# Patient Record
Sex: Female | Born: 1935 | Race: White | Hispanic: No | Smoking: Never smoker
Health system: Southern US, Community
[De-identification: ages and names within clinical notes are randomized; demographics above are authoritative.]

## PROBLEM LIST (undated history)

## (undated) DIAGNOSIS — D649 Anemia, unspecified: Secondary | ICD-10-CM

## (undated) DIAGNOSIS — I429 Cardiomyopathy, unspecified: Secondary | ICD-10-CM

## (undated) DIAGNOSIS — I10 Essential (primary) hypertension: Secondary | ICD-10-CM

## (undated) DIAGNOSIS — A0839 Other viral enteritis: Secondary | ICD-10-CM

## (undated) DIAGNOSIS — F419 Anxiety disorder, unspecified: Secondary | ICD-10-CM

## (undated) DIAGNOSIS — C801 Malignant (primary) neoplasm, unspecified: Secondary | ICD-10-CM

## (undated) DIAGNOSIS — E785 Hyperlipidemia, unspecified: Secondary | ICD-10-CM

## (undated) DIAGNOSIS — K8020 Calculus of gallbladder without cholecystitis without obstruction: Secondary | ICD-10-CM

## (undated) DIAGNOSIS — E079 Disorder of thyroid, unspecified: Secondary | ICD-10-CM

## (undated) DIAGNOSIS — I82409 Acute embolism and thrombosis of unspecified deep veins of unspecified lower extremity: Secondary | ICD-10-CM

## (undated) DIAGNOSIS — I214 Non-ST elevation (NSTEMI) myocardial infarction: Secondary | ICD-10-CM

## (undated) DIAGNOSIS — I5022 Chronic systolic (congestive) heart failure: Secondary | ICD-10-CM

## (undated) DIAGNOSIS — E538 Deficiency of other specified B group vitamins: Secondary | ICD-10-CM

## (undated) DIAGNOSIS — K5190 Ulcerative colitis, unspecified, without complications: Secondary | ICD-10-CM

## (undated) DIAGNOSIS — I480 Paroxysmal atrial fibrillation: Secondary | ICD-10-CM

## (undated) DIAGNOSIS — B259 Cytomegaloviral disease, unspecified: Secondary | ICD-10-CM

## (undated) DIAGNOSIS — R3129 Other microscopic hematuria: Secondary | ICD-10-CM

## (undated) DIAGNOSIS — G459 Transient cerebral ischemic attack, unspecified: Secondary | ICD-10-CM

## (undated) HISTORY — PX: CARDIAC CATHETERIZATION: SHX172

## (undated) HISTORY — DX: Anxiety disorder, unspecified: F41.9

## (undated) HISTORY — DX: Chronic systolic (congestive) heart failure: I50.22

## (undated) HISTORY — DX: Transient cerebral ischemic attack, unspecified: G45.9

## (undated) HISTORY — DX: Disorder of thyroid, unspecified: E07.9

## (undated) HISTORY — DX: Anemia, unspecified: D64.9

## (undated) HISTORY — DX: Non-ST elevation (NSTEMI) myocardial infarction: I21.4

## (undated) HISTORY — DX: Cardiomyopathy, unspecified: I42.9

## (undated) HISTORY — DX: Malignant (primary) neoplasm, unspecified: C80.1

## (undated) HISTORY — PX: ABDOMINAL HYSTERECTOMY: SHX81

## (undated) HISTORY — DX: Calculus of gallbladder without cholecystitis without obstruction: K80.20

## (undated) HISTORY — DX: Hyperlipidemia, unspecified: E78.5

## (undated) HISTORY — DX: Other microscopic hematuria: R31.29

## (undated) HISTORY — DX: Acute embolism and thrombosis of unspecified deep veins of unspecified lower extremity: I82.409

## (undated) HISTORY — DX: Paroxysmal atrial fibrillation: I48.0

## (undated) HISTORY — DX: Ulcerative colitis, unspecified, without complications: K51.90

## (undated) HISTORY — DX: Essential (primary) hypertension: I10

## (undated) HISTORY — DX: Deficiency of other specified B group vitamins: E53.8

---

## 1997-05-23 HISTORY — PX: COLONOSCOPY: SHX174

## 1999-04-18 ENCOUNTER — Encounter: Payer: Self-pay | Admitting: Emergency Medicine

## 1999-04-18 ENCOUNTER — Encounter: Payer: Self-pay | Admitting: Internal Medicine

## 1999-04-18 ENCOUNTER — Inpatient Hospital Stay (HOSPITAL_COMMUNITY): Admission: EM | Admit: 1999-04-18 | Discharge: 1999-04-25 | Payer: Self-pay | Admitting: Emergency Medicine

## 1999-04-19 ENCOUNTER — Encounter: Payer: Self-pay | Admitting: Internal Medicine

## 1999-04-22 ENCOUNTER — Encounter: Payer: Self-pay | Admitting: Internal Medicine

## 1999-07-06 ENCOUNTER — Ambulatory Visit (HOSPITAL_COMMUNITY): Admission: RE | Admit: 1999-07-06 | Discharge: 1999-07-06 | Payer: Self-pay | Admitting: Psychiatry

## 2000-07-18 ENCOUNTER — Other Ambulatory Visit: Admission: RE | Admit: 2000-07-18 | Discharge: 2000-07-18 | Payer: Self-pay | Admitting: Obstetrics and Gynecology

## 2000-07-25 ENCOUNTER — Encounter: Payer: Self-pay | Admitting: Obstetrics and Gynecology

## 2000-07-25 ENCOUNTER — Ambulatory Visit (HOSPITAL_COMMUNITY): Admission: RE | Admit: 2000-07-25 | Discharge: 2000-07-25 | Payer: Self-pay | Admitting: Obstetrics and Gynecology

## 2002-09-11 ENCOUNTER — Encounter: Payer: Self-pay | Admitting: Family Medicine

## 2002-09-11 ENCOUNTER — Ambulatory Visit (HOSPITAL_COMMUNITY): Admission: RE | Admit: 2002-09-11 | Discharge: 2002-09-11 | Payer: Self-pay | Admitting: Family Medicine

## 2004-06-14 ENCOUNTER — Ambulatory Visit: Payer: Self-pay | Admitting: Family Medicine

## 2004-09-29 ENCOUNTER — Ambulatory Visit: Payer: Self-pay | Admitting: Family Medicine

## 2004-12-29 ENCOUNTER — Ambulatory Visit: Payer: Self-pay | Admitting: Family Medicine

## 2005-04-13 ENCOUNTER — Ambulatory Visit: Payer: Self-pay | Admitting: Family Medicine

## 2005-08-25 ENCOUNTER — Ambulatory Visit: Payer: Self-pay | Admitting: Family Medicine

## 2005-10-06 ENCOUNTER — Ambulatory Visit: Payer: Self-pay | Admitting: Family Medicine

## 2006-02-07 ENCOUNTER — Ambulatory Visit: Payer: Self-pay | Admitting: Family Medicine

## 2006-07-31 ENCOUNTER — Emergency Department (HOSPITAL_COMMUNITY): Admission: EM | Admit: 2006-07-31 | Discharge: 2006-07-31 | Payer: Self-pay | Admitting: Emergency Medicine

## 2006-11-08 ENCOUNTER — Ambulatory Visit: Payer: Self-pay | Admitting: Family Medicine

## 2006-11-08 LAB — CONVERTED CEMR LAB
AST: 26 units/L (ref 0–37)
Bilirubin, Direct: 0.2 mg/dL (ref 0.0–0.3)
Eosinophils Absolute: 0.2 10*3/uL (ref 0.0–0.6)
Eosinophils Relative: 3 % (ref 0.0–5.0)
GFR calc Af Amer: 77 mL/min
GFR calc non Af Amer: 64 mL/min
Glucose, Bld: 126 mg/dL — ABNORMAL HIGH (ref 70–99)
HCT: 36.9 % (ref 36.0–46.0)
Hgb A1c MFr Bld: 6.5 % — ABNORMAL HIGH (ref 4.6–6.0)
Lymphocytes Relative: 19.8 % (ref 12.0–46.0)
MCV: 86.6 fL (ref 78.0–100.0)
Neutro Abs: 5.6 10*3/uL (ref 1.4–7.7)
Neutrophils Relative %: 70.4 % (ref 43.0–77.0)
Sodium: 143 meq/L (ref 135–145)
TSH: 0.68 microintl units/mL (ref 0.35–5.50)
WBC: 7.8 10*3/uL (ref 4.5–10.5)

## 2006-11-10 DIAGNOSIS — I10 Essential (primary) hypertension: Secondary | ICD-10-CM | POA: Insufficient documentation

## 2006-11-10 DIAGNOSIS — D649 Anemia, unspecified: Secondary | ICD-10-CM | POA: Insufficient documentation

## 2006-11-10 DIAGNOSIS — E785 Hyperlipidemia, unspecified: Secondary | ICD-10-CM | POA: Insufficient documentation

## 2006-11-10 DIAGNOSIS — E039 Hypothyroidism, unspecified: Secondary | ICD-10-CM | POA: Insufficient documentation

## 2007-04-02 ENCOUNTER — Ambulatory Visit: Payer: Self-pay | Admitting: Family Medicine

## 2007-04-25 ENCOUNTER — Telehealth: Payer: Self-pay | Admitting: Family Medicine

## 2007-06-19 ENCOUNTER — Ambulatory Visit: Payer: Self-pay | Admitting: Family Medicine

## 2007-08-13 ENCOUNTER — Ambulatory Visit: Payer: Self-pay | Admitting: Family Medicine

## 2007-09-10 ENCOUNTER — Emergency Department (HOSPITAL_COMMUNITY): Admission: EM | Admit: 2007-09-10 | Discharge: 2007-09-10 | Payer: Self-pay | Admitting: Emergency Medicine

## 2007-09-28 ENCOUNTER — Telehealth: Payer: Self-pay | Admitting: Family Medicine

## 2007-10-08 ENCOUNTER — Ambulatory Visit: Payer: Self-pay

## 2007-10-08 ENCOUNTER — Encounter (INDEPENDENT_AMBULATORY_CARE_PROVIDER_SITE_OTHER): Payer: Self-pay | Admitting: Neurology

## 2007-10-25 ENCOUNTER — Encounter: Admission: RE | Admit: 2007-10-25 | Discharge: 2007-10-25 | Payer: Self-pay | Admitting: Neurology

## 2007-12-04 ENCOUNTER — Telehealth: Payer: Self-pay | Admitting: Family Medicine

## 2007-12-26 ENCOUNTER — Ambulatory Visit: Payer: Self-pay | Admitting: Family Medicine

## 2007-12-26 DIAGNOSIS — I4891 Unspecified atrial fibrillation: Secondary | ICD-10-CM

## 2007-12-26 DIAGNOSIS — G459 Transient cerebral ischemic attack, unspecified: Secondary | ICD-10-CM | POA: Insufficient documentation

## 2007-12-26 DIAGNOSIS — I509 Heart failure, unspecified: Secondary | ICD-10-CM | POA: Insufficient documentation

## 2007-12-26 DIAGNOSIS — G7000 Myasthenia gravis without (acute) exacerbation: Secondary | ICD-10-CM | POA: Insufficient documentation

## 2007-12-26 DIAGNOSIS — F411 Generalized anxiety disorder: Secondary | ICD-10-CM | POA: Insufficient documentation

## 2008-01-23 ENCOUNTER — Telehealth: Payer: Self-pay | Admitting: Family Medicine

## 2008-02-08 ENCOUNTER — Ambulatory Visit: Payer: Self-pay | Admitting: Family Medicine

## 2008-02-26 ENCOUNTER — Telehealth: Payer: Self-pay | Admitting: Family Medicine

## 2008-04-09 ENCOUNTER — Encounter: Payer: Self-pay | Admitting: Family Medicine

## 2008-06-25 ENCOUNTER — Ambulatory Visit: Payer: Self-pay | Admitting: Family Medicine

## 2008-09-05 ENCOUNTER — Telehealth: Payer: Self-pay | Admitting: Family Medicine

## 2008-10-10 ENCOUNTER — Ambulatory Visit: Payer: Self-pay | Admitting: Family Medicine

## 2008-10-14 ENCOUNTER — Encounter: Payer: Self-pay | Admitting: Family Medicine

## 2008-12-18 ENCOUNTER — Encounter: Admission: RE | Admit: 2008-12-18 | Discharge: 2008-12-18 | Payer: Self-pay | Admitting: Family Medicine

## 2009-02-04 ENCOUNTER — Telehealth: Payer: Self-pay | Admitting: Family Medicine

## 2009-02-17 ENCOUNTER — Ambulatory Visit: Payer: Self-pay | Admitting: Family Medicine

## 2009-03-11 ENCOUNTER — Encounter: Payer: Self-pay | Admitting: Family Medicine

## 2009-04-07 ENCOUNTER — Encounter (INDEPENDENT_AMBULATORY_CARE_PROVIDER_SITE_OTHER): Payer: Self-pay | Admitting: *Deleted

## 2009-04-10 ENCOUNTER — Encounter (INDEPENDENT_AMBULATORY_CARE_PROVIDER_SITE_OTHER): Payer: Self-pay | Admitting: *Deleted

## 2009-04-15 ENCOUNTER — Encounter (INDEPENDENT_AMBULATORY_CARE_PROVIDER_SITE_OTHER): Payer: Self-pay | Admitting: *Deleted

## 2009-04-29 ENCOUNTER — Ambulatory Visit: Payer: Self-pay | Admitting: Family Medicine

## 2009-05-05 LAB — CONVERTED CEMR LAB: TSH: 4.28 microintl units/mL (ref 0.35–5.50)

## 2009-06-05 ENCOUNTER — Telehealth: Payer: Self-pay | Admitting: Family Medicine

## 2009-07-27 ENCOUNTER — Telehealth: Payer: Self-pay | Admitting: Family Medicine

## 2009-08-04 ENCOUNTER — Ambulatory Visit: Payer: Self-pay | Admitting: Family Medicine

## 2009-09-22 ENCOUNTER — Ambulatory Visit: Payer: Self-pay | Admitting: Family Medicine

## 2009-12-02 ENCOUNTER — Ambulatory Visit: Payer: Self-pay | Admitting: Family Medicine

## 2010-01-27 ENCOUNTER — Telehealth: Payer: Self-pay | Admitting: Family Medicine

## 2010-02-22 ENCOUNTER — Ambulatory Visit: Payer: Self-pay | Admitting: Family Medicine

## 2010-02-22 ENCOUNTER — Encounter: Payer: Self-pay | Admitting: Family Medicine

## 2010-02-22 DIAGNOSIS — E538 Deficiency of other specified B group vitamins: Secondary | ICD-10-CM

## 2010-02-25 LAB — CONVERTED CEMR LAB
ALT: 23 units/L (ref 0–35)
AST: 23 units/L (ref 0–37)
BUN: 20 mg/dL (ref 6–23)
Basophils Relative: 0.1 % (ref 0.0–3.0)
Chloride: 104 meq/L (ref 96–112)
Eosinophils Relative: 2.4 % (ref 0.0–5.0)
GFR calc non Af Amer: 58.89 mL/min (ref >60)
HCT: 34 % — ABNORMAL LOW (ref 36.0–46.0)
HDL: 41 mg/dL (ref >39.00)
Hemoglobin: 11.7 g/dL — ABNORMAL LOW (ref 12.0–15.0)
Lymphs Abs: 1.2 10*3/uL (ref 0.7–4.0)
MCV: 88 fL (ref 78.0–100.0)
Monocytes Absolute: 0.5 10*3/uL (ref 0.1–1.0)
Monocytes Relative: 8 % (ref 3.0–12.0)
Neutro Abs: 4.2 10*3/uL (ref 1.4–7.7)
Potassium: 4.3 meq/L (ref 3.5–5.1)
RBC: 3.86 M/uL — ABNORMAL LOW (ref 3.87–5.11)
Sodium: 141 meq/L (ref 135–145)
TSH: 3.15 microintl units/mL (ref 0.35–5.50)
Total Bilirubin: 1.1 mg/dL (ref 0.3–1.2)
Total Protein: 6.7 g/dL (ref 6.0–8.3)
VLDL: 58.4 mg/dL — ABNORMAL HIGH (ref 0.0–40.0)
Vitamin B-12: >1500 pg/mL — ABNORMAL HIGH (ref 211–911)
WBC: 6 10*3/uL (ref 4.5–10.5)

## 2010-03-31 ENCOUNTER — Ambulatory Visit: Payer: Self-pay | Admitting: Family Medicine

## 2010-04-20 ENCOUNTER — Encounter: Payer: Self-pay | Admitting: Family Medicine

## 2010-04-30 ENCOUNTER — Encounter
Admission: RE | Admit: 2010-04-30 | Discharge: 2010-04-30 | Payer: Self-pay | Source: Home / Self Care | Attending: Family Medicine | Admitting: Family Medicine

## 2010-04-30 ENCOUNTER — Encounter: Payer: Self-pay | Admitting: Family Medicine

## 2010-05-05 ENCOUNTER — Telehealth: Payer: Self-pay | Admitting: Family Medicine

## 2010-06-19 ENCOUNTER — Encounter: Payer: Self-pay | Admitting: Family Medicine

## 2010-06-20 LAB — CONVERTED CEMR LAB
ALT: 22 units/L (ref 0–35)
ALT: 26 units/L (ref 0–35)
AST: 24 units/L (ref 0–37)
AST: 27 units/L (ref 0–37)
Albumin: 3.7 g/dL (ref 3.5–5.2)
BUN: 10 mg/dL (ref 6–23)
Basophils Absolute: 0 10*3/uL (ref 0.0–0.1)
Basophils Relative: 0.2 % (ref 0.0–3.0)
Basophils Relative: 0.4 % (ref 0.0–3.0)
Bilirubin, Direct: 0.1 mg/dL (ref 0.0–0.3)
CO2: 25 meq/L (ref 19–32)
CO2: 30 meq/L (ref 19–32)
Calcium: 9.4 mg/dL (ref 8.4–10.5)
Calcium: 9.5 mg/dL (ref 8.4–10.5)
Cholesterol: 126 mg/dL (ref 0–200)
Creatinine, Ser: 0.9 mg/dL (ref 0.4–1.2)
Creatinine, Ser: 0.9 mg/dL (ref 0.4–1.2)
Eosinophils Relative: 2.9 % (ref 0.0–5.0)
Eosinophils Relative: 3 % (ref 0.0–5.0)
GFR calc non Af Amer: 63.6 mL/min (ref >60)
Glucose, Bld: 106 mg/dL — ABNORMAL HIGH (ref 70–99)
Glucose, Bld: 120 mg/dL — ABNORMAL HIGH (ref 70–99)
HCT: 33 % — ABNORMAL LOW (ref 36.0–46.0)
Hemoglobin: 11.8 g/dL — ABNORMAL LOW (ref 12.0–15.0)
LDL Cholesterol: 63 mg/dL (ref 0–99)
Lymphocytes Relative: 18.6 % (ref 12.0–46.0)
Lymphs Abs: 1.1 10*3/uL (ref 0.7–4.0)
MCHC: 34.2 g/dL (ref 30.0–36.0)
MCV: 90 fL (ref 78.0–100.0)
Monocytes Absolute: 0.4 10*3/uL (ref 0.1–1.0)
Monocytes Relative: 7.7 % (ref 3.0–12.0)
Neutro Abs: 4.9 10*3/uL (ref 1.4–7.7)
Pro B Natriuretic peptide (BNP): 120 pg/mL — ABNORMAL HIGH (ref 0.0–100.0)
RBC: 3.67 M/uL — ABNORMAL LOW (ref 3.87–5.11)
RBC: 4.01 M/uL (ref 3.87–5.11)
Sodium: 145 meq/L (ref 135–145)
TSH: 11.32 microintl units/mL — ABNORMAL HIGH (ref 0.35–5.50)
Total CHOL/HDL Ratio: 4
Total Protein: 6.4 g/dL (ref 6.0–8.3)
Total Protein: 6.6 g/dL (ref 6.0–8.3)
Vitamin B-12: 169 pg/mL — ABNORMAL LOW (ref 211–911)
WBC: 5.3 10*3/uL (ref 4.5–10.5)
WBC: 7 10*3/uL (ref 4.5–10.5)

## 2010-06-22 NOTE — Progress Notes (Signed)
Summary: [facility]   Imported By: Sherian Rein 10/31/2009 08:52:35  _____________________________________________________________________  External Attachment:    Type:   Image     Comment:   External Document

## 2010-06-22 NOTE — Progress Notes (Signed)
Summary: refill alprazolam  Phone Note From Pharmacy   Caller: [facility] [address] [address]* Summary of Call: refill alprazolam 1mg   Initial call taken by: Pura Spice, RN,  January 27, 2010 12:51 PM  Follow-up for Phone Call        call in #90 with 5 rf Follow-up by: Nelwyn Salisbury MD,  January 27, 2010 2:38 PM    Prescriptions: Prudy Feeler 0.5 MG TABS (ALPRAZOLAM) Take 1 tablet by mouth three times a day  #90 x 5   Entered by:   Lucious Groves CMA   Authorized by:   Nelwyn Salisbury MD   Signed by:   Lucious Groves CMA on 01/27/2010   Method used:   Telephoned to ...       [facility] [address] (retail)       [address] [place], [place]  [address]       Ph: [phone]       Fax: [phone]   RxID:   [id number]   Appended Document: refill alprazolam  1 mg  ok  [facility] and [facility] to [facility] and alprazolam 1mg  #90 with 5 refills called also informed her to disregard and not fill the 0.5mg  alopazolam . ..Marland Kitchengh rn.......Marland Kitchen

## 2010-06-22 NOTE — Assessment & Plan Note (Signed)
Summary: B-12 INJ/CJR   Nurse Visit   Allergies: 1)  Lasix (Furosemide) 2)  Sulfamethoxazole (Sulfamethoxazole)  Medication Administration  Injection # 1:    Medication: Vit B12 1000 mcg    Diagnosis: ANEMIA-NOS (ICD-285.9)    Route: IM    Site: L deltoid    Exp Date: 09/12    Lot #: [id number]    Mfr: [employer]    Patient tolerated injection without complications    Given by: Raechel Ache, RN (Sep 22, 2009 1:49 PM)  Orders Added: 1)  Vit B12 1000 mcg [J3420] 2)  Admin of Therapeutic Inj  intramuscular or subcutaneous [96045]

## 2010-06-22 NOTE — Assessment & Plan Note (Signed)
Summary: B12 INJ//CCM   Nurse Visit   Allergies: 1)  Lasix (Furosemide) 2)  Sulfamethoxazole (Sulfamethoxazole)  Medication Administration  Injection # 1:    Medication: Vit B12 1000 mcg    Diagnosis: ANEMIA-NOS (ICD-285.9)    Route: IM    Site: L deltoid    Exp Date: 09/12    Lot #: [id number]    Mfr: [employer]    Patient tolerated injection without complications    Given by: Raechel Ache, RN (August 04, 2009 1:52 PM)  Orders Added: 1)  Vit B12 1000 mcg [J3420] 2)  Admin of Therapeutic Inj  intramuscular or subcutaneous [96045]

## 2010-06-22 NOTE — Progress Notes (Signed)
Summary: Alprazolam  Phone Note From Pharmacy   Summary of Call: Pharmacy called stating that patient has been on 1mg  of Alprazolam. We called in 0.5mg  b/c that is what was in the chart. Should the patient receive 0.5mg  or 1mg . Please advise. Initial call taken by: Lucious Groves CMA,  January 27, 2010 3:22 PM  Follow-up for Phone Call        call in a full 1 mg as in my note please Follow-up by: Nelwyn Salisbury MD,  January 27, 2010 4:56 PM  Additional Follow-up for Phone Call Additional follow up Details #1::        done yest pt aware Additional Follow-up by: Pura Spice, RN,  January 28, 2010 8:47 AM

## 2010-06-22 NOTE — Assessment & Plan Note (Signed)
Summary: b12 inj/njr   Nurse Visit   Allergies: 1)  Lasix (Furosemide) 2)  Sulfamethoxazole (Sulfamethoxazole)  Medication Administration  Injection # 1:    Medication: Vit B12 1000 mcg    Diagnosis: VITAMIN B12 DEFICIENCY (ICD-266.2)    Route: IM    Site: L deltoid    Exp Date: 11/2011    Lot #: [id number]    Mfr: [employer]    Patient tolerated injection without complications    Given by: Pura Spice, RN (March 31, 2010 3:03 PM)  Orders Added: 1)  Vit B12 1000 mcg [J3420] 2)  Admin of Therapeutic Inj  intramuscular or subcutaneous [56213]

## 2010-06-22 NOTE — Assessment & Plan Note (Signed)
Summary: b 12 inj/njr   Nurse Visit   Allergies: 1)  Lasix (Furosemide) 2)  Sulfamethoxazole (Sulfamethoxazole)  Medication Administration  Injection # 1:    Medication: Vit B12 1000 mcg    Diagnosis: ANEMIA-NOS (ICD-285.9)    Route: IM    Site: L deltoid    Exp Date: 02/13    Lot #: [id number]    Mfr: [employer]    Patient tolerated injection without complications    Given by: Raechel Ache, RN (December 02, 2009 1:53 PM)  Orders Added: 1)  Vit B12 1000 mcg [J3420] 2)  Admin of Therapeutic Inj  intramuscular or subcutaneous [[address]]

## 2010-06-22 NOTE — Progress Notes (Signed)
Summary: REFILL  Phone Note Refill Request Message from:  Fax from Pharmacy  Refills Requested: Medication #1:  LIPITOR 20 MG TABS Take 1 tablet by mouth once a day [facility] Fransisca Kaufmann [id number]     [phone]  Initial call taken by: Warnell Forester,  July 27, 2009 9:14 AM    Prescriptions: LIPITOR 20 MG TABS (ATORVASTATIN CALCIUM) Take 1 tablet by mouth once a day  #30 Tablet x 10   Entered by:   Raechel Ache, RN   Authorized by:   Nelwyn Salisbury MD   Signed by:   Raechel Ache, RN on 07/27/2009   Method used:   Electronically to        [facility] [address] (retail)       [address] [place], [place]  [address]       Ph: [phone]       Fax: [phone]   RxID:   [id number]

## 2010-06-22 NOTE — Assessment & Plan Note (Signed)
Summary: CPX (PT WILL COME IN FASTING) // RS   Vital Signs:  Patient profile:   77 year old female Height:      65 inches Weight:      231 pounds BMI:     38.58 O2 Sat:      96 % Temp:     98.2 degrees F Pulse rate:   82 / minute BP sitting:   140 / 82  (left arm) Cuff size:   large  Vitals Entered By: Pura Spice, RN (February 22, 2010 1:56 PM)  History of Present Illness: 77 yr old female for a cpx. She feels fine in general with no complaints.   Allergies: 1)  Lasix (Furosemide) 2)  Sulfamethoxazole (Sulfamethoxazole)  Past History:  Past Medical History: Reviewed history from 04/29/2009 and no changes required. Anemia-NOS Hyperlipidemia Hypertension Hypothyroidism Myasthenia gravis, sees Dr. Thad Ranger Anxiety paroxysmal Atrial fibrillation B12 deficiency Congestive heart failure TIA in 4-09 benign microscopic hematuria, worked up by Dr. 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 Birdie Sons  Past Surgical History: Reviewed history from 12/26/2007 and no changes required. cardiac cath  12/00 Hysterectomy Oophorectomy colonoscopy 1999, normal  Family History: Reviewed history from 12/26/2007 and no changes required. unremarkable, her family lives in [place]  Social History: Reviewed history from 12/26/2007 and no changes required. Widow/Widower Never Smoked Alcohol use-yes Drug use-no  Review of Systems  The patient denies anorexia, fever, weight loss, weight gain, vision loss, decreased hearing, hoarseness, chest pain, syncope, dyspnea on exertion, peripheral edema, prolonged cough, headaches, hemoptysis, abdominal pain, melena, hematochezia, severe indigestion/heartburn, hematuria, incontinence, genital sores, muscle weakness, suspicious skin lesions, transient blindness, difficulty walking, depression, unusual weight change, abnormal bleeding, enlarged lymph nodes, angioedema, breast masses, and testicular  masses.         Flu Vaccine Consent Questions     Do you have a history of severe allergic reactions to this vaccine? no    Any prior history of allergic reactions to egg and/or gelatin? no    Do you have a sensitivity to the preservative Thimersol? no    Do you have a past history of Guillan-Barre Syndrome? no    Do you currently have an acute febrile illness? no    Have you ever had a severe reaction to latex? no    Vaccine information given and explained to patient? yes    Are you currently pregnant? no    Lot Number:[id number]   Exp Date:11/20/2010   Site Given  Left Deltoid IM Pura Spice, RN  February 22, 2010 1:57 PM   Physical Exam  General:  overweight-appearing.  uses a rolling walker Head:  Normocephalic and atraumatic without obvious abnormalities. No apparent alopecia or balding. Eyes:  No corneal or conjunctival inflammation noted. EOMI. Perrla. Funduscopic exam benign, without hemorrhages, exudates or papilledema. Vision grossly normal. Ears:  External ear exam shows no significant lesions or deformities.  Otoscopic examination reveals clear canals, tympanic membranes are intact bilaterally without bulging, retraction, inflammation or discharge. Hearing is grossly normal bilaterally. Nose:  External nasal examination shows no deformity or inflammation. Nasal mucosa are pink and moist without lesions or exudates. Mouth:  Oral mucosa and oropharynx without lesions or exudates.  Teeth in good repair. Neck:  No deformities, masses, or tenderness noted. Chest Wall:  No deformities, masses, or tenderness noted. Breasts:  No mass, nodules, thickening, tenderness, bulging, retraction, inflamation, nipple discharge or skin changes noted.  Lungs:  Normal respiratory effort, chest expands symmetrically. Lungs are clear to auscultation, no crackles or wheezes. Heart:  Normal rate and regular rhythm. S1 and S2 normal without gallop, murmur, click, rub or other extra sounds. EKG  normal Abdomen:  Bowel sounds positive,abdomen soft and non-tender without masses, organomegaly or hernias noted. Msk:  No deformity or scoliosis noted of thoracic or lumbar spine.   Pulses:  R and L carotid,radial,femoral,dorsalis pedis and posterior tibial pulses are full and equal bilaterally Extremities:  No clubbing, cyanosis, edema, or deformity noted with normal full range of motion of all joints.   Neurologic:  No cranial nerve deficits noted. Station and gait are normal. Plantar reflexes are down-going bilaterally. DTRs are symmetrical throughout. Sensory, motor and coordinative functions appear intact. Skin:  Intact without suspicious lesions or rashes Cervical Nodes:  No lymphadenopathy noted Axillary Nodes:  No palpable lymphadenopathy Inguinal Nodes:  No significant adenopathy Psych:  Cognition and judgment appear intact. Alert and cooperative with normal attention span and concentration. No apparent delusions, illusions, hallucinations   Impression & Recommendations:  Problem # 1:  MYASTHENIA GRAVIS WITHOUT EXACERBATION (ICD-358.00)  Problem # 2:  CONGESTIVE HEART FAILURE (ICD-428.0)  Her updated medication list for this problem includes:    Plavix 75 Mg Tabs (Clopidogrel bisulfate) .Marland Kitchen... Take 1 tab by mouth daily    Avalide 300-12.5 Mg Tabs (Irbesartan-hydrochlorothiazide) ..... Once daily  Problem # 3:  ATRIAL FIBRILLATION (ICD-427.31)  Her updated medication list for this problem includes:    Plavix 75 Mg Tabs (Clopidogrel bisulfate) .Marland Kitchen... Take 1 tab by mouth daily  Problem # 4:  HYPOTHYROIDISM (ICD-244.9)  Her updated medication list for this problem includes:    Levoxyl 125 Mcg Tabs (Levothyroxine sodium) .Marland Kitchen... Take 1 tablet by mouth once a day  Problem # 5:  HYPERTENSION (ICD-401.9)  Her updated medication list for this problem includes:    Avalide 300-12.5 Mg Tabs (Irbesartan-hydrochlorothiazide) ..... Once daily  Orders: EKG w/ Interpretation (93000) UA  Dipstick w/Micro (automated) (81001) Venipuncture (56213) TLB-Lipid Panel (80061-LIPID) TLB-BMP (Basic Metabolic Panel-BMET) (80048-METABOL) TLB-CBC Platelet - w/Differential (85025-CBCD) TLB-Hepatic/Liver Function Pnl (80076-HEPATIC) TLB-TSH (Thyroid Stimulating Hormone) (84443-TSH)  Problem # 6:  HYPERLIPIDEMIA (ICD-272.4)  Her updated medication list for this problem includes:    Lipitor 20 Mg Tabs (Atorvastatin calcium) .Marland Kitchen... Take 1 tablet by mouth once a day  Problem # 7:  VITAMIN B12 DEFICIENCY (ICD-266.2)  Orders: TLB-B12, Serum-Total ONLY (08657-Q46)  Complete Medication List: 1)  Cellcept 500 Mg Tabs (Mycophenolate mofetil) .... Take 2 twice a day 2)  Cyanocobalamin 1000 Mcg/ml Soln (Cyanocobalamin) .... Give once a month 3)  Levoxyl 125 Mcg Tabs (Levothyroxine sodium) .... Take 1 tablet by mouth once a day 4)  Lipitor 20 Mg Tabs (Atorvastatin calcium) .... Take 1 tablet by mouth once a day 5)  Plavix 75 Mg Tabs (Clopidogrel bisulfate) .... Take 1 tab by mouth daily 6)  Avalide 300-12.5 Mg Tabs (Irbesartan-hydrochlorothiazide) .... Once daily 7)  Alprazolam 1 Mg Tabs (Alprazolam) .Marland Kitchen.. 1 by mouth three times a day  Other Orders: Flu Vaccine 27yrs + MEDICARE PATIENTS (N6295) Administration Flu vaccine - MCR (G0008) Vit B12 1000 mcg (J3420) Admin of Therapeutic Inj  intramuscular or subcutaneous (28413)  Patient Instructions: 1)  It is important that you exercise reguarly at least 20 minutes 5 times a week. If you develop chest pain, have severe difficulty breathing, or feel very tired, stop exercising immediately and seek medical attention.  2)  You need to lose weight.  Consider a lower calorie diet and regular exercise.  3)  Schedule your mammogram.  4)  get labs today  5)  get a DEXA Prescriptions: AVALIDE 300-12.5 MG TABS (IRBESARTAN-HYDROCHLOROTHIAZIDE) once daily  #30 x 11   Entered and Authorized by:   Nelwyn Salisbury MD   Signed by:   Nelwyn Salisbury MD on  02/22/2010   Method used:   Electronically to        [facility] [address] (retail)       [address] [place], [place]  [address]       Ph: [phone]       Fax: [phone]   RxID:   [id number] LEVOXYL 125 MCG  TABS (LEVOTHYROXINE SODIUM) Take 1 tablet by mouth once a day  #30 x 11   Entered and Authorized by:   Nelwyn Salisbury MD   Signed by:   Nelwyn Salisbury MD on 02/22/2010   Method used:   Electronically to        [facility] [address] (retail)       [address] [place], [place]  [address]       Ph: [phone]       Fax: [phone]   RxID:   [id number]    Medication Administration  Injection # 1:    Medication: Vit B12 1000 mcg    Diagnosis: ANEMIA-NOS (ICD-285.9)    Route: IM    Site: R deltoid    Exp Date: 08/2011    Lot #: [id number]    Mfr: [employer]    Patient tolerated injection without complications    Given by: Pura Spice, RN (February 22, 2010 1:59 PM)  Orders Added: 1)  Flu Vaccine 27yrs + MEDICARE PATIENTS [Q2039] 2)  Administration Flu vaccine - MCR [G0008] 3)  Vit B12 1000 mcg [J3420] 4)  Admin of Therapeutic Inj  intramuscular or subcutaneous [96372] 5)  Est. Patient Level IV [63016] 6)  EKG w/ Interpretation [93000] 7)  UA Dipstick w/Micro (automated) [81001] 8)  Venipuncture [36415] 9)  TLB-Lipid Panel [80061-LIPID] 10)  TLB-BMP (Basic Metabolic Panel-BMET) [80048-METABOL] 11)  TLB-CBC Platelet - w/Differential [85025-CBCD] 12)  TLB-Hepatic/Liver Function Pnl [80076-HEPATIC] 13)  TLB-TSH (Thyroid Stimulating Hormone) [84443-TSH] 14)  TLB-B12, Serum-Total ONLY [82607-B12]    Appended Document: Orders Update     Clinical Lists Changes  Orders: Added new Service order of Specimen Handling (01093) - Signed      Appended Document: CPX (PT WILL COME IN FASTING) // RS  Laboratory Results   Urine Tests    Routine Urinalysis    Color: yellow Appearance: Clear Glucose: negative   (Normal Range: Negative) Bilirubin: negative   (Normal Range: Negative) Ketone: negative   (Normal Range: Negative) Spec. Gravity: <1.005   (Normal Range: 1.003-1.035) Blood: 2+   (Normal Range: Negative) pH: 5.0   (Normal Range: 5.0-8.0) Protein: negative   (Normal Range: Negative) Urobilinogen: 0.2   (Normal Range: 0-1) Nitrite: negative   (Normal Range: Negative) Leukocyte Esterace: trace   (Normal Range: Negative)    Comments: Rita Ohara  February 22, 2010 3:05 PM

## 2010-06-22 NOTE — Progress Notes (Signed)
Summary: refill xanax  Phone Note From Pharmacy   Caller: [facility] [address] [address]* Call For: Dorse Locy  Summary of Call: refill xanax 1mg  1 by mouth three times a day Initial call taken by: Alfred Levins, CMA,  June 05, 2009 2:36 PM  Follow-up for Phone Call        she should have refills available from 01-2009 Follow-up by: Nelwyn Salisbury MD,  June 05, 2009 3:56 PM  Additional Follow-up for Phone Call Additional follow up Details #1::        last refill was on 05/01/09 Additional Follow-up by: Alfred Levins, CMA,  June 05, 2009 4:02 PM    Additional Follow-up for Phone Call Additional follow up Details #2::    okay. Call in #90 with 5 rf Follow-up by: Nelwyn Salisbury MD,  June 05, 2009 4:10 PM  Additional Follow-up for Phone Call Additional follow up Details #3:: Details for Additional Follow-up Action Taken: rx called in Additional Follow-up by: Alfred Levins, CMA,  June 05, 2009 4:15 PM

## 2010-06-24 NOTE — Progress Notes (Signed)
Summary: returning a call  Phone Note Call from Patient Call back at Home Phone [phone]   Caller: Patient---triage vm Summary of Call: returning a call. Initial call taken by: Warnell Forester,  May 05, 2010 1:03 PM    New/Updated Medications: ACTONEL 150 MG TABS (RISEDRONATE SODIUM) one pill monthly Prescriptions: ACTONEL 150 MG TABS (RISEDRONATE SODIUM) one pill monthly  #4 x 3   Entered by:   Lynann Beaver CMA AAMA   Authorized by:   Nelwyn Salisbury MD   Signed by:   Lynann Beaver CMA AAMA on 05/05/2010   Method used:   Electronically to        [facility] [address] (retail)       [address] [place], [place]  [address]       Ph: [phone]       Fax: [phone]   RxID:   [id number]  Pt notified.

## 2010-06-29 ENCOUNTER — Ambulatory Visit (INDEPENDENT_AMBULATORY_CARE_PROVIDER_SITE_OTHER): Payer: Medicare Other | Admitting: Family Medicine

## 2010-06-29 DIAGNOSIS — D519 Vitamin B12 deficiency anemia, unspecified: Secondary | ICD-10-CM

## 2010-06-29 DIAGNOSIS — D518 Other vitamin B12 deficiency anemias: Secondary | ICD-10-CM

## 2010-06-29 MED ORDER — CYANOCOBALAMIN 1000 MCG/ML IJ SOLN
1000.0000 ug | INTRAMUSCULAR | Status: DC
  Administered 2010-06-29 – 2011-08-01 (×2): 1000 ug via INTRAMUSCULAR

## 2010-08-02 ENCOUNTER — Ambulatory Visit (INDEPENDENT_AMBULATORY_CARE_PROVIDER_SITE_OTHER): Payer: Medicare Other | Admitting: Family Medicine

## 2010-08-02 DIAGNOSIS — D519 Vitamin B12 deficiency anemia, unspecified: Secondary | ICD-10-CM

## 2010-08-02 DIAGNOSIS — D518 Other vitamin B12 deficiency anemias: Secondary | ICD-10-CM

## 2010-08-02 MED ORDER — CYANOCOBALAMIN 1000 MCG/ML IJ SOLN
1000.0000 ug | Freq: Once | INTRAMUSCULAR | Status: AC
  Administered 2010-08-02: 1000 ug via INTRAMUSCULAR

## 2010-09-06 ENCOUNTER — Ambulatory Visit (INDEPENDENT_AMBULATORY_CARE_PROVIDER_SITE_OTHER): Payer: Medicare Other | Admitting: Family Medicine

## 2010-09-06 DIAGNOSIS — D518 Other vitamin B12 deficiency anemias: Secondary | ICD-10-CM

## 2010-09-06 DIAGNOSIS — D519 Vitamin B12 deficiency anemia, unspecified: Secondary | ICD-10-CM

## 2010-09-06 MED ORDER — CYANOCOBALAMIN 1000 MCG/ML IJ SOLN
1000.0000 ug | Freq: Once | INTRAMUSCULAR | Status: AC
  Administered 2010-09-06: 1000 ug via INTRAMUSCULAR

## 2010-09-24 ENCOUNTER — Other Ambulatory Visit: Payer: Self-pay

## 2010-09-24 MED ORDER — ATORVASTATIN CALCIUM 20 MG PO TABS: 20.0000 mg | ORAL_TABLET | Freq: Every day | ORAL | Status: DC

## 2010-09-24 NOTE — Telephone Encounter (Signed)
rx sent to [facility] [place] for atorvastatin 20 mg

## 2010-10-05 NOTE — Assessment & Plan Note (Signed)
[facility] OFFICE NOTE   [name], [name]                    MRN:          [id number]  DATE:11/08/2006                            DOB:          September 14, 1924    This is an 77 year old woman here for a complete physical examination.  In general, she is doing fairly well, but I think medications may need  to be adjusted.  Over the past 6 months, her blood pressure has been  running a little higher than usual, although she has felt reasonably  well.  She also has not had a B12 shot in the past 3 months and realizes  she needs to get back on a regular regimen.  Also, she has been on Xanax  for years for anxiety.  Over the last year, it seems not to work as well  as it used to.  She has had some increased episodes of anxiety lately,  although she has been sleeping fairly well.  As noted in her chart, she  has a history of paroxysmal atrial fibrillation for many years.  She is  never symptomatic with it, however.  She specifically denies shortness  of breath, chest pain, or palpitations.  For other details of her past  medical history, family history, social history, habits, [name], I  refer you to our last physical note dated Sep 29, 2004.   ALLERGIES:  LASIX and SULFA.   CURRENT MEDICATIONS:  1. B12 shots every 1 to 2 months.  2. Levoxyl 175 mcg per day.  3. Cellcept 1000 mg b.i.d.  4. Lipitor 20 mg per day.  5. Avapro 150 mg per day.  6. Xanax 0.5 mg t.i.d.  7. Aspirin 81 mg per day.   She continues to follow up with [facility] with [facility] for gynecology exams, and with Dr. Thad Ranger for care of her  myasthenia gravis.   OBJECTIVE:  Weight 237 pounds, BP 128/98.  Pulse 72 and irregularly  irregular.  Respirations 12 and comfortable.  GENERAL:  She is obese, as usual.  SKIN:  Clear.  EYES:  Clear.  She wears glasses.  EARS:  Clear.  OROPHARYNX:  Clear.  NECK:  Supple without  lymphadenopathy or masses.  LUNGS:  Clear.  CARDIAC:  Rate is regular.  Rhythm is irregularly irregular without  gallops, murmurs, or rubs.  Distal pulses are full.  EKG shows atrial  fibrillation.  ABDOMEN:  Soft, normal bowel sounds.  Nontender.  No masses with the  exception of a small, reducible nontender umbilical hernia.  EXTREMITIES:  No cyanosis, clubbing, or edema.  NEUROLOGIC:  Grossly intact.   ASSESSMENT AND PLAN:  Problem #1:  Complete physical exam.  I encouraged  her to get her regular mammogram and to try to get as much exercise as  she could to keep her weight down.  She is fasting today, so we will  send her for the usual laboratories.  Problem #2:  Hypertension.  We will switch to Avalide 150/12.5 to take  once daily.  Problem #3:  Hyperlipidemia.  We will check a  fasting lipid panel today.  Problem #4:  Anxiety.  We will increase Xanax to 1 mg t.i.d.  I wrote  for #90 with 11 refills.  Problem #5:  Chronic atrial fibrillation.  She will continue with her  aspirin therapy since she has been stable for a number of years.  Problem #6:  Anemia.  She was given a B12 shot today, but prior to that,  we did check a serum B12 level.  Problem #7:  Myasthenia gravis.  She will follow up with Dr. Thad Ranger.  Problem #8:  Hypothyroidism.  We will check a TSH.     Tera Mater. Clent Ridges, MD  Electronically Signed    SAF/MedQ  DD: 11/08/2006  DT: 11/08/2006  Job #: [id number]

## 2010-10-08 NOTE — Discharge Summary (Signed)
[name]. [facility]  Patient:    [name]                     MRN: [id number] Adm. Date:  [id number] Disc. Date: [id number] Attending:  Farley Marks Dictator:   Tawni Millers, M.D. CC:         [name], M.D.             [name], M.D.             [name], M.D.                           Discharge Summary  DATE OF BIRTH:  May 24, 1934.  DISCHARGE MEDICATIONS: 1. Zocor 40 mg one p.o. q.d. 2. Atarax 25 mg p.o. q.6h. p.r.n. itch. 3. Cardizem 120 mg one p.o. q.d. 4. Mestinon 60 mg one p.o. t.i.d. 5. Synthroid 0.175 mg one p.o. q.d. 6. Hydrocortisone cream to rash p.r.n.  DISCHARGE DIAGNOSES: 1. Paroxysmal atrial fibrillation, spontaneously converted after Cardizem. 2. Myasthenia gravis. 3. Skin rash - drug reaction. 4. Hypothyroidism. 5. Hyperlipidemia.  CONSULTANTS:  Dr. Andee Lineman of cardiology who performed the cardiac catheterization, Dr. Tomasa Rand of neurology.  PROCEDURES:  The patient had a cardiac catheterization with Dr. Andee Lineman.  This showed left main was okay, the LAD was 20% proximal obstruction, circumflex showed no obstruction, and RCA had 10% obstruction.  She had a left ventricular ejection fraction of 55%.  This is an essentially normal cardiac catheterization.  ADMISSION HISTORY:  This is a 77 year old white female originally from [place] ho has a history of myasthenia gravis diagnosed approximately six or seven years ago, along with hypertension, hypothyroidism, and dyslipidemia, who came to the ED complaining of increasing shortness of breath episodes over the past couple of weeks.  Upon admission she was found to be in atrial fibrillation with rapid ventricular response.  PHYSICAL EXAMINATION:  VITAL SIGNS:  Temperature 97.8, blood pressure 161/78, pulse 140 to 160, respirations 22, saturations are at 98% on room air.  GENERAL APPEARANCE:  She is alert and oriented in no acute  distress.  HEENT:  Poor dentition.  CHEST:  Clear to auscultation.  CARDIOVASCULAR:  Irregularly irregular rhythm with no JVD.  She had good pulses  bilaterally.  ABDOMEN:  Benign.  NEUROLOGIC:  Nonfocal.  SKIN:  No rashes or lesions.  LABORATORY AND X-RAY DATA:  She had a potassium of 3.0, sodium 138, chloride 104, CO2 of 23, BUN 13, creatinine 0.8, blood sugar 265.  White blood cell count of 14.7, hemoglobin 12.9, platelets 250 with a neutrophil count of 11.5, lymphocytes 2.5.  The rest of her liver function tests were within normal limits.  She had n ABG which showed a pH of 7.39 with a CO2 of 44 and an 02 of 90.  This was on 2.5 L per minute by nasal cannula.  PT/INR was 14.2 and 1.2.  Her cardiac enzymes showed a CK of 480 with an MB of 17.2; however, her troponin I was less than 0.03 which is negative.  Chest x-ray showed possible cardiomegaly, but otherwise unremarkable.  Her EKG showed atrial fibrillation with a rapid ventricular rate of 146 with some ST segment depression in V3 through V6.  HOSPITAL COURSE: #1 - ATRIAL FIBRILLATION WITH RAPID VENTRICULAR RESPONSE:  The patient was started on a Cardizem drip which controlled her rate and eventually she spontaneously cardioverted to a normal sinus  rhythm.  The etiology of her atrial fibrillation was unknown; therefore, cardiology was consulted.  Dr. Andee Lineman performed a cardiac catheterization which showed no CAD.  In addition, the patients TSH was within normal limits this admission.  She had had some possible CHF on admission which  resolved.  #2 - MYASTHENIA GRAVIS:  The patient was continued on her Mestinon.  However, she began to complain of trouble swallowing, and the dose was increased to 60 mg p.o. t.i.d.  Appreciate Dr. Tomasa Rand and associates consult.  #3 - SKIN RASH:  The patient developed a skin rash on November 31.  This appeared to be a drug rash, and therefore many of her unnecessary  drugs were discontinued. I suspect the culprit here was likely the Darvocet or Lasix.  The patient got relief with Atarax 25 mg p.o. q.6h. and was sent out on this.  By the time of discharge the rash has subsided greatly.  #4 - HYPERLIPIDEMIA:  This is not an active issue this admission.  #5 - HYPOTHYROIDISM:  Again, not an active issue this admission.  TSH was normal.  #6 - CONGESTIVE HEART FAILURE:  The patient was in mild CHF upon presentation presumably from the atrial fibrillation with rapid ventricular response.  This resolved after spontaneous cardioversion, and the patient was no longer short of breath upon discharge.  The patient did receive a few doses of Lasix to help her diurese.  DISCHARGE LABORATORY AND X-RAY DATA:  The patient had a chest CT which showed no pulmonary embolism.  She also had several chest x-rays which indicated mild CHF  upon admission which had resolved upon discharge.  In addition, the patient had  several chest x-rays which indicated that her mild pulmonary edema had resolved by the time of discharge.  She also had an echocardiogram which showed preserved LV function with a small posterior pericardial effusion.  Ejection fraction was normal, and there was no  valvular regurgitation.  Venous Dopplers were done which showed no evidence of DVT, SVT, or Bakers cyst.  EKG on November 26 at approximately 1600 indicated the patients rhythm had converted into normal sinus rhythm.  Otherwise, the patients hemoglobin and white blood cell count remained relatively stable.  Her urinalysis was essentially negative.  Her urine drug screen was negative.  A hemoglobin A1C was 5.3.  DISPOSITION:  When the patients rash had resolved, her shortness of breath had resolved, and her Mestinon was increased, she was sent home on the medications listed previously.  FOLLOWUP:  She was to call Dr. Andee Lineman and arrange for follow-up visit as well as  Dr.  Tomasa Rand of [facility] to arrange follow-up.  CONDITION ON DISCHARGE:  The patient understood the discharge instructions and eft the floor in stable condition. DD:  06/17/99 TD:  06/19/99 Job: [id number]

## 2010-10-08 NOTE — Consult Note (Signed)
[place]  Patient:    [name]                     MRN: [id number] Proc. Date: 04/19/99 Adm. Date:  [id number] Attending:  Farley Ly CC:         Farley Ly, M.D.f                          Consultation Report  DATE OF BIRTH:  May 24, 1934  CHIEF COMPLAINT:  Shortness of breath and history of myasthenia gravis.  HISTORY OF PRESENT ILLNESS:  The patient is a 77 year old female who states she was diagnosed with myasthenia gravis six years ago in [place] after she developed double vision and ptosis.  She states that she took the Tensilon Test with significant improvement in her ptosis and diagnosis was made by that means. She was started on Mestinon at that point and has taken 40 mg once daily and generally has had good control of her diplopia and ptosis with that medication.  She states in the last week or so, she has developed shortness of breath and felt that was  related to her myasthenia gravis.  She began increasing her Mestinon dose up to as much as 80 mg twice daily, but despite this had ongoing shortness of breath without relief.  She denies ever having any prior history of generalized myasthenia. She never had any shortness of breath, never any dysphagia, never any weakness of her extremities.  On initial evaluation, the patient was noted to be in atrial fibrillation and is now being worked up for that and for probable congestive heart failure.  REVIEW OF SYSTEMS:  The patient complains of shortness of breath, has noted increased ptosis since Mestinon has been discontinued in the hospital.  She denies any other weakness of her upper extremities, denies any difficulty with speech r swallowing.  She denies any chest pain, denies an problem with bowel or bladder  function.  The remainder of the Review of Systems is negative.  PAST MEDICAL HISTORY:   Significant for myasthenia gravis, history  of hypertension, history of hypothyroidism, history of uterine cancer.  CURRENT MEDICATIONS:  Include Levoxyl, Vasotec, Zocor, verapamil, niacin, Mestinon, though Mestinon currently is on hold.  SOCIAL HISTORY:  Negative for tobacco or alcohol use.  She recently moved to [place] from [place].  FAMILY HISTORY: Positive for heart disease and cancer.  PHYSICAL EXAMINATION:  VITAL SIGNS:  Temperature 97, blood pressure 150/80, pulse 80, respirations 20.  GENERAL:  A 77 year old female in no apparent distress.  HEENT:  Pupils reactive.  Extraocular movements appear intact.  Oropharynx is benign.  NECK:  Supple with no bruits.  HEART:  Regular rate and rhythm.  EXTREMITIES:  No clubbing, cyanosis, or edema.  NEUROLOGIC:  Mental Status: She is alert and oriented.  Speech is clear. Cranial nerve testing reveals ptosis, more obvious in the left eye than the right. Slight diplopia on left lateral gaze, otherwise intact extraocular movements.  Facial strength and sensation appear intact.  Tongue protrudes in the midline.  Jaw strength is intact.  Motor testing shows 5/5 strength in all four extremities without drift, also 5/5 strength with neck flexion and extension.  On sensory exam, she reports intact sensation to touch and pinprick in all four extremities.  Reflexes 2+.  Toes are downgoing.  Coordination intact to finger-to-nose testing.  I did not evaluate gait.  IMPRESSION:  A 77 year old female with a history of ocular myasthenia with no prior history of systemic weakness.  I think it is unlikely that her current shortness of breath is secondary to myasthenia gravis without other symptoms of generalized weakness.  RECOMMENDATIONS:  I would resume her Mestinon when felt it is comfortable to do so from a cardiac standpoint.  If the patient develops diplopia in the meantime, she can use an eye patch for comfort.  I doubt the calcium channel blockers would make a  major affect on her myasthenia.  Propranolol and antiarrhythmics such as quinidine are more problematic in general.  I would also avoid high doses of magnesium. DD:  04/19/99 TD:  04/19/99 Job: [id number]

## 2010-11-22 ENCOUNTER — Ambulatory Visit (INDEPENDENT_AMBULATORY_CARE_PROVIDER_SITE_OTHER): Payer: Medicare Other | Admitting: Family Medicine

## 2010-11-22 DIAGNOSIS — E538 Deficiency of other specified B group vitamins: Secondary | ICD-10-CM

## 2010-11-22 MED ORDER — CYANOCOBALAMIN 1000 MCG/ML IJ SOLN
1000.0000 ug | Freq: Once | INTRAMUSCULAR | Status: AC
  Administered 2010-11-22: 1000 ug via INTRAMUSCULAR

## 2011-01-31 ENCOUNTER — Ambulatory Visit (INDEPENDENT_AMBULATORY_CARE_PROVIDER_SITE_OTHER): Payer: Medicare Other | Admitting: Family Medicine

## 2011-01-31 DIAGNOSIS — E538 Deficiency of other specified B group vitamins: Secondary | ICD-10-CM

## 2011-01-31 MED ORDER — CYANOCOBALAMIN 1000 MCG/ML IJ SOLN
1000.0000 ug | Freq: Once | INTRAMUSCULAR | Status: AC
Start: 2011-01-31 — End: 2011-01-31
  Administered 2011-01-31: 1000 ug via INTRAMUSCULAR

## 2011-02-02 ENCOUNTER — Telehealth: Payer: Self-pay | Admitting: Family Medicine

## 2011-02-02 MED ORDER — LEVOTHYROXINE SODIUM 125 MCG PO TABS: 125.0000 ug | ORAL_TABLET | Freq: Every day | ORAL | Status: DC

## 2011-02-02 MED ORDER — ATORVASTATIN CALCIUM 20 MG PO TABS: 20.0000 mg | ORAL_TABLET | Freq: Every day | ORAL | Status: DC

## 2011-02-02 NOTE — Telephone Encounter (Signed)
Script sent e-scribe 

## 2011-02-15 LAB — CBC
HCT: 34.7 — ABNORMAL LOW
MCHC: 34.7
MCV: 85.3
Platelets: 205
WBC: 6.5

## 2011-02-15 LAB — COMPREHENSIVE METABOLIC PANEL
ALT: 24
AST: 25
Albumin: 3.6
Alkaline Phosphatase: 78
BUN: 13
CO2: 26
Calcium: 9.4
Chloride: 105
Creatinine, Ser: 0.87

## 2011-02-15 LAB — DIFFERENTIAL
Basophils Relative: 0
Eosinophils Absolute: 0.1
Eosinophils Relative: 2
Monocytes Absolute: 0.4
Monocytes Relative: 6
Neutrophils Relative %: 74

## 2011-02-15 LAB — PROTIME-INR: INR: 1

## 2011-03-02 ENCOUNTER — Ambulatory Visit (INDEPENDENT_AMBULATORY_CARE_PROVIDER_SITE_OTHER): Payer: Medicare Other | Admitting: Family Medicine

## 2011-03-02 ENCOUNTER — Encounter: Payer: Self-pay | Admitting: Family Medicine

## 2011-03-02 VITALS — BP 128/70 | HR 71 | Temp 97.8°F | Ht 65.0 in | Wt 230.0 lb

## 2011-03-02 DIAGNOSIS — E538 Deficiency of other specified B group vitamins: Secondary | ICD-10-CM

## 2011-03-02 DIAGNOSIS — I509 Heart failure, unspecified: Secondary | ICD-10-CM

## 2011-03-02 DIAGNOSIS — Z23 Encounter for immunization: Secondary | ICD-10-CM

## 2011-03-02 DIAGNOSIS — F329 Major depressive disorder, single episode, unspecified: Secondary | ICD-10-CM

## 2011-03-02 DIAGNOSIS — Z136 Encounter for screening for cardiovascular disorders: Secondary | ICD-10-CM

## 2011-03-02 DIAGNOSIS — G7000 Myasthenia gravis without (acute) exacerbation: Secondary | ICD-10-CM

## 2011-03-02 LAB — HEPATIC FUNCTION PANEL
ALT: 24 U/L (ref 0–35)
Total Protein: 7.2 g/dL (ref 6.0–8.3)

## 2011-03-02 LAB — BASIC METABOLIC PANEL
BUN: 19 mg/dL (ref 6–23)
CO2: 26 mEq/L (ref 19–32)
Chloride: 107 mEq/L (ref 96–112)
Creatinine, Ser: 1.1 mg/dL (ref 0.4–1.2)

## 2011-03-02 LAB — CBC WITH DIFFERENTIAL/PLATELET
Basophils Absolute: 0 10*3/uL (ref 0.0–0.1)
HCT: 33.6 % — ABNORMAL LOW (ref 36.0–46.0)
Lymphocytes Relative: 20.2 % (ref 12.0–46.0)
Lymphs Abs: 1.2 10*3/uL (ref 0.7–4.0)
Monocytes Relative: 9.2 % (ref 3.0–12.0)
Platelets: 206 10*3/uL (ref 150.0–400.0)
RDW: 14.9 % — ABNORMAL HIGH (ref 11.5–14.6)

## 2011-03-02 LAB — POCT URINALYSIS DIPSTICK
Protein, UA: NEGATIVE
Urobilinogen, UA: 0.2

## 2011-03-02 LAB — LIPID PANEL
HDL: 42.5 mg/dL (ref >39.00)
Total CHOL/HDL Ratio: 4
VLDL: 67.4 mg/dL — ABNORMAL HIGH (ref 0.0–40.0)

## 2011-03-02 LAB — TSH: TSH: 3.08 u[IU]/mL (ref 0.35–5.50)

## 2011-03-02 LAB — VITAMIN B12

## 2011-03-02 MED ORDER — IRBESARTAN-HYDROCHLOROTHIAZIDE 300-12.5 MG PO TABS: 1.0000 | ORAL_TABLET | Freq: Every day | ORAL | Status: DC

## 2011-03-02 MED ORDER — BUPROPION HCL ER (XL) 150 MG PO TB24: 150.0000 mg | ORAL_TABLET | ORAL | Status: DC

## 2011-03-02 MED ORDER — LEVOTHYROXINE SODIUM 125 MCG PO TABS: 125.0000 ug | ORAL_TABLET | Freq: Every day | ORAL | Status: DC

## 2011-03-02 MED ORDER — ALPRAZOLAM 1 MG PO TABS: 1.0000 mg | ORAL_TABLET | Freq: Three times a day (TID) | ORAL | Status: DC | PRN

## 2011-03-02 MED ORDER — ATORVASTATIN CALCIUM 20 MG PO TABS: 20.0000 mg | ORAL_TABLET | Freq: Every day | ORAL | Status: DC

## 2011-03-02 MED ORDER — CYANOCOBALAMIN 1000 MCG/ML IJ SOLN
1000.0000 ug | Freq: Once | INTRAMUSCULAR | Status: AC
  Administered 2011-03-02: 1000 ug via INTRAMUSCULAR

## 2011-03-02 NOTE — Progress Notes (Signed)
  Subjective:    Patient ID: [name], female    DOB: [birth year], 77 y.o.   MRN: [id number]  HPI 77 yr old female for a cpx. She feels well physically, but she asks for help with her depression. She has had this to a mild degree for years, but she has never wanted to treat it until now. She uses Xanax for anxiety, and this helps. However her feelings of sadness are worse, she gets tearful, and she worries about her family. She tells me about her son who lives in [place]. He has been looking for work for 2 years, and now his wife has breast cancer. This weighs heavily on her mind. Her BP is stable. Her myasthenia is stable. She will be seeing Dr. Terrace Arabia since Dr. Thad Ranger moved away.    Review of Systems  Constitutional: Negative.   HENT: Negative.   Eyes: Negative.   Respiratory: Negative.   Cardiovascular: Negative.   Gastrointestinal: Negative.   Genitourinary: Negative for dysuria, urgency, frequency, hematuria, flank pain, decreased urine volume, enuresis, difficulty urinating, pelvic pain and dyspareunia.  Musculoskeletal: Negative.   Skin: Negative.   Neurological: Negative.   Hematological: Negative.   Psychiatric/Behavioral: Negative.        Objective:   Physical Exam  Constitutional: She is oriented to person, place, and time. She appears well-developed and well-nourished. No distress.  HENT:  Head: Normocephalic and atraumatic.  Right Ear: External ear normal.  Left Ear: External ear normal.  Nose: Nose normal.  Mouth/Throat: Oropharynx is clear and moist. No oropharyngeal exudate.  Eyes: Conjunctivae and EOM are normal. Pupils are equal, round, and reactive to light. No scleral icterus.  Neck: Normal range of motion. Neck supple. No JVD present. No thyromegaly present.  Cardiovascular: Normal rate, regular rhythm, normal heart sounds and intact distal pulses.  Exam reveals no gallop and no friction rub.   No murmur heard.      EKG unremarkable   Pulmonary/Chest:  Effort normal and breath sounds normal. No respiratory distress. She has no wheezes. She has no rales. She exhibits no tenderness.  Abdominal: Soft. Bowel sounds are normal. She exhibits no distension and no mass. There is no tenderness. There is no rebound and no guarding.  Musculoskeletal: Normal range of motion. She exhibits no edema and no tenderness.  Lymphadenopathy:    She has no cervical adenopathy.  Neurological: She is alert and oriented to person, place, and time. She has normal reflexes. No cranial nerve deficit. She exhibits normal muscle tone. Coordination normal.  Skin: Skin is warm and dry. No rash noted. No erythema.  Psychiatric: She has a normal mood and affect. Her behavior is normal. Judgment and thought content normal.          Assessment & Plan:  She seems to be doing well in general, but we will add Wellbutrin for the depression. Recheck in one month.

## 2011-03-04 ENCOUNTER — Telehealth: Payer: Self-pay | Admitting: Family Medicine

## 2011-03-04 NOTE — Telephone Encounter (Signed)
Spoke with pt and gave results. 

## 2011-03-04 NOTE — Telephone Encounter (Signed)
Message copied by Baldemar Friday on Fri Mar 04, 2011  5:04 PM ------      Message from: Gershon Crane A      Created: Fri Mar 04, 2011  9:23 AM       Normal except high TG. Watch the diet

## 2011-03-10 ENCOUNTER — Telehealth: Payer: Self-pay | Admitting: Family Medicine

## 2011-03-10 NOTE — Telephone Encounter (Signed)
Refill request for Avalide 300-12.5 mg take 1 po qd. Pt stated that she lost the RX.

## 2011-03-11 MED ORDER — IRBESARTAN-HYDROCHLOROTHIAZIDE 300-12.5 MG PO TABS: 1.0000 | ORAL_TABLET | Freq: Every day | ORAL | Status: DC

## 2011-03-11 NOTE — Telephone Encounter (Signed)
Script sent e-scribe 

## 2011-03-11 NOTE — Telephone Encounter (Signed)
Call in one year worth

## 2011-05-06 ENCOUNTER — Ambulatory Visit (INDEPENDENT_AMBULATORY_CARE_PROVIDER_SITE_OTHER): Payer: Medicare Other | Admitting: Family Medicine

## 2011-05-06 DIAGNOSIS — E539 Vitamin B deficiency, unspecified: Secondary | ICD-10-CM

## 2011-05-06 MED ORDER — CYANOCOBALAMIN 1000 MCG/ML IJ SOLN
1000.0000 ug | Freq: Once | INTRAMUSCULAR | Status: AC
  Administered 2011-05-06: 1000 ug via INTRAMUSCULAR

## 2011-05-31 ENCOUNTER — Other Ambulatory Visit: Payer: Self-pay | Admitting: Family Medicine

## 2011-08-01 ENCOUNTER — Ambulatory Visit (INDEPENDENT_AMBULATORY_CARE_PROVIDER_SITE_OTHER): Payer: Medicare Other | Admitting: Family Medicine

## 2011-08-01 DIAGNOSIS — D649 Anemia, unspecified: Secondary | ICD-10-CM

## 2011-08-01 DIAGNOSIS — D518 Other vitamin B12 deficiency anemias: Secondary | ICD-10-CM

## 2011-08-19 ENCOUNTER — Other Ambulatory Visit: Payer: Self-pay | Admitting: Family Medicine

## 2011-08-19 DIAGNOSIS — Z1231 Encounter for screening mammogram for malignant neoplasm of breast: Secondary | ICD-10-CM

## 2011-08-23 ENCOUNTER — Other Ambulatory Visit: Payer: Self-pay | Admitting: Family Medicine

## 2011-08-29 ENCOUNTER — Ambulatory Visit
Admission: RE | Admit: 2011-08-29 | Discharge: 2011-08-29 | Disposition: A | Discharge status: Home / Self Care | Payer: Medicare Other | Source: Ambulatory Visit | Attending: Family Medicine | Admitting: Family Medicine

## 2011-08-29 DIAGNOSIS — Z1231 Encounter for screening mammogram for malignant neoplasm of breast: Secondary | ICD-10-CM

## 2011-09-27 ENCOUNTER — Telehealth: Payer: Self-pay | Admitting: Family Medicine

## 2011-09-27 NOTE — Telephone Encounter (Signed)
Call in #[phone] with 5 rf 

## 2011-09-27 NOTE — Telephone Encounter (Signed)
Refill request for Alprazolam 1 mg take 1 po tid and pt last here on 03/02/11.

## 2011-09-28 MED ORDER — ALPRAZOLAM 1 MG PO TABS: 1.0000 mg | ORAL_TABLET | Freq: Three times a day (TID) | ORAL | Status: DC | PRN

## 2011-09-28 NOTE — Telephone Encounter (Signed)
Script called in

## 2011-11-22 ENCOUNTER — Other Ambulatory Visit: Payer: Self-pay | Admitting: Family Medicine

## 2011-11-25 ENCOUNTER — Telehealth: Payer: Self-pay | Admitting: Family Medicine

## 2011-11-25 MED ORDER — IRBESARTAN-HYDROCHLOROTHIAZIDE 300-12.5 MG PO TABS: 1.0000 | ORAL_TABLET | Freq: Every day | ORAL | Status: DC

## 2011-11-25 NOTE — Telephone Encounter (Signed)
Patient's daughter called stating that she need a refill of her avalide and states upon calling the pharmacy she was told she need an appt. Patient is scheduled for her annual in October. Please advise. Karin Golden in [place]. Please inform patient's daughter of advise.

## 2011-11-25 NOTE — Telephone Encounter (Signed)
Rx sent 

## 2011-11-29 ENCOUNTER — Ambulatory Visit (INDEPENDENT_AMBULATORY_CARE_PROVIDER_SITE_OTHER): Payer: Medicare Other | Admitting: Family Medicine

## 2011-11-29 DIAGNOSIS — E539 Vitamin B deficiency, unspecified: Secondary | ICD-10-CM

## 2011-11-29 MED ORDER — CYANOCOBALAMIN 1000 MCG/ML IJ SOLN
1000.0000 ug | Freq: Once | INTRAMUSCULAR | Status: AC
  Administered 2011-11-29: 1000 ug via INTRAMUSCULAR

## 2012-01-24 ENCOUNTER — Other Ambulatory Visit: Payer: Self-pay | Admitting: Family Medicine

## 2012-02-21 ENCOUNTER — Other Ambulatory Visit: Payer: Self-pay | Admitting: Family Medicine

## 2012-03-07 ENCOUNTER — Ambulatory Visit (INDEPENDENT_AMBULATORY_CARE_PROVIDER_SITE_OTHER): Payer: Medicare Other | Admitting: Family Medicine

## 2012-03-07 ENCOUNTER — Encounter: Payer: Self-pay | Admitting: Family Medicine

## 2012-03-07 VITALS — BP 140/80 | HR 69 | Temp 98.3°F | Ht 65.75 in | Wt 218.0 lb

## 2012-03-07 DIAGNOSIS — I10 Essential (primary) hypertension: Secondary | ICD-10-CM

## 2012-03-07 DIAGNOSIS — E785 Hyperlipidemia, unspecified: Secondary | ICD-10-CM

## 2012-03-07 DIAGNOSIS — G7000 Myasthenia gravis without (acute) exacerbation: Secondary | ICD-10-CM

## 2012-03-07 DIAGNOSIS — I4891 Unspecified atrial fibrillation: Secondary | ICD-10-CM

## 2012-03-07 DIAGNOSIS — Z23 Encounter for immunization: Secondary | ICD-10-CM

## 2012-03-07 DIAGNOSIS — E538 Deficiency of other specified B group vitamins: Secondary | ICD-10-CM

## 2012-03-07 DIAGNOSIS — N289 Disorder of kidney and ureter, unspecified: Secondary | ICD-10-CM

## 2012-03-07 LAB — CBC WITH DIFFERENTIAL/PLATELET
Basophils Absolute: 0 10*3/uL (ref 0.0–0.1)
Eosinophils Absolute: 0.1 10*3/uL (ref 0.0–0.7)
Hemoglobin: 10.1 g/dL — ABNORMAL LOW (ref 12.0–15.0)
Lymphocytes Relative: 19.5 % (ref 12.0–46.0)
MCHC: 32.8 g/dL (ref 30.0–36.0)
Monocytes Relative: 8.6 % (ref 3.0–12.0)
Neutro Abs: 4.2 10*3/uL (ref 1.4–7.7)
Neutrophils Relative %: 69.9 % (ref 43.0–77.0)
Platelets: 204 10*3/uL (ref 150.0–400.0)
RDW: 14.7 % — ABNORMAL HIGH (ref 11.5–14.6)

## 2012-03-07 LAB — LDL CHOLESTEROL, DIRECT: Direct LDL: 59 mg/dL

## 2012-03-07 LAB — POCT URINALYSIS DIPSTICK
Ketones, UA: NEGATIVE
Nitrite, UA: NEGATIVE
Protein, UA: NEGATIVE

## 2012-03-07 LAB — BASIC METABOLIC PANEL
BUN: 22 mg/dL (ref 6–23)
Chloride: 107 mEq/L (ref 96–112)
Creatinine, Ser: 1.2 mg/dL (ref 0.4–1.2)
GFR: 44.44 mL/min — ABNORMAL LOW (ref >60.00)
Glucose, Bld: 99 mg/dL (ref 70–99)

## 2012-03-07 LAB — HEPATIC FUNCTION PANEL
ALT: 18 U/L (ref 0–35)
Albumin: 3.7 g/dL (ref 3.5–5.2)
Total Bilirubin: 0.6 mg/dL (ref 0.3–1.2)

## 2012-03-07 LAB — LIPID PANEL: Triglycerides: 275 mg/dL — ABNORMAL HIGH (ref 0.0–149.0)

## 2012-03-07 LAB — TSH: TSH: 3.24 u[IU]/mL (ref 0.35–5.50)

## 2012-03-07 LAB — VITAMIN B12: Vitamin B-12: 282 pg/mL (ref 211–911)

## 2012-03-07 MED ORDER — LEVOTHYROXINE SODIUM 125 MCG PO TABS: 125.0000 ug | ORAL_TABLET | Freq: Every day | ORAL | Status: DC

## 2012-03-07 MED ORDER — ATORVASTATIN CALCIUM 20 MG PO TABS: 20.0000 mg | ORAL_TABLET | Freq: Every day | ORAL | Status: DC

## 2012-03-07 MED ORDER — ALPRAZOLAM 1 MG PO TABS: 1.0000 mg | ORAL_TABLET | Freq: Three times a day (TID) | ORAL | Status: DC | PRN

## 2012-03-07 MED ORDER — IRBESARTAN-HYDROCHLOROTHIAZIDE 300-12.5 MG PO TABS: 1.0000 | ORAL_TABLET | Freq: Every day | ORAL | Status: DC

## 2012-03-07 MED ORDER — BUPROPION HCL ER (XL) 150 MG PO TB24: 150.0000 mg | ORAL_TABLET | ORAL | Status: DC

## 2012-03-07 NOTE — Progress Notes (Signed)
  Subjective:    Patient ID: [name], female    DOB: [birth year], 78 y.o.   MRN: [id number]  HPI 78 yr old female for a cpx. She feels well in general except for chronic fatigue.    Review of Systems  Constitutional: Negative.   HENT: Negative.   Eyes: Negative.   Respiratory: Negative.   Cardiovascular: Negative.   Gastrointestinal: Negative.   Genitourinary: Negative for dysuria, urgency, frequency, hematuria, flank pain, decreased urine volume, enuresis, difficulty urinating, pelvic pain and dyspareunia.  Musculoskeletal: Negative.   Skin: Negative.   Neurological: Negative.   Hematological: Negative.   Psychiatric/Behavioral: Negative.        Objective:   Physical Exam  Constitutional: She is oriented to person, place, and time. She appears well-developed and well-nourished. No distress.  HENT:  Head: Normocephalic and atraumatic.  Right Ear: External ear normal.  Left Ear: External ear normal.  Nose: Nose normal.  Mouth/Throat: Oropharynx is clear and moist. No oropharyngeal exudate.  Eyes: Conjunctivae normal and EOM are normal. Pupils are equal, round, and reactive to light. No scleral icterus.  Neck: Normal range of motion. Neck supple. No JVD present. No thyromegaly present.  Cardiovascular: Normal rate, regular rhythm, normal heart sounds and intact distal pulses.  Exam reveals no gallop and no friction rub.   No murmur heard.      EKG normal with first degree AVB  Pulmonary/Chest: Effort normal and breath sounds normal. No respiratory distress. She has no wheezes. She has no rales. She exhibits no tenderness.  Abdominal: Soft. Bowel sounds are normal. She exhibits no distension and no mass. There is no tenderness. There is no rebound and no guarding.  Musculoskeletal: Normal range of motion. She exhibits no edema and no tenderness.  Lymphadenopathy:    She has no cervical adenopathy.  Neurological: She is alert and oriented to person, place, and time. She  has normal reflexes. No cranial nerve deficit. She exhibits normal muscle tone. Coordination normal.  Skin: Skin is warm and dry. No rash noted. No erythema.  Psychiatric: She has a normal mood and affect. Her behavior is normal. Judgment and thought content normal.          Assessment & Plan:  Well exam. Get labs today

## 2012-03-08 NOTE — Addendum Note (Signed)
Addended by: Gershon Crane A on: 03/08/2012 09:38 AM   Modules accepted: Orders

## 2012-03-12 NOTE — Progress Notes (Signed)
Quick Note:  I spoke with pt. Per Dr. Clent Ridges, pt can stop the B12 injections for now and then recheck this level in about 6 months. ______

## 2012-03-25 ENCOUNTER — Other Ambulatory Visit: Payer: Self-pay | Admitting: Family Medicine

## 2012-06-05 ENCOUNTER — Encounter: Payer: Self-pay | Admitting: Family Medicine

## 2012-06-11 ENCOUNTER — Telehealth: Payer: Self-pay | Admitting: Family Medicine

## 2012-06-11 NOTE — Telephone Encounter (Signed)
Pt's daughter called about ultra sound of kidney and if mom really needs this done. Per Dr. Hyman Hopes, she does not need test and Dr. Clent Ridges did agree. I did give this message to daughter.

## 2012-09-19 ENCOUNTER — Telehealth: Payer: Self-pay

## 2012-09-19 NOTE — Telephone Encounter (Signed)
Daughter called wanting to let us know [name] would be faxing [place] the Cellcept PAP form to complete and return.  As well wanted to be sure we knew mom has an upcoming appt scheduled.  I have received form, completed it and faxed it back.

## 2012-09-23 ENCOUNTER — Other Ambulatory Visit: Payer: Self-pay | Admitting: Neurology

## 2012-09-23 ENCOUNTER — Other Ambulatory Visit: Payer: Self-pay | Admitting: Family Medicine

## 2012-09-25 ENCOUNTER — Telehealth: Payer: Self-pay | Admitting: Family Medicine

## 2012-09-25 NOTE — Telephone Encounter (Signed)
Refill request for Alprazolam 1 mg take 1 po tid prn

## 2012-09-25 NOTE — Telephone Encounter (Signed)
Call in #[phone] with 5 rf 

## 2012-09-26 MED ORDER — ALPRAZOLAM 1 MG PO TABS: 1.0000 mg | ORAL_TABLET | Freq: Three times a day (TID) | ORAL | Status: DC | PRN

## 2012-09-26 NOTE — Telephone Encounter (Signed)
I called in script 

## 2012-10-10 ENCOUNTER — Ambulatory Visit (INDEPENDENT_AMBULATORY_CARE_PROVIDER_SITE_OTHER): Payer: Medicare Other | Admitting: Nurse Practitioner

## 2012-10-10 ENCOUNTER — Encounter: Payer: Self-pay | Admitting: Nurse Practitioner

## 2012-10-10 VITALS — BP 134/62 | HR 74 | Ht 65.5 in | Wt 204.0 lb

## 2012-10-10 DIAGNOSIS — G459 Transient cerebral ischemic attack, unspecified: Secondary | ICD-10-CM

## 2012-10-10 DIAGNOSIS — G7000 Myasthenia gravis without (acute) exacerbation: Secondary | ICD-10-CM

## 2012-10-10 DIAGNOSIS — R269 Unspecified abnormalities of gait and mobility: Secondary | ICD-10-CM

## 2012-10-10 MED ORDER — CLOPIDOGREL BISULFATE 75 MG PO TABS: 75.0000 mg | ORAL_TABLET | Freq: Every day | ORAL | Status: DC

## 2012-10-10 NOTE — Patient Instructions (Addendum)
Continue Cellcept. Letter of medical necessity stance April 30 Patient gets medication  assistance Continue Plavix for secondary stroke prevention will reorder medication Followup yearly and when necessary

## 2012-10-10 NOTE — Progress Notes (Signed)
HPI: Patient returns for followup after her last visit 06/22/2011. She has a history of myasthenia gravis since 2000 and has been in pharmacologic remission for the past few years on CellCept. She has stable diplopia and wears prism glasses. In April 2009 she developed acute weakness and sensory changes in her left leg with difficulty walking. MRI done a couple of weeks later was negative but her gait disturbance has persistent and it is felt that she had an acute stroke at that time. She was switched from aspirin to Plavix and had stroke workup which was otherwise unremarkable. She denies interval problems with her myasthenia symptoms or other focal neurologic symptoms she is using a rolling walker she has not fallen. She currently lives with her daughter  ROS:   dizziness, depression, anxiety, decreased energy, change in appetite  Physical Exam General: well developed, well nourished, seated, in no evident distress Head: head normocephalic and atraumatic. Oropharynx benign Neck: supple with no carotid  bruits Cardiovascular: regular rate and rhythm, no murmurs  Neurologic Exam Mental Status: Awake and fully alert. Oriented to place and time. Follows all commands. Speech and language are normal  Cranial Nerves: Pupils equal, briskly reactive to light. Extraocular movements full without nystagmus. Visual fields full to confrontation. Mild weakness of eye closure Hearing intact and symmetric to finger snap. Facial sensation intact. Face, tongue, palate move normally and symmetrically. Neck flexion and extension normal.  Motor: Normal bulk and tone. Normal strength in all tested extremity muscles. No focal weakness Sensory.: intact to touch and pinprick and vibratory.  Coordination: Rapid alternating movements normal in all extremities. Finger-to-nose performed accurately bilaterally. Gait and Station: Gait is stable with rolling walker. No difficulty with turns      ASSESSMENT: Myasthenia gravis  21 years ago in pharmacologic remission with CellCept. She is stable from a neurologic standpoint Stroke event in 2009, currently on Plavix, or refill Depression and anxiety currently on Wellbutrin and Xanax     PLAN: Continue Cellcept. Letter of medical necessity sent September 19, 2012 Patient gets medication  Assistance for this Continue Plavix for secondary stroke prevention will reorder medication Bereavement  Counseling would be recommended along with medications for depression and anxiety Followup yearly and when necessary   Nilda Riggs, GNP-BC APRN

## 2012-12-03 ENCOUNTER — Telehealth: Payer: Self-pay | Admitting: Family Medicine

## 2012-12-03 NOTE — Telephone Encounter (Signed)
Yes, okay to schedule.

## 2012-12-03 NOTE — Telephone Encounter (Signed)
appt made/kh 

## 2012-12-03 NOTE — Telephone Encounter (Signed)
Pt has been having intermittent diarrhea and has lost several pounds. Daughter is concerned  And would like to see Dr Clent Ridges as soon as possible. Refused another provider. Is it ok to schedule Friday appt.?

## 2012-12-07 ENCOUNTER — Encounter: Payer: Self-pay | Admitting: Family Medicine

## 2012-12-07 ENCOUNTER — Inpatient Hospital Stay (HOSPITAL_COMMUNITY)
Admission: EM | Admit: 2012-12-07 | Discharge: 2012-12-12 | DRG: 641 | Disposition: A | Discharge status: Home / Self Care | Payer: Medicare Other | Attending: Internal Medicine | Admitting: Internal Medicine

## 2012-12-07 ENCOUNTER — Telehealth: Payer: Self-pay | Admitting: Family Medicine

## 2012-12-07 ENCOUNTER — Ambulatory Visit (INDEPENDENT_AMBULATORY_CARE_PROVIDER_SITE_OTHER): Payer: Medicare Other | Admitting: Family Medicine

## 2012-12-07 ENCOUNTER — Encounter (HOSPITAL_COMMUNITY): Payer: Self-pay | Admitting: *Deleted

## 2012-12-07 VITALS — BP 120/60 | HR 85 | Temp 98.0°F | Wt 195.0 lb

## 2012-12-07 DIAGNOSIS — I4891 Unspecified atrial fibrillation: Secondary | ICD-10-CM | POA: Diagnosis present

## 2012-12-07 DIAGNOSIS — K5289 Other specified noninfective gastroenteritis and colitis: Secondary | ICD-10-CM | POA: Diagnosis present

## 2012-12-07 DIAGNOSIS — E876 Hypokalemia: Principal | ICD-10-CM

## 2012-12-07 DIAGNOSIS — D518 Other vitamin B12 deficiency anemias: Secondary | ICD-10-CM

## 2012-12-07 DIAGNOSIS — Z8673 Personal history of transient ischemic attack (TIA), and cerebral infarction without residual deficits: Secondary | ICD-10-CM

## 2012-12-07 DIAGNOSIS — I10 Essential (primary) hypertension: Secondary | ICD-10-CM

## 2012-12-07 DIAGNOSIS — G7000 Myasthenia gravis without (acute) exacerbation: Secondary | ICD-10-CM

## 2012-12-07 DIAGNOSIS — R197 Diarrhea, unspecified: Secondary | ICD-10-CM

## 2012-12-07 DIAGNOSIS — F411 Generalized anxiety disorder: Secondary | ICD-10-CM

## 2012-12-07 DIAGNOSIS — A0839 Other viral enteritis: Secondary | ICD-10-CM

## 2012-12-07 DIAGNOSIS — R5383 Other fatigue: Secondary | ICD-10-CM | POA: Diagnosis present

## 2012-12-07 DIAGNOSIS — M810 Age-related osteoporosis without current pathological fracture: Secondary | ICD-10-CM | POA: Diagnosis present

## 2012-12-07 DIAGNOSIS — E039 Hypothyroidism, unspecified: Secondary | ICD-10-CM

## 2012-12-07 DIAGNOSIS — R634 Abnormal weight loss: Secondary | ICD-10-CM

## 2012-12-07 DIAGNOSIS — B259 Cytomegaloviral disease, unspecified: Secondary | ICD-10-CM

## 2012-12-07 DIAGNOSIS — G459 Transient cerebral ischemic attack, unspecified: Secondary | ICD-10-CM

## 2012-12-07 DIAGNOSIS — D519 Vitamin B12 deficiency anemia, unspecified: Secondary | ICD-10-CM

## 2012-12-07 DIAGNOSIS — R5381 Other malaise: Secondary | ICD-10-CM | POA: Diagnosis present

## 2012-12-07 DIAGNOSIS — D649 Anemia, unspecified: Secondary | ICD-10-CM

## 2012-12-07 DIAGNOSIS — K633 Ulcer of intestine: Secondary | ICD-10-CM

## 2012-12-07 DIAGNOSIS — E440 Moderate protein-calorie malnutrition: Secondary | ICD-10-CM

## 2012-12-07 DIAGNOSIS — E785 Hyperlipidemia, unspecified: Secondary | ICD-10-CM

## 2012-12-07 DIAGNOSIS — E538 Deficiency of other specified B group vitamins: Secondary | ICD-10-CM

## 2012-12-07 DIAGNOSIS — N179 Acute kidney failure, unspecified: Secondary | ICD-10-CM | POA: Diagnosis present

## 2012-12-07 DIAGNOSIS — Z800 Family history of malignant neoplasm of digestive organs: Secondary | ICD-10-CM

## 2012-12-07 LAB — CBC WITH DIFFERENTIAL/PLATELET
Basophils Absolute: 0 10*3/uL (ref 0.0–0.1)
Eosinophils Absolute: 0 10*3/uL (ref 0.0–0.7)
Hemoglobin: 9.2 g/dL — ABNORMAL LOW (ref 12.0–15.0)
Lymphocytes Relative: 8.9 % — ABNORMAL LOW (ref 12.0–46.0)
MCHC: 32.9 g/dL (ref 30.0–36.0)
Monocytes Relative: 6.9 % (ref 3.0–12.0)
Neutro Abs: 7.7 10*3/uL (ref 1.4–7.7)
Platelets: 292 10*3/uL (ref 150.0–400.0)
RDW: 16.4 % — ABNORMAL HIGH (ref 11.5–14.6)

## 2012-12-07 LAB — CBC
Hemoglobin: 8.3 g/dL — ABNORMAL LOW (ref 12.0–15.0)
Platelets: 269 10*3/uL (ref 150–400)
RBC: 3.11 MIL/uL — ABNORMAL LOW (ref 3.87–5.11)
WBC: 7.2 10*3/uL (ref 4.0–10.5)

## 2012-12-07 LAB — LIPASE, BLOOD: Lipase: 9 U/L — ABNORMAL LOW (ref 11–59)

## 2012-12-07 LAB — GLUCOSE, CAPILLARY: Glucose-Capillary: 131 mg/dL — ABNORMAL HIGH (ref 70–99)

## 2012-12-07 LAB — HEPATIC FUNCTION PANEL
ALT: 16 U/L (ref 0–35)
AST: 17 U/L (ref 0–37)
Alkaline Phosphatase: 78 U/L (ref 39–117)
Bilirubin, Direct: 0.2 mg/dL (ref 0.0–0.3)
Total Bilirubin: 0.9 mg/dL (ref 0.3–1.2)

## 2012-12-07 LAB — BASIC METABOLIC PANEL
Chloride: 98 mEq/L (ref 96–112)
Potassium: 2.1 mEq/L — CL (ref 3.5–5.1)

## 2012-12-07 MED ORDER — SODIUM CHLORIDE 0.9 % IJ SOLN
3.0000 mL | Freq: Two times a day (BID) | INTRAMUSCULAR | Status: DC
  Administered 2012-12-08 – 2012-12-12 (×8): 3 mL via INTRAVENOUS

## 2012-12-07 MED ORDER — ALPRAZOLAM 1 MG PO TABS
1.0000 mg | ORAL_TABLET | Freq: Three times a day (TID) | ORAL | Status: DC | PRN
  Administered 2012-12-08 – 2012-12-11 (×4): 1 mg via ORAL
  Filled 2012-12-07 (×4): qty 1

## 2012-12-07 MED ORDER — BUPROPION HCL ER (XL) 150 MG PO TB24
150.0000 mg | ORAL_TABLET | Freq: Every day | ORAL | Status: DC
  Administered 2012-12-08 – 2012-12-12 (×5): 150 mg via ORAL
  Filled 2012-12-07 (×5): qty 1

## 2012-12-07 MED ORDER — MORPHINE SULFATE 2 MG/ML IJ SOLN: 1.0000 mg | INTRAMUSCULAR | Status: DC | PRN

## 2012-12-07 MED ORDER — HYDROCODONE-ACETAMINOPHEN 5-325 MG PO TABS
1.0000 | ORAL_TABLET | ORAL | Status: DC | PRN
  Administered 2012-12-09 (×2): 2 via ORAL
  Administered 2012-12-11 – 2012-12-12 (×2): 1 via ORAL
  Filled 2012-12-07: qty 2
  Filled 2012-12-07: qty 1
  Filled 2012-12-07: qty 2
  Filled 2012-12-07: qty 1

## 2012-12-07 MED ORDER — MYCOPHENOLATE MOFETIL 250 MG PO CAPS
1000.0000 mg | ORAL_CAPSULE | Freq: Two times a day (BID) | ORAL | Status: DC
  Administered 2012-12-07 – 2012-12-12 (×10): 1000 mg via ORAL
  Filled 2012-12-07 (×11): qty 4

## 2012-12-07 MED ORDER — LEVOTHYROXINE SODIUM 125 MCG PO TABS
125.0000 ug | ORAL_TABLET | Freq: Every day | ORAL | Status: DC
  Administered 2012-12-08 – 2012-12-12 (×5): 125 ug via ORAL
  Filled 2012-12-07 (×7): qty 1

## 2012-12-07 MED ORDER — ACETAMINOPHEN 325 MG PO TABS: 650.0000 mg | ORAL_TABLET | Freq: Four times a day (QID) | ORAL | Status: DC | PRN

## 2012-12-07 MED ORDER — ONDANSETRON HCL 4 MG PO TABS: 4.0000 mg | ORAL_TABLET | Freq: Four times a day (QID) | ORAL | Status: DC | PRN

## 2012-12-07 MED ORDER — ATORVASTATIN CALCIUM 20 MG PO TABS
20.0000 mg | ORAL_TABLET | Freq: Every day | ORAL | Status: DC
  Administered 2012-12-08 – 2012-12-11 (×3): 20 mg via ORAL
  Filled 2012-12-07 (×5): qty 1

## 2012-12-07 MED ORDER — VITAMIN B-12 1000 MCG PO TABS
1000.0000 ug | ORAL_TABLET | Freq: Every day | ORAL | Status: DC
  Administered 2012-12-08 – 2012-12-12 (×4): 1000 ug via ORAL
  Filled 2012-12-07 (×6): qty 1

## 2012-12-07 MED ORDER — OMEGA-3 FATTY ACIDS 1000 MG PO CAPS: 2.0000 g | ORAL_CAPSULE | Freq: Every day | ORAL | Status: DC

## 2012-12-07 MED ORDER — ALPRAZOLAM 0.25 MG PO TABS: 0.2500 mg | ORAL_TABLET | Freq: Two times a day (BID) | ORAL | Status: DC | PRN

## 2012-12-07 MED ORDER — CLOPIDOGREL BISULFATE 75 MG PO TABS
75.0000 mg | ORAL_TABLET | Freq: Every day | ORAL | Status: DC
  Administered 2012-12-08 – 2012-12-12 (×5): 75 mg via ORAL
  Filled 2012-12-07 (×7): qty 1

## 2012-12-07 MED ORDER — ACETAMINOPHEN 650 MG RE SUPP: 650.0000 mg | Freq: Four times a day (QID) | RECTAL | Status: DC | PRN

## 2012-12-07 MED ORDER — CO-ENZYME Q-10 50 MG PO CAPS: 50.0000 mg | ORAL_CAPSULE | Freq: Every day | ORAL | Status: DC

## 2012-12-07 MED ORDER — SODIUM CHLORIDE 0.9 % IV SOLN
INTRAVENOUS | Status: AC
  Administered 2012-12-07: 75 mL/h via INTRAVENOUS

## 2012-12-07 MED ORDER — OMEGA-3-ACID ETHYL ESTERS 1 G PO CAPS
1.0000 g | ORAL_CAPSULE | Freq: Every day | ORAL | Status: DC
  Administered 2012-12-08 – 2012-12-12 (×4): 1 g via ORAL
  Filled 2012-12-07 (×5): qty 1

## 2012-12-07 MED ORDER — GLUCOSAMINE-CHONDROITIN 500-400 MG PO TABS: 1.0000 | ORAL_TABLET | Freq: Three times a day (TID) | ORAL | Status: DC

## 2012-12-07 MED ORDER — POTASSIUM CHLORIDE CRYS ER 20 MEQ PO TBCR
60.0000 meq | EXTENDED_RELEASE_TABLET | Freq: Once | ORAL | Status: AC
  Administered 2012-12-07: 60 meq via ORAL
  Filled 2012-12-07: qty 3

## 2012-12-07 MED ORDER — ONDANSETRON HCL 4 MG/2ML IJ SOLN: 4.0000 mg | Freq: Four times a day (QID) | INTRAMUSCULAR | Status: DC | PRN

## 2012-12-07 MED ORDER — FAMOTIDINE 20 MG PO TABS
20.0000 mg | ORAL_TABLET | Freq: Every day | ORAL | Status: DC
  Administered 2012-12-08 – 2012-12-12 (×5): 20 mg via ORAL
  Filled 2012-12-07 (×5): qty 1

## 2012-12-07 MED ORDER — POTASSIUM CHLORIDE 10 MEQ/100ML IV SOLN
10.0000 meq | INTRAVENOUS | Status: AC
  Administered 2012-12-07 – 2012-12-08 (×4): 10 meq via INTRAVENOUS
  Filled 2012-12-07 (×3): qty 100

## 2012-12-07 MED ORDER — MYCOPHENOLATE MOFETIL 500 MG PO TABS: 1000.0000 mg | ORAL_TABLET | Freq: Two times a day (BID) | ORAL | Status: DC

## 2012-12-07 MED ORDER — VITAMIN C 250 MG PO TABS
250.0000 mg | ORAL_TABLET | Freq: Every day | ORAL | Status: DC
  Administered 2012-12-08 – 2012-12-12 (×4): 250 mg via ORAL
  Filled 2012-12-07 (×5): qty 1

## 2012-12-07 NOTE — Telephone Encounter (Signed)
[facility] called and potassium is 2.1

## 2012-12-07 NOTE — Progress Notes (Addendum)
PHARMACIST - PHYSICIAN ORDER COMMUNICATION  TOPIC: P&T Medication Policy on Herbal Medications  This patient's orders for Co-enzyme Q-10 and Glucosamine-Chondroitin have been noted.  These products are classified as "herbal" or natural products.  The Pharmacy and Therapeutics Committee does not permit the use of "herbal" or natural products of this type within [facility]. . This policy was adopted due to a lack of definitive safety studies or FDA approval, nonstandard manufacturing practices, and the potential risk of unknown drug-drug interactions with inpatient medications.     ACTION TAKEN: The Pharmacy Department is unable to verify these orders at this time.  Please re-evaluate the need to continue these medications at the time of discharge.  Polo Riley R.Ph. 12/07/2012 11:04 PM

## 2012-12-07 NOTE — ED Provider Notes (Signed)
History    CSN: [id number] Arrival date & time 12/07/12  1802  First MD Initiated Contact with Patient 12/07/12 1850     Chief Complaint  Patient presents with  . low potassium 2.1   . Weakness   (Consider location/radiation/quality/duration/timing/severity/associated sxs/prior Treatment) HPI Comments: 79 yo female sent from PCP secondary to hypokalemia (2.1).    Patient is a 79 y.o. female presenting with general illness.  Illness Quality:  Fatigue, malaise Severity:  Severe Onset quality:  Gradual Timing:  Constant Progression:  Worsening Chronicity:  New Context:  Chronic diarrhea Associated symptoms: no abdominal pain, no chest pain, no congestion, no cough, no diarrhea, no fever, no nausea, no shortness of breath and no vomiting    Past Medical History  Diagnosis Date  . Anemia   . Hyperlipemia   . Hypertension   . Thyroid disease     hypothyroidism  . Anxiety   . Paroxysmal atrial fibrillation   . Vitamin B 12 deficiency   . CHF (congestive heart failure)   . TIA (transient ischemic attack)     4-09  . Microscopic hematuria     benign microscopic hematuria, worked up b Dr Alexis Frock  . Gall stone     porcelain gall bladder with single stone  per CT 02-2009 Dr Birdie Sons  . Osteoporosis     DEXA on 04-30-10  . Myasthenia gravis     sees Dr. Terrace Arabia    Past Surgical History  Procedure Laterality Date  . Cardiac catheterization      04/1999  . Abdominal hysterectomy      with oophorectomy  . Colonoscopy  1999    normal    History reviewed. No pertinent family history. History  Substance Use Topics  . Smoking status: Never Smoker   . Smokeless tobacco: Never Used  . Alcohol Use: No   OB History   Grav Para Term Preterm Abortions TAB SAB Ect Mult Living                 Review of Systems  Constitutional: Negative for fever.  HENT: Negative for congestion.   Respiratory: Negative for cough and shortness of breath.   Cardiovascular: Negative  for chest pain.  Gastrointestinal: Negative for nausea, vomiting, abdominal pain and diarrhea.  All other systems reviewed and are negative.    Allergies  Furosemide and Sulfamethoxazole  Home Medications   Current Outpatient Rx  Name  Route  Sig  Dispense  Refill  . ALPRAZolam (XANAX) 0.25 MG tablet   Oral   Take 1 tablet (0.25 mg total) by mouth 2 (two) times daily as needed for anxiety.   60 tablet   5   . ALPRAZolam (XANAX) 1 MG tablet   Oral   Take 1 tablet (1 mg total) by mouth 3 (three) times daily as needed for anxiety.   90 tablet   5   . atorvastatin (LIPITOR) 20 MG tablet   Oral   Take 20 mg by mouth daily.          Marland Kitchen buPROPion (WELLBUTRIN XL) 150 MG 24 hr tablet   Oral   Take 1 tablet (150 mg total) by mouth every morning.   30 tablet   11   . clopidogrel (PLAVIX) 75 MG tablet   Oral   Take 1 tablet (75 mg total) by mouth daily.   90 tablet   3   . co-enzyme Q-10 50 MG capsule   Oral   Take  50 mg by mouth daily.         . fish oil-omega-3 fatty acids 1000 MG capsule   Oral   Take 2 g by mouth daily.         Marland Kitchen glucosamine-chondroitin 500-400 MG tablet   Oral   Take 1 tablet by mouth 3 (three) times daily.         . irbesartan-hydrochlorothiazide (AVALIDE) 300-12.5 MG per tablet   Oral   Take 1 tablet by mouth daily.   30 tablet   11   . levothyroxine (SYNTHROID, LEVOTHROID) 125 MCG tablet   Oral   Take 1 tablet (125 mcg total) by mouth daily.   30 tablet   11   . Misc Natural Products (FLEX-A-MIN JOINT FLEX PO)   Oral   Take by mouth.         . mycophenolate (CELLCEPT) 500 MG tablet   Oral   Take 1,000 mg by mouth 2 (two) times daily.          . Probiotic Product (PROBIOTIC DAILY PO)   Oral   Take by mouth.         . ranitidine (ZANTAC) 150 MG tablet   Oral   Take 150 mg by mouth daily.         . vitamin B-12 (CYANOCOBALAMIN) 1000 MCG tablet   Oral   Take 1,000 mcg by mouth daily.         . vitamin C  (ASCORBIC ACID) 250 MG tablet   Oral   Take 250 mg by mouth daily.          BP 106/50  Pulse 74  Temp(Src) 98.9 F (37.2 C) (Oral)  Resp 16  SpO2 94% Physical Exam  Nursing note and vitals reviewed. Constitutional: She is oriented to person, place, and time. She appears well-developed and well-nourished. No distress.  HENT:  Head: Normocephalic and atraumatic.  Mouth/Throat: Oropharynx is clear and moist.  Eyes: Conjunctivae are normal. Pupils are equal, round, and reactive to light. No scleral icterus.  Neck: Neck supple.  Cardiovascular: Normal rate, regular rhythm, normal heart sounds and intact distal pulses.   No murmur heard. Pulmonary/Chest: Effort normal and breath sounds normal. No stridor. No respiratory distress. She has no rales.  Abdominal: Soft. Bowel sounds are normal. She exhibits no distension. There is no tenderness.  Musculoskeletal: Normal range of motion.  Neurological: She is alert and oriented to person, place, and time.  Skin: Skin is warm and dry. No rash noted.  Psychiatric: She has a normal mood and affect. Her behavior is normal.    ED Course  Procedures (including critical care time) Labs Reviewed  CBC - Abnormal; Notable for the following:    RBC 3.11 (*)    Hemoglobin 8.3 (*)    HCT 25.4 (*)    RDW 15.9 (*)    All other components within normal limits  GLUCOSE, CAPILLARY - Abnormal; Notable for the following:    Glucose-Capillary 131 (*)    All other components within normal limits  BASIC METABOLIC PANEL  LIPASE, BLOOD   No results found.  EKG - indeterminate rhythm, rate 71, left axis, wide QRS (146), prolonged QTc (504), no prominent U waves, compared to prior, QRS and QTc now prolonged.    1. Hypokalemia     MDM  79 yo female with chronic diarrhea and no found to be hypokalemic.  EKG changes present.  Will give oral and IV K.  Place on cardiac  monitoring.  Admit to hospitalist.  Candyce Churn, MD 12/08/12 [phone]

## 2012-12-07 NOTE — H&P (Addendum)
[facility] History and Physical  [name] JWJ:[id number] DOB: 30-Nov-1924 DOA: 12/07/2012  Referring physician: ER physician PCP: Nelwyn Salisbury, MD   Chief Complaint: fatigue  HPI:  79 year old female with past medical history of hypertension, dyslipidemia, hypothyroidism, Myasthenia Gravis, CHF (last 2 D ECHO on file in 2009 with EF 55 - 60%), no previous BNP available who presented to [facility] ED after being sent by PCP for low potassium. For some time, patient has had progressive weakness/ fatigue and poor oral intake which has promoted the visit to PCP. She had her blood work done there which was significant for potassium of 2.1. Patient has no other complaints. No chest pain, no palpitations, no shortness of breath. No abdominal pain, no nausea or vomiting. She does endorse chronic diarrhea. No lightheadedness or loss of consciousness. In ED, BP is 106/50, HR 74, Y 98 F and O2 saturation 94% on room air. CBC revealed hemoglobin of 9.2 and BMP revealed potassium of 2.1 and creatinine of 1.4 (previous values were within normal limits).   Assessment and Plan:  Principal Problem:   Hypokalemia - likely related to chronic diarrhea - repleted in ED - follow up admission potassium - pt has QTc prolongation; spoke with cardio oncall [facility]), recommended repletion of electrolytes and repeating EKG then. - check troponin level - check TSH Active Problems:   HYPOTHYROIDISM - continue synthroid - check TSH   HYPERLIPIDEMIA - continue fish oil and atorvastatin   ANEMIA-NOS - hemoglobin is 8.3 - check FOBT - continue to monitor CBC   ANXIETY - continue xanax   Myasthenia gravis without exacerbation - continue cellcept   HYPERTENSION - hold irbesartan-hctz due to acute renal failure - will hold of on antihypertensives due to soft BP   Acute renal failure - perhaps due to dehydration, prerenal etiology - continue IV fluids for next 12 hours - follow up BMP in am - creatinine  already trending down  Manson Passey [facility] [phone]  Review of Systems:  Constitutional: Negative for fever, chills and positive for malaise/fatigue. Negative for diaphoresis.  HENT: Negative for hearing loss, ear pain, nosebleeds, congestion, sore throat, neck pain, tinnitus and ear discharge.   Eyes: Negative for blurred vision, double vision, photophobia, pain, discharge and redness.  Respiratory: Negative for cough, hemoptysis, sputum production, shortness of breath, wheezing and stridor.   Cardiovascular: Negative for chest pain, palpitations, orthopnea, claudication and leg swelling.  Gastrointestinal: Negative for nausea, vomiting and abdominal pain. Negative for heartburn, constipation, blood in stool and melena.  Has chronic diarrhea. Genitourinary: Negative for dysuria, urgency, frequency, hematuria and flank pain.  Musculoskeletal: Negative for myalgias, back pain, joint pain and falls.  Skin: Negative for itching and rash.  Neurological: Negative for dizziness and weakness. Negative for tingling, tremors, sensory change, speech change, focal weakness, loss of consciousness and headaches.  Endo/Heme/Allergies: Negative for environmental allergies and polydipsia. Does not bruise/bleed easily.  Psychiatric/Behavioral: Negative for suicidal ideas. The patient is not nervous/anxious.      Past Medical History  Diagnosis Date  . Anemia   . Hyperlipemia   . Hypertension   . Thyroid disease     hypothyroidism  . Anxiety   . Paroxysmal atrial fibrillation   . Vitamin B 12 deficiency   . CHF (congestive heart failure)   . TIA (transient ischemic attack)     4-09  . Microscopic hematuria     benign microscopic hematuria, worked up b Dr Alexis Frock  . Gall stone  porcelain gall bladder with single stone  per CT 02-2009 Dr Birdie Sons  . Osteoporosis     DEXA on 04-30-10  . Myasthenia gravis     sees Dr. Terrace Arabia    Past Surgical History  Procedure Laterality Date  . Cardiac  catheterization      04/1999  . Abdominal hysterectomy      with oophorectomy  . Colonoscopy  1999    normal    Social History:  reports that she has never smoked. She has never used smokeless tobacco. She reports that she does not drink alcohol or use illicit drugs.  Allergies  Allergen Reactions  . Furosemide     REACTION: unspecified  . Sulfamethoxazole     REACTION: unspecified    Family History: .Family medical history significant for HTN, HLD   Prior to Admission medications   Medication Sig Start Date End Date Taking? Authorizing Provider  ALPRAZolam (XANAX) 0.25 MG tablet Take 1 tablet (0.25 mg total) by mouth 2 (two) times daily as needed for anxiety. 12/07/12  Yes Nelwyn Salisbury, MD  ALPRAZolam Prudy Feeler) 1 MG tablet Take 1 tablet (1 mg total) by mouth 3 (three) times daily as needed for anxiety. 09/26/12  Yes Nelwyn Salisbury, MD  atorvastatin (LIPITOR) 20 MG tablet Take 20 mg by mouth daily.  03/25/12  Yes Nelwyn Salisbury, MD  buPROPion (WELLBUTRIN XL) 150 MG 24 hr tablet Take 1 tablet (150 mg total) by mouth every morning. 03/07/12 03/07/13 Yes Nelwyn Salisbury, MD  clopidogrel (PLAVIX) 75 MG tablet Take 1 tablet (75 mg total) by mouth daily. 10/10/12  Yes Nilda Riggs, NP  co-enzyme Q-10 50 MG capsule Take 50 mg by mouth daily.   Yes Historical Provider, MD  fish oil-omega-3 fatty acids 1000 MG capsule Take 2 g by mouth daily.   Yes Historical Provider, MD  glucosamine-chondroitin 500-400 MG tablet Take 1 tablet by mouth 3 (three) times daily.   Yes Historical Provider, MD  irbesartan-hydrochlorothiazide (AVALIDE) 300-12.5 MG per tablet Take 1 tablet by mouth daily. 03/07/12  Yes Nelwyn Salisbury, MD  levothyroxine (SYNTHROID, LEVOTHROID) 125 MCG tablet Take 1 tablet (125 mcg total) by mouth daily. 03/07/12  Yes Nelwyn Salisbury, MD  Misc Natural Products (FLEX-A-MIN JOINT FLEX PO) Take by mouth.   Yes Historical Provider, MD  mycophenolate (CELLCEPT) 500 MG tablet Take 1,000 mg by  mouth 2 (two) times daily.    Yes Historical Provider, MD  Probiotic Product (PROBIOTIC DAILY PO) Take by mouth.   Yes Historical Provider, MD  ranitidine (ZANTAC) 150 MG tablet Take 150 mg by mouth daily.   Yes Historical Provider, MD  vitamin B-12 (CYANOCOBALAMIN) 1000 MCG tablet Take 1,000 mcg by mouth daily.   Yes Historical Provider, MD  vitamin C (ASCORBIC ACID) 250 MG tablet Take 250 mg by mouth daily.   Yes Historical Provider, MD   Physical Exam: Filed Vitals:   12/07/12 1824  BP: 106/50  Pulse: 74  Temp: 98.9 F (37.2 C)  TempSrc: Oral  Resp: 16  SpO2: 94%    Physical Exam  Constitutional: Appears well-developed and well-nourished. No distress.  HENT: Normocephalic. No tonsillar erythema or exudates Eyes: Conjunctivae and EOM are normal. PERRLA, no scleral icterus.  Neck: Normal ROM. Neck supple. No JVD. No tracheal deviation. No thyromegaly.  CVS: irregular rhythm, rate controlled, S1/S2 appreciated Pulmonary: Effort and breath sounds normal, no stridor, rhonchi, wheezes, rales.  Abdominal: Soft. BS +,  no distension, tenderness, rebound  or guarding.  Musculoskeletal: Normal range of motion. No edema and no tenderness.  Lymphadenopathy: No lymphadenopathy noted, cervical, inguinal. Neuro: Alert. Normal reflexes, muscle tone coordination. No cranial nerve deficit. Skin: Skin is warm and dry. No rash noted. Not diaphoretic. No erythema. No pallor.    Labs on Admission:  Basic Metabolic Panel:  Recent Labs Lab 12/07/12 1435 12/07/12 1850  NA 137 136  K 2.1* PENDING  CL 98 95*  CO2 28 29  GLUCOSE 106* 131*  BUN 23 24*  CREATININE 1.4* 1.30*  CALCIUM 8.6 8.7   Liver Function Tests:  Recent Labs Lab 12/07/12 1435  AST 17  ALT 16  ALKPHOS 78  BILITOT 0.9  PROT 5.6*  ALBUMIN 2.8*    Recent Labs Lab 12/07/12 1850  LIPASE 9*   No results found for this basename: AMMONIA,  in the last 168 hours CBC:  Recent Labs Lab 12/07/12 1435 12/07/12 1850   WBC 9.2 7.2  NEUTROABS 7.7  --   HGB 9.2* 8.3*  HCT 27.9* 25.4*  MCV 82.6 81.7  PLT 292.0 269   Cardiac Enzymes: No results found for this basename: CKTOTAL, CKMB, CKMBINDEX, TROPONINI,  in the last 168 hours BNP: No components found with this basename: POCBNP,  CBG:  Recent Labs Lab 12/07/12 1904  GLUCAP 131*    Radiological Exams on Admission: No results found.  EKG: QTc prolongation; old LBBB  Code Status: Full Family Communication: Pt at bedside Disposition Plan: Admit for further evaluation  Manson Passey, MD  [facility] Pager [phone]  If 7PM-7AM, please contact night-coverage [email or web address] Password [facility] 12/07/2012, 8:14 PM

## 2012-12-07 NOTE — Telephone Encounter (Signed)
I spoke with pt's daughter [name] and advised to take pt to ER either [facility] or [facility], per Dr. Fabian Sharp advice. Daughter did agree to take pt to ER.

## 2012-12-07 NOTE — ED Notes (Signed)
Pt and pt daughter report. Pt has had intermittent diarrhea, decreased appetite, and lethargy/weakness x2 months, denies pain. Pt went to pcp today, had bloodwork drawn. Potassium 2.1. pcp sent pt to ED

## 2012-12-07 NOTE — Progress Notes (Signed)
  Subjective:    Patient ID: [name], female    DOB: Aug 03, 1924, 79 y.o.   MRN: [id number]  HPI Here with her daughter for decreased appetite, weight loss, diarrhea, and dark stools for 2 months. Not much abdominal pain or nausea. No fever. She takes Zantac once daily.    Review of Systems  Constitutional: Positive for appetite change, fatigue and unexpected weight change. Negative for fever.  Respiratory: Negative.   Cardiovascular: Negative.   Gastrointestinal: Positive for diarrhea and blood in stool. Negative for nausea, vomiting, abdominal pain, constipation, abdominal distention and rectal pain.  Genitourinary: Negative.   Neurological: Negative.        Objective:   Physical Exam  Constitutional: She is oriented to person, place, and time. She appears well-developed and well-nourished. No distress.  Cardiovascular: Normal rate, regular rhythm, normal heart sounds and intact distal pulses.   Pulmonary/Chest: Effort normal and breath sounds normal.  Abdominal: Soft. Bowel sounds are normal. She exhibits no distension and no mass. There is no tenderness. There is no rebound and no guarding.  Neurological: She is alert and oriented to person, place, and time.          Assessment & Plan:  This could result from several etiologies including ulcer disease or inflammatory bowel disease. She will stay on Zantac but increase this to bid. She can use Imodium prn. Get labs today and set up an abdominal and pelvic CT soon.

## 2012-12-08 DIAGNOSIS — G7000 Myasthenia gravis without (acute) exacerbation: Secondary | ICD-10-CM

## 2012-12-08 LAB — POTASSIUM
Potassium: 2.5 mEq/L — CL (ref 3.5–5.1)
Potassium: 2.9 mEq/L — ABNORMAL LOW (ref 3.5–5.1)

## 2012-12-08 LAB — BASIC METABOLIC PANEL
CO2: 29 mEq/L (ref 19–32)
Calcium: 8.7 mg/dL (ref 8.4–10.5)
Chloride: 95 mEq/L — ABNORMAL LOW (ref 96–112)
Glucose, Bld: 131 mg/dL — ABNORMAL HIGH (ref 70–99)
Potassium: <2 mEq/L — CL (ref 3.5–5.1)
Sodium: 136 mEq/L (ref 135–145)

## 2012-12-08 LAB — COMPREHENSIVE METABOLIC PANEL
ALT: 11 U/L (ref 0–35)
AST: 15 U/L (ref 0–37)
AST: 14 U/L (ref 0–37)
Albumin: 2.2 g/dL — ABNORMAL LOW (ref 3.5–5.2)
Alkaline Phosphatase: 71 U/L (ref 39–117)
BUN: 20 mg/dL (ref 6–23)
CO2: 28 mEq/L (ref 19–32)
Calcium: 8 mg/dL — ABNORMAL LOW (ref 8.4–10.5)
Chloride: 103 mEq/L (ref 96–112)
Creatinine, Ser: 1.2 mg/dL — ABNORMAL HIGH (ref 0.50–1.10)
GFR calc Af Amer: 46 mL/min — ABNORMAL LOW (ref >90)
GFR calc non Af Amer: 40 mL/min — ABNORMAL LOW (ref >90)
Potassium: 2.2 mEq/L — CL (ref 3.5–5.1)
Total Bilirubin: 0.5 mg/dL (ref 0.3–1.2)

## 2012-12-08 LAB — CBC WITH DIFFERENTIAL/PLATELET
Basophils Relative: 0 % (ref 0–1)
Eosinophils Absolute: 0 10*3/uL (ref 0.0–0.7)
Eosinophils Relative: 1 % (ref 0–5)
HCT: 22.3 % — ABNORMAL LOW (ref 36.0–46.0)
Lymphs Abs: 0.5 10*3/uL — ABNORMAL LOW (ref 0.7–4.0)
Monocytes Absolute: 0.4 10*3/uL (ref 0.1–1.0)
Monocytes Relative: 8 % (ref 3–12)
RDW: 15.7 % — ABNORMAL HIGH (ref 11.5–15.5)
WBC: 4.7 10*3/uL (ref 4.0–10.5)

## 2012-12-08 LAB — TROPONIN I
Troponin I: <0.3 ng/mL (ref <0.30)
Troponin I: <0.3 ng/mL (ref <0.30)

## 2012-12-08 LAB — FERRITIN: Ferritin: 323 ng/mL — ABNORMAL HIGH (ref 10–291)

## 2012-12-08 LAB — CBC
MCH: 26.6 pg (ref 26.0–34.0)
MCHC: 32.3 g/dL (ref 30.0–36.0)
MCV: 82.5 fL (ref 78.0–100.0)
Platelets: 222 10*3/uL (ref 150–400)
RDW: 16.1 % — ABNORMAL HIGH (ref 11.5–15.5)

## 2012-12-08 LAB — PHOSPHORUS: Phosphorus: 2.4 mg/dL (ref 2.3–4.6)

## 2012-12-08 LAB — RETICULOCYTES
Retic Count, Absolute: 65.5 10*3/uL (ref 19.0–186.0)
Retic Ct Pct: 2.4 % (ref 0.4–3.1)

## 2012-12-08 LAB — IRON AND TIBC
Iron: 26 ug/dL — ABNORMAL LOW (ref 42–135)
UIBC: 126 ug/dL (ref 125–400)

## 2012-12-08 LAB — ABO/RH: ABO/RH(D): B POS

## 2012-12-08 LAB — MAGNESIUM: Magnesium: 1.8 mg/dL (ref 1.5–2.5)

## 2012-12-08 MED ORDER — POTASSIUM CHLORIDE CRYS ER 20 MEQ PO TBCR
40.0000 meq | EXTENDED_RELEASE_TABLET | ORAL | Status: AC
  Administered 2012-12-08: 40 meq via ORAL
  Filled 2012-12-08: qty 2

## 2012-12-08 MED ORDER — POTASSIUM CHLORIDE 10 MEQ/100ML IV SOLN
10.0000 meq | INTRAVENOUS | Status: AC
  Administered 2012-12-08 (×4): 10 meq via INTRAVENOUS
  Filled 2012-12-08 (×4): qty 100

## 2012-12-08 MED ORDER — POTASSIUM CHLORIDE CRYS ER 20 MEQ PO TBCR
40.0000 meq | EXTENDED_RELEASE_TABLET | Freq: Once | ORAL | Status: AC
  Administered 2012-12-08: 40 meq via ORAL
  Filled 2012-12-08: qty 2

## 2012-12-08 MED ORDER — POTASSIUM CHLORIDE CRYS ER 20 MEQ PO TBCR
60.0000 meq | EXTENDED_RELEASE_TABLET | Freq: Once | ORAL | Status: AC
  Administered 2012-12-08: 60 meq via ORAL
  Filled 2012-12-08: qty 3

## 2012-12-08 NOTE — Progress Notes (Signed)
Pt daughter states that last time she has a CT scan with contrast she began to itch all over. Pt's daughter was uncertain if that may be a test she has done while at the hospital.

## 2012-12-08 NOTE — Progress Notes (Signed)
[facility] PROGRESS NOTE  [name] WUJ:[id number] DOB: [birth year] DOA: 12/07/2012 PCP: Nelwyn Salisbury, MD  Assessment/Plan: Principal Problem:   Hypokalemia Active Problems:   HYPOTHYROIDISM   HYPERLIPIDEMIA   ANEMIA-NOS   ANXIETY   Myasthenia gravis without exacerbation   HYPERTENSION   Acute renal failure    1. Hypokalemia: Patient presented with progressive fatigue/weakness, and was found to have a profound hypokalemia, with potassium of 2.1, in the setting of chronic diarrhea, dehydration and continued HCTZ therapy. Fortunately, magnesium is normal. Repleting as indicated, and monitoring telemetrically. QTc prolongation noted. EKG will be rechecked, aftyer resolution of hypokalemia. TSH is normal at 1.41.  2. Diarrhea: Per patient, she has had frequent loose stools for about 2.5 months, symptomatically addressed with Lomotil. Still an ongoing problem. Tghis is contributory to her electrolyte abnormality. Will check stool pathogens, including C. Difficile PCR, and request GI consultation.  3. Acute renal failure: Creatinine at presentation, was 1.4, against a known baseline creatinine of 1.2 on 03/07/2012. This is likely AKI, secondary to dehydration. Managing with iv fluids, following renal indices.  4. HTN: BP appears reasonable at this time. Irbesartan-Hctz due to AKI. Observing for now.  5. Anemia: Patient has a normocytic anemia, and known Vitamin B12 deficiency.  Baseline HB was 10.1 on 03/07/2012. At presentation, HB was 9.2, but following hydration, has dropped to 7.2-7.3 this AM. Have sent off anemia panel, and will administer a blood transfusion. FOBT is pending. Patient states that her last colonoscopy was in 1999. Repeat colonoscopy appears indicated.  6. Hypothyroidism: On thyroxine replacement therapy. TSH is normal at 1.41.  7. Hyperlipidemia: On Fish oil and Statin. 8. Anxiety: Stable on Xanax. 9. Myasthenia gravis: No clinical exacerbation. Continued on  Cellcept   Code Status: Full Code.  Family Communication:  Disposition Plan: To be determined.    Brief narrative: 79 year old female with history of chronic anemia, Vitamin B12 deficiency, hypertension, dyslipidemia, osteoporosis, hypothyroidism, Myasthenia Gravis, CHF (last 2 D ECHO on file in 2009 with EF 55 - 60%), PAF, TIA, cholelithiasis/Porclain gall bladder, presenting to [facility] ED after being sent by PCP for low potassium. For some time, patient has had progressive weakness/fatigue and poor oral intake which necessitated the visit to PCP. She had her blood work done there which was significant for potassium of 2.1. Patient has no other complaints. No chest pain, palpitations, shortness of breath, abdominal pain, nausea or vomiting. She does endorse chronic diarrhea. In ED, BP was 106/50, HR 74, Temp 98 F and O2 saturation 94% on room air. CBC revealed hemoglobin of 9.2 and BMP revealed potassium of 2.1 and creatinine of 1.4 (previous values were within normal limits). Admitted for further management.    Consultants:  N/A.   Procedures:  N/A.   Antibiotics:  N/A.   HPI/Subjective: No new issues.   Objective: Vital signs in last 24 hours: Temp:  [98 F (36.7 C)-99.4 F (37.4 C)] 99.4 F (37.4 C) (07/19 0436) Pulse Rate:  [74-85] 76 (07/19 0436) Resp:  [16-18] 18 (07/19 0436) BP: (106-139)/(50-63) 120/56 mmHg (07/19 0436) SpO2:  [94 %-100 %] 98 % (07/19 0436) Weight:  [88.451 kg (195 lb)-89.9 kg (198 lb 3.1 oz)] 89.9 kg (198 lb 3.1 oz) (07/19 0436) Weight change:  Last BM Date: 12/07/12  Intake/Output from previous day: 07/18 0701 - 07/19 0700 In: -  Out: 3 [Urine:2; Stool:1]     Physical Exam: General: Comfortable, alert, communicative, fully oriented, not short of breath at rest.  HEENT:  Moderate clinical pallor, no jaundice, no conjunctival injection or discharge. Hydration is fair.  NECK:  Supple, JVP not seen, no carotid bruits, no palpable lymphadenopathy,  no palpable goiter. CHEST:  Clinically clear to auscultation, no wheezes, no crackles. HEART:  Sounds 1 and 2 heard, normal, regular, no murmurs. ABDOMEN:  Obese, has umbilical hernia and old laparotomy scar, soft, non-tender, no palpable organomegaly, no palpable masses, normal bowel sounds. GENITALIA:  Not examined. LOWER EXTREMITIES:  Mild pitting edema, palpable peripheral pulses. MUSCULOSKELETAL SYSTEM:  Generalized osteoarthritic changes, otherwise, normal. CENTRAL NERVOUS SYSTEM:  No focal neurologic deficit on gross examination.  Lab Results:  Recent Labs  12/08/12 0026 12/08/12 0417  WBC 4.7 4.5  HGB 7.2* 7.3*  HCT 22.3* 22.6*  PLT 234 222    Recent Labs  12/07/12 1850 12/08/12 0026  NA 136 139  K PENDING 2.2*  CL 95* 100  CO2 29 28  GLUCOSE 131* 98  BUN 24* 20  CREATININE 1.30* 1.20*  CALCIUM 8.7 8.0*   No results found for this or any previous visit (from the past 240 hour(s)).   Studies/Results: No results found.  Medications: Scheduled Meds: . atorvastatin  20 mg Oral q1800  . buPROPion  150 mg Oral Daily  . clopidogrel  75 mg Oral Q breakfast  . famotidine  20 mg Oral Daily  . levothyroxine  125 mcg Oral Q breakfast  . mycophenolate  1,000 mg Oral BID  . omega-3 acid ethyl esters  1 g Oral Daily  . potassium chloride  10 mEq Intravenous Q1 Hr x 4  . sodium chloride  3 mL Intravenous Q12H  . vitamin B-12  1,000 mcg Oral Daily  . vitamin C  250 mg Oral Daily   Continuous Infusions: . sodium chloride 75 mL/hr (12/07/12 2207)   PRN Meds:.acetaminophen, acetaminophen, ALPRAZolam, HYDROcodone-acetaminophen, morphine injection, ondansetron (ZOFRAN) IV, ondansetron    LOS: 1 day   Grissel Marks,Lauren  [facility] Pager [phone]. If 8PM-8AM, please contact night-coverage at [email or web address], password [facility] 12/08/2012, 7:50 AM  LOS: 1 day

## 2012-12-08 NOTE — Progress Notes (Signed)
CRITICAL VALUE ALERT  Critical value received:  K 2.2  Date of notification:  12/08/2012  Time of notification:  0652  Critical value read back:yes  Nurse who received alert:  Mignon Pine, RN  MD notified (1st page):  NP notified- Lynch    Time of first page:  0654  MD notified (2nd page):  Time of second page:  Responding MD:  NP-Lynch   Time MD responded:  [phone]

## 2012-12-08 NOTE — Progress Notes (Signed)
PT Cancellation Note  Patient Details Name: [name] MRN: [id number] DOB: May 21, 1925   Cancelled Treatment:     PT deferred this date.  Pt with K+ @ 2.2 and Hgb @ 7.3 with transfusion in progress.  Will follow in am.   Anthony Tamburo 12/08/2012, 4:18 PM

## 2012-12-08 NOTE — Progress Notes (Signed)
CRITICAL VALUE ALERT  Critical value received:  K 2.5  Date of notification:  12/08/12  Time of notification:  1319  Critical value read back:yes  Nurse who received alert:  Joaquin Music, RN  MD notified (1st page):  Dr. Brien Few  Time of first page:  1320    MD notified (2nd page):  Time of second page:  Responding MD:  Dr. Brien Few  Time MD responded:  [phone]

## 2012-12-09 ENCOUNTER — Encounter: Payer: Self-pay | Admitting: Family Medicine

## 2012-12-09 DIAGNOSIS — E039 Hypothyroidism, unspecified: Secondary | ICD-10-CM

## 2012-12-09 DIAGNOSIS — R197 Diarrhea, unspecified: Secondary | ICD-10-CM

## 2012-12-09 DIAGNOSIS — A0839 Other viral enteritis: Secondary | ICD-10-CM

## 2012-12-09 LAB — COMPREHENSIVE METABOLIC PANEL
ALT: 10 U/L (ref 0–35)
AST: 15 U/L (ref 0–37)
Alkaline Phosphatase: 66 U/L (ref 39–117)
Calcium: 7.7 mg/dL — ABNORMAL LOW (ref 8.4–10.5)
Glucose, Bld: 110 mg/dL — ABNORMAL HIGH (ref 70–99)
Potassium: 2.8 mEq/L — ABNORMAL LOW (ref 3.5–5.1)
Sodium: 140 mEq/L (ref 135–145)
Total Protein: 4.3 g/dL — ABNORMAL LOW (ref 6.0–8.3)

## 2012-12-09 LAB — CBC
Hemoglobin: 7.9 g/dL — ABNORMAL LOW (ref 12.0–15.0)
MCH: 27.2 pg (ref 26.0–34.0)
MCHC: 32.2 g/dL (ref 30.0–36.0)
Platelets: 203 10*3/uL (ref 150–400)
RBC: 2.9 MIL/uL — ABNORMAL LOW (ref 3.87–5.11)

## 2012-12-09 MED ORDER — FUROSEMIDE 10 MG/ML IJ SOLN
20.0000 mg | Freq: Once | INTRAMUSCULAR | Status: AC
  Administered 2012-12-09: 20 mg via INTRAVENOUS
  Filled 2012-12-09: qty 2

## 2012-12-09 MED ORDER — BOOST / RESOURCE BREEZE PO LIQD
1.0000 | Freq: Two times a day (BID) | ORAL | Status: DC
  Administered 2012-12-09 – 2012-12-11 (×2): 1 via ORAL

## 2012-12-09 MED ORDER — PEG-KCL-NACL-NASULF-NA ASC-C 100 G PO SOLR: 1.0000 | Freq: Once | ORAL | Status: DC

## 2012-12-09 MED ORDER — PEG-KCL-NACL-NASULF-NA ASC-C 100 G PO SOLR
0.5000 | Freq: Once | ORAL | Status: AC
  Administered 2012-12-10: 50 g via ORAL
  Filled 2012-12-09: qty 1

## 2012-12-09 MED ORDER — PEG-KCL-NACL-NASULF-NA ASC-C 100 G PO SOLR
0.5000 | Freq: Once | ORAL | Status: AC
  Administered 2012-12-09: 50 g via ORAL
  Filled 2012-12-09: qty 1

## 2012-12-09 MED ORDER — POTASSIUM CHLORIDE CRYS ER 20 MEQ PO TBCR
40.0000 meq | EXTENDED_RELEASE_TABLET | Freq: Once | ORAL | Status: AC
  Administered 2012-12-09: 40 meq via ORAL
  Filled 2012-12-09: qty 2

## 2012-12-09 MED ORDER — POTASSIUM CHLORIDE CRYS ER 20 MEQ PO TBCR
40.0000 meq | EXTENDED_RELEASE_TABLET | ORAL | Status: AC
  Administered 2012-12-09: 40 meq via ORAL
  Filled 2012-12-09: qty 2

## 2012-12-09 MED ORDER — ENSURE COMPLETE PO LIQD: 237.0000 mL | Freq: Every day | ORAL | Status: DC

## 2012-12-09 NOTE — Progress Notes (Signed)
INITIAL NUTRITION ASSESSMENT  DOCUMENTATION CODES Per approved criteria  -moderate malnutrition in the context of chronic illness  Pt meets criteria for moderate MALNUTRITION in the context of chronic illness as evidenced by reported intake <75% of estimated needs for >1 month and weight loss of 10% in 6 months.  INTERVENTION: 1. Discussed with pt foods that are less likely to cause diarrhea.  2. Resource Breeze po BID, each supplement provides 250 kcal and 9 grams of protein. 3. Ensure Complete po once daily, each supplement provides 350 kcal and 13 grams of protein.  NUTRITION DIAGNOSIS: Inadequate oral intake related to diarrhea as evidenced by weight loss.   Goal: Pt to meet >/= 90% of their estimated nutrition needs   Monitor:  Weight, po intake, labs; K  Reason for Assessment: Consult  79 y.o. female  Admitting Dx: Hypokalemia  ASSESSMENT: Pt admitted with past medical history of hypertension, dyslipidemia, hypothyroidism, Myasthenia Gravis, CHF after being sent to [facility] by PCP with low potassium. Pt has had progressive weakness/fatigue and poor po intake which promoted visit to PCP.  Pt reports that she has had poor po intake due to all food causing her to have diarrhea. She reports weighing 221 lbs in March which reflects a 10% weight loss in about 4 months. She has been experiencing changes in taste and says that foods she usually likes now taste unpleasant. Per RN, pt is still receiving work-up for root cause of diarrhea. Her potassium is slowly improving from 2.2 on admission and is currently 2.8.   Height: Ht Readings from Last 1 Encounters:  12/07/12 5\' 6"  (1.676 m)    Weight: Wt Readings from Last 1 Encounters:  12/09/12 198 lb 6.6 oz (90 kg)    Ideal Body Weight: 63.8 kg  % Ideal Body Weight: 141%  Wt Readings from Last 10 Encounters:  12/09/12 198 lb 6.6 oz (90 kg)  12/07/12 195 lb (88.451 kg)  10/10/12 204 lb (92.534 kg)  03/07/12 218 lb (98.884 kg)   03/02/11 230 lb (104.327 kg)  02/22/10 231 lb (104.781 kg)  04/29/09 232 lb (105.235 kg)  02/17/09 237 lb (107.502 kg)  12/26/07 236 lb (107.049 kg)    Usual Body Weight: 220 lbs  % Usual Body Weight: 90%  BMI:  Body mass index is 32.04 kg/(m^2).  Estimated Nutritional Needs: Kcal: 2250-2450 Protein: 110-120 g Fluid: 2.3-2.5 L  Skin: healed wounds on buttocks  Diet Order: General  EDUCATION NEEDS: -Education needs addressed   Intake/Output Summary (Last 24 hours) at 12/09/12 1111 Last data filed at 12/09/12 0600  Gross per 24 hour  Intake 1342.5 ml  Output    700 ml  Net  642.5 ml    Last BM: 7/20   Labs:   Recent Labs Lab 12/07/12 1850 12/08/12 0026 12/08/12 0417 12/08/12 1235 12/08/12 1910 12/09/12 0435  NA 136 139 141  --   --  140  K <2.0* 2.2* 2.2* 2.5* 2.9* 2.8*  CL 95* 100 103  --   --  107  CO2 29 28 29   --   --  27  BUN 24* 20 18  --   --  10  CREATININE 1.30* 1.20* 1.13*  --   --  0.87  CALCIUM 8.7 8.0* 7.8*  --   --  7.7*  MG  --  1.8  --   --   --   --   PHOS  --  2.4  --   --   --   --  GLUCOSE 131* 98 107*  --   --  110*    CBG (last 3)   Recent Labs  12/07/12 1904 12/08/12 0740 12/09/12 0735  GLUCAP 131* 106* 101*    Scheduled Meds: . atorvastatin  20 mg Oral q1800  . buPROPion  150 mg Oral Daily  . clopidogrel  75 mg Oral Q breakfast  . famotidine  20 mg Oral Daily  . furosemide  20 mg Intravenous Once  . levothyroxine  125 mcg Oral Q breakfast  . mycophenolate  1,000 mg Oral BID  . omega-3 acid ethyl esters  1 g Oral Daily  . potassium chloride  40 mEq Oral Once  . sodium chloride  3 mL Intravenous Q12H  . vitamin B-12  1,000 mcg Oral Daily  . vitamin C  250 mg Oral Daily    Continuous Infusions:   Past Medical History  Diagnosis Date  . Anemia   . Hyperlipemia   . Hypertension   . Thyroid disease     hypothyroidism  . Anxiety   . Paroxysmal atrial fibrillation   . Vitamin B 12 deficiency   . CHF  (congestive heart failure)   . TIA (transient ischemic attack)     4-09  . Microscopic hematuria     benign microscopic hematuria, worked up b Dr Alexis Frock  . Gall stone     porcelain gall bladder with single stone  per CT 02-2009 Dr Birdie Sons  . Osteoporosis     DEXA on 04-30-10  . Myasthenia gravis     sees Dr. Terrace Arabia     Past Surgical History  Procedure Laterality Date  . Cardiac catheterization      04/1999  . Abdominal hysterectomy      with oophorectomy  . Colonoscopy  1999    normal     Ebbie Latus RD, LDN

## 2012-12-09 NOTE — Progress Notes (Signed)
Pt has had 2 stools in past 11 hours.  Moderate in size, liquidy light brown c food particles.

## 2012-12-09 NOTE — Consult Note (Signed)
[facility] Consultation  Referring Provider:   [facility] Primary Care Physician:  Nelwyn Salisbury, MD Primary Gastroenterologist:   none      Reason for Consultation: diarrhea             HPI:   [name]  female originally from [place] admitted through ED with weakness and hypokalemia.. She has had loose stools since April.  Loose stools initialy started with a day or so of diarrhea here and there. Episodes became more frequent, she has lost about 20 pounds. Stools non-bloody and usually occur 4-5 hours after eating. She cannot correlate diarrhea and weight loss with any medication changes. No nocturnal diarrhea.  No significant abdominal pain.Her last colonoscopy was in 1999 in [place], it was "fine"   Patient has also been found to be anemic. Her hgb in Oct 2013 hgb ranged between 10 and 12. In ED two days ago it was 9.2,  now down to 7.3. Iron studies don't suggest iron deficiency. No NSAID use. Patient was anemic as a child.   Patient describes significant weight loss since April (20pounds since PCP visit October 2013. She has a poor appetite. No nausea  Past Medical History  Diagnosis Date  . Anemia   . Hyperlipemia   . Hypertension   . Thyroid disease     hypothyroidism  . Anxiety   . Paroxysmal atrial fibrillation   . Vitamin B 12 deficiency   . CHF (congestive heart failure)   . TIA (transient ischemic attack)     4-09  . Microscopic hematuria     benign microscopic hematuria, worked up b Dr Alexis Frock  . Gall stone     porcelain gall bladder with single stone  per CT 02-2009 Dr Birdie Sons  . Osteoporosis     DEXA on 04-30-10  . Myasthenia gravis     sees Dr. Terrace Arabia     Past Surgical History  Procedure Laterality Date  . Cardiac catheterization      04/1999  . Abdominal hysterectomy      with oophorectomy  . Colonoscopy  1999    normal    [facility]:  Daughter died for colon cancer. No known IBD in family   History  Substance Use  Topics  . Smoking status: Never Smoker   . Smokeless tobacco: Never Used  . Alcohol Use: No    Prior to Admission medications   Medication Sig Start Date End Date Taking? Authorizing Provider  ALPRAZolam (XANAX) 0.25 MG tablet Take 1 tablet (0.25 mg total) by mouth 2 (two) times daily as needed for anxiety. 12/07/12  Yes Nelwyn Salisbury, MD  ALPRAZolam Prudy Feeler) 1 MG tablet Take 1 tablet (1 mg total) by mouth 3 (three) times daily as needed for anxiety. 09/26/12  Yes Nelwyn Salisbury, MD  atorvastatin (LIPITOR) 20 MG tablet Take 20 mg by mouth daily.  03/25/12  Yes Nelwyn Salisbury, MD  buPROPion (WELLBUTRIN XL) 150 MG 24 hr tablet Take 1 tablet (150 mg total) by mouth every morning. 03/07/12 03/07/13 Yes Nelwyn Salisbury, MD  clopidogrel (PLAVIX) 75 MG tablet Take 1 tablet (75 mg total) by mouth daily. 10/10/12  Yes Nilda Riggs, NP  co-enzyme Q-10 50 MG capsule Take 50 mg by mouth daily.   Yes Historical Provider, MD  fish oil-omega-3 fatty acids 1000 MG capsule Take 2 g by mouth daily.   Yes Historical Provider, MD  glucosamine-chondroitin 500-400 MG tablet Take 1 tablet by mouth 3 (three)  times daily.   Yes Historical Provider, MD  irbesartan-hydrochlorothiazide (AVALIDE) 300-12.5 MG per tablet Take 1 tablet by mouth daily. 03/07/12  Yes Nelwyn Salisbury, MD  levothyroxine (SYNTHROID, LEVOTHROID) 125 MCG tablet Take 1 tablet (125 mcg total) by mouth daily. 03/07/12  Yes Nelwyn Salisbury, MD  Misc Natural Products (FLEX-A-MIN JOINT FLEX PO) Take by mouth.   Yes Historical Provider, MD  mycophenolate (CELLCEPT) 500 MG tablet Take 1,000 mg by mouth 2 (two) times daily.    Yes Historical Provider, MD  Probiotic Product (PROBIOTIC DAILY PO) Take by mouth.   Yes Historical Provider, MD  ranitidine (ZANTAC) 150 MG tablet Take 150 mg by mouth daily.   Yes Historical Provider, MD  vitamin B-12 (CYANOCOBALAMIN) 1000 MCG tablet Take 1,000 mcg by mouth daily.   Yes Historical Provider, MD  vitamin C (ASCORBIC ACID)  250 MG tablet Take 250 mg by mouth daily.   Yes Historical Provider, MD    Current Facility-Administered Medications  Medication Dose Route Frequency Provider Last Rate Last Dose  . acetaminophen (TYLENOL) tablet 650 mg  650 mg Oral Q6H PRN Lauren Murray, MD       Or  . acetaminophen (TYLENOL) suppository 650 mg  650 mg Rectal Q6H PRN Lauren Murray, MD      . ALPRAZolam Prudy Feeler) tablet 1 mg  1 mg Oral TID PRN Lauren Murray, MD   1 mg at 12/08/12 2220  . atorvastatin (LIPITOR) tablet 20 mg  20 mg Oral q1800 Lauren Murray, MD   20 mg at 12/08/12 1704  . buPROPion (WELLBUTRIN XL) 24 hr tablet 150 mg  150 mg Oral Daily Lauren Murray, MD   150 mg at 12/09/12 1057  . clopidogrel (PLAVIX) tablet 75 mg  75 mg Oral Q breakfast Lauren Murray, MD   75 mg at 12/09/12 0981  . famotidine (PEPCID) tablet 20 mg  20 mg Oral Daily Lauren Murray, MD   20 mg at 12/09/12 1057  . furosemide (LASIX) injection 20 mg  20 mg Intravenous Once Laveda Norman, MD      . HYDROcodone-acetaminophen (NORCO/VICODIN) 5-325 MG per tablet 1-2 tablet  1-2 tablet Oral Q4H PRN Lauren Murray, MD   2 tablet at 12/09/12 0014  . levothyroxine (SYNTHROID, LEVOTHROID) tablet 125 mcg  125 mcg Oral Q breakfast Lauren Murray, MD   125 mcg at 12/09/12 [phone]  . morphine 2 MG/ML injection 1 mg  1 mg Intravenous Q4H PRN Lauren Murray, MD      . mycophenolate (CELLCEPT) capsule 1,000 mg  1,000 mg Oral BID Lauren Murray, MD   1,000 mg at 12/09/12 1056  . omega-3 acid ethyl esters (LOVAZA) capsule 1 g  1 g Oral Daily Lauren Murray, MD   1 g at 12/09/12 1057  . ondansetron (ZOFRAN) tablet 4 mg  4 mg Oral Q6H PRN Lauren Murray, MD       Or  . ondansetron [facility]) injection 4 mg  4 mg Intravenous Q6H PRN Lauren Murray, MD      . potassium chloride SA (K-DUR,KLOR-CON) CR tablet 40 mEq  40 mEq Oral Once Laveda Norman, MD      . sodium chloride 0.9 % injection 3 mL  3 mL Intravenous Q12H Lauren Murray, MD   3 mL at 12/09/12 1057  . vitamin B-12  (CYANOCOBALAMIN) tablet 1,000 mcg  1,000 mcg Oral Daily Lauren Murray, MD  1,000 mcg at 12/09/12 1057  . vitamin C (ASCORBIC ACID) tablet 250 mg  250 mg Oral Daily Lauren Murray, MD   250 mg at 12/09/12 1057    Allergies as of 12/07/2012 - Review Complete 12/07/2012  Allergen Reaction Noted  . Furosemide  11/10/2006  . Sulfamethoxazole  11/10/2006    Review of Systems:    All systems reviewed and negative except where noted in HPI.   Physical Exam:  Vital signs in last 24 hours: Temp:  [98.2 F (36.8 C)-98.9 F (37.2 C)] 98.2 F (36.8 C) (07/20 1132) Pulse Rate:  [61-72] 61 (07/20 1132) Resp:  [14-20] 18 (07/20 1132) BP: (118-153)/(50-63) 153/63 mmHg (07/20 1132) SpO2:  [97 %-99 %] 97 % (07/20 0511) Weight:  [198 lb 6.6 oz (90 kg)] 198 lb 6.6 oz (90 kg) (07/20 0511) Last BM Date: 12/09/12 General:   Pleasant white female in NAD Head:  Normocephalic and atraumatic. Eyes:   No icterus.   Conjunctiva pale. Ears:  Normal auditory acuity. Neck:  Supple; no masses felt Lungs:  Respirations even and unlabored. Lungs clear to auscultation bilaterally.   No wheezes, crackles, or rhonchi.  Heart:  Regular rate and rhythm;  murmur heard. Abdomen:  Soft, nondistended, nontender. Old lower surgical scar. Normal bowel sounds. Mid lower abdomen firm but ? Just scar tissue.  Rectal:  Light yellow heme positive stool   Msk:  Symmetrical without gross deformities.  Extremities:  Without edema. Neurologic:  Alert and  oriented x4;  grossly normal neurologically. Skin:  Intact without significant lesions or rashes. Cervical Nodes:  No significant cervical adenopathy. Psych:  Alert and cooperative. Normal affect.  LAB RESULTS:  Recent Labs  12/08/12 0026 12/08/12 0417 12/09/12 0435  WBC 4.7 4.5 4.3  HGB 7.2* 7.3* 7.9*  HCT 22.3* 22.6* 24.5*  PLT 234 222 203   BMET  Recent Labs  12/08/12 0026 12/08/12 0417 12/08/12 1235 12/08/12 1910 12/09/12 0435  NA 139 141  --   --  140   K 2.2* 2.2* 2.5* 2.9* 2.8*  CL 100 103  --   --  107  CO2 28 29  --   --  27  GLUCOSE 98 107*  --   --  110*  BUN 20 18  --   --  10  CREATININE 1.20* 1.13*  --   --  0.87  CALCIUM 8.0* 7.8*  --   --  7.7*   LFT  Recent Labs  12/07/12 1435  12/09/12 0435  PROT 5.6*  < > 4.3*  ALBUMIN 2.8*  < > 2.0*  AST 17  < > 15  ALT 16  < > 10  ALKPHOS 78  < > 66  BILITOT 0.9  < > 0.5  BILIDIR 0.2  --   --   < > = values in this interval not displayed. PT/INR  Recent Labs  12/08/12 0026  LABPROT 15.0  INR 1.21    PREVIOUS ENDOSCOPIES:            Normal colonoscopy in [place] in 1999 per patient   Impression / Plan:   33. 79 year old female with bowel changes (loose stools) and documented 20 pound weight loss since Oct 2013. On cellcept which can cause diarrhea but she has been on cellcept for years so doubt cause. On Ibesartan at home. A similar drug (Olmesartan ) can cause sprue like enteropathy but not sure Ibesartan can.  C-diff is negative.Pathogen panel pending. Doubt infectious. Patient  needs colonoscopy for bowel change, weight loss, anemia. The risks, benefits, and alternatives to colonoscopy with possible biopsy and possible polypectomy were discussed with the patient and she consents to proceed. She may need EGD as well (for weight loss) if colonoscopy negative.    2. Anemia. Iron studies don't suggest iron deficiency. No overt GI blood loss but heme +. Patient is on plavix.   3 .Chronic anti-platlelet therapy for what sounds like a TIA.  4. Longstanding myasthenia gravis   Thanks   LOS: 2 days   Willette Cluster  12/09/2012, 11:42 AM  Chart was reviewed and patient was examined. X-rays and lab were reviewed.  Patient has a several month history of diarrhea and weight loss. She presents with a profound anemia and Hemoccult-positive stools. Family history is notable for daughter with colon cancer. Abnormality the colon should be ruled out. Malabsorption causing diarrhea from  into these including sprue is less likely but should be considered. Other considerations include idiopathic colitis and microscopic colitis.  Plan to proceed with colonoscopy. Note patient is on Plavix. If negative will proceed with upper endoscopy and small bowel biopsies. The patient will be followed by Dr. Leone Payor.  Barbette Hair. Arlyce Dice, M.D., [facility] Cell [phone]

## 2012-12-09 NOTE — Progress Notes (Signed)
Please note guiac of stool from this evening has returned +.   Pt has only had that one stool the first 6 hours of my shift.  Stool was light brown, liquid c noted food particles and moderate in size.

## 2012-12-09 NOTE — Progress Notes (Signed)
[facility] PROGRESS NOTE  [name]:[id number] DOB: 18-Aug-1924 DOA: 12/07/2012 PCP: Nelwyn Salisbury, MD  Assessment/Plan: Principal Problem:   Hypokalemia Active Problems:   HYPOTHYROIDISM   HYPERLIPIDEMIA   ANEMIA-NOS   ANXIETY   Myasthenia gravis without exacerbation   HYPERTENSION   Acute renal failure    1. Hypokalemia: Patient presented with progressive fatigue/weakness, and was found to have a profound hypokalemia, with potassium of 2.1, in the setting of chronic diarrhea, dehydration and continued HCTZ therapy. Fortunately, magnesium is normal. Repleting as indicated, and monitoring telemetrically. QTc prolongation noted. EKG will be rechecked, after resolution of hypokalemia. TSH is normal at 1.41.  2. Diarrhea: Per patient, she has had frequent loose stools for about 2.5 months, symptomatically addressed with Lomotil. Still an ongoing problem. This is contributory to her electrolyte abnormality. C. Difficile PCR is negative, PCR for other stool pathogens in progress. Have consulted Dr Arlyce Dice, for GI consultation. Await recommendations.  3. Acute renal failure: Creatinine at presentation, was 1.4, against a known baseline creatinine of 1.2 on 03/07/2012. This is likely AKI, secondary to dehydration. Managing with iv fluids, and creatinine is 0.87 today. Resolved.  4. HTN: BP appears reasonable at this time. Holding Irbesartan-Hctz due to AKI. Observing for now.  5. Anemia: Patient has a normocytic anemia, and known Vitamin B12 deficiency.  Baseline HB was 10.1 on 03/07/2012. At presentation, HB was 9.2, but following hydration, has dropped to 7.2-7.3 in AM of 12/08/12. Anemia panel revealed iron deficiency and FOBT is positive. Patient states that her last colonoscopy was in 1999. Repeat colonoscopy appears indicated. Transfused 1 unit PRBC on 12/08/12, with bump in HB to 7.9. Will transfuse another unit today.  6. Hypothyroidism: On thyroxine replacement therapy. TSH is  normal at 1.41.  7. Hyperlipidemia: On Fish oil and Statin. 8. Anxiety: Stable on Xanax. 9. Myasthenia gravis: No clinical exacerbation. Continued on Cellcept   Code Status: Full Code.  Family Communication:  Disposition Plan: To be determined.    Brief narrative: 79 year old female with history of chronic anemia, Vitamin B12 deficiency, hypertension, dyslipidemia, osteoporosis, hypothyroidism, Myasthenia Gravis, CHF (last 2 D ECHO on file in 2009 with EF 55 - 60%), PAF, TIA, cholelithiasis/Porclain gall bladder, presenting to [facility] ED after being sent by PCP for low potassium. For some time, patient has had progressive weakness/fatigue and poor oral intake which necessitated the visit to PCP. She had her blood work done there which was significant for potassium of 2.1. Patient has no other complaints. No chest pain, palpitations, shortness of breath, abdominal pain, nausea or vomiting. She does endorse chronic diarrhea. In ED, BP was 106/50, HR 74, Temp 98 F and O2 saturation 94% on room air. CBC revealed hemoglobin of 9.2 and BMP revealed potassium of 2.1 and creatinine of 1.4 (previous values were within normal limits). Admitted for further management.    Consultants:  N/A.   Procedures:  N/A.   Antibiotics:  N/A.   HPI/Subjective: Feels much better. Had 2 liquid stools overnight.   Objective: Vital signs in last 24 hours: Temp:  [98.3 F (36.8 C)-98.9 F (37.2 C)] 98.8 F (37.1 C) (07/20 0511) Pulse Rate:  [62-72] 62 (07/20 0511) Resp:  [14-20] 18 (07/20 0511) BP: (118-139)/(50-55) 119/50 mmHg (07/20 0511) SpO2:  [97 %-99 %] 97 % (07/20 0511) Weight:  [90 kg (198 lb 6.6 oz)] 90 kg (198 lb 6.6 oz) (07/20 0511) Weight change: 0.1 kg (3.5 oz) Last BM Date: 12/09/12  Intake/Output from previous day: 07/19  0701 - 07/20 0700 In: 1342.5 [P.O.:1080; I.V.:250; Blood:12.5] Out: 900 [Urine:900]     Physical Exam: General: Comfortable, alert, communicative, fully oriented, not  short of breath at rest.  HEENT:  Mild clinical pallor, no jaundice, no conjunctival injection or discharge. Hydration is fair.  NECK:  Supple, JVP not seen, no carotid bruits, no palpable lymphadenopathy, no palpable goiter. CHEST:  Clinically clear to auscultation, no wheezes, no crackles. HEART:  Sounds 1 and 2 heard, normal, regular, no murmurs. ABDOMEN:  Obese, has umbilical hernia and old laparotomy scar, soft, non-tender, no palpable organomegaly, no palpable masses, normal bowel sounds. GENITALIA:  Not examined. LOWER EXTREMITIES:  Mild pitting edema, palpable peripheral pulses. MUSCULOSKELETAL SYSTEM:  Generalized osteoarthritic changes, otherwise, normal. CENTRAL NERVOUS SYSTEM:  No focal neurologic deficit on gross examination.  Lab Results:  Recent Labs  12/08/12 0417 12/09/12 0435  WBC 4.5 4.3  HGB 7.3* 7.9*  HCT 22.6* 24.5*  PLT 222 203    Recent Labs  12/08/12 0417  12/08/12 1910 12/09/12 0435  NA 141  --   --  140  K 2.2*  < > 2.9* 2.8*  CL 103  --   --  107  CO2 29  --   --  27  GLUCOSE 107*  --   --  110*  BUN 18  --   --  10  CREATININE 1.13*  --   --  0.87  CALCIUM 7.8*  --   --  7.7*  < > = values in this interval not displayed. Recent Results (from the past 240 hour(s))  CLOSTRIDIUM DIFFICILE BY PCR     Status: None   Collection Time    12/08/12  4:09 PM      Result Value Range Status   C difficile by pcr NEGATIVE  NEGATIVE Final     Studies/Results: No results found.  Medications: Scheduled Meds: . atorvastatin  20 mg Oral q1800  . buPROPion  150 mg Oral Daily  . clopidogrel  75 mg Oral Q breakfast  . famotidine  20 mg Oral Daily  . furosemide  20 mg Intravenous Once  . levothyroxine  125 mcg Oral Q breakfast  . mycophenolate  1,000 mg Oral BID  . omega-3 acid ethyl esters  1 g Oral Daily  . potassium chloride  40 mEq Oral Once  . potassium chloride  40 mEq Oral STAT  . sodium chloride  3 mL Intravenous Q12H  . vitamin B-12  1,000  mcg Oral Daily  . vitamin C  250 mg Oral Daily   Continuous Infusions:   PRN Meds:.acetaminophen, acetaminophen, ALPRAZolam, HYDROcodone-acetaminophen, morphine injection, ondansetron (ZOFRAN) IV, ondansetron    LOS: 2 days   Krishana Lutze,CHRISTOPHER  [facility] Pager [phone]. If 8PM-8AM, please contact night-coverage at [email or web address], password [email or web address] 12/09/2012, 9:52 AM  LOS: 2 days

## 2012-12-09 NOTE — Evaluation (Signed)
Physical Therapy Evaluation Patient Details Name: [name] MRN: [id number] DOB: 12/26/1924 Today's Date: 12/09/2012 Time: 4098-1191 PT Time Calculation (min): 19 min  PT Assessment / Plan / Recommendation History of Present Illness  Pt presents with hypokalemia and diarrhea with history of Myasthenia Gravis.  Overall min assist to min/guard on day of eval with use of RW.  Uses rollator at home (may need to assess safety with use of rollator).  At home with son and daughter in law.  Son is there 24/7.    Clinical Impression  Pt with low K+ and HGB (overall malnurished) on day of eval, however was able to ambulate in hallway without c/o light headedness or dizziness.  Pt states she uses rollator at home, therefore will need to assess safety with device before D/C.  Pt will benefit from skilled PT in acute venue to address deficits.  PT recommends HHPT for follow up at D/C to maximize pts safety and function.     PT Assessment  Patient needs continued PT services    Follow Up Recommendations  Home health PT    Does the patient have the potential to tolerate intense rehabilitation      Barriers to Discharge        Equipment Recommendations   (TBD, assess safety with rollator)    Recommendations for Other Services     Frequency Min 3X/week    Precautions / Restrictions Precautions Precautions: Fall Precaution Comments: low K and HGB Restrictions Weight Bearing Restrictions: No   Pertinent Vitals/Pain No pain      Mobility  Bed Mobility Bed Mobility: Supine to Sit Supine to Sit: 6: Modified independent (Device/Increase time) Details for Bed Mobility Assistance: increased time and use of handrail.  Transfers Transfers: Sit to Stand;Stand to Sit Sit to Stand: 4: Min assist;With upper extremity assist;From bed;From chair/3-in-1 Stand to Sit: 4: Min guard;With upper extremity assist;With armrests;To chair/3-in-1 Details for Transfer Assistance: Performed x 2 in order to  use 3in1 in restroom with cues for hand placement and safety.   Ambulation/Gait Ambulation/Gait Assistance: 4: Min assist Ambulation Distance (Feet): 100 Feet Assistive device: Rolling walker Ambulation/Gait Assistance Details: Cues for maintaining position inside RW and for upright posture.   Gait Pattern: Step-through pattern;Decreased stride length Gait velocity: decreased Stairs: No Wheelchair Mobility Wheelchair Mobility: No    Exercises     PT Diagnosis: Difficulty walking;Generalized weakness  PT Problem List: Decreased strength;Decreased activity tolerance;Decreased balance;Decreased mobility;Decreased knowledge of use of DME PT Treatment Interventions: DME instruction;Gait training;Functional mobility training;Therapeutic activities;Therapeutic exercise;Balance training;Patient/family education     PT Goals(Current goals can be found in the care plan section) Acute Rehab PT Goals Patient Stated Goal: to get stronger PT Goal Formulation: With patient Time For Goal Achievement: 12/16/12 Potential to Achieve Goals: Good  Visit Information  Last PT Received On: 12/09/12 Assistance Needed: +1 History of Present Illness: Pt presents with hypokalemia and diarrhea with history of Myasthenia Gravis.  Overall min assist to min/guard on day of eval with use of RW.  Uses rollator at home (may need to assess safety with use of rollator).  At home with son and daughter in law.  Son is there 24/7.         Prior Functioning  Home Living Family/patient expects to be discharged to:: Private residence Living Arrangements: Children Available Help at Discharge: Family;Available 24 hours/day Type of Home:  (townhome) Home Access: Level entry Home Layout: Two level;Able to live on main level with bedroom/bathroom Home Equipment: Dan Humphreys -  4 wheels Prior Function Level of Independence: Independent with assistive device(s) Communication Communication: No difficulties    Cognition   Cognition Arousal/Alertness: Awake/alert Behavior During Therapy: WFL for tasks assessed/performed Overall Cognitive Status: Within Functional Limits for tasks assessed    Extremity/Trunk Assessment Lower Extremity Assessment Lower Extremity Assessment: Generalized weakness Cervical / Trunk Assessment Cervical / Trunk Assessment: Kyphotic   Balance    End of Session PT - End of Session Equipment Utilized During Treatment: Gait belt Activity Tolerance: Patient limited by fatigue Patient left: in chair;with call bell/phone within reach;with chair alarm set Nurse Communication: Mobility status  GP     Vista Deck 12/09/2012, 10:32 AM

## 2012-12-10 ENCOUNTER — Encounter (HOSPITAL_COMMUNITY): Payer: Self-pay

## 2012-12-10 ENCOUNTER — Encounter (HOSPITAL_COMMUNITY)
Admission: EM | Disposition: A | Discharge status: Home / Self Care | Payer: Self-pay | Source: Home / Self Care | Attending: Internal Medicine

## 2012-12-10 DIAGNOSIS — E440 Moderate protein-calorie malnutrition: Secondary | ICD-10-CM | POA: Insufficient documentation

## 2012-12-10 DIAGNOSIS — K633 Ulcer of intestine: Secondary | ICD-10-CM | POA: Diagnosis present

## 2012-12-10 HISTORY — PX: COLONOSCOPY: SHX5424

## 2012-12-10 LAB — GI PATHOGEN PANEL BY PCR, STOOL
C difficile toxin A/B: NEGATIVE
Cryptosporidium by PCR: NEGATIVE
E coli 0157 by PCR: NEGATIVE
G lamblia by PCR: NEGATIVE
Salmonella by PCR: NEGATIVE

## 2012-12-10 LAB — TYPE AND SCREEN: Unit division: 0

## 2012-12-10 LAB — CBC
HCT: 27.8 % — ABNORMAL LOW (ref 36.0–46.0)
Hemoglobin: 9.1 g/dL — ABNORMAL LOW (ref 12.0–15.0)
MCH: 27.7 pg (ref 26.0–34.0)
MCV: 84.5 fL (ref 78.0–100.0)
RBC: 3.29 MIL/uL — ABNORMAL LOW (ref 3.87–5.11)
WBC: 3.9 10*3/uL — ABNORMAL LOW (ref 4.0–10.5)

## 2012-12-10 LAB — BASIC METABOLIC PANEL
BUN: 8 mg/dL (ref 6–23)
CO2: 27 mEq/L (ref 19–32)
Calcium: 8.1 mg/dL — ABNORMAL LOW (ref 8.4–10.5)
Chloride: 108 mEq/L (ref 96–112)
Creatinine, Ser: 0.81 mg/dL (ref 0.50–1.10)
Glucose, Bld: 113 mg/dL — ABNORMAL HIGH (ref 70–99)

## 2012-12-10 LAB — POTASSIUM: Potassium: 2.8 mEq/L — ABNORMAL LOW (ref 3.5–5.1)

## 2012-12-10 LAB — GLUCOSE, CAPILLARY: Glucose-Capillary: 113 mg/dL — ABNORMAL HIGH (ref 70–99)

## 2012-12-10 SURGERY — COLONOSCOPY
Anesthesia: Moderate Sedation

## 2012-12-10 MED ORDER — MIDAZOLAM HCL 10 MG/2ML IJ SOLN
INTRAMUSCULAR | Status: AC
  Filled 2012-12-10: qty 2

## 2012-12-10 MED ORDER — DIPHENHYDRAMINE HCL 50 MG/ML IJ SOLN
INTRAMUSCULAR | Status: AC
  Filled 2012-12-10: qty 1

## 2012-12-10 MED ORDER — FENTANYL CITRATE 0.05 MG/ML IJ SOLN
INTRAMUSCULAR | Status: DC | PRN
  Administered 2012-12-10 (×2): 25 ug via INTRAVENOUS

## 2012-12-10 MED ORDER — POTASSIUM CHLORIDE CRYS ER 20 MEQ PO TBCR
20.0000 meq | EXTENDED_RELEASE_TABLET | Freq: Once | ORAL | Status: AC
  Administered 2012-12-10: 20 meq via ORAL
  Filled 2012-12-10: qty 1

## 2012-12-10 MED ORDER — POTASSIUM CHLORIDE CRYS ER 20 MEQ PO TBCR
40.0000 meq | EXTENDED_RELEASE_TABLET | Freq: Three times a day (TID) | ORAL | Status: DC
  Administered 2012-12-11 – 2012-12-12 (×4): 40 meq via ORAL
  Filled 2012-12-10 (×7): qty 2

## 2012-12-10 MED ORDER — MIDAZOLAM HCL 5 MG/5ML IJ SOLN
INTRAMUSCULAR | Status: DC | PRN
  Administered 2012-12-10: 2 mg via INTRAVENOUS
  Administered 2012-12-10: 1 mg via INTRAVENOUS
  Administered 2012-12-10: 2 mg via INTRAVENOUS

## 2012-12-10 MED ORDER — POTASSIUM CHLORIDE CRYS ER 20 MEQ PO TBCR
40.0000 meq | EXTENDED_RELEASE_TABLET | Freq: Once | ORAL | Status: AC
  Administered 2012-12-10: 40 meq via ORAL
  Filled 2012-12-10: qty 2

## 2012-12-10 MED ORDER — SODIUM CHLORIDE 0.9 % IV SOLN
INTRAVENOUS | Status: DC
  Administered 2012-12-10: 500 mL via INTRAVENOUS

## 2012-12-10 MED ORDER — FENTANYL CITRATE 0.05 MG/ML IJ SOLN
INTRAMUSCULAR | Status: AC
  Filled 2012-12-10: qty 2

## 2012-12-10 NOTE — Clinical Documentation Improvement (Signed)
THIS DOCUMENT IS NOT A PERMANENT PART OF THE MEDICAL RECORD  Please update your documentation with the medical record to reflect your response to this query. If you need help knowing how to do this please call [phone].  12/10/12   Dear Dr. [facility],  In a better effort to capture your patient's severity of illness, reflect appropriate length of stay and utilization of resources, a review of the patient medical record has revealed the following indicators.    Based on your clinical judgment, please clarify and document in a progress note and/or discharge summary the clinical condition associated with the following supporting information:  In responding to this query please exercise your independent judgment.  The fact that a query is asked, does not imply that any particular answer is desired or expected.  12/09/12 Nutr note.Marland KitchenMarland Kitchen"Pt meets criteria for moderate MALNUTRITION in the context of chronic illness as evidenced by reported intake <75% of estimated needs for >1 month and weight loss of 10% in 6 months." After study and for accurate Dx specificity & severity please help validate nutrition assessment for cond being eval'd, mon'd & tx'd. Thank you  Possible Clinical Conditions? Mild Malnutrition  Moderate Malnutrition Severe Malnutrition   Protein Calorie Malnutrition Severe Protein Calorie Malnutrition Emaciation  Cachexia   Other Condition Cannot clinically determine   Supporting Information: Risk Factors: See Nutrition Assessment 12/09/12  Signs & Symptoms: See Nutrition Assessment 12/09/12  Diagnostics: See Nutrition Assessment 12/09/12  Treatment  See Nutrition Assessment 12/09/12  Nutrition Consult See Nutrition Assessment 12/09/12  You may use possible, probable, or suspect with inpatient documentation. possible, probable, suspected diagnoses MUST be documented at the time of discharge  Reviewed: additional documentation in the medical record; 12/11/12  per progr note" Malnutrition of moderate degree".orm   Thank You,  Toribio Harbour, RN, BSN, CCDS Certified Clinical Documentation Specialist Pager: [phone] Health Information Management [place]

## 2012-12-10 NOTE — Progress Notes (Signed)
PT Cancellation Note  Patient Details Name: [name] MRN: [id number] DOB: 19-Jul-1924   Cancelled Treatment:    Reason Eval/Treat Not Completed: pt awaiting colonoscopy-prefers to defer tx session today.Will check back another day.Thanks.  Rebeca Alert, MPT Pager: [phone]

## 2012-12-10 NOTE — Progress Notes (Signed)
Quick Note:  Pt went to the ER on 12/07/12 and this was addressed at that time. ______

## 2012-12-10 NOTE — Progress Notes (Signed)
Pt has just finished her bowel prep.  Stool is liquid golden brown with fine sediment.  Have notified on call and am awaiting response.

## 2012-12-10 NOTE — Progress Notes (Signed)
[facility] Progress Note  Subjective:  Feels ok.  No specific complaints.  BM's yellow with small amount of sediment.  Objective:  Vital signs in last 24 hours: Temp:  [97.8 F (36.6 C)-99.4 F (37.4 C)] 98.2 F (36.8 C) (07/21 0636) Pulse Rate:  [61-82] 80 (07/21 0636) Resp:  [16-18] 18 (07/21 0636) BP: (111-153)/(58-83) 111/72 mmHg (07/21 0636) SpO2:  [100 %] 100 % (07/21 0636) Weight:  [197 lb 15.6 oz (89.8 kg)] 197 lb 15.6 oz (89.8 kg) (07/21 0700) Last BM Date: 12/09/12 General:   Alert, Well-developed, in NAD Heart:  Regular rate and rhythm; no murmurs Pulm:  CTAB.  No W/R/R. Abdomen:  Soft, nontender and nondistended. Normal bowel sounds.  Old lower abdominal scar noted.  Mid lower abdomen firm but ? just scar tissue.  Extremities:  Without edema. Neurologic:  Alert and  oriented x4;  grossly normal neurologically. Psych:  Alert and cooperative. Normal mood and affect.  Intake/Output from previous day: 07/20 0701 - 07/21 0700 In: 1880 [P.O.:1480; I.V.:250; Blood:150] Out: 600 [Urine:600]  Lab Results:  Recent Labs  12/08/12 0417 12/09/12 0435 12/10/12 0445  WBC 4.5 4.3 3.9*  HGB 7.3* 7.9* 9.1*  HCT 22.6* 24.5* 27.8*  PLT 222 203 175   BMET  Recent Labs  12/08/12 0417  12/09/12 0435 12/09/12 1745 12/10/12 0445  NA 141  --  140  --  141  K 2.2*  < > 2.8* 3.4* 2.9*  CL 103  --  107  --  108  CO2 29  --  27  --  27  GLUCOSE 107*  --  110*  --  113*  BUN 18  --  10  --  8  CREATININE 1.13*  --  0.87  --  0.81  CALCIUM 7.8*  --  7.7*  --  8.1*  < > = values in this interval not displayed. LFT  Recent Labs  12/07/12 1435  12/09/12 0435  PROT 5.6*  < > 4.3*  ALBUMIN 2.8*  < > 2.0*  AST 17  < > 15  ALT 16  < > 10  ALKPHOS 78  < > 66  BILITOT 0.9  < > 0.5  BILIDIR 0.2  --   --   < > = values in this interval not displayed. PT/INR  Recent Labs  12/08/12 0026  LABPROT 15.0  INR 1.21   Assessment / Plan: 41. 79 year old female  with bowel changes (loose stools) and documented 20 pound weight loss since Oct 2013. On cellcept which can cause diarrhea but she has been on cellcept for years so doubt cause. On Irbesartan at home. A similar drug (Olmesartan ) can cause sprue like enteropathy, but this has not been documented with Irbesartan. C-diff is negative.  Pathogen panel negative as well.  Patient for colonoscopy today. 2. Anemia. Iron studies don't suggest iron deficiency. No overt GI blood loss but heme +.  Patient is on plavix.  S/p 2 units of PRBC's since admission. 3 .Chronic anti-platlelet therapy for what sounds like a TIA.  4. Longstanding myasthenia gravis  5.  Hypokalemia:  Likely secondary to GI losses.  Will give PO dose of potassium today.    LOS: 3 days   ZEHR, JESSICA D.  12/10/2012, 8:08 AM  Pager number [phone]   [facility] Attending  I have also seen and assessed the patient and agree with the above note.

## 2012-12-10 NOTE — Progress Notes (Signed)
This last stool was almost clear, yellow very little sediment

## 2012-12-10 NOTE — Op Note (Signed)
[facility] [address] [place], [address]   COLONOSCOPY PROCEDURE REPORT  PATIENT: Joany, Khatib  MR#: [id number] BIRTHDATE: 1925/03/15 , 88  yrs. old GENDER: Female ENDOSCOPIST: Iva Boop, MD, [facility] PROCEDURE DATE:  12/10/2012 PROCEDURE:   Colonoscopy with biopsy ASA CLASS:   Class III INDICATIONS:chronic diarrhea and unexplained diarrhea.   she is immunosuppressed on therapy for Myasthenia gravis MEDICATIONS: Fentanyl 50 mcg IV and Versed 5 mg IV  DESCRIPTION OF PROCEDURE:   After the risks benefits and alternatives of the procedure were thoroughly explained, informed consent was obtained.  A digital rectal exam revealed no abnormalities of the rectum.   The     endoscope was introduced through the anus and advanced to the cecum, which was identified by both the appendix and ileocecal valve. No adverse events experienced.   The quality of the prep was Moviprep fair  The instrument was then slowly withdrawn as the colon was fully examined.      COLON FINDINGS: Abnormal mucosa was found throughout the entire examined colon.  The mucosa was erythematous, friable and had loss of vascularity and superficial ulcers scattered throughout. Multiple biopsies were performed using cold forceps.   The colon mucosa was otherwise normal.  Unable to enter terminal ileum. Retroflexed views revealed no abnormalities.   .      .  The scope was withdrawn and the procedure completed. COMPLICATIONS: There were no complications.  ENDOSCOPIC IMPRESSION: 1.   Abnormal mucosa - colitis with superficial ulcers was found throughout the entire examined colon; multiple biopsies were performed using cold forceps 2.   The colon mucosa was otherwise normal, unable to neter ileum  RECOMMENDATIONS: Await biopsy results   eSigned:  Iva Boop, MD, [facility] 12/10/2012 6:31 PM

## 2012-12-10 NOTE — Progress Notes (Signed)
[facility] PROGRESS NOTE  [name] WUJ:[id number] DOB: Oct 27, 1924 DOA: 12/07/2012 PCP: Nelwyn Salisbury, MD  Assessment/Plan: Principal Problem:   Hypokalemia Active Problems:   HYPOTHYROIDISM   HYPERLIPIDEMIA   ANEMIA-NOS   ANXIETY   Myasthenia gravis without exacerbation   HYPERTENSION   Acute renal failure   Diarrhea    1. Hypokalemia: Patient presented with progressive fatigue/weakness, and was found to have a profound hypokalemia, with potassium of 2.1, in the setting of chronic diarrhea, dehydration and continued HCTZ therapy. Fortunately, magnesium is normal. Repleting as indicated, and monitoring telemetrically. QTc prolongation noted. EKG will be rechecked today. TSH is normal at 1.41.  2. Diarrhea: Per patient, she has had frequent loose stools for about 2.5 months, symptomatically addressed with Lomotil. Still an ongoing problem. This is contributory to her electrolyte abnormality. PCR C. Difficile and other stool pathogens are negative. Dr Melvia Heaps provided GI consultation, and patient is scheduled for endoscopic evaluation today.  3. Acute renal failure: Creatinine at presentation, was 1.4, against a known baseline creatinine of 1.2 on 03/07/2012. This is likely AKI, secondary to dehydration. Managing with iv fluids, and as of 12/09/12, creatinine was 0.87. Resolved.  4. HTN: BP appears reasonable at this time. Holding Irbesartan-Hctz due to AKI. Observing for now.  5. Anemia: Patient has a normocytic anemia, and known Vitamin B12 deficiency.  Baseline HB was 10.1 on 03/07/2012. At presentation, HB was 9.2, but following hydration, dropped to 7.2-7.3 in AM of 12/08/12. Anemia panel revealed iron deficiency and FOBT was positive. Patient states that her last colonoscopy was in 1999. Transfused a total of 2 units PRBC with satisfactory bump in HB to 9.1. As described above, endoscopic evaluation is scheduled for today. .  6. Hypothyroidism: On thyroxine replacement  therapy. TSH is normal at 1.41.  7. Hyperlipidemia: On Fish oil and Statin. 8. Anxiety: Stable on Xanax. 9. Myasthenia gravis: No clinical exacerbation. Continued on Cellcept   Code Status: Full Code.  Family Communication:  Disposition Plan: To be determined.    Brief narrative: 79 year old female with history of chronic anemia, Vitamin B12 deficiency, hypertension, dyslipidemia, osteoporosis, hypothyroidism, Myasthenia Gravis, CHF (last 2 D ECHO on file in 2009 with EF 55 - 60%), PAF, TIA, cholelithiasis/Porclain gall bladder, presenting to [facility] ED after being sent by PCP for low potassium. For some time, patient has had progressive weakness/fatigue and poor oral intake which necessitated the visit to PCP. She had her blood work done there which was significant for potassium of 2.1. Patient has no other complaints. No chest pain, palpitations, shortness of breath, abdominal pain, nausea or vomiting. She does endorse chronic diarrhea. In ED, BP was 106/50, HR 74, Temp 98 F and O2 saturation 94% on room air. CBC revealed hemoglobin of 9.2 and BMP revealed potassium of 2.1 and creatinine of 1.4 (previous values were within normal limits). Admitted for further management.    Consultants:  N/A.   Procedures:  N/A.   Antibiotics:  N/A.   HPI/Subjective: No new issues.   Objective: Vital signs in last 24 hours: Temp:  [97.8 F (36.6 C)-99.4 F (37.4 C)] 98.2 F (36.8 C) (07/21 0636) Pulse Rate:  [61-82] 80 (07/21 0636) Resp:  [16-18] 18 (07/21 0636) BP: (111-153)/(58-83) 111/72 mmHg (07/21 0636) SpO2:  [100 %] 100 % (07/21 0636) Weight:  [89.8 kg (197 lb 15.6 oz)] 89.8 kg (197 lb 15.6 oz) (07/21 0700) Weight change: -0.2 kg (-7.1 oz) Last BM Date: 12/10/12  Intake/Output from previous day: 07/20  0701 - 07/21 0700 In: 1880 [P.O.:1480; I.V.:250; Blood:150] Out: 600 [Urine:600]     Physical Exam: General: Comfortable, alert, communicative, fully oriented, not short of breath  at rest.  HEENT:  Mild clinical pallor, no jaundice, no conjunctival injection or discharge. Hydration is fair.  NECK:  Supple, JVP not seen, no carotid bruits, no palpable lymphadenopathy, no palpable goiter. CHEST:  Clinically clear to auscultation, no wheezes, no crackles. HEART:  Sounds 1 and 2 heard, normal, regular, no murmurs. ABDOMEN:  Obese, has umbilical hernia and old laparotomy scar, soft, non-tender, no palpable organomegaly, no palpable masses, normal bowel sounds. GENITALIA:  Not examined. LOWER EXTREMITIES:  Mild pitting edema, palpable peripheral pulses. MUSCULOSKELETAL SYSTEM:  Generalized osteoarthritic changes, otherwise, normal. CENTRAL NERVOUS SYSTEM:  No focal neurologic deficit on gross examination.  Lab Results:  Recent Labs  12/09/12 0435 12/10/12 0445  WBC 4.3 3.9*  HGB 7.9* 9.1*  HCT 24.5* 27.8*  PLT 203 175    Recent Labs  12/09/12 0435 12/09/12 1745 12/10/12 0445  NA 140  --  141  K 2.8* 3.4* 2.9*  CL 107  --  108  CO2 27  --  27  GLUCOSE 110*  --  113*  BUN 10  --  8  CREATININE 0.87  --  0.81  CALCIUM 7.7*  --  8.1*   Recent Results (from the past 240 hour(s))  CLOSTRIDIUM DIFFICILE BY PCR     Status: None   Collection Time    12/08/12  4:09 PM      Result Value Range Status   C difficile by pcr NEGATIVE  NEGATIVE Final     Studies/Results: No results found.  Medications: Scheduled Meds: . atorvastatin  20 mg Oral q1800  . buPROPion  150 mg Oral Daily  . clopidogrel  75 mg Oral Q breakfast  . famotidine  20 mg Oral Daily  . feeding supplement  237 mL Oral Daily  . feeding supplement  1 Container Oral BID BM  . levothyroxine  125 mcg Oral Q breakfast  . mycophenolate  1,000 mg Oral BID  . omega-3 acid ethyl esters  1 g Oral Daily  . potassium chloride  40 mEq Oral Once  . sodium chloride  3 mL Intravenous Q12H  . vitamin B-12  1,000 mcg Oral Daily  . vitamin C  250 mg Oral Daily   Continuous Infusions:   PRN  Meds:.acetaminophen, acetaminophen, ALPRAZolam, HYDROcodone-acetaminophen, morphine injection, ondansetron (ZOFRAN) IV, ondansetron    LOS: 3 days   Cayne Yom,CHRISTOPHER  [facility] Pager [phone]. If 8PM-8AM, please contact night-coverage at [email or web address], password [facility] 12/10/2012, 10:45 AM  LOS: 3 days

## 2012-12-11 ENCOUNTER — Encounter (HOSPITAL_COMMUNITY): Payer: Self-pay | Admitting: Internal Medicine

## 2012-12-11 LAB — BASIC METABOLIC PANEL
CO2: 25 mEq/L (ref 19–32)
Glucose, Bld: 95 mg/dL (ref 70–99)
Potassium: 3.3 mEq/L — ABNORMAL LOW (ref 3.5–5.1)
Sodium: 141 mEq/L (ref 135–145)

## 2012-12-11 LAB — GLUCOSE, CAPILLARY

## 2012-12-11 LAB — CBC
Hemoglobin: 9.3 g/dL — ABNORMAL LOW (ref 12.0–15.0)
MCH: 27.6 pg (ref 26.0–34.0)
RBC: 3.37 MIL/uL — ABNORMAL LOW (ref 3.87–5.11)

## 2012-12-11 MED ORDER — LOPERAMIDE HCL 2 MG PO CAPS
2.0000 mg | ORAL_CAPSULE | Freq: Three times a day (TID) | ORAL | Status: DC
  Administered 2012-12-11 – 2012-12-12 (×2): 2 mg via ORAL
  Filled 2012-12-11 (×5): qty 1

## 2012-12-11 MED ORDER — LOPERAMIDE HCL 2 MG PO CAPS
2.0000 mg | ORAL_CAPSULE | ORAL | Status: DC | PRN
  Administered 2012-12-11: 2 mg via ORAL
  Filled 2012-12-11: qty 1

## 2012-12-11 MED ORDER — IRBESARTAN 300 MG PO TABS
300.0000 mg | ORAL_TABLET | Freq: Every day | ORAL | Status: DC
  Administered 2012-12-11 – 2012-12-12 (×2): 300 mg via ORAL
  Filled 2012-12-11 (×2): qty 1

## 2012-12-11 MED FILL — Diphenhydramine HCl Inj 50 MG/ML: INTRAMUSCULAR | Qty: 1 | Status: AC

## 2012-12-11 NOTE — Progress Notes (Signed)
Performed yesterday 

## 2012-12-11 NOTE — Progress Notes (Signed)
[facility] Progress Note  Subjective:  Feels ok.  Eating breakfast, but says that she knows she will have diarrhea.  Was taking Imodium at home, which helped.  Objective:  Vital signs in last 24 hours: Temp:  [97 F (36.1 C)-98.9 F (37.2 C)] 98.5 F (36.9 C) (07/22 0618) Pulse Rate:  [60-84] 70 (07/22 0618) Resp:  [10-52] 16 (07/22 0618) BP: (115-177)/(56-86) 151/56 mmHg (07/22 0618) SpO2:  [96 %-100 %] 99 % (07/22 0618) Last BM Date: 12/11/12 General:   Alert, Well-developed, in NAD Heart:  Regular rate and rhythm; no murmurs Pulm:  CTAB.  No W/R/R. Abdomen:  Soft, nontender and nondistended. Normal bowel sounds, without guarding, and without rebound.   Extremities:  Without edema. Neurologic:  Alert and  oriented x4;  grossly normal neurologically. Psych:  Alert and cooperative. Normal mood and affect.  Lab Results:  Recent Labs  12/09/12 0435 12/10/12 0445 12/11/12 0420  WBC 4.3 3.9* 5.3  HGB 7.9* 9.1* 9.3*  HCT 24.5* 27.8* 29.0*  PLT 203 175 199   BMET  Recent Labs  12/09/12 0435  12/10/12 0445 12/10/12 1234 12/11/12 0420  NA 140  --  141  --  141  K 2.8*  < > 2.9* 2.8* 3.3*  CL 107  --  108  --  109  CO2 27  --  27  --  25  GLUCOSE 110*  --  113*  --  95  BUN 10  --  8  --  7  CREATININE 0.87  --  0.81  --  0.76  CALCIUM 7.7*  --  8.1*  --  8.2*  < > = values in this interval not displayed. LFT  Recent Labs  12/09/12 0435  PROT 4.3*  ALBUMIN 2.0*  AST 15  ALT 10  ALKPHOS 66  BILITOT 0.5   Assessment / Plan: 46. 79 year old female with bowel changes (loose stools) and documented 20 pound weight loss since Oct 2013. On cellcept which can cause diarrhea but she has been on cellcept for years.  C-diff is negative. Pathogen panel negative as well. Colonoscopy revealed colitis with superficial ulcers throughout the colon and biopsies were obtained.  Await pathology results.  In the meantime she would like some Imodium.  2. Anemia. Iron  studies don't suggest iron deficiency. No overt GI blood loss but heme +. Patient is on plavix. S/p 2 units of PRBC's since admission.  3 .Chronic anti-platlelet therapy for what sounds like a TIA.  4. Longstanding myasthenia gravis  5. Hypokalemia: Likely secondary to GI losses.    LOS: 4 days   ZEHR, JESSICA D.  12/11/2012, 8:54 AM  Pager number [phone]   [facility] Attending  I have also seen and assessed the patient and agree with the above note. Loperamide is helping some. Hope to have pathology results tomorrow - need to call if not out - and will then have more specific treatment ideas.  Iva Boop, MD, [facility] [phone] (pager) 12/11/2012 7:35 PM

## 2012-12-11 NOTE — Progress Notes (Signed)
Physical Therapy Treatment Patient Details Name: [name] MRN: [id number] DOB: May 02, 1925 Today's Date: 12/11/2012 Time: 8295-6213 PT Time Calculation (min): 25 min  PT Assessment / Plan / Recommendation  History of Present Illness Pt presents with hypokalemia and diarrhea with history of Myasthenia Gravis.  Overall min assist to min/guard on day of eval with use of RW.  Uses rollator at home (may need to assess safety with use of rollator).  At home with son and daughter in law.  Son is there 24/7.     Clinical Impression    PT Comments   Pt c/o max fatigue/weakness with days of diarrhea.  Assisted OOB to amb in hallway then assisted to BR before assisting back to bed.   Follow Up Recommendations        Does the patient have the potential to tolerate intense rehabilitation     Barriers to Discharge        Equipment Recommendations       Recommendations for Other Services    Frequency     Progress towards PT Goals Progress towards PT goals: Progressing toward goals  Plan      Precautions / Restrictions Precautions Precautions: Fall    Pertinent Vitals/Pain No c/o pain    Mobility  Bed Mobility Bed Mobility: Supine to Sit;Sit to Supine Supine to Sit: 6: Modified independent (Device/Increase time) Sit to Supine: 6: Modified independent (Device/Increase time) Details for Bed Mobility Assistance: increased time and use of handrail.  Transfers Transfers: Sit to Stand;Stand to Sit Sit to Stand: 4: Min guard;5: Supervision;From toilet;From bed Stand to Sit: 5: Supervision;4: Min guard;To toilet;To bed Details for Transfer Assistance: increased time and one VC for safety with turns Ambulation/Gait Ambulation/Gait Assistance: 5: Supervision;4: Min guard Ambulation Distance (Feet): 12 Feet Assistive device: Rolling walker Ambulation/Gait Assistance Details: <25% VC's on proper walker to self distance and increased time due to c/o fatigue/weakness Gait Pattern:  Step-through pattern;Decreased stride length Gait velocity: decreased     PT Goals (current goals can now be found in the care plan section)    Visit Information  Last PT Received On: 12/11/12 Assistance Needed: +1 History of Present Illness: Pt presents with hypokalemia and diarrhea with history of Myasthenia Gravis.  Overall min assist to min/guard on day of eval with use of RW.  Uses rollator at home (may need to assess safety with use of rollator).  At home with son and daughter in law.  Son is there 24/7.      Subjective Data      Cognition       Balance     End of Session     Felecia Shelling  PTA [facility]  Acute  Rehab Pager      [phone]

## 2012-12-11 NOTE — Progress Notes (Signed)
[facility] PROGRESS NOTE  Abagayle Klutts ZOX:[id number] DOB: 1925/01/04 DOA: 12/07/2012 PCP: Nelwyn Salisbury, MD  Assessment/Plan: Principal Problem:   Hypokalemia Active Problems:   HYPOTHYROIDISM   HYPERLIPIDEMIA   ANEMIA-NOS   ANXIETY   Myasthenia gravis without exacerbation   HYPERTENSION   Acute renal failure   Diarrhea   Malnutrition of moderate degree   Ulceration, colon    1. Hypokalemia: Patient presented with progressive fatigue/weakness, and was found to have a profound hypokalemia, with potassium of 2.1, in the setting of chronic diarrhea, dehydration and continued HCTZ therapy. Fortunately, magnesium is normal. Repleting as indicated, and monitoring telemetrically. QTc prolongation noted. TSH is normal at 1.41. Repeat EKG is pending.  2. Diarrhea: Per patient, she has had frequent loose stools for about 2.5 months, symptomatically addressed with Lomotil. Still an ongoing problem. This is contributory to her electrolyte abnormality. PCR C. Difficile and other stool pathogens are negative. Dr Melvia Heaps provided GI consultation, and Dr Stan Head performed colonoscopy on 12/10/12, which showed colitis with superficial ulcers was found  throughout the entire examined colon; multiple biopsies were obtained. Pathology is pending. Managing per GI.  3. Acute renal failure: Creatinine at presentation, was 1.4, against a known baseline creatinine of 1.2 on 03/07/2012. This is likely AKI, secondary to dehydration. Managing with iv fluids, and as of 12/09/12, creatinine was 0.87. Resolved.  4. HTN: BP appeared reasonable initially, and Irbesartan-Hctz were held, due to AKI. BP has now crept up, and as AKI has resolved, we have restarted Irbesartan today.  5. Anemia: Patient has a normocytic anemia, and known Vitamin B12 deficiency.  Baseline HB was 10.1 on 03/07/2012. At presentation, HB was 9.2, but following hydration, dropped to 7.2-7.3 in AM of 12/08/12. Anemia panel revealed  iron deficiency and FOBT was positive. Patient states that her last colonoscopy was in 1999. Transfused a total of 2 units PRBC with satisfactory bump in HB to 9.1. Fortunately, no sinister lesions were found on colonoscopy. Reassured accordingly.  6. Hypothyroidism: On thyroxine replacement therapy. TSH is normal at 1.41.  7. Hyperlipidemia: On Fish oil and Statin. 8. Anxiety: Stable on Xanax. 9. Myasthenia gravis: No clinical exacerbation. Continued on Cellcept   Code Status: Full Code.  Family Communication:  Disposition Plan: To be determined.    Brief narrative: 79 year old female with history of chronic anemia, Vitamin B12 deficiency, hypertension, dyslipidemia, osteoporosis, hypothyroidism, Myasthenia Gravis, CHF (last 2 D ECHO on file in 2009 with EF 55 - 60%), PAF, TIA, cholelithiasis/Porclain gall bladder, presenting to [facility] ED after being sent by PCP for low potassium. For some time, patient has had progressive weakness/fatigue and poor oral intake which necessitated the visit to PCP. She had her blood work done there which was significant for potassium of 2.1. Patient has no other complaints. No chest pain, palpitations, shortness of breath, abdominal pain, nausea or vomiting. She does endorse chronic diarrhea. In ED, BP was 106/50, HR 74, Temp 98 F and O2 saturation 94% on room air. CBC revealed hemoglobin of 9.2 and BMP revealed potassium of 2.1 and creatinine of 1.4 (previous values were within normal limits). Admitted for further management.    Consultants:  N/A.   Procedures:  N/A.   Antibiotics:  N/A.   HPI/Subjective: Still has diarrhea, albeit, less. .   Objective: Vital signs in last 24 hours: Temp:  [97 F (36.1 C)-98.9 F (37.2 C)] 98.5 F (36.9 C) (07/22 0618) Pulse Rate:  [60-84] 70 (07/22 0618) Resp:  [10-52] 16 (  07/22 0618) BP: (115-177)/(56-86) 151/56 mmHg (07/22 0618) SpO2:  [96 %-100 %] 99 % (07/22 0618) Weight change:  Last BM Date:  12/11/12  Intake/Output from previous day:       Physical Exam: General: Comfortable, alert, communicative, fully oriented, not short of breath at rest.  HEENT:  Mild clinical pallor, no jaundice, no conjunctival injection or discharge. Hydration is satisfactory.  NECK:  Supple, JVP not seen, no carotid bruits, no palpable lymphadenopathy, no palpable goiter. CHEST:  Clinically clear to auscultation, no wheezes, no crackles. HEART:  Sounds 1 and 2 heard, normal, regular, no murmurs. ABDOMEN:  Obese, has umbilical hernia and old laparotomy scar, soft, non-tender, no palpable organomegaly, no palpable masses, normal bowel sounds. GENITALIA:  Not examined. LOWER EXTREMITIES:  Mild pitting edema, palpable peripheral pulses. MUSCULOSKELETAL SYSTEM:  Generalized osteoarthritic changes, otherwise, normal. CENTRAL NERVOUS SYSTEM:  No focal neurologic deficit on gross examination.  Lab Results:  Recent Labs  12/10/12 0445 12/11/12 0420  WBC 3.9* 5.3  HGB 9.1* 9.3*  HCT 27.8* 29.0*  PLT 175 199    Recent Labs  12/10/12 0445 12/10/12 1234 12/11/12 0420  NA 141  --  141  K 2.9* 2.8* 3.3*  CL 108  --  109  CO2 27  --  25  GLUCOSE 113*  --  95  BUN 8  --  7  CREATININE 0.81  --  0.76  CALCIUM 8.1*  --  8.2*   Recent Results (from the past 240 hour(s))  CLOSTRIDIUM DIFFICILE BY PCR     Status: None   Collection Time    12/08/12  4:09 PM      Result Value Range Status   C difficile by pcr NEGATIVE  NEGATIVE Final     Studies/Results: No results found.  Medications: Scheduled Meds: . atorvastatin  20 mg Oral q1800  . buPROPion  150 mg Oral Daily  . clopidogrel  75 mg Oral Q breakfast  . famotidine  20 mg Oral Daily  . feeding supplement  237 mL Oral Daily  . feeding supplement  1 Container Oral BID BM  . irbesartan  300 mg Oral Daily  . levothyroxine  125 mcg Oral Q breakfast  . mycophenolate  1,000 mg Oral BID  . omega-3 acid ethyl esters  1 g Oral Daily  .  potassium chloride  40 mEq Oral TID  . sodium chloride  3 mL Intravenous Q12H  . vitamin B-12  1,000 mcg Oral Daily  . vitamin C  250 mg Oral Daily   Continuous Infusions:   PRN Meds:.acetaminophen, acetaminophen, ALPRAZolam, HYDROcodone-acetaminophen, loperamide, morphine injection, ondansetron (ZOFRAN) IV, ondansetron    LOS: 4 days   Shevonne Wolf,CHRISTOPHER  [facility] Pager [phone]. If 8PM-8AM, please contact night-coverage at [email or web address], password [email or web address] 12/11/2012, 11:13 AM  LOS: 4 days

## 2012-12-12 LAB — BASIC METABOLIC PANEL
CO2: 25 mEq/L (ref 19–32)
Glucose, Bld: 102 mg/dL — ABNORMAL HIGH (ref 70–99)
Potassium: 3.8 mEq/L (ref 3.5–5.1)
Sodium: 136 mEq/L (ref 135–145)

## 2012-12-12 LAB — CBC
Hemoglobin: 8.9 g/dL — ABNORMAL LOW (ref 12.0–15.0)
RBC: 3.21 MIL/uL — ABNORMAL LOW (ref 3.87–5.11)
WBC: 4.6 10*3/uL (ref 4.0–10.5)

## 2012-12-12 MED ORDER — IRBESARTAN 300 MG PO TABS: 300.0000 mg | ORAL_TABLET | Freq: Every day | ORAL | Status: DC

## 2012-12-12 MED ORDER — ENSURE COMPLETE PO LIQD: 237.0000 mL | Freq: Every day | ORAL | Status: AC

## 2012-12-12 MED ORDER — BOOST / RESOURCE BREEZE PO LIQD: 1.0000 | Freq: Two times a day (BID) | ORAL | Status: DC

## 2012-12-12 MED ORDER — PREDNISONE 10 MG PO TABS: ORAL_TABLET | ORAL | Status: DC

## 2012-12-12 MED ORDER — LOPERAMIDE HCL 2 MG PO CAPS: 2.0000 mg | ORAL_CAPSULE | Freq: Three times a day (TID) | ORAL | Status: DC

## 2012-12-12 NOTE — Progress Notes (Signed)
Nutrition Education Note  RD consulted for nutrition education regarding a Low Fiber Diet  RD provided "Fiber-Restricted Nutrition Therapy" handout from the Academy of [employer]. Reviewed patient's dietary recall. Provided list of recommended foods to avoid. Discussed the different food groups and recommended low fiber choices for each food group. Teach back method used.  Expect good compliance.  Current diet order is Low Fiber, patient is consuming approximately 50-100% of meals at this time. Pt reports that diarrhea is better and her appetite has improved. Pt ate 100% of breakfast today. Pt has no further questions or concerns at this time. RD contact information provided  Ian Malkin RD, LDN Inpatient Clinical Dietitian Pager: [phone] After Hours Pager: [phone]

## 2012-12-12 NOTE — Discharge Summary (Signed)
Physician Discharge Summary  [name] YQM:[id number] DOB: 06-13-1924 DOA: 12/07/2012  PCP: Nelwyn Salisbury, MD  Admit date: 12/07/2012 Discharge date: 12/12/2012  Time spent: 40 minutes  Recommendations for Outpatient Follow-up:  1. Follow up with primary MD.  2. Follow up with Dr Melvia Heaps, Gastroenterologist.   Discharge Diagnoses:  Principal Problem:   Hypokalemia Active Problems:   HYPOTHYROIDISM   HYPERLIPIDEMIA   ANEMIA-NOS   ANXIETY   Myasthenia gravis without exacerbation   HYPERTENSION   Acute renal failure   Diarrhea   Malnutrition of moderate degree   Ulceration, colon   Discharge Condition: Satisfactory.  Diet recommendation: Heart-Healthy/Low Fiber Diet. Ceasar Mons Weights   12/09/12 0511 12/10/12 0700 12/12/12 0500  Weight: 90 kg (198 lb 6.6 oz) 89.8 kg (197 lb 15.6 oz) 89.2 kg (196 lb 10.4 oz)    History of present illness:  79 year old female with history of chronic anemia, Vitamin B12 deficiency, hypertension, dyslipidemia, osteoporosis, hypothyroidism, Myasthenia Gravis, CHF (last 2 D ECHO on file in 2009 with EF 55 - 60%), PAF, TIA, cholelithiasis/Porclain gall bladder, presenting to [facility] ED after being sent by PCP for low potassium. For some time, patient has had progressive weakness/fatigue and poor oral intake which necessitated the visit to PCP. She had her blood work done there which was significant for potassium of 2.1. Patient has no other complaints. No chest pain, palpitations, shortness of breath, abdominal pain, nausea or vomiting. She does endorse chronic diarrhea. In ED, BP was 106/50, HR 74, Temp 98 F and O2 saturation 94% on room air. CBC revealed hemoglobin of 9.2 and BMP revealed potassium of 2.1 and creatinine of 1.4 (previous values were within normal limits). Admitted for further management.    Hospital Course:  1. Hypokalemia: Patient presented with progressive fatigue/weakness, and was found to have a profound hypokalemia, with  potassium of 2.1, in the setting of chronic diarrhea, dehydration and continued HCTZ therapy. Fortunately, magnesium was normal. QTc prolongation noted. TSH is normal at 1.41. Repleted as indicated, with resolution. Fortunately, no arrhythmias were documented on telemetric monitoring, during this hospitalization.  2. Diarrhea: Per patient, she has had frequent loose stools for about 2.5 months, symptomatically addressed with Lomotil and remained persistent. This is contributory to her electrolyte abnormality. PCR C. Difficile and stool pathogens PCR panel were negative. Dr Melvia Heaps provided GI consultation, and Dr Stan Head performed colonoscopy on 12/10/12, which showed colitis with superficial ulcers was found  throughout the entire examined colon; multiple biopsies were obtained. Clinically, diarrhea has improved symptomatically. Per Dr Leone Payor, preliminary pathology report shows an acute colitis without chronic changes, so this does not sound like IBD. Etiology is uncertain, but recommended a tapering course of Prednisone. Patient will follow up with gastroenterologist after discharge.  3. Acute renal failure: Creatinine at presentation, was 1.4, against a known baseline creatinine of 1.2 on 03/07/2012. This is likely AKI, secondary to dehydration. Managed with iv fluids, and as of 12/09/12, creatinine was 0.87. Resolved.  4. HTN: BP appeared reasonable initially, and pre-admission Irbesartan-HCTZ was held, due to AKI. BP started creeping up as of 12/10/12, and as AKI had resolved, Irbesartan was reinstated on 12/11/12, without deleterious effect, and with satisfactory BP control.  5. Anemia: Patient has a normocytic anemia, and known Vitamin B12 deficiency. Baseline HB was 10.1 on 03/07/2012. At presentation, HB was 9.2, but following hydration, dropped to 7.2-7.3 in AM of 12/08/12. Anemia panel revealed iron deficiency and FOBT was positive. Patient states that  her last colonoscopy was in 1999.  Transfused a total of 2 units PRBC with satisfactory bump in HB to 9.1. HB has since remained stable/satisfactory. Fortunately, no sinister lesions were found on colonoscopy. Reassured accordingly.  6. Hypothyroidism: On thyroxine replacement therapy. TSH is normal at 1.41.  7. Hyperlipidemia: On Fish oil and Statin.  8. Anxiety: Stable on Xanax.  9. Myasthenia gravis: No clinical exacerbation. Continued on Cellcept    Procedures:  See Below.  Colonoscopy on 12/10/12.   Consultations:  Dr Melvia Heaps, GI.  Dr Stan Head, GI.   Discharge Exam: Filed Vitals:   12/11/12 2143 12/12/12 0500 12/12/12 0540 12/12/12 1300  BP: 132/61  111/47 121/55  Pulse: 66  70 65  Temp: 98.5 F (36.9 C)  98.8 F (37.1 C) 98.5 F (36.9 C)  TempSrc: Oral  Oral Oral  Resp: 18  16 15   Height:      Weight:  89.2 kg (196 lb 10.4 oz)    SpO2: 97%  96% 97%    General: Comfortable, alert, communicative, fully oriented, not short of breath at rest.  HEENT: Mild clinical pallor, no jaundice, no conjunctival injection or discharge. Hydration is satisfactory.  NECK: Supple, JVP not seen, no carotid bruits, no palpable lymphadenopathy, no palpable goiter.  CHEST: Clinically clear to auscultation, no wheezes, no crackles.  HEART: Sounds 1 and 2 heard, normal, regular, no murmurs.  ABDOMEN: Obese, has umbilical hernia and old laparotomy scar, soft, non-tender, no palpable organomegaly, no palpable masses, normal bowel sounds.  GENITALIA: Not examined.  LOWER EXTREMITIES: Mild pitting edema, palpable peripheral pulses.  MUSCULOSKELETAL SYSTEM: Generalized osteoarthritic changes, otherwise, normal.  CENTRAL NERVOUS SYSTEM: No focal neurologic deficit on gross examination.  Discharge Instructions      Discharge Orders   Future Appointments Provider Department Dept Phone   10/16/2013 1:30 PM Nilda Riggs, NP [facility] [phone]   Future Orders Complete By Expires     Diet  - low sodium heart healthy  As directed     Increase activity slowly  As directed         Medication List    STOP taking these medications       irbesartan-hydrochlorothiazide 300-12.5 MG per tablet  Commonly known as:  AVALIDE      TAKE these medications       ALPRAZolam 1 MG tablet  Commonly known as:  XANAX  Take 1 tablet (1 mg total) by mouth 3 (three) times daily as needed for anxiety.     ALPRAZolam 0.25 MG tablet  Commonly known as:  XANAX  Take 1 tablet (0.25 mg total) by mouth 2 (two) times daily as needed for anxiety.     atorvastatin 20 MG tablet  Commonly known as:  LIPITOR  Take 20 mg by mouth daily.     buPROPion 150 MG 24 hr tablet  Commonly known as:  WELLBUTRIN XL  Take 1 tablet (150 mg total) by mouth every morning.     clopidogrel 75 MG tablet  Commonly known as:  PLAVIX  Take 1 tablet (75 mg total) by mouth daily.     co-enzyme Q-10 50 MG capsule  Take 50 mg by mouth daily.     feeding supplement Liqd  Take 237 mLs by mouth daily.     feeding supplement Liqd  Take 1 Container by mouth 2 (two) times daily between meals.     fish oil-omega-3 fatty acids 1000 MG capsule  Take 2 g by mouth  daily.     FLEX-A-MIN JOINT FLEX PO  Take by mouth.     glucosamine-chondroitin 500-400 MG tablet  Take 1 tablet by mouth 3 (three) times daily.     irbesartan 300 MG tablet  Commonly known as:  AVAPRO  Take 1 tablet (300 mg total) by mouth daily.     levothyroxine 125 MCG tablet  Commonly known as:  SYNTHROID, LEVOTHROID  Take 1 tablet (125 mcg total) by mouth daily.     loperamide 2 MG capsule  Commonly known as:  IMODIUM  Take 1 capsule (2 mg total) by mouth 3 (three) times daily.     mycophenolate 500 MG tablet  Commonly known as:  CELLCEPT  Take 1,000 mg by mouth 2 (two) times daily.     predniSONE 10 MG tablet  Commonly known as:  DELTASONE  40 mg daily x 3 days, then 30 mg daily x 3 days, then 20 mg daily x 6 days, then 10 mg daily x 10  days, then 5 mg daily x 10 days and then stop.     PROBIOTIC DAILY PO  Take by mouth.     ranitidine 150 MG tablet  Commonly known as:  ZANTAC  Take 150 mg by mouth daily.     vitamin B-12 1000 MCG tablet  Commonly known as:  CYANOCOBALAMIN  Take 1,000 mcg by mouth daily.     vitamin C 250 MG tablet  Commonly known as:  ASCORBIC ACID  Take 250 mg by mouth daily.       Allergies  Allergen Reactions  . Furosemide     REACTION: unspecified  . Sulfamethoxazole     REACTION: unspecified   Follow-up Information   Follow up with Melvia Heaps, MD. (Office will call and arrange appointment for early August (appointment will be with Ms. Wilmon Pali NP-C or Ms. Zehr PA-C)    Contact information:   [address] [place] [address] [phone]       Follow up with Nelwyn Salisbury, MD.   Contact information:   [address] [place] [address] [phone]        The results of significant diagnostics from this hospitalization (including imaging, microbiology, ancillary and laboratory) are listed below for reference.    Significant Diagnostic Studies: No results found.  Microbiology: Recent Results (from the past 240 hour(s))  CLOSTRIDIUM DIFFICILE BY PCR     Status: None   Collection Time    12/08/12  4:09 PM      Result Value Range Status   C difficile by pcr NEGATIVE  NEGATIVE Final     Labs: Basic Metabolic Panel:  Recent Labs Lab 12/07/12 1850 12/08/12 0026 12/08/12 0417  12/09/12 0435 12/09/12 1745 12/10/12 0445 12/10/12 1234 12/11/12 0420 12/12/12 0430  NA 136 139 141  --  140  --  141  --  141 136  K <2.0* 2.2* 2.2*  < > 2.8* 3.4* 2.9* 2.8* 3.3* 3.8  CL 95* 100 103  --  107  --  108  --  109 106  CO2 29 28 29   --  27  --  27  --  25 25  GLUCOSE 131* 98 107*  --  110*  --  113*  --  95 102*  BUN 24* 20 18  --  10  --  8  --  7 9  CREATININE 1.30* 1.20* 1.13*  --  0.87  --  0.81  --  0.76 0.81  CALCIUM 8.7 8.0* 7.8*  --  7.7*  --   8.1*  --  8.2* 8.3*  MG  --  1.8  --   --   --   --   --   --   --   --   PHOS  --  2.4  --   --   --   --   --   --   --   --   < > = values in this interval not displayed. Liver Function Tests:  Recent Labs Lab 12/07/12 1435 12/08/12 0026 12/08/12 0417 12/09/12 0435  AST 17 15 14 15   ALT 16 11 11 10   ALKPHOS 78 76 71 66  BILITOT 0.9 0.5 0.5 0.5  PROT 5.6* 4.9* 4.5* 4.3*  ALBUMIN 2.8* 2.2* 2.2* 2.0*    Recent Labs Lab 12/07/12 1850  LIPASE 9*   No results found for this basename: AMMONIA,  in the last 168 hours CBC:  Recent Labs Lab 12/07/12 1435  12/08/12 0026 12/08/12 0417 12/09/12 0435 12/10/12 0445 12/11/12 0420 12/12/12 0430  WBC 9.2  < > 4.7 4.5 4.3 3.9* 5.3 4.6  NEUTROABS 7.7  --  3.8  --   --   --   --   --   HGB 9.2*  < > 7.2* 7.3* 7.9* 9.1* 9.3* 8.9*  HCT 27.9*  < > 22.3* 22.6* 24.5* 27.8* 29.0* 27.6*  MCV 82.6  < > 82.0 82.5 84.5 84.5 86.1 86.0  PLT 292.0  < > 234 222 203 175 199 174  < > = values in this interval not displayed. Cardiac Enzymes:  Recent Labs Lab 12/08/12 0026 12/08/12 0417 12/08/12 0954  TROPONINI <0.30 <0.30 <0.30   BNP: BNP (last 3 results)  Recent Labs  12/08/12 0026  PROBNP 465.5*   CBG:  Recent Labs Lab 12/08/12 0740 12/09/12 0735 12/10/12 0733 12/11/12 0829 12/12/12 0737  GLUCAP 106* 101* 113* 93 93       Signed:  Twinkle Sockwell,CHRISTOPHER  [facility] 12/12/2012, 2:23 PM

## 2012-12-12 NOTE — Progress Notes (Addendum)
[facility] Progress Note  Subjective:  Feels good.  Imodium is helping diarrhea.    Objective:  Vital signs in last 24 hours: Temp:  [98.4 F (36.9 C)-98.8 F (37.1 C)] 98.8 F (37.1 C) (07/23 0540) Pulse Rate:  [66-78] 70 (07/23 0540) Resp:  [15-18] 16 (07/23 0540) BP: (111-132)/(47-62) 111/47 mmHg (07/23 0540) SpO2:  [96 %-97 %] 96 % (07/23 0540) Weight:  [196 lb 10.4 oz (89.2 kg)] 196 lb 10.4 oz (89.2 kg) (07/23 0500) Last BM Date: 12/11/12 General:   Alert, Well-developed, in NAD Heart:  Regular rate and rhythm; no murmurs Pulm:  CTAB.  No W/R/R. Abdomen:  Soft, nontender and nondistended. Normal bowel sounds, without guarding, and without rebound.   Extremities:  Without edema. Neurologic:  Alert and  oriented x4;  grossly normal neurologically. Psych:  Alert and cooperative. Normal mood and affect.  Intake/Output from previous day: 07/22 0701 - 07/23 0700 In: 2080 [P.O.:2080] Out: 1300 [Urine:1300]  Lab Results:  Recent Labs  12/10/12 0445 12/11/12 0420 12/12/12 0430  WBC 3.9* 5.3 4.6  HGB 9.1* 9.3* 8.9*  HCT 27.8* 29.0* 27.6*  PLT 175 199 174   BMET  Recent Labs  12/10/12 0445 12/10/12 1234 12/11/12 0420 12/12/12 0430  NA 141  --  141 136  K 2.9* 2.8* 3.3* 3.8  CL 108  --  109 106  CO2 27  --  25 25  GLUCOSE 113*  --  95 102*  BUN 8  --  7 9  CREATININE 0.81  --  0.76 0.81  CALCIUM 8.1*  --  8.2* 8.3*   Assessment / Plan: 22. 80 year old female with bowel changes (loose stools) and documented 20 pound weight loss since Oct 2013. On cellcept, which can cause diarrhea but she has been on cellcept for years. C-diff is negative. Pathogen panel negative as well. Colonoscopy revealed colitis with superficial ulcers throughout the colon and biopsies were obtained. Await pathology results; I called pathology this AM, and they said that results should be out later this afternoon or tomorrow AM.  In the meantime, continue Imodium.  2. Anemia. Iron  studies don't suggest iron deficiency. No overt GI blood loss but heme +. Patient is on plavix. S/p 2 units of PRBC's since admission.  Hgb fairly stable.  3 .Chronic anti-platlelet therapy for what sounds like a TIA.  4. Longstanding myasthenia gravis  5. Hypokalemia: Likely secondary to GI losses.  Now improved.    LOS: 5 days   ZEHR, JESSICA D.  12/12/2012, 9:02 AM  Pager number [phone]   [facility] Attending  I have also seen and agree with the above note. Waiting on pathology - we called hopefully out this PM.  Iva Boop, MD, [facility] [phone] (pager) 12/12/2012 12:31 PM  I have gotten an oral report on pathology that shows an acute colitis without chronic changes - so this does not sound like IBD. I think it is reasonable to treat with a short course of steroids to see if that helps however - as we are not clear on the cause.  So - would dc on prednisone 10 mg tablets and have her take:  1) 4 daily x 3 days 2) 3 daily x 3 days 3) 2 daily x 6 days 4) 1 daily x 10 days 5) 1/2 tab daily x 10 days and then stop  We will arrange follow-up in the office as well. We will calll (see follow-up in dc section)  Iva Boop, MD, [facility]: [facility] [phone] (pager) 12/12/2012 1:48 PM

## 2012-12-13 NOTE — Progress Notes (Signed)
Quick Note:  Reviewed at hospital Will try prednisone for this non-specific colitis and she will be seeing NP/PA in follow-up in early August No letter or recall ______

## 2012-12-14 ENCOUNTER — Telehealth: Payer: Self-pay | Admitting: *Deleted

## 2012-12-14 NOTE — Telephone Encounter (Signed)
transitional care:  Admit date:12/07/2012 discharge date:12/12/2012  Discharge diagnoses: Principal problem:  hypokalemia Active problem:  hypothyroidism  hyperlipidemia  anemia-nos  anxiety Myasthenia gravis without exacerbation  hypertension  acute renal failure  malnutrition of moderate degree Ulceration, colon  Discharge condition: satisfactory recommendation for outpatient follow-up: 1. Follow up with primary MD. 2.follow up dr Melvia Heaps,  Talked with patient and she c/o weakness but strength is slowly returning.  When admitted she has had frequent diarrhea and had colonoscopy while in hospital and was diagnosed  With acute colitis.  Was discharged with prednisone dose pack for this.  Patient states she is compliant with her medication since discharge.  Still has diarrhea,but not as frequent.  She was encouraged to drink plenty of liquid  to stay hydrated.  patient has appointment with dr fry on 8/1/12014.

## 2012-12-21 ENCOUNTER — Ambulatory Visit (INDEPENDENT_AMBULATORY_CARE_PROVIDER_SITE_OTHER): Payer: Medicare Other | Admitting: Family Medicine

## 2012-12-21 ENCOUNTER — Encounter: Payer: Self-pay | Admitting: Family Medicine

## 2012-12-21 VITALS — BP 140/78 | HR 71 | Temp 98.0°F | Wt 188.0 lb

## 2012-12-21 DIAGNOSIS — K5289 Other specified noninfective gastroenteritis and colitis: Secondary | ICD-10-CM

## 2012-12-21 DIAGNOSIS — I509 Heart failure, unspecified: Secondary | ICD-10-CM

## 2012-12-21 DIAGNOSIS — R197 Diarrhea, unspecified: Secondary | ICD-10-CM

## 2012-12-21 DIAGNOSIS — E876 Hypokalemia: Secondary | ICD-10-CM

## 2012-12-21 DIAGNOSIS — I10 Essential (primary) hypertension: Secondary | ICD-10-CM

## 2012-12-21 DIAGNOSIS — I82409 Acute embolism and thrombosis of unspecified deep veins of unspecified lower extremity: Secondary | ICD-10-CM

## 2012-12-21 DIAGNOSIS — K529 Noninfective gastroenteritis and colitis, unspecified: Secondary | ICD-10-CM

## 2012-12-21 DIAGNOSIS — I4891 Unspecified atrial fibrillation: Secondary | ICD-10-CM

## 2012-12-21 HISTORY — DX: Acute embolism and thrombosis of unspecified deep veins of unspecified lower extremity: I82.409

## 2012-12-21 LAB — CBC WITH DIFFERENTIAL/PLATELET
Basophils Relative: 0 % (ref 0.0–3.0)
Eosinophils Relative: 0.4 % (ref 0.0–5.0)
HCT: 33.9 % — ABNORMAL LOW (ref 36.0–46.0)
MCV: 85.3 fl (ref 78.0–100.0)
Monocytes Absolute: 0.4 10*3/uL (ref 0.1–1.0)
Monocytes Relative: 5 % (ref 3.0–12.0)
Neutrophils Relative %: 90.1 % — ABNORMAL HIGH (ref 43.0–77.0)
RBC: 3.98 Mil/uL (ref 3.87–5.11)
WBC: 7.2 10*3/uL (ref 4.5–10.5)

## 2012-12-21 LAB — BASIC METABOLIC PANEL
Chloride: 99 mEq/L (ref 96–112)
Creatinine, Ser: 1 mg/dL (ref 0.4–1.2)
Potassium: 3.4 mEq/L — ABNORMAL LOW (ref 3.5–5.1)

## 2012-12-21 MED ORDER — IRBESARTAN-HYDROCHLOROTHIAZIDE 300-12.5 MG PO TABS: 1.0000 | ORAL_TABLET | Freq: Every day | ORAL | Status: DC

## 2012-12-21 MED ORDER — POTASSIUM CHLORIDE ER 10 MEQ PO TBCR: 10.0000 meq | EXTENDED_RELEASE_TABLET | Freq: Two times a day (BID) | ORAL | Status: DC

## 2012-12-21 MED ORDER — GLUCOSAMINE-CHONDROITIN 500-400 MG PO TABS: 1.0000 | ORAL_TABLET | Freq: Two times a day (BID) | ORAL | Status: DC

## 2012-12-21 NOTE — Progress Notes (Signed)
  Subjective:    Patient ID: [name], female    DOB: 1924/07/05, 79 y.o.   MRN: [id number]  HPI Here to follow up after a hospital stay fromm 12-07-12 to 12-12-12 for diarrhea, weakness, and hypokalemia. Her admission labs showed a potassium of 2.1, a creatinine of 1.4, and a Hgb of 9.2. She was rehydrated with IV fluids and electrolytes, and her potassium normalized and her renal function improved. Her Hgb dropped to 7.1 however so she received 2 units of PRBC. This rose to 9.1 on DC. GI evaluated her diarrhea and she had a colonoscopy per Dr. Leone Payor which showed a non-specific colitis. All tests for GI infections including C diff were negative. She was started on a prednisone taper, and she felt much better. Since going home her appetite has improved, and her bowel patterns are closer to normal. Her stools are solid or mushy, not watery like before. She no longer has urgent BMs after eating like before. No fever. Her diueretic was removed, and 2 days after she got back home her ankels started to swell. She then stopped the Avapro she was given and went back to the Avalide she had been taking before. No SOB.    Review of Systems  Constitutional: Negative.   Respiratory: Negative.   Cardiovascular: Positive for leg swelling. Negative for chest pain and palpitations.  Gastrointestinal: Negative.        Objective:   Physical Exam  Constitutional: She appears well-developed and well-nourished.  She looks much better with better color. Walks with her walker   Cardiovascular: Normal rate, regular rhythm, normal heart sounds and intact distal pulses.   Pulmonary/Chest: Effort normal and breath sounds normal.  Abdominal: Soft. Bowel sounds are normal. She exhibits no distension and no mass. There is no tenderness. There is no rebound and no guarding.  Musculoskeletal:  2+ edema to the ankles and feet           Assessment & Plan:  Her colitis seems to be responding to the prednisone.  She is to see Dr. Arlyce Dice on 12-31-12. We will check a CBC and a BMET today. I agree with going back on Avalide but we will add Klor-con 10 mEq daily.

## 2012-12-25 MED ORDER — POTASSIUM CHLORIDE ER 10 MEQ PO TBCR: 20.0000 meq | EXTENDED_RELEASE_TABLET | Freq: Two times a day (BID) | ORAL | Status: DC

## 2012-12-25 NOTE — Progress Notes (Signed)
Quick Note:  I spoke with pt and sent script e-scribe. ______ 

## 2012-12-25 NOTE — Addendum Note (Signed)
Addended by: Aniceto Boss A on: 12/25/2012 03:11 PM   Modules accepted: Orders

## 2012-12-31 ENCOUNTER — Encounter: Payer: Self-pay | Admitting: Gastroenterology

## 2012-12-31 ENCOUNTER — Other Ambulatory Visit (INDEPENDENT_AMBULATORY_CARE_PROVIDER_SITE_OTHER): Payer: Medicare Other

## 2012-12-31 ENCOUNTER — Ambulatory Visit (INDEPENDENT_AMBULATORY_CARE_PROVIDER_SITE_OTHER): Payer: Medicare Other | Admitting: Gastroenterology

## 2012-12-31 VITALS — BP 104/60 | HR 100 | Ht 65.5 in | Wt 184.6 lb

## 2012-12-31 DIAGNOSIS — K5289 Other specified noninfective gastroenteritis and colitis: Secondary | ICD-10-CM

## 2012-12-31 DIAGNOSIS — K529 Noninfective gastroenteritis and colitis, unspecified: Secondary | ICD-10-CM

## 2012-12-31 DIAGNOSIS — K633 Ulcer of intestine: Secondary | ICD-10-CM

## 2012-12-31 MED ORDER — MESALAMINE 1.2 G PO TBEC: 4.8000 g | DELAYED_RELEASE_TABLET | Freq: Every day | ORAL | Status: DC

## 2012-12-31 NOTE — Patient Instructions (Addendum)
Go to the basement for labs today  Follow up in 3 weeks

## 2012-12-31 NOTE — Progress Notes (Signed)
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  Multple colonic ulcers were seen.  She was discharged on a tapering course of prednisone.  With prednisone urgency has decreased.  The diarrhea remains.  She has 3-4 very loose stools daily.  She denies rectal bleeding or hematochezia.  She is without abdominal pain.    Past Medical History  Diagnosis Date  . Anemia   . Hyperlipemia   . Hypertension   . Thyroid disease     hypothyroidism  . Anxiety   . Paroxysmal atrial fibrillation   . Vitamin B 12 deficiency   . CHF (congestive heart failure)   . TIA (transient ischemic attack)     4-09  . Microscopic hematuria     benign microscopic hematuria, worked up b Dr Alexis Frock  . Gall stone     porcelain gall bladder with single stone  per CT 02-2009 Dr Birdie Sons  . Osteoporosis     DEXA on 04-30-10  . Myasthenia gravis     sees Dr. Terrace Arabia   . Ulcerative colitis    Past Surgical History  Procedure Laterality Date  . Cardiac catheterization      04/1999  . Abdominal hysterectomy      with oophorectomy  . Colonoscopy  1999    normal   . Colonoscopy N/A 12/10/2012    Procedure: COLONOSCOPY;  Surgeon: Iva Boop, MD;  Location: [facility];  Service: Endoscopy;  Laterality: N/A;   family history includes Heart disease in her father. Current Outpatient Prescriptions  Medication Sig Dispense Refill  . ALPRAZolam (XANAX) 0.25 MG tablet Take 1 tablet (0.25 mg total) by mouth 2 (two) times daily as needed for anxiety.  60 tablet  5  . ALPRAZolam (XANAX) 1 MG tablet Take 1 tablet (1 mg total) by mouth 3 (three) times daily as needed for anxiety.  90 tablet  5  . atorvastatin (LIPITOR) 20 MG tablet Take 20 mg by mouth daily.       Marland Kitchen buPROPion (WELLBUTRIN XL) 150 MG 24 hr tablet Take 1 tablet (150 mg total) by mouth every morning.  30 tablet  11  . clopidogrel  (PLAVIX) 75 MG tablet Take 1 tablet (75 mg total) by mouth daily.  90 tablet  3  . co-enzyme Q-10 50 MG capsule Take 50 mg by mouth daily.      . feeding supplement (ENSURE COMPLETE) LIQD Take 237 mLs by mouth daily.  30 Bottle  0  . feeding supplement (RESOURCE BREEZE) LIQD Take 1 Container by mouth 2 (two) times daily between meals.  60 Container  0  . fish oil-omega-3 fatty acids 1000 MG capsule Take 2 g by mouth daily.      Marland Kitchen glucosamine-chondroitin 500-400 MG tablet Take 1 tablet by mouth 2 (two) times daily.  60 tablet  0  . irbesartan-hydrochlorothiazide (AVALIDE) 300-12.5 MG per tablet Take 1 tablet by mouth daily.  30 tablet  0  . levothyroxine (SYNTHROID, LEVOTHROID) 125 MCG tablet Take 1 tablet (125 mcg total) by mouth daily.  30 tablet  11  . loperamide (IMODIUM) 2 MG capsule Take 1 capsule (2 mg total) by mouth 3 (three) times daily.  90 capsule  0  . mycophenolate (CELLCEPT) 500 MG tablet Take 1,000 mg by mouth 2 (two) times daily.       . potassium chloride (K-DUR) 10 MEQ tablet Take 20 mEq  by mouth 4 (four) times daily.      . predniSONE (DELTASONE) 10 MG tablet 40 mg daily x 3 days, then 30 mg daily x 3 days, then 20 mg daily x 6 days, then 10 mg daily x 10 days, then 5 mg daily x 10 days and then stop.  48 tablet  0  . Probiotic Product (PROBIOTIC DAILY PO) Take by mouth.      . ranitidine (ZANTAC) 150 MG tablet Take 150 mg by mouth daily.      . vitamin B-12 (CYANOCOBALAMIN) 1000 MCG tablet Take 1,000 mcg by mouth daily.      . vitamin C (ASCORBIC ACID) 250 MG tablet Take 250 mg by mouth daily.       Current Facility-Administered Medications  Medication Dose Route Frequency Provider Last Rate Last Dose  . cyanocobalamin injection 1,000 mcg  1,000 mcg Intramuscular Q30 days Nelwyn Salisbury, MD   1,000 mcg at 08/01/11 1210   Allergies as of 12/31/2012 - Review Complete 12/31/2012  Allergen Reaction Noted  . Furosemide  11/10/2006  . Sulfamethoxazole  11/10/2006  . Ivp dye  (iodinated diagnostic agents) Rash 12/31/2012    reports that she has never smoked. She has never used smokeless tobacco. She reports that she does not drink alcohol or use illicit drugs.     Review of Systems: Pertinent positive and negative review of systems were noted in the above HPI section. All other review of systems were otherwise negative.  Vital signs were reviewed in today's medical record Physical Exam: General: Well developed , well nourished, no acute distress

## 2012-12-31 NOTE — Assessment & Plan Note (Addendum)
Patient has a nonspecific colitis by pathology.  She's had a less than robust response to prednisone.  Infectious etiology is unlikely in view of the length of her illness and negative stool pathogen panel.  She could have small bowel disease, but this has not been specifically pursued at this point.  Recommendations. #1 begin lialda 4.8 g daily. #2 check CRP, IBD serologies

## 2013-01-01 LAB — IBD EXPANDED PANEL
ACCA: 6 units (ref 0–90)
AMCA: 20 units (ref 0–100)
Atypical pANCA: NEGATIVE

## 2013-01-08 ENCOUNTER — Telehealth: Payer: Self-pay | Admitting: *Deleted

## 2013-01-08 DIAGNOSIS — K633 Ulcer of intestine: Secondary | ICD-10-CM

## 2013-01-08 MED ORDER — MESALAMINE 1.2 G PO TBEC: 4.8000 g | DELAYED_RELEASE_TABLET | Freq: Every day | ORAL | Status: DC

## 2013-01-08 NOTE — Telephone Encounter (Signed)
Resent in medication for correct quanitiy

## 2013-01-09 ENCOUNTER — Telehealth: Payer: Self-pay | Admitting: Gastroenterology

## 2013-01-09 NOTE — Telephone Encounter (Signed)
Pts daughter states the pt has been having nausea, no energy ever since she started taking the Lialda. Pt is also tapering off of prednisone. Discussed with daughter that sometimes coming off of the prednisone and make you feel tired and have no energy. Pt scheduled to see Doug Sou PA 01/11/13@ 2:30pm. Daughter aware of appt.

## 2013-01-11 ENCOUNTER — Other Ambulatory Visit (INDEPENDENT_AMBULATORY_CARE_PROVIDER_SITE_OTHER): Payer: Medicare Other

## 2013-01-11 ENCOUNTER — Ambulatory Visit (INDEPENDENT_AMBULATORY_CARE_PROVIDER_SITE_OTHER): Payer: Medicare Other | Admitting: Gastroenterology

## 2013-01-11 ENCOUNTER — Encounter: Payer: Self-pay | Admitting: Gastroenterology

## 2013-01-11 VITALS — BP 100/50 | HR 105 | Ht 65.0 in | Wt 175.5 lb

## 2013-01-11 DIAGNOSIS — Z8719 Personal history of other diseases of the digestive system: Secondary | ICD-10-CM

## 2013-01-11 DIAGNOSIS — R630 Anorexia: Secondary | ICD-10-CM

## 2013-01-11 DIAGNOSIS — R197 Diarrhea, unspecified: Secondary | ICD-10-CM

## 2013-01-11 DIAGNOSIS — R110 Nausea: Secondary | ICD-10-CM

## 2013-01-11 LAB — BASIC METABOLIC PANEL
BUN: 30 mg/dL — ABNORMAL HIGH (ref 6–23)
Calcium: 9.3 mg/dL (ref 8.4–10.5)
Creatinine, Ser: 1.6 mg/dL — ABNORMAL HIGH (ref 0.4–1.2)
GFR: 33.4 mL/min — ABNORMAL LOW (ref >60.00)
Potassium: 4.6 mEq/L (ref 3.5–5.1)

## 2013-01-11 MED ORDER — PREDNISONE 20 MG PO TABS: ORAL_TABLET | ORAL | Status: DC

## 2013-01-11 MED ORDER — VANCOMYCIN HCL 125 MG PO CAPS: ORAL_CAPSULE | ORAL | Status: DC

## 2013-01-11 NOTE — Patient Instructions (Addendum)
Please go to the basement level to have your labs drawn.  We scheduled the CT scan for Tues 01-15-2013. You will have to take Prednisone 50 mg 13 hours ahead of the scheduled time. (11 am). Then 7 hours ahead of time, then 1 hour ahead of time. Also take 1 tab, of benedryl 50 mg an hour ahead of scheduled time. (11 am ).    You have been scheduled for a CT scan of the abdomen and pelvis at [facility] ([address]---this is in the same building as [facility]).   You are scheduled on Tues 01-15-2013 at 11 am. You should arrive at 10:45 am prior to your appointment time for registration. Please follow the written instructions below on the day of your exam:  WARNING: IF YOU ARE ALLERGIC TO IODINE/X-RAY DYE, PLEASE NOTIFY RADIOLOGY IMMEDIATELY AT [phone]! YOU WILL BE GIVEN A 13 HOUR PREMEDICATION PREP.  1) Do not eat or drink anything after 7:00 am  (4 hours prior to your test) 2) You have been given 2 bottles of oral contrast to drink. The solution may taste  better if refrigerated, but do NOT add ice or any other liquid to this solution. Shake well before drinking.    Drink 1 bottle of contrast @ 9:00 am  (2 hours prior to your exam)  Drink 1 bottle of contrast @ 10:00 am  (1 hour prior to your exam)  You may take any medications as prescribed with a small amount of water except for the following: Metformin, Glucophage, Glucovance, Avandamet, Riomet, Fortamet, Actoplus Met, Janumet, Glumetza or Metaglip. The above medications must be held the day of the exam AND 48 hours after the exam.  The purpose of you drinking the oral contrast is to aid in the visualization of your intestinal tract. The contrast solution may cause some diarrhea. Before your exam is started, you will be given a small amount of fluid to drink. Depending on your individual set of symptoms, you may also receive an intravenous injection of x-ray contrast/dye. Plan on being at [facility] for 30  minutes or long, depending on the type of exam you are having performed.  If you have any questions regarding your exam or if you need to reschedule, you may call the CT department at [phone] between the hours of 8:00 am and 5:00 pm, Monday-Friday.  ________________________________________________________________________

## 2013-01-14 ENCOUNTER — Encounter (HOSPITAL_COMMUNITY): Payer: Self-pay | Admitting: *Deleted

## 2013-01-14 ENCOUNTER — Encounter (HOSPITAL_COMMUNITY): Payer: Self-pay | Admitting: Emergency Medicine

## 2013-01-14 ENCOUNTER — Emergency Department (HOSPITAL_COMMUNITY)
Admission: EM | Admit: 2013-01-14 | Discharge: 2013-01-14 | Disposition: A | Discharge status: Home / Self Care | Payer: Medicare Other | Attending: Emergency Medicine | Admitting: Emergency Medicine

## 2013-01-14 ENCOUNTER — Encounter: Payer: Self-pay | Admitting: Gastroenterology

## 2013-01-14 ENCOUNTER — Telehealth: Payer: Self-pay | Admitting: Family Medicine

## 2013-01-14 ENCOUNTER — Telehealth: Payer: Self-pay | Admitting: Gastroenterology

## 2013-01-14 DIAGNOSIS — R5381 Other malaise: Secondary | ICD-10-CM | POA: Insufficient documentation

## 2013-01-14 DIAGNOSIS — Z9861 Coronary angioplasty status: Secondary | ICD-10-CM | POA: Insufficient documentation

## 2013-01-14 DIAGNOSIS — R110 Nausea: Secondary | ICD-10-CM | POA: Insufficient documentation

## 2013-01-14 DIAGNOSIS — Z8673 Personal history of transient ischemic attack (TIA), and cerebral infarction without residual deficits: Secondary | ICD-10-CM | POA: Insufficient documentation

## 2013-01-14 DIAGNOSIS — R42 Dizziness and giddiness: Secondary | ICD-10-CM | POA: Insufficient documentation

## 2013-01-14 DIAGNOSIS — I509 Heart failure, unspecified: Secondary | ICD-10-CM | POA: Insufficient documentation

## 2013-01-14 DIAGNOSIS — Z7902 Long term (current) use of antithrombotics/antiplatelets: Secondary | ICD-10-CM | POA: Insufficient documentation

## 2013-01-14 DIAGNOSIS — R197 Diarrhea, unspecified: Secondary | ICD-10-CM | POA: Insufficient documentation

## 2013-01-14 DIAGNOSIS — Z8719 Personal history of other diseases of the digestive system: Secondary | ICD-10-CM | POA: Insufficient documentation

## 2013-01-14 DIAGNOSIS — Z87448 Personal history of other diseases of urinary system: Secondary | ICD-10-CM | POA: Insufficient documentation

## 2013-01-14 DIAGNOSIS — E785 Hyperlipidemia, unspecified: Secondary | ICD-10-CM | POA: Insufficient documentation

## 2013-01-14 DIAGNOSIS — IMO0002 Reserved for concepts with insufficient information to code with codable children: Secondary | ICD-10-CM | POA: Insufficient documentation

## 2013-01-14 DIAGNOSIS — R112 Nausea with vomiting, unspecified: Secondary | ICD-10-CM

## 2013-01-14 DIAGNOSIS — Z8679 Personal history of other diseases of the circulatory system: Secondary | ICD-10-CM | POA: Insufficient documentation

## 2013-01-14 DIAGNOSIS — F411 Generalized anxiety disorder: Secondary | ICD-10-CM | POA: Insufficient documentation

## 2013-01-14 DIAGNOSIS — Z8739 Personal history of other diseases of the musculoskeletal system and connective tissue: Secondary | ICD-10-CM | POA: Insufficient documentation

## 2013-01-14 DIAGNOSIS — E079 Disorder of thyroid, unspecified: Secondary | ICD-10-CM | POA: Insufficient documentation

## 2013-01-14 DIAGNOSIS — Z8669 Personal history of other diseases of the nervous system and sense organs: Secondary | ICD-10-CM | POA: Insufficient documentation

## 2013-01-14 DIAGNOSIS — D649 Anemia, unspecified: Secondary | ICD-10-CM | POA: Insufficient documentation

## 2013-01-14 DIAGNOSIS — I10 Essential (primary) hypertension: Secondary | ICD-10-CM | POA: Insufficient documentation

## 2013-01-14 DIAGNOSIS — R630 Anorexia: Secondary | ICD-10-CM | POA: Insufficient documentation

## 2013-01-14 DIAGNOSIS — E860 Dehydration: Secondary | ICD-10-CM

## 2013-01-14 DIAGNOSIS — E538 Deficiency of other specified B group vitamins: Secondary | ICD-10-CM | POA: Insufficient documentation

## 2013-01-14 DIAGNOSIS — I82409 Acute embolism and thrombosis of unspecified deep veins of unspecified lower extremity: Secondary | ICD-10-CM | POA: Insufficient documentation

## 2013-01-14 DIAGNOSIS — I82402 Acute embolism and thrombosis of unspecified deep veins of left lower extremity: Secondary | ICD-10-CM

## 2013-01-14 DIAGNOSIS — M7989 Other specified soft tissue disorders: Secondary | ICD-10-CM

## 2013-01-14 DIAGNOSIS — N390 Urinary tract infection, site not specified: Secondary | ICD-10-CM

## 2013-01-14 DIAGNOSIS — Z79899 Other long term (current) drug therapy: Secondary | ICD-10-CM | POA: Insufficient documentation

## 2013-01-14 LAB — URINE MICROSCOPIC-ADD ON

## 2013-01-14 LAB — CBC WITH DIFFERENTIAL/PLATELET
Basophils Absolute: 0 10*3/uL (ref 0.0–0.1)
Basophils Relative: 0 % (ref 0–1)
HCT: 29 % — ABNORMAL LOW (ref 36.0–46.0)
Lymphocytes Relative: 7 % — ABNORMAL LOW (ref 12–46)
Neutro Abs: 6.5 10*3/uL (ref 1.7–7.7)
Neutrophils Relative %: 85 % — ABNORMAL HIGH (ref 43–77)
Platelets: 182 10*3/uL (ref 150–400)
RDW: 16 % — ABNORMAL HIGH (ref 11.5–15.5)
WBC: 7.7 10*3/uL (ref 4.0–10.5)

## 2013-01-14 LAB — BASIC METABOLIC PANEL
CO2: 18 mEq/L — ABNORMAL LOW (ref 19–32)
Chloride: 106 mEq/L (ref 96–112)
GFR calc Af Amer: 46 mL/min — ABNORMAL LOW (ref >90)
Potassium: 4.4 mEq/L (ref 3.5–5.1)
Sodium: 132 mEq/L — ABNORMAL LOW (ref 135–145)

## 2013-01-14 LAB — URINALYSIS, ROUTINE W REFLEX MICROSCOPIC
Glucose, UA: NEGATIVE mg/dL
Nitrite: NEGATIVE
pH: 5 (ref 5.0–8.0)

## 2013-01-14 MED ORDER — ENOXAPARIN SODIUM 80 MG/0.8ML ~~LOC~~ SOLN: 80.0000 mg | Freq: Two times a day (BID) | SUBCUTANEOUS | Status: DC

## 2013-01-14 MED ORDER — HYDROCHLOROTHIAZIDE 12.5 MG PO TABS: 6.2500 mg | ORAL_TABLET | Freq: Every day | ORAL | Status: DC

## 2013-01-14 MED ORDER — CEFPODOXIME PROXETIL 100 MG PO TABS: 100.0000 mg | ORAL_TABLET | Freq: Two times a day (BID) | ORAL | Status: DC

## 2013-01-14 MED ORDER — SODIUM CHLORIDE 0.9 % IV BOLUS (SEPSIS)
1000.0000 mL | Freq: Once | INTRAVENOUS | Status: AC
  Administered 2013-01-14: 1000 mL via INTRAVENOUS

## 2013-01-14 MED ORDER — ENOXAPARIN SODIUM 80 MG/0.8ML ~~LOC~~ SOLN
1.0000 mg/kg | Freq: Once | SUBCUTANEOUS | Status: AC
  Administered 2013-01-14: 80 mg via SUBCUTANEOUS
  Filled 2013-01-14: qty 0.8

## 2013-01-14 MED ORDER — CEFPODOXIME PROXETIL 200 MG PO TABS
200.0000 mg | ORAL_TABLET | Freq: Once | ORAL | Status: AC
  Administered 2013-01-14: 200 mg via ORAL
  Filled 2013-01-14: qty 1

## 2013-01-14 MED ORDER — SODIUM CHLORIDE 0.9 % IV SOLN
INTRAVENOUS | Status: DC
  Administered 2013-01-14 (×2): via INTRAVENOUS

## 2013-01-14 NOTE — ED Notes (Signed)
Had patient attempt to urinate for [place], patient was unable to urinate at this time. Patient does have call bell at bedside.

## 2013-01-14 NOTE — ED Notes (Signed)
Bed: WA15 Expected date:  Expected time:  Means of arrival:  Comments: diarrhea 

## 2013-01-14 NOTE — ED Notes (Signed)
Per EMS: pt was called by PCP for abnormal labs today and was told to come to ER. Denies pain and any other problems.

## 2013-01-14 NOTE — Progress Notes (Signed)
01/14/2013 [name] [id number] 1924-11-01   History of Present Illness:  This is a pleasant 79 year old [place] lady who is known to Dr. Arlyce Dice.  She has had a somewhat confusing picture of medical illness over the past several weeks.  She was admitted to [facility] from 12/07/12-12/12/12 for diarrhea, hypokalemia, and dehydration.  GI was consulted for diarrhea and she underwent colonoscopy.  Colonoscopy revealed colitis, but biopsy results were non-specific; biopsies showed acute colitis without chronic changes, but it was decided to treat with her a short course of steroids to see if it helped.  She followed up with Dr. Arlyce Dice on 8/11 at which time she had said that the prednisone did not really help the diarrhea, but decrease urgency and made her feel much better with better appetite and more energy overall.  It was again presumed to be IBD because the course was thought to be too long for infectious etiology.  She was started on Lialda 4.8 grams daily.  CRP and IBD panel were checked and were normal/negative for IBD.  She comes in today, 11 days later, with her daughter stating that she has not felt well since starting the Lialda and is wondering if she is having side effects or intolerance of the medication.  She says that she has been nauseated every day since she has been taking it and has not seemed to help so far either.  Still with diarrhea.  Appetite is poor again and has no energy.  She is asking if we can put her back on a low dose of prednisone since she felt so much better on that medication.    Her daughter expresses strong concern that shortly prior to her mother developing this diarrhea that they received a recall on some fruit due to Listeria contamination, but the patient is unsure if she ate any of that fruit.  Just of note, patient did not receive any antibiotics while she was in the hospital.     Current Medications, Allergies, Past Medical History, Past Surgical History, Family  History and Social History were reviewed in [employer] record.   Physical Exam: BP 100/50  Pulse 105  Ht 5\' 5"  (1.651 m)  Wt 175 lb 8 oz (79.606 kg)  BMI 29.2 kg/m2  SpO2 98% General:  Elderly white female in no acute distress Head: Normocephalic and atraumatic Eyes:  Sclerae anicteric, conjunctiva pink  Ears: Normal auditory acuity. Lungs: Clear throughout to auscultation Heart: Slightly tachy but with regular rhythm. Abdomen: Soft, non-distended.  BS present.  Non-tender. Musculoskeletal: Symmetrical with no gross deformities  Extremities:  Left ankle swelling.  No erythema, warmth, or tenderness anywhere in her ankle or leg. Neurological: Alert oriented x 4, grossly nonfocal Psychological:  Alert and cooperative. Normal mood and affect  Assessment and Recommendations: -Diarrhea with non-specific colitis seen on colonoscopy with biopsies.  She did not have a great response to prednisone in regards to the diarrhea but feels better on it overall.  IBD panel and CRP were normal/negative, arguing against IBD.  This seems to long to be infectious.  Will check CT scan of the abdomen and pelvis with contrast if renal function allows.  She has possible contrast allergy so will need pre-medication prior to the CT scan.  She will discontinue the Lialda, and I will place her back on prednisone 20 mg daily for now.  I am also going to empirically treat her with a course of Vancomycin 250 mg four times a day  for ten days for the daughters concern about Listeria, however, I believe that with her advanced age and immunosuppressed status that she would have become much more ill by now if this diarrhea was secondary to Listeria infection.  Will check BMP to follow potassium levels and renal function.

## 2013-01-14 NOTE — Telephone Encounter (Signed)
She seems to be very dehydrated again so she should go to the ER. She at least needs IV fluids if not another admission. The CT scan cannot be done until her renal function is better

## 2013-01-14 NOTE — Telephone Encounter (Signed)
I called Carley Hammed, the patient's daughter.  [name]'s husband answered and said the ambulance had to pick her up there today. She has been admitted to our hospital.  I called Rose at [facility] CT and cancelled the CT scan we ordered to be done tomorrow 01-15-2013.  I told [name]'s husband to please advise we did call her back later this afternoon today and I would be thinking about her.

## 2013-01-14 NOTE — ED Provider Notes (Signed)
CSN: [id number]     Arrival date & time 01/14/13  1314 History     First MD Initiated Contact with Patient 01/14/13 1332     Chief Complaint  Patient presents with  . Abnormal Lab   (Consider location/radiation/quality/duration/timing/severity/associated sxs/prior Treatment) HPI  Patient was admitted on July 18 when she had a low potassium down to 2.1 and weakness. She states she was having diarrhea. She had a follow up with Dr. Arlyce Dice and she was taking her mesalamine 4 tablets in the morning and he advised her to switch it to twice a day. She states her diarrhea is starting to taper off and get more normal. However she also reports she has lots of nausea and she's unable to eat because the smell of food or seeing food makes her very nauseated. She denies any abdominal pain. She states she feels weak and she was dizzy on standing however now she feels dizzy all the time and all she wants to do is lay down. She denies having any falling. She denies headache, blurred or double vision. She states her mouth feels dry and she's had decreased urinary output. She states she was on Avalide for her blood pressure but while she was in the hospital she was switched to Avapro. Her feet started swelling and Dr. Clent Ridges put her back on her Avalide and potassium pills on August 5. She had blood work done a couple days ago and was called and told that she was dehydrated and her kidney function was worse. She was told to come to the ED.  PCP Dr Clent Ridges GI Dr Arlyce Dice  Past Medical History  Diagnosis Date  . Anemia   . Hyperlipemia   . Hypertension   . Thyroid disease     hypothyroidism  . Anxiety   . Paroxysmal atrial fibrillation   . Vitamin B 12 deficiency   . CHF (congestive heart failure)   . TIA (transient ischemic attack)     4-09  . Microscopic hematuria     benign microscopic hematuria, worked up b Dr Alexis Frock  . Gall stone     porcelain gall bladder with single stone  per CT 02-2009 Dr Birdie Sons  . Osteoporosis     DEXA on 04-30-10  . Myasthenia gravis     sees Dr. Terrace Arabia   . Ulcerative colitis    Past Surgical History  Procedure Laterality Date  . Cardiac catheterization      04/1999  . Abdominal hysterectomy      with oophorectomy  . Colonoscopy  1999    normal   . Colonoscopy N/A 12/10/2012    Procedure: COLONOSCOPY;  Surgeon:  Boop, MD;  Location: [facility];  Service: Endoscopy;  Laterality: N/A;   Family History  Problem Relation Age of Onset  . Heart disease Father    History  Substance Use Topics  . Smoking status: Never Smoker   . Smokeless tobacco: Never Used  . Alcohol Use: No  lives at home Lives with daughter  OB History   Grav Para Term Preterm Abortions TAB SAB Ect Mult Living                 Review of Systems  All other systems reviewed and are negative.    Allergies  Furosemide; Sulfamethoxazole; and Ivp dye  Home Medications   Current Outpatient Rx  Name  Route  Sig  Dispense  Refill  . ALPRAZolam (XANAX) 0.25 MG tablet   Oral  Take 0.25 mg by mouth 2 (two) times daily as needed for anxiety.         . ALPRAZolam (XANAX) 1 MG tablet   Oral   Take 1 mg by mouth at bedtime as needed for sleep.         Marland Kitchen atorvastatin (LIPITOR) 20 MG tablet   Oral   Take 20 mg by mouth daily.          Marland Kitchen buPROPion (WELLBUTRIN XL) 150 MG 24 hr tablet   Oral   Take 1 tablet (150 mg total) by mouth every morning.   30 tablet   11   . clopidogrel (PLAVIX) 75 MG tablet   Oral   Take 1 tablet (75 mg total) by mouth daily.   90 tablet   3   . co-enzyme Q-10 50 MG capsule   Oral   Take 50 mg by mouth daily.         . feeding supplement (ENSURE COMPLETE) LIQD   Oral   Take 237 mLs by mouth daily.   30 Bottle   0   . feeding supplement (RESOURCE BREEZE) LIQD   Oral   Take 1 Container by mouth 2 (two) times daily between meals.   60 Container   0   . fish oil-omega-3 fatty acids 1000 MG capsule   Oral   Take 2 g by  mouth daily.         Marland Kitchen glucosamine-chondroitin 500-400 MG tablet   Oral   Take 1 tablet by mouth 2 (two) times daily.   60 tablet   0   . levothyroxine (SYNTHROID, LEVOTHROID) 125 MCG tablet   Oral   Take 1 tablet (125 mcg total) by mouth daily.   30 tablet   11   . loperamide (IMODIUM) 2 MG capsule   Oral   Take 1 capsule (2 mg total) by mouth 3 (three) times daily.   90 capsule   0   . mesalamine (LIALDA) 1.2 G EC tablet   Oral   Take 4 tablets (4.8 g total) by mouth daily with breakfast.   120 tablet   6   . mycophenolate (CELLCEPT) 500 MG tablet   Oral   Take 1,000 mg by mouth 2 (two) times daily.          . potassium chloride (K-DUR) 10 MEQ tablet   Oral   Take 20 mEq by mouth 4 (four) times daily.         . predniSONE (DELTASONE) 20 MG tablet      Take 1 tab daily.   30 tablet   0   . Probiotic Product (PROBIOTIC DAILY PO)   Oral   Take by mouth.         . ranitidine (ZANTAC) 150 MG tablet   Oral   Take 150 mg by mouth daily.         . vitamin B-12 (CYANOCOBALAMIN) 1000 MCG tablet   Oral   Take 1,000 mcg by mouth daily.         . vitamin C (ASCORBIC ACID) 250 MG tablet   Oral   Take 250 mg by mouth daily.          BP 141/72  Pulse 81  Temp(Src) 97.6 F (36.4 C) (Oral)  Resp 18  SpO2 97%    Vital signs normal   Physical Exam  Nursing note and vitals reviewed. Constitutional: She is oriented to person, place, and time. She appears well-developed  and well-nourished.  Non-toxic appearance. She does not appear ill. No distress.  HENT:  Head: Normocephalic and atraumatic.  Right Ear: External ear normal.  Left Ear: External ear normal.  Nose: Nose normal. No mucosal edema or rhinorrhea.  Mouth/Throat: Mucous membranes are normal. No dental abscesses or edematous.  Tongue slightly dry   Eyes: Conjunctivae and EOM are normal. Pupils are equal, round, and reactive to light.  Slight droop left upper eyelid  Neck: Normal range  of motion and full passive range of motion without pain. Neck supple.  Cardiovascular: Normal rate, regular rhythm and normal heart sounds.  Exam reveals no gallop and no friction rub.   No murmur heard. Pulmonary/Chest: Effort normal and breath sounds normal. No respiratory distress. She has no wheezes. She has no rhonchi. She has no rales. She exhibits no tenderness and no crepitus.  Abdominal: Soft. Normal appearance and bowel sounds are normal. She exhibits no distension. There is no tenderness. There is no rebound and no guarding.  Musculoskeletal: Normal range of motion. She exhibits no edema and no tenderness.  Moves all extremities well. Has mild swelling of her left foot and distal lower leg  Neurological: She is alert and oriented to person, place, and time. She has normal strength. No cranial nerve deficit.  Skin: Skin is warm, dry and intact. No rash noted. No erythema. No pallor.  Psychiatric: She has a normal mood and affect. Her speech is normal and behavior is normal. Her mood appears not anxious.    ED Course   Medications  sodium chloride 0.9 % bolus 1,000 mL (0 mLs Intravenous Stopped 01/14/13 1858)  enoxaparin (LOVENOX) injection 80 mg (80 mg Subcutaneous Given 01/14/13 1858)  cefpodoxime (VANTIN) tablet 200 mg (200 mg Oral Given 01/14/13 1854)     Procedures (including critical care time)  Patient's daughter in the room. We have discussed that she had a transfusion last month but she does not feel like she needs one today. She is agreeable to staying on the Avapro and getting the hydrochlorothiazide as a separate prescription but at a lower dose. She has been vacillating between getting dehydrated and having excess fluid with swelling in her legs. She also points out now she's had swelling of her left lower leg for the past week. Daughter is concerned she may have a clot. Doppler ultrasound ordered.  Daughter in her room states she has given herself lovenox and her husband  after his two knee replacements and is questioning about she giving her mother her monthly Vit B12 injections, advised to discuss with Dr Clent Ridges.   Pt turned over to Dr Gwendolyn Grant at about 5 pm to get her doppler US results and her UA results.   Results for orders placed during the hospital encounter of 01/14/13  CBC WITH DIFFERENTIAL      Result Value Range   WBC 7.7  4.0 - 10.5 K/uL   RBC 3.46 (*) 3.87 - 5.11 MIL/uL   Hemoglobin 9.4 (*) 12.0 - 15.0 g/dL   HCT 16.1 (*) 09.6 - 04.5 %   MCV 83.8  78.0 - 100.0 fL   MCH 27.2  26.0 - 34.0 pg   MCHC 32.4  30.0 - 36.0 g/dL   RDW 40.9 (*) 81.1 - 91.4 %   Platelets 182  150 - 400 K/uL   Neutrophils Relative % 85 (*) 43 - 77 %   Neutro Abs 6.5  1.7 - 7.7 K/uL   Lymphocytes Relative 7 (*) 12 - 46 %  Lymphs Abs 0.5 (*) 0.7 - 4.0 K/uL   Monocytes Relative 8  3 - 12 %   Monocytes Absolute 0.6  0.1 - 1.0 K/uL   Eosinophils Relative 0  0 - 5 %   Eosinophils Absolute 0.0  0.0 - 0.7 K/uL   Basophils Relative 0  0 - 1 %   Basophils Absolute 0.0  0.0 - 0.1 K/uL  BASIC METABOLIC PANEL      Result Value Range   Sodium 132 (*) 135 - 145 mEq/L   Potassium 4.4  3.5 - 5.1 mEq/L   Chloride 106  96 - 112 mEq/L   CO2 18 (*) 19 - 32 mEq/L   Glucose, Bld 93  70 - 99 mg/dL   BUN 26 (*) 6 - 23 mg/dL   Creatinine, Ser 0.27 (*) 0.50 - 1.10 mg/dL   Calcium 9.5  8.4 - 25.3 mg/dL   GFR calc non Af Amer 40 (*) >90 mL/min   GFR calc Af Amer 46 (*) >90 mL/min   Laboratory interpretation all normal except her creatinine is elevated at 1.2 however on the 22nd it was 1.6 from a baseline of 1.0. BUN is down from high of 30, bicarbonate has improved from 16-18.     1. DVT (deep venous thrombosis), left   2. Nausea & vomiting   3. Dehydration   4. UTI (urinary tract infection)     Disposition pending but expected discharge  Devoria Albe, MD, FACEP    MDM    Ward Givens, MD 01/15/13 [phone]

## 2013-01-14 NOTE — Telephone Encounter (Signed)
Patient Information:  Caller Name: Carley Hammed  Phone: [phone]  Patient: [name]  Gender: Female  DOB: 1924-11-02  Age: 79 Years  PCP: Gershon Crane [facility])  Office Follow Up:  Does the office need to follow up with this patient?: Yes  Instructions For The Office: Please call Carley Hammed back within 1 hour (by 12:13)  RN Note:  Urgent message sent to office staff for call back within 1 hour.  Symptoms  Reason For Call & Symptoms: GI office reported lab report shows "kidney function out of range" and was instructed to call Dr. Clent Ridges to determine if needs admission or to be seen in office.  Seeing GI for chronic diarrhea.. Significantly  Fatigued; refusing to eat or drink.  Scheduled for Abdominal CT 01/15/13.  Currently on Prednisone. Tylah not present for triage; requesting MD recommendation for follow up.  Reviewed Health History In EMR: N/A  Reviewed Medications In EMR: N/A  Reviewed Allergies In EMR: N/A  Reviewed Surgeries / Procedures: N/A  Date of Onset of Symptoms: 01/14/2013  Guideline(s) Used:  No Protocol Available - Sick Adult  Disposition Per Guideline:   Callback by PCP or Subspecialist within 1 Hour  Reason For Disposition Reached:   Nursing judgment  Advice Given:  N/A  Patient Will Follow Care Advice:  YES

## 2013-01-14 NOTE — ED Provider Notes (Signed)
Care from Dr. Lynelle Doctor. 79 year old female with some recent vomiting and diarrhea presents with some lab abnormalities. Labs drawn last Friday shows no acute renal failure. Labs today show renal color is improving. She also has UTI on her urinalysis. Some left leg swelling resulted in a DVT. DVT results are not back prior Dr. Lynelle Doctor bleeding. I talked with the hospitalist, Dr. Darnelle Catalan, who stated despite having a Foley DVT she would only start Lovenox. Patient has had members who can administer Lovenox. Patient wants to go and is comfortable going home. We'll give her Zenaida Niece for UTI. Stable for discharge.  1. DVT (deep venous thrombosis), left   2. Nausea & vomiting   3. Dehydration   4. UTI (urinary tract infection)      Dagmar Hait, MD 01/14/13 [phone]

## 2013-01-14 NOTE — Progress Notes (Signed)
VASCULAR LAB PRELIMINARY  PRELIMINARY  PRELIMINARY  PRELIMINARY  Left lower extremity venous duplex completed.    Preliminary report:  Left: DVT noted in the CFV, FV, popliteal v, gastrocnemius veins, PTV and peronealv. Superficial thrombosis noted in the GSV.  Right:  No evidence of DVT in the CFV, SFJ, prox FV and prox PFV.    Gardiner Espana, RVT 01/14/2013, 5:49 PM

## 2013-01-14 NOTE — ED Notes (Signed)
Patient attempted to urinate again. Patient was unable to at this time. Patient's family at bedside.Call bell within reach.

## 2013-01-14 NOTE — Telephone Encounter (Signed)
I spoke with [name] and she is going to take pt to the ER.

## 2013-01-15 ENCOUNTER — Telehealth: Payer: Self-pay | Admitting: *Deleted

## 2013-01-15 ENCOUNTER — Other Ambulatory Visit: Payer: Medicare Other

## 2013-01-15 NOTE — Telephone Encounter (Signed)
We did send a prescription to [facility] [address], [place], [place].  Vancomycin HCl 125 mg. I received a fax for a prior authorization request on 01-14-2013.  The patient was taken to the hospital by ambulance on the same day 01-14-2013.  Will speak to Doug Sou PA-C regarding this prescription.  Not sure if we need this at this time.

## 2013-01-16 ENCOUNTER — Telehealth: Payer: Self-pay | Admitting: Gastroenterology

## 2013-01-16 ENCOUNTER — Encounter: Payer: Self-pay | Admitting: Gastroenterology

## 2013-01-16 NOTE — Telephone Encounter (Signed)
I spoke with patient's daughter, Carley Hammed, at length.  The CT scan was cancelled by our office with the understanding that the patient was in the ED and going to be admitted to the hospital, however, patient refused to be admitted.  She was having a lot of anxiety over the CT scan as well.  Also, vancomycin has not been started because of price and requiring prior authorization; this was not completed once again due to the thought that patient was being admitted to the hospital.  She is now on an antibiotic for UTI.  Has DVT's in RLE.  The daughter says that they have an appointment with Dr. Clent Ridges on Tuesday and are going to discuss all of these issues.  She is going to call our office back after the Tuesday appointment and update [place] on the plan.

## 2013-01-17 NOTE — Progress Notes (Signed)
Reviewed and agree with management. Robert D. Kaplan, M.D., FACG  

## 2013-01-17 NOTE — Telephone Encounter (Signed)
Doug Sou PA-C called me to first advise we should reschedule the CT Scan of Abd & Pelvis and go ahead with the Prior authorization for the Vancomycin. Shanda Bumps called me a short while later and told me not to reschedule the CT scan , and to not do the Prior Authorization for the medication.  The daughter spoke to Doug Sou and they are hesitant to do the CT scan at this time and didn't want the medication.  The daughter spoke to Shanda Bumps about her concerns with her mother.

## 2013-01-22 ENCOUNTER — Ambulatory Visit (INDEPENDENT_AMBULATORY_CARE_PROVIDER_SITE_OTHER): Payer: Medicare Other | Admitting: Family Medicine

## 2013-01-22 ENCOUNTER — Encounter: Payer: Self-pay | Admitting: Family Medicine

## 2013-01-22 VITALS — BP 138/62 | HR 103 | Temp 98.0°F | Wt 174.0 lb

## 2013-01-22 DIAGNOSIS — E538 Deficiency of other specified B group vitamins: Secondary | ICD-10-CM

## 2013-01-22 DIAGNOSIS — R197 Diarrhea, unspecified: Secondary | ICD-10-CM

## 2013-01-22 DIAGNOSIS — E876 Hypokalemia: Secondary | ICD-10-CM

## 2013-01-22 DIAGNOSIS — I10 Essential (primary) hypertension: Secondary | ICD-10-CM

## 2013-01-22 DIAGNOSIS — I509 Heart failure, unspecified: Secondary | ICD-10-CM

## 2013-01-22 DIAGNOSIS — I82409 Acute embolism and thrombosis of unspecified deep veins of unspecified lower extremity: Secondary | ICD-10-CM

## 2013-01-22 DIAGNOSIS — I82402 Acute embolism and thrombosis of unspecified deep veins of left lower extremity: Secondary | ICD-10-CM

## 2013-01-22 DIAGNOSIS — N179 Acute kidney failure, unspecified: Secondary | ICD-10-CM

## 2013-01-22 MED ORDER — WARFARIN SODIUM 5 MG PO TABS: 5.0000 mg | ORAL_TABLET | Freq: Every day | ORAL | Status: DC

## 2013-01-22 MED ORDER — CYANOCOBALAMIN 1000 MCG/ML IJ SOLN
1000.0000 ug | Freq: Once | INTRAMUSCULAR | Status: AC
  Administered 2013-01-22: 1000 ug via INTRAMUSCULAR

## 2013-01-22 MED ORDER — IRBESARTAN-HYDROCHLOROTHIAZIDE 300-12.5 MG PO TABS: 1.0000 | ORAL_TABLET | Freq: Every day | ORAL | Status: DC

## 2013-01-22 NOTE — Progress Notes (Signed)
  Subjective:    Patient ID: [name], female    DOB: 06-09-1924, 79 y.o.   MRN: [id number]  HPI Here to follow up an ER visit on 01-14-13 for weakness and for swelling in the left leg and left foot. Dopplers revealed a large DVT in the left leg. She was started on Lovenox and her daughter has been giving this bid. There is no pain in the leg. She denies any chest pain or SOB. Her BP has been stable. The ER had switched her from Avalide to Avapro to giver her less diuretic, but this has resulted in her retaining some fluid in the legs. Her electrolytes including potassium were in the normal range, now that she is taking potassium pills daily. Her diarrhea has resolved and she is down to one BM a day. She is still on Plavix. She does not take aspirin.    Review of Systems  Constitutional: Negative.   Respiratory: Negative.   Cardiovascular: Positive for leg swelling. Negative for chest pain and palpitations.  Gastrointestinal: Negative.        Objective:   Physical Exam  Constitutional: She is oriented to person, place, and time. She appears well-developed and well-nourished. No distress.  Neck: No thyromegaly present.  Cardiovascular: Normal rate, regular rhythm, normal heart sounds and intact distal pulses.  Exam reveals no gallop and no friction rub.   No murmur heard. Pulmonary/Chest: Effort normal and breath sounds normal. No respiratory distress. She has no wheezes. She has no rales.  Musculoskeletal:  1+ edema in the right lower leg. 2+ edema in the left lower leg. No tenderness or cords felt   Lymphadenopathy:    She has no cervical adenopathy.  Neurological: She is alert and oriented to person, place, and time.          Assessment & Plan:  Her diarrhea is stable. HTN is stable. We will switch her back from Avapro to Avalide. Start on Coumadin 5 mg daily in addition to the Lovenox bid. She will see the [facility]in Clinic in 2 days. I anticipate her staying on  anticoagulation for at least 6 months. We will repeat a venous doppler of the leg in 6 months.

## 2013-01-23 DIAGNOSIS — I82409 Acute embolism and thrombosis of unspecified deep veins of unspecified lower extremity: Secondary | ICD-10-CM | POA: Insufficient documentation

## 2013-01-24 ENCOUNTER — Inpatient Hospital Stay (HOSPITAL_COMMUNITY)
Admission: EM | Admit: 2013-01-24 | Discharge: 2013-02-04 | DRG: 854 | Disposition: A | Discharge status: Rehabilitation Facility | Payer: Medicare Other | Attending: Internal Medicine | Admitting: Internal Medicine

## 2013-01-24 ENCOUNTER — Other Ambulatory Visit: Payer: Self-pay | Admitting: General Practice

## 2013-01-24 ENCOUNTER — Encounter (HOSPITAL_COMMUNITY): Payer: Self-pay | Admitting: Emergency Medicine

## 2013-01-24 ENCOUNTER — Encounter (INDEPENDENT_AMBULATORY_CARE_PROVIDER_SITE_OTHER): Payer: Medicare Other | Admitting: Family Medicine

## 2013-01-24 ENCOUNTER — Ambulatory Visit (INDEPENDENT_AMBULATORY_CARE_PROVIDER_SITE_OTHER): Payer: Medicare Other | Admitting: General Practice

## 2013-01-24 DIAGNOSIS — E861 Hypovolemia: Secondary | ICD-10-CM | POA: Diagnosis present

## 2013-01-24 DIAGNOSIS — Z7901 Long term (current) use of anticoagulants: Secondary | ICD-10-CM

## 2013-01-24 DIAGNOSIS — E538 Deficiency of other specified B group vitamins: Secondary | ICD-10-CM | POA: Diagnosis present

## 2013-01-24 DIAGNOSIS — I4891 Unspecified atrial fibrillation: Secondary | ICD-10-CM | POA: Diagnosis present

## 2013-01-24 DIAGNOSIS — L8992 Pressure ulcer of unspecified site, stage 2: Secondary | ICD-10-CM | POA: Diagnosis present

## 2013-01-24 DIAGNOSIS — T458X1A Poisoning by other primarily systemic and hematological agents, accidental (unintentional), initial encounter: Secondary | ICD-10-CM

## 2013-01-24 DIAGNOSIS — K922 Gastrointestinal hemorrhage, unspecified: Secondary | ICD-10-CM | POA: Diagnosis present

## 2013-01-24 DIAGNOSIS — B259 Cytomegaloviral disease, unspecified: Principal | ICD-10-CM | POA: Diagnosis present

## 2013-01-24 DIAGNOSIS — R197 Diarrhea, unspecified: Secondary | ICD-10-CM

## 2013-01-24 DIAGNOSIS — E872 Acidosis, unspecified: Secondary | ICD-10-CM | POA: Diagnosis present

## 2013-01-24 DIAGNOSIS — R5381 Other malaise: Secondary | ICD-10-CM | POA: Diagnosis present

## 2013-01-24 DIAGNOSIS — T45511A Poisoning by anticoagulants, accidental (unintentional), initial encounter: Secondary | ICD-10-CM

## 2013-01-24 DIAGNOSIS — E039 Hypothyroidism, unspecified: Secondary | ICD-10-CM | POA: Diagnosis present

## 2013-01-24 DIAGNOSIS — R634 Abnormal weight loss: Secondary | ICD-10-CM | POA: Diagnosis present

## 2013-01-24 DIAGNOSIS — R110 Nausea: Secondary | ICD-10-CM | POA: Diagnosis present

## 2013-01-24 DIAGNOSIS — M810 Age-related osteoporosis without current pathological fracture: Secondary | ICD-10-CM | POA: Diagnosis present

## 2013-01-24 DIAGNOSIS — L89109 Pressure ulcer of unspecified part of back, unspecified stage: Secondary | ICD-10-CM | POA: Diagnosis present

## 2013-01-24 DIAGNOSIS — A0839 Other viral enteritis: Secondary | ICD-10-CM

## 2013-01-24 DIAGNOSIS — E785 Hyperlipidemia, unspecified: Secondary | ICD-10-CM | POA: Diagnosis present

## 2013-01-24 DIAGNOSIS — I82409 Acute embolism and thrombosis of unspecified deep veins of unspecified lower extremity: Secondary | ICD-10-CM

## 2013-01-24 DIAGNOSIS — D649 Anemia, unspecified: Secondary | ICD-10-CM

## 2013-01-24 DIAGNOSIS — E876 Hypokalemia: Secondary | ICD-10-CM | POA: Diagnosis present

## 2013-01-24 DIAGNOSIS — R630 Anorexia: Secondary | ICD-10-CM | POA: Diagnosis present

## 2013-01-24 DIAGNOSIS — N390 Urinary tract infection, site not specified: Secondary | ICD-10-CM | POA: Diagnosis present

## 2013-01-24 DIAGNOSIS — I10 Essential (primary) hypertension: Secondary | ICD-10-CM | POA: Diagnosis present

## 2013-01-24 DIAGNOSIS — I509 Heart failure, unspecified: Secondary | ICD-10-CM | POA: Diagnosis present

## 2013-01-24 DIAGNOSIS — I129 Hypertensive chronic kidney disease with stage 1 through stage 4 chronic kidney disease, or unspecified chronic kidney disease: Secondary | ICD-10-CM | POA: Diagnosis present

## 2013-01-24 DIAGNOSIS — G7000 Myasthenia gravis without (acute) exacerbation: Secondary | ICD-10-CM | POA: Diagnosis present

## 2013-01-24 DIAGNOSIS — K529 Noninfective gastroenteritis and colitis, unspecified: Secondary | ICD-10-CM

## 2013-01-24 DIAGNOSIS — I5030 Unspecified diastolic (congestive) heart failure: Secondary | ICD-10-CM | POA: Diagnosis present

## 2013-01-24 DIAGNOSIS — Z79899 Other long term (current) drug therapy: Secondary | ICD-10-CM

## 2013-01-24 DIAGNOSIS — D62 Acute posthemorrhagic anemia: Secondary | ICD-10-CM | POA: Diagnosis not present

## 2013-01-24 DIAGNOSIS — E871 Hypo-osmolality and hyponatremia: Secondary | ICD-10-CM | POA: Diagnosis present

## 2013-01-24 DIAGNOSIS — K633 Ulcer of intestine: Secondary | ICD-10-CM | POA: Diagnosis present

## 2013-01-24 DIAGNOSIS — Z87440 Personal history of urinary (tract) infections: Secondary | ICD-10-CM

## 2013-01-24 DIAGNOSIS — I959 Hypotension, unspecified: Secondary | ICD-10-CM | POA: Diagnosis present

## 2013-01-24 DIAGNOSIS — T45515A Adverse effect of anticoagulants, initial encounter: Secondary | ICD-10-CM | POA: Diagnosis present

## 2013-01-24 DIAGNOSIS — N183 Chronic kidney disease, stage 3 unspecified: Secondary | ICD-10-CM | POA: Diagnosis present

## 2013-01-24 DIAGNOSIS — N179 Acute kidney failure, unspecified: Secondary | ICD-10-CM

## 2013-01-24 DIAGNOSIS — R791 Abnormal coagulation profile: Secondary | ICD-10-CM | POA: Diagnosis present

## 2013-01-24 DIAGNOSIS — F411 Generalized anxiety disorder: Secondary | ICD-10-CM | POA: Diagnosis present

## 2013-01-24 DIAGNOSIS — Z8673 Personal history of transient ischemic attack (TIA), and cerebral infarction without residual deficits: Secondary | ICD-10-CM

## 2013-01-24 LAB — POCT INR: INR: 2.3

## 2013-01-24 LAB — CBC WITH DIFFERENTIAL/PLATELET
Basophils Absolute: 0 10*3/uL (ref 0.0–0.1)
Basophils Relative: 0 % (ref 0–1)
Eosinophils Absolute: 0 K/uL (ref 0.0–0.7)
Eosinophils Relative: 0 % (ref 0–5)
HCT: 21.6 % — ABNORMAL LOW (ref 36.0–46.0)
Hemoglobin: 7 g/dL — ABNORMAL LOW (ref 12.0–15.0)
Lymphocytes Relative: 10 % — ABNORMAL LOW (ref 12–46)
Lymphs Abs: 0.6 K/uL — ABNORMAL LOW (ref 0.7–4.0)
MCH: 27.6 pg (ref 26.0–34.0)
MCHC: 32.4 g/dL (ref 30.0–36.0)
MCV: 85 fL (ref 78.0–100.0)
Monocytes Absolute: 0.4 10*3/uL (ref 0.1–1.0)
Monocytes Relative: 8 % (ref 3–12)
Neutro Abs: 4.6 K/uL (ref 1.7–7.7)
Neutrophils Relative %: 82 % — ABNORMAL HIGH (ref 43–77)
Platelets: 240 K/uL (ref 150–400)
RBC: 2.54 MIL/uL — ABNORMAL LOW (ref 3.87–5.11)
RDW: 16.7 % — ABNORMAL HIGH (ref 11.5–15.5)
WBC: 5.6 K/uL (ref 4.0–10.5)

## 2013-01-24 LAB — BASIC METABOLIC PANEL WITH GFR
Chloride: 106 meq/L (ref 96–112)
GFR calc non Af Amer: 35 mL/min — ABNORMAL LOW (ref >90)
Glucose, Bld: 104 mg/dL — ABNORMAL HIGH (ref 70–99)
Potassium: 4.4 meq/L (ref 3.5–5.1)
Sodium: 131 meq/L — ABNORMAL LOW (ref 135–145)

## 2013-01-24 LAB — APTT: aPTT: 113 s — ABNORMAL HIGH (ref 24–37)

## 2013-01-24 LAB — OCCULT BLOOD, POC DEVICE: Fecal Occult Bld: POSITIVE — AB

## 2013-01-24 LAB — BASIC METABOLIC PANEL
BUN: 25 mg/dL — ABNORMAL HIGH (ref 6–23)
CO2: 16 mEq/L — ABNORMAL LOW (ref 19–32)
Calcium: 8.3 mg/dL — ABNORMAL LOW (ref 8.4–10.5)
Creatinine, Ser: 1.32 mg/dL — ABNORMAL HIGH (ref 0.50–1.10)
GFR calc Af Amer: 41 mL/min — ABNORMAL LOW (ref >90)

## 2013-01-24 LAB — PREPARE RBC (CROSSMATCH)

## 2013-01-24 LAB — PROTIME-INR
INR: 2.69 — ABNORMAL HIGH (ref 0.00–1.49)
Prothrombin Time: 27.7 s — ABNORMAL HIGH (ref 11.6–15.2)

## 2013-01-24 MED ORDER — ONDANSETRON HCL 4 MG/2ML IJ SOLN: 4.0000 mg | Freq: Four times a day (QID) | INTRAMUSCULAR | Status: DC | PRN

## 2013-01-24 MED ORDER — CEFPODOXIME PROXETIL 200 MG PO TABS: 100.0000 mg | ORAL_TABLET | Freq: Two times a day (BID) | ORAL | Status: DC

## 2013-01-24 MED ORDER — ONDANSETRON HCL 4 MG/2ML IJ SOLN: 4.0000 mg | Freq: Three times a day (TID) | INTRAMUSCULAR | Status: DC | PRN

## 2013-01-24 MED ORDER — ALPRAZOLAM 0.25 MG PO TABS: 0.2500 mg | ORAL_TABLET | Freq: Two times a day (BID) | ORAL | Status: DC | PRN

## 2013-01-24 MED ORDER — PREDNISONE 10 MG PO TABS
10.0000 mg | ORAL_TABLET | Freq: Every day | ORAL | Status: DC
  Administered 2013-01-25 – 2013-01-31 (×7): 10 mg via ORAL
  Filled 2013-01-24 (×10): qty 1

## 2013-01-24 MED ORDER — SODIUM CHLORIDE 0.9 % IV SOLN: INTRAVENOUS | Status: AC

## 2013-01-24 MED ORDER — SODIUM CHLORIDE 0.9 % IV BOLUS (SEPSIS)
500.0000 mL | Freq: Once | INTRAVENOUS | Status: AC
  Administered 2013-01-24: 500 mL via INTRAVENOUS

## 2013-01-24 MED ORDER — SODIUM CHLORIDE 0.9 % IV SOLN
Freq: Once | INTRAVENOUS | Status: AC
  Administered 2013-01-24: 17:00:00 via INTRAVENOUS

## 2013-01-24 MED ORDER — ONDANSETRON HCL 4 MG PO TABS: 4.0000 mg | ORAL_TABLET | Freq: Four times a day (QID) | ORAL | Status: DC | PRN

## 2013-01-24 MED ORDER — SODIUM CHLORIDE 0.9 % IV SOLN
INTRAVENOUS | Status: DC
  Administered 2013-01-26 – 2013-02-04 (×15): via INTRAVENOUS

## 2013-01-24 MED ORDER — LEVOTHYROXINE SODIUM 125 MCG PO TABS
125.0000 ug | ORAL_TABLET | Freq: Every day | ORAL | Status: DC
  Administered 2013-01-25 – 2013-02-04 (×11): 125 ug via ORAL
  Filled 2013-01-24 (×12): qty 1

## 2013-01-24 MED ORDER — PREDNISONE 10 MG PO TABS
10.0000 mg | ORAL_TABLET | Freq: Once | ORAL | Status: AC
  Administered 2013-01-24: 10 mg via ORAL
  Filled 2013-01-24: qty 1

## 2013-01-24 MED ORDER — WARFARIN SODIUM 1 MG PO TABS
ORAL_TABLET | ORAL | Status: DC
Start: 2013-01-24 — End: 2013-01-24

## 2013-01-24 MED ORDER — PREDNISONE 10 MG PO TABS
10.0000 mg | ORAL_TABLET | Freq: Every day | ORAL | Status: DC
  Filled 2013-01-24: qty 1

## 2013-01-24 MED ORDER — PANTOPRAZOLE SODIUM 40 MG IV SOLR
40.0000 mg | INTRAVENOUS | Status: DC
  Administered 2013-01-25 – 2013-02-04 (×10): 40 mg via INTRAVENOUS
  Filled 2013-01-24 (×12): qty 40

## 2013-01-24 MED ORDER — ALPRAZOLAM 1 MG PO TABS
1.0000 mg | ORAL_TABLET | Freq: Every evening | ORAL | Status: DC | PRN
  Administered 2013-01-26 – 2013-02-03 (×8): 1 mg via ORAL
  Filled 2013-01-24 (×9): qty 1

## 2013-01-24 MED ORDER — MYCOPHENOLATE MOFETIL 250 MG PO CAPS
1000.0000 mg | ORAL_CAPSULE | Freq: Two times a day (BID) | ORAL | Status: DC
  Administered 2013-01-24 – 2013-02-04 (×22): 1000 mg via ORAL
  Filled 2013-01-24 (×24): qty 4

## 2013-01-24 MED ORDER — MYCOPHENOLATE MOFETIL 500 MG PO TABS: 1000.0000 mg | ORAL_TABLET | Freq: Two times a day (BID) | ORAL | Status: DC

## 2013-01-24 MED ORDER — BUPROPION HCL ER (XL) 150 MG PO TB24
150.0000 mg | ORAL_TABLET | Freq: Every day | ORAL | Status: DC
  Administered 2013-01-25 – 2013-02-04 (×11): 150 mg via ORAL
  Filled 2013-01-24 (×12): qty 1

## 2013-01-24 MED ORDER — SODIUM CHLORIDE 0.9 % IJ SOLN
3.0000 mL | Freq: Two times a day (BID) | INTRAMUSCULAR | Status: DC
  Administered 2013-01-24 – 2013-02-03 (×6): 3 mL via INTRAVENOUS

## 2013-01-24 MED ORDER — PANTOPRAZOLE SODIUM 40 MG IV SOLR
40.0000 mg | Freq: Once | INTRAVENOUS | Status: AC
Start: 2013-01-24 — End: 2013-01-24
  Administered 2013-01-24: 40 mg via INTRAVENOUS
  Filled 2013-01-24: qty 40

## 2013-01-24 NOTE — ED Provider Notes (Signed)
CSN: [id number]     Arrival date & time 01/24/13  1545 History   First MD Initiated Contact with Patient 01/24/13 1600     Chief Complaint  Patient presents with  . Abnormal Lab   (Consider location/radiation/quality/duration/timing/severity/associated sxs/prior Treatment) HPI Comments: Pt reports chronic diarrhea that began about 2 months ago, did improve for a while, began again today, had normal output last night, today had more loose stool and reportedly was bloody.  Pt did not see it.  She is living with daughter who did.  Pt has been intermittently anemic by EMS report, was seen at her physician's office and found to have a Hgb of 7.3, lower than prior values and also heme positive stool in the office.  Pt has felt weaker, felt faint today.  No abd pain, no N/V.  She is on coumadin for a DVT in left leg found last month.  She has a h/o myasthenia gravis, is on cellcept.  NO recent abx use, no foreign travel.  She had a colonscopy about 1 month ago, Dr. Arlyce Dice is her usual GI and told she had ulcers and colitis.    Patient is a 79 y.o. female presenting with weakness. The history is provided by medical records, the patient and the EMS personnel.  Weakness This is a chronic problem. Pertinent negatives include no chest pain, no abdominal pain and no shortness of breath.    Past Medical History  Diagnosis Date  . Anemia   . Hyperlipemia   . Hypertension   . Thyroid disease     hypothyroidism  . Anxiety   . Paroxysmal atrial fibrillation   . Vitamin B 12 deficiency   . CHF (congestive heart failure)   . TIA (transient ischemic attack)     4-09  . Microscopic hematuria     benign microscopic hematuria, worked up b Dr Alexis Frock  . Gall stone     porcelain gall bladder with single stone  per CT 02-2009 Dr Birdie Sons  . Osteoporosis     DEXA on 04-30-10  . Myasthenia gravis     sees Dr. Terrace Arabia   . Ulcerative colitis   . DVT of leg (deep venous thrombosis)     left leg    Past  Surgical History  Procedure Laterality Date  . Cardiac catheterization      04/1999  . Abdominal hysterectomy      with oophorectomy  . Colonoscopy  1999    normal   . Colonoscopy N/A 12/10/2012    Procedure: COLONOSCOPY;  Surgeon: Iva Boop, MD;  Location: [facility];  Service: Endoscopy;  Laterality: N/A;   Family History  Problem Relation Age of Onset  . Heart disease Father    History  Substance Use Topics  . Smoking status: Never Smoker   . Smokeless tobacco: Never Used  . Alcohol Use: No   OB History   Grav Para Term Preterm Abortions TAB SAB Ect Mult Living                 Review of Systems  Constitutional: Positive for fatigue. Negative for fever, chills and appetite change.  Respiratory: Negative for shortness of breath.   Cardiovascular: Negative for chest pain.  Gastrointestinal: Positive for diarrhea and blood in stool. Negative for nausea, vomiting and abdominal pain.  Musculoskeletal: Negative for myalgias and back pain.  Neurological: Positive for weakness and light-headedness.  All other systems reviewed and are negative.    Allergies  Furosemide;  Ivp dye; and Sulfamethoxazole  Home Medications   No current outpatient prescriptions on file. BP 111/47  Pulse 68  Temp(Src) 98.2 F (36.8 C) (Oral)  Resp 15  Ht 5\' 5"  (1.651 m)  Wt 173 lb 11.6 oz (78.8 kg)  BMI 28.91 kg/m2  SpO2 99% Physical Exam  Nursing note and vitals reviewed. Constitutional: She appears well-developed and well-nourished. No distress.  HENT:  Head: Normocephalic and atraumatic.  Eyes: Conjunctivae and EOM are normal. No scleral icterus.  Neck: Normal range of motion. Neck supple.  Cardiovascular: Normal rate, regular rhythm and intact distal pulses.   Pulmonary/Chest: Effort normal.  Abdominal: Soft. She exhibits no distension. There is no tenderness. There is no rebound and no guarding.  Genitourinary: Rectal exam shows no external hemorrhoid, no tenderness and anal  tone normal. Guaiac positive stool.  Chaperone present  Neurological: She is alert.  Skin: Skin is warm. No rash noted. She is not diaphoretic. There is pallor.    ED Course  Procedures (including critical care time)  CRITICAL CARE Performed by: Lear Ng. Total critical care time: 30 min Critical care time was exclusive of separately billable procedures and treating other patients. Critical care was necessary to treat or prevent imminent or life-threatening deterioration. Critical care was time spent personally by me on the following activities: development of treatment plan with patient and/or surrogate as well as nursing, discussions with consultants, evaluation of patient's response to treatment, examination of patient, obtaining history from patient or surrogate, ordering and performing treatments and interventions, ordering and review of laboratory studies, ordering and review of radiographic studies, pulse oximetry and re-evaluation of patient's condition.   Labs Review Labs Reviewed  CBC WITH DIFFERENTIAL - Abnormal; Notable for the following:    RBC 2.54 (*)    Hemoglobin 7.0 (*)    HCT 21.6 (*)    RDW 16.7 (*)    Neutrophils Relative % 82 (*)    Lymphocytes Relative 10 (*)    Lymphs Abs 0.6 (*)    All other components within normal limits  BASIC METABOLIC PANEL - Abnormal; Notable for the following:    Sodium 131 (*)    CO2 16 (*)    Glucose, Bld 104 (*)    BUN 25 (*)    Creatinine, Ser 1.32 (*)    Calcium 8.3 (*)    GFR calc non Af Amer 35 (*)    GFR calc Af Amer 41 (*)    All other components within normal limits  APTT - Abnormal; Notable for the following:    aPTT 113 (*)    All other components within normal limits  PROTIME-INR - Abnormal; Notable for the following:    Prothrombin Time 27.7 (*)    INR 2.69 (*)    All other components within normal limits  OCCULT BLOOD, POC DEVICE - Abnormal; Notable for the following:    Fecal Occult Bld POSITIVE (*)     All other components within normal limits  MRSA PCR SCREENING  CLOSTRIDIUM DIFFICILE BY PCR  BASIC METABOLIC PANEL  HEMOGLOBIN  HEMOGLOBIN  HEMOGLOBIN  TYPE AND SCREEN  PREPARE RBC (CROSSMATCH)   Imaging Review No results found.  Pt's BP improved after initial IVF bolus.  Mentation remained alert, oriented even at initial hypotension of 80/40.  Pt with heme positive stool, coagulapathy due to warfarin use due to DVT history.  No abd pain.  BUN is only 25, suggesting volume depletion, unlikely to be upper GI however.  I [facility]  to [facility] . IV protonix as well as 2 units of blood ordered for transfusion.  Will need close monitoring, admission to step down, likely consultation by GI tomorrow.  MDM   1. Anemia   2. GI bleed   3. Warfarin-induced coagulopathy, initial encounter   4. Unspecified hypothyroidism      Pt with low BP, normal mentation, appears pale, history and current hemoccult shows + blood, pt with risks, being on coumadin, has had colitis in the past. Pt will need 2 IV's, admission.  Type and screen ordered, CBC, electrolytes.        Gavin Pound. Eliya Geiman, MD 01/25/13 1610

## 2013-01-24 NOTE — ED Notes (Signed)
IV team attempted second IV site without success.

## 2013-01-24 NOTE — Progress Notes (Signed)
Utilization Review completed.  Kristinia Leavy RN CM  

## 2013-01-24 NOTE — ED Notes (Signed)
Bed: WA21 Expected date:  Expected time:  Means of arrival:  Comments: ems 

## 2013-01-24 NOTE — ED Notes (Signed)
Per EMS patient with Hx of blood clots and who takes Coumadin was sent from her PCP for a Hgb level of 7.3, patient's hemoglobin has been running low over the past month between 7.9 and 10.9. Per EMS patient is steadily leaking red diarrhea which tested positive on a hemoccult test.

## 2013-01-24 NOTE — ED Notes (Signed)
MD at bedside. 

## 2013-01-24 NOTE — H&P (Addendum)
[facility] History and Physical  [name] ZOX:[id number] DOB: 1924/12/20 DOA: 01/24/2013  Referring physician: Dr Oletta Lamas PCP: Nelwyn Salisbury, MD  Specialists: Dr Arlyce Dice  Chief Complaint: diarrhea and bloody bowel m ovement.   HPI: [name] is a 79 y.o. female with recent hospitalization for colitis and a colonoscopy showing  Colon ulcers, recently diagnosed with DVT on coumadin, Myasthenia gravis on cellcept, came in for recurrence of diarrhea and a bloody bowel movement today. She was found to be heme occult positive and her hemoglobin 7. She also complained of generalized weakness and almost fainted today. She denies abd pain or nausea or vomiting or hematemesis. She is on vantin for recently diag UTI and has completed 8 days of 10 day course. She is referred to medical service for admission and GI consult from [place] requested.   Review of Systems: The patient denies anorexia, fever, weight loss,, vision loss, decreased hearing, hoarseness, chest pain, syncope, dyspnea on exertion, peripheral edema, balance deficits, hemoptysis, abdominal pain, severe indigestion/heartburn, hematuria, incontinence, genital sores,suspicious skin lesions, transient blindness, difficulty walking, depression, unusual weight change, abnormal bleeding, enlarged lymph nodes, angioedema, and breast masses.    Past Medical History  Diagnosis Date  . Anemia   . Hyperlipemia   . Hypertension   . Thyroid disease     hypothyroidism  . Anxiety   . Paroxysmal atrial fibrillation   . Vitamin B 12 deficiency   . CHF (congestive heart failure)   . TIA (transient ischemic attack)     4-09  . Microscopic hematuria     benign microscopic hematuria, worked up b Dr Alexis Frock  . Gall stone     porcelain gall bladder with single stone  per CT 02-2009 Dr Birdie Sons  . Osteoporosis     DEXA on 04-30-10  . Myasthenia gravis     sees Dr. Terrace Arabia   . Ulcerative colitis   . DVT of leg (deep venous  thrombosis)     left leg    Past Surgical History  Procedure Laterality Date  . Cardiac catheterization      04/1999  . Abdominal hysterectomy      with oophorectomy  . Colonoscopy  1999    normal   . Colonoscopy N/A 12/10/2012    Procedure: COLONOSCOPY;  Surgeon: Iva Boop, MD;  Location: [facility];  Service: Endoscopy;  Laterality: N/A;   Social History:  reports that she has never smoked. She has never used smokeless tobacco. She reports that she does not drink alcohol or use illicit drugs. where does patient live--home,   Allergies  Allergen Reactions  . Furosemide Rash    REACTION: unspecified  . Ivp Dye [Iodinated Diagnostic Agents] Rash  . Sulfamethoxazole Rash    REACTION: unspecified    Family History  Problem Relation Age of Onset  . Heart disease Father     Prior to Admission medications   Medication Sig Start Date End Date Taking? Authorizing Provider  ALPRAZolam (XANAX) 0.25 MG tablet Take 0.25 mg by mouth 2 (two) times daily as needed for anxiety. 12/07/12  Yes Nelwyn Salisbury, MD  ALPRAZolam Prudy Feeler) 1 MG tablet Take 1 mg by mouth at bedtime as needed for sleep.   Yes Historical Provider, MD  atorvastatin (LIPITOR) 20 MG tablet Take 20 mg by mouth daily with supper.  03/25/12  Yes Nelwyn Salisbury, MD  buPROPion (WELLBUTRIN XL) 150 MG 24 hr tablet Take 1 tablet (150 mg total) by mouth every morning. 03/07/12  03/07/13 Yes Nelwyn Salisbury, MD  cefpodoxime (VANTIN) 100 MG tablet Take 100 mg by mouth 2 (two) times daily. A 10 day course. 01/16/13 01/25/13 Yes Historical Provider, MD  co-enzyme Q-10 50 MG capsule Take 50 mg by mouth at bedtime.    Yes Historical Provider, MD  cyanocobalamin (,VITAMIN B-12,) 1000 MCG/ML injection Inject 1,000 mcg into the muscle every 30 (thirty) days.   Yes Historical Provider, MD  feeding supplement (ENSURE COMPLETE) LIQD Take 237 mLs by mouth daily. 12/12/12  Yes Laveda Norman, MD  feeding supplement (RESOURCE BREEZE) LIQD Take 1 Container  by mouth 2 (two) times daily between meals. 12/12/12  Yes Laveda Norman, MD  fish oil-omega-3 fatty acids 1000 MG capsule Take 1 g by mouth daily.    Yes Historical Provider, MD  glucosamine-chondroitin 500-400 MG tablet Take 1 tablet by mouth 2 (two) times daily. 12/21/12  Yes Nelwyn Salisbury, MD  irbesartan-hydrochlorothiazide (AVALIDE) 300-12.5 MG per tablet Take 1 tablet by mouth daily. 01/22/13  Yes Nelwyn Salisbury, MD  levothyroxine (SYNTHROID, LEVOTHROID) 125 MCG tablet Take 1 tablet (125 mcg total) by mouth daily. 03/07/12  Yes Nelwyn Salisbury, MD  loperamide (IMODIUM) 2 MG capsule Take 1 capsule (2 mg total) by mouth 3 (three) times daily. 12/12/12  Yes Laveda Norman, MD  mycophenolate (CELLCEPT) 500 MG tablet Take 1,000 mg by mouth 2 (two) times daily.    Yes Historical Provider, MD  potassium chloride (K-DUR) 10 MEQ tablet Take 20 mEq by mouth 2 (two) times daily with a meal.  12/25/12  Yes Nelwyn Salisbury, MD  predniSONE (DELTASONE) 20 MG tablet Take 10 mg by mouth daily. End of a taper. 01/23/13 01/30/13 Yes Historical Provider, MD  Probiotic Product (PROBIOTIC DAILY PO) Take by mouth.   Yes Historical Provider, MD  ranitidine (ZANTAC) 150 MG tablet Take 150 mg by mouth daily.   Yes Historical Provider, MD  vitamin C (ASCORBIC ACID) 250 MG tablet Take 250 mg by mouth daily.   Yes Historical Provider, MD  warfarin (COUMADIN) 1 MG tablet Take 1-2 mg by mouth as directed. Dose was changed today from 5mg  daily to 1mg  alternating with 2mg . Starting with 1mg  dose on 9/5. 01/25/13  Yes Historical Provider, MD   Physical Exam: Filed Vitals:   01/24/13 1638  BP: 107/47  Pulse: 76  Temp:   Resp: 18    Constitutional: Vital signs reviewed.  Patient is a well-developed and well-nourished  in no acute distress and cooperative with exam. Alert and oriented x3.  Head: Normocephalic and atraumatic Mouth: no erythema or exudates, MMM Eyes: PERRL, EOMI, conjunctivae normal, No scleral icterus.  Neck: Supple, Trachea  midline normal ROM, No JVD, mass, thyromegaly, or carotid bruit present.  Cardiovascular: RRR, S1 normal, S2 normal, no MRG, pulses symmetric and intact bilaterally Pulmonary/Chest: normal respiratory effort, CTAB, no wheezes, rales, or rhonchi Abdominal: Soft. Non-tender, non-distended, bowel sounds are normal, no masses, organomegaly, or guarding present.  Musculoskeletal: No joint deformities, erythema, or stiffness, ROM full and no nontender Neurological: A&O x3, Strength is normal and symmetric bilaterally,  no focal motor deficit, Skin: Warm, dry and intact. No rash, cyanosis, or clubbing.  Psychiatric: Normal mood and affect. speech and behavior is normal. J  Labs on Admission:  Basic Metabolic Panel:  Recent Labs Lab 01/24/13 1622  NA 131*  K 4.4  CL 106  CO2 16*  GLUCOSE 104*  BUN 25*  CREATININE 1.32*  CALCIUM 8.3*  Liver Function Tests: No results found for this basename: AST, ALT, ALKPHOS, BILITOT, PROT, ALBUMIN,  in the last 168 hours No results found for this basename: LIPASE, AMYLASE,  in the last 168 hours No results found for this basename: AMMONIA,  in the last 168 hours CBC:  Recent Labs Lab 01/24/13 1440 01/24/13 1622  WBC  --  5.6  NEUTROABS  --  4.6  HGB 7.3* 7.0*  HCT  --  21.6*  MCV  --  85.0  PLT  --  240   Cardiac Enzymes: No results found for this basename: CKTOTAL, CKMB, CKMBINDEX, TROPONINI,  in the last 168 hours  BNP (last 3 results)  Recent Labs  12/08/12 0026  PROBNP 465.5*   CBG: No results found for this basename: GLUCAP,  in the last 168 hours  Radiological Exams on Admission: No results found.  EKG: not done.   Assessment/Plan Active Problems: 1. Diarrhea and Lower Gi Bleed:  - admit to step down for active bleeding.  - transfuse 2 units of prbc, and ED trying to get second peripheral line in.  - Q8hr H&H - NPO, IV fluids. IV protonix. - GI consulted from , spoke to Dr Marina Goodell.  - with recent use of  antibiotics, willrule out c diff.  - recent colonoscopy done by dr Arlyce Dice.    2. Hypotension; resolved with FLUIDS.   3. DVT : on coumadin at home, will hold it for active bleed. Will get IR consult for IVC placement in am.   4. Stage 3 CKD: hydration and repeat renal parameters in am.   5. Hyponatremia: probably from dehydration: IV fluids. And repeat ina m.   6. Myasthenia gravis: resume cell cept and prednisone.   7. Hypothyroidism: resume synthroid.   8. Recent diagnosis of UTI; completed 8 days of vantin. We do not have urine cultures and she is currently asymptomatic and in view of her diarrhea we will discontinue it.   9. DVT prophylaxis: no anticoagulation in view of the active bleed and no SCD'S for new DVT.        Code Status: full code Family Communication:none at bedside. Will try to speak with daughter in am.  Disposition Plan: admit to step down  Time spent: 75 min  Tameeka Luo [facility] Pager [phone]  If 7PM-7AM, please contact night-coverage [email or web address] Password [facility] 01/24/2013, 6:11 PM

## 2013-01-25 ENCOUNTER — Encounter (HOSPITAL_COMMUNITY): Payer: Self-pay

## 2013-01-25 DIAGNOSIS — K922 Gastrointestinal hemorrhage, unspecified: Secondary | ICD-10-CM

## 2013-01-25 DIAGNOSIS — R197 Diarrhea, unspecified: Secondary | ICD-10-CM

## 2013-01-25 DIAGNOSIS — K5289 Other specified noninfective gastroenteritis and colitis: Secondary | ICD-10-CM

## 2013-01-25 DIAGNOSIS — K633 Ulcer of intestine: Secondary | ICD-10-CM

## 2013-01-25 LAB — BASIC METABOLIC PANEL
CO2: 17 mEq/L — ABNORMAL LOW (ref 19–32)
Calcium: 7.9 mg/dL — ABNORMAL LOW (ref 8.4–10.5)
Chloride: 110 mEq/L (ref 96–112)
Creatinine, Ser: 1 mg/dL (ref 0.50–1.10)
Glucose, Bld: 92 mg/dL (ref 70–99)

## 2013-01-25 LAB — HEMOGLOBIN: Hemoglobin: 9.5 g/dL — ABNORMAL LOW (ref 12.0–15.0)

## 2013-01-25 LAB — PROTIME-INR: INR: 3.17 — ABNORMAL HIGH (ref 0.00–1.49)

## 2013-01-25 MED ORDER — DIPHENHYDRAMINE HCL 50 MG PO CAPS
50.0000 mg | ORAL_CAPSULE | Freq: Once | ORAL | Status: DC
  Filled 2013-01-25: qty 1

## 2013-01-25 MED ORDER — PREDNISONE 50 MG PO TABS
50.0000 mg | ORAL_TABLET | Freq: Four times a day (QID) | ORAL | Status: DC
  Administered 2013-01-25 – 2013-01-26 (×2): 50 mg via ORAL
  Filled 2013-01-25 (×3): qty 1

## 2013-01-25 MED ORDER — LOPERAMIDE HCL 2 MG PO CAPS
2.0000 mg | ORAL_CAPSULE | Freq: Three times a day (TID) | ORAL | Status: DC | PRN
  Administered 2013-01-26 – 2013-01-27 (×3): 2 mg via ORAL
  Filled 2013-01-25 (×3): qty 1

## 2013-01-25 MED ORDER — LOPERAMIDE HCL 2 MG PO CAPS
2.0000 mg | ORAL_CAPSULE | Freq: Once | ORAL | Status: AC
  Administered 2013-01-25: 2 mg via ORAL
  Filled 2013-01-25: qty 1

## 2013-01-25 NOTE — Consult Note (Signed)
Referring Provider: No ref. provider found Primary Care Physician:  Nelwyn Salisbury, MD Primary Gastroenterologist:  Dr. Arlyce Dice   Reason for Consultation:  Diarrhea and rectal bleeding  HPI: [name] is a 79 y.o. female with recent hospitalization for diarrhea with colonoscopy showing colitis with ulcers throughout the colon.  Biopsies showed nonspecific acute colitis.  Since that time her GI course with the diarrhea has been a somewhat confusing picture.  Work up not definite for IBD, but course is too long to be infectious.    Also, she was recently diagnosed with DVT and placed on coumadin a couple of days ago.  She has myasthenia gravis on cellcept for several years.  Apparently she was at the coumadin clinic yesterday and had an incontinent BM that was bloody so she was sent to the ED.  Hgb was came in for recurrence of diarrhea and a bloody bowel movement today. She was found to be heme occult positive and her hemoglobin 7.3 so she was given a unit of blood and admitted to SD unit.  Says that the bleeding just began yesterday.  She still denies abdominal pain.  Right now she is having to move her bowels every 10 minutes according to nursing staff this AM.  Cdiff is pending.  Hgb is 8.7 grams after transfusion.  INR was 2.69 yesterday and the coumadin is now on hold.   Past Medical History  Diagnosis Date  . Anemia   . Hyperlipemia   . Hypertension   . Thyroid disease     hypothyroidism  . Anxiety   . Paroxysmal atrial fibrillation   . Vitamin B 12 deficiency   . CHF (congestive heart failure)   . TIA (transient ischemic attack)     4-09  . Microscopic hematuria     benign microscopic hematuria, worked up b Dr Alexis Frock  . Gall stone     porcelain gall bladder with single stone  per CT 02-2009 Dr Birdie Sons  . Osteoporosis     DEXA on 04-30-10  . Myasthenia gravis     sees Dr. Terrace Arabia   . Ulcerative colitis   . DVT of leg (deep venous thrombosis)     left leg      Past Surgical History  Procedure Laterality Date  . Cardiac catheterization      04/1999  . Abdominal hysterectomy      with oophorectomy  . Colonoscopy  1999    normal   . Colonoscopy N/A 12/10/2012    Procedure: COLONOSCOPY;  Surgeon: Iva Boop, MD;  Location: [facility];  Service: Endoscopy;  Laterality: N/A;    Prior to Admission medications   Medication Sig Start Date End Date Taking? Authorizing Provider  ALPRAZolam (XANAX) 0.25 MG tablet Take 0.25 mg by mouth 2 (two) times daily as needed for anxiety. 12/07/12  Yes Nelwyn Salisbury, MD  ALPRAZolam Prudy Feeler) 1 MG tablet Take 1 mg by mouth at bedtime as needed for sleep.   Yes Historical Provider, MD  atorvastatin (LIPITOR) 20 MG tablet Take 20 mg by mouth daily with supper.  03/25/12  Yes Nelwyn Salisbury, MD  buPROPion (WELLBUTRIN XL) 150 MG 24 hr tablet Take 1 tablet (150 mg total) by mouth every morning. 03/07/12 03/07/13 Yes Nelwyn Salisbury, MD  cefpodoxime (VANTIN) 100 MG tablet Take 100 mg by mouth 2 (two) times daily. A 10 day course. 01/16/13 01/25/13 Yes Historical Provider, MD  co-enzyme Q-10 50 MG capsule Take 50 mg by mouth  at bedtime.    Yes Historical Provider, MD  cyanocobalamin (,VITAMIN B-12,) 1000 MCG/ML injection Inject 1,000 mcg into the muscle every 30 (thirty) days.   Yes Historical Provider, MD  feeding supplement (ENSURE COMPLETE) LIQD Take 237 mLs by mouth daily. 12/12/12  Yes Laveda Norman, MD  feeding supplement (RESOURCE BREEZE) LIQD Take 1 Container by mouth 2 (two) times daily between meals. 12/12/12  Yes Laveda Norman, MD  fish oil-omega-3 fatty acids 1000 MG capsule Take 1 g by mouth daily.    Yes Historical Provider, MD  glucosamine-chondroitin 500-400 MG tablet Take 1 tablet by mouth 2 (two) times daily. 12/21/12  Yes Nelwyn Salisbury, MD  irbesartan-hydrochlorothiazide (AVALIDE) 300-12.5 MG per tablet Take 1 tablet by mouth daily. 01/22/13  Yes Nelwyn Salisbury, MD  levothyroxine (SYNTHROID, LEVOTHROID) 125 MCG tablet  Take 1 tablet (125 mcg total) by mouth daily. 03/07/12  Yes Nelwyn Salisbury, MD  loperamide (IMODIUM) 2 MG capsule Take 1 capsule (2 mg total) by mouth 3 (three) times daily. 12/12/12  Yes Laveda Norman, MD  mycophenolate (CELLCEPT) 500 MG tablet Take 1,000 mg by mouth 2 (two) times daily.    Yes Historical Provider, MD  potassium chloride (K-DUR) 10 MEQ tablet Take 20 mEq by mouth 2 (two) times daily with a meal.  12/25/12  Yes Nelwyn Salisbury, MD  predniSONE (DELTASONE) 20 MG tablet Take 10 mg by mouth daily. End of a taper. 01/23/13 01/30/13 Yes Historical Provider, MD  Probiotic Product (PROBIOTIC DAILY PO) Take by mouth.   Yes Historical Provider, MD  ranitidine (ZANTAC) 150 MG tablet Take 150 mg by mouth daily.   Yes Historical Provider, MD  vitamin C (ASCORBIC ACID) 250 MG tablet Take 250 mg by mouth daily.   Yes Historical Provider, MD  warfarin (COUMADIN) 1 MG tablet Take 1-2 mg by mouth as directed. Dose was changed today from 5mg  daily to 1mg  alternating with 2mg . Starting with 1mg  dose on 9/5. 01/25/13  Yes Historical Provider, MD    Current Facility-Administered Medications  Medication Dose Route Frequency Provider Last Rate Last Dose  . 0.9 %  sodium chloride infusion   Intravenous STAT Gavin Pound. Ghim, MD      . 0.9 %  sodium chloride infusion   Intravenous Continuous Kathlen Mody, MD 100 mL/hr at 01/25/13 0800    . ALPRAZolam (XANAX) tablet 0.25 mg  0.25 mg Oral BID PRN Kathlen Mody, MD      . ALPRAZolam Prudy Feeler) tablet 1 mg  1 mg Oral QHS PRN Kathlen Mody, MD      . buPROPion (WELLBUTRIN XL) 24 hr tablet 150 mg  150 mg Oral Daily Kathlen Mody, MD      . levothyroxine (SYNTHROID, LEVOTHROID) tablet 125 mcg  125 mcg Oral QAC breakfast Kathlen Mody, MD   125 mcg at 01/25/13 0756  . mycophenolate (CELLCEPT) capsule 1,000 mg  1,000 mg Oral BID Kathlen Mody, MD   1,000 mg at 01/24/13 2046  . ondansetron (ZOFRAN) tablet 4 mg  4 mg Oral Q6H PRN Kathlen Mody, MD       Or  . ondansetron (ZOFRAN)  injection 4 mg  4 mg Intravenous Q6H PRN Kathlen Mody, MD      . pantoprazole (PROTONIX) injection 40 mg  40 mg Intravenous Q24H Kathlen Mody, MD   40 mg at 01/25/13 0520  . predniSONE (DELTASONE) tablet 10 mg  10 mg Oral Q breakfast Kathlen Mody, MD      .  sodium chloride 0.9 % injection 3 mL  3 mL Intravenous Q12H Kathlen Mody, MD   3 mL at 01/24/13 2047    Allergies as of 01/24/2013 - Review Complete 01/24/2013  Allergen Reaction Noted  . Furosemide Rash 11/10/2006  . Ivp dye [iodinated diagnostic agents] Rash 12/31/2012  . Sulfamethoxazole Rash 11/10/2006    Family History  Problem Relation Age of Onset  . Heart disease Father     History   Social History  . Marital Status: Widowed    Spouse Name: N/A    Number of Children: N/A  . Years of Education: N/A   Occupational History  . Not on file.   Social History Main Topics  . Smoking status: Never Smoker   . Smokeless tobacco: Never Used  . Alcohol Use: No  . Drug Use: No  . Sexual Activity: Not on file   Other Topics Concern  . Not on file   Social History Narrative  . No narrative on file    Review of Systems: Ten point ROS is O/W negative except as mentioned in HPI.  Physical Exam: Vital signs in last 24 hours: Temp:  [97.2 F (36.2 C)-98.7 F (37.1 C)] 97.2 F (36.2 C) (09/05 0752) Pulse Rate:  [62-76] 67 (09/05 0700) Resp:  [14-18] 15 (09/05 0700) BP: (81-146)/(44-68) 125/53 mmHg (09/05 0700) SpO2:  [97 %-100 %] 99 % (09/05 0700) Weight:  [173 lb 11.6 oz (78.8 kg)] 173 lb 11.6 oz (78.8 kg) (09/04 1700) Last BM Date: 01/25/13 General:   Alert, Well-developed, well-nourished, pleasant and cooperative in NAD, but appears tired. Head:  Normocephalic and atraumatic. Eyes:  Sclera clear, no icterus.  Conjunctiva pink. Ears:  Normal auditory acuity. Mouth:  No deformity or lesions.   Lungs:  Clear throughout to auscultation.  No wheezes, crackles, or rhonchi.  Heart:  Regular rate and rhythm; no  murmurs, clicks, rubs, or gallops. Abdomen:  Soft, non-distended.  BS normal.  Non-tender.  Rectal:  Deferred  Msk:  Symmetrical without gross deformities. Pulses:  Normal pulses noted. Extremities:  Without clubbing or edema. Neurologic:  Alert and  oriented x4;  grossly normal neurologically. Skin:  Intact without significant lesions or rashes.  Slightly pale. Psych:  Alert and cooperative. Normal mood and affect.  Intake/Output from previous day: 09/04 0701 - 09/05 0700 In: 1760 [I.V.:1060; Blood:700] Out: 1851 [Urine:1850; Stool:1] Intake/Output this shift: Total I/O In: 200 [I.V.:200] Out: -   Lab Results:  Recent Labs  01/24/13 1440 01/24/13 1622 01/25/13 0650  WBC  --  5.6  --   HGB 7.3* 7.0* 8.7*  HCT  --  21.6*  --   PLT  --  240  --    BMET  Recent Labs  01/24/13 1622 01/25/13 0650  NA 131* 135  K 4.4 4.4  CL 106 110  CO2 16* 17*  GLUCOSE 104* 92  BUN 25* 18  CREATININE 1.32* 1.00  CALCIUM 8.3* 7.9*   PT/INR  Recent Labs  01/24/13 1321 01/24/13 1622  LABPROT  --  27.7*  INR 2.3 2.69*   IMPRESSION:  -History of non-specific colitis/colon ulcers with symptoms of diarrhea and now with new onset of rectal bleeding in the setting of anti-coagulation. -ABLA:  S/p one unit of PRBC's. -DVT's with recent initiation of coumadin  PLAN: -Will plan for flex sig tomorrow at 8:30 AM, 01/26/13. -Follow up results of pending Cdiff. -Will give a dose of Imodium now and if Cdiff is negative then ok to schedule  Imodium to be given regularly.   ZEHR, JESSICA D.  01/25/2013, 9:16 AM  Pager number [phone]   [facility] Attending  I have also seen and assessed the patient and agree with the above note. This lady has a persistent diarrhea - prior colonoscopy in July showed ulcers throughout colon w/ non-specific bx results, did NOT have chronic colitis like IBD. Prednisone did seem to help but she never resolved, she got worse with Lialda and now has diarrhea  and bleeding on warfarin for recent DVT. She is immunosuppressed for myasthenia gravis. I am going to perform sigmoidoscopy tomorrow and will see if we can learn anything more from that. The risks and benefits as well as alternatives of endoscopic procedure(s) have been discussed and reviewed. All questions answered. The patient agrees to proceed.   Iva Boop, MD, [facility] [phone] (pager) 01/25/2013 3:21 PM

## 2013-01-25 NOTE — Progress Notes (Signed)
01/25/2013 CARE MANAGEMENT NOTE  Patient:  [name], [name]   Account Number:  [id number]  Date Initiated:  01/25/2013  Documentation initiated by:  Levii Hairfield  Subjective/Objective Assessment:   pt with lower gi bld and recent hx of colonic ulcers.     Action/Plan:   lives at home   Anticipated DC Date:  01/28/2013   Anticipated DC Plan:  HOME/SELF CARE  In-house referral  NA      DC Planning Services  CM consult      PAC Choice  NA   Choice offered to / List presented to:  NA   DME arranged  NA      DME agency  NA     HH arranged  NA      HH agency  NA   Status of service:  In process, will continue to follow Medicare Important Message given?  NA - LOS <3 / Initial given by admissions (If response is "NO", the following Medicare IM given date fields will be blank) Date Medicare IM given:   Date Additional Medicare IM given:    Discharge Disposition:    Per UR Regulation:  Reviewed for med. necessity/level of care/duration of stay  If discussed at Long Length of Stay Meetings, dates discussed:    Comments:  [id number]/Swetha Rayle Stark Jock, BSN, [place] [phone] Chart Reviewed for discharge and hospital needs.Spoke with patient concerning cost of her meds.  States that she has gotten patient assistance for the cellcept and is now able to afford er meds. Discharge needs at time of review:  None Review of patient progress due on [id number].

## 2013-01-25 NOTE — Progress Notes (Signed)
Patient ID: [name], female   DOB: 1925/01/15, 79 y.o.   MRN: [id number] Request received for placement of an IVC filter in pt with hx of GI/rectal bleeding, recent LLE DVT on coumadin. PMH also sig for CKD, myasthenia gravis, colitis with ulcers. Exam: pt awake/alert; chest- CTA bilat, heart- RRR; abd- soft,+BS,NT; ext- no pretibial edema.  Filed Vitals:   01/25/13 1100 01/25/13 1200 01/25/13 1300 01/25/13 1600  BP: 118/47  140/58 138/58  Pulse: 74  73 74  Temp:  98.1 F (36.7 C)    TempSrc:  Oral    Resp: 21  17 16   Height:      Weight:      SpO2: 100%  100% 100%   Past Medical History  Diagnosis Date  . Anemia   . Hyperlipemia   . Hypertension   . Thyroid disease     hypothyroidism  . Anxiety   . Paroxysmal atrial fibrillation   . Vitamin B 12 deficiency   . CHF (congestive heart failure)   . TIA (transient ischemic attack)     4-09  . Microscopic hematuria     benign microscopic hematuria, worked up b Dr Alexis Frock  . Gall stone     porcelain gall bladder with single stone  per CT 02-2009 Dr Birdie Sons  . Osteoporosis     DEXA on 04-30-10  . Myasthenia gravis     sees Dr. Terrace Arabia   . Ulcerative colitis   . DVT of leg (deep venous thrombosis)     left leg    Past Surgical History  Procedure Laterality Date  . Cardiac catheterization      04/1999  . Abdominal hysterectomy      with oophorectomy  . Colonoscopy  1999    normal   . Colonoscopy N/A 12/10/2012    Procedure: COLONOSCOPY;  Surgeon: Iva Boop, MD;  Location: [facility];  Service: Endoscopy;  Laterality: N/A;   Results for orders placed during the hospital encounter of 01/24/13  CLOSTRIDIUM DIFFICILE BY PCR      Result Value Range   C difficile by pcr NEGATIVE  NEGATIVE  MRSA PCR SCREENING      Result Value Range   MRSA by PCR NEGATIVE  NEGATIVE  CBC WITH DIFFERENTIAL      Result Value Range   WBC 5.6  4.0 - 10.5 K/uL   RBC 2.54 (*) 3.87 - 5.11 MIL/uL   Hemoglobin 7.0 (*) 12.0 -  15.0 g/dL   HCT 78.2 (*) 95.6 - 21.3 %   MCV 85.0  78.0 - 100.0 fL   MCH 27.6  26.0 - 34.0 pg   MCHC 32.4  30.0 - 36.0 g/dL   RDW 08.6 (*) 57.8 - 46.9 %   Platelets 240  150 - 400 K/uL   Neutrophils Relative % 82 (*) 43 - 77 %   Neutro Abs 4.6  1.7 - 7.7 K/uL   Lymphocytes Relative 10 (*) 12 - 46 %   Lymphs Abs 0.6 (*) 0.7 - 4.0 K/uL   Monocytes Relative 8  3 - 12 %   Monocytes Absolute 0.4  0.1 - 1.0 K/uL   Eosinophils Relative 0  0 - 5 %   Eosinophils Absolute 0.0  0.0 - 0.7 K/uL   Basophils Relative 0  0 - 1 %   Basophils Absolute 0.0  0.0 - 0.1 K/uL  BASIC METABOLIC PANEL      Result Value Range   Sodium 131 (*) 135 -  145 mEq/L   Potassium 4.4  3.5 - 5.1 mEq/L   Chloride 106  96 - 112 mEq/L   CO2 16 (*) 19 - 32 mEq/L   Glucose, Bld 104 (*) 70 - 99 mg/dL   BUN 25 (*) 6 - 23 mg/dL   Creatinine, Ser 1.61 (*) 0.50 - 1.10 mg/dL   Calcium 8.3 (*) 8.4 - 10.5 mg/dL   GFR calc non Af Amer 35 (*) >90 mL/min   GFR calc Af Amer 41 (*) >90 mL/min  APTT      Result Value Range   aPTT 113 (*) 24 - 37 seconds  PROTIME-INR      Result Value Range   Prothrombin Time 27.7 (*) 11.6 - 15.2 seconds   INR 2.69 (*) 0.00 - 1.49  BASIC METABOLIC PANEL      Result Value Range   Sodium 135  135 - 145 mEq/L   Potassium 4.4  3.5 - 5.1 mEq/L   Chloride 110  96 - 112 mEq/L   CO2 17 (*) 19 - 32 mEq/L   Glucose, Bld 92  70 - 99 mg/dL   BUN 18  6 - 23 mg/dL   Creatinine, Ser 0.96  0.50 - 1.10 mg/dL   Calcium 7.9 (*) 8.4 - 10.5 mg/dL   GFR calc non Af Amer 50 (*) >90 mL/min   GFR calc Af Amer 57 (*) >90 mL/min  HEMOGLOBIN      Result Value Range   Hemoglobin 8.7 (*) 12.0 - 15.0 g/dL  HEMOGLOBIN      Result Value Range   Hemoglobin 9.1 (*) 12.0 - 15.0 g/dL  PROTIME-INR      Result Value Range   Prothrombin Time 31.4 (*) 11.6 - 15.2 seconds   INR 3.17 (*) 0.00 - 1.49  OCCULT BLOOD, POC DEVICE      Result Value Range   Fecal Occult Bld POSITIVE (*) NEGATIVE  TYPE AND SCREEN      Result  Value Range   ABO/RH(D) B POS     Antibody Screen NEG     Sample Expiration 01/27/2013     Unit Number [id number]     Blood Component Type RED CELLS,LR     Unit division 00     Status of Unit ISSUED,FINAL     Transfusion Status OK TO TRANSFUSE     Crossmatch Result Compatible     Unit Number [id number]     Blood Component Type RED CELLS,LR     Unit division 00     Status of Unit ISSUED,FINAL     Transfusion Status OK TO TRANSFUSE     Crossmatch Result Compatible    PREPARE RBC (CROSSMATCH)      Result Value Range   Order Confirmation ORDER PROCESSED BY BLOOD BANK     A/P: Pt with hx of GI/rectal bleeding, recent LLE DVT on coumadin,CKD .Tent plan is for placement of an IVC filter on 9/6. Will premedicate pt with po prednisone/benadryl due to contrast allergy. Pt also scheduled for flex sig at 0830   9/6. Dr. Blake Divine aware of plans. C diff neg.

## 2013-01-25 NOTE — Progress Notes (Signed)
CARE MANAGEMENT NOTE 01/25/2013  Patient:  LEMON, WHITACRE   Account Number:  [id number]  Date Initiated:  01/25/2013  Documentation initiated by:  DAVIS,RHONDA  Subjective/Objective Assessment:   pt with lower gi bld and recent hx of colonic ulcers.     Action/Plan:   lives at home   Anticipated DC Date:  01/28/2013   Anticipated DC Plan:  HOME/SELF CARE  In-house referral  NA      DC Planning Services  CM consult      PAC Choice  NA   Choice offered to / List presented to:  NA   DME arranged  NA      DME agency  NA     HH arranged  NA      HH agency  NA   Status of service:  In process, will continue to follow Medicare Important Message given?  NA - LOS <3 / Initial given by admissions (If response is "NO", the following Medicare IM given date fields will be blank) Date Medicare IM given:   Date Additional Medicare IM given:    Discharge Disposition:    Per UR Regulation:  Reviewed for med. necessity/level of care/duration of stay  If discussed at Long Length of Stay Meetings, dates discussed:    Comments:  [id number]/Rhonda Stark Jock, BSN, [place] [phone] Chart Reviewed for discharge and hospital needs.  Patient has one medication that cost 1000.00 will investigate for assistance if available. Discharge needs at time of review:  None Review of patient progress due on [id number].

## 2013-01-25 NOTE — Progress Notes (Signed)
INITIAL NUTRITION ASSESSMENT  DOCUMENTATION CODES Per approved criteria  -Not Applicable   INTERVENTION: Provide Resource Breeze and Ensure Complete once daily when diet advanced Provide Pro-stat once daily Provide Multivitamin with minerals daily  NUTRITION DIAGNOSIS: Unintentional wt loss related to diarrhea/decreased PO intake as evidenced by 12% wt loss in less than 2 months.   Goal: Pt to meet >/= 90% of their estimated nutrition needs   Monitor:  PO intake Weight Bowel Function Labs  Reason for Assessment: Malnutrition Screening Tool, score of 3  79 y.o. female  Admitting Dx: Lower GI Bleed  ASSESSMENT: 79 y.o. female with recent hospitalization for colitis and a colonoscopy showing Colon ulcers, recently diagnosed with DVT on coumadin, Myasthenia gravis on cellcept, came in for recurrence of diarrhea and a bloody bowel movement today. She was found to be heme occult positive and her hemoglobin 7. She also complained of generalized weakness and almost fainted today. She denies abd pain or nausea or vomiting or hematemesis. She is on vantin for recently diag UTI and has completed 8 days of 10 day course. She is referred to medical service for admission and GI consult from [place] requested.   Pt states that her appetite has been good and she was eatin 3 meals daily PTA; she states that she has been following the low fiber diet since her hospitalization in July. She reports that she still has diarrhea but, it has improved. Per weight history pt has lost 23 lbs (12% wt loss) in less than 2 months. She is unsure why she has lost weight except for eating a little less than she used to and constant diarrhea.  Suspect some form of malnutrition related to rapid weight loss.  Height: Ht Readings from Last 1 Encounters:  01/24/13 5\' 5"  (1.651 m)    Weight: Wt Readings from Last 1 Encounters:  01/24/13 173 lb 11.6 oz (78.8 kg)    Ideal Body Weight: 125 lbs  % Ideal Body  Weight: 138%  Wt Readings from Last 10 Encounters:  01/24/13 173 lb 11.6 oz (78.8 kg)  01/22/13 174 lb (78.926 kg)  01/11/13 175 lb 8 oz (79.606 kg)  12/31/12 184 lb 9.6 oz (83.734 kg)  12/21/12 188 lb (85.276 kg)  12/12/12 196 lb 10.4 oz (89.2 kg)  12/12/12 196 lb 10.4 oz (89.2 kg)  12/07/12 195 lb (88.451 kg)  10/10/12 204 lb (92.534 kg)  03/07/12 218 lb (98.884 kg)    Usual Body Weight: 220 lbs  % Usual Body Weight: 79%  BMI:  Body mass index is 28.91 kg/(m^2).  Estimated Nutritional Needs: Kcal: 1600-1800 Protein: 90-100 grams Fluid: 2.3 L/day  Skin: +1 RLE edema, +2 LLE edema; excoriated buttocks  Diet Order: NPO  EDUCATION NEEDS: -No education needs identified at this time   Intake/Output Summary (Last 24 hours) at 01/25/13 1249 Last data filed at 01/25/13 0800  Gross per 24 hour  Intake   1960 ml  Output   1851 ml  Net    109 ml    Last BM: 9/5; loose, bloody  Labs:   Recent Labs Lab 01/24/13 1622 01/25/13 0650  NA 131* 135  K 4.4 4.4  CL 106 110  CO2 16* 17*  BUN 25* 18  CREATININE 1.32* 1.00  CALCIUM 8.3* 7.9*  GLUCOSE 104* 92    CBG (last 3)  No results found for this basename: GLUCAP,  in the last 72 hours  Scheduled Meds: . sodium chloride   Intravenous STAT  .  buPROPion  150 mg Oral Daily  . levothyroxine  125 mcg Oral QAC breakfast  . mycophenolate  1,000 mg Oral BID  . pantoprazole (PROTONIX) IV  40 mg Intravenous Q24H  . predniSONE  10 mg Oral Q breakfast  . sodium chloride  3 mL Intravenous Q12H    Continuous Infusions: . sodium chloride 100 mL/hr at 01/25/13 0800    Past Medical History  Diagnosis Date  . Anemia   . Hyperlipemia   . Hypertension   . Thyroid disease     hypothyroidism  . Anxiety   . Paroxysmal atrial fibrillation   . Vitamin B 12 deficiency   . CHF (congestive heart failure)   . TIA (transient ischemic attack)     4-09  . Microscopic hematuria     benign microscopic hematuria, worked up b Dr  Alexis Frock  . Gall stone     porcelain gall bladder with single stone  per CT 02-2009 Dr Birdie Sons  . Osteoporosis     DEXA on 04-30-10  . Myasthenia gravis     sees Dr. Terrace Arabia   . Ulcerative colitis   . DVT of leg (deep venous thrombosis)     left leg     Past Surgical History  Procedure Laterality Date  . Cardiac catheterization      04/1999  . Abdominal hysterectomy      with oophorectomy  . Colonoscopy  1999    normal   . Colonoscopy N/A 12/10/2012    Procedure: COLONOSCOPY;  Surgeon: Iva Boop, MD;  Location: [facility];  Service: Endoscopy;  Laterality: N/A;    Ian Malkin RD, LDN Inpatient Clinical Dietitian Pager: [phone] After Hours Pager: [phone]

## 2013-01-25 NOTE — Progress Notes (Signed)
[facility] PROGRESS NOTE  [name] ZOX:[id number] DOB: Jul 23, 1924 DOA: 01/24/2013 PCP: Nelwyn Salisbury, MD  Assessment/Plan:  1. Diarrhea and Lower Gi Bleed:  - admitted to step down for active bleeding.  - transfused 2 units of prbc,  - Q8hr H&H  - NPO, IV fluids. IV protonix.  - GI consulted from [place], spoke to Dr Marina Goodell.  - with recent use of antibiotics, willrule out c diff.  - recent colonoscopy done by dr Arlyce Dice.  2. Hypotension; resolved with FLUIDS.  3. DVT : on coumadin at home, will hold it for active bleed. Will get IR consult for IVC placement. 4. Stage 3 CKD: hydration and repeat renal parameters in am.  5. Hyponatremia: probably from dehydration: IV fluids. And repeat ina m.  6. Myasthenia gravis: resume cell cept and prednisone.  7. Hypothyroidism: resume synthroid.  8. Recent diagnosis of UTI; completed 8 days of vantin. We do not have urine cultures and she is currently asymptomatic and in view of her diarrhea we will discontinue it.  9. DVT prophylaxis: no anticoagulation in view of the active bleed and no SCD'S for new DVT.  Code Status: full code  Family Communication:none at bedside. Will try to speak with daughter in am.  Disposition Plan: continue with step down.     Consultants:  GI  Radiology  Antibiotics:  none  HPI/Subjective: Multiple diarrhea.  Objective: Filed Vitals:   01/25/13 0752  BP:   Pulse:   Temp: 97.2 F (36.2 C)  Resp:     Intake/Output Summary (Last 24 hours) at 01/25/13 0823 Last data filed at 01/25/13 0600  Gross per 24 hour  Intake   1760 ml  Output   1851 ml  Net    -91 ml   Filed Weights   01/24/13 1700  Weight: 78.8 kg (173 lb 11.6 oz)    Exam:   General: alert afebrile   Cardiovascular: s1s2  Respiratory: CTAB  Abdomen: soft NT nd bs+  Musculoskeletal: no pedal edema.   Data Reviewed: Basic Metabolic Panel:  Recent Labs Lab 01/24/13 1622 01/25/13 0650  NA 131* 135  K 4.4  4.4  CL 106 110  CO2 16* 17*  GLUCOSE 104* 92  BUN 25* 18  CREATININE 1.32* 1.00  CALCIUM 8.3* 7.9*   Liver Function Tests: No results found for this basename: AST, ALT, ALKPHOS, BILITOT, PROT, ALBUMIN,  in the last 168 hours No results found for this basename: LIPASE, AMYLASE,  in the last 168 hours No results found for this basename: AMMONIA,  in the last 168 hours CBC:  Recent Labs Lab 01/24/13 1440 01/24/13 1622 01/25/13 0650  WBC  --  5.6  --   NEUTROABS  --  4.6  --   HGB 7.3* 7.0* 8.7*  HCT  --  21.6*  --   MCV  --  85.0  --   PLT  --  240  --    Cardiac Enzymes: No results found for this basename: CKTOTAL, CKMB, CKMBINDEX, TROPONINI,  in the last 168 hours BNP (last 3 results)  Recent Labs  12/08/12 0026  PROBNP 465.5*   CBG: No results found for this basename: GLUCAP,  in the last 168 hours  Recent Results (from the past 240 hour(s))  MRSA PCR SCREENING     Status: None   Collection Time    01/24/13  6:48 PM      Result Value Range Status   MRSA by PCR NEGATIVE  NEGATIVE Final  Comment:            The GeneXpert MRSA Assay (FDA     approved for NASAL specimens     only), is one component of a     comprehensive MRSA colonization     surveillance program. It is not     intended to diagnose MRSA     infection nor to guide or     monitor treatment for     MRSA infections.     Studies: No results found.  Scheduled Meds: . sodium chloride   Intravenous STAT  . buPROPion  150 mg Oral Daily  . levothyroxine  125 mcg Oral QAC breakfast  . mycophenolate  1,000 mg Oral BID  . pantoprazole (PROTONIX) IV  40 mg Intravenous Q24H  . predniSONE  10 mg Oral Q breakfast  . sodium chloride  3 mL Intravenous Q12H   Continuous Infusions: . sodium chloride      Active Problems:   HYPOTHYROIDISM   Myasthenia gravis without exacerbation   HYPERTENSION   Diarrhea   Ulceration, colon   DVT of leg (deep venous thrombosis)   Lower GI bleed    Time  spent: 30 miin    [name]  [facility] Pager [phone]. If 7PM-7AM, please contact night-coverage at [email or web address], password [facility] 01/25/2013, 8:23 AM  LOS: 1 day

## 2013-01-26 ENCOUNTER — Inpatient Hospital Stay (HOSPITAL_COMMUNITY): Payer: Medicare Other

## 2013-01-26 ENCOUNTER — Encounter (HOSPITAL_COMMUNITY)
Admission: EM | Disposition: A | Discharge status: Rehabilitation Facility | Payer: Self-pay | Source: Home / Self Care | Attending: Internal Medicine

## 2013-01-26 DIAGNOSIS — I82409 Acute embolism and thrombosis of unspecified deep veins of unspecified lower extremity: Secondary | ICD-10-CM

## 2013-01-26 HISTORY — PX: FLEXIBLE SIGMOIDOSCOPY: SHX5431

## 2013-01-26 LAB — BASIC METABOLIC PANEL
BUN: 13 mg/dL (ref 6–23)
CO2: 15 mEq/L — ABNORMAL LOW (ref 19–32)
Chloride: 108 mEq/L (ref 96–112)
Creatinine, Ser: 0.82 mg/dL (ref 0.50–1.10)
GFR calc Af Amer: 73 mL/min — ABNORMAL LOW (ref >90)
Potassium: 4 mEq/L (ref 3.5–5.1)

## 2013-01-26 LAB — CBC
Hemoglobin: 9.2 g/dL — ABNORMAL LOW (ref 12.0–15.0)
MCH: 27.6 pg (ref 26.0–34.0)
MCV: 85.3 fL (ref 78.0–100.0)
Platelets: 218 10*3/uL (ref 150–400)
RBC: 3.33 MIL/uL — ABNORMAL LOW (ref 3.87–5.11)
WBC: 5.6 10*3/uL (ref 4.0–10.5)

## 2013-01-26 LAB — HEMOGLOBIN: Hemoglobin: 8.9 g/dL — ABNORMAL LOW (ref 12.0–15.0)

## 2013-01-26 LAB — APTT: aPTT: 63 seconds — ABNORMAL HIGH (ref 24–37)

## 2013-01-26 SURGERY — SIGMOIDOSCOPY, FLEXIBLE
Anesthesia: Moderate Sedation

## 2013-01-26 MED ORDER — IOHEXOL 300 MG/ML  SOLN
50.0000 mL | Freq: Once | INTRAMUSCULAR | Status: AC | PRN
  Administered 2013-01-26: 50 mL

## 2013-01-26 MED ORDER — FENTANYL CITRATE 0.05 MG/ML IJ SOLN
INTRAMUSCULAR | Status: AC
  Filled 2013-01-26: qty 2

## 2013-01-26 MED ORDER — MIDAZOLAM HCL 2 MG/2ML IJ SOLN
INTRAMUSCULAR | Status: AC
  Filled 2013-01-26: qty 2

## 2013-01-26 MED ORDER — FENTANYL CITRATE 0.05 MG/ML IJ SOLN
INTRAMUSCULAR | Status: AC | PRN
  Administered 2013-01-26: 50 ug via INTRAVENOUS

## 2013-01-26 MED ORDER — DIPHENHYDRAMINE HCL 50 MG/ML IJ SOLN
INTRAMUSCULAR | Status: AC
  Filled 2013-01-26: qty 1

## 2013-01-26 MED ORDER — MIDAZOLAM HCL 10 MG/2ML IJ SOLN
INTRAMUSCULAR | Status: DC | PRN
  Administered 2013-01-26: 1 mg via INTRAVENOUS
  Administered 2013-01-26 (×2): 2 mg via INTRAVENOUS

## 2013-01-26 MED ORDER — FENTANYL CITRATE 0.05 MG/ML IJ SOLN
INTRAMUSCULAR | Status: DC | PRN
  Administered 2013-01-26: 15 ug via INTRAVENOUS
  Administered 2013-01-26: 10 ug via INTRAVENOUS
  Administered 2013-01-26: 25 ug via INTRAVENOUS

## 2013-01-26 MED ORDER — DIPHENHYDRAMINE HCL 50 MG/ML IJ SOLN
INTRAMUSCULAR | Status: DC | PRN
  Administered 2013-01-26: 12.5 mg via INTRAVENOUS

## 2013-01-26 MED ORDER — MIDAZOLAM HCL 10 MG/2ML IJ SOLN
INTRAMUSCULAR | Status: AC
  Filled 2013-01-26: qty 2

## 2013-01-26 MED ORDER — MIDAZOLAM HCL 2 MG/2ML IJ SOLN
INTRAMUSCULAR | Status: AC | PRN
  Administered 2013-01-26: 1 mg via INTRAVENOUS

## 2013-01-26 MED ORDER — SODIUM CHLORIDE 0.9 % IV SOLN
INTRAVENOUS | Status: DC
  Administered 2013-01-26: 500 mL via INTRAVENOUS

## 2013-01-26 NOTE — Op Note (Signed)
[facility] [address] [place], [address]   FLEXIBLE SIGMOIDOSCOPY PROCEDURE REPORT  PATIENT: Lauren, Marks  MR#: [id number] BIRTHDATE: 10-21-1924 , 88  yrs. old GENDER: Female ENDOSCOPIST: Iva Boop, MD, [facility] PROCEDURE DATE:  01/26/2013 PROCEDURE:   Sigmoidoscopy with biopsy ASA CLASS:   Class III INDICATIONS:hematochezia. hx colitis 11/2012, immunosupressed (myasthenia medication) MEDICATIONS: Fentanyl 50 mcg IV, Versed 5 mg IV, and Benadryl 12.5 mg IV  DESCRIPTION OF PROCEDURE:   After the risks benefits and alternatives of the procedure were thoroughly explained, informed consent was obtained.  revealed no abnormalities of the rectum. The endoscope was introduced through the anus  and advanced to the descending colon , limited by No adverse events experienced.   The quality of the prep was fair .  The instrument was then slowly withdrawn as the mucosa was fully examined.       1.COLON FINDINGS: Medium sized ulcers were found in the sigmoid colon and descending colon.  Multiple biopsies were performed using cold forceps.  Ulcers were rounded, superficial and scattered between normal tissue. 2.  The colon mucosa was otherwise normal. Retroflexed views revealed no abnormalities.    The scope was then withdrawn from the patient and the procedure terminated.  COMPLICATIONS: There were no complications.  ENDOSCOPIC IMPRESSION: 1.   Ulcers/colitis in the sigmoid colon and descending colon; multiple biopsies were performed using cold forceps Similar to 11/2012 colonoscopy. Not sure what is causing this. 2.   The colon mucosa was otherwise normal  RECOMMENDATIONS: await biopsy results she may need vit K for INR?      eSigned:  Iva Boop, MD, [facility] 01/26/2013 8:55 AM

## 2013-01-26 NOTE — Progress Notes (Signed)
[facility] PROGRESS NOTE  [name] ZOX:[id number] DOB: [birth year] DOA: 01/24/2013 PCP: Nelwyn Salisbury, MD  Assessment/Plan:  1. Diarrhea and Lower Gi Bleed:  - admitted to step down for active bleeding.  - transfused 2 units of prbc,  - Q8hr H&H  -  IV fluids. IV protonix. And will start clear liquid after the IVC filter.  - GI consulted from [place], she underwent sigmoidoscopy and was found to have multiple ulcers and colitis in the sigmoid colon.  - with recent use of antibiotics, c diff ruled out  - awaiting biopsy results.  2. Hypotension; resolved with FLUIDS.  3. DVT : on coumadin at home, will hold it for active bleed. Will get IR consult for IVC placement. 4. Stage 3 CKD: hydration and repeat renal parameters in am.  5. Hyponatremia: probably from dehydration: IV fluids. And repeat ina m.  6. Myasthenia gravis: resume cell cept and prednisone.  7. Hypothyroidism: resume synthroid.  8. Recent diagnosis of UTI; completed 8 days of vantin. We do not have urine cultures and she is currently asymptomatic and in view of her diarrhea we will discontinue it.  9. DVT prophylaxis: no anticoagulation in view of the active bleed and no SCD'S for new DVT.  Code Status: full code  Family Communication:none at bedside. Will try to speak with daughter Disposition Plan: continue with step down.     Consultants:  GI  Radiology  Antibiotics:  none  HPI/Subjective: Diarrhea improved   Objective: Filed Vitals:   01/26/13 0907  BP: 137/66  Pulse: 60  Temp:   Resp: 12    Intake/Output Summary (Last 24 hours) at 01/26/13 1232 Last data filed at 01/26/13 0800  Gross per 24 hour  Intake   2000 ml  Output   1900 ml  Net    100 ml   Filed Weights   01/24/13 1700 01/26/13 0000  Weight: 78.8 kg (173 lb 11.6 oz) 76.5 kg (168 lb 10.4 oz)    Exam:   General: alert afebrile   Cardiovascular: s1s2  Respiratory: CTAB  Abdomen: soft NT nd  bs+  Musculoskeletal: no pedal edema.   Data Reviewed: Basic Metabolic Panel:  Recent Labs Lab 01/24/13 1622 01/25/13 0650 01/26/13 0232  NA 131* 135 134*  K 4.4 4.4 4.0  CL 106 110 108  CO2 16* 17* 15*  GLUCOSE 104* 92 108*  BUN 25* 18 13  CREATININE 1.32* 1.00 0.82  CALCIUM 8.3* 7.9* 8.0*   Liver Function Tests: No results found for this basename: AST, ALT, ALKPHOS, BILITOT, PROT, ALBUMIN,  in the last 168 hours No results found for this basename: LIPASE, AMYLASE,  in the last 168 hours No results found for this basename: AMMONIA,  in the last 168 hours CBC:  Recent Labs Lab 01/24/13 1622 01/25/13 0650 01/25/13 1511 01/25/13 2002 01/26/13 0232 01/26/13 1047  WBC 5.6  --   --   --  5.6  --   NEUTROABS 4.6  --   --   --   --   --   HGB 7.0* 8.7* 9.1* 9.5* 9.2* 8.9*  HCT 21.6*  --   --   --  28.4*  --   MCV 85.0  --   --   --  85.3  --   PLT 240  --   --   --  218  --    Cardiac Enzymes: No results found for this basename: CKTOTAL, CKMB, CKMBINDEX, TROPONINI,  in the last 168 hours  BNP (last 3 results)  Recent Labs  12/08/12 0026  PROBNP 465.5*   CBG: No results found for this basename: GLUCAP,  in the last 168 hours  Recent Results (from the past 240 hour(s))  MRSA PCR SCREENING     Status: None   Collection Time    01/24/13  6:48 PM      Result Value Range Status   MRSA by PCR NEGATIVE  NEGATIVE Final   Comment:            The GeneXpert MRSA Assay (FDA     approved for NASAL specimens     only), is one component of a     comprehensive MRSA colonization     surveillance program. It is not     intended to diagnose MRSA     infection nor to guide or     monitor treatment for     MRSA infections.  CLOSTRIDIUM DIFFICILE BY PCR     Status: None   Collection Time    01/25/13  8:00 AM      Result Value Range Status   C difficile by pcr NEGATIVE  NEGATIVE Final   Comment: Performed at [facility]     Studies: No results  found.  Scheduled Meds: . buPROPion  150 mg Oral Daily  . levothyroxine  125 mcg Oral QAC breakfast  . mycophenolate  1,000 mg Oral BID  . pantoprazole (PROTONIX) IV  40 mg Intravenous Q24H  . predniSONE  10 mg Oral Q breakfast  . sodium chloride  3 mL Intravenous Q12H   Continuous Infusions: . sodium chloride 100 mL/hr at 01/25/13 0800    Active Problems:   HYPOTHYROIDISM   Myasthenia gravis without exacerbation   HYPERTENSION   Diarrhea   Ulceration, colon   DVT of leg (deep venous thrombosis)   Lower GI bleed    Time spent: 30 miin    Song Garris  [facility] Pager [phone]. If 7PM-7AM, please contact night-coverage at [email or web address], password [email or web address] 01/26/2013, 12:32 PM  LOS: 2 days

## 2013-01-26 NOTE — Progress Notes (Addendum)
50 mcg of fentanyl given and 5 mg Versed given.  Unable to waste in [place].  Witnessed waste by Dorisann Frames technician 50 mcg of Fentanyl wasted and 5 mg of Versed waster

## 2013-01-26 NOTE — Procedures (Signed)
Successful placement of an infrarenal IVC filter via the right IJ. No immediate complications.

## 2013-01-27 LAB — BASIC METABOLIC PANEL
GFR calc Af Amer: 65 mL/min — ABNORMAL LOW (ref >90)
GFR calc non Af Amer: 56 mL/min — ABNORMAL LOW (ref >90)
Potassium: 3.3 mEq/L — ABNORMAL LOW (ref 3.5–5.1)
Sodium: 135 mEq/L (ref 135–145)

## 2013-01-27 LAB — HEMOGLOBIN: Hemoglobin: 8.9 g/dL — ABNORMAL LOW (ref 12.0–15.0)

## 2013-01-27 LAB — PROTIME-INR: Prothrombin Time: 32.6 seconds — ABNORMAL HIGH (ref 11.6–15.2)

## 2013-01-27 MED ORDER — BOOST / RESOURCE BREEZE PO LIQD
1.0000 | Freq: Every day | ORAL | Status: DC
  Administered 2013-01-27 – 2013-01-28 (×2): 1 via ORAL

## 2013-01-27 MED ORDER — LOPERAMIDE HCL 2 MG PO CAPS
2.0000 mg | ORAL_CAPSULE | Freq: Four times a day (QID) | ORAL | Status: DC | PRN
  Administered 2013-01-27 – 2013-01-28 (×2): 2 mg via ORAL
  Filled 2013-01-27 (×2): qty 1

## 2013-01-27 MED ORDER — POTASSIUM CHLORIDE CRYS ER 20 MEQ PO TBCR
40.0000 meq | EXTENDED_RELEASE_TABLET | Freq: Two times a day (BID) | ORAL | Status: AC
  Administered 2013-01-27 (×2): 40 meq via ORAL
  Filled 2013-01-27 (×2): qty 2

## 2013-01-27 MED ORDER — PRO-STAT SUGAR FREE PO LIQD
30.0000 mL | Freq: Every day | ORAL | Status: DC
  Administered 2013-01-27 – 2013-01-31 (×5): 30 mL via ORAL
  Filled 2013-01-27 (×5): qty 30

## 2013-01-27 MED ORDER — ADULT MULTIVITAMIN LIQUID CH
5.0000 mL | Freq: Every day | ORAL | Status: DC
  Administered 2013-01-27 – 2013-02-04 (×9): 5 mL via ORAL
  Filled 2013-01-27 (×10): qty 5

## 2013-01-27 MED ORDER — FLUCONAZOLE 200 MG PO TABS
200.0000 mg | ORAL_TABLET | Freq: Once | ORAL | Status: AC
  Administered 2013-01-27: 12:00:00 200 mg via ORAL
  Filled 2013-01-27: qty 1

## 2013-01-27 MED ORDER — ENSURE COMPLETE PO LIQD
237.0000 mL | Freq: Every day | ORAL | Status: DC
  Administered 2013-01-27 – 2013-01-28 (×2): 237 mL via ORAL

## 2013-01-27 MED ORDER — BOOST / RESOURCE BREEZE PO LIQD: 1.0000 | ORAL | Status: DC

## 2013-01-27 MED ORDER — ENSURE COMPLETE PO LIQD: 237.0000 mL | ORAL | Status: DC

## 2013-01-27 NOTE — Progress Notes (Signed)
[facility] Progress Note  Subjective:  Still with diarrhea but taking Imodium, which helps some but diarrhea worse after she eats.  Complaining of ongoing dizziness.  Objective:  Vital signs in last 24 hours: Temp:  [97.5 F (36.4 C)-98.7 F (37.1 C)] 97.6 F (36.4 C) (09/07 0800) Pulse Rate:  [51-84] 55 (09/07 0600) Resp:  [12-20] 14 (09/07 0600) BP: (92-153)/(31-77) 110/49 mmHg (09/07 0600) SpO2:  [97 %-100 %] 98 % (09/07 0600) Last BM Date: 01/26/13 General:   Alert, Well-developed, in NAD Heart:  Slightly bradycardic but with regular rhythm. Pulm:  CTAB.  No W/R/R. Abdomen:  Soft, nontender and nondistended. Normal bowel sounds.   Extremities:  Without edema. Neurologic:  Alert and  oriented x4;  grossly normal neurologically. Psych:  Alert and cooperative. Normal mood and affect.  Intake/Output from previous day: 09/06 0701 - 09/07 0700 In: 2920 [P.O.:720; I.V.:2200] Out: 650 [Urine:450; Stool:200]  Lab Results:  Recent Labs  01/24/13 1622  01/26/13 0232 01/26/13 1047 01/26/13 1840 01/27/13 0250  WBC 5.6  --  5.6  --   --   --   HGB 7.0*  < > 9.2* 8.9* 9.5* 8.9*  HCT 21.6*  --  28.4*  --   --   --   PLT 240  --  218  --   --   --   < > = values in this interval not displayed. BMET  Recent Labs  01/24/13 1622 01/25/13 0650 01/26/13 0232  NA 131* 135 134*  K 4.4 4.4 4.0  CL 106 110 108  CO2 16* 17* 15*  GLUCOSE 104* 92 108*  BUN 25* 18 13  CREATININE 1.32* 1.00 0.82  CALCIUM 8.3* 7.9* 8.0*   PT/INR  Recent Labs  01/25/13 1554 01/26/13 0232  LABPROT 31.4* 30.3*  INR 3.17* 3.03*    Ir Ivc Filter Plmt / S&i /img Guid/mod Sed  01/26/2013   *RADIOLOGY REPORT*  Indication:  History of lower extremity DVT, now with GI bleed while on anticoagulation  ULTRASOUND GUIDANCE FOR VASCULAR ACCESS IVC CATHETERIZATION AND VENOGRAM IVC FILTER INSERTION  Comparison: CT abdomen pelvis - 04/07/2009  Medications: Fentanyl 100 mcg IV; Versed 1 mg IV   Contrast: 45 mL Omnipaque-300  Sedation time: 15 minutes  Fluoroscopy time: 1 minute, 54 seconds of  Complications: None immediate  PROCEDURE:  Informed consent was obtained from the patient following explanation of the procedure, risks, benefits and alternatives. The patient understands, agrees and consents for the procedure. All questions were addressed.  A time out was performed prior to the initiation of the procedure.  Maximal barrier sterile technique utilized including caps, mask, sterile gowns, sterile gloves, large sterile drape, hand hygiene, and Betadine prep.  Under sterile condition and local anesthesia, right internal jugular venous access was performed with ultrasound.  An ultrasound image was saved and sent to PACS.  Over a guide wire, the IVC filter delivery sheath and inner dilator were advanced into the IVC just above the IVC bifurcation.  Contrast injection was performed for an IVC venogram.  Through the delivery sheath, a retrievable Denali IVC filter was deployed below the level of the renal veins and above the IVC bifurcation.  Limited post deployment venacavagram was performed.  The delivery sheath was removed and hemostasis was obtained with manual compression.  A dressing was placed.  The patient tolerated the procedure well without immediate post procedural complication.  Findings:  The IVC is patent without evidence of thrombus, stenosis, or occlusion.  No  variant venous anatomy.   Successful placement of the IVC filter below the level of the renal veins.  IMPRESSION:  Successful ultrasound and fluoroscopic guided placement of an infrarenal retrievable IVC filter via right jugular approach.   Original Report Authenticated By: Tacey Ruiz, MD    Assessment / Plan: -Diarrhea and rectal bleeding with complicated/confusing picture and course.  Repeat flex sig yesterday showed ulcers and colitis in left colon.  Biopsies taken and are pending; will follow-up on those results. -ABLA: S/p  two units of PRBC's.  Hgb fairly stable, but with ongoing dizziness (? if she would benefit for one more unit of blood).  -DVT's with recent initiation of coumadin.  Coumadin now held because of bleeding.  IVC filter placed yesterday afternoon.    LOS: 3 days   ZEHR, JESSICA D.  01/27/2013, 10:22 AM  Pager number [phone]   [facility] Attending  I have also seen and assessed the patient and agree with the above note. Await pathology results to see if we learn anything new re: cause of ulcers and diarrhea.  Iva Boop, MD, [facility] [phone] (pager) 01/27/2013 2:04 PM

## 2013-01-27 NOTE — Progress Notes (Signed)
Pt IV leaking, pt states that she is a very hard stick. IV team came to assess and was unable to get IV access with doppler. MD paged. Dr. Blake Divine does not want to place PICC line at this time, MD requested that second IV team member look for IV access. If unable to gain IV access tonight MD stated that she will review morning labs and reassess. Pt and family member informed of plan and are in agreement.  Pt and family question if port- a- cath would be appropriate and stated they will ask MD tomorrow. Shawnmichael Parenteau A

## 2013-01-27 NOTE — Progress Notes (Signed)
[facility] PROGRESS NOTE  [name] ZOX:[id number] DOB: 08-Jan-1925 DOA: 01/24/2013 PCP: Nelwyn Salisbury, MD  Assessment/Plan:  1. Diarrhea and Lower Gi Bleed:  - admitted to step down for active bleeding.  - transfused 2 units of prbc,  - Q8hr H&H shows stable H&H -  IV fluids. Started her on clear liquid diet and advanced as tolerated.  - GI consulted from [place], she underwent sigmoidoscopy and was found to have multiple ulcers and colitis in the sigmoid colon.  - with recent use of antibiotics, c diff ruled out  - awaiting biopsy results.  2. Hypotension; resolved with FLUIDS.  3. DVT : on coumadin at home, will hold it for active bleed. IR consulted and IVC filter placed on 9/6. 4. Stage 3 CKD: hydration and repeat renal parameters  Show an improvement.  5. Hyponatremia: probably from dehydration: IV fluids. And repeat in am .  6. Myasthenia gravis: resume cell cept and prednisone.  7. Hypothyroidism: resume synthroid.  8. Recent diagnosis of UTI; completed 8 days of vantin. We do not have urine cultures and she is currently asymptomatic and in view of her diarrhea we will discontinue it.  9. DVT prophylaxis: no anticoagulation in view of the active bleed and no SCD'S for new DVT.  10. Sacral ulcer: as per the patient, started 3 weeks ago, no tenderness. Wound care will be consulted.  11. Mild metabolic acidosis: her AG is 11. Bicarb is 15.  Code Status: full code  Family Communication:none at bedside.  Disposition Plan: transfer to telemetry     Consultants:  GI  Radiology  Antibiotics:  none  HPI/Subjective: Diarrhea improved, reported she felt light headed today sitting in the chair. We will get orthostatics.   Objective: Filed Vitals:   01/27/13 0800  BP:   Pulse:   Temp: 97.6 F (36.4 C)  Resp:     Intake/Output Summary (Last 24 hours) at 01/27/13 0834 Last data filed at 01/27/13 0700  Gross per 24 hour  Intake   2820 ml  Output    650  ml  Net   2170 ml   Filed Weights   01/24/13 1700 01/26/13 0000  Weight: 78.8 kg (173 lb 11.6 oz) 76.5 kg (168 lb 10.4 oz)    Exam:   General: alert afebrile   Cardiovascular: s1s2  Respiratory: CTAB  Abdomen: soft NT nd bs+  Musculoskeletal: no pedal edema.   Data Reviewed: Basic Metabolic Panel:  Recent Labs Lab 01/24/13 1622 01/25/13 0650 01/26/13 0232  NA 131* 135 134*  K 4.4 4.4 4.0  CL 106 110 108  CO2 16* 17* 15*  GLUCOSE 104* 92 108*  BUN 25* 18 13  CREATININE 1.32* 1.00 0.82  CALCIUM 8.3* 7.9* 8.0*   Liver Function Tests: No results found for this basename: AST, ALT, ALKPHOS, BILITOT, PROT, ALBUMIN,  in the last 168 hours No results found for this basename: LIPASE, AMYLASE,  in the last 168 hours No results found for this basename: AMMONIA,  in the last 168 hours CBC:  Recent Labs Lab 01/24/13 1622  01/25/13 2002 01/26/13 0232 01/26/13 1047 01/26/13 1840 01/27/13 0250  WBC 5.6  --   --  5.6  --   --   --   NEUTROABS 4.6  --   --   --   --   --   --   HGB 7.0*  < > 9.5* 9.2* 8.9* 9.5* 8.9*  HCT 21.6*  --   --  28.4*  --   --   --  MCV 85.0  --   --  85.3  --   --   --   PLT 240  --   --  218  --   --   --   < > = values in this interval not displayed. Cardiac Enzymes: No results found for this basename: CKTOTAL, CKMB, CKMBINDEX, TROPONINI,  in the last 168 hours BNP (last 3 results)  Recent Labs  12/08/12 0026  PROBNP 465.5*   CBG: No results found for this basename: GLUCAP,  in the last 168 hours  Recent Results (from the past 240 hour(s))  MRSA PCR SCREENING     Status: None   Collection Time    01/24/13  6:48 PM      Result Value Range Status   MRSA by PCR NEGATIVE  NEGATIVE Final   Comment:            The GeneXpert MRSA Assay (FDA     approved for NASAL specimens     only), is one component of a     comprehensive MRSA colonization     surveillance program. It is not     intended to diagnose MRSA     infection nor to  guide or     monitor treatment for     MRSA infections.  CLOSTRIDIUM DIFFICILE BY PCR     Status: None   Collection Time    01/25/13  8:00 AM      Result Value Range Status   C difficile by pcr NEGATIVE  NEGATIVE Final   Comment: Performed at [facility]     Studies: Ir Ivc Filter Plmt / S&i /img Guid/mod Sed  01/26/2013   *RADIOLOGY REPORT*  Indication:  History of lower extremity DVT, now with GI bleed while on anticoagulation  ULTRASOUND GUIDANCE FOR VASCULAR ACCESS IVC CATHETERIZATION AND VENOGRAM IVC FILTER INSERTION  Comparison: CT abdomen pelvis - 04/07/2009  Medications: Fentanyl 100 mcg IV; Versed 1 mg IV  Contrast: 45 mL Omnipaque-300  Sedation time: 15 minutes  Fluoroscopy time: 1 minute, 54 seconds of  Complications: None immediate  PROCEDURE:  Informed consent was obtained from the patient following explanation of the procedure, risks, benefits and alternatives. The patient understands, agrees and consents for the procedure. All questions were addressed.  A time out was performed prior to the initiation of the procedure.  Maximal barrier sterile technique utilized including caps, mask, sterile gowns, sterile gloves, large sterile drape, hand hygiene, and Betadine prep.  Under sterile condition and local anesthesia, right internal jugular venous access was performed with ultrasound.  An ultrasound image was saved and sent to PACS.  Over a guide wire, the IVC filter delivery sheath and inner dilator were advanced into the IVC just above the IVC bifurcation.  Contrast injection was performed for an IVC venogram.  Through the delivery sheath, a retrievable Denali IVC filter was deployed below the level of the renal veins and above the IVC bifurcation.  Limited post deployment venacavagram was performed.  The delivery sheath was removed and hemostasis was obtained with manual compression.  A dressing was placed.  The patient tolerated the procedure well without immediate post procedural  complication.  Findings:  The IVC is patent without evidence of thrombus, stenosis, or occlusion.  No variant venous anatomy.   Successful placement of the IVC filter below the level of the renal veins.  IMPRESSION:  Successful ultrasound and fluoroscopic guided placement of an infrarenal retrievable IVC filter via right jugular approach.  Original Report Authenticated By: Tacey Ruiz, MD    Scheduled Meds: . buPROPion  150 mg Oral Daily  . levothyroxine  125 mcg Oral QAC breakfast  . mycophenolate  1,000 mg Oral BID  . pantoprazole (PROTONIX) IV  40 mg Intravenous Q24H  . predniSONE  10 mg Oral Q breakfast  . sodium chloride  3 mL Intravenous Q12H   Continuous Infusions: . sodium chloride 100 mL/hr at 01/27/13 0300    Active Problems:   HYPOTHYROIDISM   Myasthenia gravis without exacerbation   HYPERTENSION   Diarrhea   Ulceration, colon   DVT of leg (deep venous thrombosis)   Lower GI bleed    Time spent: 30 miin    Leighann Amadon  [facility] Pager [phone]. If 7PM-7AM, please contact night-coverage at [email or web address], password [facility] 01/27/2013, 8:34 AM  LOS: 3 days

## 2013-01-28 ENCOUNTER — Encounter (HOSPITAL_COMMUNITY): Payer: Self-pay | Admitting: Internal Medicine

## 2013-01-28 LAB — TYPE AND SCREEN
ABO/RH(D): B POS
Antibody Screen: NEGATIVE
Unit division: 0
Unit division: 0
Unit division: 0

## 2013-01-28 LAB — MAGNESIUM: Magnesium: 1.7 mg/dL (ref 1.5–2.5)

## 2013-01-28 LAB — CBC
HCT: 26.9 % — ABNORMAL LOW (ref 36.0–46.0)
Hemoglobin: 8.9 g/dL — ABNORMAL LOW (ref 12.0–15.0)
MCH: 27.9 pg (ref 26.0–34.0)
MCHC: 33.1 g/dL (ref 30.0–36.0)
MCV: 84.3 fL (ref 78.0–100.0)
Platelets: 200 K/uL (ref 150–400)
RBC: 3.19 MIL/uL — ABNORMAL LOW (ref 3.87–5.11)
RDW: 16.5 % — ABNORMAL HIGH (ref 11.5–15.5)
WBC: 5.2 K/uL (ref 4.0–10.5)

## 2013-01-28 LAB — PROTIME-INR
INR: 2.33 — ABNORMAL HIGH (ref 0.00–1.49)
Prothrombin Time: 24.8 seconds — ABNORMAL HIGH (ref 11.6–15.2)

## 2013-01-28 LAB — BASIC METABOLIC PANEL
CO2: 18 mEq/L — ABNORMAL LOW (ref 19–32)
Calcium: 7.9 mg/dL — ABNORMAL LOW (ref 8.4–10.5)
GFR calc Af Amer: 72 mL/min — ABNORMAL LOW (ref >90)
Sodium: 135 mEq/L (ref 135–145)

## 2013-01-28 MED ORDER — MESALAMINE 1.2 G PO TBEC
4.8000 g | DELAYED_RELEASE_TABLET | Freq: Every day | ORAL | Status: DC
  Administered 2013-01-28 – 2013-01-29 (×2): 4.8 g via ORAL
  Filled 2013-01-28 (×4): qty 4

## 2013-01-28 MED ORDER — DIGOXIN 250 MCG PO TABS
0.2500 mg | ORAL_TABLET | Freq: Once | ORAL | Status: DC
  Filled 2013-01-28: qty 1

## 2013-01-28 MED ORDER — METOPROLOL TARTRATE 25 MG PO TABS
25.0000 mg | ORAL_TABLET | Freq: Once | ORAL | Status: AC
  Administered 2013-01-28: 25 mg via ORAL
  Filled 2013-01-28: qty 1

## 2013-01-28 MED ORDER — BOOST / RESOURCE BREEZE PO LIQD
1.0000 | Freq: Two times a day (BID) | ORAL | Status: DC
  Administered 2013-01-29 – 2013-02-04 (×13): 1 via ORAL

## 2013-01-28 MED ORDER — PHYTONADIONE 5 MG PO TABS
5.0000 mg | ORAL_TABLET | Freq: Once | ORAL | Status: AC
  Administered 2013-01-28: 5 mg via ORAL
  Filled 2013-01-28: qty 1

## 2013-01-28 MED ORDER — LOPERAMIDE HCL 2 MG PO CAPS
2.0000 mg | ORAL_CAPSULE | Freq: Two times a day (BID) | ORAL | Status: DC
  Administered 2013-01-28 – 2013-01-31 (×6): 2 mg via ORAL
  Filled 2013-01-28 (×8): qty 1

## 2013-01-28 MED ORDER — DIGOXIN 250 MCG PO TABS
0.2500 mg | ORAL_TABLET | Freq: Once | ORAL | Status: AC
  Administered 2013-01-28: 0.25 mg via ORAL
  Filled 2013-01-28: qty 1

## 2013-01-28 MED ORDER — DIGOXIN 125 MCG PO TABS
0.1250 mg | ORAL_TABLET | Freq: Every day | ORAL | Status: DC
  Administered 2013-01-29 – 2013-02-04 (×7): 0.125 mg via ORAL
  Filled 2013-01-28 (×7): qty 1

## 2013-01-28 NOTE — Progress Notes (Signed)
Patient ID: [name], female   DOB: 01/13/1925, 79 y.o.   MRN: [id number] [facility] Progress Note  Subjective: Dizzy, lightheaded past several days-feels better lying down- no HA. No abdominal  pain- Diarrhea has slowed- one BM during night and one early am- unaware  Of bleeding   Objective:  Vital signs in last 24 hours: Temp:  [97.8 F (36.6 C)-98.5 F (36.9 C)] 98.5 F (36.9 C) (09/08 0544) Pulse Rate:  [63-74] 66 (09/08 0544) Resp:  [16-18] 16 (09/08 0544) BP: (95-158)/(62-77) 95/62 mmHg (09/08 0544) SpO2:  [96 %-99 %] 98 % (09/08 0544) Weight:  [177 lb 4 oz (80.4 kg)] 177 lb 4 oz (80.4 kg) (09/07 1208) Last BM Date: 01/28/13 General:   Alert,  Well-developed,elderly WF    in NAD Heart:  Regular rate and rhythm; no murmurs Pulm;clear Abdomen:  Soft, nontender and nondistended. Normal bowel sounds, without guarding, and without rebound.   Extremities:  Without edema. Neurologic:  Alert and  oriented x4;  Psych:  Alert and cooperative. Normal mood and affect.  Intake/Output from previous day: 09/07 0701 - 09/08 0700 In: 1160 [P.O.:360; I.V.:800] Out: 200 [Stool:200] Intake/Output this shift: Total I/O In: -  Out: 600 [Urine:600]  Lab Results:  Recent Labs  01/26/13 0232  01/26/13 1840 01/27/13 0250 01/28/13 0400  WBC 5.6  --   --   --  5.2  HGB 9.2*  < > 9.5* 8.9* 8.9*  HCT 28.4*  --   --   --  26.9*  PLT 218  --   --   --  200  < > = values in this interval not displayed. BMET  Recent Labs  01/26/13 0232 01/27/13 1114 01/28/13 0400  NA 134* 135 135  K 4.0 3.3* 3.8  CL 108 107 110  CO2 15* 17* 18*  GLUCOSE 108* 116* 127*  BUN 13 11 12   CREATININE 0.82 0.90 0.83  CALCIUM 8.0* 8.4 7.9*   LFT No results found for this basename: PROT, ALBUMIN, AST, ALT, ALKPHOS, BILITOT, BILIDIR, IBILI,  in the last 72 hours PT/INR  Recent Labs  01/26/13 0232 01/27/13 1114  LABPROT 30.3* 32.6*  INR 3.03* 3.33*      Assessment /  Plan: # 1 79 yo female with diarrhea and rectal bleeding- had an acute colitis on BX 7 /2014- unresponsive to steroids empirically Significant colitis on flex yesterday - bx pending Will change Imodium to scheduled doses- 2 BID Await BX  Active Problems:   HYPOTHYROIDISM   Myasthenia gravis without exacerbation   HYPERTENSION   Diarrhea   Ulceration, colon   DVT of leg (deep venous thrombosis)   Lower GI bleed     LOS: 4 days   Amy Esterwood  01/28/2013, 9:31 AM   ________________________________________________________________________  [facility] MD note:  I personally examined the patient, reviewed the data and agree with the assessment and plan described above.  Awaiting biopsy results. Cell Cept is described to have "colitis" as potential AR.  Will changed to scheduled imodium dosing as above. Will also try mesalamine orally.   Rob Bunting, MD [facility] Pager [phone]

## 2013-01-28 NOTE — Progress Notes (Signed)
*  PRELIMINARY RESULTS* Echocardiogram 2D Echocardiogram has been performed.  Jeryl Columbia 01/28/2013, 2:27 PM

## 2013-01-28 NOTE — Progress Notes (Signed)
Pt converted back into NSR with HR 80-90's. MD informed. Will continue to monitor. Sami Roes A

## 2013-01-28 NOTE — Progress Notes (Addendum)
[facility] PROGRESS NOTE  [name] RUE:[id number] DOB: 1925/05/23 DOA: 01/24/2013 PCP: Nelwyn Salisbury, MD  Assessment/Plan:  1. Diarrhea and Lower Gi Bleed:  - admitted to step down for active bleeding.  - transfused 2 units of prbc,  - Q8hr H&H shows stable H&H -  IV fluids. Started her on clear liquid diet and advanced as tolerated.  - GI consulted from [place], she underwent sigmoidoscopy and was found to have multiple ulcers and colitis in the sigmoid colon.  - with recent use of antibiotics, c diff ruled out  - awaiting biopsy results.  2. Hypotension; resolved with FLUIDS.  3. DVT : on coumadin at home,  held it for active bleeding. IR consulted and IVC filter placed on 9/6. 4. Stage 3 CKD: hydration and repeat renal parameters  Showed an improvement.  5. Hyponatremia: probably from dehydration: IV fluids. And repeat in am .  6. Myasthenia gravis: resume cell cept and prednisone.  7. Hypothyroidism: resume synthroid. TSH on 7/19 is 0.640 8. Recent diagnosis of UTI; completed 8 days of vantin. We do not have urine cultures and she is currently asymptomatic and in view of her diarrhea we will discontinue it.  9. DVT prophylaxis: no anticoagulation in view of the active bleed and no SCD'S for new DVT.  10. Sacral ulcer: as per the patient, started 3 weeks ago, no tenderness. Wound care will be consulted.  11. Mild metabolic acidosis: her AG is 11. Bicarb is 15. 12. Chronic Atrial fibrillation: patient has a h/o chronic atrial fibrillation from notes dating 2008 by Dr Clent Ridges. Last night she went into atrial fib, probably secondary to hypovolemia. She was given a dose of dig. Echocardiogram ordered and metoprolol given. She is asymptomatic.   Code Status: full code  Family Communication:none at bedside.  Disposition Plan: possible d/c in am.      Consultants:  GI  Radiology  Antibiotics:  none  HPI/Subjective: Diarrhea improved, last night she went into atrial  fibrillation. Rate is better controlled. She is asymptomatic. She is requesting port a cath placement in stead of PICC LINE.  Objective: Filed Vitals:   01/28/13 0544  BP: 95/62  Pulse: 66  Temp: 98.5 F (36.9 C)  Resp: 16    Intake/Output Summary (Last 24 hours) at 01/28/13 0936 Last data filed at 01/28/13 0730  Gross per 24 hour  Intake    800 ml  Output    800 ml  Net      0 ml   Filed Weights   01/24/13 1700 01/26/13 0000 01/27/13 1208  Weight: 78.8 kg (173 lb 11.6 oz) 76.5 kg (168 lb 10.4 oz) 80.4 kg (177 lb 4 oz)    Exam:   General: alert afebrile   Cardiovascular: s1s2  Respiratory: CTAB  Abdomen: soft NT nd bs+  Musculoskeletal: no pedal edema.   Data Reviewed: Basic Metabolic Panel:  Recent Labs Lab 01/24/13 1622 01/25/13 0650 01/26/13 0232 01/27/13 1114 01/28/13 0400  NA 131* 135 134* 135 135  K 4.4 4.4 4.0 3.3* 3.8  CL 106 110 108 107 110  CO2 16* 17* 15* 17* 18*  GLUCOSE 104* 92 108* 116* 127*  BUN 25* 18 13 11 12   CREATININE 1.32* 1.00 0.82 0.90 0.83  CALCIUM 8.3* 7.9* 8.0* 8.4 7.9*  MG  --   --   --   --  1.7   Liver Function Tests: No results found for this basename: AST, ALT, ALKPHOS, BILITOT, PROT, ALBUMIN,  in the last  168 hours No results found for this basename: LIPASE, AMYLASE,  in the last 168 hours No results found for this basename: AMMONIA,  in the last 168 hours CBC:  Recent Labs Lab 01/24/13 1622  01/26/13 0232 01/26/13 1047 01/26/13 1840 01/27/13 0250 01/28/13 0400  WBC 5.6  --  5.6  --   --   --  5.2  NEUTROABS 4.6  --   --   --   --   --   --   HGB 7.0*  < > 9.2* 8.9* 9.5* 8.9* 8.9*  HCT 21.6*  --  28.4*  --   --   --  26.9*  MCV 85.0  --  85.3  --   --   --  84.3  PLT 240  --  218  --   --   --  200  < > = values in this interval not displayed. Cardiac Enzymes: No results found for this basename: CKTOTAL, CKMB, CKMBINDEX, TROPONINI,  in the last 168 hours BNP (last 3 results)  Recent Labs  12/08/12 0026   PROBNP 465.5*   CBG: No results found for this basename: GLUCAP,  in the last 168 hours  Recent Results (from the past 240 hour(s))  MRSA PCR SCREENING     Status: None   Collection Time    01/24/13  6:48 PM      Result Value Range Status   MRSA by PCR NEGATIVE  NEGATIVE Final   Comment:            The GeneXpert MRSA Assay (FDA     approved for NASAL specimens     only), is one component of a     comprehensive MRSA colonization     surveillance program. It is not     intended to diagnose MRSA     infection nor to guide or     monitor treatment for     MRSA infections.  CLOSTRIDIUM DIFFICILE BY PCR     Status: None   Collection Time    01/25/13  8:00 AM      Result Value Range Status   C difficile by pcr NEGATIVE  NEGATIVE Final   Comment: Performed at [facility]     Studies: Ir Ivc Filter Plmt / S&i /img Guid/mod Sed  01/26/2013   *RADIOLOGY REPORT*  Indication:  History of lower extremity DVT, now with GI bleed while on anticoagulation  ULTRASOUND GUIDANCE FOR VASCULAR ACCESS IVC CATHETERIZATION AND VENOGRAM IVC FILTER INSERTION  Comparison: CT abdomen pelvis - 04/07/2009  Medications: Fentanyl 100 mcg IV; Versed 1 mg IV  Contrast: 45 mL Omnipaque-300  Sedation time: 15 minutes  Fluoroscopy time: 1 minute, 54 seconds of  Complications: None immediate  PROCEDURE:  Informed consent was obtained from the patient following explanation of the procedure, risks, benefits and alternatives. The patient understands, agrees and consents for the procedure. All questions were addressed.  A time out was performed prior to the initiation of the procedure.  Maximal barrier sterile technique utilized including caps, mask, sterile gowns, sterile gloves, large sterile drape, hand hygiene, and Betadine prep.  Under sterile condition and local anesthesia, right internal jugular venous access was performed with ultrasound.  An ultrasound image was saved and sent to PACS.  Over a guide wire, the  IVC filter delivery sheath and inner dilator were advanced into the IVC just above the IVC bifurcation.  Contrast injection was performed for an IVC venogram.  Through the delivery  sheath, a retrievable Denali IVC filter was deployed below the level of the renal veins and above the IVC bifurcation.  Limited post deployment venacavagram was performed.  The delivery sheath was removed and hemostasis was obtained with manual compression.  A dressing was placed.  The patient tolerated the procedure well without immediate post procedural complication.  Findings:  The IVC is patent without evidence of thrombus, stenosis, or occlusion.  No variant venous anatomy.   Successful placement of the IVC filter below the level of the renal veins.  IMPRESSION:  Successful ultrasound and fluoroscopic guided placement of an infrarenal retrievable IVC filter via right jugular approach.   Original Report Authenticated By: Tacey Ruiz, MD    Scheduled Meds: . buPROPion  150 mg Oral Daily  . [START ON 01/29/2013] digoxin  0.125 mg Oral Daily  . digoxin  0.25 mg Oral Once  . feeding supplement  237 mL Oral Daily  . feeding supplement  30 mL Oral Daily  . feeding supplement  1 Container Oral Q1500  . levothyroxine  125 mcg Oral QAC breakfast  . multivitamin  5 mL Oral Daily  . mycophenolate  1,000 mg Oral BID  . pantoprazole (PROTONIX) IV  40 mg Intravenous Q24H  . predniSONE  10 mg Oral Q breakfast  . sodium chloride  3 mL Intravenous Q12H   Continuous Infusions: . sodium chloride 100 mL/hr at 01/27/13 1225    Active Problems:   HYPOTHYROIDISM   Myasthenia gravis without exacerbation   HYPERTENSION   Diarrhea   Ulceration, colon   DVT of leg (deep venous thrombosis)   Lower GI bleed    Time spent: 30 miin    Kashauna Celmer  [facility] Pager [phone]. If 7PM-7AM, please contact night-coverage at [email or web address], password [facility] 01/28/2013, 9:36 AM  LOS: 4 days

## 2013-01-28 NOTE — Progress Notes (Signed)
PT Cancellation Note  Patient Details Name: [name] MRN: [id number] DOB: 10-20-1924   Cancelled Treatment:    Reason Eval/Treat Not Completed: Medical issues which prohibited therapy. Pt refused PT x 2 this morning stating she felt dizzy. She had been up in chair earlier this morning. Will follow.    Ralene Bathe Kistler 01/28/2013, 11:52 AM [phone]

## 2013-01-28 NOTE — Progress Notes (Signed)
OT Cancellation Note  Patient Details Name: [name] MRN: [id number] DOB: [birth year]   Cancelled Treatment:    Reason Eval/Treat Not Completed: Medical issues which prohibited therapy. Pt dizzy this am and attempted X3. Pt eating on second attempt. Still dizzy on 3rd attempt.   Lennox Laity [phone] 01/28/2013, 12:30 PM

## 2013-01-28 NOTE — Progress Notes (Signed)
This encounter was created in error - please disregard.

## 2013-01-28 NOTE — Progress Notes (Signed)
Patient has gone into an atrial fib this am with heart rate in the 110's to 130's. BP 95/62. Patient states she feels fine and well rested. Has a history of afib but this RN does not see any meds on her PTA list that relate to this. Will notify [facility] night floor coverage. Continue to monitor. [name]

## 2013-01-28 NOTE — Progress Notes (Signed)
Patient ID: [name], female   DOB: [birth year], 79 y.o.   MRN: [id number] Request received for placement of a port in cath in pt . Pt familiar to our service from recent IVC filter placement 9/6. She currently has no peripheral IV access and family prefers port over PICC placement due to poor venous access and frequent need for IVF/medications/blood/labs.  PT/INR today are 24.8/2.3. Would like INR of 1.6 or less to proceed with port placement. Exam: pt awake/alert; chest- CTA bilat; heart- RRR; abd- soft,+BS,NT; ext- no edema.   Filed Vitals:   01/27/13 1208 01/27/13 2145 01/28/13 0544 01/28/13 1020  BP:  151/77 95/62 104/62  Pulse:  74 66 72  Temp:  98.3 F (36.8 C) 98.5 F (36.9 C) 98.5 F (36.9 C)  TempSrc:  Oral Oral Oral  Resp:  16 16 16   Height: 5\' 5"  (1.651 m)     Weight: 177 lb 4 oz (80.4 kg)     SpO2:  99% 98% 100%   Past Medical History  Diagnosis Date  . Anemia   . Hyperlipemia   . Hypertension   . Thyroid disease     hypothyroidism  . Anxiety   . Paroxysmal atrial fibrillation   . Vitamin B 12 deficiency   . CHF (congestive heart failure)   . TIA (transient ischemic attack)     4-09  . Microscopic hematuria     benign microscopic hematuria, worked up b Dr Alexis Frock  . Gall stone     porcelain gall bladder with single stone  per CT 02-2009 Dr Birdie Sons  . Osteoporosis     DEXA on 04-30-10  . Myasthenia gravis     sees Dr. Terrace Arabia   . Ulcerative colitis   . DVT of leg (deep venous thrombosis)     left leg    Past Surgical History  Procedure Laterality Date  . Cardiac catheterization      04/1999  . Abdominal hysterectomy      with oophorectomy  . Colonoscopy  1999    normal   . Colonoscopy N/A 12/10/2012    Procedure: COLONOSCOPY;  Surgeon: Iva Boop, MD;  Location: [facility];  Service: Endoscopy;  Laterality: N/A;  . Flexible sigmoidoscopy N/A 01/26/2013    Procedure: FLEXIBLE SIGMOIDOSCOPY;  Surgeon: Iva Boop, MD;  Location: [facility]  ENDOSCOPY;  Service: Endoscopy;  Laterality: N/A;   Ir Ivc Filter Plmt / S&i /img Guid/mod Sed  01/26/2013   *RADIOLOGY REPORT*  Indication:  History of lower extremity DVT, now with GI bleed while on anticoagulation  ULTRASOUND GUIDANCE FOR VASCULAR ACCESS IVC CATHETERIZATION AND VENOGRAM IVC FILTER INSERTION  Comparison: CT abdomen pelvis - 04/07/2009  Medications: Fentanyl 100 mcg IV; Versed 1 mg IV  Contrast: 45 mL Omnipaque-300  Sedation time: 15 minutes  Fluoroscopy time: 1 minute, 54 seconds of  Complications: None immediate  PROCEDURE:  Informed consent was obtained from the patient following explanation of the procedure, risks, benefits and alternatives. The patient understands, agrees and consents for the procedure. All questions were addressed.  A time out was performed prior to the initiation of the procedure.  Maximal barrier sterile technique utilized including caps, mask, sterile gowns, sterile gloves, large sterile drape, hand hygiene, and Betadine prep.  Under sterile condition and local anesthesia, right internal jugular venous access was performed with ultrasound.  An ultrasound image was saved and sent to PACS.  Over a guide wire, the IVC filter delivery sheath and inner dilator were  advanced into the IVC just above the IVC bifurcation.  Contrast injection was performed for an IVC venogram.  Through the delivery sheath, a retrievable Denali IVC filter was deployed below the level of the renal veins and above the IVC bifurcation.  Limited post deployment venacavagram was performed.  The delivery sheath was removed and hemostasis was obtained with manual compression.  A dressing was placed.  The patient tolerated the procedure well without immediate post procedural complication.  Findings:  The IVC is patent without evidence of thrombus, stenosis, or occlusion.  No variant venous anatomy.   Successful placement of the IVC filter below the level of the renal veins.  IMPRESSION:  Successful  ultrasound and fluoroscopic guided placement of an infrarenal retrievable IVC filter via right jugular approach.   Original Report Authenticated By: Tacey Ruiz, MD  Results for orders placed during the hospital encounter of 01/24/13  CLOSTRIDIUM DIFFICILE BY PCR      Result Value Range   C difficile by pcr NEGATIVE  NEGATIVE  MRSA PCR SCREENING      Result Value Range   MRSA by PCR NEGATIVE  NEGATIVE  CBC WITH DIFFERENTIAL      Result Value Range   WBC 5.6  4.0 - 10.5 K/uL   RBC 2.54 (*) 3.87 - 5.11 MIL/uL   Hemoglobin 7.0 (*) 12.0 - 15.0 g/dL   HCT 29.5 (*) 62.1 - 30.8 %   MCV 85.0  78.0 - 100.0 fL   MCH 27.6  26.0 - 34.0 pg   MCHC 32.4  30.0 - 36.0 g/dL   RDW 65.7 (*) 84.6 - 96.2 %   Platelets 240  150 - 400 K/uL   Neutrophils Relative % 82 (*) 43 - 77 %   Neutro Abs 4.6  1.7 - 7.7 K/uL   Lymphocytes Relative 10 (*) 12 - 46 %   Lymphs Abs 0.6 (*) 0.7 - 4.0 K/uL   Monocytes Relative 8  3 - 12 %   Monocytes Absolute 0.4  0.1 - 1.0 K/uL   Eosinophils Relative 0  0 - 5 %   Eosinophils Absolute 0.0  0.0 - 0.7 K/uL   Basophils Relative 0  0 - 1 %   Basophils Absolute 0.0  0.0 - 0.1 K/uL  BASIC METABOLIC PANEL      Result Value Range   Sodium 131 (*) 135 - 145 mEq/L   Potassium 4.4  3.5 - 5.1 mEq/L   Chloride 106  96 - 112 mEq/L   CO2 16 (*) 19 - 32 mEq/L   Glucose, Bld 104 (*) 70 - 99 mg/dL   BUN 25 (*) 6 - 23 mg/dL   Creatinine, Ser 9.52 (*) 0.50 - 1.10 mg/dL   Calcium 8.3 (*) 8.4 - 10.5 mg/dL   GFR calc non Af Amer 35 (*) >90 mL/min   GFR calc Af Amer 41 (*) >90 mL/min  APTT      Result Value Range   aPTT 113 (*) 24 - 37 seconds  PROTIME-INR      Result Value Range   Prothrombin Time 27.7 (*) 11.6 - 15.2 seconds   INR 2.69 (*) 0.00 - 1.49  BASIC METABOLIC PANEL      Result Value Range   Sodium 135  135 - 145 mEq/L   Potassium 4.4  3.5 - 5.1 mEq/L   Chloride 110  96 - 112 mEq/L   CO2 17 (*) 19 - 32 mEq/L   Glucose, Bld 92  70 -  99 mg/dL   BUN 18  6 - 23 mg/dL    Creatinine, Ser 1.61  0.50 - 1.10 mg/dL   Calcium 7.9 (*) 8.4 - 10.5 mg/dL   GFR calc non Af Amer 50 (*) >90 mL/min   GFR calc Af Amer 57 (*) >90 mL/min  HEMOGLOBIN      Result Value Range   Hemoglobin 8.7 (*) 12.0 - 15.0 g/dL  HEMOGLOBIN      Result Value Range   Hemoglobin 9.1 (*) 12.0 - 15.0 g/dL  HEMOGLOBIN      Result Value Range   Hemoglobin 9.5 (*) 12.0 - 15.0 g/dL  PROTIME-INR      Result Value Range   Prothrombin Time 31.4 (*) 11.6 - 15.2 seconds   INR 3.17 (*) 0.00 - 1.49  PROTIME-INR      Result Value Range   Prothrombin Time 30.3 (*) 11.6 - 15.2 seconds   INR 3.03 (*) 0.00 - 1.49  CBC      Result Value Range   WBC 5.6  4.0 - 10.5 K/uL   RBC 3.33 (*) 3.87 - 5.11 MIL/uL   Hemoglobin 9.2 (*) 12.0 - 15.0 g/dL   HCT 09.6 (*) 04.5 - 40.9 %   MCV 85.3  78.0 - 100.0 fL   MCH 27.6  26.0 - 34.0 pg   MCHC 32.4  30.0 - 36.0 g/dL   RDW 81.1 (*) 91.4 - 78.2 %   Platelets 218  150 - 400 K/uL  APTT      Result Value Range   aPTT 63 (*) 24 - 37 seconds  BASIC METABOLIC PANEL      Result Value Range   Sodium 134 (*) 135 - 145 mEq/L   Potassium 4.0  3.5 - 5.1 mEq/L   Chloride 108  96 - 112 mEq/L   CO2 15 (*) 19 - 32 mEq/L   Glucose, Bld 108 (*) 70 - 99 mg/dL   BUN 13  6 - 23 mg/dL   Creatinine, Ser 9.56  0.50 - 1.10 mg/dL   Calcium 8.0 (*) 8.4 - 10.5 mg/dL   GFR calc non Af Amer 63 (*) >90 mL/min   GFR calc Af Amer 73 (*) >90 mL/min  HEMOGLOBIN      Result Value Range   Hemoglobin 8.9 (*) 12.0 - 15.0 g/dL  HEMOGLOBIN      Result Value Range   Hemoglobin 9.5 (*) 12.0 - 15.0 g/dL  HEMOGLOBIN      Result Value Range   Hemoglobin 8.9 (*) 12.0 - 15.0 g/dL  PROTIME-INR      Result Value Range   Prothrombin Time 32.6 (*) 11.6 - 15.2 seconds   INR 3.33 (*) 0.00 - 1.49  BASIC METABOLIC PANEL      Result Value Range   Sodium 135  135 - 145 mEq/L   Potassium 3.3 (*) 3.5 - 5.1 mEq/L   Chloride 107  96 - 112 mEq/L   CO2 17 (*) 19 - 32 mEq/L   Glucose, Bld 116 (*) 70 - 99  mg/dL   BUN 11  6 - 23 mg/dL   Creatinine, Ser 2.13  0.50 - 1.10 mg/dL   Calcium 8.4  8.4 - 08.6 mg/dL   GFR calc non Af Amer 56 (*) >90 mL/min   GFR calc Af Amer 65 (*) >90 mL/min  CBC      Result Value Range   WBC 5.2  4.0 - 10.5 K/uL   RBC 3.19 (*) 3.87 -  5.11 MIL/uL   Hemoglobin 8.9 (*) 12.0 - 15.0 g/dL   HCT 40.9 (*) 81.1 - 91.4 %   MCV 84.3  78.0 - 100.0 fL   MCH 27.9  26.0 - 34.0 pg   MCHC 33.1  30.0 - 36.0 g/dL   RDW 78.2 (*) 95.6 - 21.3 %   Platelets 200  150 - 400 K/uL  BASIC METABOLIC PANEL      Result Value Range   Sodium 135  135 - 145 mEq/L   Potassium 3.8  3.5 - 5.1 mEq/L   Chloride 110  96 - 112 mEq/L   CO2 18 (*) 19 - 32 mEq/L   Glucose, Bld 127 (*) 70 - 99 mg/dL   BUN 12  6 - 23 mg/dL   Creatinine, Ser 0.86  0.50 - 1.10 mg/dL   Calcium 7.9 (*) 8.4 - 10.5 mg/dL   GFR calc non Af Amer 62 (*) >90 mL/min   GFR calc Af Amer 72 (*) >90 mL/min  MAGNESIUM      Result Value Range   Magnesium 1.7  1.5 - 2.5 mg/dL  PROTIME-INR      Result Value Range   Prothrombin Time 24.8 (*) 11.6 - 15.2 seconds   INR 2.33 (*) 0.00 - 1.49  OCCULT BLOOD, POC DEVICE      Result Value Range   Fecal Occult Bld POSITIVE (*) NEGATIVE  TYPE AND SCREEN      Result Value Range   ABO/RH(D) B POS     Antibody Screen NEG     Sample Expiration 01/27/2013     Unit Number [id number]     Blood Component Type RED CELLS,LR     Unit division 00     Status of Unit ISSUED,FINAL     Transfusion Status OK TO TRANSFUSE     Crossmatch Result Compatible     Unit Number [id number]     Blood Component Type RED CELLS,LR     Unit division 00     Status of Unit ISSUED,FINAL     Transfusion Status OK TO TRANSFUSE     Crossmatch Result Compatible     Unit Number [id number]     Blood Component Type RED CELLS,LR     Unit division 00     Status of Unit REL FROM [facility]     Transfusion Status OK TO TRANSFUSE     Crossmatch Result Compatible    PREPARE RBC (CROSSMATCH)      Result Value Range    Order Confirmation ORDER PROCESSED BY BLOOD BANK    PREPARE RBC (CROSSMATCH)      Result Value Range   Order Confirmation ORDER PROCESSED BY BLOOD BANK    A/P: Pt with hx recent GI bleed/colitis, LLE DVT on coumadin as OP, CKD, myasthenia gravis, poor venous access with frequent need for IVF, blood draws/meds/transfusions. Tent plan is for port a cath placement once PT/INR within nl limits, preferably INR of 1.6 or less. Details/risks of procedure d/w pt with her understanding and consent. Will recheck labs in am.

## 2013-01-28 NOTE — Care Management Note (Addendum)
    Page 1 of 2   01/31/2013     12:52:36 PM   CARE MANAGEMENT NOTE 01/31/2013  Patient:  [name], [name]   Account Number:  [id number]  Date Initiated:  01/25/2013  Documentation initiated by:  DAVIS,RHONDA  Subjective/Objective Assessment:   pt with lower gi bld and recent hx of colonic ulcers.     Action/Plan:   lives at home   Anticipated DC Date:  02/01/2013   Anticipated DC Plan:  HOME/SELF CARE  In-house referral  NA      DC Planning Services  CM consult      PAC Choice  NA   Choice offered to / List presented to:  C-1 Patient   DME arranged  NA      DME agency  NA     HH arranged  NA      HH agency  NA   Status of service:  In process, will continue to follow Medicare Important Message given?  NA - LOS <3 / Initial given by admissions (If response is "NO", the following Medicare IM given date fields will be blank) Date Medicare IM given:   Date Additional Medicare IM given:    Discharge Disposition:    Per UR Regulation:  Reviewed for med. necessity/level of care/duration of stay  If discussed at Long Length of Stay Meetings, dates discussed:   01/31/2013    Comments:  01/31/13 Marsela Kuan RN,BSN NCM [id number] ID-CMV COLITIS-GANCICLOVIR.GI-SIGNED OFF.DIARRHEA-IMPROVING-IMMODIUM.[facility] CHOSEN FOR HH KRISTEN AWARE & FOLLOWING.HHPT/OT,RW,3N1,AWAITING FINAL HH/DME ORDERS.  01/28/13 Anyah Swallow MAHABIRRN,BSN NCM [phone] TRANSFER FROM SDU.FAMILY HAS 24HR SUPPORT,HAS RW.NO ANTICIPATED D/C NEEDS.  [id number] Earlene Plater, RN, BSN, [place] [phone] Chart Reviewed for discharge and hospital needs.Spoke with patient concerning cost of her meds.  States that she has gotten patient assistance for the cellcept and is now able to afford her meds. Discharge needs at time of review:  None Review of patient progress due on [id number].

## 2013-01-28 NOTE — Consult Note (Signed)
WOC consult Note Reason for Consult: Stage II pressure ulcer to sacrum.  Present on admission. Patient states it has been red off and on for a week.  Wound type: Pressure ulcer Pressure Ulcer POA: Yes Measurement: 4 cm x 4 cm x 0.2 cm  Wound bed: clean and pink, barrier cream to sacrum Drainage (amount, consistency, odor) minimal, serous Periwound: Intact Dressing procedure/placement/frequency:  Allevyn foam, silicone bordered foam in place.  Turn and reposition every 2 hours to offload pressure.  WOC Nursing team will not follow at this time.  Please re-consult if needed.  Maple Hudson RN BSN [facility] Pager [phone]

## 2013-01-29 ENCOUNTER — Inpatient Hospital Stay (HOSPITAL_COMMUNITY): Payer: Medicare Other

## 2013-01-29 DIAGNOSIS — A0839 Other viral enteritis: Secondary | ICD-10-CM

## 2013-01-29 DIAGNOSIS — B259 Cytomegaloviral disease, unspecified: Principal | ICD-10-CM

## 2013-01-29 DIAGNOSIS — R634 Abnormal weight loss: Secondary | ICD-10-CM | POA: Diagnosis present

## 2013-01-29 LAB — BASIC METABOLIC PANEL
BUN: 11 mg/dL (ref 6–23)
Chloride: 110 mEq/L (ref 96–112)
Creatinine, Ser: 0.84 mg/dL (ref 0.50–1.10)
GFR calc Af Amer: 71 mL/min — ABNORMAL LOW (ref >90)
GFR calc non Af Amer: 61 mL/min — ABNORMAL LOW (ref >90)
Glucose, Bld: 109 mg/dL — ABNORMAL HIGH (ref 70–99)
Potassium: 3.2 mEq/L — ABNORMAL LOW (ref 3.5–5.1)

## 2013-01-29 LAB — PROTIME-INR
INR: 1.52 — ABNORMAL HIGH (ref 0.00–1.49)
Prothrombin Time: 17.9 seconds — ABNORMAL HIGH (ref 11.6–15.2)

## 2013-01-29 LAB — CBC
Hemoglobin: 9.1 g/dL — ABNORMAL LOW (ref 12.0–15.0)
MCH: 28.2 pg (ref 26.0–34.0)
MCHC: 32.6 g/dL (ref 30.0–36.0)
RDW: 16.5 % — ABNORMAL HIGH (ref 11.5–15.5)

## 2013-01-29 MED ORDER — FENTANYL CITRATE 0.05 MG/ML IJ SOLN
INTRAMUSCULAR | Status: AC
  Filled 2013-01-29: qty 4

## 2013-01-29 MED ORDER — MIDAZOLAM HCL 2 MG/2ML IJ SOLN
INTRAMUSCULAR | Status: AC | PRN
  Administered 2013-01-29 (×2): 1 mg via INTRAVENOUS

## 2013-01-29 MED ORDER — FENTANYL CITRATE 0.05 MG/ML IJ SOLN
INTRAMUSCULAR | Status: AC | PRN
  Administered 2013-01-29 (×2): 50 ug via INTRAVENOUS

## 2013-01-29 MED ORDER — INFLUENZA VAC SPLIT QUAD 0.5 ML IM SUSP
0.5000 mL | INTRAMUSCULAR | Status: AC
  Filled 2013-01-29 (×2): qty 0.5

## 2013-01-29 MED ORDER — CEFAZOLIN SODIUM-DEXTROSE 2-3 GM-% IV SOLR
2.0000 g | INTRAVENOUS | Status: AC
  Administered 2013-01-29: 2 g via INTRAVENOUS
  Filled 2013-01-29: qty 50

## 2013-01-29 MED ORDER — MIDAZOLAM HCL 2 MG/2ML IJ SOLN
INTRAMUSCULAR | Status: AC
  Filled 2013-01-29: qty 4

## 2013-01-29 MED ORDER — POTASSIUM CHLORIDE CRYS ER 20 MEQ PO TBCR
40.0000 meq | EXTENDED_RELEASE_TABLET | Freq: Two times a day (BID) | ORAL | Status: AC
  Administered 2013-01-29 (×2): 40 meq via ORAL
  Filled 2013-01-29 (×2): qty 2

## 2013-01-29 NOTE — Progress Notes (Signed)
Patient ID: [name], female   DOB: 08/23/1924, 79 y.o.   MRN: [id number] [facility] Progress Note  Subjective: Pt in xray for porta cath Colon bx -pending  Objective:  Vital signs in last 24 hours: Temp:  [98.2 F (36.8 C)-98.9 F (37.2 C)] 98.2 F (36.8 C) (09/09 4098) Pulse Rate:  [63-74] 63 (09/09 0637) Resp:  [16-20] 16 (09/09 1191) BP: (104-120)/(58-65) 120/59 mmHg (09/09 0637) SpO2:  [100 %] 100 % (09/09 0637) Last BM Date: 01/28/13  Lab Results:  Recent Labs  01/27/13 0250 01/28/13 0400 01/29/13 0425  WBC  --  5.2 6.0  HGB 8.9* 8.9* 9.1*  HCT  --  26.9* 27.9*  PLT  --  200 183   BMET  Recent Labs  01/27/13 1114 01/28/13 0400 01/29/13 0425  NA 135 135 135  K 3.3* 3.8 3.2*  CL 107 110 110  CO2 17* 18* 18*  GLUCOSE 116* 127* 109*  BUN 11 12 11   CREATININE 0.90 0.83 0.84  CALCIUM 8.4 7.9* 7.8*     PT/INR  Recent Labs  01/28/13 1150 01/29/13 0425  LABPROT 24.8* 17.9*  INR 2.33* 1.52*    Assessment / Plan: #1  79 yo female with acute colitis- suspect this may be related to Cellcept - awaiting bx , but no evidence of infectious etiology or IBD by previous BX Started Lialda yesterday  On scheduled Imodium If bx are nonspecific will need to address Cellcept  necessity with her neurologist and whether another med could be substituted  Active Problems:   HYPOTHYROIDISM   Myasthenia gravis without exacerbation   HYPERTENSION   Diarrhea   Ulceration, colon   DVT of leg (deep venous thrombosis)   Lower GI bleed     LOS: 5 days   Amy Esterwood  01/29/2013, 9:10 AM  ________________________________________________________________________  [facility] MD note:  I personally examined the patient, reviewed the data and agree with the assessment and plan described above.  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  Pathologically it resembles IBD (crohn's colitis) or GVHD.  Often, patients  require cessation of cell cept to improve.  She has been on cell cept for a long time, would need input from her neurologist if considering stopping it.   Rob Bunting, MD [facility] Pager [phone]

## 2013-01-29 NOTE — Progress Notes (Signed)
[facility]  Called by Dr. Dierdre Searles of pathology - she thinks colon ulcer biopsies are from CMV since there are inclusion bodies present. Special stains to clarify underway.  Lauren Boop, MD, [facility] [phone] (pager) 01/29/2013 10:00 AM

## 2013-01-29 NOTE — Progress Notes (Signed)
Patient's hemoglobin 9.1. Per MD's orders placed 9/7, patient is to receive 1 unit PRBC. Patient having PAC placed today. Asked MD if patient should receive unit of PRBC after PAC placement and she said to hold off for now because the goal is to keep her hemoglobin greater than 9.  Will pass on to next RN and plan to reassess in the morning.

## 2013-01-29 NOTE — Progress Notes (Addendum)
[facility] PROGRESS NOTE  [name] WUJ:[id number] DOB: June 15, 1924 DOA: 01/24/2013 PCP: Nelwyn Salisbury, MD Brief HPI: [name] is a 79 y.o. female with recent hospitalization for colitis and a colonoscopy showing Colon ulcers, recently diagnosed with DVT on coumadin, Myasthenia gravis on cellcept, came in for recurrence of diarrhea and a bloody bowel movement today. She was found to be heme occult positive and her hemoglobin 7. She also complained of generalized weakness and almost fainted today. She denies abd pain or nausea or vomiting or hematemesis. She is on vantin for recently diag UTI and has completed 8 days of 10 day course. She is referred to medical service for admission and GI consult from [place] requested. During her hospitalization, she received 2 units of prbc transfusion and underwent flexible sigmoidoscopy shows multiple colon ulcers with colitis. Multiple biopsies were taken and sent for pathology. Meanwhile, she went int o atrial fibrillation, probably from hypovolemia. Staff could not get IV line in, we have ordere PICC LINE, but the daughter requested for port a cath placement, in view of her recent multiple admissions for fluids and blood transfusions.   Assessment/Plan:  1. Diarrhea and Lower Gi Bleed:  - initially admitted to step down for active bleeding.  - transfused 2 units of prbc,  -  stable H&H -  IV fluids. Started her on clear liquid diet and advanced as tolerated.  - GI consulted from [place], she underwent sigmoidoscopy and was found to have multiple ulcers and colitis in the sigmoid colon.  - with recent use of antibiotics, c diff ruled out .  Preliminary report of biopsies could be CMV, as inclusion bodies are present. Further stains pending.   2. Hypotension; resolved with FLUIDS.  3. DVT : on coumadin at home,  held it for active bleeding. IR consulted and IVC filter placed on 9/6. 4. Stage 3 CKD: hydration and repeat renal parameters   Showed an improvement.  5. Hyponatremia: probably from dehydration:resolved with fluids.  6. Myasthenia gravis: resume cell cept and prednisone.  7. Hypothyroidism: resume synthroid. TSH on 7/19 is 0.640 8. Recent diagnosis of UTI; completed 8 days of vantin. We do not have urine cultures and she is currently asymptomatic and in view of her diarrhea we will discontinue it.  9. DVT prophylaxis: no anticoagulation in view of the active bleed and no SCD'S for new DVT.  10. Sacral ulcer: as per the patient, started 3 weeks ago, no tenderness. Wound care consulted and recommended allevyn foam, silicone foam, and turn and reposition every 2 hours.  11. Mild metabolic acidosis:improved. Bicarb is 18. 12. Chronic Atrial fibrillation: patient has a h/o chronic atrial fibrillation from notes dating 2008 by Dr Clent Ridges. Last night she went into atrial fib, probably secondary to hypovolemia. She was given a dose of dig. Echocardiogram ordered and metoprolol given. She is asymptomatic. Echo showed good systolic LV EF, grade 2 diastolic dysfunction.   13. Hypokalemia; replete as needed.   Code Status: full code  Family Communication:none at bedside.  Disposition Plan: possible d/c in am.      Consultants:  GI  Radiology  Antibiotics:  none  HPI/Subjective: Diarrhea improved.  Objective: Filed Vitals:   01/29/13 1235  BP: 138/60  Pulse: 78  Temp: 98.3 F (36.8 C)  Resp: 14    Intake/Output Summary (Last 24 hours) at 01/29/13 1320 Last data filed at 01/29/13 1315  Gross per 24 hour  Intake   1860 ml  Output    926 ml  Net    934 ml   Filed Weights   01/24/13 1700 01/26/13 0000 01/27/13 1208  Weight: 78.8 kg (173 lb 11.6 oz) 76.5 kg (168 lb 10.4 oz) 80.4 kg (177 lb 4 oz)    Exam:   General: alert afebrile comfortable.   Cardiovascular: s1s2  Respiratory: CTAB  Abdomen: soft NT nd bs+    Data Reviewed: Basic Metabolic Panel:  Recent Labs Lab 01/25/13 0650 01/26/13 0232  01/27/13 1114 01/28/13 0400 01/29/13 0425  NA 135 134* 135 135 135  K 4.4 4.0 3.3* 3.8 3.2*  CL 110 108 107 110 110  CO2 17* 15* 17* 18* 18*  GLUCOSE 92 108* 116* 127* 109*  BUN 18 13 11 12 11   CREATININE 1.00 0.82 0.90 0.83 0.84  CALCIUM 7.9* 8.0* 8.4 7.9* 7.8*  MG  --   --   --  1.7  --    Liver Function Tests: No results found for this basename: AST, ALT, ALKPHOS, BILITOT, PROT, ALBUMIN,  in the last 168 hours No results found for this basename: LIPASE, AMYLASE,  in the last 168 hours No results found for this basename: AMMONIA,  in the last 168 hours CBC:  Recent Labs Lab 01/24/13 1622  01/26/13 0232 01/26/13 1047 01/26/13 1840 01/27/13 0250 01/28/13 0400 01/29/13 0425  WBC 5.6  --  5.6  --   --   --  5.2 6.0  NEUTROABS 4.6  --   --   --   --   --   --   --   HGB 7.0*  < > 9.2* 8.9* 9.5* 8.9* 8.9* 9.1*  HCT 21.6*  --  28.4*  --   --   --  26.9* 27.9*  MCV 85.0  --  85.3  --   --   --  84.3 86.4  PLT 240  --  218  --   --   --  200 183  < > = values in this interval not displayed. Cardiac Enzymes: No results found for this basename: CKTOTAL, CKMB, CKMBINDEX, TROPONINI,  in the last 168 hours BNP (last 3 results)  Recent Labs  12/08/12 0026  PROBNP 465.5*   CBG: No results found for this basename: GLUCAP,  in the last 168 hours  Recent Results (from the past 240 hour(s))  MRSA PCR SCREENING     Status: None   Collection Time    01/24/13  6:48 PM      Result Value Range Status   MRSA by PCR NEGATIVE  NEGATIVE Final   Comment:            The GeneXpert MRSA Assay (FDA     approved for NASAL specimens     only), is one component of a     comprehensive MRSA colonization     surveillance program. It is not     intended to diagnose MRSA     infection nor to guide or     monitor treatment for     MRSA infections.  CLOSTRIDIUM DIFFICILE BY PCR     Status: None   Collection Time    01/25/13  8:00 AM      Result Value Range Status   C difficile by pcr  NEGATIVE  NEGATIVE Final   Comment: Performed at [facility]     Studies: Ir Fluoro Guide Cv Line Right  01/29/2013   *RADIOLOGY REPORT*  TUNNELED CENTRAL VENOUS CATHETER WITH SUBCUTANEOUS RESERVOIR (PORTACATH) PLACEMENT WITH ULTRASOUND AND  FLUOROSCOPIC  GUIDANCE  Date: 01/29/2013  Clinical History: 79 year old female with a poor peripheral IV access and frequent need for intravenous medications and peripheral blood draws.  Benefits of PICC versus port placement were discussed with the patient and family.  Portacatheter is preferred secondary to perceives long-term need.  Sedation: Moderate (conscious) sedation was administered during this procedure.  A total of twomg Versed and 100mg  Fentanyl were administered intravenously.  The patient's vital signs were monitored continuously by radiology nursing throughout the course of the procedure.  Total sedation time: 25 minutes  Fluoroscopy Time: 18 seconds minutes.  Procedure:  The right neck and chest was prepped with chlorhexidine, and draped in the usual sterile fashion using maximum barrier technique (cap and mask, sterile gown, sterile gloves, large sterile sheet, hand hygiene and cutaneous antiseptic).  Antibiotic prophylaxis was provided with 2g Ancef administered IV one hour prior to skin incision.  Local anesthesia was attained by infiltration with 1% lidocaine with epinephrine.  Ultrasound demonstrated patency of the right internal jugular vein, and this was documented with an image.  Under real-time ultrasound guidance, this vein was accessed with a 21 gauge micropuncture needle and image documentation was performed.  A small dermatotomy was made at the access site with an 11 scalpel.  A 0.018" wire was advanced into the SVC and the access needle exchanged for a 11F micropuncture vascular sheath.  The 0.018" wire was then removed and a 0.035" wire advanced into the IVC.  An appropriate location for the subcutaneous reservoir was selected below the  clavicle and an incision was made through the skin and underlying soft tissues.  The subcutaneous tissues were then dissected using a combination of blunt and sharp surgical technique and a pocket was formed.  A single lumen power injectable portacatheter was then tunneled through the subcutaneous tissues from the pocket to the dermatotomy and the port reservoir placed within the subcutaneous pocket.  The venous access site was then serially dilated and a peel away vascular sheath placed over the wire.  The wire was removed and the port catheter advanced into position under fluoroscopic guidance. The catheter tip is positioned in the superior cavoatrial junction. This was documented with a spot image. The portacatheter was then tested and found to flush and aspirate well.  The port was flushed with saline followed by 100 units/mL heparinized saline.  The pocket was then closed in two layers using first subdermal inverted interrupted absorbable sutures followed by a running subcuticular suture.  The epidermis was then sealed with Dermabond. The dermatotomy at the venous access site was also closed with a single inverted subdermal suture and the epidermis sealed with Dermabond.  Complications:  None.  The patient tolerated the procedure well.  IMPRESSION:  Successful placement of a right IJapproach PowerPort with ultrasound and fluoroscopic guidance.  The catheter is ready for use.  Signed,  Sterling Big, MD Vascular & Interventional Radiologist [facility] Radiology   Original Report Authenticated By: Malachy Moan, M.D.   Ir US Guide Vasc Access Right  01/29/2013   *RADIOLOGY REPORT*  TUNNELED CENTRAL VENOUS CATHETER WITH SUBCUTANEOUS RESERVOIR (PORTACATH) PLACEMENT WITH ULTRASOUND AND FLUOROSCOPIC  GUIDANCE  Date: 01/29/2013  Clinical History: 79 year old female with a poor peripheral IV access and frequent need for intravenous medications and peripheral blood draws.  Benefits of PICC versus port  placement were discussed with the patient and family.  Portacatheter is preferred secondary to perceives long-term need.  Sedation: Moderate (conscious) sedation was administered during this procedure.  A total of twomg Versed and 100mg  Fentanyl were administered intravenously.  The patient's vital signs were monitored continuously by radiology nursing throughout the course of the procedure.  Total sedation time: 25 minutes  Fluoroscopy Time: 18 seconds minutes.  Procedure:  The right neck and chest was prepped with chlorhexidine, and draped in the usual sterile fashion using maximum barrier technique (cap and mask, sterile gown, sterile gloves, large sterile sheet, hand hygiene and cutaneous antiseptic).  Antibiotic prophylaxis was provided with 2g Ancef administered IV one hour prior to skin incision.  Local anesthesia was attained by infiltration with 1% lidocaine with epinephrine.  Ultrasound demonstrated patency of the right internal jugular vein, and this was documented with an image.  Under real-time ultrasound guidance, this vein was accessed with a 21 gauge micropuncture needle and image documentation was performed.  A small dermatotomy was made at the access site with an 11 scalpel.  A 0.018" wire was advanced into the SVC and the access needle exchanged for a 30F micropuncture vascular sheath.  The 0.018" wire was then removed and a 0.035" wire advanced into the IVC.  An appropriate location for the subcutaneous reservoir was selected below the clavicle and an incision was made through the skin and underlying soft tissues.  The subcutaneous tissues were then dissected using a combination of blunt and sharp surgical technique and a pocket was formed.  A single lumen power injectable portacatheter was then tunneled through the subcutaneous tissues from the pocket to the dermatotomy and the port reservoir placed within the subcutaneous pocket.  The venous access site was then serially dilated and a peel away  vascular sheath placed over the wire.  The wire was removed and the port catheter advanced into position under fluoroscopic guidance. The catheter tip is positioned in the superior cavoatrial junction. This was documented with a spot image. The portacatheter was then tested and found to flush and aspirate well.  The port was flushed with saline followed by 100 units/mL heparinized saline.  The pocket was then closed in two layers using first subdermal inverted interrupted absorbable sutures followed by a running subcuticular suture.  The epidermis was then sealed with Dermabond. The dermatotomy at the venous access site was also closed with a single inverted subdermal suture and the epidermis sealed with Dermabond.  Complications:  None.  The patient tolerated the procedure well.  IMPRESSION:  Successful placement of a right IJapproach PowerPort with ultrasound and fluoroscopic guidance.  The catheter is ready for use.  Signed,  Sterling Big, MD Vascular & Interventional Radiologist [facility] Radiology   Original Report Authenticated By: Malachy Moan, M.D.    Scheduled Meds: . buPROPion  150 mg Oral Daily  . digoxin  0.125 mg Oral Daily  . feeding supplement  30 mL Oral Daily  . feeding supplement  1 Container Oral BID BM  . fentaNYL      . levothyroxine  125 mcg Oral QAC breakfast  . loperamide  2 mg Oral BID  . mesalamine  4.8 g Oral Q breakfast  . midazolam      . multivitamin  5 mL Oral Daily  . mycophenolate  1,000 mg Oral BID  . pantoprazole (PROTONIX) IV  40 mg Intravenous Q24H  . potassium chloride  40 mEq Oral BID  . predniSONE  10 mg Oral Q breakfast  . sodium chloride  3 mL Intravenous Q12H   Continuous Infusions: . sodium chloride 100 mL/hr at 01/28/13 1807    Active Problems:  HYPOTHYROIDISM   Myasthenia gravis without exacerbation   HYPERTENSION   Diarrhea   Ulceration, colon   DVT of leg (deep venous thrombosis)   Lower GI bleed    Time spent: 30  miin    [name]  [facility] Pager [phone]. If 7PM-7AM, please contact night-coverage at [email or web address], password [facility] 01/29/2013, 1:20 PM  LOS: 5 days         Addendum; spoke to the patient and the daughter [name], reported pt used to see Dr Thad Ranger at [facility], but the Physician has relocated and they did not see another physician yet. As per the daughter she does not do well, without the cell cept and did not want [place] to stop the cell cept at this time.

## 2013-01-29 NOTE — Progress Notes (Signed)
Patient ID: [name], female   DOB: [birth year], 79 y.o.   MRN: [id number]         [facility] for Infectious Disease    Date of Admission:  01/24/2013          Reason for Consult: Chronic diarrhea and colonic ulcers    Referring Physician: Dr. Stan Head  Principal Problem:   Ulceration, colon Active Problems:   Diarrhea   Nausea alone   Loss of appetite   Unintentional weight loss   Myasthenia gravis without exacerbation   HYPOTHYROIDISM   HYPERTENSION   DVT of leg (deep venous thrombosis)   Lower GI bleed   . buPROPion  150 mg Oral Daily  . digoxin  0.125 mg Oral Daily  . feeding supplement  30 mL Oral Daily  . feeding supplement  1 Container Oral BID BM  . fentaNYL      . levothyroxine  125 mcg Oral QAC breakfast  . loperamide  2 mg Oral BID  . mesalamine  4.8 g Oral Q breakfast  . midazolam      . multivitamin  5 mL Oral Daily  . mycophenolate  1,000 mg Oral BID  . pantoprazole (PROTONIX) IV  40 mg Intravenous Q24H  . potassium chloride  40 mEq Oral BID  . predniSONE  10 mg Oral Q breakfast  . sodium chloride  3 mL Intravenous Q12H    Recommendations: 1. Review special stains and colonic biopsy 2. Obtain serum for CMV PCR 3. Consider induction therapy with ganciclovir 4. 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 I will await review of special stains in order serum for CMV PCR to see if she has viremia. I will consider induction therapy with oral valganciclovir. His also worth considering holding mycophenolate temporarily if further studies seemed to confirm active CMV colitis. I will followup tomorrow.    HPI: [name] is a 79 y.o. female who has been on chronic mycophenolate for myasthenia gravis. Several months ago she began to have intermittent watery  diarrhea, anorexia and unintentional weight loss. She was hospitalized in July and underwent a colonoscopy which showed colonic ulcers. Biopsies revealed nonspecific acute colitis. C. difficile PCR was negative. She was started on prednisone with some partial improvement. When seen back in the office she was still symptomatic so Lialda was started. She developed some nausea and felt worse so the prednisone was restarted on August 22. Shortly thereafter she developed a left leg DVT and was started on Coumadin. She had an episode of bloody diarrhea and was readmitted on September 4. On September 6 she underwent flexible sigmoidoscopy which, again, showed mucosal ulcerations. I have a verbal report of inclusions being seen on the biopsy is suggestive of cytomegalovirus infection.   Review of Systems: Constitutional: positive for anorexia, fatigue and weight loss, negative for chills, fevers and sweats Eyes: negative Ears, nose, mouth, throat, and face: negative Respiratory: negative Cardiovascular: negative Gastrointestinal: positive for diarrhea and nausea, negative for abdominal pain, constipation, dysphagia, odynophagia and vomiting Genitourinary:negative  Past Medical History  Diagnosis Date  . Anemia   . Hyperlipemia   . Hypertension   . Thyroid disease     hypothyroidism  . Anxiety   . Paroxysmal atrial fibrillation   . Vitamin B 12 deficiency   . CHF (congestive heart failure)   . TIA (transient ischemic  attack)     4-09  . Microscopic hematuria     benign microscopic hematuria, worked up b Dr Alexis Frock  . Gall stone     porcelain gall bladder with single stone  per CT 02-2009 Dr Birdie Sons  . Osteoporosis     DEXA on 04-30-10  . Myasthenia gravis     sees Dr. Terrace Arabia   . Ulcerative colitis   . DVT of leg (deep venous thrombosis)     left leg     History  Substance Use Topics  . Smoking status: Never Smoker   . Smokeless tobacco: Never Used  . Alcohol Use: No     Family History  Problem Relation Age of Onset  . Heart disease Father    Allergies  Allergen Reactions  . Furosemide Rash    REACTION: unspecified  . Ivp Dye [Iodinated Diagnostic Agents] Rash  . Sulfamethoxazole Rash    REACTION: unspecified    OBJECTIVE: Blood pressure 138/60, pulse 78, temperature 98.3 F (36.8 C), temperature source Oral, resp. rate 14, height 5\' 5"  (1.651 m), weight 80.4 kg (177 lb 4 oz), SpO2 98.00%. General: She is in good spirits and in no distress Skin: No rash Oral: No oropharyngeal lesions Lymph nodes: No palpable adenopathy Lungs: Clear  Cor: Regular S1 and S2 no murmurs Abdomen: Soft and nontender with no palpable masses Mood and affect: Normal  Microbiology: Recent Results (from the past 240 hour(s))  MRSA PCR SCREENING     Status: None   Collection Time    01/24/13  6:48 PM      Result Value Range Status   MRSA by PCR NEGATIVE  NEGATIVE Final   Comment:            The GeneXpert MRSA Assay (FDA     approved for NASAL specimens     only), is one component of a     comprehensive MRSA colonization     surveillance program. It is not     intended to diagnose MRSA     infection nor to guide or     monitor treatment for     MRSA infections.  CLOSTRIDIUM DIFFICILE BY PCR     Status: None   Collection Time    01/25/13  8:00 AM      Result Value Range Status   C difficile by pcr NEGATIVE  NEGATIVE Final   Comment: Performed at [facility]    Cliffton Asters, MD [facility] for Infectious Disease [facility] [phone] pager   [phone] cell 01/29/2013, 5:30 PM

## 2013-01-29 NOTE — Evaluation (Signed)
Physical Therapy Evaluation Patient Details Name: [name] MRN: [id number] DOB: February 23, 1925 Today's Date: 01/29/2013 Time: 7829-5621 PT Time Calculation (min): 17 min  PT Assessment / Plan / Recommendation History of Present Illness  [name] is a 79 y.o. female with recent hospitalization for colitis and a colonoscopy showing  Colon ulcers, recently diagnosed with DVT on coumadin, Myasthenia gravis on cellcept, came in for recurrence of diarrhea and a bloody bowel movement today. She was found to be heme occult positive and her hemoglobin 7. She also complained of generalized weakness and almost fainted on day of admission. She denies abd pain or nausea or vomiting or hematemesis. She is on vantin for recently diag UTI and has completed 8 days of 10 day course.Pt with porta cath placement on 01/29/13   Clinical Impression  Pt presents with generalized deconditioning a decreased functional mobility.  She instructed in doing frequent short bouts of activity at home with gradual progression guided by HHPT   PT Assessment  Patient needs continued PT services    Follow Up Recommendations  Home health PT    Does the patient have the potential to tolerate intense rehabilitation      Barriers to Discharge        Equipment Recommendations  None recommended by PT    Recommendations for Other Services     Frequency Min 3X/week    Precautions / Restrictions Precautions Precautions: Fall Precaution Comments: generalized weakness   Pertinent Vitals/Pain No c/o pain; pt careful of portacath site     Mobility  Bed Mobility Bed Mobility: Supine to Sit;Sit to Supine Supine to Sit: 4: Min assist Sit to Supine: 4: Min assist Details for Bed Mobility Assistance: needs assis to lift upper body into sitting and to bring legs back up onto bed when lying back down Transfers Transfers: Sit to Stand;Stand to Sit Sit to Stand: 4: Min assist;From bed;With upper extremity assist Stand to  Sit: 4: Min assist;With upper extremity assist Details for Transfer Assistance: needs cues to use arms to push up Ambulation/Gait Ambulation/Gait Assistance: Not tested (comment) Ambulation/Gait Assistance Details: pt feeling weak after port cath procedure today, so ambulation deferred    Exercises General Exercises - Lower Extremity Ankle Circles/Pumps: AROM;Both;10 reps;Supine Quad Sets: AROM;Both;5 reps;Supine Gluteal Sets: AROM;Both;5 reps;Supine Hip ABduction/ADduction: AAROM;Both;5 reps;Supine Hip Flexion/Marching: AROM;Both;5 reps;Supine Other Exercises Other Exercises: bilateral hip to hip in supine to activate core muscles   PT Diagnosis: Generalized weakness;Difficulty walking  PT Problem List: Decreased strength;Decreased activity tolerance;Decreased mobility PT Treatment Interventions: Gait training;Functional mobility training;Therapeutic activities;Therapeutic exercise;Balance training     PT Goals(Current goals can be found in the care plan section) Acute Rehab PT Goals Patient Stated Goal: to return home and walk with a walker PT Goal Formulation: With patient Time For Goal Achievement: 02/12/13 Potential to Achieve Goals: Good  Visit Information  Last PT Received On: 01/29/13 History of Present Illness: [name] is a 78 y.o. female with recent hospitalization for colitis and a colonoscopy showing  Colon ulcers, recently diagnosed with DVT on coumadin, Myasthenia gravis on cellcept, came in for recurrence of diarrhea and a bloody bowel movement today. She was found to be heme occult positive and her hemoglobin 7. She also complained of generalized weakness and almost fainted on day of admission. She denies abd pain or nausea or vomiting or hematemesis. She is on vantin for recently diag UTI and has completed 8 days of 10 day course.Pt with porta cath placement on 01/29/13  Prior Functioning  Home Living Family/patient expects to be discharged to::  Private residence Living Arrangements: Children Available Help at Discharge: Family;Available 24 hours/day Home Access: Level entry Home Layout: Two level;Able to live on main level with bedroom/bathroom Home Equipment: Dan Humphreys - 4 wheels Additional Comments: lives with daughter and SIL, SIL is retired and available to assist as needed Prior Function Level of Independence: Independent with assistive device(s) Communication Communication: No difficulties    Cognition       Extremity/Trunk Assessment Lower Extremity Assessment Lower Extremity Assessment: Generalized weakness Cervical / Trunk Assessment Cervical / Trunk Assessment: Other exceptions Cervical / Trunk Exceptions: generalized weakness and muscle atrophy   Balance Balance Balance Assessed: Yes Static Sitting Balance Static Sitting - Balance Support: No upper extremity supported;Feet supported Static Sitting - Level of Assistance: 7: Independent Static Standing Balance Static Standing - Balance Support: Bilateral upper extremity supported Static Standing - Level of Assistance: 5: Stand by assistance Static Standing - Comment/# of Minutes: pt able to stand for 15 seconds, then fatigued  End of Session PT - End of Session Activity Tolerance: Patient limited by fatigue Patient left: in bed  GP    Rosey Bath K. [place], [place] [phone] 01/29/2013, 2:31 PM

## 2013-01-29 NOTE — Progress Notes (Signed)
OT Cancellation Note  Patient Details Name: Laketa Sandoz MRN: [id number] DOB: 1925-05-10   Cancelled Treatment:    Reason Eval/Treat Not Completed: Other (comment)  Pt fatiqued.  Worked with PT earlier and had portacath placed today. Will check back tomorrow.  Ingvald Theisen 01/29/2013, 3:10 PM Marica Otter, OTR/L [phone] 01/29/2013

## 2013-01-29 NOTE — Procedures (Signed)
Interventional Radiology Procedure Note  Procedure: Placement of a right IJ approach single lumen PowerPort.  Tip is positioned at the superior cavoatrial junction and catheter is ready for immediate use.  Complications: No immediate Recommendations:  - Ok to shower tomorrow - Do not submerge for 7 days - Routine line care   Signed,  Hatsuko Bizzarro K. Ottie Neglia, MD Vascular & Interventional Radiologist [facility]  

## 2013-01-30 DIAGNOSIS — G7000 Myasthenia gravis without (acute) exacerbation: Secondary | ICD-10-CM

## 2013-01-30 DIAGNOSIS — I4891 Unspecified atrial fibrillation: Secondary | ICD-10-CM | POA: Diagnosis present

## 2013-01-30 MED ORDER — SODIUM CHLORIDE 0.9 % IJ SOLN
10.0000 mL | INTRAMUSCULAR | Status: DC | PRN
  Administered 2013-01-30 – 2013-02-04 (×4): 10 mL

## 2013-01-30 MED ORDER — VALGANCICLOVIR HCL 450 MG PO TABS
900.0000 mg | ORAL_TABLET | Freq: Two times a day (BID) | ORAL | Status: DC
  Administered 2013-01-30 – 2013-02-04 (×10): 900 mg via ORAL
  Filled 2013-01-30 (×12): qty 2

## 2013-01-30 MED ORDER — POTASSIUM CHLORIDE CRYS ER 20 MEQ PO TBCR
40.0000 meq | EXTENDED_RELEASE_TABLET | Freq: Once | ORAL | Status: AC
  Administered 2013-01-30: 40 meq via ORAL
  Filled 2013-01-30: qty 2

## 2013-01-30 NOTE — Progress Notes (Signed)
Quick Note:  CMV colitis ID on the case at hospital  ______

## 2013-01-30 NOTE — Progress Notes (Signed)
[facility] PROGRESS NOTE  [name]:[id number] DOB: 04-12-1925 DOA: 01/24/2013 PCP: Nelwyn Salisbury, MD  Assessment/Plan: Chronic Diarrhea and Colonic Ulcers -Stains from sigmoidoscopy c/w CMV. -ID following to help determine treatment course. -Would prefer to avoid stopping her cellcept if at all possible giving issues with swallowing and difficulty breathing when she was off of it years back.  Hypotension -Resolved with IVF.  DVT -Coumadin discontinued and IVC filter placed.  Myasthenia Gravis Continue cell cept and prednisone.  Code Status: Full Code   Family Communication: Daughter at bedside updated on plan of care.  Disposition Plan: Home when medically stable   Consultants:  GI  ID   Antibiotics:  None  Subjective: Still with diarrhea. Cooperative and pleasant.  Objective: Filed Vitals:   01/29/13 1235 01/29/13 2045 01/30/13 0450 01/30/13 1440  BP: 138/60 136/57 112/55 110/56  Pulse: 78 75 69 76  Temp: 98.3 F (36.8 C) 99.2 F (37.3 C) 98.3 F (36.8 C) 98.7 F (37.1 C)  TempSrc: Oral Oral Oral Oral  Resp: 14 14 14 16   Height:      Weight:      SpO2: 98% 99% 98% 99%    Intake/Output Summary (Last 24 hours) at 01/30/13 1457 Last data filed at 01/30/13 1445  Gross per 24 hour  Intake 3206.67 ml  Output    350 ml  Net 2856.67 ml   Filed Weights   01/24/13 1700 01/26/13 0000 01/27/13 1208  Weight: 78.8 kg (173 lb 11.6 oz) 76.5 kg (168 lb 10.4 oz) 80.4 kg (177 lb 4 oz)    Exam:   General:  AA OX3, NAD  Cardiovascular: RRR, no M/R?G  Respiratory: CTA B  Abdomen: S/NT/ND/+BS  Extremities: no C/C/E   Neurologic:  Non-focal  Data Reviewed: Basic Metabolic Panel:  Recent Labs Lab 01/25/13 0650 01/26/13 0232 01/27/13 1114 01/28/13 0400 01/29/13 0425  NA 135 134* 135 135 135  K 4.4 4.0 3.3* 3.8 3.2*  CL 110 108 107 110 110  CO2 17* 15* 17* 18* 18*  GLUCOSE 92 108* 116* 127* 109*  BUN 18 13 11 12 11   CREATININE  1.00 0.82 0.90 0.83 0.84  CALCIUM 7.9* 8.0* 8.4 7.9* 7.8*  MG  --   --   --  1.7  --    Liver Function Tests: No results found for this basename: AST, ALT, ALKPHOS, BILITOT, PROT, ALBUMIN,  in the last 168 hours No results found for this basename: LIPASE, AMYLASE,  in the last 168 hours No results found for this basename: AMMONIA,  in the last 168 hours CBC:  Recent Labs Lab 01/24/13 1622  01/26/13 0232 01/26/13 1047 01/26/13 1840 01/27/13 0250 01/28/13 0400 01/29/13 0425  WBC 5.6  --  5.6  --   --   --  5.2 6.0  NEUTROABS 4.6  --   --   --   --   --   --   --   HGB 7.0*  < > 9.2* 8.9* 9.5* 8.9* 8.9* 9.1*  HCT 21.6*  --  28.4*  --   --   --  26.9* 27.9*  MCV 85.0  --  85.3  --   --   --  84.3 86.4  PLT 240  --  218  --   --   --  200 183  < > = values in this interval not displayed. Cardiac Enzymes: No results found for this basename: CKTOTAL, CKMB, CKMBINDEX, TROPONINI,  in the last 168 hours BNP (  last 3 results)  Recent Labs  12/08/12 0026  PROBNP 465.5*   CBG: No results found for this basename: GLUCAP,  in the last 168 hours  Recent Results (from the past 240 hour(s))  MRSA PCR SCREENING     Status: None   Collection Time    01/24/13  6:48 PM      Result Value Range Status   MRSA by PCR NEGATIVE  NEGATIVE Final   Comment:            The GeneXpert MRSA Assay (FDA     approved for NASAL specimens     only), is one component of a     comprehensive MRSA colonization     surveillance program. It is not     intended to diagnose MRSA     infection nor to guide or     monitor treatment for     MRSA infections.  CLOSTRIDIUM DIFFICILE BY PCR     Status: None   Collection Time    01/25/13  8:00 AM      Result Value Range Status   C difficile by pcr NEGATIVE  NEGATIVE Final   Comment: Performed at [facility]     Studies: Ir Fluoro Guide Cv Line Right  01/29/2013   *RADIOLOGY REPORT*  TUNNELED CENTRAL VENOUS CATHETER WITH SUBCUTANEOUS RESERVOIR  (PORTACATH) PLACEMENT WITH ULTRASOUND AND FLUOROSCOPIC  GUIDANCE  Date: 01/29/2013  Clinical History: 79 year old female with a poor peripheral IV access and frequent need for intravenous medications and peripheral blood draws.  Benefits of PICC versus port placement were discussed with the patient and family.  Portacatheter is preferred secondary to perceives long-term need.  Sedation: Moderate (conscious) sedation was administered during this procedure.  A total of twomg Versed and 100mg  Fentanyl were administered intravenously.  The patient's vital signs were monitored continuously by radiology nursing throughout the course of the procedure.  Total sedation time: 25 minutes  Fluoroscopy Time: 18 seconds minutes.  Procedure:  The right neck and chest was prepped with chlorhexidine, and draped in the usual sterile fashion using maximum barrier technique (cap and mask, sterile gown, sterile gloves, large sterile sheet, hand hygiene and cutaneous antiseptic).  Antibiotic prophylaxis was provided with 2g Ancef administered IV one hour prior to skin incision.  Local anesthesia was attained by infiltration with 1% lidocaine with epinephrine.  Ultrasound demonstrated patency of the right internal jugular vein, and this was documented with an image.  Under real-time ultrasound guidance, this vein was accessed with a 21 gauge micropuncture needle and image documentation was performed.  A small dermatotomy was made at the access site with an 11 scalpel.  A 0.018" wire was advanced into the SVC and the access needle exchanged for a 63F micropuncture vascular sheath.  The 0.018" wire was then removed and a 0.035" wire advanced into the IVC.  An appropriate location for the subcutaneous reservoir was selected below the clavicle and an incision was made through the skin and underlying soft tissues.  The subcutaneous tissues were then dissected using a combination of blunt and sharp surgical technique and a pocket was formed.  A  single lumen power injectable portacatheter was then tunneled through the subcutaneous tissues from the pocket to the dermatotomy and the port reservoir placed within the subcutaneous pocket.  The venous access site was then serially dilated and a peel away vascular sheath placed over the wire.  The wire was removed and the port catheter advanced into position under fluoroscopic  guidance. The catheter tip is positioned in the superior cavoatrial junction. This was documented with a spot image. The portacatheter was then tested and found to flush and aspirate well.  The port was flushed with saline followed by 100 units/mL heparinized saline.  The pocket was then closed in two layers using first subdermal inverted interrupted absorbable sutures followed by a running subcuticular suture.  The epidermis was then sealed with Dermabond. The dermatotomy at the venous access site was also closed with a single inverted subdermal suture and the epidermis sealed with Dermabond.  Complications:  None.  The patient tolerated the procedure well.  IMPRESSION:  Successful placement of a right IJapproach PowerPort with ultrasound and fluoroscopic guidance.  The catheter is ready for use.  Signed,  Sterling Big, MD Vascular & Interventional Radiologist [facility]   Original Report Authenticated By: Malachy Moan, M.D.   Ir US Guide Vasc Access Right  01/29/2013   *RADIOLOGY REPORT*  TUNNELED CENTRAL VENOUS CATHETER WITH SUBCUTANEOUS RESERVOIR (PORTACATH) PLACEMENT WITH ULTRASOUND AND FLUOROSCOPIC  GUIDANCE  Date: 01/29/2013  Clinical History: 79 year old female with a poor peripheral IV access and frequent need for intravenous medications and peripheral blood draws.  Benefits of PICC versus port placement were discussed with the patient and family.  Portacatheter is preferred secondary to perceives long-term need.  Sedation: Moderate (conscious) sedation was administered during this procedure.  A total of twomg  Versed and 100mg  Fentanyl were administered intravenously.  The patient's vital signs were monitored continuously by radiology nursing throughout the course of the procedure.  Total sedation time: 25 minutes  Fluoroscopy Time: 18 seconds minutes.  Procedure:  The right neck and chest was prepped with chlorhexidine, and draped in the usual sterile fashion using maximum barrier technique (cap and mask, sterile gown, sterile gloves, large sterile sheet, hand hygiene and cutaneous antiseptic).  Antibiotic prophylaxis was provided with 2g Ancef administered IV one hour prior to skin incision.  Local anesthesia was attained by infiltration with 1% lidocaine with epinephrine.  Ultrasound demonstrated patency of the right internal jugular vein, and this was documented with an image.  Under real-time ultrasound guidance, this vein was accessed with a 21 gauge micropuncture needle and image documentation was performed.  A small dermatotomy was made at the access site with an 11 scalpel.  A 0.018" wire was advanced into the SVC and the access needle exchanged for a 54F micropuncture vascular sheath.  The 0.018" wire was then removed and a 0.035" wire advanced into the IVC.  An appropriate location for the subcutaneous reservoir was selected below the clavicle and an incision was made through the skin and underlying soft tissues.  The subcutaneous tissues were then dissected using a combination of blunt and sharp surgical technique and a pocket was formed.  A single lumen power injectable portacatheter was then tunneled through the subcutaneous tissues from the pocket to the dermatotomy and the port reservoir placed within the subcutaneous pocket.  The venous access site was then serially dilated and a peel away vascular sheath placed over the wire.  The wire was removed and the port catheter advanced into position under fluoroscopic guidance. The catheter tip is positioned in the superior cavoatrial junction. This was  documented with a spot image. The portacatheter was then tested and found to flush and aspirate well.  The port was flushed with saline followed by 100 units/mL heparinized saline.  The pocket was then closed in two layers using first subdermal inverted interrupted absorbable sutures followed  by a running subcuticular suture.  The epidermis was then sealed with Dermabond. The dermatotomy at the venous access site was also closed with a single inverted subdermal suture and the epidermis sealed with Dermabond.  Complications:  None.  The patient tolerated the procedure well.  IMPRESSION:  Successful placement of a right IJapproach PowerPort with ultrasound and fluoroscopic guidance.  The catheter is ready for use.  Signed,  Sterling Big, MD Vascular & Interventional Radiologist [facility]   Original Report Authenticated By: Malachy Moan, M.D.    Scheduled Meds: . buPROPion  150 mg Oral Daily  . digoxin  0.125 mg Oral Daily  . feeding supplement  30 mL Oral Daily  . feeding supplement  1 Container Oral BID BM  . influenza vac split quadrivalent PF  0.5 mL Intramuscular Tomorrow-1000  . levothyroxine  125 mcg Oral QAC breakfast  . loperamide  2 mg Oral BID  . multivitamin  5 mL Oral Daily  . mycophenolate  1,000 mg Oral BID  . pantoprazole (PROTONIX) IV  40 mg Intravenous Q24H  . predniSONE  10 mg Oral Q breakfast  . sodium chloride  3 mL Intravenous Q12H   Continuous Infusions: . sodium chloride 100 mL/hr at 01/30/13 1610    Principal Problem:   Ulceration, colon Active Problems:   HYPOTHYROIDISM   Myasthenia gravis without exacerbation   HYPERTENSION   Diarrhea   Nausea alone   Loss of appetite   DVT of leg (deep venous thrombosis)   Lower GI bleed   Unintentional weight loss    Time spent: 35 minutes.    [name]  [facility] Pager [phone]  If 7PM-7AM, please contact night-coverage at [email or web address], password [facility] 01/30/2013, 2:57  PM  LOS: 6 days

## 2013-01-30 NOTE — Evaluation (Signed)
Occupational Therapy Evaluation Patient Details Name: [name] MRN: [id number] DOB: Aug 04, 1924 Today's Date: 01/30/2013 Time: 1191-4782 OT Time Calculation (min): 14 min  OT Assessment / Plan / Recommendation History of present illness [name] is a 79 y.o. female with recent hospitalization for colitis and a colonoscopy showing  Colon ulcers, recently diagnosed with DVT on coumadin, Myasthenia gravis on cellcept, came in for recurrence of diarrhea and a bloody bowel movement today. She was found to be heme occult positive and her hemoglobin 7. She also complained of generalized weakness and almost fainted on day of admission. She denies abd pain or nausea or vomiting or hematemesis. She is on vantin for recently diag UTI and has completed 8 days of 10 day course.Pt with porta cath placement on 01/29/13    Clinical Impression   Pt presents to OT with decreased I with ADL activity and will benefit from skilled OT to increase I with ADL activity and return to PLOF    OT Assessment  Patient needs continued OT Services    Follow Up Recommendations  Home health OT;Supervision/Assistance - 24 hour       Equipment Recommendations  3 in 1 bedside comode       Frequency  Min 2X/week    Precautions / Restrictions Precautions Precautions: Fall Precaution Comments: generalized weakness Restrictions Weight Bearing Restrictions: No       ADL  Grooming: Performed;Wash/dry hands;Wash/dry face Where Assessed - Grooming: Unsupported sitting Upper Body Dressing: Simulated;Min guard Where Assessed - Upper Body Dressing: Unsupported sitting Lower Body Dressing: Simulated;Maximal assistance Where Assessed - Lower Body Dressing: Supported sit to stand Toilet Transfer: Performed;Moderate assistance Toilet Transfer Method: Sit to stand;Other (comment) (bed to chair) Toileting - Clothing Manipulation and Hygiene: Simulated;Moderate assistance Where Assessed - Toileting Clothing  Manipulation and Hygiene: Standing ADL Comments: son in law can help pt with ADL activity as needed    OT Diagnosis: Generalized weakness  OT Problem List: Decreased strength;Decreased activity tolerance OT Treatment Interventions: Self-care/ADL training;Patient/family education   OT Goals(Current goals can be found in the care plan section) Acute Rehab OT Goals Patient Stated Goal: be able to take care of my self OT Goal Formulation: With patient Time For Goal Achievement: 02/13/13 Potential to Achieve Goals: Good ADL Goals Pt Will Perform Grooming: with supervision;standing Pt Will Perform Upper Body Dressing: with supervision;sitting Pt Will Perform Lower Body Dressing: with supervision;sit to/from stand Pt Will Transfer to Toilet: with supervision;regular height toilet;ambulating Pt Will Perform Toileting - Clothing Manipulation and hygiene: with supervision;sit to/from stand Pt Will Perform Tub/Shower Transfer: with supervision;ambulating;shower seat  Visit Information  Last OT Received On: 01/30/13 History of Present Illness: [name] is a 79 y.o. female with recent hospitalization for colitis and a colonoscopy showing  Colon ulcers, recently diagnosed with DVT on coumadin, Myasthenia gravis on cellcept, came in for recurrence of diarrhea and a bloody bowel movement today. She was found to be heme occult positive and her hemoglobin 7. She also complained of generalized weakness and almost fainted on day of admission. She denies abd pain or nausea or vomiting or hematemesis. She is on vantin for recently diag UTI and has completed 8 days of 10 day course.Pt with porta cath placement on 01/29/13        Prior Functioning     Home Living Family/patient expects to be discharged to:: Private residence Living Arrangements: Children Available Help at Discharge: Family;Available 24 hours/day Home Access: Level entry Home Layout: Two level;Able to live on main  level with  bedroom/bathroom Home Equipment: Walker - 4 wheels Additional Comments: lives with daughter and SIL, SIL is retired and available to assist as needed Prior Function Level of Independence: Independent with assistive device(s) Communication Communication: No difficulties         Vision/Perception Vision - History Baseline Vision: Wears glasses all the time Patient Visual Report: No change from baseline   Cognition  Cognition Arousal/Alertness: Awake/alert Behavior During Therapy: WFL for tasks assessed/performed Overall Cognitive Status: Within Functional Limits for tasks assessed    Extremity/Trunk Assessment Upper Extremity Assessment Upper Extremity Assessment: Generalized weakness     Mobility Bed Mobility Bed Mobility: Supine to Sit Supine to Sit: 4: Min assist Transfers Transfers: Sit to Stand;Stand to Sit Sit to Stand: 3: Mod assist;From bed;With upper extremity assist Stand to Sit: 3: Mod assist;To chair/3-in-1;With upper extremity assist Details for Transfer Assistance: pt did need mod A with initial sit to stand with OT this visit           End of Session OT - End of Session Activity Tolerance: Patient tolerated treatment well Patient left: in chair (on  3 n 1 with call bell.  RN aware)  GO     Alba Cory 01/30/2013, 11:56 AM

## 2013-01-30 NOTE — Progress Notes (Signed)
Patient ID: [name], female   DOB: [birth year], 79 y.o.   MRN: [id number]         [name]    Date of Admission:  01/24/2013     Subjective: She is still having some diarrhea but feels a little bit better. She believes the Imodium is helping. She is eager to go home. She does not like the heart healthy diet she is being fed here.  Objective: Temp:  [98.3 F (36.8 C)-99.2 F (37.3 C)] 98.7 F (37.1 C) (09/10 1440) Pulse Rate:  [69-76] 76 (09/10 1440) Resp:  [14-16] 16 (09/10 1440) BP: (110-136)/(55-57) 110/56 mmHg (09/10 1440) SpO2:  [98 %-99 %] 99 % (09/10 1440)  General: She is alert and in no distress Abdomen: Soft and nontender  Lab Results Lab Results  Component Value Date   WBC 6.0 01/29/2013   HGB 9.1* 01/29/2013   HCT 27.9* 01/29/2013   MCV 86.4 01/29/2013   PLT 183 01/29/2013    Lab Results  Component Value Date   CREATININE 0.84 01/29/2013   BUN 11 01/29/2013   NA 135 01/29/2013   K 3.2* 01/29/2013   CL 110 01/29/2013   CO2 18* 01/29/2013    Colon, biopsy, left; pt. is immunosuppressed, r/o infection - ACUTE COLITIS WITH ULCERATION. - POSITIVE FOR CYTOMEGALOVIRUS STAIN  Microscopic Comment The biopsies are inflamed with acute ulcer. Virocytopathic changes are identified. Immunohistochemical stains were performed and the sections are positive for cytomegalovirus, negative for HSV-I and II with appropriate controls. There is no evidence of atypia or malignancy. Clinical correlation is highly recommended.   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 I talked to [name] and her family about treatment options. She has relatively mild Marks and I would opt for oral valganciclovir over IV ganciclovir. Oral valganciclovir has good bioavailability and will allow her avoid the potential inconvenience and complications of IV  therapy. I would recommend a three-week induction course of 900 mg twice daily with careful monitoring of her response and for possible bone marrow suppression. After that I would reduce the dose to 900 mg once daily and monitor symptoms. If her serum CMV PCR is positive we can also use that for followup testing to assess response to therapy.  I also emphasized the importance of trying to reduce her immunosuppression to improve her chance of a durable response to therapy. I would suggest stopping prednisone now. She is reluctant to stop mycophenolate. I suggested that she be seen by her neurologist shortly after discharge to help with that discussion.  Plan: 1. She is agreeable with starting oral valganciclovir tonight 2. I recommend stopping prednisone 3. I recommend neurology evaluation as an outpatient to discuss the potential risks and benefits of stopping the mycophenolate transiently during therapy for CMV 4. I will followup tomorrow  Lauren Asters, MD [facility] [phone] pager   [phone] cell 01/30/2013, 4:38 PM

## 2013-01-30 NOTE — Progress Notes (Signed)
[facility] Progress Note    Since last GI note: Diarrhea is really no better since being admitted several days ago.  CMV suggested on biopsies, ID input greatly appreciated  Objective: Vital signs in last 24 hours: Temp:  [97.4 F (36.3 C)-99.2 F (37.3 C)] 98.3 F (36.8 C) (09/10 0450) Pulse Rate:  [57-78] 69 (09/10 0450) Resp:  [13-16] 14 (09/10 0450) BP: (96-144)/(51-64) 112/55 mmHg (09/10 0450) SpO2:  [98 %-100 %] 98 % (09/10 0450) Last BM Date: 01/29/13 General: alert and oriented times 3 Heart: regular rate and rythm Abdomen: soft, non-tender, non-distended, normal bowel sounds   Lab Results:  Recent Labs  01/28/13 0400 01/29/13 0425  WBC 5.2 6.0  HGB 8.9* 9.1*  PLT 200 183  MCV 84.3 86.4    Recent Labs  01/27/13 1114 01/28/13 0400 01/29/13 0425  NA 135 135 135  K 3.3* 3.8 3.2*  CL 107 110 110  CO2 17* 18* 18*  GLUCOSE 116* 127* 109*  BUN 11 12 11   CREATININE 0.90 0.83 0.84  CALCIUM 8.4 7.9* 7.8*   No results found for this basename: PROT, ALBUMIN, AST, ALT, ALKPHOS, BILITOT, BILIDIR, IBILI,  in the last 72 hours  Recent Labs  01/28/13 1150 01/29/13 0425  INR 2.33* 1.52*     Studies/Results: Ir Fluoro Guide Cv Line Right  01/29/2013   *RADIOLOGY REPORT*  TUNNELED CENTRAL VENOUS CATHETER WITH SUBCUTANEOUS RESERVOIR (PORTACATH) PLACEMENT WITH ULTRASOUND AND FLUOROSCOPIC  GUIDANCE  Date: 01/29/2013  Clinical History: 79 year old female with a poor peripheral IV access and frequent need for intravenous medications and peripheral blood draws.  Benefits of PICC versus port placement were discussed with the patient and family.  Portacatheter is preferred secondary to perceives long-term need.  Sedation: Moderate (conscious) sedation was administered during this procedure.  A total of twomg Versed and 100mg  Fentanyl were administered intravenously.  The patient's vital signs were monitored continuously by radiology nursing throughout the  course of the procedure.  Total sedation time: 25 minutes  Fluoroscopy Time: 18 seconds minutes.  Procedure:  The right neck and chest was prepped with chlorhexidine, and draped in the usual sterile fashion using maximum barrier technique (cap and mask, sterile gown, sterile gloves, large sterile sheet, hand hygiene and cutaneous antiseptic).  Antibiotic prophylaxis was provided with 2g Ancef administered IV one hour prior to skin incision.  Local anesthesia was attained by infiltration with 1% lidocaine with epinephrine.  Ultrasound demonstrated patency of the right internal jugular vein, and this was documented with an image.  Under real-time ultrasound guidance, this vein was accessed with a 21 gauge micropuncture needle and image documentation was performed.  A small dermatotomy was made at the access site with an 11 scalpel.  A 0.018" wire was advanced into the SVC and the access needle exchanged for a 48F micropuncture vascular sheath.  The 0.018" wire was then removed and a 0.035" wire advanced into the IVC.  An appropriate location for the subcutaneous reservoir was selected below the clavicle and an incision was made through the skin and underlying soft tissues.  The subcutaneous tissues were then dissected using a combination of blunt and sharp surgical technique and a pocket was formed.  A single lumen power injectable portacatheter was then tunneled through the subcutaneous tissues from the pocket to the dermatotomy and the port reservoir placed within the subcutaneous pocket.  The venous access site was then serially dilated and a peel away vascular sheath placed over the wire.  The wire was  removed and the port catheter advanced into position under fluoroscopic guidance. The catheter tip is positioned in the superior cavoatrial junction. This was documented with a spot image. The portacatheter was then tested and found to flush and aspirate well.  The port was flushed with saline followed by 100  units/mL heparinized saline.  The pocket was then closed in two layers using first subdermal inverted interrupted absorbable sutures followed by a running subcuticular suture.  The epidermis was then sealed with Dermabond. The dermatotomy at the venous access site was also closed with a single inverted subdermal suture and the epidermis sealed with Dermabond.  Complications:  None.  The patient tolerated the procedure well.  IMPRESSION:  Successful placement of a right IJapproach PowerPort with ultrasound and fluoroscopic guidance.  The catheter is ready for use.  Signed,  Sterling Big, MD Vascular & Interventional Radiologist [facility]   Original Report Authenticated By: Malachy Moan, M.D.   Ir US Guide Vasc Access Right  01/29/2013   *RADIOLOGY REPORT*  TUNNELED CENTRAL VENOUS CATHETER WITH SUBCUTANEOUS RESERVOIR (PORTACATH) PLACEMENT WITH ULTRASOUND AND FLUOROSCOPIC  GUIDANCE  Date: 01/29/2013  Clinical History: 79 year old female with a poor peripheral IV access and frequent need for intravenous medications and peripheral blood draws.  Benefits of PICC versus port placement were discussed with the patient and family.  Portacatheter is preferred secondary to perceives long-term need.  Sedation: Moderate (conscious) sedation was administered during this procedure.  A total of twomg Versed and 100mg  Fentanyl were administered intravenously.  The patient's vital signs were monitored continuously by radiology nursing throughout the course of the procedure.  Total sedation time: 25 minutes  Fluoroscopy Time: 18 seconds minutes.  Procedure:  The right neck and chest was prepped with chlorhexidine, and draped in the usual sterile fashion using maximum barrier technique (cap and mask, sterile gown, sterile gloves, large sterile sheet, hand hygiene and cutaneous antiseptic).  Antibiotic prophylaxis was provided with 2g Ancef administered IV one hour prior to skin incision.  Local anesthesia was  attained by infiltration with 1% lidocaine with epinephrine.  Ultrasound demonstrated patency of the right internal jugular vein, and this was documented with an image.  Under real-time ultrasound guidance, this vein was accessed with a 21 gauge micropuncture needle and image documentation was performed.  A small dermatotomy was made at the access site with an 11 scalpel.  A 0.018" wire was advanced into the SVC and the access needle exchanged for a 16F micropuncture vascular sheath.  The 0.018" wire was then removed and a 0.035" wire advanced into the IVC.  An appropriate location for the subcutaneous reservoir was selected below the clavicle and an incision was made through the skin and underlying soft tissues.  The subcutaneous tissues were then dissected using a combination of blunt and sharp surgical technique and a pocket was formed.  A single lumen power injectable portacatheter was then tunneled through the subcutaneous tissues from the pocket to the dermatotomy and the port reservoir placed within the subcutaneous pocket.  The venous access site was then serially dilated and a peel away vascular sheath placed over the wire.  The wire was removed and the port catheter advanced into position under fluoroscopic guidance. The catheter tip is positioned in the superior cavoatrial junction. This was documented with a spot image. The portacatheter was then tested and found to flush and aspirate well.  The port was flushed with saline followed by 100 units/mL heparinized saline.  The pocket was then closed in  two layers using first subdermal inverted interrupted absorbable sutures followed by a running subcuticular suture.  The epidermis was then sealed with Dermabond. The dermatotomy at the venous access site was also closed with a single inverted subdermal suture and the epidermis sealed with Dermabond.  Complications:  None.  The patient tolerated the procedure well.  IMPRESSION:  Successful placement of a right  IJapproach PowerPort with ultrasound and fluoroscopic guidance.  The catheter is ready for use.  Signed,  Sterling Big, MD Vascular & Interventional Radiologist [facility]   Original Report Authenticated By: Malachy Moan, M.D.     Medications: Scheduled Meds: . buPROPion  150 mg Oral Daily  . digoxin  0.125 mg Oral Daily  . feeding supplement  30 mL Oral Daily  . feeding supplement  1 Container Oral BID BM  . influenza vac split quadrivalent PF  0.5 mL Intramuscular Tomorrow-1000  . levothyroxine  125 mcg Oral QAC breakfast  . loperamide  2 mg Oral BID  . mesalamine  4.8 g Oral Q breakfast  . multivitamin  5 mL Oral Daily  . mycophenolate  1,000 mg Oral BID  . pantoprazole (PROTONIX) IV  40 mg Intravenous Q24H  . predniSONE  10 mg Oral Q breakfast  . sodium chloride  3 mL Intravenous Q12H   Continuous Infusions: . sodium chloride 100 mL/hr at 01/30/13 0039   PRN Meds:.ALPRAZolam, ALPRAZolam, ondansetron (ZOFRAN) IV, ondansetron, sodium chloride    Assessment/Plan: 79 y.o. 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  I think first attempt should be made to treat with anti-viral med (will leave loading, dosing to ID) and only if this is not successful should we entertain the idea of holding or decreasing her cell cept.  I am going to stop the mesalamine we started 2 days ago.    Rob Bunting, MD  01/30/2013, 7:43 AM [facility] Pager [phone]

## 2013-01-31 ENCOUNTER — Ambulatory Visit: Payer: Medicare Other

## 2013-01-31 DIAGNOSIS — N179 Acute kidney failure, unspecified: Secondary | ICD-10-CM

## 2013-01-31 LAB — BASIC METABOLIC PANEL
Calcium: 7.2 mg/dL — ABNORMAL LOW (ref 8.4–10.5)
Creatinine, Ser: 0.67 mg/dL (ref 0.50–1.10)
GFR calc Af Amer: 90 mL/min — ABNORMAL LOW (ref >90)
GFR calc non Af Amer: 77 mL/min — ABNORMAL LOW (ref >90)

## 2013-01-31 LAB — CBC
MCV: 85.6 fL (ref 78.0–100.0)
Platelets: 115 10*3/uL — ABNORMAL LOW (ref 150–400)
RDW: 16.5 % — ABNORMAL HIGH (ref 11.5–15.5)
WBC: 5.2 10*3/uL (ref 4.0–10.5)

## 2013-01-31 MED ORDER — LOPERAMIDE HCL 2 MG PO CAPS
2.0000 mg | ORAL_CAPSULE | Freq: Two times a day (BID) | ORAL | Status: DC
  Administered 2013-01-31 – 2013-02-04 (×13): 2 mg via ORAL
  Filled 2013-01-31 (×15): qty 1

## 2013-01-31 MED ORDER — POTASSIUM CHLORIDE CRYS ER 20 MEQ PO TBCR
40.0000 meq | EXTENDED_RELEASE_TABLET | Freq: Once | ORAL | Status: AC
  Administered 2013-01-31: 11:00:00 40 meq via ORAL
  Filled 2013-01-31 (×2): qty 2

## 2013-01-31 NOTE — Progress Notes (Signed)
Occupational Therapy Treatment Patient Details Name: [name] MRN: [id number] DOB: 06/27/1924 Today's Date: 01/31/2013 Time: 4098-1191 OT Time Calculation (min): 25 min  OT Assessment / Plan / Recommendation     OT comments  Pt with increased ability to participate this day but currently needs  Significant A with ADL activity   Follow Up Recommendations  CIR;SNF;Supervision/Assistance - 24 hour;Home health OT;Other (comment)       Equipment Recommendations  3 in 1 bedside comode          Progress towards OT Goals Progress towards OT goals: Progressing toward goals  Plan Discharge plan needs to be updated    Precautions / Restrictions Precautions Precautions: Fall Restrictions Weight Bearing Restrictions: No       ADL  Toilet Transfer: Performed;Moderate assistance Toilet Transfer Method: Sit to stand Toilet Transfer Equipment: Bedside commode Toileting - Clothing Manipulation and Hygiene: Performed;Maximal assistance Where Assessed - Toileting Clothing Manipulation and Hygiene: Standing Transfers/Ambulation Related to ADLs: Extensive conversation regarding benefits of rehab prior to Dc home.  Pt really wants to go home, but during ambulation pts L knee buckled, as well as feet crossed several times.      OT Goals(current goals can now be found in the care plan section) Acute Rehab OT Goals Patient Stated Goal: be able to take care of my self OT Goal Formulation: With patient Time For Goal Achievement: 02/13/13 Potential to Achieve Goals: Good  Visit Information  Last OT Received On: 01/31/13          Cognition  Cognition Arousal/Alertness: Awake/alert Behavior During Therapy: WFL for tasks assessed/performed Overall Cognitive Status: Within Functional Limits for tasks assessed    Mobility  Bed Mobility Bed Mobility: Supine to Sit Supine to Sit: 4: Min guard Transfers Transfers: Sit to Stand;Stand to Sit Sit to Stand: 3: Mod assist;From  chair/3-in-1;With armrests Stand to Sit: With upper extremity assist;To toilet;3: Mod assist;With armrests          End of Session OT - End of Session Equipment Utilized During Treatment: Gait belt Activity Tolerance: Patient tolerated treatment well Patient left: in chair;with call bell/phone within reach  GO     [name], [place] Kung 01/31/2013, 10:32 AM

## 2013-01-31 NOTE — Progress Notes (Signed)
Patient ID: [name], female   DOB: [birth year], 79 y.o.   MRN: [id number]         [facility] for Infectious Disease    Date of Admission:  01/24/2013           Day 2 valganciclovir Principal Problem:   Ulceration, colon Active Problems:   Diarrhea   Nausea alone   Loss of appetite   Unintentional weight loss   Myasthenia gravis without exacerbation   HYPOTHYROIDISM   HYPERTENSION   DVT of leg (deep venous thrombosis)   Lower GI bleed   Atrial fibrillation   . buPROPion  150 mg Oral Daily  . digoxin  0.125 mg Oral Daily  . feeding supplement  1 Container Oral BID BM  . levothyroxine  125 mcg Oral QAC breakfast  . loperamide  2 mg Oral BID BM & HS  . multivitamin  5 mL Oral Daily  . mycophenolate  1,000 mg Oral BID  . pantoprazole (PROTONIX) IV  40 mg Intravenous Q24H  . predniSONE  10 mg Oral Q breakfast  . sodium chloride  3 mL Intravenous Q12H  . valGANciclovir  900 mg Oral BID    Subjective: She had 4 bowel movements over night but none today so far. She did some walking in the hall and has been up in the chair for several hours today.  Objective: Temp:  [98.6 F (37 C)-98.9 F (37.2 C)] 98.8 F (37.1 C) (09/11 1500) Pulse Rate:  [68-85] 70 (09/11 1500) Resp:  [16-20] 17 (09/11 1500) BP: (109-155)/(55-76) 112/60 mmHg (09/11 1500) SpO2:  [99 %] 99 % (09/11 1500)  General:  She is smiling and in good spirits Abdomen:  Soft and nontender  Lab Results Lab Results  Component Value Date   WBC 5.2 01/31/2013   HGB 8.2* 01/31/2013   HCT 24.9* 01/31/2013   MCV 85.6 01/31/2013   PLT 115* 01/31/2013    Lab Results  Component Value Date   CREATININE 0.67 01/31/2013   BUN 9 01/31/2013   NA 137 01/31/2013   K 3.2* 01/31/2013   CL 113* 01/31/2013   CO2 18* 01/31/2013     Assessment: It is too early to know if she is going to have a positive response to valganciclovir for CMV colitis. She is still on prednisone. I believe this was just started recently  before the cause of her colitis was known. I would prefer that she stop this to try to reduce her immunosuppression as much as possible. I will clarify this with her other physicians. Her platelets are down today but it is too soon for this to be do to valganciclovir.  Plan: 1. Continue valganciclovir 2. Recommend stopping prednisone now 3. Monitor CBC  Cliffton Asters, MD [facility] for Infectious Disease [facility] [phone] pager   [phone] cell 01/31/2013, 5:10 PM

## 2013-01-31 NOTE — Progress Notes (Signed)
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 -ID has started on valganciclovir. -Would prefer to avoid stopping her cellcept if at all possible given issues with swallowing and difficulty breathing when she was off of it years back. -Still with significant diarrhea overnight.  Hypotension -Resolved with IVF.  DVT -Coumadin discontinued and IVC filter placed.  Myasthenia Gravis -Continue cell cept and prednisone.  Generalized Weakness/Deconditioning -PT is recommended CIR/SNF. -Patient is adamant that she wants to go straight home from the hospital. -Not even willing to consider CIR.  Code Status: Full Code   Family Communication: Patient only Disposition Plan: Home when medically stable   Consultants:  GI  ID   Antibiotics:  None  Subjective: Still with diarrhea. Cooperative and pleasant.  Objective: Filed Vitals:   01/30/13 1440 01/30/13 2226 01/31/13 0644 01/31/13 1500  BP: 110/56 155/76 109/55 112/60  Pulse: 76 85 68 70  Temp: 98.7 F (37.1 C) 98.9 F (37.2 C) 98.6 F (37 C) 98.8 F (37.1 C)  TempSrc: Oral Oral Oral Oral  Resp: 16 20 16 17   Height:      Weight:      SpO2: 99% 99% 99% 99%    Intake/Output Summary (Last 24 hours) at 01/31/13 1633 Last data filed at 01/31/13 1400  Gross per 24 hour  Intake 3133.33 ml  Output    800 ml  Net 2333.33 ml   Filed Weights   01/24/13 1700 01/26/13 0000 01/27/13 1208  Weight: 78.8 kg (173 lb 11.6 oz) 76.5 kg (168 lb 10.4 oz) 80.4 kg (177 lb 4 oz)    Exam:   General:  AA OX3, NAD  Cardiovascular: RRR, no M/R?G  Respiratory: CTA B  Abdomen: S/NT/ND/+BS  Extremities: no C/C/E   Neurologic:  Non-focal  Data Reviewed: Basic Metabolic Panel:  Recent Labs Lab 01/26/13 0232 01/27/13 1114 01/28/13 0400 01/29/13 0425  01/31/13 0445  NA 134* 135 135 135 137  K 4.0 3.3* 3.8 3.2* 3.2*  CL 108 107 110 110 113*  CO2 15* 17* 18* 18* 18*  GLUCOSE 108* 116* 127* 109* 113*  BUN 13 11 12 11 9   CREATININE 0.82 0.90 0.83 0.84 0.67  CALCIUM 8.0* 8.4 7.9* 7.8* 7.2*  MG  --   --  1.7  --   --    Liver Function Tests: No results found for this basename: AST, ALT, ALKPHOS, BILITOT, PROT, ALBUMIN,  in the last 168 hours No results found for this basename: LIPASE, AMYLASE,  in the last 168 hours No results found for this basename: AMMONIA,  in the last 168 hours CBC:  Recent Labs Lab 01/26/13 0232  01/26/13 1840 01/27/13 0250 01/28/13 0400 01/29/13 0425 01/31/13 0610  WBC 5.6  --   --   --  5.2 6.0 5.2  HGB 9.2*  < > 9.5* 8.9* 8.9* 9.1* 8.2*  HCT 28.4*  --   --   --  26.9* 27.9* 24.9*  MCV 85.3  --   --   --  84.3 86.4 85.6  PLT 218  --   --   --  200 183 115*  < > = values in this interval not displayed. Cardiac Enzymes: No results found for this basename: CKTOTAL, CKMB, CKMBINDEX, TROPONINI,  in the last 168 hours BNP (last 3 results)  Recent Labs  12/08/12 0026  PROBNP 465.5*   CBG: No results  found for this basename: GLUCAP,  in the last 168 hours  Recent Results (from the past 240 hour(s))  MRSA PCR SCREENING     Status: None   Collection Time    01/24/13  6:48 PM      Result Value Range Status   MRSA by PCR NEGATIVE  NEGATIVE Final   Comment:            The GeneXpert MRSA Assay (FDA     approved for NASAL specimens     only), is one component of a     comprehensive MRSA colonization     surveillance program. It is not     intended to diagnose MRSA     infection nor to guide or     monitor treatment for     MRSA infections.  CLOSTRIDIUM DIFFICILE BY PCR     Status: None   Collection Time    01/25/13  8:00 AM      Result Value Range Status   C difficile by pcr NEGATIVE  NEGATIVE Final   Comment: Performed at [facility]     Studies: No results found.  Scheduled  Meds: . buPROPion  150 mg Oral Daily  . digoxin  0.125 mg Oral Daily  . feeding supplement  1 Container Oral BID BM  . levothyroxine  125 mcg Oral QAC breakfast  . loperamide  2 mg Oral BID BM & HS  . multivitamin  5 mL Oral Daily  . mycophenolate  1,000 mg Oral BID  . pantoprazole (PROTONIX) IV  40 mg Intravenous Q24H  . predniSONE  10 mg Oral Q breakfast  . sodium chloride  3 mL Intravenous Q12H  . valGANciclovir  900 mg Oral BID   Continuous Infusions: . sodium chloride 100 mL/hr at 01/30/13 2229    Principal Problem:   Ulceration, colon Active Problems:   HYPOTHYROIDISM   Myasthenia gravis without exacerbation   HYPERTENSION   Diarrhea   Nausea alone   Loss of appetite   DVT of leg (deep venous thrombosis)   Lower GI bleed   Unintentional weight loss   Atrial fibrillation    Time spent: 35 minutes.    [name]  [facility] Pager [phone]  If 7PM-7AM, please contact night-coverage at [email or web address], password [facility] 01/31/2013, 4:33 PM  LOS: 7 days

## 2013-01-31 NOTE — Progress Notes (Signed)
Physical Therapy Treatment Patient Details Name: Cyndie Woodbeck MRN: [id number] DOB: 05/04/1925 Today's Date: 01/31/2013 Time: 4098-1191 PT Time Calculation (min): 19 min  PT Assessment / Plan / Recommendation  History of Present Illness     PT Comments   Encouraged pt that rehab would be beneficial. MD in room and encouraged pt. Pt states she wants to go home. Pt did ambulated with RW but gait is very unsteady and requires steady assistance. Pt has abnormal gait pattern.  Follow Up Recommendations  SNF (discussed with pt that recommended SNF.)     Does the patient have the potential to tolerate intense rehabilitation     Barriers to Discharge        Equipment Recommendations       Recommendations for Other Services    Frequency Min 3X/week   Progress towards PT Goals    Plan Discharge plan needs to be updated    Precautions / Restrictions Precautions Precautions: Fall Precaution Comments: generalized weakness   Pertinent Vitals/Pain No c/o    Mobility  Bed Mobility Bed Mobility: Supine to Sit Supine to Sit: 4: Min guard Transfers Transfers: Sit to Stand;Stand to Sit Sit to Stand: 3: Mod assist;From chair/3-in-1;With armrests Stand to Sit: With upper extremity assist;To toilet;3: Mod assist;With armrests Details for Transfer Assistance: pt did need mod A with initial sit to stand wth arm rests, multimodal cues for use of UE's Ambulation/Gait Ambulation/Gait Assistance: 1: +2 Total assist Ambulation/Gait: Patient Percentage: 70% Ambulation Distance (Feet): 80 Feet Assistive device: Rolling walker Ambulation/Gait Assistance Details: Pt 's L knee isflexed at stance and then extends. R leg adducts and crosses. pt did get legs tangled.  Gait Pattern: Step-to pattern;Scissoring    Exercises     PT Diagnosis:    PT Problem List:   PT Treatment Interventions:     PT Goals (current goals can now be found in the care plan section) Acute Rehab PT Goals Patient Stated  Goal: be able to take care of my self  Visit Information  Last PT Received On: 01/31/13 Assistance Needed: +2    Subjective Data  Patient Stated Goal: be able to take care of my self   Cognition  Cognition Arousal/Alertness: Awake/alert Behavior During Therapy: WFL for tasks assessed/performed Overall Cognitive Status: Within Functional Limits for tasks assessed    Balance  Static Sitting Balance Static Sitting - Balance Support: No upper extremity supported;Feet supported Static Sitting - Level of Assistance: 7: Independent  End of Session PT - End of Session Equipment Utilized During Treatment: Gait belt Activity Tolerance: Patient tolerated treatment well Patient left: in chair Nurse Communication: Mobility status   GP     Rada Hay 01/31/2013, 12:42 PM Blanchard Kelch PT [phone]

## 2013-01-31 NOTE — Progress Notes (Signed)
NUTRITION FOLLOW UP  Intervention:   Continue to provide Resource Breeze BID Continue to Multivitamin with minerals daily Discontinue Pro-stat   Nutrition Dx:   Unintentional wt loss related to diarrhea/decreased PO intake as evidenced by 12% wt loss in less than 2 months; improving  Goal:   Pt to meet >/= 90% of their estimated nutrition needs  Monitor:   PO intake; 75-100% Weight; 4 lbs above admission wt Bowel function; improved Labs; low hemoglobin, low potassium, low calcium  Assessment:   Pt reports feeling better with an improved appetite. She reports that she is eating most of her meals and drinking 1-2 Resource Breeze supplements daily. No additional wt loss since admission. Per MD note pt had 4 BM's overnight but, no diarrhea-soft stools.   Height: Ht Readings from Last 1 Encounters:  01/27/13 5\' 5"  (1.651 m)    Weight Status:   Wt Readings from Last 1 Encounters:  01/27/13 177 lb 4 oz (80.4 kg)    Re-estimated needs:  Kcal: 1600-1800  Protein: 90-100 grams  Fluid: 2.3 L/day  Skin: non-pitting RUE and LUE edema, +1 RLE and LLE edema; excoriated buttocks  Diet Order: Dysphagia   Intake/Output Summary (Last 24 hours) at 01/31/13 1449 Last data filed at 01/31/13 1400  Gross per 24 hour  Intake 3133.33 ml  Output    800 ml  Net 2333.33 ml    Last BM: 9/10   Labs:   Recent Labs Lab 01/28/13 0400 01/29/13 0425 01/31/13 0445  NA 135 135 137  K 3.8 3.2* 3.2*  CL 110 110 113*  CO2 18* 18* 18*  BUN 12 11 9   CREATININE 0.83 0.84 0.67  CALCIUM 7.9* 7.8* 7.2*  MG 1.7  --   --   GLUCOSE 127* 109* 113*    CBG (last 3)  No results found for this basename: GLUCAP,  in the last 72 hours  Scheduled Meds: . buPROPion  150 mg Oral Daily  . digoxin  0.125 mg Oral Daily  . feeding supplement  30 mL Oral Daily  . feeding supplement  1 Container Oral BID BM  . levothyroxine  125 mcg Oral QAC breakfast  . loperamide  2 mg Oral BID BM & HS  .  multivitamin  5 mL Oral Daily  . mycophenolate  1,000 mg Oral BID  . pantoprazole (PROTONIX) IV  40 mg Intravenous Q24H  . predniSONE  10 mg Oral Q breakfast  . sodium chloride  3 mL Intravenous Q12H  . valGANciclovir  900 mg Oral BID    Continuous Infusions: . sodium chloride 100 mL/hr at 01/30/13 2229    Ian Malkin RD, LDN Inpatient Clinical Dietitian Pager: [phone] After Hours Pager: [phone]

## 2013-01-31 NOTE — Progress Notes (Signed)
Patient ID: [name], female   DOB: 1924/11/17, 79 y.o.   MRN: [id number] [facility] Progress Note  Subjective: Says up last night 4 times for BM's ,but not diarrhea-soft stools, only 1-2 bM's during the day yesterday. No pain or cramping, and able to eat. Started ganciclovir  last pm  Objective:  Vital signs in last 24 hours: Temp:  [98.6 F (37 C)-98.9 F (37.2 C)] 98.6 F (37 C) (09/11 0644) Pulse Rate:  [68-85] 68 (09/11 0644) Resp:  [16-20] 16 (09/11 0644) BP: (109-155)/(55-76) 109/55 mmHg (09/11 0644) SpO2:  [99 %] 99 % (09/11 0644) Last BM Date: 01/30/13 General:   Alert,  Well-developed,  Elderly WF  in NAD Heart:  Regular rate and rhythm; no murmurs Pulm;clear Abdomen:  Soft, nontender and nondistended. Normal bowel sounds, without guarding, and without rebound.   Extremities:  Without edema. Neurologic:  Alert and  oriented x4;  grossly normal neurologically. Psych:  Alert and cooperative. Normal mood and affect.  Intake/Output from previous day: 09/10 0701 - 09/11 0700 In: 2813.3 [P.O.:480; I.V.:2333.3] Out: 550 [Urine:550] Intake/Output this shift:    Lab Results:  Recent Labs  01/29/13 0425 01/31/13 0610  WBC 6.0 5.2  HGB 9.1* 8.2*  HCT 27.9* 24.9*  PLT 183 115*   BMET  Recent Labs  01/29/13 0425 01/31/13 0445  NA 135 137  K 3.2* 3.2*  CL 110 113*  CO2 18* 18*  GLUCOSE 109* 113*  BUN 11 9  CREATININE 0.84 0.67  CALCIUM 7.8* 7.2*   PT/INR  Recent Labs  01/28/13 1150 01/29/13 0425  LABPROT 24.8* 17.9*  INR 2.33* 1.52*      Assessment / Plan: #1  79 yo female with persistent diarrhea, and colitis on bx  -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 Leone Payor as needed. We will sign off-available if can  help  Principal Problem:   Ulceration, colon Active Problems:   HYPOTHYROIDISM   Myasthenia gravis without exacerbation   HYPERTENSION   Diarrhea   Nausea alone   Loss of appetite   DVT of leg (deep venous thrombosis)   Lower GI bleed   Unintentional weight loss   Atrial fibrillation     LOS: 7 days   Amy Esterwood  01/31/2013, 9:07 AM   ________________________________________________________________________  [facility] MD note:  I personally examined the patient, reviewed the data and agree with the assessment and plan described above.  Her imodium should be given TID on scheduled basis with the last one of the day given at bedtime.  Valgancyclovir per ID recs.  I agree that decreasing her immunosupression would be very helpful if neurology feels it is safe. Certainly cutting her prednisone back to 5mg  per day (from 10mg  per day) is probably a safe first step.   Rob Bunting, MD [facility] Pager [phone]

## 2013-02-01 DIAGNOSIS — R5381 Other malaise: Secondary | ICD-10-CM

## 2013-02-01 DIAGNOSIS — A09 Infectious gastroenteritis and colitis, unspecified: Secondary | ICD-10-CM

## 2013-02-01 DIAGNOSIS — K529 Noninfective gastroenteritis and colitis, unspecified: Secondary | ICD-10-CM

## 2013-02-01 LAB — BASIC METABOLIC PANEL
GFR calc Af Amer: 88 mL/min — ABNORMAL LOW (ref >90)
GFR calc non Af Amer: 76 mL/min — ABNORMAL LOW (ref >90)
Glucose, Bld: 155 mg/dL — ABNORMAL HIGH (ref 70–99)
Potassium: 3.2 mEq/L — ABNORMAL LOW (ref 3.5–5.1)
Sodium: 134 mEq/L — ABNORMAL LOW (ref 135–145)

## 2013-02-01 LAB — CBC
Hemoglobin: 7.6 g/dL — ABNORMAL LOW (ref 12.0–15.0)
MCHC: 32.9 g/dL (ref 30.0–36.0)
WBC: 4.7 10*3/uL (ref 4.0–10.5)

## 2013-02-01 MED ORDER — PREDNISONE 5 MG PO TABS
5.0000 mg | ORAL_TABLET | Freq: Every day | ORAL | Status: DC
  Administered 2013-02-01: 09:00:00 5 mg via ORAL
  Filled 2013-02-01 (×2): qty 1

## 2013-02-01 NOTE — Consult Note (Signed)
Physical Medicine and Rehabilitation Consult Reason for Consult; deconditioning/colitis Referring Physician: [facility]   HPI: [name] is a 79 y.o. right-handed female with recent hospitalization for colitis and colonoscopy showing colon ulcers as well as recently diagnosed with DVT on Coumadin therapy as well as history of myasthenia gravis on CellCept. Admitted 01/24/2013 with heme occult positive and hemoglobin of 7. She complained of generalized weakness and being lightheaded. She denied any nausea or vomiting. Patient also with recent UTI completing antibiotic therapy. INR on admission of 2.69. Underwent flexible sigmoidoscopy 01/26/2013 per Dr. Elenore Paddy with findings of ulcers/colitis in the sigmoid colon and descending colon. 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 Patient's Coumadin was discontinued in light of suspected GI bleed with IVC filter placed 01/26/2013. Patient has been transfused 2 units of packed red blood cells. She currently remains on CellCept as well as prednisone therapy for myasthenia gravis. Patient with generalized deconditioning with followup physical and occupational therapy with recommendations for physical medicine rehabilitation consult to consider inpatient rehabilitation services   Review of Systems  Cardiovascular: Positive for palpitations and leg swelling.  Gastrointestinal: Positive for diarrhea and blood in stool.  Psychiatric/Behavioral:       Anxiety   Past Medical History  Diagnosis Date  . Anemia   . Hyperlipemia   . Hypertension   . Thyroid disease     hypothyroidism  . Anxiety   . Paroxysmal atrial fibrillation   . Vitamin B 12 deficiency   . CHF (congestive heart failure)   . TIA (transient ischemic attack)     4-09  . Microscopic hematuria     benign microscopic hematuria, worked up b Dr Alexis Frock  . Gall stone     porcelain gall bladder with single stone  per CT  02-2009 Dr Birdie Sons  . Osteoporosis     DEXA on 04-30-10  . Myasthenia gravis     sees Dr. Terrace Arabia   . Ulcerative colitis   . DVT of leg (deep venous thrombosis)     left leg    Past Surgical History  Procedure Laterality Date  . Cardiac catheterization      04/1999  . Abdominal hysterectomy      with oophorectomy  . Colonoscopy  1999    normal   . Colonoscopy N/A 12/10/2012    Procedure: COLONOSCOPY;  Surgeon: Iva Boop, MD;  Location: [facility];  Service: Endoscopy;  Laterality: N/A;  . Flexible sigmoidoscopy N/A 01/26/2013    Procedure: FLEXIBLE SIGMOIDOSCOPY;  Surgeon: Iva Boop, MD;  Location: [facility];  Service: Endoscopy;  Laterality: N/A;   Family History  Problem Relation Age of Onset  . Heart disease Father    Social History:  reports that she has never smoked. She has never used smokeless tobacco. She reports that she does not drink alcohol or use illicit drugs. Allergies:  Allergies  Allergen Reactions  . Furosemide Rash    REACTION: unspecified  . Ivp Dye [Iodinated Diagnostic Agents] Rash  . Sulfamethoxazole Rash    REACTION: unspecified   Medications Prior to Admission  Medication Dose Route Frequency Provider Last Rate Last Dose  . [DISCONTINUED] cyanocobalamin injection 1,000 mcg  1,000 mcg Intramuscular Q30 days Nelwyn Salisbury, MD   1,000 mcg at 08/01/11 1210   Medications Prior to Admission  Medication Sig Dispense Refill  . ALPRAZolam (XANAX) 0.25 MG tablet Take 0.25 mg by mouth 2 (two) times daily as needed for anxiety.      Marland Kitchen  ALPRAZolam (XANAX) 1 MG tablet Take 1 mg by mouth at bedtime as needed for sleep.      Marland Kitchen atorvastatin (LIPITOR) 20 MG tablet Take 20 mg by mouth daily with supper.       Marland Kitchen buPROPion (WELLBUTRIN XL) 150 MG 24 hr tablet Take 1 tablet (150 mg total) by mouth every morning.  30 tablet  11  . [EXPIRED] cefpodoxime (VANTIN) 100 MG tablet Take 100 mg by mouth 2 (two) times daily. A 10 day course.      Marland Kitchen co-enzyme Q-10 50  MG capsule Take 50 mg by mouth at bedtime.       . cyanocobalamin (,VITAMIN B-12,) 1000 MCG/ML injection Inject 1,000 mcg into the muscle every 30 (thirty) days.      . feeding supplement (ENSURE COMPLETE) LIQD Take 237 mLs by mouth daily.  30 Bottle  0  . feeding supplement (RESOURCE BREEZE) LIQD Take 1 Container by mouth 2 (two) times daily between meals.  60 Container  0  . fish oil-omega-3 fatty acids 1000 MG capsule Take 1 g by mouth daily.       Marland Kitchen glucosamine-chondroitin 500-400 MG tablet Take 1 tablet by mouth 2 (two) times daily.  60 tablet  0  . irbesartan-hydrochlorothiazide (AVALIDE) 300-12.5 MG per tablet Take 1 tablet by mouth daily.  30 tablet  0  . levothyroxine (SYNTHROID, LEVOTHROID) 125 MCG tablet Take 1 tablet (125 mcg total) by mouth daily.  30 tablet  11  . loperamide (IMODIUM) 2 MG capsule Take 1 capsule (2 mg total) by mouth 3 (three) times daily.  90 capsule  0  . mycophenolate (CELLCEPT) 500 MG tablet Take 1,000 mg by mouth 2 (two) times daily.       . potassium chloride (K-DUR) 10 MEQ tablet Take 20 mEq by mouth 2 (two) times daily with a meal.       . [EXPIRED] predniSONE (DELTASONE) 20 MG tablet Take 10 mg by mouth daily. End of a taper.      . Probiotic Product (PROBIOTIC DAILY PO) Take by mouth.      . ranitidine (ZANTAC) 150 MG tablet Take 150 mg by mouth daily.      . vitamin C (ASCORBIC ACID) 250 MG tablet Take 250 mg by mouth daily.      Marland Kitchen warfarin (COUMADIN) 1 MG tablet Take 1-2 mg by mouth as directed. Dose was changed today from 5mg  daily to 1mg  alternating with 2mg . Starting with 1mg  dose on 9/5.        Home: Home Living Family/patient expects to be discharged to:: Private residence Living Arrangements: Children Available Help at Discharge: Family;Available 24 hours/day Home Access: Level entry Home Layout: Two level;Able to live on main level with bedroom/bathroom Home Equipment: Walker - 4 wheels Additional Comments: lives with daughter and SIL, SIL  is retired and available to assist as needed  Functional History:   Functional Status:  Mobility: Bed Mobility Bed Mobility: Supine to Sit Supine to Sit: 4: Min guard Sit to Supine: 4: Min assist Transfers Transfers: Sit to Stand;Stand to Sit Sit to Stand: 3: Mod assist;From chair/3-in-1;With armrests Stand to Sit: With upper extremity assist;To toilet;3: Mod assist;With armrests Ambulation/Gait Ambulation/Gait Assistance: 1: +2 Total assist Ambulation/Gait: Patient Percentage: 70% Ambulation Distance (Feet): 80 Feet Assistive device: Rolling walker Ambulation/Gait Assistance Details: Pt 's L knee isflexed at stance and then extends. R leg adducts and crosses. pt did get legs tangled.  Gait Pattern: Step-to pattern;Scissoring    ADL:  ADL Grooming: Performed;Wash/dry hands;Wash/dry face Where Assessed - Grooming: Unsupported sitting Upper Body Dressing: Simulated;Min guard Where Assessed - Upper Body Dressing: Unsupported sitting Lower Body Dressing: Simulated;Maximal assistance Where Assessed - Lower Body Dressing: Supported sit to stand Toilet Transfer: Performed;Moderate assistance Toilet Transfer Method: Sit to stand Toilet Transfer Equipment: Bedside commode Transfers/Ambulation Related to ADLs: Extensive conversation regarding benefits of rehab prior to Dc home.  Pt really wants to go home, but during ambulation pts L knee buckled, as well as feet crossed several times. ADL Comments: son in law can help pt with ADL activity as needed  Cognition: Cognition Overall Cognitive Status: Within Functional Limits for tasks assessed Orientation Level: Oriented X4 Cognition Arousal/Alertness: Awake/alert Behavior During Therapy: WFL for tasks assessed/performed Overall Cognitive Status: Within Functional Limits for tasks assessed  Blood pressure 120/50, pulse 69, temperature 98.2 F (36.8 C), temperature source Oral, resp. rate 18, height 5\' 5"  (1.651 m), weight 80.4 kg (177  lb 4 oz), SpO2 100.00%. Physical Exam  Vitals reviewed. Constitutional: She is oriented to person, place, and time. She appears well-developed and well-nourished.  HENT:  Head: Normocephalic and atraumatic.  Eyes: EOM are normal.  Neck: Normal range of motion. Neck supple. No JVD present. No tracheal deviation present. No thyromegaly present.  Cardiovascular: Normal rate and regular rhythm.  Exam reveals no friction rub.   No murmur heard. Pulmonary/Chest: Effort normal and breath sounds normal. No respiratory distress. She has no wheezes. She has no rales.  Abdominal: Soft. Bowel sounds are normal. She exhibits no distension. There is no tenderness. There is no rebound.  Lymphadenopathy:    She has no cervical adenopathy.  Neurological: She is alert and oriented to person, place, and time. No cranial nerve deficit. Coordination normal.  Moved all 4's. No sensory loss distally. Strength 3+ deltoid, biceps, triceps, 4/5 HI. LE 3/5 HF, KE,  Foot 4/5.   Skin: Skin is warm and dry.  Psychiatric: She has a normal mood and affect. Her behavior is normal. Thought content normal.    Results for orders placed during the hospital encounter of 01/24/13 (from the past 24 hour(s))  CBC     Status: Abnormal   Collection Time    02/01/13  5:00 AM      Result Value Range   WBC 4.7  4.0 - 10.5 K/uL   RBC 2.69 (*) 3.87 - 5.11 MIL/uL   Hemoglobin 7.6 (*) 12.0 - 15.0 g/dL   HCT 45.4 (*) 09.8 - 11.9 %   MCV 85.9  78.0 - 100.0 fL   MCH 28.3  26.0 - 34.0 pg   MCHC 32.9  30.0 - 36.0 g/dL   RDW 14.7 (*) 82.9 - 56.2 %   Platelets 111 (*) 150 - 400 K/uL   No results found.  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 Does the need for close, 24 hr/day medical supervision in concert with the patient's rehab needs make it unreasonable for this patient to be served in a less intensive setting? Yes 2. Co-Morbidities requiring supervision/potential complications: htn, afib 3. Due to  bladder management, bowel management, safety, skin/wound care, disease management, medication administration, pain management and patient education, does the patient require 24 hr/day rehab nursing? Yes 4. Does the patient require coordinated care of a physician, rehab nurse, PT (1-2 hrs/day, 5 days/week) and OT (1-2 hrs/day, 5 days/week) to address physical and functional deficits in the context of the above medical diagnosis(es)? Yes Addressing deficits in the following areas: balance, endurance,  locomotion, strength, transferring, bowel/bladder control, bathing, dressing, feeding, grooming and toileting 5. Can the patient actively participate in an intensive therapy program of at least 3 hrs of therapy per day at least 5 days per week? Yes 6. The potential for patient to make measurable gains while on inpatient rehab is excellent 7. Anticipated functional outcomes upon discharge from inpatient rehab are mod I to supervision with PT, mod I to supervision with OT, n/a with SLP. 8. Estimated rehab length of stay to reach the above functional goals is: 8-12 days 9. Does the patient have adequate social supports to accommodate these discharge functional goals? Yes 10. Anticipated D/C setting: Home 11. Anticipated post D/C treatments: HH therapy 12. Overall Rehab/Functional Prognosis: excellent  RECOMMENDATIONS: This patient's condition is appropriate for continued rehabilitative care in the following setting: CIR Patient has agreed to participate in recommended program. Yes Note that insurance prior authorization may be required for reimbursement for recommended care.  Comment: Rehab RN to follow up.   Ranelle Oyster, MD, [place] [facility]      02/01/2013

## 2013-02-01 NOTE — Progress Notes (Signed)
Physical Therapy Treatment Patient Details Name: Cristie Mckinney MRN: [id number] DOB: December 26, 1924 Today's Date: 02/01/2013 Time: 1191-4782 PT Time Calculation (min): 28 min  PT Assessment / Plan / Recommendation  History of Present Illness Terry Abila is a 79 y.o. female with recent hospitalization for colitis and a colonoscopy showing  Colon ulcers, recently diagnosed with DVT on coumadin, Myasthenia gravis on cellcept, came in for recurrence of diarrhea and a bloody bowel movement today. She was found to be heme occult positive and her hemoglobin 7. She also complained of generalized weakness and almost fainted on day of admission. She denies abd pain or nausea or vomiting or hematemesis. She is on vantin for recently diag UTI and has completed 8 days of 10 day course.Pt with porta cath placement on 01/29/13    PT Comments   Assisted pt OOB to [facility] as pt cont to have freq loose stools.  Amb in hallway with second assist following with recliner as pt demon B LE weakness and limited activity tolerance.  Pt is now agreeable to ST SNF.     Follow Up Recommendations  SNF (pt now agreeable)     Does the patient have the potential to tolerate intense rehabilitation     Barriers to Discharge        Equipment Recommendations       Recommendations for Other Services    Frequency Min 3X/week   Progress towards PT Goals Progress towards PT goals: Progressing toward goals  Plan      Precautions / Restrictions Precautions Precautions: Fall Precaution Comments: generalized weakness Restrictions Weight Bearing Restrictions: No    Pertinent Vitals/Pain No c/o pain    Mobility  Bed Mobility Bed Mobility: Supine to Sit Sit to Supine: 4: Min assist Details for Bed Mobility Assistance: assist with B LE's and assist to rise Transfers Transfers: Sit to Stand;Stand to Sit Sit to Stand: 3: Mod assist;With armrests;From bed;From toilet Stand to Sit: 4: Min assist Details for Transfer  Assistance: 50% Vc's on proper tech and hand placement esp to increase self assist to rise.   Ambulation/Gait Ambulation/Gait Assistance: 1: +2 Total assist Ambulation/Gait: Patient Percentage: 80% Ambulation Distance (Feet): 70 Feet Assistive device: Rolling walker Ambulation/Gait Assistance Details: Pt demon mild anxiety with MAX c/o weakness and fear of falling due to B LE weakness. @nd  assist followed with recliner.  Gait Pattern: Step-to pattern;Trunk flexed Gait velocity: decreased     PT Goals (current goals can now be found in the care plan section)    Visit Information  Last PT Received On: 02/01/13 Assistance Needed: +1 History of Present Illness: Sharlot Sturkey is a 79 y.o. female with recent hospitalization for colitis and a colonoscopy showing  Colon ulcers, recently diagnosed with DVT on coumadin, Myasthenia gravis on cellcept, came in for recurrence of diarrhea and a bloody bowel movement today. She was found to be heme occult positive and her hemoglobin 7. She also complained of generalized weakness and almost fainted on day of admission. She denies abd pain or nausea or vomiting or hematemesis. She is on vantin for recently diag UTI and has completed 8 days of 10 day course.Pt with porta cath placement on 01/29/13     Subjective Data      Cognition       Balance     End of Session PT - End of Session Equipment Utilized During Treatment: Gait belt Activity Tolerance: Patient tolerated treatment well Patient left: in chair   Lawson Fiscal Viridiana Spaid  PTA [facility]  Acute  Rehab Pager      [phone]

## 2013-02-01 NOTE — Progress Notes (Signed)
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 -ID has started on valganciclovir. -Would prefer to avoid stopping her cellcept if at all possible given issues with swallowing and difficulty breathing when she was off of it years back. -Diarrhea seems to be improving. -Prednisone has been discontinued.  Hypotension -Resolved with IVF.  DVT -Coumadin discontinued and IVC filter placed. -No plans to restart anticoagulation given recent GI Bleed.  Myasthenia Gravis -Continue cell cept and prednisone.  Generalized Weakness/Deconditioning -PT is recommended CIR/SNF. -Awaiting CIR.  Code Status: Full Code   Family Communication: Patient only Disposition Plan: CIR vs SNF   Consultants:  ID   Antibiotics:  None  Subjective: Still with diarrhea, altho improved. Cooperative and pleasant.  Objective: Filed Vitals:   01/31/13 1500 01/31/13 2148 02/01/13 0552 02/01/13 1322  BP: 112/60 152/73 120/50 135/53  Pulse: 70 76 69 71  Temp: 98.8 F (37.1 C) 98.6 F (37 C) 98.2 F (36.8 C) 98.1 F (36.7 C)  TempSrc: Oral Oral Oral Oral  Resp: 17 20 18 18   Height:      Weight:      SpO2: 99% 100% 100% 100%    Intake/Output Summary (Last 24 hours) at 02/01/13 1635 Last data filed at 02/01/13 1411  Gross per 24 hour  Intake   1840 ml  Output   1500 ml  Net    340 ml   Filed Weights   01/24/13 1700 01/26/13 0000 01/27/13 1208  Weight: 78.8 kg (173 lb 11.6 oz) 76.5 kg (168 lb 10.4 oz) 80.4 kg (177 lb 4 oz)    Exam:   General:  AA OX3, NAD  Cardiovascular: RRR, no M/R?G  Respiratory: CTA B  Abdomen: S/NT/ND/+BS  Extremities: no C/C/E   Neurologic:  Non-focal  Data Reviewed: Basic Metabolic Panel:  Recent Labs Lab 01/27/13 1114 01/28/13 0400 01/29/13 0425 01/31/13 0445 02/01/13 1013  NA 135  135 135 137 134*  K 3.3* 3.8 3.2* 3.2* 3.2*  CL 107 110 110 113* 107  CO2 17* 18* 18* 18* 17*  GLUCOSE 116* 127* 109* 113* 155*  BUN 11 12 11 9 8   CREATININE 0.90 0.83 0.84 0.67 0.70  CALCIUM 8.4 7.9* 7.8* 7.2* 7.6*  MG  --  1.7  --   --   --    Liver Function Tests: No results found for this basename: AST, ALT, ALKPHOS, BILITOT, PROT, ALBUMIN,  in the last 168 hours No results found for this basename: LIPASE, AMYLASE,  in the last 168 hours No results found for this basename: AMMONIA,  in the last 168 hours CBC:  Recent Labs Lab 01/26/13 0232  01/27/13 0250 01/28/13 0400 01/29/13 0425 01/31/13 0610 02/01/13 0500  WBC 5.6  --   --  5.2 6.0 5.2 4.7  HGB 9.2*  < > 8.9* 8.9* 9.1* 8.2* 7.6*  HCT 28.4*  --   --  26.9* 27.9* 24.9* 23.1*  MCV 85.3  --   --  84.3 86.4 85.6 85.9  PLT 218  --   --  200 183 115* 111*  < > = values in this interval not displayed. Cardiac Enzymes: No results found for this basename: CKTOTAL, CKMB, CKMBINDEX, TROPONINI,  in the last 168 hours BNP (last 3 results)  Recent Labs  12/08/12 0026  PROBNP 465.5*   CBG: No results found for this basename: GLUCAP,  in the last  168 hours  Recent Results (from the past 240 hour(s))  MRSA PCR SCREENING     Status: None   Collection Time    01/24/13  6:48 PM      Result Value Range Status   MRSA by PCR NEGATIVE  NEGATIVE Final   Comment:            The GeneXpert MRSA Assay (FDA     approved for NASAL specimens     only), is one component of a     comprehensive MRSA colonization     surveillance program. It is not     intended to diagnose MRSA     infection nor to guide or     monitor treatment for     MRSA infections.  CLOSTRIDIUM DIFFICILE BY PCR     Status: None   Collection Time    01/25/13  8:00 AM      Result Value Range Status   C difficile by pcr NEGATIVE  NEGATIVE Final   Comment: Performed at [facility]     Studies: No results found.  Scheduled Meds: . buPROPion  150 mg  Oral Daily  . digoxin  0.125 mg Oral Daily  . feeding supplement  1 Container Oral BID BM  . levothyroxine  125 mcg Oral QAC breakfast  . loperamide  2 mg Oral BID BM & HS  . multivitamin  5 mL Oral Daily  . mycophenolate  1,000 mg Oral BID  . pantoprazole (PROTONIX) IV  40 mg Intravenous Q24H  . sodium chloride  3 mL Intravenous Q12H  . valGANciclovir  900 mg Oral BID   Continuous Infusions: . sodium chloride 100 mL/hr at 02/01/13 0346    Principal Problem:   CMV colitis Active Problems:   HYPOTHYROIDISM   Myasthenia gravis without exacerbation   HYPERTENSION   Nausea alone   Loss of appetite   DVT of leg (deep venous thrombosis)   Lower GI bleed   Unintentional weight loss   Atrial fibrillation    Time spent: 35 minutes.    [name]  [facility] Pager [phone]  If 7PM-7AM, please contact night-coverage at [email or web address], password [facility] 02/01/2013, 4:35 PM  LOS: 8 days

## 2013-02-01 NOTE — Progress Notes (Signed)
Patient ID: [name], female   DOB: 17-May-1925, 79 y.o.   MRN: [id number]         [facility]    Date of Admission:  01/24/2013           Day 3 valganciclovir  Principal Problem:   CMV colitis Active Problems:   Nausea alone   Loss of appetite   Unintentional weight loss   Myasthenia gravis without exacerbation   HYPOTHYROIDISM   HYPERTENSION   DVT of leg (deep venous thrombosis)   Lower GI bleed   Atrial fibrillation   . buPROPion  150 mg Oral Daily  . digoxin  0.125 mg Oral Daily  . feeding supplement  1 Container Oral BID BM  . levothyroxine  125 mcg Oral QAC breakfast  . loperamide  2 mg Oral BID BM & HS  . multivitamin  5 mL Oral Daily  . mycophenolate  1,000 mg Oral BID  . pantoprazole (PROTONIX) IV  40 mg Intravenous Q24H  . predniSONE  5 mg Oral Q breakfast  . sodium chloride  3 mL Intravenous Q12H  . valGANciclovir  900 mg Oral BID    Subjective: She's had 2 watery bowel movements in the past 24 hours without any blood.  Objective: Temp:  [98.2 F (36.8 C)-98.8 F (37.1 C)] 98.2 F (36.8 C) (09/12 0552) Pulse Rate:  [69-76] 69 (09/12 0552) Resp:  [17-20] 18 (09/12 0552) BP: (112-152)/(50-73) 120/50 mmHg (09/12 0552) SpO2:  [99 %-100 %] 100 % (09/12 0552)  General: Alert and in no distress Abdomen: Soft and nontender   Lab Results Lab Results  Component Value Date   WBC 4.7 02/01/2013   HGB 7.6* 02/01/2013   HCT 23.1* 02/01/2013   MCV 85.9 02/01/2013   PLT 111* 02/01/2013    Lab Results  Component Value Date   CREATININE 0.70 02/01/2013   BUN 8 02/01/2013   NA 134* 02/01/2013   K 3.2* 02/01/2013   CL 107 02/01/2013   CO2 17* 02/01/2013   Assessment: It is too early to expect significant improvement in her CMV colitis. Her hemoglobin and platelets have dropped since starting the valganciclovir but this is very early to expect such an adverse reaction. I will continue valganciclovir for now and will have Dr. Judyann Munson check the results of her CBC over the weekend. Prednisone had been started recently before he was known what was causing her colitis. I will stop it now to try to reduce her immunosuppression. She is still reluctant to consider stopping mycophenolate.  Plan: 1. Continue valganciclovir 2. Discontinue prednisone 3. I will have Dr. Judyann Munson review the results of her CBC over the weekend and make any changes that are needed. Please call her [phone]) for any other infectious disease questions.  Cliffton Asters, MD [facility] [phone] pager   [phone] cell 02/01/2013, 10:56 AM

## 2013-02-01 NOTE — Progress Notes (Signed)
Rehab admissions - Evaluated for possible admission.  I will be following this patient.  I will see her on Monday morning.  Call me for questions.  #[phone]

## 2013-02-02 LAB — CBC
HCT: 23.5 % — ABNORMAL LOW (ref 36.0–46.0)
Hemoglobin: 7.7 g/dL — ABNORMAL LOW (ref 12.0–15.0)
WBC: 4.4 10*3/uL (ref 4.0–10.5)

## 2013-02-02 LAB — BASIC METABOLIC PANEL
BUN: 6 mg/dL (ref 6–23)
Chloride: 111 mEq/L (ref 96–112)
GFR calc Af Amer: 89 mL/min — ABNORMAL LOW (ref >90)
Potassium: 2.5 mEq/L — CL (ref 3.5–5.1)

## 2013-02-02 MED ORDER — POTASSIUM CHLORIDE CRYS ER 20 MEQ PO TBCR
20.0000 meq | EXTENDED_RELEASE_TABLET | Freq: Every day | ORAL | Status: DC
  Filled 2013-02-02: qty 1

## 2013-02-02 MED ORDER — POTASSIUM CHLORIDE CRYS ER 20 MEQ PO TBCR
20.0000 meq | EXTENDED_RELEASE_TABLET | Freq: Every day | ORAL | Status: DC
  Administered 2013-02-02 – 2013-02-04 (×3): 20 meq via ORAL
  Filled 2013-02-02 (×3): qty 1

## 2013-02-02 MED ORDER — POTASSIUM CHLORIDE CRYS ER 20 MEQ PO TBCR
40.0000 meq | EXTENDED_RELEASE_TABLET | ORAL | Status: AC
  Administered 2013-02-02 (×3): 40 meq via ORAL
  Filled 2013-02-02 (×4): qty 2

## 2013-02-02 NOTE — Progress Notes (Signed)
CRITICAL VALUE ALERT  Critical value received:  K+ 2.5  Date of notification:  02/02/2013  Time of notification:  0655  Critical value read back:yes  Nurse who received alert:  Audria Nine RN  MD notified (1st page):  Ardyth Harps   Time of first page:  0701  MD notified (2nd page):  Time of second page:  Responding MD:  No return call, reported critical lab to oncoming RN  Time MD responded:

## 2013-02-02 NOTE — Progress Notes (Addendum)
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 -ID has started on valganciclovir. -Would prefer to avoid stopping her cellcept if at all possible given issues with swallowing and difficulty breathing when she was off of it years back. -Diarrhea seems to be improving. -Prednisone has been discontinued.  Hypokalemia -Repleting. -2/2 diarrhea. -Recheck in am.  Hypotension -Resolved with IVF.  DVT -Coumadin discontinued and IVC filter placed. -No plans to restart anticoagulation given recent GI Bleed.  Myasthenia Gravis -Continue cell cept and prednisone.  Generalized Weakness/Deconditioning -Awaiting CIR.  Code Status: Full Code   Family Communication: Patient only Disposition Plan: CIR vs SNF   Consultants:  ID   Antibiotics:  None  Subjective: Still with diarrhea overnight.  Objective: Filed Vitals:   02/01/13 1322 02/01/13 2048 02/02/13 0527 02/02/13 1033  BP: 135/53 133/63 133/57   Pulse: 71 73 68 76  Temp: 98.1 F (36.7 C) 98.8 F (37.1 C) 97.9 F (36.6 C)   TempSrc: Oral Oral Oral   Resp: 18 18 20    Height:      Weight:      SpO2: 100% 100% 99%     Intake/Output Summary (Last 24 hours) at 02/02/13 1207 Last data filed at 02/02/13 1100  Gross per 24 hour  Intake   3120 ml  Output    800 ml  Net   2320 ml   Filed Weights   01/24/13 1700 01/26/13 0000 01/27/13 1208  Weight: 78.8 kg (173 lb 11.6 oz) 76.5 kg (168 lb 10.4 oz) 80.4 kg (177 lb 4 oz)    Exam:   General:  AA OX3, NAD  Cardiovascular: RRR, no M/R?G  Respiratory: CTA B  Abdomen: S/NT/ND/+BS  Extremities: no C/C/E   Neurologic:  Non-focal  Data Reviewed: Basic Metabolic Panel:  Recent Labs Lab 01/28/13 0400 01/29/13 0425 01/31/13 0445 02/01/13 1013 02/02/13 0540  NA 135 135 137 134* 137  K 3.8 3.2*  3.2* 3.2* 2.5*  CL 110 110 113* 107 111  CO2 18* 18* 18* 17* 18*  GLUCOSE 127* 109* 113* 155* 102*  BUN 12 11 9 8 6   CREATININE 0.83 0.84 0.67 0.70 0.69  CALCIUM 7.9* 7.8* 7.2* 7.6* 7.3*  MG 1.7  --   --   --   --    Liver Function Tests: No results found for this basename: AST, ALT, ALKPHOS, BILITOT, PROT, ALBUMIN,  in the last 168 hours No results found for this basename: LIPASE, AMYLASE,  in the last 168 hours No results found for this basename: AMMONIA,  in the last 168 hours CBC:  Recent Labs Lab 01/28/13 0400 01/29/13 0425 01/31/13 0610 02/01/13 0500 02/02/13 0540  WBC 5.2 6.0 5.2 4.7 4.4  HGB 8.9* 9.1* 8.2* 7.6* 7.7*  HCT 26.9* 27.9* 24.9* 23.1* 23.5*  MCV 84.3 86.4 85.6 85.9 85.5  PLT 200 183 115* 111* 115*   Cardiac Enzymes: No results found for this basename: CKTOTAL, CKMB, CKMBINDEX, TROPONINI,  in the last 168 hours BNP (last 3 results)  Recent Labs  12/08/12 0026  PROBNP 465.5*   CBG: No results found for this basename: GLUCAP,  in the last 168 hours  Recent Results (from the past 240 hour(s))  MRSA PCR SCREENING     Status: None   Collection Time    01/24/13  6:48 PM      Result Value Range Status  MRSA by PCR NEGATIVE  NEGATIVE Final   Comment:            The GeneXpert MRSA Assay (FDA     approved for NASAL specimens     only), is one component of a     comprehensive MRSA colonization     surveillance program. It is not     intended to diagnose MRSA     infection nor to guide or     monitor treatment for     MRSA infections.  CLOSTRIDIUM DIFFICILE BY PCR     Status: None   Collection Time    01/25/13  8:00 AM      Result Value Range Status   C difficile by pcr NEGATIVE  NEGATIVE Final   Comment: Performed at [facility]     Studies: No results found.  Scheduled Meds: . buPROPion  150 mg Oral Daily  . digoxin  0.125 mg Oral Daily  . feeding supplement  1 Container Oral BID BM  . levothyroxine  125 mcg Oral QAC breakfast  .  loperamide  2 mg Oral BID BM & HS  . multivitamin  5 mL Oral Daily  . mycophenolate  1,000 mg Oral BID  . pantoprazole (PROTONIX) IV  40 mg Intravenous Q24H  . potassium chloride  40 mEq Oral Q4H  . sodium chloride  3 mL Intravenous Q12H  . valGANciclovir  900 mg Oral BID   Continuous Infusions: . sodium chloride 100 mL/hr at 02/02/13 1028    Principal Problem:   CMV colitis Active Problems:   HYPOTHYROIDISM   Myasthenia gravis without exacerbation   HYPERTENSION   Nausea alone   Loss of appetite   DVT of leg (deep venous thrombosis)   Lower GI bleed   Unintentional weight loss   Atrial fibrillation    Time spent: 35 minutes.    [name]  [facility] Pager [phone]  If 7PM-7AM, please contact night-coverage at [email or web address], password [facility] 02/02/2013, 12:07 PM  LOS: 9 days

## 2013-02-03 DIAGNOSIS — I4891 Unspecified atrial fibrillation: Secondary | ICD-10-CM

## 2013-02-03 LAB — BASIC METABOLIC PANEL
BUN: 5 mg/dL — ABNORMAL LOW (ref 6–23)
CO2: 18 mEq/L — ABNORMAL LOW (ref 19–32)
Chloride: 113 mEq/L — ABNORMAL HIGH (ref 96–112)
Glucose, Bld: 114 mg/dL — ABNORMAL HIGH (ref 70–99)
Potassium: 3.4 mEq/L — ABNORMAL LOW (ref 3.5–5.1)

## 2013-02-03 LAB — CBC
HCT: 23.1 % — ABNORMAL LOW (ref 36.0–46.0)
Hemoglobin: 7.7 g/dL — ABNORMAL LOW (ref 12.0–15.0)
MCH: 28.5 pg (ref 26.0–34.0)
MCHC: 33.3 g/dL (ref 30.0–36.0)

## 2013-02-03 NOTE — Progress Notes (Signed)
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 -ID has started on valganciclovir. -Would prefer to avoid stopping her cellcept if at all possible given issues with swallowing and difficulty breathing when she was off of it years back. -Diarrhea seems to be improving. -Prednisone has been discontinued.  Hypokalemia -Repleting. -2/2 diarrhea. -Recheck in am.  Hypotension -Resolved with IVF.  DVT -Coumadin discontinued and IVC filter placed. -No plans to restart anticoagulation given recent GI Bleed.  Myasthenia Gravis -Continue cell cept and prednisone.  Generalized Weakness/Deconditioning -Awaiting CIR.  Code Status: Full Code   Family Communication: Patient only Disposition Plan: CIR vs SNF   Consultants:  ID   Antibiotics:  None  Subjective: Still with diarrhea overnight, altho much improved.  Objective: Filed Vitals:   02/02/13 1033 02/02/13 1418 02/02/13 2218 02/03/13 0540  BP:  136/61 129/50 135/62  Pulse: 76 73 73 70  Temp:  97.7 F (36.5 C) 98.3 F (36.8 C) 98.2 F (36.8 C)  TempSrc:  Oral Oral Oral  Resp:  16 19 18   Height:      Weight:      SpO2:  100% 100% 100%    Intake/Output Summary (Last 24 hours) at 02/03/13 1318 Last data filed at 02/03/13 1250  Gross per 24 hour  Intake   2640 ml  Output    875 ml  Net   1765 ml   Filed Weights   01/24/13 1700 01/26/13 0000 01/27/13 1208  Weight: 78.8 kg (173 lb 11.6 oz) 76.5 kg (168 lb 10.4 oz) 80.4 kg (177 lb 4 oz)    Exam:   General:  AA OX3, NAD  Cardiovascular: RRR, no M/R?G  Respiratory: CTA B  Abdomen: S/NT/ND/+BS  Extremities: no C/C/E   Neurologic:  Non-focal  Data Reviewed: Basic Metabolic Panel:  Recent Labs Lab 01/28/13 0400 01/29/13 0425 01/31/13 0445 02/01/13 1013 02/02/13 0540 02/03/13 0412  NA  135 135 137 134* 137 138  K 3.8 3.2* 3.2* 3.2* 2.5* 3.4*  CL 110 110 113* 107 111 113*  CO2 18* 18* 18* 17* 18* 18*  GLUCOSE 127* 109* 113* 155* 102* 114*  BUN 12 11 9 8 6  5*  CREATININE 0.83 0.84 0.67 0.70 0.69 0.63  CALCIUM 7.9* 7.8* 7.2* 7.6* 7.3* 7.5*  MG 1.7  --   --   --   --   --    Liver Function Tests: No results found for this basename: AST, ALT, ALKPHOS, BILITOT, PROT, ALBUMIN,  in the last 168 hours No results found for this basename: LIPASE, AMYLASE,  in the last 168 hours No results found for this basename: AMMONIA,  in the last 168 hours CBC:  Recent Labs Lab 01/29/13 0425 01/31/13 0610 02/01/13 0500 02/02/13 0540 02/03/13 0412  WBC 6.0 5.2 4.7 4.4 4.5  HGB 9.1* 8.2* 7.6* 7.7* 7.7*  HCT 27.9* 24.9* 23.1* 23.5* 23.1*  MCV 86.4 85.6 85.9 85.5 85.6  PLT 183 115* 111* 115* 120*   Cardiac Enzymes: No results found for this basename: CKTOTAL, CKMB, CKMBINDEX, TROPONINI,  in the last 168 hours BNP (last 3 results)  Recent Labs  12/08/12 0026  PROBNP 465.5*   CBG: No results found for this basename: GLUCAP,  in the last 168 hours  Recent Results (from the past 240 hour(s))  MRSA PCR SCREENING     Status: None   Collection Time  01/24/13  6:48 PM      Result Value Range Status   MRSA by PCR NEGATIVE  NEGATIVE Final   Comment:            The GeneXpert MRSA Assay (FDA     approved for NASAL specimens     only), is one component of a     comprehensive MRSA colonization     surveillance program. It is not     intended to diagnose MRSA     infection nor to guide or     monitor treatment for     MRSA infections.  CLOSTRIDIUM DIFFICILE BY PCR     Status: None   Collection Time    01/25/13  8:00 AM      Result Value Range Status   C difficile by pcr NEGATIVE  NEGATIVE Final   Comment: Performed at [facility]     Studies: No results found.  Scheduled Meds: . buPROPion  150 mg Oral Daily  . digoxin  0.125 mg Oral Daily  . feeding  supplement  1 Container Oral BID BM  . levothyroxine  125 mcg Oral QAC breakfast  . loperamide  2 mg Oral BID BM & HS  . multivitamin  5 mL Oral Daily  . mycophenolate  1,000 mg Oral BID  . pantoprazole (PROTONIX) IV  40 mg Intravenous Q24H  . potassium chloride  20 mEq Oral Daily  . sodium chloride  3 mL Intravenous Q12H  . valGANciclovir  900 mg Oral BID   Continuous Infusions: . sodium chloride 100 mL/hr at 02/03/13 0707    Principal Problem:   CMV colitis Active Problems:   HYPOTHYROIDISM   Myasthenia gravis without exacerbation   HYPERTENSION   Nausea alone   Loss of appetite   DVT of leg (deep venous thrombosis)   Lower GI bleed   Unintentional weight loss   Atrial fibrillation    Time spent: 25 minutes.    [name]  [facility] Pager [phone]  If 7PM-7AM, please contact night-coverage at [email or web address], password [facility] 02/03/2013, 1:18 PM  LOS: 10 days

## 2013-02-04 ENCOUNTER — Inpatient Hospital Stay (HOSPITAL_COMMUNITY)
Admission: AD | Admit: 2013-02-04 | Discharge: 2013-02-12 | DRG: 945 | Disposition: A | Discharge status: Home Health | Payer: Medicare Other | Source: Intra-hospital | Attending: Physical Medicine & Rehabilitation | Admitting: Physical Medicine & Rehabilitation

## 2013-02-04 DIAGNOSIS — Z7901 Long term (current) use of anticoagulants: Secondary | ICD-10-CM

## 2013-02-04 DIAGNOSIS — R5381 Other malaise: Secondary | ICD-10-CM

## 2013-02-04 DIAGNOSIS — Z86718 Personal history of other venous thrombosis and embolism: Secondary | ICD-10-CM

## 2013-02-04 DIAGNOSIS — M810 Age-related osteoporosis without current pathological fracture: Secondary | ICD-10-CM | POA: Diagnosis present

## 2013-02-04 DIAGNOSIS — A0839 Other viral enteritis: Secondary | ICD-10-CM | POA: Diagnosis present

## 2013-02-04 DIAGNOSIS — I4891 Unspecified atrial fibrillation: Secondary | ICD-10-CM | POA: Diagnosis present

## 2013-02-04 DIAGNOSIS — E039 Hypothyroidism, unspecified: Secondary | ICD-10-CM | POA: Diagnosis present

## 2013-02-04 DIAGNOSIS — F411 Generalized anxiety disorder: Secondary | ICD-10-CM | POA: Diagnosis present

## 2013-02-04 DIAGNOSIS — D62 Acute posthemorrhagic anemia: Secondary | ICD-10-CM

## 2013-02-04 DIAGNOSIS — I509 Heart failure, unspecified: Secondary | ICD-10-CM | POA: Diagnosis present

## 2013-02-04 DIAGNOSIS — Z5189 Encounter for other specified aftercare: Principal | ICD-10-CM

## 2013-02-04 DIAGNOSIS — F341 Dysthymic disorder: Secondary | ICD-10-CM | POA: Diagnosis present

## 2013-02-04 DIAGNOSIS — G7000 Myasthenia gravis without (acute) exacerbation: Secondary | ICD-10-CM

## 2013-02-04 DIAGNOSIS — B259 Cytomegaloviral disease, unspecified: Secondary | ICD-10-CM | POA: Diagnosis present

## 2013-02-04 DIAGNOSIS — I10 Essential (primary) hypertension: Secondary | ICD-10-CM | POA: Diagnosis present

## 2013-02-04 DIAGNOSIS — I5032 Chronic diastolic (congestive) heart failure: Secondary | ICD-10-CM | POA: Diagnosis present

## 2013-02-04 DIAGNOSIS — E785 Hyperlipidemia, unspecified: Secondary | ICD-10-CM | POA: Diagnosis present

## 2013-02-04 LAB — BASIC METABOLIC PANEL
BUN: 4 mg/dL — ABNORMAL LOW (ref 6–23)
Calcium: 7.3 mg/dL — ABNORMAL LOW (ref 8.4–10.5)
GFR calc non Af Amer: 79 mL/min — ABNORMAL LOW (ref >90)
Glucose, Bld: 101 mg/dL — ABNORMAL HIGH (ref 70–99)

## 2013-02-04 MED ORDER — BOOST / RESOURCE BREEZE PO LIQD
1.0000 | Freq: Two times a day (BID) | ORAL | Status: DC
  Administered 2013-02-05 – 2013-02-12 (×10): 1 via ORAL

## 2013-02-04 MED ORDER — ONDANSETRON HCL 4 MG/2ML IJ SOLN
4.0000 mg | Freq: Four times a day (QID) | INTRAMUSCULAR | Status: DC | PRN
  Administered 2013-02-09: 4 mg via INTRAVENOUS
  Filled 2013-02-04: qty 2

## 2013-02-04 MED ORDER — LOPERAMIDE HCL 2 MG PO CAPS
2.0000 mg | ORAL_CAPSULE | Freq: Two times a day (BID) | ORAL | Status: DC
  Administered 2013-02-04 – 2013-02-08 (×12): 2 mg via ORAL
  Filled 2013-02-04 (×19): qty 1

## 2013-02-04 MED ORDER — ALPRAZOLAM 0.5 MG PO TABS
1.0000 mg | ORAL_TABLET | Freq: Every evening | ORAL | Status: DC | PRN
  Administered 2013-02-05 – 2013-02-11 (×5): 1 mg via ORAL
  Filled 2013-02-04 (×5): qty 2

## 2013-02-04 MED ORDER — POTASSIUM CHLORIDE CRYS ER 20 MEQ PO TBCR
40.0000 meq | EXTENDED_RELEASE_TABLET | ORAL | Status: DC
  Administered 2013-02-04: 10:00:00 40 meq via ORAL
  Filled 2013-02-04 (×2): qty 2

## 2013-02-04 MED ORDER — ONDANSETRON HCL 4 MG PO TABS
4.0000 mg | ORAL_TABLET | Freq: Four times a day (QID) | ORAL | Status: DC | PRN
  Administered 2013-02-09 (×2): 4 mg via ORAL
  Filled 2013-02-04 (×2): qty 1

## 2013-02-04 MED ORDER — DIGOXIN 125 MCG PO TABS
0.1250 mg | ORAL_TABLET | Freq: Every day | ORAL | Status: DC
  Administered 2013-02-05 – 2013-02-12 (×8): 0.125 mg via ORAL
  Filled 2013-02-04 (×9): qty 1

## 2013-02-04 MED ORDER — SODIUM CHLORIDE 0.9 % IJ SOLN
10.0000 mL | INTRAMUSCULAR | Status: DC | PRN
  Administered 2013-02-04 – 2013-02-07 (×6): 10 mL
  Administered 2013-02-08: 30 mL
  Administered 2013-02-09 – 2013-02-12 (×2): 10 mL

## 2013-02-04 MED ORDER — VALGANCICLOVIR HCL 450 MG PO TABS
900.0000 mg | ORAL_TABLET | Freq: Two times a day (BID) | ORAL | Status: DC
  Administered 2013-02-04 – 2013-02-12 (×15): 900 mg via ORAL
  Filled 2013-02-04 (×18): qty 2

## 2013-02-04 MED ORDER — ALPRAZOLAM 0.25 MG PO TABS: 0.2500 mg | ORAL_TABLET | Freq: Two times a day (BID) | ORAL | Status: DC | PRN

## 2013-02-04 MED ORDER — ACETAMINOPHEN 325 MG PO TABS: 325.0000 mg | ORAL_TABLET | ORAL | Status: DC | PRN

## 2013-02-04 MED ORDER — MYCOPHENOLATE MOFETIL 250 MG PO CAPS
1000.0000 mg | ORAL_CAPSULE | Freq: Two times a day (BID) | ORAL | Status: DC
  Administered 2013-02-04 – 2013-02-12 (×15): 1000 mg via ORAL
  Filled 2013-02-04 (×19): qty 4

## 2013-02-04 MED ORDER — POTASSIUM CHLORIDE CRYS ER 20 MEQ PO TBCR
20.0000 meq | EXTENDED_RELEASE_TABLET | Freq: Every day | ORAL | Status: DC
  Filled 2013-02-04 (×3): qty 1

## 2013-02-04 MED ORDER — LEVOTHYROXINE SODIUM 125 MCG PO TABS
125.0000 ug | ORAL_TABLET | Freq: Every day | ORAL | Status: DC
  Administered 2013-02-05 – 2013-02-12 (×8): 125 ug via ORAL
  Filled 2013-02-04 (×9): qty 1

## 2013-02-04 MED ORDER — ADULT MULTIVITAMIN LIQUID CH
5.0000 mL | Freq: Every day | ORAL | Status: DC
  Administered 2013-02-05 – 2013-02-12 (×8): 5 mL via ORAL
  Filled 2013-02-04 (×10): qty 5

## 2013-02-04 MED ORDER — PANTOPRAZOLE SODIUM 40 MG PO TBEC
40.0000 mg | DELAYED_RELEASE_TABLET | Freq: Two times a day (BID) | ORAL | Status: DC
  Administered 2013-02-04 – 2013-02-12 (×15): 40 mg via ORAL
  Filled 2013-02-04 (×13): qty 1

## 2013-02-04 MED ORDER — BUPROPION HCL ER (XL) 150 MG PO TB24
150.0000 mg | ORAL_TABLET | Freq: Every day | ORAL | Status: DC
  Administered 2013-02-05 – 2013-02-12 (×8): 150 mg via ORAL
  Filled 2013-02-04 (×9): qty 1

## 2013-02-04 NOTE — H&P (Signed)
Physical Medicine and Rehabilitation Admission H&P    Chief Complaint  Patient presents with  . Abnormal Lab  : HPI: [name] is a 79 y.o. right-handed female with recent hospitalization for colitis and colonoscopy showing colon ulcers as well as recently diagnosed with DVT(left common femoral vein, left popliteal vein, posterior tibial and left peroneal vein) on Coumadin therapy as well as history of myasthenia gravis(followed by Dr. Yan) on CellCept/prednisone therapy. Admitted 01/24/2013 with heme occult positive and hemoglobin of 7. She complained of generalized weakness and being lightheaded. She denied any nausea or vomiting. Patient also with recent UTI completing antibiotic therapy. INR on admission of 2.69. Underwent flexible sigmoidoscopy 01/26/2013 per Dr. Gesner with findings of ulcers/colitis in the sigmoid colon and descending colon. 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 She was placed on Valcyte 01/29/2013 for CMV per infectious disease Dr. John Campbell twice daily x3 weeks then decrease to 900 mg daily. Patient's Coumadin was discontinued in light of suspected GI bleed with IVC filter placed 01/26/2013. Patient has been transfused 2 units of packed red blood cells. Close monitoring of hemoglobin 7.7 which remains stable and platelet counts 120,000. She currently remains on CellCept  for myasthenia gravis. She had recent been placed on prednisone for myasthenia gravis this was discontinued to try to reduce her immunosuppression. Patient with generalized deconditioning with followup physical and occupational therapy with recommendations for physical medicine rehabilitation consult to consider inpatient rehabilitation services. Patient was felt to be a candidate for inpatient rehabilitation services and was admitted for comprehensive rehabilitation program  Review of Systems  Cardiovascular: Positive for  palpitations and leg swelling.  Gastrointestinal: Positive for diarrhea and blood in stool.  Psychiatric/Behavioral:  Anxiety  Remaining review of systems negative.  Past Medical History  Diagnosis Date  . Anemia   . Hyperlipemia   . Hypertension   . Thyroid disease     hypothyroidism  . Anxiety   . Paroxysmal atrial fibrillation   . Vitamin B 12 deficiency   . CHF (congestive heart failure)   . TIA (transient ischemic attack)     4-09  . Microscopic hematuria     benign microscopic hematuria, worked up b Dr Sig Tannenbaum  . Gall stone     porcelain gall bladder with single stone  per CT 02-2009 Dr Matthew Tsui  . Osteoporosis     DEXA on 04-30-10  . Myasthenia gravis     sees Dr. Yan   . Ulcerative colitis   . DVT of leg (deep venous thrombosis)     left leg    Past Surgical History  Procedure Laterality Date  . Cardiac catheterization      04/1999  . Abdominal hysterectomy      with oophorectomy  . Colonoscopy  1999    normal   . Colonoscopy N/A 12/10/2012    Procedure: COLONOSCOPY;  Surgeon: Carl E Gessner, MD;  Location: [facility];  Service: Endoscopy;  Laterality: N/A;  . Flexible sigmoidoscopy N/A 01/26/2013    Procedure: FLEXIBLE SIGMOIDOSCOPY;  Surgeon: Carl E Gessner, MD;  Location: [facility];  Service: Endoscopy;  Laterality: N/A;   Family History  Problem Relation Age of Onset  . Heart disease Father    Social History:  reports that she has never smoked. She has never used smokeless tobacco. She reports that she does not drink alcohol or use illicit drugs. Allergies:  Allergies  Allergen Reactions  . Furosemide Rash      REACTION: unspecified  . Ivp Dye [Iodinated Diagnostic Agents] Rash  . Sulfamethoxazole Rash    REACTION: unspecified   Medications Prior to Admission  Medication Dose Route Frequency Provider Last Rate Last Dose  . [DISCONTINUED] cyanocobalamin injection 1,000 mcg  1,000 mcg Intramuscular Q30 days Stephen A Fry, MD   1,000 mcg  at 08/01/11 1210   Medications Prior to Admission  Medication Sig Dispense Refill  . ALPRAZolam (XANAX) 0.25 MG tablet Take 0.25 mg by mouth 2 (two) times daily as needed for anxiety.      . ALPRAZolam (XANAX) 1 MG tablet Take 1 mg by mouth at bedtime as needed for sleep.      . atorvastatin (LIPITOR) 20 MG tablet Take 20 mg by mouth daily with supper.       . buPROPion (WELLBUTRIN XL) 150 MG 24 hr tablet Take 1 tablet (150 mg total) by mouth every morning.  30 tablet  11  . [EXPIRED] cefpodoxime (VANTIN) 100 MG tablet Take 100 mg by mouth 2 (two) times daily. A 10 day course.      . co-enzyme Q-10 50 MG capsule Take 50 mg by mouth at bedtime.       . cyanocobalamin (,VITAMIN B-12,) 1000 MCG/ML injection Inject 1,000 mcg into the muscle every 30 (thirty) days.      . feeding supplement (ENSURE COMPLETE) LIQD Take 237 mLs by mouth daily.  30 Bottle  0  . feeding supplement (RESOURCE BREEZE) LIQD Take 1 Container by mouth 2 (two) times daily between meals.  60 Container  0  . fish oil-omega-3 fatty acids 1000 MG capsule Take 1 g by mouth daily.       . glucosamine-chondroitin 500-400 MG tablet Take 1 tablet by mouth 2 (two) times daily.  60 tablet  0  . irbesartan-hydrochlorothiazide (AVALIDE) 300-12.5 MG per tablet Take 1 tablet by mouth daily.  30 tablet  0  . levothyroxine (SYNTHROID, LEVOTHROID) 125 MCG tablet Take 1 tablet (125 mcg total) by mouth daily.  30 tablet  11  . loperamide (IMODIUM) 2 MG capsule Take 1 capsule (2 mg total) by mouth 3 (three) times daily.  90 capsule  0  . mycophenolate (CELLCEPT) 500 MG tablet Take 1,000 mg by mouth 2 (two) times daily.       . potassium chloride (K-DUR) 10 MEQ tablet Take 20 mEq by mouth 2 (two) times daily with a meal.       . [EXPIRED] predniSONE (DELTASONE) 20 MG tablet Take 10 mg by mouth daily. End of a taper.      . Probiotic Product (PROBIOTIC DAILY PO) Take by mouth.      . ranitidine (ZANTAC) 150 MG tablet Take 150 mg by mouth daily.       . vitamin C (ASCORBIC ACID) 250 MG tablet Take 250 mg by mouth daily.      . warfarin (COUMADIN) 1 MG tablet Take 1-2 mg by mouth as directed. Dose was changed today from 5mg daily to 1mg alternating with 2mg. Starting with 1mg dose on 9/5.        Home: Home Living Family/patient expects to be discharged to:: Private residence Living Arrangements: Children Available Help at Discharge: Family;Available 24 hours/day Home Access: Level entry Home Layout: Two level;Able to live on main level with bedroom/bathroom Home Equipment: Walker - 4 wheels Additional Comments: lives with daughter and SIL, SIL is retired and available to assist as needed   Functional History:    Functional Status:    Mobility: Bed Mobility Bed Mobility: Supine to Sit Supine to Sit: 4: Min guard Sit to Supine: 4: Min assist Transfers Transfers: Sit to Stand;Stand to Sit Sit to Stand: 3: Mod assist;With armrests;From bed;From toilet Stand to Sit: 4: Min assist Ambulation/Gait Ambulation/Gait Assistance: 1: +2 Total assist Ambulation/Gait: Patient Percentage: 80% Ambulation Distance (Feet): 70 Feet Assistive device: Rolling walker Ambulation/Gait Assistance Details: Pt demon mild anxiety with MAX c/o weakness and fear of falling due to B LE weakness. @nd assist followed with recliner.  Gait Pattern: Step-to pattern;Trunk flexed Gait velocity: decreased    ADL: ADL Grooming: Performed;Wash/dry hands;Wash/dry face Where Assessed - Grooming: Unsupported sitting Upper Body Dressing: Simulated;Min guard Where Assessed - Upper Body Dressing: Unsupported sitting Lower Body Dressing: Simulated;Maximal assistance Where Assessed - Lower Body Dressing: Supported sit to stand Toilet Transfer: Performed;Moderate assistance Toilet Transfer Method: Sit to stand Toilet Transfer Equipment: Bedside commode Transfers/Ambulation Related to ADLs: Extensive conversation regarding benefits of rehab prior to Dc home.  Pt  really wants to go home, but during ambulation pts L knee buckled, as well as feet crossed several times. ADL Comments: son in law can help pt with ADL activity as needed  Cognition: Cognition Overall Cognitive Status: Within Functional Limits for tasks assessed Orientation Level: Oriented X4 Cognition Arousal/Alertness: Awake/alert Behavior During Therapy: WFL for tasks assessed/performed Overall Cognitive Status: Within Functional Limits for tasks assessed  Physical Exam: Blood pressure 126/47, pulse 65, temperature 97.3 F (36.3 C), temperature source Oral, resp. rate 18, height 5' 5" (1.651 m), weight 80.4 kg (177 lb 4 oz), SpO2 100.00%. Constitutional: She is oriented to person, place, and time. She appears well-developed and well-nourished.  HENT:  Head: Normocephalic and atraumatic.  Eyes: EOM are normal.  Neck: Normal range of motion. Neck supple. No JVD present. No tracheal deviation present. No thyromegaly present.  Cardiovascular: Normal rate and regular rhythm. Exam reveals no friction rub.  No murmur heard.  Pulmonary/Chest: Effort normal and breath sounds normal. No respiratory distress. She has no wheezes. She has no rales.  Abdominal: Soft. Bowel sounds are normal. She exhibits no distension. There is no tenderness. There is no rebound.  Lymphadenopathy:  She has no cervical adenopathy.  Neurological: She is alert and oriented to person, place, and time. No cranial nerve deficit. Coordination normal.  Moved all 4's. No sensory loss distally. Strength 4- deltoid, biceps, triceps, 4/5 HI. LE 3/5 HF, KE 3+, Feet 4/5.  Skin: Skin is warm and dry. Are of superficial breakdown over the sacral breakdown, approx 3x2in and second is 2x2  Both shallow with small amount of fibronecrotic tissue at surface.  Psychiatric: She has a normal mood and affect. Her behavior is normal. Thought content normal.     Results for orders placed during the hospital encounter of 01/24/13 (from  the past 48 hour(s))  BASIC METABOLIC PANEL     Status: Abnormal   Collection Time    02/03/13  4:12 AM      Result Value Range   Sodium 138  135 - 145 mEq/L   Potassium 3.4 (*) 3.5 - 5.1 mEq/L   Comment: NO VISIBLE HEMOLYSIS     DELTA CHECK NOTED   Chloride 113 (*) 96 - 112 mEq/L   CO2 18 (*) 19 - 32 mEq/L   Glucose, Bld 114 (*) 70 - 99 mg/dL   BUN 5 (*) 6 - 23 mg/dL   Creatinine, Ser 0.63  0.50 - 1.10 mg/dL   Calcium 7.5 (*) 8.4 - 10.5   mg/dL   GFR calc non Af Amer 79 (*) >90 mL/min   GFR calc Af Amer >90  >90 mL/min   Comment: (NOTE)     The eGFR has been calculated using the CKD EPI equation.     This calculation has not been validated in all clinical situations.     eGFR's persistently <90 mL/min signify possible Chronic Kidney     Disease.  CBC     Status: Abnormal   Collection Time    02/03/13  4:12 AM      Result Value Range   WBC 4.5  4.0 - 10.5 K/uL   RBC 2.70 (*) 3.87 - 5.11 MIL/uL   Hemoglobin 7.7 (*) 12.0 - 15.0 g/dL   HCT 23.1 (*) 36.0 - 46.0 %   MCV 85.6  78.0 - 100.0 fL   MCH 28.5  26.0 - 34.0 pg   MCHC 33.3  30.0 - 36.0 g/dL   RDW 16.9 (*) 11.5 - 15.5 %   Platelets 120 (*) 150 - 400 K/uL  GLUCOSE, CAPILLARY     Status: Abnormal   Collection Time    02/03/13  9:00 PM      Result Value Range   Glucose-Capillary 119 (*) 70 - 99 mg/dL   Comment 1 Notify RN    BASIC METABOLIC PANEL     Status: Abnormal   Collection Time    02/04/13  6:12 AM      Result Value Range   Sodium 138  135 - 145 mEq/L   Potassium 3.0 (*) 3.5 - 5.1 mEq/L   Chloride 110  96 - 112 mEq/L   CO2 20  19 - 32 mEq/L   Glucose, Bld 101 (*) 70 - 99 mg/dL   BUN 4 (*) 6 - 23 mg/dL   Creatinine, Ser 0.63  0.50 - 1.10 mg/dL   Calcium 7.3 (*) 8.4 - 10.5 mg/dL   GFR calc non Af Amer 79 (*) >90 mL/min   GFR calc Af Amer >90  >90 mL/min   Comment: (NOTE)     The eGFR has been calculated using the CKD EPI equation.     This calculation has not been validated in all clinical situations.      eGFR's persistently <90 mL/min signify possible Chronic Kidney     Disease.   No results found.  Post Admission Physician Evaluation: 1. Functional deficits secondary  to severe deconditioning related to CMV colitis. 2. Patient is admitted to receive collaborative, interdisciplinary care between the physiatrist, rehab nursing staff, and therapy team. 3. Patient's level of medical complexity and substantial therapy needs in context of that medical necessity cannot be provided at a lesser intensity of care such as a SNF. 4. Patient has experienced substantial functional loss from his/her baseline which was documented above under the "Functional History" and "Functional Status" headings.  Judging by the patient's diagnosis, physical exam, and functional history, the patient has potential for functional progress which will result in measurable gains while on inpatient rehab.  These gains will be of substantial and practical use upon discharge  in facilitating mobility and self-care at the household level. 5. Physiatrist will provide 24 hour management of medical needs as well as oversight of the therapy plan/treatment and provide guidance as appropriate regarding the interaction of the two. 6. 24 hour rehab nursing will assist with bladder management, bowel management, safety, skin/wound care, disease management, medication administration, pain management and patient education  and help integrate therapy concepts, techniques,education, etc.   7. PT will assess and treat for/with: Lower extremity strength, range of motion, stamina, balance, functional mobility, safety, adaptive techniques and equipment, pain mgt, education.   Goals are: mod I to supervision. 8. OT will assess and treat for/with: ADL's, functional mobility, safety, upper extremity strength, adaptive techniques and equipment, .   Goals are: mod I/supervision. 9. SLP will assess and treat for/with: n/a.  Goals are: n/a. 10. Case Management and  Social Worker will assess and treat for psychological issues and discharge planning. 11. Team conference will be held weekly to assess progress toward goals and to determine barriers to discharge. 12. Patient will receive at least 3 hours of therapy per day at least 5 days per week. 13. ELOS: 7-10 days       14. Prognosis:  excellent   Medical Problem List and Plan: 1. 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 DVT Prophylaxis/Anticoagulation: Recent findings of left DVT 01/14/2013. Coumadin discontinued suspect GI bleed with IVC filter placed 01/26/2013. 3. Pain Management: Tylenol as needed 4. Mood: Xanax as needed, Wellbutrin 150 mg daily. Provide emotional support 5. Neuropsych: This patient is capable of making decisions on his own behalf. 6. Myasthenia gravis. Continue CellCept as well as Valcyte. Prednisone discontinued to try to help reduce her immunosuppression 7. CMV colitis. Followup per infectious disease. Continue Valcyte 900 mg twice daily initiated 01/29/2013 x3 weeks then reduce to 900 mg daily 8. Chronic anemia/GI bleed. Patient has been transfused. Monitor hemoglobin as well as platelet counts 9. Hypothyroidism. Synthroid 10. PAF. Continue Lanoxin. Cardiac rate control. 11. Diastolic congestive heart failure. Monitor for any signs of fluid overload.  Zachary T. Swartz, MD, FAAPMR [facility]  02/04/2013 

## 2013-02-04 NOTE — PMR Pre-admission (Signed)
PMR Admission Coordinator Pre-Admission Assessment  Patient: [name] is an 79 y.o., female MRN: [id number] DOB: [birth year] Height: 5\' 5"  (165.1 cm) Weight: 80.4 kg (177 lb 4 oz)              Insurance Information HMO:      PPO:       PCP:       IPA:       80/20:       OTHER:   PRIMARY: Medicare A/B      Policy#: [id number]      Subscriber:  [name] CM Name:        Phone#:       Fax#:   Pre-Cert#:        Employer:  Retired Benefits:  Phone #:       Name: Armed forces technical officer. Date:  A=05/24/91 and B=10/22/91     Deduct: $1216(met)      Out of Pocket Max: none      Life Max: unlimited CIR: 100%      SNF: LBD = 12/12/12 and has 100 days SNF Outpatient: 80%     Co-Pay: 20% Home Health: 100%      Co-Pay: none DME: 80%     Co-Pay: 20% Providers: patient's choice  SECONDARY: A[employer]      Policy#: [id number]      Subscriber: [name] CM Name:        Phone#:       Fax#:   Pre-Cert#:        Employer:  Retired Benefits:  Phone #: [phone]     Name:   Eff. 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 right-handed female with recent hospitalization for colitis and colonoscopy showing colon ulcers as well as recently diagnosed with DVT(left common femoral vein, left popliteal vein, posterior tibial and left peroneal vein) on Coumadin therapy as well as history of myasthenia gravis(followed by Dr. Terrace Arabia) on CellCept/prednisone therapy. Admitted 01/24/2013 with heme occult positive and hemoglobin of 7. She complained of  generalized weakness and being lightheaded. She denied any nausea or vomiting. Patient also with recent UTI completing antibiotic therapy. INR on admission of 2.69. Underwent flexible sigmoidoscopy 01/26/2013 per Dr. Elenore Paddy with findings of ulcers/colitis in the sigmoid colon and descending colon. 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 She was placed on Valcyte 01/29/2013 for CMV per infectious disease Dr. Cliffton Asters twice daily x3 weeks then decrease to 900 mg daily. Patient's Coumadin was discontinued in light of suspected GI bleed with IVC filter placed 01/26/2013. Patient has been transfused 2 units of packed red blood cells. Close monitoring of hemoglobin 7.7 which remains stable and platelet counts 120,000. She currently remains on CellCept for myasthenia gravis. She had recent been placed on  prednisone for myasthenia gravis this was discontinued to try to reduce her immunosuppression. Patient with generalized deconditioning with followup physical and occupational therapy with recommendations for physical medicine rehabilitation consult to consider inpatient rehabilitation services. Patient was felt to be a candidate for inpatient rehabilitation services and will be admitted to comprehensive rehabilitation program.   Past Medical History  Past Medical History  Diagnosis Date  . Anemia   . Hyperlipemia   . Hypertension   . Thyroid disease     hypothyroidism  . Anxiety   . Paroxysmal atrial fibrillation   . Vitamin B 12 deficiency   . CHF (congestive heart failure)   . TIA (transient ischemic attack)     4-09  . Microscopic hematuria     benign microscopic hematuria, worked up b Dr Alexis Frock  . Gall stone     porcelain gall bladder with single stone  per CT 02-2009 Dr Birdie Sons  . Osteoporosis     DEXA on 04-30-10  . Myasthenia gravis     sees Dr. Terrace Arabia   . Ulcerative colitis   . DVT of leg (deep venous  thrombosis)     left leg     Family History  family history includes Heart disease in her father.  Prior Rehab/Hospitalizations:  Had HH therapies several years ago after myesthenia gravis illness.   Current Medications  Current facility-administered medications:0.9 %  sodium chloride infusion, , Intravenous, Continuous, Kathlen Mody, MD, Last Rate: 100 mL/hr at 02/04/13 0442;  ALPRAZolam Prudy Feeler) tablet 0.25 mg, 0.25 mg, Oral, BID PRN, Kathlen Mody, MD;  ALPRAZolam Prudy Feeler) tablet 1 mg, 1 mg, Oral, QHS PRN, Kathlen Mody, MD, 1 mg at 02/03/13 2115;  buPROPion (WELLBUTRIN XL) 24 hr tablet 150 mg, 150 mg, Oral, Daily, Kathlen Mody, MD, 150 mg at 02/04/13 1024 digoxin (LANOXIN) tablet 0.125 mg, 0.125 mg, Oral, Daily, Rolan Lipa, NP, 0.125 mg at 02/04/13 1025;  feeding supplement (RESOURCE BREEZE) liquid 1 Container, 1 Container, Oral, BID BM, Lorraine Lax, RD, 1 Container at 02/03/13 1402;  levothyroxine (SYNTHROID, LEVOTHROID) tablet 125 mcg, 125 mcg, Oral, QAC breakfast, Kathlen Mody, MD, 125 mcg at 02/04/13 0744 loperamide (IMODIUM) capsule 2 mg, 2 mg, Oral, BID BM & HS, Rachael Fee, MD, 2 mg at 02/04/13 1024;  multivitamin liquid 5 mL, 5 mL, Oral, Daily, Elyse A Shearer, RD, 5 mL at 02/04/13 1025;  mycophenolate (CELLCEPT) capsule 1,000 mg, 1,000 mg, Oral, BID, Kathlen Mody, MD, 1,000 mg at 02/04/13 1024;  ondansetron (ZOFRAN) injection 4 mg, 4 mg, Intravenous, Q6H PRN, Kathlen Mody, MD ondansetron (ZOFRAN) tablet 4 mg, 4 mg, Oral, Q6H PRN, Kathlen Mody, MD;  pantoprazole (PROTONIX) injection 40 mg, 40 mg, Intravenous, Q24H, Kathlen Mody, MD, 40 mg at 02/04/13 0539;  potassium chloride SA (K-DUR,KLOR-CON) CR tablet 20 mEq, 20 mEq, Oral, Daily, Henderson Cloud, MD, 20 mEq at 02/03/13 0951;  potassium chloride SA (K-DUR,KLOR-CON) CR tablet 40 mEq, 40 mEq, Oral, Q4H, Henderson Cloud, MD, 40 mEq at 02/04/13 1025 sodium chloride 0.9 % injection 10-40 mL, 10-40 mL,  Intracatheter, PRN, Kathlen Mody, MD, 10 mL at 02/03/13 0350;  sodium chloride 0.9 % injection 3 mL, 3 mL, Intravenous, Q12H, Kathlen Mody, MD, 3 mL at 02/03/13 2109;  valGANciclovir (VALCYTE) 450 MG tablet TABS 900 mg, 900 mg, Oral, BID, Cliffton Asters, MD, 900 mg at 02/04/13 1024  Patients Current Diet: Dysphagia  Precautions / Restrictions Precautions Precautions: Fall Precaution Comments: generalized weakness Restrictions Weight Bearing Restrictions:  No   Prior Activity Level Limited Community (1-2x/wk): Went to MD appts, hair appts and to get pedicures.  Home Assistive Devices / Equipment Home Assistive Devices/Equipment: Eyeglasses;Raised toilet seat with rails;Built-in shower seat;Dentures (specify type);Grab bars in shower (upper denture, lower partial) Home Equipment: Walker - 4 wheels  Prior Functional Level Prior Function Level of Independence: Independent with assistive device(s)  Current Functional Level Cognition  Overall Cognitive Status: Within Functional Limits for tasks assessed Orientation Level: Oriented X4    Extremity Assessment (includes Sensation/Coordination)          ADLs  Grooming: Performed;Wash/dry face;Brushing hair;Min guard Where Assessed - Grooming: Unsupported standing Upper Body Dressing: Simulated;Min guard Where Assessed - Upper Body Dressing: Unsupported sitting Lower Body Dressing: Simulated;Maximal assistance Where Assessed - Lower Body Dressing: Supported sit to Pharmacist, hospital: Performed;Minimal assistance Toilet Transfer Method: Sit to stand;Stand pivot Acupuncturist: Bedside commode Toileting - Clothing Manipulation and Hygiene: Performed;Moderate assistance Where Assessed - Toileting Clothing Manipulation and Hygiene: Standing;Sit on 3-in-1 or toilet Transfers/Ambulation Related to ADLs: Extensive conversation regarding benefits of rehab prior to Dc home.  Pt really wants to go home, but during ambulation pts L  knee buckled, as well as feet crossed several times. ADL Comments: Pt agreeable to rehab.    Mobility  Bed Mobility: Sit to Supine Supine to Sit: 5: Supervision;HOB elevated;With rails Sit to Supine: 4: Min assist    Transfers  Transfers: Sit to Stand;Stand to Sit Sit to Stand: 4: Min assist;From bed;From toilet;With armrests;With upper extremity assist Stand to Sit: 4: Min assist;To toilet;To chair/3-in-1;With armrests;With upper extremity assist    Ambulation / Gait / Stairs / Wheelchair Mobility  Ambulation/Gait Ambulation/Gait Assistance: 1: +2 Total assist Ambulation/Gait: Patient Percentage: 80% Ambulation Distance (Feet): 70 Feet Assistive device: Rolling walker Ambulation/Gait Assistance Details: Pt demon mild anxiety with MAX c/o weakness and fear of falling due to B LE weakness. @nd  assist followed with recliner.  Gait Pattern: Step-to pattern;Trunk flexed Gait velocity: decreased    Posture / Balance Static Sitting Balance Static Sitting - Balance Support: No upper extremity supported;Feet supported Static Sitting - Level of Assistance: 7: Independent Static Standing Balance Static Standing - Balance Support: Bilateral upper extremity supported Static Standing - Level of Assistance: 5: Stand by assistance Static Standing - Comment/# of Minutes: pt able to stand for 15 seconds, then fatigued    Special needs/care consideration BiPAP/CPAP No CPM No Continuous Drip IV No Dialysis No         Life Vest No Oxygen No Special Bed No Trach Size No Wound Vac (area) No       Skin Has decubitus ulcer on coccyx with dressings                              Bowel mgmt: Had loose BM 02/04/13 Bladder mgmt: Voiding up on bedside commode Diabetic mgmt No    Previous Home Environment Living Arrangements: Children Available Help at Discharge: Family;Available 24 hours/day Home Layout: Two level;Able to live on main level with bedroom/bathroom Home Access: Level entry Bathroom  Shower/Tub: Health visitor: Handicapped height Home Care Services: No Additional Comments: lives with daughter and SIL, SIL is retired and available to assist as needed  Discharge Living Setting Plans for Discharge Living Setting: House;Lives with (comment) (Lives with Dtr and son in law in a townhouse.) Type of Home at Discharge: House Discharge Home Layout: Two level;Able to live on  main level with bedroom/bathroom Discharge Home Access: Level entry Does the patient have any problems obtaining your medications?: Yes (Describe)  Social/Family/Support Systems Patient Roles: Parent Contact Information: Delfin Edis - Dtr and Milinda Pointer - Dtr Anticipated Caregiver: [name] and son-in-law Anticipated Caregiver's Contact Information: [name] [phone] Ability/Limitations of Caregiver: Dtr [name] works.  Son-in-law not working but is 30 yo and has heart trouble. Caregiver Availability: 24/7 Discharge Plan Discussed with Primary Caregiver: Yes Is Caregiver In Agreement with Plan?: Yes Does Caregiver/Family have Issues with Lodging/Transportation while Pt is in Rehab?: No  Goals/Additional Needs Patient/Family Goal for Rehab: PT/OT mod I/S goals, no ST needs Expected length of stay: 8-12 days Cultural Considerations: From [place].  Lived in [place] for the last 40 yrs. Dietary Needs: Dys 3, thin liquids Equipment Needs: TBD Pt/Family Agrees to Admission and willing to participate: Yes Program Orientation Provided & Reviewed with Pt/Caregiver Including Roles  & Responsibilities: Yes   Decrease burden of Care through IP rehab admission: N/A  Possible need for SNF placement upon discharge: Not likely   Patient Condition: This patient's medical and functional status has changed since the consult dated:  02/01/13 in which the Rehabilitation Physician determined and documented that the patient's condition is appropriate for intensive rehabilitative care in an inpatient  rehabilitation facility. See "History of Present Illness" (above) for medical update. Functional changes are:  Currently ambulating total assist +2 80% 70 ft RW.  Patient's medical and functional status update has been discussed with the Rehabilitation physician and patient remains appropriate for inpatient rehabilitation. Will admit to inpatient rehab today.  Preadmission Screen Completed By:  Trish Mage, 02/04/2013 11:30 AM ______________________________________________________________________   Discussed status with Dr. Riley Kill on 02/04/13 at 1144 and received telephone approval for admission today.  Admission Coordinator:  Trish Mage, time1144/Date09/15/14

## 2013-02-04 NOTE — Discharge Summary (Signed)
Physician Discharge Summary  Jadasia Haws ZOX:[id number] DOB: 1925-04-07 DOA: 01/24/2013  PCP: Nelwyn Salisbury, MD  Admit date: 01/24/2013 Discharge date: 02/04/2013  Time spent: 45 minutes  Recommendations for Outpatient Follow-up:  -To CIR today.   Discharge Diagnoses:  Principal Problem:   CMV colitis Active Problems:   HYPOTHYROIDISM   Myasthenia gravis without exacerbation   HYPERTENSION   Nausea alone   Loss of appetite   DVT of leg (deep venous thrombosis)   Lower GI bleed   Unintentional weight loss   Atrial fibrillation   Discharge Condition: Stable and improved  Filed Weights   01/24/13 1700 01/26/13 0000 01/27/13 1208  Weight: 78.8 kg (173 lb 11.6 oz) 76.5 kg (168 lb 10.4 oz) 80.4 kg (177 lb 4 oz)    History of present illness:  Yaritzel Stange is a 79 y.o. female with recent hospitalization for colitis and a colonoscopy showing Colon ulcers, recently diagnosed with DVT on coumadin, Myasthenia gravis on cellcept, came in for recurrence of diarrhea and a bloody bowel movement today. She was found to be heme occult positive and her hemoglobin 7. She also complained of generalized weakness and almost fainted today. She denies abd pain or nausea or vomiting or hematemesis. She is on vantin for recently diag UTI and has completed 8 days of 10 day course. She is referred to medical service for admission and GI consult from [facility] requested.   Hospital Course:   Chronic Diarrhea and Colonic Ulcers  -Stains from sigmoidoscopy c/w CMV.  -ID has started on valganciclovir.  -Would prefer to avoid stopping her cellcept if at all possible given issues with swallowing and difficulty breathing when she was off of it years back.  -Diarrhea seems to be improving.  -Prednisone has been discontinued.  -No further episodes of bleeding.  Hypokalemia  -Repleting.  -2/2 diarrhea.  -Would continue to check and replete as needed.  Hypotension  -Resolved with IVF.   DVT   -Coumadin discontinued and IVC filter placed.  -No plans to restart anticoagulation given recent GI Bleed.   Myasthenia Gravis  -Continue cell cept and prednisone.   Generalized Weakness/Deconditioning  -To CIR today.   Procedures:  None   Consultations:  GI  ID  Discharge Instructions   Future Appointments Provider Department Dept Phone   10/16/2013 1:30 PM Nilda Riggs, NP [facility] [phone]       Medication List    TAKE these medications       ALPRAZolam 0.25 MG tablet  Commonly known as:  XANAX  Take 0.25 mg by mouth 2 (two) times daily as needed for anxiety.     ALPRAZolam 1 MG tablet  Commonly known as:  XANAX  Take 1 mg by mouth at bedtime as needed for sleep.     atorvastatin 20 MG tablet  Commonly known as:  LIPITOR  Take 20 mg by mouth daily with supper.     buPROPion 150 MG 24 hr tablet  Commonly known as:  WELLBUTRIN XL  Take 1 tablet (150 mg total) by mouth every morning.     co-enzyme Q-10 50 MG capsule  Take 50 mg by mouth at bedtime.     cyanocobalamin 1000 MCG/ML injection  Commonly known as:  (VITAMIN B-12)  Inject 1,000 mcg into the muscle every 30 (thirty) days.     feeding supplement Liqd  Take 237 mLs by mouth daily.     feeding supplement Liqd  Take 1 Container by mouth 2 (two) times  daily between meals.     fish oil-omega-3 fatty acids 1000 MG capsule  Take 1 g by mouth daily.     glucosamine-chondroitin 500-400 MG tablet  Take 1 tablet by mouth 2 (two) times daily.     irbesartan-hydrochlorothiazide 300-12.5 MG per tablet  Commonly known as:  AVALIDE  Take 1 tablet by mouth daily.     levothyroxine 125 MCG tablet  Commonly known as:  SYNTHROID, LEVOTHROID  Take 1 tablet (125 mcg total) by mouth daily.     loperamide 2 MG capsule  Commonly known as:  IMODIUM  Take 1 capsule (2 mg total) by mouth 3 (three) times daily.     mycophenolate 500 MG tablet  Commonly known as:  CELLCEPT   Take 1,000 mg by mouth 2 (two) times daily.     potassium chloride 10 MEQ tablet  Commonly known as:  K-DUR  Take 20 mEq by mouth 2 (two) times daily with a meal.     PROBIOTIC DAILY PO  Take by mouth.     ranitidine 150 MG tablet  Commonly known as:  ZANTAC  Take 150 mg by mouth daily.     vitamin C 250 MG tablet  Commonly known as:  ASCORBIC ACID  Take 250 mg by mouth daily.     warfarin 1 MG tablet  Commonly known as:  COUMADIN  Take 1-2 mg by mouth as directed. Dose was changed today from 5mg  daily to 1mg  alternating with 2mg . Starting with 1mg  dose on 9/5.      ASK your doctor about these medications       cefpodoxime 100 MG tablet  Commonly known as:  VANTIN  Take 100 mg by mouth 2 (two) times daily. A 10 day course.     predniSONE 20 MG tablet  Commonly known as:  DELTASONE  Take 10 mg by mouth daily. End of a taper.       Allergies  Allergen Reactions  . Furosemide Rash    REACTION: unspecified  . Ivp Dye [Iodinated Diagnostic Agents] Rash  . Sulfamethoxazole Rash    REACTION: unspecified      The results of significant diagnostics from this hospitalization (including imaging, microbiology, ancillary and laboratory) are listed below for reference.    Significant Diagnostic Studies: Ir Ivc Filter Plmt / S&i /img Guid/mod Sed  01/26/2013   *RADIOLOGY REPORT*  Indication:  History of lower extremity DVT, now with GI bleed while on anticoagulation  ULTRASOUND GUIDANCE FOR VASCULAR ACCESS IVC CATHETERIZATION AND VENOGRAM IVC FILTER INSERTION  Comparison: CT abdomen pelvis - 04/07/2009  Medications: Fentanyl 100 mcg IV; Versed 1 mg IV  Contrast: 45 mL Omnipaque-300  Sedation time: 15 minutes  Fluoroscopy time: 1 minute, 54 seconds of  Complications: None immediate  PROCEDURE:  Informed consent was obtained from the patient following explanation of the procedure, risks, benefits and alternatives. The patient understands, agrees and consents for the procedure. All  questions were addressed.  A time out was performed prior to the initiation of the procedure.  Maximal barrier sterile technique utilized including caps, mask, sterile gowns, sterile gloves, large sterile drape, hand hygiene, and Betadine prep.  Under sterile condition and local anesthesia, right internal jugular venous access was performed with ultrasound.  An ultrasound image was saved and sent to PACS.  Over a guide wire, the IVC filter delivery sheath and inner dilator were advanced into the IVC just above the IVC bifurcation.  Contrast injection was performed for an IVC venogram.  Through the delivery sheath, a retrievable Denali IVC filter was deployed below the level of the renal veins and above the IVC bifurcation.  Limited post deployment venacavagram was performed.  The delivery sheath was removed and hemostasis was obtained with manual compression.  A dressing was placed.  The patient tolerated the procedure well without immediate post procedural complication.  Findings:  The IVC is patent without evidence of thrombus, stenosis, or occlusion.  No variant venous anatomy.   Successful placement of the IVC filter below the level of the renal veins.  IMPRESSION:  Successful ultrasound and fluoroscopic guided placement of an infrarenal retrievable IVC filter via right jugular approach.   Original Report Authenticated By: Tacey Ruiz, MD   Ir Fluoro Guide Cv Line Right  01/29/2013   *RADIOLOGY REPORT*  TUNNELED CENTRAL VENOUS CATHETER WITH SUBCUTANEOUS RESERVOIR (PORTACATH) PLACEMENT WITH ULTRASOUND AND FLUOROSCOPIC  GUIDANCE  Date: 01/29/2013  Clinical History: 79 year old female with a poor peripheral IV access and frequent need for intravenous medications and peripheral blood draws.  Benefits of PICC versus port placement were discussed with the patient and family.  Portacatheter is preferred secondary to perceives long-term need.  Sedation: Moderate (conscious) sedation was administered during this  procedure.  A total of twomg Versed and 100mg  Fentanyl were administered intravenously.  The patient's vital signs were monitored continuously by radiology nursing throughout the course of the procedure.  Total sedation time: 25 minutes  Fluoroscopy Time: 18 seconds minutes.  Procedure:  The right neck and chest was prepped with chlorhexidine, and draped in the usual sterile fashion using maximum barrier technique (cap and mask, sterile gown, sterile gloves, large sterile sheet, hand hygiene and cutaneous antiseptic).  Antibiotic prophylaxis was provided with 2g Ancef administered IV one hour prior to skin incision.  Local anesthesia was attained by infiltration with 1% lidocaine with epinephrine.  Ultrasound demonstrated patency of the right internal jugular vein, and this was documented with an image.  Under real-time ultrasound guidance, this vein was accessed with a 21 gauge micropuncture needle and image documentation was performed.  A small dermatotomy was made at the access site with an 11 scalpel.  A 0.018" wire was advanced into the SVC and the access needle exchanged for a 3F micropuncture vascular sheath.  The 0.018" wire was then removed and a 0.035" wire advanced into the IVC.  An appropriate location for the subcutaneous reservoir was selected below the clavicle and an incision was made through the skin and underlying soft tissues.  The subcutaneous tissues were then dissected using a combination of blunt and sharp surgical technique and a pocket was formed.  A single lumen power injectable portacatheter was then tunneled through the subcutaneous tissues from the pocket to the dermatotomy and the port reservoir placed within the subcutaneous pocket.  The venous access site was then serially dilated and a peel away vascular sheath placed over the wire.  The wire was removed and the port catheter advanced into position under fluoroscopic guidance. The catheter tip is positioned in the superior cavoatrial  junction. This was documented with a spot image. The portacatheter was then tested and found to flush and aspirate well.  The port was flushed with saline followed by 100 units/mL heparinized saline.  The pocket was then closed in two layers using first subdermal inverted interrupted absorbable sutures followed by a running subcuticular suture.  The epidermis was then sealed with Dermabond. The dermatotomy at the venous access site was also closed with a single inverted  subdermal suture and the epidermis sealed with Dermabond.  Complications:  None.  The patient tolerated the procedure well.  IMPRESSION:  Successful placement of a right IJapproach PowerPort with ultrasound and fluoroscopic guidance.  The catheter is ready for use.  Signed,  Sterling Big, MD Vascular & Interventional Radiologist [facility] Radiology   Original Report Authenticated By: Malachy Moan, M.D.   Ir US Guide Vasc Access Right  01/29/2013   *RADIOLOGY REPORT*  TUNNELED CENTRAL VENOUS CATHETER WITH SUBCUTANEOUS RESERVOIR (PORTACATH) PLACEMENT WITH ULTRASOUND AND FLUOROSCOPIC  GUIDANCE  Date: 01/29/2013  Clinical History: 79 year old female with a poor peripheral IV access and frequent need for intravenous medications and peripheral blood draws.  Benefits of PICC versus port placement were discussed with the patient and family.  Portacatheter is preferred secondary to perceives long-term need.  Sedation: Moderate (conscious) sedation was administered during this procedure.  A total of twomg Versed and 100mg  Fentanyl were administered intravenously.  The patient's vital signs were monitored continuously by radiology nursing throughout the course of the procedure.  Total sedation time: 25 minutes  Fluoroscopy Time: 18 seconds minutes.  Procedure:  The right neck and chest was prepped with chlorhexidine, and draped in the usual sterile fashion using maximum barrier technique (cap and mask, sterile gown, sterile gloves, large sterile  sheet, hand hygiene and cutaneous antiseptic).  Antibiotic prophylaxis was provided with 2g Ancef administered IV one hour prior to skin incision.  Local anesthesia was attained by infiltration with 1% lidocaine with epinephrine.  Ultrasound demonstrated patency of the right internal jugular vein, and this was documented with an image.  Under real-time ultrasound guidance, this vein was accessed with a 21 gauge micropuncture needle and image documentation was performed.  A small dermatotomy was made at the access site with an 11 scalpel.  A 0.018" wire was advanced into the SVC and the access needle exchanged for a 68F micropuncture vascular sheath.  The 0.018" wire was then removed and a 0.035" wire advanced into the IVC.  An appropriate location for the subcutaneous reservoir was selected below the clavicle and an incision was made through the skin and underlying soft tissues.  The subcutaneous tissues were then dissected using a combination of blunt and sharp surgical technique and a pocket was formed.  A single lumen power injectable portacatheter was then tunneled through the subcutaneous tissues from the pocket to the dermatotomy and the port reservoir placed within the subcutaneous pocket.  The venous access site was then serially dilated and a peel away vascular sheath placed over the wire.  The wire was removed and the port catheter advanced into position under fluoroscopic guidance. The catheter tip is positioned in the superior cavoatrial junction. This was documented with a spot image. The portacatheter was then tested and found to flush and aspirate well.  The port was flushed with saline followed by 100 units/mL heparinized saline.  The pocket was then closed in two layers using first subdermal inverted interrupted absorbable sutures followed by a running subcuticular suture.  The epidermis was then sealed with Dermabond. The dermatotomy at the venous access site was also closed with a single inverted  subdermal suture and the epidermis sealed with Dermabond.  Complications:  None.  The patient tolerated the procedure well.  IMPRESSION:  Successful placement of a right IJapproach PowerPort with ultrasound and fluoroscopic guidance.  The catheter is ready for use.  Signed,  Sterling Big, MD Vascular & Interventional Radiologist [facility]   Original Report Authenticated By:  Malachy Moan, M.D.    Microbiology: No results found for this or any previous visit (from the past 240 hour(s)).   Labs: Basic Metabolic Panel:  Recent Labs Lab 01/31/13 0445 02/01/13 1013 02/02/13 0540 02/03/13 0412 02/04/13 0612  NA 137 134* 137 138 138  K 3.2* 3.2* 2.5* 3.4* 3.0*  CL 113* 107 111 113* 110  CO2 18* 17* 18* 18* 20  GLUCOSE 113* 155* 102* 114* 101*  BUN 9 8 6  5* 4*  CREATININE 0.67 0.70 0.69 0.63 0.63  CALCIUM 7.2* 7.6* 7.3* 7.5* 7.3*   Liver Function Tests: No results found for this basename: AST, ALT, ALKPHOS, BILITOT, PROT, ALBUMIN,  in the last 168 hours No results found for this basename: LIPASE, AMYLASE,  in the last 168 hours No results found for this basename: AMMONIA,  in the last 168 hours CBC:  Recent Labs Lab 01/29/13 0425 01/31/13 0610 02/01/13 0500 02/02/13 0540 02/03/13 0412  WBC 6.0 5.2 4.7 4.4 4.5  HGB 9.1* 8.2* 7.6* 7.7* 7.7*  HCT 27.9* 24.9* 23.1* 23.5* 23.1*  MCV 86.4 85.6 85.9 85.5 85.6  PLT 183 115* 111* 115* 120*   Cardiac Enzymes: No results found for this basename: CKTOTAL, CKMB, CKMBINDEX, TROPONINI,  in the last 168 hours BNP: BNP (last 3 results)  Recent Labs  12/08/12 0026  PROBNP 465.5*   CBG:  Recent Labs Lab 02/03/13 2100  GLUCAP 119*       Signed:  HERNANDEZ ACOSTA,Whitnie Deleon  [facility] Pager: [phone] 02/04/2013, 11:08 AM

## 2013-02-04 NOTE — Interval H&P Note (Signed)
[name] was admitted today to Inpatient Rehabilitation with the diagnosis of severe deconditioning/hx of MG.  The patient's history has been reviewed, patient examined, and there is no change in status.  Patient continues to be appropriate for intensive inpatient rehabilitation.  I have reviewed the patient's chart and labs.  Questions were answered to the patient's satisfaction.  [name] T 02/04/2013, 3:42 PM

## 2013-02-04 NOTE — Progress Notes (Signed)
Occupational Therapy Treatment Patient Details Name: Sherell Christoffel MRN: [id number] DOB: [birth year] Today's Date: 02/04/2013 Time: 7829-5621 OT Time Calculation (min): 11 min  OT Assessment / Plan / Recommendation  History of present illness Gigi Onstad is a 79 y.o. female with recent hospitalization for colitis and a colonoscopy showing  Colon ulcers, recently diagnosed with DVT on coumadin, Myasthenia gravis on cellcept, came in for recurrence of diarrhea and a bloody bowel movement today. She was found to be heme occult positive and her hemoglobin 7. She also complained of generalized weakness and almost fainted on day of admission. She denies abd pain or nausea or vomiting or hematemesis. She is on vantin for recently diag UTI and has completed 8 days of 10 day course.Pt with porta cath placement on 01/29/13       Follow Up Recommendations  CIR       Equipment Recommendations  3 in 1 bedside comode       Frequency Min 2X/week   Progress towards OT Goals Progress towards OT goals: Progressing toward goals  Plan Discharge plan needs to be updated    Precautions / Restrictions Precautions Precautions: Fall Precaution Comments: generalized weakness Restrictions Weight Bearing Restrictions: No       ADL  Grooming: Performed;Wash/dry face;Brushing hair;Min guard Where Assessed - Grooming: Unsupported standing Toilet Transfer: Performed;Minimal assistance Toilet Transfer Method: Sit to stand;Stand pivot Acupuncturist: Bedside commode Toileting - Clothing Manipulation and Hygiene: Performed;Moderate assistance Where Assessed - Toileting Clothing Manipulation and Hygiene: Standing;Sit on 3-in-1 or toilet ADL Comments: Pt agreeable to rehab.      OT Goals(current goals can now be found in the care plan section)    Visit Information  Last OT Received On: 02/04/13 Assistance Needed: +1 History of Present Illness: Rainee Sweatt is a 79 y.o. female with  recent hospitalization for colitis and a colonoscopy showing  Colon ulcers, recently diagnosed with DVT on coumadin, Myasthenia gravis on cellcept, came in for recurrence of diarrhea and a bloody bowel movement today. She was found to be heme occult positive and her hemoglobin 7. She also complained of generalized weakness and almost fainted on day of admission. She denies abd pain or nausea or vomiting or hematemesis. She is on vantin for recently diag UTI and has completed 8 days of 10 day course.Pt with porta cath placement on 01/29/13           Cognition  Cognition Arousal/Alertness: Awake/alert Behavior During Therapy: [facility] for tasks assessed/performed Overall Cognitive Status: Within Functional Limits for tasks assessed    Mobility  Bed Mobility Bed Mobility: Sit to Supine Supine to Sit: 5: Supervision;HOB elevated;With rails Transfers Transfers: Sit to Stand;Stand to Sit Sit to Stand: 4: Min assist;From bed;From toilet;With armrests;With upper extremity assist Stand to Sit: 4: Min assist;To toilet;To chair/3-in-1;With armrests;With upper extremity assist          End of Session OT - End of Session Equipment Utilized During Treatment: Gait belt Activity Tolerance: Patient tolerated treatment well Patient left: with call bell/phone within reach;Other (comment) (on toilet. Nurse aware)  GO     Alba Cory 02/04/2013, 9:40 AM

## 2013-02-04 NOTE — H&P (View-Only) (Signed)
Physical Medicine and Rehabilitation Admission H&P    Chief Complaint  Patient presents with  . Abnormal Lab  : HPI: [name] is a 79 y.o. right-handed female with recent hospitalization for colitis and colonoscopy showing colon ulcers as well as recently diagnosed with DVT(left common femoral vein, left popliteal vein, posterior tibial and left peroneal vein) on Coumadin therapy as well as history of myasthenia gravis(followed by Dr. Terrace Arabia) on CellCept/prednisone therapy. Admitted 01/24/2013 with heme occult positive and hemoglobin of 7. She complained of generalized weakness and being lightheaded. She denied any nausea or vomiting. Patient also with recent UTI completing antibiotic therapy. INR on admission of 2.69. Underwent flexible sigmoidoscopy 01/26/2013 per Dr. Elenore Paddy with findings of ulcers/colitis in the sigmoid colon and descending colon. 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 She was placed on Valcyte 01/29/2013 for CMV per infectious disease Dr. Cliffton Asters twice daily x3 weeks then decrease to 900 mg daily. Patient's Coumadin was discontinued in light of suspected GI bleed with IVC filter placed 01/26/2013. Patient has been transfused 2 units of packed red blood cells. Close monitoring of hemoglobin 7.7 which remains stable and platelet counts 120,000. She currently remains on CellCept  for myasthenia gravis. She had recent been placed on prednisone for myasthenia gravis this was discontinued to try to reduce her immunosuppression. Patient with generalized deconditioning with followup physical and occupational therapy with recommendations for physical medicine rehabilitation consult to consider inpatient rehabilitation services. Patient was felt to be a candidate for inpatient rehabilitation services and was admitted for comprehensive rehabilitation program  Review of Systems  Cardiovascular: Positive for  palpitations and leg swelling.  Gastrointestinal: Positive for diarrhea and blood in stool.  Psychiatric/Behavioral:  Anxiety  Remaining review of systems negative.  Past Medical History  Diagnosis Date  . Anemia   . Hyperlipemia   . Hypertension   . Thyroid disease     hypothyroidism  . Anxiety   . Paroxysmal atrial fibrillation   . Vitamin B 12 deficiency   . CHF (congestive heart failure)   . TIA (transient ischemic attack)     4-09  . Microscopic hematuria     benign microscopic hematuria, worked up b Dr Alexis Frock  . Gall stone     porcelain gall bladder with single stone  per CT 02-2009 Dr Birdie Sons  . Osteoporosis     DEXA on 04-30-10  . Myasthenia gravis     sees Dr. Terrace Arabia   . Ulcerative colitis   . DVT of leg (deep venous thrombosis)     left leg    Past Surgical History  Procedure Laterality Date  . Cardiac catheterization      04/1999  . Abdominal hysterectomy      with oophorectomy  . Colonoscopy  1999    normal   . Colonoscopy N/A 12/10/2012    Procedure: COLONOSCOPY;  Surgeon: Iva Boop, MD;  Location: [facility];  Service: Endoscopy;  Laterality: N/A;  . Flexible sigmoidoscopy N/A 01/26/2013    Procedure: FLEXIBLE SIGMOIDOSCOPY;  Surgeon: Iva Boop, MD;  Location: [facility];  Service: Endoscopy;  Laterality: N/A;   Family History  Problem Relation Age of Onset  . Heart disease Father    Social History:  reports that she has never smoked. She has never used smokeless tobacco. She reports that she does not drink alcohol or use illicit drugs. Allergies:  Allergies  Allergen Reactions  . Furosemide Rash  REACTION: unspecified  . Ivp Dye [Iodinated Diagnostic Agents] Rash  . Sulfamethoxazole Rash    REACTION: unspecified   Medications Prior to Admission  Medication Dose Route Frequency Provider Last Rate Last Dose  . [DISCONTINUED] cyanocobalamin injection 1,000 mcg  1,000 mcg Intramuscular Q30 days Nelwyn Salisbury, MD   1,000 mcg  at 08/01/11 1210   Medications Prior to Admission  Medication Sig Dispense Refill  . ALPRAZolam (XANAX) 0.25 MG tablet Take 0.25 mg by mouth 2 (two) times daily as needed for anxiety.      . ALPRAZolam (XANAX) 1 MG tablet Take 1 mg by mouth at bedtime as needed for sleep.      Marland Kitchen atorvastatin (LIPITOR) 20 MG tablet Take 20 mg by mouth daily with supper.       Marland Kitchen buPROPion (WELLBUTRIN XL) 150 MG 24 hr tablet Take 1 tablet (150 mg total) by mouth every morning.  30 tablet  11  . [EXPIRED] cefpodoxime (VANTIN) 100 MG tablet Take 100 mg by mouth 2 (two) times daily. A 10 day course.      Marland Kitchen co-enzyme Q-10 50 MG capsule Take 50 mg by mouth at bedtime.       . cyanocobalamin (,VITAMIN B-12,) 1000 MCG/ML injection Inject 1,000 mcg into the muscle every 30 (thirty) days.      . feeding supplement (ENSURE COMPLETE) LIQD Take 237 mLs by mouth daily.  30 Bottle  0  . feeding supplement (RESOURCE BREEZE) LIQD Take 1 Container by mouth 2 (two) times daily between meals.  60 Container  0  . fish oil-omega-3 fatty acids 1000 MG capsule Take 1 g by mouth daily.       Marland Kitchen glucosamine-chondroitin 500-400 MG tablet Take 1 tablet by mouth 2 (two) times daily.  60 tablet  0  . irbesartan-hydrochlorothiazide (AVALIDE) 300-12.5 MG per tablet Take 1 tablet by mouth daily.  30 tablet  0  . levothyroxine (SYNTHROID, LEVOTHROID) 125 MCG tablet Take 1 tablet (125 mcg total) by mouth daily.  30 tablet  11  . loperamide (IMODIUM) 2 MG capsule Take 1 capsule (2 mg total) by mouth 3 (three) times daily.  90 capsule  0  . mycophenolate (CELLCEPT) 500 MG tablet Take 1,000 mg by mouth 2 (two) times daily.       . potassium chloride (K-DUR) 10 MEQ tablet Take 20 mEq by mouth 2 (two) times daily with a meal.       . [EXPIRED] predniSONE (DELTASONE) 20 MG tablet Take 10 mg by mouth daily. End of a taper.      . Probiotic Product (PROBIOTIC DAILY PO) Take by mouth.      . ranitidine (ZANTAC) 150 MG tablet Take 150 mg by mouth daily.       . vitamin C (ASCORBIC ACID) 250 MG tablet Take 250 mg by mouth daily.      Marland Kitchen warfarin (COUMADIN) 1 MG tablet Take 1-2 mg by mouth as directed. Dose was changed today from 5mg  daily to 1mg  alternating with 2mg . Starting with 1mg  dose on 9/5.        Home: Home Living Family/patient expects to be discharged to:: Private residence Living Arrangements: Children Available Help at Discharge: Family;Available 24 hours/day Home Access: Level entry Home Layout: Two level;Able to live on main level with bedroom/bathroom Home Equipment: Dan Humphreys - 4 wheels Additional Comments: lives with daughter and SIL, SIL is retired and available to assist as needed   Functional History:    Functional Status:  Mobility: Bed Mobility Bed Mobility: Supine to Sit Supine to Sit: 4: Min guard Sit to Supine: 4: Min assist Transfers Transfers: Sit to Stand;Stand to Sit Sit to Stand: 3: Mod assist;With armrests;From bed;From toilet Stand to Sit: 4: Min assist Ambulation/Gait Ambulation/Gait Assistance: 1: +2 Total assist Ambulation/Gait: Patient Percentage: 80% Ambulation Distance (Feet): 70 Feet Assistive device: Rolling walker Ambulation/Gait Assistance Details: Pt demon mild anxiety with MAX c/o weakness and fear of falling due to B LE weakness. @nd  assist followed with recliner.  Gait Pattern: Step-to pattern;Trunk flexed Gait velocity: decreased    ADL: ADL Grooming: Performed;Wash/dry hands;Wash/dry face Where Assessed - Grooming: Unsupported sitting Upper Body Dressing: Simulated;Min guard Where Assessed - Upper Body Dressing: Unsupported sitting Lower Body Dressing: Simulated;Maximal assistance Where Assessed - Lower Body Dressing: Supported sit to stand Toilet Transfer: Performed;Moderate assistance Toilet Transfer Method: Sit to stand Toilet Transfer Equipment: Bedside commode Transfers/Ambulation Related to ADLs: Extensive conversation regarding benefits of rehab prior to Dc home.  Pt  really wants to go home, but during ambulation pts L knee buckled, as well as feet crossed several times. ADL Comments: son in law can help pt with ADL activity as needed  Cognition: Cognition Overall Cognitive Status: Within Functional Limits for tasks assessed Orientation Level: Oriented X4 Cognition Arousal/Alertness: Awake/alert Behavior During Therapy: WFL for tasks assessed/performed Overall Cognitive Status: Within Functional Limits for tasks assessed  Physical Exam: Blood pressure 126/47, pulse 65, temperature 97.3 F (36.3 C), temperature source Oral, resp. rate 18, height 5\' 5"  (1.651 m), weight 80.4 kg (177 lb 4 oz), SpO2 100.00%. Constitutional: She is oriented to person, place, and time. She appears well-developed and well-nourished.  HENT:  Head: Normocephalic and atraumatic.  Eyes: EOM are normal.  Neck: Normal range of motion. Neck supple. No JVD present. No tracheal deviation present. No thyromegaly present.  Cardiovascular: Normal rate and regular rhythm. Exam reveals no friction rub.  No murmur heard.  Pulmonary/Chest: Effort normal and breath sounds normal. No respiratory distress. She has no wheezes. She has no rales.  Abdominal: Soft. Bowel sounds are normal. She exhibits no distension. There is no tenderness. There is no rebound.  Lymphadenopathy:  She has no cervical adenopathy.  Neurological: She is alert and oriented to person, place, and time. No cranial nerve deficit. Coordination normal.  Moved all 4's. No sensory loss distally. Strength 4- deltoid, biceps, triceps, 4/5 HI. LE 3/5 HF, KE 3+, Feet 4/5.  Skin: Skin is warm and dry. Are of superficial breakdown over the sacral breakdown, approx 3x2in and second is 2x2  Both shallow with small amount of fibronecrotic tissue at surface.  Psychiatric: She has a normal mood and affect. Her behavior is normal. Thought content normal.     Results for orders placed during the hospital encounter of 01/24/13 (from  the past 48 hour(s))  BASIC METABOLIC PANEL     Status: Abnormal   Collection Time    02/03/13  4:12 AM      Result Value Range   Sodium 138  135 - 145 mEq/L   Potassium 3.4 (*) 3.5 - 5.1 mEq/L   Comment: NO VISIBLE HEMOLYSIS     DELTA CHECK NOTED   Chloride 113 (*) 96 - 112 mEq/L   CO2 18 (*) 19 - 32 mEq/L   Glucose, Bld 114 (*) 70 - 99 mg/dL   BUN 5 (*) 6 - 23 mg/dL   Creatinine, Ser 0.86  0.50 - 1.10 mg/dL   Calcium 7.5 (*) 8.4 - 10.5  mg/dL   GFR calc non Af Amer 79 (*) >90 mL/min   GFR calc Af Amer >90  >90 mL/min   Comment: (NOTE)     The eGFR has been calculated using the CKD EPI equation.     This calculation has not been validated in all clinical situations.     eGFR's persistently <90 mL/min signify possible Chronic Kidney     Disease.  CBC     Status: Abnormal   Collection Time    02/03/13  4:12 AM      Result Value Range   WBC 4.5  4.0 - 10.5 K/uL   RBC 2.70 (*) 3.87 - 5.11 MIL/uL   Hemoglobin 7.7 (*) 12.0 - 15.0 g/dL   HCT 16.1 (*) 09.6 - 04.5 %   MCV 85.6  78.0 - 100.0 fL   MCH 28.5  26.0 - 34.0 pg   MCHC 33.3  30.0 - 36.0 g/dL   RDW 40.9 (*) 81.1 - 91.4 %   Platelets 120 (*) 150 - 400 K/uL  GLUCOSE, CAPILLARY     Status: Abnormal   Collection Time    02/03/13  9:00 PM      Result Value Range   Glucose-Capillary 119 (*) 70 - 99 mg/dL   Comment 1 Notify RN    BASIC METABOLIC PANEL     Status: Abnormal   Collection Time    02/04/13  6:12 AM      Result Value Range   Sodium 138  135 - 145 mEq/L   Potassium 3.0 (*) 3.5 - 5.1 mEq/L   Chloride 110  96 - 112 mEq/L   CO2 20  19 - 32 mEq/L   Glucose, Bld 101 (*) 70 - 99 mg/dL   BUN 4 (*) 6 - 23 mg/dL   Creatinine, Ser 7.82  0.50 - 1.10 mg/dL   Calcium 7.3 (*) 8.4 - 10.5 mg/dL   GFR calc non Af Amer 79 (*) >90 mL/min   GFR calc Af Amer >90  >90 mL/min   Comment: (NOTE)     The eGFR has been calculated using the CKD EPI equation.     This calculation has not been validated in all clinical situations.      eGFR's persistently <90 mL/min signify possible Chronic Kidney     Disease.   No results found.  Post Admission Physician Evaluation: 1. Functional deficits secondary  to severe deconditioning related to CMV colitis. 2. Patient is admitted to receive collaborative, interdisciplinary care between the physiatrist, rehab nursing staff, and therapy team. 3. Patient's level of medical complexity and substantial therapy needs in context of that medical necessity cannot be provided at a lesser intensity of care such as a SNF. 4. Patient has experienced substantial functional loss from his/her baseline which was documented above under the "Functional History" and "Functional Status" headings.  Judging by the patient's diagnosis, physical exam, and functional history, the patient has potential for functional progress which will result in measurable gains while on inpatient rehab.  These gains will be of substantial and practical use upon discharge  in facilitating mobility and self-care at the household level. 5. Physiatrist will provide 24 hour management of medical needs as well as oversight of the therapy plan/treatment and provide guidance as appropriate regarding the interaction of the two. 6. 24 hour rehab nursing will assist with bladder management, bowel management, safety, skin/wound care, disease management, medication administration, pain management and patient education  and help integrate therapy concepts, techniques,education, etc.  7. PT will assess and treat for/with: Lower extremity strength, range of motion, stamina, balance, functional mobility, safety, adaptive techniques and equipment, pain mgt, education.   Goals are: mod I to supervision. 8. OT will assess and treat for/with: ADL's, functional mobility, safety, upper extremity strength, adaptive techniques and equipment, .   Goals are: mod I/supervision. 9. SLP will assess and treat for/with: n/a.  Goals are: n/a. 10. Case Management and  Social Worker will assess and treat for psychological issues and discharge planning. 11. Team conference will be held weekly to assess progress toward goals and to determine barriers to discharge. 12. Patient will receive at least 3 hours of therapy per day at least 5 days per week. 13. ELOS: 7-10 days       14. Prognosis:  excellent   Medical Problem List and Plan: 1. 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 DVT Prophylaxis/Anticoagulation: Recent findings of left DVT 01/14/2013. Coumadin discontinued suspect GI bleed with IVC filter placed 01/26/2013. 3. Pain Management: Tylenol as needed 4. Mood: Xanax as needed, Wellbutrin 150 mg daily. Provide emotional support 5. Neuropsych: This patient is capable of making decisions on his own behalf. 6. Myasthenia gravis. Continue CellCept as well as Valcyte. Prednisone discontinued to try to help reduce her immunosuppression 7. CMV colitis. Followup per infectious disease. Continue Valcyte 900 mg twice daily initiated 01/29/2013 x3 weeks then reduce to 900 mg daily 8. Chronic anemia/GI bleed. Patient has been transfused. Monitor hemoglobin as well as platelet counts 9. Hypothyroidism. Synthroid 10. PAF. Continue Lanoxin. Cardiac rate control. 11. Diastolic congestive heart failure. Monitor for any signs of fluid overload.  Ranelle Oyster, MD, [facility] Physical Medicine & Rehabilitation  02/04/2013

## 2013-02-04 NOTE — Progress Notes (Signed)
Rehab admissions - I met with patient this am.  She would like inpatient rehab at [facility].  I do have a bed available today and can admit today.  Call me for questions.  #[phone]

## 2013-02-05 ENCOUNTER — Inpatient Hospital Stay (HOSPITAL_COMMUNITY): Payer: Medicare Other | Admitting: Rehabilitation

## 2013-02-05 ENCOUNTER — Inpatient Hospital Stay (HOSPITAL_COMMUNITY): Payer: Medicare Other | Admitting: Occupational Therapy

## 2013-02-05 DIAGNOSIS — D62 Acute posthemorrhagic anemia: Secondary | ICD-10-CM

## 2013-02-05 DIAGNOSIS — A0839 Other viral enteritis: Secondary | ICD-10-CM

## 2013-02-05 DIAGNOSIS — G7000 Myasthenia gravis without (acute) exacerbation: Secondary | ICD-10-CM

## 2013-02-05 DIAGNOSIS — R5381 Other malaise: Secondary | ICD-10-CM

## 2013-02-05 LAB — COMPREHENSIVE METABOLIC PANEL
ALT: 17 U/L (ref 0–35)
AST: 12 U/L (ref 0–37)
Albumin: 1.6 g/dL — ABNORMAL LOW (ref 3.5–5.2)
CO2: 20 mEq/L (ref 19–32)
Calcium: 7.5 mg/dL — ABNORMAL LOW (ref 8.4–10.5)
GFR calc non Af Amer: 76 mL/min — ABNORMAL LOW (ref >90)
Sodium: 138 mEq/L (ref 135–145)
Total Protein: 3.8 g/dL — ABNORMAL LOW (ref 6.0–8.3)

## 2013-02-05 LAB — CBC WITH DIFFERENTIAL/PLATELET
Basophils Absolute: 0 10*3/uL (ref 0.0–0.1)
Basophils Relative: 0 % (ref 0–1)
Eosinophils Relative: 3 % (ref 0–5)
Lymphocytes Relative: 12 % (ref 12–46)
MCHC: 33.3 g/dL (ref 30.0–36.0)
MCV: 85.4 fL (ref 78.0–100.0)
Platelets: 131 10*3/uL — ABNORMAL LOW (ref 150–400)
RDW: 17.1 % — ABNORMAL HIGH (ref 11.5–15.5)
WBC: 3.5 10*3/uL — ABNORMAL LOW (ref 4.0–10.5)

## 2013-02-05 MED ORDER — POTASSIUM CHLORIDE CRYS ER 20 MEQ PO TBCR
20.0000 meq | EXTENDED_RELEASE_TABLET | Freq: Two times a day (BID) | ORAL | Status: DC
  Administered 2013-02-05 – 2013-02-06 (×3): 20 meq via ORAL
  Filled 2013-02-05 (×7): qty 1

## 2013-02-05 MED ORDER — TRAZODONE HCL 50 MG PO TABS: 50.0000 mg | ORAL_TABLET | Freq: Every evening | ORAL | Status: DC | PRN

## 2013-02-05 MED ORDER — SACCHAROMYCES BOULARDII 250 MG PO CAPS
250.0000 mg | ORAL_CAPSULE | Freq: Two times a day (BID) | ORAL | Status: DC
  Administered 2013-02-05 – 2013-02-09 (×9): 250 mg via ORAL
  Filled 2013-02-05 (×13): qty 1

## 2013-02-05 NOTE — Progress Notes (Signed)
INITIAL NUTRITION ASSESSMENT  DOCUMENTATION CODES Per approved criteria  -Obesity Unspecified   INTERVENTION: 1.  Supplements; Resource Breeze po BID, each supplement provides 250 kcal and 9 grams of protein. 2.  MVI; continue MVI daily  NUTRITION DIAGNOSIS: Unintentional wt loss related to diarrhea/decreased PO intake as evidenced by 12% wt loss in less than 2 months.    Monitor:  1.  Food/Beverage; pt meeting >/=90% estimated needs with tolerance. 2.  Wt/wt change; monitor trends 3.  Gastrointestinal; monitor for bowel function  Reason for Assessment: MST  79 y.o. female  Admitting Dx: Lower GI Bleed  ASSESSMENT: 79 y.o. female with recent hospitalization for colitis and a colonoscopy showing Colon ulcers, recently diagnosed with DVT on coumadin, Myasthenia gravis on cellcept.  Pt states that her appetite has been better and she has been desiring healthier foods.  She has continued her low fiber diet which we reviewed together.  Pt with overall wt loss, however recent improvement to 192 lbs.  Some wt loss may be attributable to significant diarrhea and dehydration.  Continue current interventions in place and monitor wt and intake for adequacy.    Height: Ht Readings from Last 1 Encounters:  02/04/13 5\' 6"  (1.676 m)    Weight: Wt Readings from Last 1 Encounters:  02/04/13 192 lb 0.3 oz (87.1 kg)    Ideal Body Weight: 125 lbs  % Ideal Body Weight: 138%  Wt Readings from Last 10 Encounters:  02/04/13 192 lb 0.3 oz (87.1 kg)  01/27/13 177 lb 4 oz (80.4 kg)  01/27/13 177 lb 4 oz (80.4 kg)  01/22/13 174 lb (78.926 kg)  01/11/13 175 lb 8 oz (79.606 kg)  12/31/12 184 lb 9.6 oz (83.734 kg)  12/21/12 188 lb (85.276 kg)  12/12/12 196 lb 10.4 oz (89.2 kg)  12/12/12 196 lb 10.4 oz (89.2 kg)  12/07/12 195 lb (88.451 kg)    Usual Body Weight: 220 lbs  % Usual Body Weight: 87%  BMI:  Body mass index is 31.01 kg/(m^2).  Estimated Nutritional Needs: Kcal:  1600-1800 Protein: 90-100 grams Fluid: 2.3 L/day  Skin: +1 RLE edema, +2 LLE edema; excoriated buttocks  Diet Order: Dysphagia 3, thin  EDUCATION NEEDS: -No education needs identified at this time   Intake/Output Summary (Last 24 hours) at 02/05/13 1152 Last data filed at 02/05/13 0730  Gross per 24 hour  Intake    480 ml  Output     10 ml  Net    470 ml    Last BM: 9/16  Labs:   Recent Labs Lab 02/03/13 0412 02/04/13 0612 02/05/13 0500  NA 138 138 138  K 3.4* 3.0* 3.0*  CL 113* 110 108  CO2 18* 20 20  BUN 5* 4* 6  CREATININE 0.63 0.63 0.72  CALCIUM 7.5* 7.3* 7.5*  GLUCOSE 114* 101* 100*    CBG (last 3)   Recent Labs  02/03/13 2100  GLUCAP 119*    Scheduled Meds: . buPROPion  150 mg Oral Daily  . digoxin  0.125 mg Oral Daily  . feeding supplement  1 Container Oral BID BM  . levothyroxine  125 mcg Oral QAC breakfast  . loperamide  2 mg Oral BID BM & HS  . multivitamin  5 mL Oral Daily  . mycophenolate  1,000 mg Oral BID  . pantoprazole  40 mg Oral BID  . potassium chloride  20 mEq Oral BID  . saccharomyces boulardii  250 mg Oral BID  . valGANciclovir  900  mg Oral BID    Continuous Infusions:    Past Medical History  Diagnosis Date  . Anemia   . Hyperlipemia   . Hypertension   . Thyroid disease     hypothyroidism  . Anxiety   . Paroxysmal atrial fibrillation   . Vitamin B 12 deficiency   . CHF (congestive heart failure)   . TIA (transient ischemic attack)     4-09  . Microscopic hematuria     benign microscopic hematuria, worked up b Dr Alexis Frock  . Gall stone     porcelain gall bladder with single stone  per CT 02-2009 Dr Birdie Sons  . Osteoporosis     DEXA on 04-30-10  . Myasthenia gravis     sees Dr. Terrace Arabia   . Ulcerative colitis   . DVT of leg (deep venous thrombosis)     left leg     Past Surgical History  Procedure Laterality Date  . Cardiac catheterization      04/1999  . Abdominal hysterectomy      with  oophorectomy  . Colonoscopy  1999    normal   . Colonoscopy N/A 12/10/2012    Procedure: COLONOSCOPY;  Surgeon: Iva Boop, MD;  Location: [facility];  Service: Endoscopy;  Laterality: N/A;  . Flexible sigmoidoscopy N/A 01/26/2013    Procedure: FLEXIBLE SIGMOIDOSCOPY;  Surgeon: Iva Boop, MD;  Location: [facility];  Service: Endoscopy;  Laterality: N/A;    Loyce Dys, MS RD LDN Clinical Inpatient Dietitian Pager: [phone] Weekend/After hours pager: [phone]

## 2013-02-05 NOTE — Progress Notes (Signed)
Subjective/Complaints: Had some difficulties falling asleep last night. Just restless more than anything else. Denies pain.  A 12 point review of systems has been performed and if not noted above is otherwise negative.   Objective: Vital Signs: Blood pressure 123/70, pulse 70, temperature 98.1 F (36.7 C), temperature source Oral, resp. rate 20, height 5\' 6"  (1.676 m), weight 87.1 kg (192 lb 0.3 oz), SpO2 99.00%. No results found.  Recent Labs  02/03/13 0412 02/05/13 0500  WBC 4.5 3.5*  HGB 7.7* 7.8*  HCT 23.1* 23.4*  PLT 120* 131*    Recent Labs  02/04/13 0612 02/05/13 0500  NA 138 138  K 3.0* 3.0*  CL 110 108  GLUCOSE 101* 100*  BUN 4* 6  CREATININE 0.63 0.72  CALCIUM 7.3* 7.5*   CBG (last 3)   Recent Labs  02/03/13 2100  GLUCAP 119*    Wt Readings from Last 3 Encounters:  02/04/13 87.1 kg (192 lb 0.3 oz)  01/27/13 80.4 kg (177 lb 4 oz)  01/27/13 80.4 kg (177 lb 4 oz)    Physical Exam:  Constitutional: She is oriented to person, place, and time. She appears well-developed and well-nourished.  HENT:  Head: Normocephalic and atraumatic.  Eyes: EOM are normal.  Neck: Normal range of motion. Neck supple. No JVD present. No tracheal deviation present. No thyromegaly present.  Cardiovascular: Normal rate and regular rhythm. Small systolic murmur No murmur heard.  Pulmonary/Chest: Effort normal and breath sounds normal. No respiratory distress. She has no wheezes. She has no rales.  Abdominal: Soft. Bowel sounds are normal. She exhibits no distension. There is no tenderness. There is no rebound.  Lymphadenopathy:  She has no cervical adenopathy.  Neurological: She is alert and oriented to person, place, and time. No cranial nerve deficit. Coordination normal.  Moved all 4's. No sensory loss distally. Strength 4- deltoid, biceps, triceps, 4/5 HI. LE 3/5 HF, KE 3+, Feet 4/5.  Skin: Skin is warm and dry. Are of superficial breakdown over the sacral  region/buttocks, approx 3x2in and second is 2x2 Both shallow with small amount of fibronecrotic tissue at surface.  Psychiatric: She has a normal mood and affect. Her behavior is normal. Thought content normal   Assessment/Plan: 1. 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 Physiatrist is providing close team supervision and 24 hour management of active medical problems listed below. Physiatrist and rehab team continue to assess barriers to discharge/monitor patient progress toward functional and medical goals. FIM:       FIM - Toileting Toileting steps completed by patient: Adjust clothing prior to toileting;Performs perineal hygiene;Adjust clothing after toileting Toileting Assistive Devices: Grab bar or rail for support Toileting: 5: Supervision: Safety issues/verbal cues                         Medical Problem List and Plan:  1. 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 DVT Prophylaxis/Anticoagulation: Recent findings of left DVT 01/14/2013. Coumadin discontinued suspect GI bleed with IVC filter placed 01/26/2013.  3. Pain Management: Tylenol as needed  4. Mood: Xanax as needed, Wellbutrin 150 mg daily. Providing emotional support   -prn trazodone to help with sleep 5. Neuropsych: This patient is capable of making decisions on his own behalf.  6. Myasthenia gravis. Continue CellCept as well as Valcyte. 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 Continue Valcyte 900 mg twice daily initiated 01/29/2013 x3 weeks then reduce to 900 mg daily   -continue to work on bowel pattern/stool consistency, imodium, probiotic  -replace potassium 8. Chronic anemia/GI bleed. Patient has been transfused. Monitor hemoglobin as well as platelet counts  9. Hypothyroidism.  Synthroid  10. PAF. Continue Lanoxin. Cardiac rate controlled.  11. Diastolic congestive heart failure. Monitor for any signs of fluid overload. 12. Anemia: hgb 7.8, continue to follow, hx of likely GI bleed on coumadin before   LOS (Days) 1 A FACE TO FACE EVALUATION WAS PERFORMED  [name] T 02/05/2013 8:34 AM

## 2013-02-05 NOTE — Progress Notes (Signed)
Occupational Therapy Session Note  Patient Details  Name: [name] MRN: [id number] Date of Birth: [birth year]  Today's Date: 02/05/2013 Time: 5284-1324 Time Calculation (min): 45 min  Skilled Therapeutic Interventions/Progress Updates:    1:1 focus on overall strengthening and activity tolerance. Performing NuStep for 8 min on level 9 with one rest break; sit to stands, standing balance on foam with eyes open requiring bilateral UE support, marching in sitting and standing (standing with UE support), functional ambulation with RW, bed mobility requiring A for bilateral LEs to get into the bed.   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

## 2013-02-05 NOTE — Evaluation (Signed)
Physical Therapy Assessment and Plan  Patient Details  Name: [name] MRN: [id number] Date of Birth: [birth year]  PT Diagnosis: Abnormal posture, Abnormality of gait, Coordination disorder, Difficulty walking, Edema and Muscle weakness Rehab Potential: Good ELOS: one week   Today's Date: 02/05/2013 Time: 1106-1203 Time Calculation (min): 57 min  Problem List:  Patient Active Problem List   Diagnosis Date Noted  . Physical deconditioning 02/04/2013  . Atrial fibrillation 01/30/2013  . Unintentional weight loss 01/29/2013  . Lower GI bleed 01/24/2013  . DVT of leg (deep venous thrombosis) 01/23/2013  . Nausea alone 01/14/2013  . Loss of appetite 01/14/2013  . Malnutrition of moderate degree 12/10/2012  . 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 Hypokalemia 12/07/2012  . Acute renal failure 12/07/2012  . VITAMIN B12 DEFICIENCY 02/22/2010  . ANXIETY 12/26/2007  . Myasthenia gravis without exacerbation 12/26/2007  . TIA 12/26/2007  . HYPOTHYROIDISM 11/10/2006  . HYPERLIPIDEMIA 11/10/2006  . ANEMIA-NOS 11/10/2006  . HYPERTENSION 11/10/2006    Past Medical History:  Past Medical History  Diagnosis Date  . Anemia   . Hyperlipemia   . Hypertension   . Thyroid disease     hypothyroidism  . Anxiety   . Paroxysmal atrial fibrillation   . Vitamin B 12 deficiency   . CHF (congestive heart failure)   . TIA (transient ischemic attack)     4-09  . Microscopic hematuria     benign microscopic hematuria, worked up b Dr Alexis Frock  . Gall stone     porcelain gall bladder with single stone  per CT 02-2009 Dr Birdie Sons  . Osteoporosis     DEXA on 04-30-10  . Myasthenia gravis     sees Dr. Terrace Arabia   . Ulcerative colitis   . DVT of leg (deep venous thrombosis)     left leg    Past Surgical History:  Past Surgical History  Procedure Laterality Date  . Cardiac catheterization      04/1999  . Abdominal hysterectomy      with oophorectomy  . Colonoscopy  1999    normal   .  Colonoscopy N/A 12/10/2012    Procedure: COLONOSCOPY;  Surgeon: Iva Boop, MD;  Location: [facility];  Service: Endoscopy;  Laterality: N/A;  . Flexible sigmoidoscopy N/A 01/26/2013    Procedure: FLEXIBLE SIGMOIDOSCOPY;  Surgeon: Iva Boop, MD;  Location: [facility];  Service: Endoscopy;  Laterality: N/A;    Assessment & Plan Clinical Impression: Patient is a 79 y.o. year right-handed female with recent hospitalization for colitis and colonoscopy showing colon ulcers as well as recently diagnosed with DVT(left common femoral vein, left popliteal vein, posterior tibial and left peroneal vein) on Coumadin therapy as well as history of myasthenia gravis(followed by Dr. Terrace Arabia) on CellCept/prednisone therapy. Admitted 01/24/2013 with heme occult positive and hemoglobin of 7. She complained of generalized weakness and being lightheaded. She denied any nausea or vomiting. Patient also with recent UTI completing antibiotic therapy. INR on admission of 2.69. Underwent flexible sigmoidoscopy 01/26/2013 per Dr. Elenore Paddy with findings of ulcers/colitis in the sigmoid colon and descending colon. 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 She was placed on Valcyte 01/29/2013 for CMV per infectious disease Dr. Cliffton Asters twice daily x3 weeks then decrease to 900 mg daily. Patient's Coumadin was discontinued in light of suspected GI bleed with IVC filter placed 01/26/2013. Patient has been transfused 2 units of packed red blood cells. Close monitoring of hemoglobin  7.7 which remains stable and platelet counts 120,000. She currently remains on CellCept for myasthenia gravis. She had recent been placed on prednisone for myasthenia gravis this was discontinued to try to reduce her immunosuppression.  Patient transferred to CIR on 02/04/2013 .   Patient currently requires min with mobility secondary to muscle weakness and decreased  cardiorespiratoy endurance.  Prior to hospitalization, patient was modified independent  with mobility and lived with Daughter;Other (Comment) (son in law) in a House home.  Home access is  Level entry (small threshold).  Patient will benefit from skilled PT intervention to maximize safe functional mobility, minimize fall risk and decrease caregiver burden for planned discharge home with 24 hour supervision.  Anticipate patient will benefit from follow up HH at discharge.  PT - End of Session Activity Tolerance: Tolerates 10 - 20 min activity with multiple rests Endurance Deficit: Yes Endurance Deficit Description: Pt with increased dizziness and fatigue with continuous mobility or upright balance activities.  RN notified of dizziness, however no change in vitals.  PT Assessment Rehab Potential: Good PT Patient demonstrates impairments in the following area(s): Balance;Endurance;Motor;Safety;Skin Integrity PT Transfers Functional Problem(s): Bed Mobility;Bed to Chair;Car;Furniture PT Locomotion Functional Problem(s): Ambulation;Stairs;Wheelchair Mobility PT Plan PT Intensity: Minimum of 1-2 x/day ,45 to 90 minutes PT Frequency: 5 out of 7 days PT Duration Estimated Length of Stay: one week PT Treatment/Interventions: Ambulation/gait training;Balance/vestibular training;Discharge planning;DME/adaptive equipment instruction;Functional mobility training;Neuromuscular re-education;Patient/family education;Skin care/wound management;Stair training;Therapeutic Activities;Therapeutic Exercise;UE/LE Strength taining/ROM;UE/LE Coordination activities;Wheelchair propulsion/positioning PT Transfers Anticipated Outcome(s): Mod I PT Locomotion Anticipated Outcome(s): Mod I PT Recommendation Follow Up Recommendations: Home health PT;24 hour supervision/assistance Patient destination: Home Equipment Recommended: 3 in 1 bedside comode;Rolling walker with 5" wheels  Skilled Therapeutic  Intervention Performed bed mobility in room without use of handrail w/ HOB flat at min assist level for LEs into bed, however provided cues for technique to require less assist from therapist.  She was able to get into sitting position at supervision without use of handrails.  Performed first stand pivot transfer with her rollator, however noticed that when backing up, walker gets away from her and she cannot control steps backwards.  Transitioned to RW rest of evaluation with noted improvement in balance.  Performed 10' ambulation with RW at min/guard to min assist level with cues for upright posture, maintaining position inside of RW and also for taking wider step on L (she is aware that L step is narrow).  Performed stairs, see details below, at min assist level.  Performed w/c mobility at supervision level with cues for technique and larger arm movements for better propulsion.  Assisted back to room in w/c and assisted back to bed with education on pressure relief (rolling to side) following eating lunch to decrease further skin breakdown at sacrum.   Also provided pt with air cushion for w/c.  Pt able to state that she needs to call for help and not get OOB alone.  RN made aware of dizziness.   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 which she atributes LLE weakness to Restrictions Weight Bearing Restrictions: No General Chart Reviewed: Yes Family/Caregiver Present: No Vital Signs  Pain Pain Assessment Pain Assessment: No/denies pain Pain Score: 0-No pain Home Living/Prior Functioning Home Living Available Help at Discharge: Family;Available 24 hours/day Type of Home: House Home Access: Level entry (small threshold) Home Layout: Two level;Able to live on main level with bedroom/bathroom Additional Comments: has built in seat in shower,  grab bars around toilet, uses 4 wheeled walker  Lives  With: Daughter;Other (Comment) (son in law) Prior Function Level of Independence: Independent with homemaking with ambulation;Independent with basic ADLs;Independent with gait;Independent with transfers (with use of rollator)  Able to Take Stairs?: Yes (daughter has small steps to garage) Driving: No Vocation: Retired Leisure: Hobbies-yes (Comment) Comments: read, watch tv, play computer game Vision/Perception  Vision - History Baseline Vision: Wears glasses all the time Visual History: Other (comment) Patient Visual Report: No change from baseline Vision - Assessment Eye Alignment: Within Functional Limits Perception Perception: Within Functional Limits Praxis Praxis: Intact  Cognition Overall Cognitive Status: Within Functional Limits for tasks assessed Orientation Level: Oriented X4 Attention: Sustained Sustained Attention: Appears intact Memory: Appears intact Awareness: Appears intact Problem Solving: Appears intact Safety/Judgment: Appears intact Sensation Sensation Light Touch: Appears Intact (does state diminished sensation in LLE) Stereognosis: Not tested Hot/Cold: Appears Intact Proprioception: Appears Intact Coordination Gross Motor Movements are Fluid and Coordinated: Yes Heel Shin Test: no dysmetria noted, smooth but slower movements.  Motor  Motor Motor: Abnormal postural alignment and control Motor - Skilled Clinical Observations: Pt presents with generalized weakness (L >R) and also with forward flexed posture during upright mobility.   Mobility Bed Mobility Bed Mobility: Supine to Sit;Sit to Supine Supine to Sit: 4: Min assist;HOB flat Supine to Sit Details: Manual facilitation for placement;Verbal cues for technique;Verbal cues for precautions/safety Sit to Supine: 5: Supervision;HOB flat Sit to Supine - Details: Verbal cues for technique;Verbal cues for precautions/safety Transfers Transfers: Yes Sit to Stand: 4: Min assist;From bed;From  chair/3-in-1;With upper extremity assist Sit to Stand Details: Verbal cues for technique;Verbal cues for precautions/safety;Manual facilitation for weight shifting Stand to Sit: 4: Min assist;To chair/3-in-1;With armrests;With upper extremity assist;To bed Stand to Sit Details (indicate cue type and reason): Verbal cues for technique;Verbal cues for sequencing;Manual facilitation for weight shifting;Manual facilitation for placement Locomotion  Ambulation Ambulation: Yes Ambulation/Gait Assistance: 3: Mod assist;4: Min assist Ambulation Distance (Feet): 87 Feet (and another 6' w/o AD, and 5' x 2 with rollator) Assistive device: Rolling walker;Rollator;1 person hand held assist Ambulation/Gait Assistance Details: Manual facilitation for weight shifting;Verbal cues for safe use of DME/AE;Verbal cues for sequencing;Verbal cues for technique;Verbal cues for gait pattern;Verbal cues for precautions/safety;Tactile cues for posture Gait Gait: Yes Gait Pattern: Impaired Gait Pattern: Step-to pattern;Lateral hip instability;Narrow base of support;Trendelenburg;Decreased weight shift to left Gait velocity: decreased Stairs / Additional Locomotion Stairs: Yes Stairs Assistance: 4: Min assist Stairs Assistance Details: Verbal cues for sequencing;Verbal cues for technique;Verbal cues for precautions/safety;Tactile cues for weight shifting;Tactile cues for sequencing Stair Management Technique: Two rails Number of Stairs: 5 Wheelchair Mobility Distance: 17'  Trunk/Postural Assessment  Cervical Assessment Cervical Assessment: Within Functional Limits Thoracic Assessment Thoracic Assessment: Within Functional Limits Lumbar Assessment Lumbar Assessment: Within Functional Limits Postural Control Postural Control: Deficits on evaluation Postural Limitations: Pt with overall forward flexed posture, esp during upright mobility.   Balance Balance Balance Assessed: Yes Static Sitting Balance Static  Sitting - Level of Assistance: 7: Independent Static Standing Balance Static Standing - Balance Support: Bilateral upper extremity supported Static Standing - Level of Assistance: 5: Stand by assistance Dynamic Standing Balance Dynamic Standing - Balance Support: No upper extremity supported Dynamic Standing - Level of Assistance: 4: Min assist Dynamic Standing - Balance Activities: Lateral lean/weight shifting;Forward lean/weight shifting;Reaching for objects Extremity Assessment  RUE Assessment RUE Assessment: Within Functional Limits RUE AROM (degrees) Overall AROM Right Upper Extremity: Within functional limits for tasks performed RUE Strength  RUE Overall Strength: Within Functional Limits for tasks performed RUE Tone RUE Tone: Within Functional Limits LUE Assessment LUE Assessment: Within Functional Limits LUE AROM (degrees) Overall AROM Left Upper Extremity: Within functional limits for tasks assessed LUE Strength LUE Overall Strength: Within Functional Limits for tasks assessed LUE Tone LUE Tone: Within Functional Limits RLE Assessment RLE Assessment: Exceptions to [facility] RLE Strength RLE Overall Strength: Deficits RLE Overall Strength Comments: Pt overall WFL, however hip flexors are approx 3+/5 LLE Assessment LLE Assessment: Exceptions to [facility] LLE Strength LLE Overall Strength: Deficits LLE Overall Strength Comments: Pt grossly WFL, however hip flexors are 3-3+/5  FIM:  FIM - Bed/Chair Transfer Bed/Chair Transfer Assistive Devices: Therapist, occupational: 5: Supine > Sit: Supervision (verbal cues/safety issues);4: Sit > Supine: Min A (steadying pt. > 75%/lift 1 leg);4: Bed > Chair or W/C: Min A (steadying Pt. > 75%);4: Chair or W/C > Bed: Min A (steadying Pt. > 75%) FIM - Locomotion: Wheelchair Distance: 80' Locomotion: Wheelchair: 2: Travels 50 - 149 ft with supervision, cueing or coaxing FIM - Locomotion: Ambulation Locomotion: Ambulation Assistive Devices:  Designer, industrial/product (and rollator and without AD) Ambulation/Gait Assistance: 3: Mod assist;4: Min assist Locomotion: Ambulation: 2: Travels 50 - 149 ft with minimal assistance (Pt.>75%) FIM - Locomotion: Stairs Locomotion: Building control surveyor: Hand rail - 2 Locomotion: Stairs: 2: Up and Down 4 - 11 stairs with minimal assistance (Pt.>75%)   Refer to Care Plan for Long Term Goals  Recommendations for other services: None  Discharge Criteria: Patient will be discharged from PT if patient refuses treatment 3 consecutive times without medical reason, if treatment goals not met, if there is a change in medical status, if patient makes no progress towards goals or if patient is discharged from hospital.  The above assessment, treatment plan, treatment alternatives and goals were discussed and mutually agreed upon: by patient  Vista Deck 02/05/2013, 12:53 PM

## 2013-02-05 NOTE — Evaluation (Signed)
Occupational Therapy Assessment and Plan  Patient Details  Name: [name] MRN: [id number] Date of Birth: November 24, 1924  OT Diagnosis: muscle weakness (generalized) and swelling of limb Rehab Potential: Rehab Potential: Excellent ELOS: 1 week   Today's Date: 02/05/2013 Time:  0900-  1000     Problem List:  Patient Active Problem List   Diagnosis Date Noted  . Physical deconditioning 02/04/2013  . Atrial fibrillation 01/30/2013  . Unintentional weight loss 01/29/2013  . Lower GI bleed 01/24/2013  . DVT of leg (deep venous thrombosis) 01/23/2013  . Nausea alone 01/14/2013  . Loss of appetite 01/14/2013  . Malnutrition of moderate degree 12/10/2012  . 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 Hypokalemia 12/07/2012  . Acute renal failure 12/07/2012  . VITAMIN B12 DEFICIENCY 02/22/2010  . ANXIETY 12/26/2007  . Myasthenia gravis without exacerbation 12/26/2007  . TIA 12/26/2007  . HYPOTHYROIDISM 11/10/2006  . HYPERLIPIDEMIA 11/10/2006  . ANEMIA-NOS 11/10/2006  . HYPERTENSION 11/10/2006    Past Medical History:  Past Medical History  Diagnosis Date  . Anemia   . Hyperlipemia   . Hypertension   . Thyroid disease     hypothyroidism  . Anxiety   . Paroxysmal atrial fibrillation   . Vitamin B 12 deficiency   . CHF (congestive heart failure)   . TIA (transient ischemic attack)     4-09  . Microscopic hematuria     benign microscopic hematuria, worked up b Dr Alexis Frock  . Gall stone     porcelain gall bladder with single stone  per CT 02-2009 Dr Birdie Sons  . Osteoporosis     DEXA on 04-30-10  . Myasthenia gravis     sees Dr. Terrace Arabia   . Ulcerative colitis   . DVT of leg (deep venous thrombosis)     left leg    Past Surgical History:  Past Surgical History  Procedure Laterality Date  . Cardiac catheterization      04/1999  . Abdominal hysterectomy      with oophorectomy  . Colonoscopy  1999    normal   . Colonoscopy N/A 12/10/2012    Procedure: COLONOSCOPY;  Surgeon:  Iva Boop, MD;  Location: [facility];  Service: Endoscopy;  Laterality: N/A;  . Flexible sigmoidoscopy N/A 01/26/2013    Procedure: FLEXIBLE SIGMOIDOSCOPY;  Surgeon: Iva Boop, MD;  Location: [facility];  Service: Endoscopy;  Laterality: N/A;    Assessment & Plan Clinical Impression: [name] is a 79 y.o. right-handed female with recent hospitalization for colitis and colonoscopy showing colon ulcers as well as recently diagnosed with DVT(left common femoral vein, left popliteal vein, posterior tibial and left peroneal vein) on Coumadin therapy as well as history of myasthenia gravis(followed by Dr. Terrace Arabia) on CellCept/prednisone therapy. Admitted 01/24/2013 with heme occult positive and hemoglobin of 7. She complained of generalized weakness and being lightheaded. She denied any nausea or vomiting. Patient also with recent UTI completing antibiotic therapy. INR on admission of 2.69. Underwent flexible sigmoidoscopy 01/26/2013 per Dr. Elenore Paddy with findings of ulcers/colitis in the sigmoid colon and descending colon. 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 She was placed on Valcyte 01/29/2013 for CMV per infectious disease Dr. Cliffton Asters twice daily x3 weeks then decrease to 900 mg daily. Patient's Coumadin was discontinued in light of suspected GI bleed with IVC filter placed 01/26/2013. Patient has been transfused 2 units of packed red blood cells. Close monitoring of hemoglobin 7.7 which remains  stable and platelet counts 120,000. She currently remains on CellCept for myasthenia gravis. She had recent been placed on prednisone for myasthenia gravis this was discontinued to try to reduce her immunosuppression. Patient with generalized deconditioning with followup physical and occupational therapy with recommendations for physical medicine rehabilitation consult to consider inpatient rehabilitation services.  Patient was felt to be a candidate for inpatient rehabilitation services and was admitted for comprehensive rehabilitation program  Patient transferred to CIR on 02/04/2013 .    Patient currently requires min A with basic self-care skills secondary to muscle weakness,decreased endurance, edema in LE's.  Prior to hospitalization, patient could complete BADL's with modified independent .  Patient will benefit from skilled intervention to decrease level of assist with basic self-care skills prior to discharge home with care partner.  Anticipate patient will require intermittent supervision and follow up home health.  OT - End of Session Endurance Deficit: Yes OT Assessment Rehab Potential: Excellent OT Patient demonstrates impairments in the following area(s): Endurance;Balance;Edema OT Basic ADL's Functional Problem(s): Grooming;Bathing;Dressing;Toileting OT Transfers Functional Problem(s): Toilet;Tub/Shower OT Plan OT Duration/Estimated Length of Stay: 1 week OT Treatment/Interventions: Warden/ranger;Therapeutic Exercise;UE/LE Coordination activities;Self Care/advanced ADL retraining;UE/LE Strength taining/ROM;Therapeutic Activities;Functional mobility training;DME/adaptive equipment instruction;Discharge planning;Patient/family education OT Basic Self-Care Anticipated Outcome(s): mod I OT Toileting Anticipated Outcome(s): mod I OT Bathroom Transfers Anticipated Outcome(s): mod I OT Recommendation Patient destination: Home   Skilled Therapeutic Intervention Pt engaged in 1:1 evaluation/self-care session.  Denied pain at this time, good historian of PLOF/occupational needs upon d/c.  Pt completed bed mobility with supervision, transferred to w/c with min A for bathing tasks at sink.  Min verbal cues needed for sequencing, physical assist with washing LLE and donning sock on LLE during dressing tasks, pt reports difficulty may be due to increased edema in LE's.  Stood to wash  buttocks/perineal area with min A, assist for balance to reach buttocks thoroughly and avoid broken skin in area. RN notified, present during session to assess sore on buttocks.   Dressed from w/c with increased time for LB dressing, stand-by assist for dynamic standing. Educated on role of occupational therapy in pts care and seated in w/c at bedside upon conclusion of session.  OT Evaluation Precautions/Restrictions    Pain Pain Assessment Pain Assessment: No/denies pain Home Living/Prior Functioning Home Living Available Help at Discharge:  (Son-in-law available 24 hours a day, min physical assist) Type of Home: House Home Layout: Two level;Able to live on main level with bedroom/bathroom Additional Comments: lives with daughter and SIL, SIL is retired and available to assist as needed IADL History Homemaking Responsibilities: No Current License: No Mode of Transportation: Family Prior Function Level of Independence: Other (comment) (mod I with BADL's) Driving: No Vocation: Retired    Optometrist - History Baseline Vision: Wears glasses all the time Visual History: Other (comment) (diplopia) Patient Visual Report: No change from baseline (diplopia resolved with glasses,) Vision - Assessment Eye Alignment: Within Functional Limits Perception Perception: Within Functional Limits Praxis Praxis: Intact  Cognition Overall Cognitive Status: Within Functional Limits for tasks assessed Orientation Level: Oriented X4 Attention: Sustained Sustained Attention: Appears intact Memory: Appears intact Awareness: Appears intact Problem Solving: Appears intact Safety/Judgment: Appears intact Sensation Sensation Stereognosis: Not tested Hot/Cold: Appears Intact Proprioception: Appears Intact Coordination Gross Motor Movements are Fluid and Coordinated: Yes Motor  Motor Motor: Within Functional Limits Mobility  Bed Mobility Supine to Sit: 5: Supervision;HOB  elevated;With rails Transfers Transfers: Sit to Stand;Stand to Sit Sit to Stand: 4: Min assist  Trunk/Postural Assessment  Cervical Assessment Cervical Assessment: Within Functional Limits Thoracic Assessment Thoracic Assessment: Within Functional Limits Lumbar Assessment Lumbar Assessment: Within Functional Limits Postural Control Postural Control: Within Functional Limits  Balance Balance Balance Assessed: Yes Static Sitting Balance Static Sitting - Level of Assistance: 7: Independent Static Standing Balance Static Standing - Balance Support:  Static Standing - Level of Assistance: min-mod A without bilateral upper extremity supported Dynamic Standing Balance Dynamic Standing - Level of Assistance: 5: Stand by assistance Extremity/Trunk Assessment RUE Assessment RUE Assessment: Within Functional Limits RUE AROM (degrees) Overall AROM Right Upper Extremity: Within functional limits for tasks performed RUE Strength RUE Overall Strength: Within Functional Limits for tasks performed RUE Tone RUE Tone: Within Functional Limits LUE Assessment LUE Assessment: Within Functional Limits LUE AROM (degrees) Overall AROM Left Upper Extremity: Within functional limits for tasks assessed LUE Strength LUE Overall Strength: Within Functional Limits for tasks assessed LUE Tone LUE Tone: Within Functional Limits  FIM:  FIM - Grooming Grooming Steps: Wash, rinse, dry face;Brush, comb hair;Wash, rinse, dry hands Grooming: 5: Set-up assist to obtain items FIM - Bathing Bathing Steps Patient Completed: Chest;Right Arm;Left Arm;Buttocks;Front perineal area;Abdomen;Right upper leg;Left upper leg;Right lower leg (including foot);Left lower leg (including foot) Bathing: 4: Min-Patient completes 8-9 58f 10 parts or 75+ percent FIM - Upper Body Dressing/Undressing Upper body dressing/undressing steps patient completed: Thread/unthread right sleeve of pullover shirt/dresss;Thread/unthread left  sleeve of pullover shirt/dress;Put head through opening of pull over shirt/dress;Pull shirt over trunk Upper body dressing/undressing: 5: Set-up assist to: Obtain clothing/put away FIM - Lower Body Dressing/Undressing Lower body dressing/undressing steps patient completed: Thread/unthread right underwear leg;Thread/unthread left underwear leg;Pull underwear up/down;Thread/unthread right pants leg;Thread/unthread left pants leg;Pull pants up/down;Don/Doff right sock Lower body dressing/undressing: 4: Min-Patient completed 75 plus % of tasks FIM - Diplomatic Services operational officer Devices: Grab bars;Walker Toilet Transfers: 4-To toilet/BSC: Min A (steadying Pt. > 75%);4-From toilet/BSC: Min A (steadying Pt. > 75%)   Refer to Care Plan for Long Term Goals  Recommendations for other services: None  Discharge Criteria: Patient will be discharged from OT if patient refuses treatment 3 consecutive times without medical reason, if treatment goals not met, if there is a change in medical status, if patient makes no progress towards goals or if patient is discharged from hospital.  The above assessment, treatment plan, treatment alternatives and goals were discussed and mutually agreed upon: by patient  Kandis Ban 02/05/2013, 10:39 AM

## 2013-02-05 NOTE — Progress Notes (Signed)
Occupational Therapy Session Note  Patient Details  Name: [name] MRN: [id number] Date of Birth: [birth year]  Today's Date: 02/05/2013 Time: 1030-1059 Time Calculation (min): 29 min  STG=LTG  Skilled Therapeutic Interventions/Progress Updates:  1:1 tx session. Pt in w/c in room c/o of mild lightheadedness. BP 136/55. No c/o pain. Pt agreeable to work w/OT. Ed pt re: DME options of shower stall txfr at home. Pt reports she uses built-in corner seat in walk-in shower at home and uses 3 point cane when stepping in/out of shower. Shower txfr not attempted per pt request due to feeling like LEs not strong enough to step over edge. Pt only needed steadying assist for amb w/RW in ADL apt. She needed steadying assist for sit>stand from couch and min A for LE mgmt into regular bed. Pt (I) to roll side<>side and for sup>sit EOB. Supervision to steadying assist amb back to w/c. Pt left in w/c at bedside in room w/call light and phone w/in reach. Needs met.  Therapy Documentation Precautions:  Restrictions Weight Bearing Restrictions: No  See FIM for current functional status  Therapy/Group: Individual Therapy  Prophet Renwick, Deidre Ala 02/05/2013, 12:19 PM

## 2013-02-05 NOTE — Progress Notes (Signed)
Patient information reviewed and entered into [facility] by Becky Klara Stjames, covering PPS coordinator. Information including medical coding and functional independence measure will be reviewed and updated through discharge.  Per nursing, patient was given "Data Collection Information Summary for Patients in Inpatient Rehabilitation Facilities with attached Privacy Act Statement Health Care Records" upon admission.     

## 2013-02-06 ENCOUNTER — Inpatient Hospital Stay (HOSPITAL_COMMUNITY): Payer: Medicare Other | Admitting: Physical Therapy

## 2013-02-06 ENCOUNTER — Inpatient Hospital Stay (HOSPITAL_COMMUNITY): Payer: Medicare Other | Admitting: Occupational Therapy

## 2013-02-06 ENCOUNTER — Inpatient Hospital Stay (HOSPITAL_COMMUNITY): Payer: Medicare Other

## 2013-02-06 NOTE — Progress Notes (Signed)
Physical Therapy Note  Patient Details  Name: [name] MRN: [id number] Date of Birth: [birth year] Today's Date: 02/06/2013  4098-1191 (55 minutes) individual Pain: no reported pain Focus of treatment: therapeutic exercise focused on bilateral LE strengthening/ activity tolerance; therapeutic activity- bathroom transfer;  Bed mobility training Treatment: Pt in bed upon arrival ; supine to sit using rail and raised HOB SBA; gait to bathroom RW SBA; pt performed hygiene with min assist for clothes management; sit to supine (mat) mod assist bilateral LE ; supine to sit (mat / flat surface) vcs for side to sit with difficulty; ankle pumps X 20; heel slides X 20; standing hip flexion using rail as support - pt able to step to 4 inch step on left and 6 inch step on right X 10; standing bilateral hip abduction x 10; up/down 4 inch step alternating LEs for quad strengthening; Nustep Level 4 X 10 minutes for LE strengthening/ activity tolerance.   4782-9562 (40 minutes) individual Pain: no reported pain Focus of treatment: Therapeutic exercise focused on bilateral LE strengthening to decrease assist with bed mobility; car transfer training; bed mobility training on std bed.  Treatment: wc >< car transfer with RW SBA (conventional height car); sit to supine std. Bed mod assist bilateral LEs from the left or right X 2; SLR on Right LE 2 x 5 ; SLR on left AA 2 X 5 (quickly fatigues).   Hatley Henegar,JIM 02/06/2013, 9:41 AM

## 2013-02-06 NOTE — Progress Notes (Signed)
Occupational Therapy Session Note  Patient Details  Name: [name] MRN: [id number] Date of Birth: 1924-11-25  Today's Date: 02/06/2013 Time: 1000-1100 Time Calculation (min): 60 min  Short Term Goals: Week 1:  OT Short Term Goal 1 (Week 1): STG=LTG  Skilled Therapeutic Interventions/Progress Updates:    Pt in bathroom on toilet upon arrival.  Pt completed toileting tasks with supervision and toilet transfer with steady A.  Pt declined bathing this morning and stated she only needed to change her underpants.  Pt required assistance threading LLE but was able to stand and pull up pants with steady A only.  Pt transitioned to ADL apartment and engaged in sit<>stand from sofa, squats (8 x 3) and stepping up on step (5 X 3).  Pt amb with RW approx 40' in hallway.  Focus on activity tolerance, dynamic standing balance, transfers, and safety awareness.  Therapy Documentation Precautions:  Precautions Precautions: Fall Precaution Comments: generalized weakness, swelling in BLEs, old TIA which she atributes LLE weakness to Restrictions Weight Bearing Restrictions: No   Pain: Pain Assessment Pain Assessment: No/denies pain  See FIM for current functional status  Therapy/Group: Individual Therapy  Rich Brave 02/06/2013, 2:48 PM

## 2013-02-06 NOTE — IPOC Note (Signed)
Overall Plan of Care [facility]) Patient Details Name: Marlean Mortell MRN: [id number] DOB: October 25, 1924  Admitting Diagnosis: CMV  Hospital Problems: Principal Problem:   Physical deconditioning     Functional Problem List: Nursing Edema;Endurance;Skin Integrity;Pain;Medication Management  PT Balance;Endurance;Motor;Safety;Skin Integrity  OT Endurance;Balance;Edema  SLP    TR         Basic ADL's: OT Grooming;Bathing;Dressing;Toileting     Advanced  ADL's: OT       Transfers: PT Bed Mobility;Bed to Chair;Car;Furniture  OT Toilet;Tub/Shower     Locomotion: PT Ambulation;Stairs;Wheelchair Mobility     Additional Impairments: OT    SLP        TR      Anticipated Outcomes Item Anticipated Outcome  Self Feeding    Swallowing      Basic self-care  mod I  Toileting  mod I   Bathroom Transfers mod I  Bowel/Bladder  Independent of bowel and bladder  Transfers  Mod I  Locomotion  Mod I  Communication     Cognition     Pain  Pain less than or equal  to 3 out of 10   Safety/Judgment  Independent   Therapy Plan: PT Intensity: Minimum of 1-2 x/day ,45 to 90 minutes PT Frequency: 5 out of 7 days PT Duration Estimated Length of Stay: one week OT Intensity: Minimum of 1-2 x/day, 45 to 90 minutes OT Frequency: 5 out of 7 days OT Duration/Estimated Length of Stay: 1 week         Team Interventions: Nursing Interventions Patient/Family Education;Pain Management;Medication Management;Skin Care/Wound Management;Discharge Planning  PT interventions Ambulation/gait training;Balance/vestibular training;Discharge planning;DME/adaptive equipment instruction;Functional mobility training;Neuromuscular re-education;Patient/family education;Skin care/wound management;Stair training;Therapeutic Activities;Therapeutic Exercise;UE/LE Strength taining/ROM;UE/LE Coordination activities;Wheelchair propulsion/positioning  OT Interventions Warden/ranger;Therapeutic  Exercise;UE/LE Coordination activities;Self Care/advanced ADL retraining;UE/LE Strength taining/ROM;Therapeutic Activities;Functional mobility training;DME/adaptive equipment instruction;Discharge planning;Patient/family education  SLP Interventions    TR Interventions    SW/CM Interventions Psychosocial Support;Patient/Family Education;Discharge Planning    Team Discharge Planning: Destination: PT-Home ,OT- Home , SLP-  Projected Follow-up: PT-Home health PT;24 hour supervision/assistance, OT-   , SLP-  Projected Equipment Needs: PT-3 in 1 bedside comode;Rolling walker with 5" wheels, OT-  , SLP-  Patient/family involved in discharge planning: PT- Patient,  OT-Patient, SLP-   MD ELOS: one week Medical Rehab Prognosis:  Excellent Assessment: The patient has been admitted for CIR therapies. The team will be addressing, functional mobility, strength, stamina, balance, safety, adaptive techniques/equipment, self-care, bowel and bladder mgt, patient and caregiver education, pain mgt. Goals have been set at Cedric Fishman, MD, [facility]      See Team Conference Notes for weekly updates to the plan of care

## 2013-02-06 NOTE — Progress Notes (Signed)
Social Work Patient ID: [name], female   DOB: 1925/02/07, 79 y.o.   MRN: [id number]  Amada Jupiter, LCSW Social Worker Signed  Patient Care Conference Service date: 02/06/2013 3:01 PM  Inpatient RehabilitationTeam Conference and Plan of Care Update Date: 02/05/2013   Time: 3:05 PM     Patient Name: [name]       Medical Record Number: [id number]   Date of Birth: 14-Sep-1924 Sex: Female         Room/Bed: 4W23C/4W23C-01 Payor Info: Payor: MEDICARE / Plan: MEDICARE PART A AND B / Product Type: *No Product type* /   Admitting Diagnosis: CMV   Admit Date/Time:  02/04/2013  3:35 PM Admission Comments: No comment available   Primary Diagnosis:  Physical deconditioning Principal Problem: Physical deconditioning    Patient Active Problem List     Diagnosis  Date Noted   .  Physical deconditioning  02/04/2013   .  Atrial fibrillation  01/30/2013   .  Unintentional weight loss  01/29/2013   .  Lower GI bleed  01/24/2013   .  DVT of leg (deep venous thrombosis)  01/23/2013   .  Nausea alone  01/14/2013   .  Loss of appetite  01/14/2013   .  Malnutrition of moderate degree  12/10/2012   .  CMV colitis  12/09/2012   .  Hypokalemia  12/07/2012   .  Acute renal failure  12/07/2012   .  VITAMIN B12 DEFICIENCY  02/22/2010   .  ANXIETY  12/26/2007   .  Myasthenia gravis without exacerbation  12/26/2007   .  TIA  12/26/2007   .  HYPOTHYROIDISM  11/10/2006   .  HYPERLIPIDEMIA  11/10/2006   .  ANEMIA-NOS  11/10/2006   .  HYPERTENSION  11/10/2006     Expected Discharge Date: Expected Discharge Date: 02/11/13  Team Members Present: Physician leading conference: Dr. Faith Rogue Social Worker Present: Amada Jupiter, LCSW Nurse Present: Ethelene Browns, RN PT Present: Zerita Boers, PT OT Present: Mackie Pai, Marye Round, OT;Ardis Rowan, COTA SLP Present: Feliberto Gottron, SLP PPS Coordinator present : Edson Snowball, PT        Current Status/Progress  Goal  Weekly  Team Focus   Medical     CMV enteritis, deconditionng, hx of MG  pain control, skin care, increase physical activity  bowel mgt, id mgt,   Bowel/Bladder     Continent of bowel and bladder. LBM 9/16 3X formed and loose stool. Imodium given.  Remain incontinent of bowel and bladder  Monitor diarrhea/constipation q shift.   Swallow/Nutrition/ Hydration            ADL's     supervision- min A with BADL's  mod I with ADL's  shower and toilet transfers, activity tolerance, adaptive eqiupment for bathing/dressing as needed   Mobility     Min assist overall for transfers, ambulation and stairs  Mod I overall, supervision for stairs and car transfer  balance, gait training, safety, education, DME needs   Communication            Safety/Cognition/ Behavioral Observations           Pain     n/a  Pain level 3 or less on a scale of 0-10  Monitor any onset of pain and assess for effectiveness   Skin     stage 2 buttocks mid with Allevyn dressing/ bilateral open blisters  No new skin breakdown/infection  Assess and monitor skin q shift  Rehab Goals Patient on target to meet rehab goals: Yes *See Care Plan and progress notes for long and short-term goals.    Barriers to Discharge:  weakness, stool consistency      Possible Resolutions to Barriers:    bowel program, normalize intake stools, adaptive equipment      Discharge Planning/Teaching Needs:    home with daughter and son-in-law who can provide 24/ 7 assistance      Team Discussion:    Still with some bowel issues but resolving.  Making good progress and anticipating mod i goals overall.  No concerns   Revisions to Treatment Plan:    None    Continued Need for Acute Rehabilitation Level of Care: The patient requires daily medical management by a physician with specialized training in physical medicine and rehabilitation for the following conditions: Daily direction of a multidisciplinary physical rehabilitation program to ensure  safe treatment while eliciting the highest outcome that is of practical value to the patient.: Yes Daily medical management of patient stability for increased activity during participation in an intensive rehabilitation regime.: Yes Daily analysis of laboratory values and/or radiology reports with any subsequent need for medication adjustment of medical intervention for : Neurological problems;Post surgical problems;Other  Tyrica Afzal 02/06/2013, 3:01 PM

## 2013-02-06 NOTE — Progress Notes (Signed)
Social Work Patient ID: [name], female   DOB: June 13, 1924, 80 y.o.   MRN: [id number]  Met with patient and daughter, [name], this afternoon to review team conference. Both aware and agreeable with targeted d/c date of 9/22 (after a.m. Therapies).  Both very pleased with progress, however, daughter concerned that we insure that she is medically stable at time of d/c.  Notes that after prior d/c's pt returned rather quickly due to dehydration and potassium issues.  Discussed with daughter that I will follow up with entire team by Fri afternoon to confirm we are on track both medically and with therapies for a 9/22 d/c.  Will continue to follow.  Justen Fonda, LCSW

## 2013-02-06 NOTE — Evaluation (Signed)
This note has been reviewed and this clinician agrees with information provided.  

## 2013-02-06 NOTE — Patient Care Conference (Signed)
Inpatient RehabilitationTeam Conference and Plan of Care Update Date: 02/05/2013   Time: 3:05 PM    Patient Name: [name]      Medical Record Number: [id number]  Date of Birth: [birth year] Sex: Female         Room/Bed: 4W23C/4W23C-01 Payor Info: Payor: MEDICARE / Plan: MEDICARE PART A AND B / Product Type: *No Product type* /    Admitting Diagnosis: CMV  Admit Date/Time:  02/04/2013  3:35 PM Admission Comments: No comment available   Primary Diagnosis:  Physical deconditioning Principal Problem: Physical deconditioning  Patient Active Problem List   Diagnosis Date Noted  . Physical deconditioning 02/04/2013  . Atrial fibrillation 01/30/2013  . Unintentional weight loss 01/29/2013  . Lower GI bleed 01/24/2013  . DVT of leg (deep venous thrombosis) 01/23/2013  . Nausea alone 01/14/2013  . Loss of appetite 01/14/2013  . Malnutrition of moderate degree 12/10/2012  . 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 Hypokalemia 12/07/2012  . Acute renal failure 12/07/2012  . VITAMIN B12 DEFICIENCY 02/22/2010  . ANXIETY 12/26/2007  . Myasthenia gravis without exacerbation 12/26/2007  . TIA 12/26/2007  . HYPOTHYROIDISM 11/10/2006  . HYPERLIPIDEMIA 11/10/2006  . ANEMIA-NOS 11/10/2006  . HYPERTENSION 11/10/2006    Expected Discharge Date: Expected Discharge Date: 02/11/13  Team Members Present: Physician leading conference: Dr. Faith Rogue Social Worker Present: Amada Jupiter, LCSW Nurse Present: Ethelene Browns, RN PT Present: Zerita Boers, PT OT Present: Mackie Pai, Marye Round, OT;Ardis Rowan, COTA SLP Present: Feliberto Gottron, SLP PPS Coordinator present : Edson Snowball, PT     Current Status/Progress Goal Weekly Team Focus  Medical   CMV enteritis, deconditionng, hx of MG  pain control, skin care, increase physical activity  bowel mgt, id mgt,   Bowel/Bladder   Continent of bowel and bladder. LBM 9/16 3X formed and loose stool. Imodium given.  Remain incontinent of  bowel and bladder  Monitor diarrhea/constipation q shift.   Swallow/Nutrition/ Hydration             ADL's   supervision- min A with BADL's  mod I with ADL's  shower and toilet transfers, activity tolerance, adaptive eqiupment for bathing/dressing as needed   Mobility   Min assist overall for transfers, ambulation and stairs  Mod I overall, supervision for stairs and car transfer  balance, gait training, safety, education, DME needs   Communication             Safety/Cognition/ Behavioral Observations            Pain   n/a  Pain level 3 or less on a scale of 0-10  Monitor any onset of pain and assess for effectiveness   Skin   stage 2 buttocks mid with Allevyn dressing/ bilateral open blisters  No new skin breakdown/infection  Assess and monitor skin q shift    Rehab Goals Patient on target to meet rehab goals: Yes *See Care Plan and progress notes for long and short-term goals.  Barriers to Discharge: weakness, stool consistency    Possible Resolutions to Barriers:  bowel program, normalize intake stools, adaptive equipment    Discharge Planning/Teaching Needs:  home with daughter and son-in-law who can provide 24/ 7 assistance      Team Discussion:  Still with some bowel issues but resolving.  Making good progress and anticipating mod i goals overall.  No concerns  Revisions to Treatment Plan:  None   Continued Need for Acute Rehabilitation Level of Care: The patient requires daily medical management by a  physician with specialized training in physical medicine and rehabilitation for the following conditions: Daily direction of a multidisciplinary physical rehabilitation program to ensure safe treatment while eliciting the highest outcome that is of practical value to the patient.: Yes Daily medical management of patient stability for increased activity during participation in an intensive rehabilitation regime.: Yes Daily analysis of laboratory values and/or radiology  reports with any subsequent need for medication adjustment of medical intervention for : Neurological problems;Post surgical problems;Other  Nikhil Osei 02/06/2013, 3:01 PM

## 2013-02-06 NOTE — Progress Notes (Signed)
Occupational Therapy Session Note  Patient Details  Name: [name] MRN: [id number] Date of Birth: Jul 26, 1924  Today's Date: 02/06/2013 Time: 1300-1330 Time Calculation (min): 30 min  Short Term Goals: Week 1:  OT Short Term Goal 1 (Week 1): STG=LTG  Skilled Therapeutic Interventions/Progress Updates:    1:1 focus on therapeutic exercises for core and LB strengthening, activity tolerance/ endurance in prep for being mod I with all basic ADL tasks. Focused on sitting to supine for bed mobility to bring LEs into bed with extra time and mod cuing for sequencing and encouragement. In supine performed exercises with focus on quad strengthening left>right, core activation, stretching hamstrings and ankle pumps to decrease swelling. Pt able to perform functional ambulation from gym to elevators before needed a break to be returned to the room.  Therapy Documentation Precautions:  Precautions Precautions: Fall Precaution Comments: generalized weakness, swelling in BLEs, old TIA which she atributes LLE weakness to Restrictions Weight Bearing Restrictions: No Pain:  no c/o pain   See FIM for current functional status  Therapy/Group: Individual Therapy  [facility] 02/06/2013, 2:21 PM

## 2013-02-06 NOTE — Progress Notes (Signed)
Patient's sacral area with multiple pressure ulcers, foul smelling, pus like drainage on Alevyn. New Alevyn applied. Discussed patient's skin integrity with patient's daughter. Relates first awareness of breakdown in latter part of June. Patient with impaired nutrition, diarrhea, change in K levels, and impaired mobility. Family is requesting a "culture" of the wound. This nurse advocating for such intervention and follow-up. Patient has been at [facility] and [facility] within the last couple of months for co-morbidities. Daughter, Carley Hammed, expressed concern about skin integrity and risk of infection. Sherlyn Lees, RN

## 2013-02-06 NOTE — Progress Notes (Signed)
Subjective/Complaints: No major problems. Did well with therapies yesterday A 12 point review of systems has been performed and if not noted above is otherwise negative.   Objective: Vital Signs: Blood pressure 117/59, pulse 68, temperature 97.5 F (36.4 C), temperature source Oral, resp. rate 20, height 5\' 6"  (1.676 m), weight 87.1 kg (192 lb 0.3 oz), SpO2 100.00%. No results found.  Recent Labs  02/05/13 0500  WBC 3.5*  HGB 7.8*  HCT 23.4*  PLT 131*    Recent Labs  02/04/13 0612 02/05/13 0500  NA 138 138  K 3.0* 3.0*  CL 110 108  GLUCOSE 101* 100*  BUN 4* 6  CREATININE 0.63 0.72  CALCIUM 7.3* 7.5*   CBG (last 3)   Recent Labs  02/03/13 2100  GLUCAP 119*    Wt Readings from Last 3 Encounters:  02/04/13 87.1 kg (192 lb 0.3 oz)  01/27/13 80.4 kg (177 lb 4 oz)  01/27/13 80.4 kg (177 lb 4 oz)    Physical Exam:  Constitutional: She is oriented to person, place, and time. She appears well-developed and well-nourished.  HENT:  Head: Normocephalic and atraumatic.  Eyes: EOM are normal.  Neck: Normal range of motion. Neck supple. No JVD present. No tracheal deviation present. No thyromegaly present.  Cardiovascular: Normal rate and regular rhythm. Small systolic murmur No murmur heard.  Pulmonary/Chest: Effort normal and breath sounds normal. No respiratory distress. She has no wheezes. She has no rales.  Abdominal: Soft. Bowel sounds are normal. She exhibits no distension. There is no tenderness. There is no rebound.  Lymphadenopathy:  She has no cervical adenopathy.  Neurological: She is alert and oriented to person, place, and time. No cranial nerve deficit. Coordination normal.  Moved all 4's. No sensory loss distally. Strength 4- deltoid, biceps, triceps, 4/5 HI. LE 3/5 HF, KE 3+, Feet 4/5.  Skin: Skin is warm and dry. Are of superficial breakdown over the sacral region/buttocks, approx 3x2in and second is 2x2, small area inferior as well. All are shallow with  small amount of fibronecrotic tissue at surface.  Psychiatric: She has a normal mood and affect. Her behavior is normal. Thought content normal   Assessment/Plan: 1. 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 Physiatrist is providing close team supervision and 24 hour management of active medical problems listed below. Physiatrist and rehab team continue to assess barriers to discharge/monitor patient progress toward functional and medical goals. FIM: FIM - Bathing Bathing Steps Patient Completed: Chest;Right Arm;Left Arm;Buttocks;Front perineal area;Abdomen;Right upper leg;Left upper leg;Right lower leg (including foot);Left lower leg (including foot) Bathing: 4: Min-Patient completes 8-9 62f 10 parts or 75+ percent  FIM - Upper Body Dressing/Undressing Upper body dressing/undressing steps patient completed: Thread/unthread right sleeve of pullover shirt/dresss;Thread/unthread left sleeve of pullover shirt/dress;Put head through opening of pull over shirt/dress;Pull shirt over trunk Upper body dressing/undressing: 5: Set-up assist to: Obtain clothing/put away FIM - Lower Body Dressing/Undressing Lower body dressing/undressing steps patient completed: Thread/unthread right underwear leg;Thread/unthread left underwear leg;Pull underwear up/down;Thread/unthread right pants leg;Thread/unthread left pants leg;Pull pants up/down;Don/Doff right sock Lower body dressing/undressing: 4: Min-Patient completed 75 plus % of tasks  FIM - Toileting Toileting steps completed by patient: Adjust clothing prior to toileting;Performs perineal hygiene;Adjust clothing after toileting Toileting Assistive Devices: Grab bar or rail for support Toileting: 5: Supervision: Safety issues/verbal cues  FIM - Diplomatic Services operational officer Devices: Grab bars  Toilet  Transfers: 4-From toilet/BSC: Min A (steadying Pt. > 75%)  FIM - Bed/Chair Transfer Bed/Chair Transfer Assistive Devices: Therapist, occupational: 5: Supine > Sit: Supervision (verbal cues/safety issues);4: Sit > Supine: Min A (steadying pt. > 75%/lift 1 leg);4: Bed > Chair or W/C: Min A (steadying Pt. > 75%);4: Chair or W/C > Bed: Min A (steadying Pt. > 75%)  FIM - Locomotion: Wheelchair Distance: 80' Locomotion: Wheelchair: 2: Travels 50 - 149 ft with supervision, cueing or coaxing FIM - Locomotion: Ambulation Locomotion: Ambulation Assistive Devices: Designer, industrial/product (and rollator and without AD) Ambulation/Gait Assistance: 3: Mod assist;4: Min assist Locomotion: Ambulation: 2: Travels 50 - 149 ft with minimal assistance (Pt.>75%)  Comprehension Comprehension Mode: Auditory Comprehension: 6-Follows complex conversation/direction: With extra time/assistive device  Expression Expression Mode: Verbal Expression: 6-Expresses complex ideas: With extra time/assistive device  Social Interaction Social Interaction: 7-Interacts appropriately with others - No medications needed.  Problem Solving Problem Solving: 7-Solves complex problems: Recognizes & self-corrects  Memory Memory: 7-Complete Independence: No helper Medical Problem List and Plan:  1. 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 DVT Prophylaxis/Anticoagulation: Recent findings of left DVT 01/14/2013. Coumadin discontinued suspect GI bleed with IVC filter placed 01/26/2013.  3. Pain Management: Tylenol as needed  4. Mood: Xanax as needed, Wellbutrin 150 mg daily. Providing emotional support   -prn trazodone to help with sleep 5. Neuropsych: This patient is capable of making decisions on his own behalf.  6. Myasthenia gravis. Continue CellCept as well as Valcyte. 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 Continue Valcyte 900 mg twice daily initiated  01/29/2013 x3 weeks then reduce to 900 mg daily   -continue to work on bowel pattern/stool consistency, imodium, probiotic  -replace potassium 8. Chronic anemia/GI bleed. Patient has been transfused. Monitor hemoglobin as well as platelet counts  9. Hypothyroidism. Synthroid  10. PAF. Continue Lanoxin. Cardiac rate controlled.  11. Diastolic congestive heart failure. Monitor for any signs of fluid overload. 12. Anemia: hgb 7.8, continue to follow, hx of likely GI bleed on coumadin before  -serial cbc's   LOS (Days) 2 A FACE TO FACE EVALUATION WAS PERFORMED  SWARTZ,ZACHARY T 02/06/2013 8:31 AM

## 2013-02-06 NOTE — Progress Notes (Signed)
Inpatient Rehabilitation Center Individual Statement of Services  Patient Name:  [name]  Date:  02/06/2013  Welcome to the Inpatient Rehabilitation Center.  Our goal is to provide you with an individualized program based on your diagnosis and situation, designed to meet your specific needs.  With this comprehensive rehabilitation program, you will be expected to participate in at least 3 hours of rehabilitation therapies Monday-Friday, with modified therapy programming on the weekends.  Your rehabilitation program will include the following services:  Physical Therapy (PT), Occupational Therapy (OT), 24 hour per day rehabilitation nursing, Therapeutic Recreaction (TR), Case Management (Social Worker), Rehabilitation Medicine, Nutrition Services and Pharmacy Services  Weekly team conferences will be held on Tuesdays to discuss your progress.  Your Social Worker will talk with you frequently to get your input and to update you on team discussions.  Team conferences with you and your family in attendance may also be held.  Expected length of stay: 1 week  Overall anticipated outcome: modified independent  Depending on your progress and recovery, your program may change. Your Social Worker will coordinate services and will keep you informed of any changes. Your Social Worker's name and contact numbers are listed  below.  The following services may also be recommended but are not provided by the Inpatient Rehabilitation Center:    Home Health Rehabiltiation Services  Outpatient Rehabilitation Services    Arrangements will be made to provide these services after discharge if needed.  Arrangements include referral to agencies that provide these services.  Your insurance has been verified to be:  Medicare and AARP Your primary doctor is:  Dr. Gershon Crane  Pertinent information will be shared with your doctor and your insurance company.  Social Worker:  [place], [place] [phone] or  [phone]   Information discussed with and copy given to patient by: Amada Jupiter, 02/06/2013, 3:16 PM

## 2013-02-06 NOTE — Progress Notes (Signed)
Social Work  Social Work Assessment and Plan  Patient Details  Name: [name] MRN: [id number] Date of Birth: September 01, 1924  Today's Date: 02/06/2013  Problem List:  Patient Active Problem List   Diagnosis Date Noted  . Physical deconditioning 02/04/2013  . Atrial fibrillation 01/30/2013  . Unintentional weight loss 01/29/2013  . Lower GI bleed 01/24/2013  . DVT of leg (deep venous thrombosis) 01/23/2013  . Nausea alone 01/14/2013  . Loss of appetite 01/14/2013  . Malnutrition of moderate degree 12/10/2012  . 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 Hypokalemia 12/07/2012  . Acute renal failure 12/07/2012  . VITAMIN B12 DEFICIENCY 02/22/2010  . ANXIETY 12/26/2007  . Myasthenia gravis without exacerbation 12/26/2007  . TIA 12/26/2007  . HYPOTHYROIDISM 11/10/2006  . HYPERLIPIDEMIA 11/10/2006  . ANEMIA-NOS 11/10/2006  . HYPERTENSION 11/10/2006   Past Medical History:  Past Medical History  Diagnosis Date  . Anemia   . Hyperlipemia   . Hypertension   . Thyroid disease     hypothyroidism  . Anxiety   . Paroxysmal atrial fibrillation   . Vitamin B 12 deficiency   . CHF (congestive heart failure)   . TIA (transient ischemic attack)     4-09  . Microscopic hematuria     benign microscopic hematuria, worked up b Dr Alexis Frock  . Gall stone     porcelain gall bladder with single stone  per CT 02-2009 Dr Birdie Sons  . Osteoporosis     DEXA on 04-30-10  . Myasthenia gravis     sees Dr. Terrace Arabia   . Ulcerative colitis   . DVT of leg (deep venous thrombosis)     left leg    Past Surgical History:  Past Surgical History  Procedure Laterality Date  . Cardiac catheterization      04/1999  . Abdominal hysterectomy      with oophorectomy  . Colonoscopy  1999    normal   . Colonoscopy N/A 12/10/2012    Procedure: COLONOSCOPY;  Surgeon: Iva Boop, MD;  Location: [facility];  Service: Endoscopy;  Laterality: N/A;  . Flexible sigmoidoscopy N/A 01/26/2013    Procedure: FLEXIBLE  SIGMOIDOSCOPY;  Surgeon: Iva Boop, MD;  Location: [facility];  Service: Endoscopy;  Laterality: N/A;   Social History:  reports that she has never smoked. She has never used smokeless tobacco. She reports that she does not drink alcohol or use illicit drugs.  Family / Support Systems Marital Status: Widow/Widower How Long?: 3 yrs Patient Roles: Parent Children: daughter, [name] @ [phone] or [phone] (in [place]) and daughter, [name] @ [phone] or [phone] and [phone] (pt lives with this daughter) Other Supports: son-in-law, [name] Anticipated Caregiver: [name] and son-in-law Ability/Limitations of Caregiver: Dtr [name] works.  Son-in-law not working but is 98 yo and has heart trouble. Caregiver Availability: 24/7 Family Dynamics: pt describes family as very supportive.  Denies any concerns about family providing any needed assistance after d/c.  Social History Preferred language: English Religion: Protestant Cultural Background: Pt originally from [place] (and husband from [place]) - they came to [place] approx 41 yrs ago Education: HS Read: Yes Write: Yes Employment Status: Retired Fish farm manager Issues: none Guardian/Conservator: none - pt capable of making her own decisions per MD   Abuse/Neglect Physical Abuse: Denies Verbal Abuse: Denies Sexual Abuse: Denies Exploitation of patient/patient's resources: Denies Self-Neglect: Denies  Emotional Status Pt's affect, behavior adn adjustment status: Pt very pleasant, oriented and pleased with progress so  far.  Admits frustration with her loss of independence overall, but hopeful to regain prior level of function.  Denies any significant emotional distress.  No s/s of depression or anxiety.  Will monitor and refer to neuropsych as needed. Recent Psychosocial Issues: None within past couple of years.  Husband died 3 yrs ago.  Daughter died 7 yrs ago from colon ca. Pyschiatric History:  None Substance Abuse History: None  Patient / Family Perceptions, Expectations & Goals Pt/Family understanding of illness & functional limitations: pt with basic understanding of her medical issues/ virus which led to colitis and of current functional deficits/ need for CIR. Premorbid pt/family roles/activities: pt independent at home.  Daughter would assist with transportation to grocery store, MD appt, etc.   Anticipated changes in roles/activities/participation: With pt reaching mod i goals, do not anticipate much change overall. Pt/family expectations/goals: Pt hopeful she can regain her prior level of function  [employer]: None Premorbid Home Care/DME Agencies: None Transportation available at discharge: yes  Discharge Planning Living Arrangements: Children Support Systems: Children Type of Residence: Private residence Insurance Resources: Electrical engineer Resources: Restaurant manager, fast food Screen Referred: No Living Expenses: Lives with family Money Management: Family Does the patient have any problems obtaining your medications?: No Home Management: pt and family share Patient/Family Preliminary Plans: Pt to return home with daughter, [name] and son-in-law where intermittient supervision is available. Social Work Anticipated Follow Up Needs: HH/OP Expected length of stay: 1 week  Clinical Impression Very pleasant, elderly woman here after GI virus and very deconditioned.  Motivated for therapy and anticipating mod i goals.  Good family support .  Will follow for d/c planning needs and support.  [name] 02/06/2013, 4:51 PM

## 2013-02-07 ENCOUNTER — Inpatient Hospital Stay (HOSPITAL_COMMUNITY): Payer: Medicare Other

## 2013-02-07 ENCOUNTER — Inpatient Hospital Stay (HOSPITAL_COMMUNITY): Payer: Medicare Other | Admitting: Physical Therapy

## 2013-02-07 LAB — BASIC METABOLIC PANEL
BUN: 6 mg/dL (ref 6–23)
CO2: 21 mEq/L (ref 19–32)
Calcium: 7.8 mg/dL — ABNORMAL LOW (ref 8.4–10.5)
Creatinine, Ser: 0.75 mg/dL (ref 0.50–1.10)
GFR calc non Af Amer: 75 mL/min — ABNORMAL LOW (ref >90)
Glucose, Bld: 97 mg/dL (ref 70–99)

## 2013-02-07 LAB — CBC
HCT: 22.4 % — ABNORMAL LOW (ref 36.0–46.0)
Hemoglobin: 7.7 g/dL — ABNORMAL LOW (ref 12.0–15.0)
MCH: 29.1 pg (ref 26.0–34.0)
MCHC: 34.4 g/dL (ref 30.0–36.0)
MCV: 84.5 fL (ref 78.0–100.0)
RBC: 2.65 MIL/uL — ABNORMAL LOW (ref 3.87–5.11)

## 2013-02-07 MED ORDER — POTASSIUM CHLORIDE CRYS ER 20 MEQ PO TBCR
40.0000 meq | EXTENDED_RELEASE_TABLET | Freq: Two times a day (BID) | ORAL | Status: DC
  Filled 2013-02-07 (×2): qty 2

## 2013-02-07 MED ORDER — COLLAGENASE 250 UNIT/GM EX OINT
TOPICAL_OINTMENT | Freq: Every day | CUTANEOUS | Status: DC
  Filled 2013-02-07: qty 30

## 2013-02-07 MED ORDER — POTASSIUM CHLORIDE CRYS ER 20 MEQ PO TBCR
40.0000 meq | EXTENDED_RELEASE_TABLET | Freq: Two times a day (BID) | ORAL | Status: DC
  Administered 2013-02-07 – 2013-02-09 (×5): 40 meq via ORAL
  Filled 2013-02-07 (×9): qty 2

## 2013-02-07 NOTE — Significant Event (Signed)
CRITICAL VALUE ALERT   Critical value received:K+ 2.7 Date of notification:  02/07/2013  Time of notification: 0715  Critical value read back:yes  Nurse who received alert:  Joesphine Bare  MD notified (1st page): [phone]  Time of first page:0716 MD notified (2nd page):  Time of second page:  Responding MD: Malissa Hippo, PA Time MD responded:0716

## 2013-02-07 NOTE — Progress Notes (Signed)
Physical Therapy Session Note  Patient Details  Name: [name] MRN: [id number] Date of Birth: 12/26/1924  Today's Date: 02/07/2013 Time: 1510-1550 Time Calculation (min): 40 min  Skilled Therapeutic Interventions/Progress Updates:  1:1. Pt using bathroom upon entry w/ nurse tech, therapist stepped in to provide assist. Pt able to manage clothing and pericare w/ use of grab bar and steadying assist as well as perform SPT toilet>w/c w/ min A. In therapy gym, focus on static balance on both compliant and non-compliant surfaces as well as dynamic balance on non-compliant surface w/ RW for B UE support as needed. Pt req close (S) to min guard for static balance on non-compliant surface, however, req min A for static balance on compliant surface and dynamic standing balance. Balance challenges modified from [facility] Test. Pt able to propel w/c x75' w/ B UE and close (S) at end of tx session. Pt able to perform SPT w/c>bed w/ RW and min A and req mod A for B LE during t/f sit>sup. Briefly reviewed benefit of sidelying for pressure relief to promote healing on bottom. Pt in R sidelying w/ supportive pillows at end of tx session w/ all needs in reach and bed alarm on.   Therapy Documentation Precautions:  Precautions Precautions: Fall Precaution Comments: generalized weakness, swelling in BLEs, old TIA which she atributes LLE weakness to Restrictions Weight Bearing Restrictions: No General:   Vital Signs:   Pain: Pain Assessment Pain Assessment: No/denies pain  See FIM for current functional status  Therapy/Group: Individual Therapy  Denzil Hughes 02/07/2013, 4:27 PM

## 2013-02-07 NOTE — Progress Notes (Signed)
Occupational Therapy Session Note  Patient Details  Name: [name] MRN: [id number] Date of Birth: May 10, 1925  Today's Date: 02/07/2013 Time: 1400-1430 Time Calculation (min): 30 min  Short Term Goals: Week 1:  OT Short Term Goal 1 (Week 1): STG=LTG  Skilled Therapeutic Interventions/Progress Updates: Therapeutic activity with emphasis on functional mobility using RW, endurance, and safety awareness.   Patient completed bed mobility unassisted to rise from supine to sitting at the edge of her bed.  Patient aware of need for improved endurance and mobility and requested standby assist as she attempted to walk as advised.   OT challenged patient to walk at least 150' or "as far as possible" with supervision for safety.   Patient accepted challenge and ambulated 176' from her room to the hallway, without contact or steadying assist, before reporting fatigue with the need to sit down.  After 4-5 minute rest seated in w/c, patient returned to her room, travelling the same distance (176"), and recovered to her bed with mod assist to lift her legs back into bed and to place a pillow between her legs for comfort.     Therapy Documentation Precautions:  Precautions Precautions: Fall Precaution Comments: generalized weakness, swelling in BLEs, old TIA which she atributes LLE weakness to Restrictions Weight Bearing Restrictions: No  Pain: Pain Assessment Pain Assessment: No/denies pain   Therapy/Group: Individual Therapy  Georgeanne Nim 02/07/2013, 3:36 PM

## 2013-02-07 NOTE — Progress Notes (Signed)
Physical Therapy Session Note  Patient Details  Name: [name] MRN: [id number] Date of Birth: [birth year]  Today's Date: 02/07/2013 Time: 1035-1130 Time Calculation (min): 55 min  Short Term Goals: Week 1:  PT Short Term Goal 1 (Week 1): =LTG's due to anticipated short LOS.   Skilled Therapeutic Interventions/Progress Updates:    Discussed importance of pressure relief (rolling in bed and sidebends or wheelchair pushups while in chair) every thirty min, pt practiced each however appears to have decreased recall when "quized" later in session. Pt ambulated x 75' with RW with up to min assist for lateral sway, cues for LE abduction due to narrowed base of support. [place] Program initiated: seated knee extension; standing knee flexion, hip abduction, squats, sit <> stands all with min-guard assist with table for UE support. Min-guard assist for transfers, mod assist for sit > supine due to inability to lift bil. LEs into bed.   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

## 2013-02-07 NOTE — Consult Note (Signed)
WOC consult Note Reason for Consult: asked to evaluate sacrum and buttocks, pt has history of Stage II pressure ulcer/POA. Had wounds documented previously at [facility]. No history of incontinence of bowel or bladder. In rehab currently, up in [facility] and chair and ambulating with walker.  Wound type: Diffuse ulcers over the sacrum and gluteal cleft, including one noted right inner buttock. Pressure Ulcer POA: Yes Measurement: diffuse area over the sacrum: 10cm x 10cm of scattered ulcers with no significant depth, inner buttock 2.0cm x 2.0cm x 0.2 cm Wound bed: all partial thickness, with epithelial buds throughout the wound beds. Clean, pink.  Drainage (amount, consistency, odor) Nursing staff concerned increased drainage, and odor. However, I did not notice an odor today, moderate serous drainage.  Periwound:intact, some healing at the wound edges Dressing procedure/placement/frequency: add silver hydrofiber for increased drainage and potential bioburdan.  Top with silicone foam and change both every 3 days.   Add chair pressure redistribution pad, and consider mattress overlay if continues to have drainage issues.  Discussed POC with patient and bedside nurse.  Re consult if needed, will not follow at this time. Thanks  Jaz Mallick [employer], CWOCN [phone])

## 2013-02-07 NOTE — Progress Notes (Signed)
Note reviewed and accurately reflects treatment session.   

## 2013-02-07 NOTE — Progress Notes (Signed)
Occupational Therapy Session Note  Patient Details  Name: [name] MRN: [id number] Date of Birth: Sep 28, 1924  Today's Date: 02/07/2013 Time: 0900-1000 Time Calculation (min): 60 min  Short Term Goals: Week 1:  OT Short Term Goal 1 (Week 1): STG=LTG  Skilled Therapeutic Interventions/Progress Updates:    pt resting in bed upon arrival. Pt went from supine > sit  with supervision, and from bed to w/c with steady A. Pt completed 4/4 tasks for grooming with set up to obtain items. Pt completed bathing in w/c at the sink. Pt washed all of upper body, pt declined bathing lower body which made pt Max A for bathing.  Pt donned pull over shirt with set up. Pt completed 6/9 tasks (Mod A) for lower body dressing. Pt threaded R and L leg for underwear and pants, and stood to pull both over hips. For  the last 20 minutes of session practiced bed mobility with pt. Pt went from w/c to bed with steady A. Sit > supine with Mod A to lift legs. Pt unable to fully move to [facility]. Pt could roll side to side. Pt is able to scoot over in bed using bridge position but requires assistance with LLE. Pt unable to slide legs over straight in bed to scoot over in bed. Practiced therapeutic exercises in bed lifting bottom up in bridge position and holding position, with therapist supporting feet., then with one lower extremity at a time with therapist supporting feet. Pt able to maintain bridge position with both feet, and then with one foot each for at least 15 seconds. Focus of session self care, activity tolerance, bed mobility.   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

## 2013-02-07 NOTE — Progress Notes (Addendum)
Subjective/Complaints: Continues to make progress. One watery stool reported yesterday am. Frequent urination A 12 point review of systems has been performed and if not noted above is otherwise negative.   Objective: Vital Signs: Blood pressure 149/75, pulse 73, temperature 98.8 F (37.1 C), temperature source Oral, resp. rate 18, height 5\' 6"  (1.676 m), weight 85.775 kg (189 lb 1.6 oz), SpO2 98.00%. No results found.  Recent Labs  02/05/13 0500 02/07/13 0500  WBC 3.5* 3.4*  HGB 7.8* 7.7*  HCT 23.4* 22.4*  PLT 131* 120*    Recent Labs  02/05/13 0500 02/07/13 0500  NA 138 136  K 3.0* 2.7*  CL 108 106  GLUCOSE 100* 97  BUN 6 6  CREATININE 0.72 0.75  CALCIUM 7.5* 7.8*   CBG (last 3)  No results found for this basename: GLUCAP,  in the last 72 hours  Wt Readings from Last 3 Encounters:  02/07/13 85.775 kg (189 lb 1.6 oz)  01/27/13 80.4 kg (177 lb 4 oz)  01/27/13 80.4 kg (177 lb 4 oz)    Physical Exam:  Constitutional: She is oriented to person, place, and time. She appears well-developed and well-nourished.  HENT:  Head: Normocephalic and atraumatic.  Eyes: EOM are normal.  Neck: Normal range of motion. Neck supple. No JVD present. No tracheal deviation present. No thyromegaly present.  Cardiovascular: Normal rate and regular rhythm. Small systolic murmur No murmur heard.  Pulmonary/Chest: Effort normal and breath sounds normal. No respiratory distress. She has no wheezes. She has no rales.  Abdominal: Soft. Bowel sounds are normal. She exhibits no distension. There is no tenderness. There is no rebound.  Lymphadenopathy:  She has no cervical adenopathy.  Neurological: She is alert and oriented to person, place, and time. No cranial nerve deficit. Coordination normal.  Moved all 4's. No sensory loss distally. Strength 4- deltoid, biceps, triceps, 4/5 HI. LE 3/5 HF, KE 3+, Feet 4/5.  Skin: Skin is warm and dry. Are of superficial breakdown over the sacral  region/buttocks, approx 3x2in and second is 2x2, small area inferior as well. All are shallow with small amount of fibronecrotic tissue at surface, some odor.  Psychiatric: She has a normal mood and affect. Her behavior is normal. Thought content normal   Assessment/Plan: 1. 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 Physiatrist is providing close team supervision and 24 hour management of active medical problems listed below. Physiatrist and rehab team continue to assess barriers to discharge/monitor patient progress toward functional and medical goals. FIM: FIM - Bathing Bathing Steps Patient Completed: Chest;Right Arm;Left Arm;Buttocks;Front perineal area;Abdomen;Right upper leg;Left upper leg;Right lower leg (including foot);Left lower leg (including foot) Bathing: 4: Min-Patient completes 8-9 61f 10 parts or 75+ percent  FIM - Upper Body Dressing/Undressing Upper body dressing/undressing steps patient completed: Thread/unthread right sleeve of pullover shirt/dresss;Thread/unthread left sleeve of pullover shirt/dress;Put head through opening of pull over shirt/dress;Pull shirt over trunk Upper body dressing/undressing: 5: Set-up assist to: Obtain clothing/put away FIM - Lower Body Dressing/Undressing Lower body dressing/undressing steps patient completed: Thread/unthread right underwear leg;Thread/unthread left underwear leg;Pull underwear up/down;Thread/unthread right pants leg;Thread/unthread left pants leg;Pull pants up/down;Don/Doff right sock Lower body dressing/undressing: 4: Min-Patient completed 75 plus % of tasks  FIM - Toileting Toileting steps completed by patient: Adjust clothing prior to toileting;Performs perineal hygiene;Adjust clothing after toileting Toileting Assistive Devices: Grab bar or rail for support Toileting: 4: Steadying  assist  FIM - Diplomatic Services operational officer Devices: Grab bars Toilet Transfers: 4-To toilet/BSC: Min A (steadying Pt. > 75%);4-From toilet/BSC: Min A (steadying Pt. > 75%)  FIM - Bed/Chair Transfer Bed/Chair Transfer Assistive Devices: Therapist, occupational: 5: Supine > Sit: Supervision (verbal cues/safety issues);4: Sit > Supine: Min A (steadying pt. > 75%/lift 1 leg);4: Bed > Chair or W/C: Min A (steadying Pt. > 75%);4: Chair or W/C > Bed: Min A (steadying Pt. > 75%)  FIM - Locomotion: Wheelchair Distance: 80' Locomotion: Wheelchair: 2: Travels 50 - 149 ft with supervision, cueing or coaxing FIM - Locomotion: Ambulation Locomotion: Ambulation Assistive Devices: Designer, industrial/product (and rollator and without AD) Ambulation/Gait Assistance: 3: Mod assist;4: Min assist Locomotion: Ambulation: 2: Travels 50 - 149 ft with minimal assistance (Pt.>75%)  Comprehension Comprehension Mode: Auditory Comprehension: 6-Follows complex conversation/direction: With extra time/assistive device  Expression Expression Mode: Verbal Expression: 6-Expresses complex ideas: With extra time/assistive device  Social Interaction Social Interaction: 7-Interacts appropriately with others - No medications needed.  Problem Solving Problem Solving: 7-Solves complex problems: Recognizes & self-corrects  Memory Memory: 7-Complete Independence: No helper Medical Problem List and Plan:  1. 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 DVT Prophylaxis/Anticoagulation: Recent findings of left DVT 01/14/2013. Coumadin discontinued suspect GI bleed with IVC filter placed 01/26/2013.  3. Pain Management: Tylenol as needed  4. Mood: Xanax as needed, Wellbutrin 150 mg daily. Providing emotional support   -prn trazodone to help with sleep 5. Neuropsych: This patient is capable of making decisions on his own behalf.  6. Myasthenia gravis. Continue CellCept as well as Valcyte. 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 Continue Valcyte 900 mg twice daily initiated 01/29/2013 x3 weeks then reduce to 900 mg daily   -continue to work on bowel pattern/stool consistency, imodium, probiotic  -replace potassium again and recheck tomorrow 8. Chronic anemia/GI bleed. Patient has been transfused. Monitor hemoglobin as well as platelet counts  9. Hypothyroidism. Synthroid  10. PAF. Continue Lanoxin. Cardiac rate controlled.  11. Diastolic congestive heart failure. Monitor for any signs of fluid overload. 12. Anemia: hgb 7.7 and holding steady hx of likely GI bleed on coumadin before  -serial cbc's 13. Wound care---may need enzymatic dressing to wounds  -will begin santyl, moist dressing  -wound care consult made    LOS (Days) 3 A FACE TO FACE EVALUATION WAS PERFORMED  Venson Ferencz T 02/07/2013 8:10 AM

## 2013-02-07 NOTE — Plan of Care (Signed)
Problem: RH SKIN INTEGRITY Goal: RH STG SKIN FREE OF INFECTION/BREAKDOWN No new skin breakdown/infection  Outcome: Not Progressing Patient with a foul-smelling stage 2 to buttocks-WOC consult requested

## 2013-02-08 ENCOUNTER — Encounter (HOSPITAL_COMMUNITY): Payer: Medicare Other

## 2013-02-08 ENCOUNTER — Inpatient Hospital Stay (HOSPITAL_COMMUNITY): Payer: Medicare Other | Admitting: Physical Therapy

## 2013-02-08 ENCOUNTER — Inpatient Hospital Stay (HOSPITAL_COMMUNITY): Payer: Medicare Other

## 2013-02-08 DIAGNOSIS — R5381 Other malaise: Secondary | ICD-10-CM

## 2013-02-08 DIAGNOSIS — A0839 Other viral enteritis: Secondary | ICD-10-CM

## 2013-02-08 DIAGNOSIS — D62 Acute posthemorrhagic anemia: Secondary | ICD-10-CM

## 2013-02-08 DIAGNOSIS — G7000 Myasthenia gravis without (acute) exacerbation: Secondary | ICD-10-CM

## 2013-02-08 LAB — BASIC METABOLIC PANEL
BUN: 7 mg/dL (ref 6–23)
Creatinine, Ser: 0.81 mg/dL (ref 0.50–1.10)
GFR calc Af Amer: 74 mL/min — ABNORMAL LOW (ref >90)
GFR calc non Af Amer: 64 mL/min — ABNORMAL LOW (ref >90)
Glucose, Bld: 98 mg/dL (ref 70–99)

## 2013-02-08 NOTE — Progress Notes (Signed)
NUTRITION FOLLOW UP  Intervention:   1.  Supplements; Continue Resource Breeze BID. 2.  Continue MVI daily 3.  Continue to encourage PO intake  Nutrition Dx:   Unintentional wt loss, ongoing  Monitor:   1. Food/Beverage; pt meeting >/=90% estimated needs with tolerance. Met.  2. Wt/wt change; monitor trends.  Ongoing. 3. Gastrointestinal; monitor for bowel function.  Ongoing.   Assessment:   79 y.o. female with recent hospitalization for colitis and a colonoscopy showing Colon ulcers, recently diagnosed with DVT on coumadin, Myasthenia gravis on cellcept.   Pt unavailable at time of visit. PO intake has improved to 85-100% of her meals.  Continue current interventions in place and monitor wt and intake for adequacy.    Height: Ht Readings from Last 1 Encounters:  02/04/13 5\' 6"  (1.676 m)    Weight Status:   Wt Readings from Last 1 Encounters:  02/07/13 189 lb 1.6 oz (85.775 kg)    Re-estimated needs:  Kcal: 1600-1800 Protein: 90-100g Fluid: >1.8 L/day  Skin: edema  Diet Order: Dysphagia 3, thin   Intake/Output Summary (Last 24 hours) at 02/08/13 1440 Last data filed at 02/08/13 1200  Gross per 24 hour  Intake    720 ml  Output      2 ml  Net    718 ml    Last BM: 9/18   Labs:   Recent Labs Lab 02/05/13 0500 02/07/13 0500 02/08/13 0630  NA 138 136 138  K 3.0* 2.7* 3.3*  CL 108 106 108  CO2 20 21 22   BUN 6 6 7   CREATININE 0.72 0.75 0.81  CALCIUM 7.5* 7.8* 7.7*  GLUCOSE 100* 97 98    CBG (last 3)  No results found for this basename: GLUCAP,  in the last 72 hours  Scheduled Meds: . buPROPion  150 mg Oral Daily  . digoxin  0.125 mg Oral Daily  . feeding supplement  1 Container Oral BID BM  . levothyroxine  125 mcg Oral QAC breakfast  . loperamide  2 mg Oral BID BM & HS  . multivitamin  5 mL Oral Daily  . mycophenolate  1,000 mg Oral BID  . pantoprazole  40 mg Oral BID  . potassium chloride  40 mEq Oral BID  . saccharomyces boulardii   250 mg Oral BID  . valGANciclovir  900 mg Oral BID    Continuous Infusions:   Loyce Dys, MS RD LDN Clinical Inpatient Dietitian Pager: [phone] Weekend/After hours pager: [phone]

## 2013-02-08 NOTE — Progress Notes (Signed)
Physical Therapy Session Note  Patient Details  Name: [name] MRN: [id number] Date of Birth: 05-29-1924  Today's Date: 02/08/2013 Time: 7829-5621 Time Calculation (min): 45 min  Short Term Goals: Week 1:  PT Short Term Goal 1 (Week 1): =LTG's due to anticipated short LOS.   Skilled Therapeutic Interventions/Progress Updates:    Pt on toilet upon arrival, supervision for cleaning and stand pivot to wheelchair with RW. Ambulation to gym x 150' with RW and close supervision, no physical assist needed. Pt continues with Rt. LE adduction but had no overt loss of balance. Bed mobility using leg lifter (suggested by student OT), pt supervision and able to lift bil. LEs onto mat but would benefit from more practice for efficiency. Pt reported bowel urgency, returned to room and pt had small bowel movement, supervision for bathroom mobility and narrow space neegotiation with RW. RN made aware of Lt. LE swelling/edema that was evident with comparison to Rt., no redness and did not feel hot to touch. Pt set-up for breakfast.  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

## 2013-02-08 NOTE — Progress Notes (Signed)
Physical Therapy Session Note  Patient Details  Name: [name] MRN: [id number] Date of Birth: 03-11-1925  Today's Date: 02/08/2013 Time: 9811-9147 Time Calculation (min): 60 min  Short Term Goals: Week 1:  PT Short Term Goal 1 (Week 1): =LTG's due to anticipated short LOS.   Skilled Therapeutic Interventions/Progress Updates:    pt sitting in W/C c/o feeling very tired after bathing/dressing with OT.  Sit to stand MinG.  Amb with 4WW MinG 65', 60', and 50'.  Pt needs cues for use of brakes with stand to sits.  Repeated sit to/from stands working on activity tolerance and control, 5 reps x2.  Seated UE bike x 10 mins on level 1.  Pt indicates overall feeling fatigued and needed multiple rest breaks during session.    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

## 2013-02-08 NOTE — Progress Notes (Signed)
Occupational Therapy Session Note  Patient Details  Name: [name] MRN: [id number] Date of Birth: 01/23/1925  Today's Date: 02/08/2013 Time: 1330-1400 Time Calculation (min): 30 min  Short Term Goals: Week 1:  OT Short Term Goal 1 (Week 1): STG=LTG  Skilled Therapeutic Interventions/Progress Updates: Therapeutic activities with emphasis on functional transfers, functional mobility using RW, LE strengthening, and endurance.  Patient completed functional mobility, approx 100' X 2, with supervision for safety and verbal cues for positioning during transfers.  Patient transferred to low chair in family room with supervision but required mod assist to lift from low seat and steadying assist to maintain balance while standing at RW.   Patient reported awareness of loss of balance, posteriorly, but was unable to self-correct without assistance this session.    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

## 2013-02-08 NOTE — Progress Notes (Signed)
Social Work Patient ID: [name], female   DOB: [birth year], 79 y.o.   MRN: [id number]  Have spoken with pt's daughter, Carley Hammed, and confirmed with her that tx team feels she will be ready for targeted d/c on Monday.  They would like to complete family education prior to d/c.  Have scheduled this to take place with daughter on Monday from 10-12.  Continue to follow.  Wayne Brunker, LCSW

## 2013-02-08 NOTE — Progress Notes (Signed)
Note reviewed and accurately reflects treatment session.   

## 2013-02-08 NOTE — Progress Notes (Signed)
Subjective/Complaints: Stool consistency a little better. Still feels that left leg is a little "weak" A 12 point review of systems has been performed and if not noted above is otherwise negative.   Objective: Vital Signs: Blood pressure 112/54, pulse 69, temperature 97.7 F (36.5 C), temperature source Oral, resp. rate 17, height 5\' 6"  (1.676 m), weight 85.775 kg (189 lb 1.6 oz), SpO2 99.00%. No results found.  Recent Labs  02/07/13 0500  WBC 3.4*  HGB 7.7*  HCT 22.4*  PLT 120*    Recent Labs  02/07/13 0500 02/08/13 0630  NA 136 138  K 2.7* 3.3*  CL 106 108  GLUCOSE 97 98  BUN 6 7  CREATININE 0.75 0.81  CALCIUM 7.8* 7.7*   CBG (last 3)  No results found for this basename: GLUCAP,  in the last 72 hours  Wt Readings from Last 3 Encounters:  02/07/13 85.775 kg (189 lb 1.6 oz)  01/27/13 80.4 kg (177 lb 4 oz)  01/27/13 80.4 kg (177 lb 4 oz)    Physical Exam:  Constitutional: She is oriented to person, place, and time. She appears well-developed and well-nourished.  HENT:  Head: Normocephalic and atraumatic.  Eyes: EOM are normal.  Neck: Normal range of motion. Neck supple. No JVD present. No tracheal deviation present. No thyromegaly present.  Cardiovascular: Normal rate and regular rhythm. Small systolic murmur No murmur heard.  Pulmonary/Chest: Effort normal and breath sounds normal. No respiratory distress. She has no wheezes. She has no rales.  Abdominal: Soft. Bowel sounds are normal. She exhibits no distension. There is no tenderness. There is no rebound.  Lymphadenopathy:  She has no cervical adenopathy.  Neurological: She is alert and oriented to person, place, and time. No cranial nerve deficit. Coordination normal.  Moved all 4's. No sensory loss distally. Strength 4- deltoid, biceps, triceps, 4/5 HI. LE 3/5 HF, KE 3+, Feet 4/5.  Skin: Skin is warm and dry. Are of superficial breakdown over the sacral region/buttocks, approx 3x2in and second is 2x2, small  area inferior as well. All are shallow with  fibronecrotic tissue at surface mostly gone now. No odor.  Psychiatric: She has a normal mood and affect. Her behavior is normal. Thought content normal   Assessment/Plan: 1. 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 Physiatrist is providing close team supervision and 24 hour management of active medical problems listed below. Physiatrist and rehab team continue to assess barriers to discharge/monitor patient progress toward functional and medical goals. FIM: FIM - Bathing Bathing Steps Patient Completed: Chest;Right Arm;Left Arm;Abdomen Bathing: 2: Max-Patient completes 3-4 87f 10 parts or 25-49%  FIM - Upper Body Dressing/Undressing Upper body dressing/undressing steps patient completed: Thread/unthread right sleeve of pullover shirt/dresss;Thread/unthread left sleeve of pullover shirt/dress;Put head through opening of pull over shirt/dress;Pull shirt over trunk Upper body dressing/undressing: 5: Set-up assist to: Obtain clothing/put away FIM - Lower Body Dressing/Undressing Lower body dressing/undressing steps patient completed: Thread/unthread right underwear leg;Thread/unthread left underwear leg;Pull underwear up/down;Thread/unthread right pants leg;Thread/unthread left pants leg;Pull pants up/down Lower body dressing/undressing: 3: Mod-Patient completed 50-74% of tasks  FIM - Toileting Toileting steps completed by patient: Adjust clothing prior to toileting;Performs perineal hygiene;Adjust clothing after toileting Toileting Assistive Devices: Grab bar or rail for support Toileting: 4: Steadying assist  FIM - Diplomatic Services operational officer Devices: Grab bars Toilet Transfers: 4-To toilet/BSC: Min A (steadying Pt. > 75%);4-From  toilet/BSC: Min A (steadying Pt. > 75%)  FIM - Bed/Chair  Transfer Bed/Chair Transfer Assistive Devices: Manufacturing systems engineer Transfer: 3: Sit > Supine: Mod A (lifting assist/Pt. 50-74%/lift 2 legs);4: Chair or W/C > Bed: Min A (steadying Pt. > 75%)  FIM - Locomotion: Wheelchair Distance: 80' Locomotion: Wheelchair: 2: Travels 50 - 149 ft with supervision, cueing or coaxing FIM - Locomotion: Ambulation Locomotion: Ambulation Assistive Devices: Designer, industrial/product Ambulation/Gait Assistance: 4: Min assist Locomotion: Ambulation: 0: Activity did not occur  Comprehension Comprehension Mode: Auditory Comprehension: 6-Follows complex conversation/direction: With extra time/assistive device  Expression Expression Mode: Verbal Expression: 6-Expresses complex ideas: With extra time/assistive device  Social Interaction Social Interaction: 7-Interacts appropriately with others - No medications needed.  Problem Solving Problem Solving: 7-Solves complex problems: Recognizes & self-corrects  Memory Memory: 7-Complete Independence: No helper Medical Problem List and Plan:  1. 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 DVT Prophylaxis/Anticoagulation: Recent findings of LLE DVT 01/14/2013. Coumadin discontinued suspect GI bleed with IVC filter placed 01/26/2013.   -will need to deal with edema symptomatically 3. Pain Management: Tylenol as needed  4. Mood: Xanax as needed, Wellbutrin 150 mg daily. Providing emotional support   -prn trazodone to help with sleep 5. Neuropsych: This patient is capable of making decisions on his own behalf.  6. Myasthenia gravis. Continue CellCept as well as Valcyte. 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 Continue Valcyte 900 mg twice daily initiated 01/29/2013 x3 weeks then reduce to 900 mg daily   -continue to work on bowel pattern/stool consistency, imodium, probiotic  -replacing potassium (back to 3.3 today) 8. Chronic anemia/GI bleed. Patient  has been transfused. Monitor hemoglobin as well as platelet counts  9. Hypothyroidism. Synthroid  10. PAF. Continue Lanoxin. Cardiac rate controlled.  11. Diastolic congestive heart failure. Monitor for any signs of fluid overload. 12. Anemia: hgb 7.7 and holding steady hx of likely GI bleed on coumadin before  -serial cbc's 13. Wound care---   -wounds look better today--they are very superficial  -wound care consult appreciate    LOS (Days) 4 A FACE TO FACE EVALUATION WAS PERFORMED  Rito Lecomte T 02/08/2013 7:44 AM

## 2013-02-08 NOTE — Plan of Care (Signed)
Problem: RH SKIN INTEGRITY Goal: RH STG SKIN FREE OF INFECTION/BREAKDOWN No new skin breakdown/infection with assistance. Total A Outcome: Progressing Stage 2 x 3 with silver dressing and Allevyn dressing to be applied to area q 3 days. Pt will require assistance with dressing change due to location

## 2013-02-08 NOTE — Progress Notes (Signed)
Occupational Therapy Session Note  Patient Details  Name: [name] MRN: [id number] Date of Birth: November 24, 1924  Today's Date: 02/08/2013 Time: 0900-1000 Time Calculation (min): 60 min  Short Term Goals: Week 1:  OT Short Term Goal 1 (Week 1): STG=LTG  Skilled Therapeutic Interventions/Progress Updates:    pt sitting in w/c upon arrival. Pt completed grooming tasks sitting in w/c with set up. Pt went from w/c > toilet with Min A. Pt completed 3/3 steps for toiletting. Pt ambulated with RW from toilet > shower bench with steady A. Pt completed 9/10 parts for bathing, pt unable to wash bottom due to wound (all wounds ( port cath, and bottom) were covered with plastic shower dressing and taping for reinforcement.)  Pt used long handled sponge to wash feet. Pt went from shower bench to w/c with Min A. Pt completed upper body dressing with set up, and lower body dressing with Mod A pt able to thread R and L legs in underwear and pants, and pull both over hips while standing from w/c and using sink for upper extremity support. Noticed pt legs looked swollen and ted hose may be to tight, reported to nurse, nurse measured legs and checked for swelling. Nursing also changed dressing on bottom. Left pt sitting in w/c with call bell in reach.  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

## 2013-02-09 ENCOUNTER — Inpatient Hospital Stay (HOSPITAL_COMMUNITY): Payer: Medicare Other | Admitting: Physical Therapy

## 2013-02-09 ENCOUNTER — Encounter (HOSPITAL_COMMUNITY): Payer: Medicare Other | Admitting: Occupational Therapy

## 2013-02-09 ENCOUNTER — Inpatient Hospital Stay (HOSPITAL_COMMUNITY): Payer: Medicare Other | Admitting: Occupational Therapy

## 2013-02-09 DIAGNOSIS — A0839 Other viral enteritis: Secondary | ICD-10-CM

## 2013-02-09 DIAGNOSIS — G7000 Myasthenia gravis without (acute) exacerbation: Secondary | ICD-10-CM

## 2013-02-09 DIAGNOSIS — D62 Acute posthemorrhagic anemia: Secondary | ICD-10-CM

## 2013-02-09 DIAGNOSIS — R5381 Other malaise: Secondary | ICD-10-CM

## 2013-02-09 LAB — BASIC METABOLIC PANEL
BUN: 7 mg/dL (ref 6–23)
CO2: 21 mEq/L (ref 19–32)
Calcium: 8.5 mg/dL (ref 8.4–10.5)
Chloride: 106 mEq/L (ref 96–112)
Creatinine, Ser: 0.74 mg/dL (ref 0.50–1.10)
GFR calc Af Amer: 87 mL/min — ABNORMAL LOW (ref >90)

## 2013-02-09 MED ORDER — SODIUM CHLORIDE 0.45 % IV SOLN
INTRAVENOUS | Status: DC
  Administered 2013-02-09: 22:00:00 via INTRAVENOUS

## 2013-02-09 MED ORDER — LORAZEPAM 2 MG/ML IJ SOLN
0.2500 mg | Freq: Once | INTRAMUSCULAR | Status: AC
  Administered 2013-02-09: 0.25 mg via INTRAMUSCULAR
  Filled 2013-02-09: qty 1

## 2013-02-09 MED ORDER — PROCHLORPERAZINE EDISYLATE 5 MG/ML IJ SOLN
10.0000 mg | Freq: Four times a day (QID) | INTRAMUSCULAR | Status: DC | PRN
  Administered 2013-02-09: 10 mg via INTRAVENOUS
  Filled 2013-02-09 (×2): qty 2

## 2013-02-09 NOTE — Progress Notes (Signed)
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

## 2013-02-09 NOTE — Progress Notes (Signed)
Subjective/Complaints: Stool consistency a little better. Still feels that left leg is a little "weak" A 12 point review of systems has been performed and if not noted above is otherwise negative.   Objective: Vital Signs: Blood pressure 140/80, pulse 88, temperature 97.4 F (36.3 C), temperature source Oral, resp. rate 18, height 5\' 6"  (1.676 m), weight 85.775 kg (189 lb 1.6 oz), SpO2 95.00%. No results found.  Recent Labs  02/07/13 0500  WBC 3.4*  HGB 7.7*  HCT 22.4*  PLT 120*    Recent Labs  02/07/13 0500 02/08/13 0630  NA 136 138  K 2.7* 3.3*  CL 106 108  GLUCOSE 97 98  BUN 6 7  CREATININE 0.75 0.81  CALCIUM 7.8* 7.7*   CBG (last 3)  No results found for this basename: GLUCAP,  in the last 72 hours  Wt Readings from Last 3 Encounters:  02/07/13 85.775 kg (189 lb 1.6 oz)  01/27/13 80.4 kg (177 lb 4 oz)  01/27/13 80.4 kg (177 lb 4 oz)    Physical Exam:  Constitutional: She is oriented to person, place, and time. She appears well-developed and well-nourished.  HENT:  Head: Normocephalic and atraumatic.  Eyes: EOM are normal.  Neck: Normal range of motion. Neck supple. No JVD present. No tracheal deviation present. No thyromegaly present.  Cardiovascular: Normal rate and regular rhythm. Small systolic murmur No murmur heard.  Pulmonary/Chest: Effort normal and breath sounds normal. No respiratory distress. She has no wheezes. She has no rales.  Abdominal: Soft. Bowel sounds are normal. She exhibits no distension. There is no tenderness. There is no rebound.  Lymphadenopathy:  She has no cervical adenopathy.  Neurological: She is alert and oriented to person, place, and time. No cranial nerve deficit. Coordination normal.  Moved all 4's. No sensory loss distally. Strength 4- deltoid, biceps, triceps, 4/5 HI. LE 3/5 HF, KE 3+, Feet 4/5.  Skin: Skin is warm and dry.Psychiatric: She has a normal mood and affect. Her behavior is normal. Thought content normal Ext:   1+ R, 2+ left pretib edema  Assessment/Plan: 1. 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 Physiatrist is providing close team supervision and 24 hour management of active medical problems listed below. Physiatrist and rehab team continue to assess barriers to discharge/monitor patient progress toward functional and medical goals. FIM: FIM - Bathing Bathing Steps Patient Completed: Right Arm;Left Arm;Abdomen;Front perineal area;Right upper leg;Left upper leg;Left lower leg (including foot);Chest;Right lower leg (including foot) Bathing: 4: Min-Patient completes 8-9 74f 10 parts or 75+ percent  FIM - Upper Body Dressing/Undressing Upper body dressing/undressing steps patient completed: Thread/unthread right sleeve of pullover shirt/dresss;Thread/unthread left sleeve of pullover shirt/dress;Put head through opening of pull over shirt/dress;Pull shirt over trunk Upper body dressing/undressing: 5: Set-up assist to: Obtain clothing/put away FIM - Lower Body Dressing/Undressing Lower body dressing/undressing steps patient completed: Pull pants up/down;Pull underwear up/down;Thread/unthread right pants leg;Thread/unthread left pants leg;Thread/unthread left underwear leg;Thread/unthread right underwear leg Lower body dressing/undressing: 3: Mod-Patient completed 50-74% of tasks  FIM - Toileting Toileting steps completed by patient: Adjust clothing prior to toileting;Performs perineal hygiene;Adjust clothing after toileting Toileting Assistive Devices: Grab bar or rail for support Toileting: 4: Steadying assist  FIM - Diplomatic Services operational officer Devices: Grab bars;Walker Toilet Transfers: 5-To toilet/BSC: Supervision (verbal cues/safety issues);5-From toilet/BSC: Supervision (verbal cues/safety issues)  FIM - Event organiser Devices:  Walker Bed/Chair Transfer: 4: Bed > Chair or W/C: Min A (steadying Pt. > 75%);4: Chair or W/C > Bed: Min A (steadying Pt. > 75%)  FIM - Locomotion: Wheelchair Distance: 80' Locomotion: Wheelchair: 2: Travels 50 - 149 ft with supervision, cueing or coaxing FIM - Locomotion: Ambulation Locomotion: Ambulation Assistive Devices: Designer, industrial/product Ambulation/Gait Assistance: 5: Supervision Locomotion: Ambulation: 5: Travels 150 ft or more with supervision/safety issues  Comprehension Comprehension Mode: Auditory Comprehension: 6-Follows complex conversation/direction: With extra time/assistive device  Expression Expression Mode: Verbal Expression: 6-Expresses complex ideas: With extra time/assistive device  Social Interaction Social Interaction: 7-Interacts appropriately with others - No medications needed.  Problem Solving Problem Solving: 7-Solves complex problems: Recognizes & self-corrects  Memory Memory: 7-Complete Independence: No helper Medical Problem List and Plan:  1. 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 DVT Prophylaxis/Anticoagulation: Recent findings of LLE DVT 01/14/2013. Coumadin discontinued suspect GI bleed with IVC filter placed 01/26/2013.   -will need to deal with edema symptomatically 3. Pain Management: Tylenol as needed  4. Mood: Xanax as needed, Wellbutrin 150 mg daily. Providing emotional support   -prn trazodone to help with sleep 5. Neuropsych: This patient is capable of making decisions on his own behalf.  6. Myasthenia gravis. Continue CellCept as well as Valcyte. 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 Continue Valcyte 900 mg twice daily initiated 01/29/2013 x3 weeks then reduce to 900 mg daily   -continue to work on bowel pattern/stool consistency, imodium, probiotic  -replacing potassium (back to 3.3 today) 8. Chronic anemia/GI bleed. Patient  has been transfused. Monitor hemoglobin as well as platelet counts  9. Hypothyroidism. Synthroid  10. PAF. Continue Lanoxin. Cardiac rate controlled.  11. Diastolic congestive heart failure. Monitor for any signs of fluid overload. 12. Anemia: hgb 7.7 and holding steady hx of likely GI bleed on coumadin before  -serial cbc's 13. Wound care---   -wounds look better today--they are very superficial  -wound care consult appreciate  14.  Lower ext edema L>R DVT , IVC filter  LOS (Days) 5 A FACE TO FACE EVALUATION WAS PERFORMED  KIRSTEINS,ANDREW E 02/09/2013 9:05 AM

## 2013-02-09 NOTE — Progress Notes (Signed)
Physical Therapy Session Note  Patient Details  Name: [name] MRN: [id number] Date of Birth: 1924-08-12  Today's Date: 02/09/2013 Time: 7829-5621 Time Calculation (min): 45 min  Short Term Goals: Week 1:  PT Short Term Goal 1 (Week 1): =LTG's due to anticipated short LOS.    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

## 2013-02-09 NOTE — Progress Notes (Signed)
Physical Therapy Session Note  Patient Details  Name: [name] MRN: [id number] Date of Birth: [birth year]  Today's Date: 02/09/2013 Time: 7829-5621 Time Calculation (min): 45 min  Short Term Goals: Week 1:  PT Short Term Goal 1 (Week 1): =LTG's due to anticipated short LOS.   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 Nursing getting patient BTB after attempt of sitting on toilet and due to feeling shakey. Vitals fine.  Therapeutic Activity:(45') encouraged some sipping of water for peristaltic encouragement into the bowels. Patient applying gentle pressure to abdominal region.    Bed mobility S/min-A rolling onto R side and Mod-A into sitting at EOB. Transfer bed<->w/c with min-A, w/c<->toilet with min-A using grab bar. Patient on toilet for a while and was able to produce small BM with nursing notified. Assisted patient BTB and patient became nauseous and had emesis. NA available to assist with patient and Nursing notified.     Therapy/Group: Individual Therapy  Rex Kras 02/09/2013, 5:41 PM

## 2013-02-09 NOTE — Progress Notes (Signed)
Occupational Therapy Session Note  Patient Details  Name: [name] MRN: [id number] Date of Birth: 10/07/1924  Today's Date: 02/09/2013 Time: 0730-0830 Time Calculation (min): 60 min  Skilled Therapeutic Interventions/Progress Updates:AM session: Patient stated, "My left leg some times is really, really heavy, and it is hard to pick pick up right now."  Patient sitting in chair waiting for someone to help her move her w/c and table to eat breakfast.  Patient said she needed to eat before completing ADL.  Patient required part of therapy time to complete her breakfast.  So, time left was focused on LB b/d and standing balance.  Patient needed moderate assistance to wash/dress the left leg as she stated her left leg was weak and "heavy" today.       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

## 2013-02-10 ENCOUNTER — Inpatient Hospital Stay (HOSPITAL_COMMUNITY): Payer: Medicare Other | Admitting: Physical Therapy

## 2013-02-10 ENCOUNTER — Inpatient Hospital Stay (HOSPITAL_COMMUNITY): Payer: Medicare Other

## 2013-02-10 LAB — CBC WITH DIFFERENTIAL/PLATELET
Basophils Absolute: 0 10*3/uL (ref 0.0–0.1)
Eosinophils Absolute: 0 10*3/uL (ref 0.0–0.7)
HCT: 23 % — ABNORMAL LOW (ref 36.0–46.0)
Lymphs Abs: 0.4 10*3/uL — ABNORMAL LOW (ref 0.7–4.0)
MCHC: 33.5 g/dL (ref 30.0–36.0)
MCV: 86.5 fL (ref 78.0–100.0)
Monocytes Relative: 2 % — ABNORMAL LOW (ref 3–12)
Neutro Abs: 9.2 10*3/uL — ABNORMAL HIGH (ref 1.7–7.7)
Platelets: 90 10*3/uL — ABNORMAL LOW (ref 150–400)
RDW: 17.5 % — ABNORMAL HIGH (ref 11.5–15.5)
WBC: 9.8 10*3/uL (ref 4.0–10.5)

## 2013-02-10 MED ORDER — KCL IN DEXTROSE-NACL 20-5-0.45 MEQ/L-%-% IV SOLN
INTRAVENOUS | Status: DC
  Administered 2013-02-10: 09:00:00 via INTRAVENOUS
  Filled 2013-02-10 (×3): qty 1000

## 2013-02-10 MED ORDER — LOPERAMIDE HCL 2 MG PO CAPS
2.0000 mg | ORAL_CAPSULE | Freq: Once | ORAL | Status: AC
  Administered 2013-02-10: 2 mg via ORAL

## 2013-02-10 NOTE — Plan of Care (Signed)
Problem: RH SKIN INTEGRITY Goal: RH STG SKIN FREE OF INFECTION/BREAKDOWN No new skin breakdown/infection with assistance. Total A  Outcome: Progressing Requires total assistance.

## 2013-02-10 NOTE — Discharge Summary (Signed)
Discharge summary job # [id number]

## 2013-02-10 NOTE — Progress Notes (Signed)
Physical Therapy Note  Patient Details  Name: [name] MRN: [id number] Date of Birth: [birth year] Today's Date: 02/10/2013  1330-1400 (30 minutes) individual Pain: no reported pain Other: no c/o nausea today Focus of treatment: gait training/ activity tolerance Treatment: gait 160 feet X 2 rollator close SBA ; pt unable to complete third gait secondary to fatigue. Pt required vcs for hand placement sit to stand.    Isayah Ignasiak,JIM 02/10/2013, 3:17 PM

## 2013-02-10 NOTE — Progress Notes (Signed)
Pt was complaining of nausea. Zofran 4mg  PO given. Pt vomited and medicine was not held down.  IV Zofran given with no relief of nausea. MD called and new orders received for Compazine 10 mg IV q 6 hrs PRN, Ativan 0.25 mg IM, and 0.45% NS @ 50 cc/hr. Lab orders received: BMET and CBC with diff. After receiving compazine and ativan, pt fell asleep and has had no further complaints. Will continue to monitor.

## 2013-02-10 NOTE — Progress Notes (Signed)
Subjective/Complaints: Vomited on multiple occasions yesterday. I was notified yesterday evening about this. Patient did not respond well to Zofran however responded well to compensate. He is on IV fluids currently. Was not able to tolerate any medications yesterday by mouth. Poor therapy participation yesterday. A 12 point review of systems has been performed and if not noted above is otherwise negative.   Objective: Vital Signs: Blood pressure 90/50, pulse 89, temperature 99.9 F (37.7 C), temperature source Oral, resp. rate 18, height 5\' 6"  (1.676 m), weight 85.775 kg (189 lb 1.6 oz), SpO2 95.00%. Dg Abd 1 View  02/10/2013   CLINICAL DATA:  Abdominal pain, vomiting  EXAM: ABDOMEN - 1 VIEW  COMPARISON:  CT abdomen pelvis dated 04/07/2009  FINDINGS: Nonobstructive bowel gas pattern.  IVC filter at L2-3.  Gallbladder wall calcifications (porcelain gallbladder).  Degenerative changes of the visualized thoracolumbar spine with mild lumbar levoscoliosis.  IMPRESSION: No evidence of bowel obstruction.  Gallbladder wall calcifications (porcelain gallbladder).   Electronically Signed   By: Charline Bills M.D.   On: 02/10/2013 08:02    Recent Labs  02/09/13 2300  WBC 9.8  HGB 7.7*  HCT 23.0*  PLT 90*    Recent Labs  02/08/13 0630 02/09/13 2200  NA 138 140  K 3.3* 4.0  CL 108 106  GLUCOSE 98 208*  BUN 7 7  CREATININE 0.81 0.74  CALCIUM 7.7* 8.5   CBG (last 3)  No results found for this basename: GLUCAP,  in the last 72 hours  Wt Readings from Last 3 Encounters:  02/07/13 85.775 kg (189 lb 1.6 oz)  01/27/13 80.4 kg (177 lb 4 oz)  01/27/13 80.4 kg (177 lb 4 oz)    Physical Exam:  Constitutional: She is oriented to person, place, and time. She appears well-developed and well-nourished.  HENT:  Head: Normocephalic and atraumatic.  Eyes: EOM are normal.  Neck: Normal range of motion. Neck supple. No JVD present. No tracheal deviation present. No thyromegaly present.   Cardiovascular: Normal rate and regular rhythm. Small systolic murmur No murmur heard.  Pulmonary/Chest: Effort normal and breath sounds normal. No respiratory distress. She has no wheezes. She has no rales.  Abdominal: Soft. Bowel sounds are normal. She exhibits no distension. There is no tenderness. There is no rebound.   no evidence of guarding.  Neurological: She is alert and oriented to person, place, and time. No cranial nerve deficit. Coordination normal.  Moved all 4's. No sensory loss distally. Strength 4- deltoid, biceps, triceps, 4/5 HI. LE 3/5 HF, KE 3+, Feet 4/5.  Skin: Skin is warm and dry.Psychiatric: She has a normal mood and affect. Her behavior is normal. Thought content normal Ext:  1+ R, 2+ left pretib edema  Assessment/Plan: 1. 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 Physiatrist is providing close team supervision and 24 hour management of active medical problems listed below. Physiatrist and rehab team continue to assess barriers to discharge/monitor patient progress toward functional and medical goals. FIM: FIM - Bathing Bathing Steps Patient Completed: Chest;Right Arm;Left Arm;Abdomen;Right upper leg;Left upper leg Bathing: 3: Mod-Patient completes 5-7 4f 10 parts or 50-74%  FIM - Upper Body Dressing/Undressing Upper body dressing/undressing steps patient completed: Thread/unthread right sleeve of pullover shirt/dresss;Pull shirt over trunk;Put head through opening of pull over shirt/dress;Thread/unthread left sleeve of pullover shirt/dress Upper body dressing/undressing: 5: Set-up assist to: Obtain clothing/put away FIM -  Lower Body Dressing/Undressing Lower body dressing/undressing steps patient completed: Thread/unthread right underwear leg;Thread/unthread right pants leg;Fasten/unfasten pants Lower body  dressing/undressing: 2: Max-Patient completed 25-49% of tasks  FIM - Toileting Toileting steps completed by patient: Adjust clothing prior to toileting;Performs perineal hygiene;Adjust clothing after toileting Toileting Assistive Devices: Grab bar or rail for support Toileting: 0: Activity did not occur  FIM - Diplomatic Services operational officer Devices: Grab bars;Walker Event organiser: 0-Activity did not occur  FIM - Banker Devices: Therapist, occupational: 4: Bed > Chair or W/C: Min A (steadying Pt. > 75%);4: Chair or W/C > Bed: Min A (steadying Pt. > 75%)  FIM - Locomotion: Wheelchair Distance: 80' Locomotion: Wheelchair: 2: Travels 50 - 149 ft with supervision, cueing or coaxing FIM - Locomotion: Ambulation Locomotion: Ambulation Assistive Devices: Designer, industrial/product Ambulation/Gait Assistance: 5: Supervision Locomotion: Ambulation: 5: Travels 150 ft or more with supervision/safety issues  Comprehension Comprehension Mode: Auditory Comprehension: 6-Follows complex conversation/direction: With extra time/assistive device  Expression Expression Mode: Verbal Expression: 6-Expresses complex ideas: With extra time/assistive device  Social Interaction Social Interaction: 6-Interacts appropriately with others with medication or extra time (anti-anxiety, antidepressant).  Problem Solving Problem Solving: 6-Solves complex problems: With extra time  Memory Memory: 6-More than reasonable amt of time Medical Problem List and Plan:  1. 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 DVT Prophylaxis/Anticoagulation: Recent findings of LLE DVT 01/14/2013. Coumadin discontinued suspect GI bleed with IVC filter placed 01/26/2013.   -will need to deal with edema symptomatically 3. Pain Management: Tylenol as needed  4. Mood: Xanax as needed, Wellbutrin 150 mg daily. Providing emotional support   -prn trazodone to help with sleep 5.  Neuropsych: This patient is capable of making decisions on his own behalf.  6. Myasthenia gravis. Continue CellCept as well as Valcyte. 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 Continue Valcyte 900 mg twice daily initiated 01/29/2013 x3 weeks then reduce to 900 mg daily   -continue to work on bowel pattern/stool consistency, imodium, probiotic  -replacing potassium (back to 3.3 today) 8. Chronic anemia/GI bleed. Patient has been transfused. Monitor hemoglobin as well as platelet counts  9. Hypothyroidism. Synthroid  10. PAF. Continue Lanoxin. Cardiac rate controlled.  11. Diastolic congestive heart failure. Monitor for any signs of fluid overload. 12. Anemia: hgb 7.7 and holding steady hx of likely GI bleed on coumadin before  -serial cbc's 13. Wound care---   -wounds look better today--they are very superficial  -wound care consult appreciate  14.  Lower ext edema L>R DVT , IVC filter 15. Nausea no evidence of obstruction. Abdominal exam is unremarkable. May be medication related or colitis related  Will simplify medication regimen LOS (Days) 6 A FACE TO FACE EVALUATION WAS PERFORMED  KIRSTEINS,ANDREW E 02/10/2013 10:07 AM

## 2013-02-11 ENCOUNTER — Inpatient Hospital Stay (HOSPITAL_COMMUNITY): Payer: Medicare Other

## 2013-02-11 ENCOUNTER — Inpatient Hospital Stay (HOSPITAL_COMMUNITY): Payer: Medicare Other | Admitting: Physical Therapy

## 2013-02-11 LAB — BASIC METABOLIC PANEL
BUN: 10 mg/dL (ref 6–23)
CO2: 23 mEq/L (ref 19–32)
Calcium: 7.6 mg/dL — ABNORMAL LOW (ref 8.4–10.5)
GFR calc non Af Amer: 73 mL/min — ABNORMAL LOW (ref >90)
Glucose, Bld: 108 mg/dL — ABNORMAL HIGH (ref 70–99)
Potassium: 3.1 mEq/L — ABNORMAL LOW (ref 3.5–5.1)

## 2013-02-11 MED ORDER — POTASSIUM CHLORIDE CRYS ER 20 MEQ PO TBCR
20.0000 meq | EXTENDED_RELEASE_TABLET | Freq: Two times a day (BID) | ORAL | Status: DC
  Administered 2013-02-11 – 2013-02-12 (×3): 20 meq via ORAL
  Filled 2013-02-11 (×5): qty 1

## 2013-02-11 MED ORDER — LOPERAMIDE HCL 2 MG PO CAPS
2.0000 mg | ORAL_CAPSULE | Freq: Every day | ORAL | Status: DC
  Administered 2013-02-11: 2 mg via ORAL
  Filled 2013-02-11 (×2): qty 1

## 2013-02-11 NOTE — Progress Notes (Signed)
Social Work Patient ID: [name], female   DOB: Sep 14, 1924, 79 y.o.   MRN: [id number]  Pt d/c delayed x 1 day due to medical issues over weekend.  Family to complete education tomorrow morning prior to d/c.    Shaunita Seney, LCSW

## 2013-02-11 NOTE — Progress Notes (Signed)
Note reviewed and accurately reflects treatment session.   

## 2013-02-11 NOTE — Progress Notes (Signed)
Occupational Therapy Session Note  Patient Details  Name: [name] MRN: [id number] Date of Birth: [birth year]  Today's Date: 02/11/2013 Time: 1000-1045 Time Calculation (min): 45 min  Short Term Goals: Week 1:  OT Short Term Goal 1 (Week 1): STG=LTG  Skilled Therapeutic Interventions/Progress Updates:    pt sitting in w/c upon arrival with daughter and son present. Pt engaged in bathing tasks at the sink while sitting in w/c. Pt washed 5/10 parts (mod A) for bathing pt declined washing all of lower body. Pt donned pull over shirt with set up. Pt completed lower body dressing with Min A standing to pull underwear and pants over hips. Pt ambulated with RW to toilet and performed 3/3 toiletting tasks with supervision. Pt donned socks, and fastened shoes. Educated pt and family on use of reacher to assist with LLE in threading underwear and pants. Answered both pt and family questions to their satisfaction. Focus of session activity tolerance, dynamic standing balance, and self care. Left pt in room with all needs son and daughter still present, and PT entering room upon end of session.   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

## 2013-02-11 NOTE — Progress Notes (Signed)
Subjective/Complaints: Sunday was much better than Saturday. One loose stool yesterday. A 12 point review of systems has been performed and if not noted above is otherwise negative.   Objective: Vital Signs: Blood pressure 148/73, pulse 66, temperature 98.2 F (36.8 C), temperature source Oral, resp. rate 17, height 5\' 6"  (1.676 m), weight 85.775 kg (189 lb 1.6 oz), SpO2 95.00%. Dg Abd 1 View  02/10/2013   CLINICAL DATA:  Abdominal pain, vomiting  EXAM: ABDOMEN - 1 VIEW  COMPARISON:  CT abdomen pelvis dated 04/07/2009  FINDINGS: Nonobstructive bowel gas pattern.  IVC filter at L2-3.  Gallbladder wall calcifications (porcelain gallbladder).  Degenerative changes of the visualized thoracolumbar spine with mild lumbar levoscoliosis.  IMPRESSION: No evidence of bowel obstruction.  Gallbladder wall calcifications (porcelain gallbladder).   Electronically Signed   By: Charline Bills M.D.   On: 02/10/2013 08:02    Recent Labs  02/09/13 2300  WBC 9.8  HGB 7.7*  HCT 23.0*  PLT 90*    Recent Labs  02/09/13 2200 02/11/13 0500  NA 140 137  K 4.0 3.1*  CL 106 107  GLUCOSE 208* 108*  BUN 7 10  CREATININE 0.74 0.79  CALCIUM 8.5 7.6*   CBG (last 3)  No results found for this basename: GLUCAP,  in the last 72 hours  Wt Readings from Last 3 Encounters:  02/07/13 85.775 kg (189 lb 1.6 oz)  01/27/13 80.4 kg (177 lb 4 oz)  01/27/13 80.4 kg (177 lb 4 oz)    Physical Exam:  Constitutional: She is oriented to person, place, and time. She appears well-developed and well-nourished.  HENT:  Head: Normocephalic and atraumatic.  Eyes: EOM are normal.  Neck: Normal range of motion. Neck supple. No JVD present. No tracheal deviation present. No thyromegaly present.  Cardiovascular: Normal rate and regular rhythm. Small systolic murmur No murmur heard.  Pulmonary/Chest: Effort normal and breath sounds normal. No respiratory distress. She has no wheezes. She has no rales.  Abdominal: Soft.  Bowel sounds are normal. She exhibits no distension. There is no tenderness. There is no rebound.   no evidence of guarding.  Neurological: She is alert and oriented to person, place, and time. No cranial nerve deficit. Coordination normal.  Moved all 4's. No sensory loss distally. Strength 4- deltoid, biceps, triceps, 4/5 HI. LE 3/5 HF, KE 3+, Feet 4/5.  Skin: Skin is warm and dry.Psychiatric: She has a normal mood and affect. Her behavior is normal. Thought content normal Ext:  1+ R, 2+ left pretib edema  Assessment/Plan: 1. 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 Physiatrist is providing close team supervision and 24 hour management of active medical problems listed below. Physiatrist and rehab team continue to assess barriers to discharge/monitor patient progress toward functional and medical goals.  May hold pt for an additional day given problems over the weekend.---want to make sure she tolerates activities again, nausea is clear, and that stools are at a reasonable consistency/frequency for home. FIM: FIM - Bathing Bathing Steps Patient Completed: Chest;Right Arm;Left Arm;Abdomen;Right upper leg;Left upper leg Bathing: 3: Mod-Patient completes 5-7 55f 10 parts or 50-74%  FIM - Upper Body Dressing/Undressing Upper body dressing/undressing steps patient completed: Thread/unthread right sleeve of pullover shirt/dresss;Pull shirt over trunk;Put head through opening of pull over shirt/dress;Thread/unthread left sleeve of pullover shirt/dress Upper body dressing/undressing: 5: Set-up assist to: Obtain clothing/put away FIM -  Lower Body Dressing/Undressing Lower body dressing/undressing steps patient completed: Thread/unthread right underwear leg;Thread/unthread right pants leg;Fasten/unfasten pants Lower body dressing/undressing: 2: Max-Patient  completed 25-49% of tasks  FIM - Toileting Toileting steps completed by patient: Adjust clothing prior to toileting;Performs perineal hygiene;Adjust clothing after toileting Toileting Assistive Devices: Grab bar or rail for support Toileting: 0: Activity did not occur  FIM - Diplomatic Services operational officer Devices: Grab bars;Walker Event organiser: 0-Activity did not occur  FIM - Banker Devices: Therapist, occupational: 4: Bed > Chair or W/C: Min A (steadying Pt. > 75%);4: Chair or W/C > Bed: Min A (steadying Pt. > 75%)  FIM - Locomotion: Wheelchair Distance: 80' Locomotion: Wheelchair: 2: Travels 50 - 149 ft with supervision, cueing or coaxing FIM - Locomotion: Ambulation Locomotion: Ambulation Assistive Devices: Designer, industrial/product Ambulation/Gait Assistance: 5: Supervision Locomotion: Ambulation: 5: Travels 150 ft or more with supervision/safety issues  Comprehension Comprehension Mode: Auditory Comprehension: 6-Follows complex conversation/direction: With extra time/assistive device  Expression Expression Mode: Verbal Expression: 6-Expresses complex ideas: With extra time/assistive device  Social Interaction Social Interaction: 6-Interacts appropriately with others with medication or extra time (anti-anxiety, antidepressant).  Problem Solving Problem Solving: 6-Solves complex problems: With extra time  Memory Memory: 6-More than reasonable amt of time Medical Problem List and Plan:  1. 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 DVT Prophylaxis/Anticoagulation: Recent findings of LLE DVT 01/14/2013. Coumadin discontinued suspect GI bleed with IVC filter placed 01/26/2013.   -will need to deal with edema symptomatically 3. Pain Management: Tylenol as needed  4. Mood: Xanax as needed, Wellbutrin 150 mg daily. Providing emotional support   -prn trazodone to help with sleep 5. Neuropsych: This patient is capable of  making decisions on his own behalf.  6. Myasthenia gravis. Continue CellCept as well as Valcyte. 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 Continue Valcyte 900 mg twice daily initiated 01/29/2013 x3 weeks then reduce to 900 mg daily   -continue to work on bowel pattern/stool consistency, imodium, probiotic  -replacing potassium---will need a bigger home dose until diarrhea is completely resolved. 8. Chronic anemia/GI bleed. Patient has been transfused. Monitor hemoglobin as well as platelet counts  9. Hypothyroidism. Synthroid  10. PAF. Continue Lanoxin. Cardiac rate controlled.  11. Diastolic congestive heart failure. Monitor for any signs of fluid overload. 12. Anemia: hgb 7.7 and holding steady hx of likely GI bleed on coumadin before  -serial cbc's 13. Wound care---   -wounds are superficial. Continue current care  -wound care consult appreciate  14.  Lower ext edema L>R DVT , IVC filter 15. Nausea- better yesterday. Taking the imodium again to help with loose stools. Only taking at night.      LOS (Days) 7 A FACE TO FACE EVALUATION WAS PERFORMED  Addalynn Kumari T 02/11/2013 8:16 AM

## 2013-02-11 NOTE — Discharge Summary (Signed)
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  HISTORY OF PRESENT ILLNESS:  This is a 79 year old right-handed female with recent hospitalization for colitis and colonoscopy showing colon ulcers as well as recently diagnosed DVT, left common femoral vein, left popliteal vein, posterior tibial and left peroneal vein, on Coumadin therapy as well as history of myasthenia gravis, on CellCept and prednisone therapy.  Admitted January 24, 2013 with Hemoccult-positive stools.  Hemoglobin of 7.  She complained of generalized weakness and being lightheaded.  She denied any nausea or vomiting.  The patient also with recent UTI, completing antibiotic therapy.  INR on admission of 2.69.  Underwent flexible sigmoidoscopy January 26, 2013 per Dr. Leone Payor with findings of ulcers/colitis in the sigmoid colon and descending colon.  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  She was placed on Valcyte January 29, 2013 for CMV per Infectious Disease Dr. Cliffton Asters twice daily x3 weeks, then decreased to 900 mg daily. The patient on Coumadin prior to admission, discontinued, suspect GI bleed with IVC filter placed January 26, 2013.  She had been transfused 2 units of packed red blood  cells.  She remained on CellCept for myasthenia gravis.  She had been recently placed on prednisone for myasthenia gravis.  This was discontinued to try to reduce her immunosuppression.  Physical and occupational therapy ongoing.  The patient was admitted for a comprehensive rehab program.  PAST MEDICAL HISTORY:  See discharge diagnoses.  SOCIAL HISTORY:  The patient lives with family.  FUNCTIONAL HISTORY:  Prior to admission independent, sedentary. Functional status upon admission to rehab services +2 total assist with a rolling walker to ambulate 70 feet.  PHYSICAL EXAMINATION:  VITAL SIGNS:  Blood pressure 126/47, pulse 65, temperature 97.3, respirations 18. GENERAL:  This is an alert female, oriented x3. EYES:  Pupils were round and reactive to light. LUNGS:  Clear to auscultation. CARDIAC:  Regular rate and rhythm. ABDOMEN:  Soft, nontender.  Good bowel sounds. EXTREMITIES:  She moves all extremities.  She did have a sacral breakdown approximately 3 x 2 inches and second 2 x 2, both shallow with small amount of fiber necrotic tissue at the surface.  REHABILITATION HOSPITAL COURSE:  The patient was admitted to inpatient rehab services with therapies initiated on a 3-hour daily basis consisting of physical therapy, occupational therapy, and rehabilitation nursing.  The following issues were addressed during the patient's rehabilitation stay.  Pertaining to Ms. 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 Cliffton Asters.  She remained on Xanax and Wellbutrin for history of anxiety with depression with emotional support provided.  CellCept ongoing for history of myasthenia gravis.  Prednisone had been discontinued to try to reduce her immunosuppression.  Chronic anemia stable, no bleeding episodes.  She remained on hormone supplement for hypothyroidism.   She exhibited no signs of fluid overload.  She did have a sacral decubitus wound looking much better followed by wound care nurse.  Area was superficial with routine skin care as advised.  She would have a home health nurse for ongoing monitoring and skin care.  Currently she was receiving silver hydrofiber cut to fit and placed in the open wounds of the sacrum and buttocks. Aquacel covered with sacral foam dressing, changed every 3 days.  The patient received weekly collaborative interdisciplinary team conferences to discuss estimated length of stay, family teaching, and any barriers to discharge.  Her functional status continued to improve.  She was encouraged with her overall mobility. She was minimal assist for functional mobility in transfers, ambulating stairs, supervision minimal assist for basic activities of daily living. Ongoing family teaching was completed.  Plan was to be discharged to home with ongoing therapies dictated per rehab services.  DISCHARGE MEDICATIONS:  At the time of dictation included; 1. Xanax 0.25 mg p.o. b.i.d. p.r.n. as needed for anxiety. 2. Wellbutrin 150 mg p.o. daily. 3. Lanoxin 0.125 mg p.o. daily. 4. Synthroid 125 mcg p.o. daily. 5. Multivitamin 1 tab a daily. 6. CellCept 1000 mg p.o. b.i.d. 7. Protonix 40 mg p.o. b.i.d. 8. Valcyte 900 mg p.o. b.i.d. for total of 3 weeks, then decreased to     900 mg daily and follow up per Infectious Disease, Dr. Cliffton Asters.  DIET:  Dysphagia 3 thin liquids.  FOLLOWUP:  She would follow up Dr. Faith Rogue at the outpatient rehab service office as needed, Dr. Stan Head, Gastroenterology as needed, Dr. Gershon Crane, medical management, Dr. Cliffton Asters, Infectious Disease, call for appointment.  Wound care was dictated with home health nurse to follow.     Mariam Dollar, P.A.   ______________________________ Ranelle Oyster, M.D.    DA/MEDQ  D:  02/10/2013  T:  02/11/2013  Job:   [id number]  cc:   Ranelle Oyster, M.D. Levert Feinstein, MD Cliffton Asters, M.D. Iva Boop, MD,FACG Tera Mater Clent Ridges, MD

## 2013-02-11 NOTE — Progress Notes (Signed)
Physical Therapy Discharge Summary  Patient Details  Name: [name] MRN: [id number] Date of Birth: 18-Feb-1925  Today's Date: 02/12/2013 Time: 1478-2956 Time Calculation (min): 60 min  Session focused on family education for pt's daughter and son-in-law who are to be primary caregivers. Both practiced providing supervision for pt during ambulation x 150' with RW, 10 steps, car transfer x 2. PT demonstrated wheelchair breakdown and how to adjust leg rests on wheelchair. Cues from therapist for caregiver positioning and overall safety tips for avoiding falls. Reiterated importance of pt needing supervision when ambulating in the home pt and caregivers verbalized understanding. Discussed importance of pressure relief, frequency, and demonstrated pressure relief techniques.   Daughter provided supervision for pt to go to public bathroom x 2 due to loose bowels.   Patient has met 8 of 10 long term goals due to improved activity tolerance, improved balance, increased strength and ability to compensate for deficits.  Patient to discharge at an ambulatory level Supervision.   Patient's care partner is independent to provide the necessary physical assistance at discharge.  Reasons goals not met: Pt did not reach modified independence level for ambulation as she still benefits from supervision due to generalized weakness (which took a set-back over weekend secondary to sickness). Family is able to provide supervision needed at home.   Recommendation:  Patient will benefit from ongoing skilled PT services in home health setting to continue to advance safe functional mobility, address ongoing impairments in endurance, dynamic balance, safety, encourage independence, and minimize fall risk.  Equipment: RW recommended  Reasons for discharge: D/C from hospital  Patient/family agrees with progress made and goals achieved: Yes  PT Discharge Precautions/Restrictions  Fall Pain Pain  Assessment Pain Assessment: No/denies pain Cognition Orientation Level: Oriented X4 Sensation Sensation Light Touch: Appears Intact (does state diminished sensation in LLE) Stereognosis: Not tested Hot/Cold: Appears Intact Proprioception: Appears Intact Coordination Gross Motor Movements are Fluid and Coordinated: Yes Motor  Motor Motor: Abnormal postural alignment and control Motor - Skilled Clinical Observations: Pt continues with forward flexed posture during upright mobility.   Mobility Bed Mobility Bed Mobility: Supine to Sit;Sit to Supine Supine to Sit: HOB flat;6: Modified independent (Device/Increase time) Sit to Supine: 6: Modified independent (Device/Increase time);HOB flat Sit to Supine - Details (indicate cue type and reason): pt uses leg lifter for Lt. LE Transfers Transfers: Yes Sit to Stand: From bed;From chair/3-in-1;With upper extremity assist;6: Modified independent (Device/Increase time) Stand to Sit: To chair/3-in-1;With armrests;With upper extremity assist;To bed;6: Modified independent (Device/Increase time) Locomotion  Ambulation Ambulation: Yes Ambulation/Gait Assistance: 5: Supervision Ambulation Distance (Feet): 150 Feet Assistive device: Rolling walker Ambulation/Gait Assistance Details: Supervision for safety due to pt c/o weakness due to sickness over weekend, no overt losses of balance.  Gait Gait: Yes Gait Pattern: Impaired Gait Pattern: Step-through pattern (slight adduction of Rt, decreased foot clearance) Stairs / Additional Locomotion Stairs: Yes Stairs Assistance: 5: Supervision Stair Management Technique: Two rails;Step to pattern;Forwards Number of Stairs: Teacher, English as a foreign language: Yes Wheelchair Assistance: 6: Modified independent (Device/Increase time) Occupational hygienist: Both upper extremities Distance: 150'   Balance Balance Balance Assessed: Yes Cytogeneticist Standing - Balance  Support: Bilateral upper extremity supported Static Standing - Level of Assistance: 6: Modified independent (Device/Increase time) Dynamic Standing Balance Dynamic Standing - Balance Support: No upper extremity supported Dynamic Standing - Level of Assistance: 5: Stand by assistance Dynamic Standing - Balance Activities: Lateral lean/weight shifting;Forward lean/weight shifting;Reaching for objects Extremity Assessment  RLE Assessment RLE Assessment: Exceptions to [facility] RLE Strength RLE Overall Strength: Deficits RLE Overall Strength Comments: Functional weakness continues but grossly > 3+/5 LLE Assessment LLE Assessment: Exceptions to [facility] LLE Strength LLE Overall Strength: Deficits LLE Overall Strength Comments: Weaker than Rt. functionally but grossly >3/5  See FIM for current functional status  Wilhemina Bonito 02/12/2013, 12:10 PM

## 2013-02-11 NOTE — Progress Notes (Signed)
Physical Therapy Session Note  Patient Details  Name: [name] MRN: [id number] Date of Birth: Jan 19, 1925  Today's Date: 02/11/2013 Time: 4098-1191 Time Calculation (min): 50 min  Short Term Goals: Week 1:  PT Short Term Goal 1 (Week 1): =LTG's due to anticipated short LOS.   Skilled Therapeutic Interventions/Progress Updates:    Session focused on family ed, daughter and family friend "[name]" present. Both practiced providing supervision for pt during ambulation x 100' with rollator walker, 5 steps, car transfer x 2, and for static balance exercises in corner. Cues from therapist for caregiver positioning and overall safety tips for avoiding falls. Reiterated importance of need for supervision when ambulating in the home pt and caregivers verbalized understanding. Pt's other daughter and son-in-law are to come in for family education tomorrow morning for D/C. Discussed importance of pressure relief.   Therapy Documentation Precautions:  Precautions Precautions: Fall Precaution Comments: generalized weakness, swelling in BLEs, old TIA which she atributes LLE weakness to Restrictions Weight Bearing Restrictions: No Pain: Pain Assessment Pain Assessment: No/denies pain  See FIM for current functional status  Therapy/Group: Individual Therapy  Wilhemina Bonito 02/11/2013, 11:53 AM

## 2013-02-12 ENCOUNTER — Encounter (HOSPITAL_COMMUNITY): Payer: Medicare Other

## 2013-02-12 ENCOUNTER — Ambulatory Visit (HOSPITAL_COMMUNITY): Payer: Medicare Other | Admitting: Physical Therapy

## 2013-02-12 LAB — BASIC METABOLIC PANEL
BUN: 6 mg/dL (ref 6–23)
CO2: 22 mEq/L (ref 19–32)
Chloride: 107 mEq/L (ref 96–112)
Creatinine, Ser: 0.73 mg/dL (ref 0.50–1.10)
GFR calc Af Amer: 87 mL/min — ABNORMAL LOW (ref >90)
Glucose, Bld: 97 mg/dL (ref 70–99)
Potassium: 3.3 mEq/L — ABNORMAL LOW (ref 3.5–5.1)
Sodium: 138 mEq/L (ref 135–145)

## 2013-02-12 MED ORDER — ATORVASTATIN CALCIUM 20 MG PO TABS: 20.0000 mg | ORAL_TABLET | Freq: Every day | ORAL | Status: AC

## 2013-02-12 MED ORDER — LOPERAMIDE HCL 2 MG PO CAPS: 2.0000 mg | ORAL_CAPSULE | Freq: Three times a day (TID) | ORAL | Status: DC

## 2013-02-12 MED ORDER — CO-ENZYME Q-10 50 MG PO CAPS: 50.0000 mg | ORAL_CAPSULE | Freq: Every day | ORAL | Status: DC

## 2013-02-12 MED ORDER — BUPROPION HCL ER (XL) 150 MG PO TB24: 150.0000 mg | ORAL_TABLET | ORAL | Status: DC

## 2013-02-12 MED ORDER — PANTOPRAZOLE SODIUM 40 MG PO TBEC
40.0000 mg | DELAYED_RELEASE_TABLET | Freq: Every day | ORAL | Status: DC
Start: 2013-02-12 — End: 2013-02-12

## 2013-02-12 MED ORDER — POTASSIUM CHLORIDE ER 10 MEQ PO TBCR: 20.0000 meq | EXTENDED_RELEASE_TABLET | Freq: Two times a day (BID) | ORAL | Status: DC

## 2013-02-12 MED ORDER — LEVOTHYROXINE SODIUM 125 MCG PO TABS: 125.0000 ug | ORAL_TABLET | Freq: Every day | ORAL | Status: DC

## 2013-02-12 MED ORDER — VALGANCICLOVIR HCL 450 MG PO TABS: ORAL_TABLET | ORAL | Status: DC

## 2013-02-12 MED ORDER — ALPRAZOLAM 0.25 MG PO TABS: 0.2500 mg | ORAL_TABLET | Freq: Two times a day (BID) | ORAL | Status: DC | PRN

## 2013-02-12 MED ORDER — MYCOPHENOLATE MOFETIL 500 MG PO TABS
1000.0000 mg | ORAL_TABLET | Freq: Two times a day (BID) | ORAL | Status: AC
Start: 2013-02-12 — End: ?

## 2013-02-12 MED ORDER — HEPARIN SOD (PORK) LOCK FLUSH 100 UNIT/ML IV SOLN
500.0000 [IU] | INTRAVENOUS | Status: AC | PRN
  Administered 2013-02-12: 500 [IU]

## 2013-02-12 MED ORDER — DIGOXIN 125 MCG PO TABS: 0.1250 mg | ORAL_TABLET | Freq: Every day | ORAL | Status: DC

## 2013-02-12 MED ORDER — OMEGA-3 FATTY ACIDS 1000 MG PO CAPS: 1.0000 g | ORAL_CAPSULE | Freq: Every day | ORAL | Status: DC

## 2013-02-12 MED ORDER — PANTOPRAZOLE SODIUM 40 MG PO TBEC: 40.0000 mg | DELAYED_RELEASE_TABLET | Freq: Every day | ORAL | Status: DC

## 2013-02-12 NOTE — Progress Notes (Signed)
Subjective/Complaints:  stools improving as a whole. More manageable but remain loose A 12 point review of systems has been performed and if not noted above is otherwise negative.   Objective: Vital Signs: Blood pressure 118/54, pulse 66, temperature 97.7 F (36.5 C), temperature source Oral, resp. rate 17, height 5\' 6"  (1.676 m), weight 85.775 kg (189 lb 1.6 oz), SpO2 99.00%. No results found.  Recent Labs  02/09/13 2300  WBC 9.8  HGB 7.7*  HCT 23.0*  PLT 90*    Recent Labs  02/11/13 0500 02/12/13 0500  NA 137 138  K 3.1* 3.3*  CL 107 107  GLUCOSE 108* 97  BUN 10 6  CREATININE 0.79 0.73  CALCIUM 7.6* 7.8*   CBG (last 3)  No results found for this basename: GLUCAP,  in the last 72 hours  Wt Readings from Last 3 Encounters:  02/07/13 85.775 kg (189 lb 1.6 oz)  01/27/13 80.4 kg (177 lb 4 oz)  01/27/13 80.4 kg (177 lb 4 oz)    Physical Exam:  Constitutional: She is oriented to person, place, and time. She appears well-developed and well-nourished.  HENT:  Head: Normocephalic and atraumatic.  Eyes: EOM are normal.  Neck: Normal range of motion. Neck supple. No JVD present. No tracheal deviation present. No thyromegaly present.  Cardiovascular: Normal rate and regular rhythm. Small systolic murmur No murmur heard.  Pulmonary/Chest: Effort normal and breath sounds normal. No respiratory distress. She has no wheezes. She has no rales.  Abdominal: Soft. Bowel sounds are normal. She exhibits no distension. There is no tenderness. There is no rebound.   no evidence of guarding.  Neurological: She is alert and oriented to person, place, and time. No cranial nerve deficit. Coordination normal.  Moved all 4's. No sensory loss distally. Strength 4- deltoid, biceps, triceps, 4/5 HI. LE 3/5 HF, KE 3+, Feet 4/5.  Skin: wounds very superficial at this point .Psychiatric: She has a normal mood and affect. Her behavior is normal. Thought content normal Ext:  1+ R, 2+ left pretib  edema  Assessment/Plan: 1. 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 Physiatrist is providing close team supervision and 24 hour management of active medical problems listed below. Physiatrist and rehab team continue to assess barriers to discharge/monitor patient progress toward functional and medical goals.  From my standpoint she can go today. She will have to deal with some of this loose stool once home. Needs to folllow up with GI of course after dc. Family ed today before dc.   FIM: FIM - Bathing Bathing Steps Patient Completed: Chest;Right Arm;Left Arm;Abdomen;Front perineal area Bathing: 3: Mod-Patient completes 5-7 14f 10 parts or 50-74%  FIM - Upper Body Dressing/Undressing Upper body dressing/undressing steps patient completed: Thread/unthread left sleeve of pullover shirt/dress;Put head through opening of pull over shirt/dress;Thread/unthread right sleeve of pullover shirt/dresss;Pull shirt over trunk Upper body dressing/undressing: 5: Set-up assist to: Obtain clothing/put away FIM - Lower Body Dressing/Undressing Lower body dressing/undressing steps patient completed: Thread/unthread right underwear leg;Pull underwear up/down;Thread/unthread left pants leg;Thread/unthread right pants leg;Thread/unthread left underwear leg;Pull pants up/down;Don/Doff right sock;Don/Doff left sock;Fasten/unfasten right shoe;Fasten/unfasten left shoe Lower body dressing/undressing: 4: Min-Patient completed 75 plus % of tasks  FIM - Toileting Toileting steps completed by patient: Adjust clothing prior to toileting;Performs perineal hygiene;Adjust clothing after toileting Toileting Assistive Devices: Grab bar or rail for support Toileting: 5: Supervision: Safety issues/verbal cues  FIM - Diplomatic Services operational officer Devices: Grab  bars;Walker Event organiser: 0-Activity did not occur  FIM - Banker Devices: Therapist, occupational: 4: Bed > Chair or W/C: Min A (steadying Pt. > 75%);4: Chair or W/C > Bed: Min A (steadying Pt. > 75%)  FIM - Locomotion: Wheelchair Distance: 80' Locomotion: Wheelchair: 2: Travels 50 - 149 ft with supervision, cueing or coaxing FIM - Locomotion: Ambulation Locomotion: Ambulation Assistive Devices: Designer, industrial/product Ambulation/Gait Assistance: 5: Supervision Locomotion: Ambulation: 2: Travels 50 - 149 ft with supervision/safety issues  Comprehension Comprehension Mode: Auditory Comprehension: 6-Follows complex conversation/direction: With extra time/assistive device  Expression Expression Mode: Verbal Expression: 6-Expresses complex ideas: With extra time/assistive device  Social Interaction Social Interaction: 6-Interacts appropriately with others with medication or extra time (anti-anxiety, antidepressant).  Problem Solving Problem Solving: 5-Solves basic problems: With no assist  Memory Memory: 6-More than reasonable amt of time Medical Problem List and Plan:  1. 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 DVT Prophylaxis/Anticoagulation: Recent findings of LLE DVT 01/14/2013. Coumadin discontinued suspect GI bleed with IVC filter placed 01/26/2013.   -will need to deal with edema symptomatically going forward 3. Pain Management: Tylenol as needed  4. Mood: Xanax as needed, Wellbutrin 150 mg daily. Providing emotional support   -prn trazodone to help with sleep 5. Neuropsych: This patient is capable of making decisions on his own behalf.  6. Myasthenia gravis. Continue CellCept as well as Valcyte. 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 Continue Valcyte 900 mg twice daily initiated 01/29/2013 x3 weeks then reduce to 900 mg daily   -continue to work on  bowel pattern/stool consistency, imodium, probiotic  -replacing potassium---continue supplementation   -encouraged increased dietary intake as well  -check with GI before dc for any further recs, outpt follow up 8. Chronic anemia/GI bleed. Patient has been transfused. Monitor hemoglobin as well as platelet counts  9. Hypothyroidism. Synthroid  10. PAF. Continue Lanoxin. Cardiac rate controlled.  11. Diastolic congestive heart failure. Monitor for any signs of fluid overload. 12. Anemia: hgb 7.7 and holding steady hx of likely GI bleed on coumadin before  -serial cbc's 13. Wound care---   -wounds have improved nicely. Continue present plan0-----hh rn to follow up  14.  Lower ext edema L>R DVT , IVC filter 15. Nausea- better yesterday. Taking the imodium again to help with loose stools. Only taking at night.   -recommended taking it bid to better control stool.     LOS (Days) 8 A FACE TO FACE EVALUATION WAS PERFORMED  [name] T 02/12/2013 8:15 AM

## 2013-02-12 NOTE — Progress Notes (Signed)
Occupational Therapy Discharge Summary  Patient Details  Name: [name] MRN: [id number] Date of Birth: [birth year]  Today's Date: 02/12/2013   Patient has met 7 of 8 long term goals due to improved activity tolerance, improved balance, postural control and functional use of  LEFT upper and LEFT lower extremity.  Patient to discharge at overall Modified Independent level.  Patient's care partner is independent to provide the necessary physical assistance at discharge.    Pt made improved progression throughout week of therapy. Pt made improvements in dressing tasks,and transfers to and from toilet, shower , and to and from bed to w/c or RW. Pt demonstrated improvement in mobility in LLE which allowed gains in dressing goals, standing balance, and activity tolerance. Pt benefits from use of step stool for LLE when pt become fatigued or unsuccessful of lifting leg. Pt currently uses long handled sponge to assist with washing feet. Pt issued leg lifter to assist with LLE when going from sit to supine in bed. Pt mostly Mod I for goals with exception of lower body dressing of supervision.  Recommendation:  No f/u at this time  Equipment: [facility]  Reasons for discharge: treatment goals met and discharge from hospital  Patient/family agrees with progress made and goals achieved: Yes  OT Discharge  ADL ADL Equipment Provided: Long-handled sponge;Leg lifter Grooming: Independent Upper Body Bathing: Modified independent Where Assessed-Upper Body Bathing: Sitting at sink Lower Body Bathing: Modified independent Where Assessed-Lower Body Bathing: Sitting at sink Upper Body Dressing: Independent Where Assessed-Upper Body Dressing: Standing at sink Lower Body Dressing: Supervision/safety Where Assessed-Lower Body Dressing: Sitting at sink Toileting: Modified independent Where Assessed-Toileting: Teacher, adult education: Engineer, agricultural Method: Network engineer: Engineer, technical sales: Unable to assess Tub/Shower Transfer Method: Unable to assess [employer] Transfer: Distant supervision Film/video editor Method: Ambulating (ambulating with rolling walker) Office manager with back Vision/Perception  Vision - History Baseline Vision: Wears glasses all the time Patient Visual Report: No change from baseline Vision - Assessment Eye Alignment: Within Functional Limits Perception Perception: Within Functional Limits Praxis Praxis: Intact  Cognition Overall Cognitive Status: Within Functional Limits for tasks assessed Orientation Level: Oriented X4 Sensation Sensation Light Touch: Appears Intact Stereognosis: Not tested Hot/Cold: Appears Intact Proprioception: Appears Intact Coordination Gross Motor Movements are Fluid and Coordinated: Yes Fine Motor Movements are Fluid and Coordinated: Yes Motor  Motor Motor: Within Functional Limits Motor - Skilled Clinical Observations: Pt continues with forward flexed posture during upright mobility.   Trunk/Postural Assessment  Cervical Assessment Cervical Assessment: Within Functional Limits Thoracic Assessment Thoracic Assessment: Within Functional Limits Lumbar Assessment Lumbar Assessment: Within Functional Limits Postural Control Postural Control: Deficits on evaluation Postural Limitations: pt stands with flexed posture during standing tasks  Balance Balance Balance Assessed: Yes Static Sitting Balance Static Sitting - Balance Support: No upper extremity supported;Feet supported Static Sitting - Level of Assistance: 7: Independent Static Standing Balance Static Standing - Balance Support: No upper extremity supported Static Standing - Level of Assistance: 6: Modified independent (Device/Increase time) Dynamic Standing Balance Dynamic Standing - Balance Support: No upper extremity supported Dynamic Standing - Level of Assistance: 5:  Stand by assistance Dynamic Standing - Balance Activities: Forward lean/weight shifting;Lateral lean/weight shifting;Reaching for objects Extremity/Trunk Assessment RUE Assessment RUE Assessment: Within Functional Limits RUE AROM (degrees) Overall AROM Right Upper Extremity: Within functional limits for tasks performed RUE Strength RUE Overall Strength: Within Functional Limits for tasks performed RUE Tone RUE Tone: Within Functional Limits  LUE Assessment LUE Assessment: Within Functional Limits LUE AROM (degrees) Overall AROM Left Upper Extremity: Within functional limits for tasks assessed LUE Strength LUE Overall Strength: Within Functional Limits for tasks assessed LUE Tone LUE Tone: Within Functional Limits   Olivia Mackie 02/12/2013, 1:52 PM

## 2013-02-12 NOTE — Progress Notes (Signed)
Pt discharged home with family. Discharge instructions provided by Harvel Ricks, PA. Pt verbalized understanding.All questions answered. Pt escorted off unit in w/c with personal belonging by [place], [place].

## 2013-02-12 NOTE — Progress Notes (Signed)
Note reviewed and accurately reflects treatment session.   

## 2013-02-12 NOTE — Progress Notes (Signed)
Occupational Therapy Session Note  Patient Details  Name: [name] MRN: [id number] Date of Birth: 1924/12/13  Today's Date: 02/12/2013 Time: 1000-1045 Time Calculation (min): 45 min  Short Term Goals: Week 1:  OT Short Term Goal 1 (Week 1): STG=LTG  Skilled Therapeutic Interventions/Progress Updates:    pt resting in bed upon arrival. Pt went from supine to sit with Mod independence. Pt went from bed to standing with RW with Mod I. Pt ambulated with RW from bed to w/c placed in front of sink with supervision. Pt engaged in bathing tasks using water basin in sink. Pt completed upper and lower body bathing with Mod I using long handled sponge to wash feet. Pt completes upper body dressing with complete independence threading a pull over shirt, and completes lower body dressing with supervision, standing from w/c to pull underwear and pants over hips. Pt is able to don socks, and can don shoes and fasten them, but pt declined putting them on stating "the shoes are to heavy and makes it harder to walk". Pt completes 4/4 grooming tasks with independence. Pt daughter and son and law present in whom she is currently living with. Educated both pt and family on use of AE such long handled sponge, explained the role of OT and what we addressed and how her mother has progressed. Answered all questions appropriate for OT and informed to talk to nursing and doctor for questions concerning bed sore and discharge. Left pt in room with family sitting in w/c with all items in reach.   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

## 2013-02-14 ENCOUNTER — Telehealth: Payer: Self-pay

## 2013-02-14 NOTE — Telephone Encounter (Signed)
Joanne(RN from [facility] called to see if it was okay to draw patient's labs and orders  Tomorrow 02/15/13. She was unaware the patient had a port a cath and didn't have the supplies with her.

## 2013-02-14 NOTE — Telephone Encounter (Signed)
Joanne(RN) from [facility] called to see if it was okay to draw patient's labs tomorrow 02/15/13. She was unaware the patient had a p

## 2013-02-15 ENCOUNTER — Telehealth: Payer: Self-pay

## 2013-02-15 NOTE — Telephone Encounter (Signed)
Darl Pikes with [facility] called to let us know she was unsuccessful with drawing blood for patient.  She did try venipuncture and still failed.  Darl Pikes will contact her boss to have someone else go out and try to draw blood.  Verbal ok given.

## 2013-02-15 NOTE — Telephone Encounter (Signed)
That would be fine -- thanks  

## 2013-02-18 ENCOUNTER — Ambulatory Visit: Payer: Medicare Other | Admitting: Family Medicine

## 2013-02-19 ENCOUNTER — Telehealth: Payer: Self-pay | Admitting: Family Medicine

## 2013-02-19 ENCOUNTER — Inpatient Hospital Stay: Payer: Medicare Other | Admitting: Infectious Disease

## 2013-02-19 NOTE — Telephone Encounter (Signed)
Please advise if patient should go back on blood thinners

## 2013-02-19 NOTE — Telephone Encounter (Signed)
Patient Information:  Caller Name: Carley Hammed  Phone: [phone]  Patient: [name], [name]  Gender: Female  DOB: 1924/08/11  Age: 79 Years  PCP: Gershon Crane [facility])  Office Follow Up:  Does the office need to follow up with this patient?: Yes  Instructions For The Office: Please contact. Questions about Plavix/blood thinner  RN Note:  Concern about patient not on a blood thinner for x1 month . History of DVT and mini stroke.  Issues with blood thinners and frequent hospitalization.  Daughter is concerned , requesting follow up.  Please contact  Symptoms  Reason For Call & Symptoms: Daughter EVA is calling about her mothers Plavix. She has been on the medication 2008 by Neurologist . She was in the hospital in July, 2014 for Diarrhea and deydration and devleloped  DVT in her left leg. She has been hospitalized x 4 since June.  She has been on Plavix, coumadin and Lovenox during these episodes. .  She experienced bleeding and now they have stopped all blood thinners. She has been without blood thinners for almost a month. Daugher is concerned about no blood thinners and DVT. She has had a filter placed..  Daughter would like follow up ...  Reviewed Health History In EMR: Yes  Reviewed Medications In EMR: Yes  Reviewed Allergies In EMR: Yes  Reviewed Surgeries / Procedures: Yes  Date of Onset of Symptoms: 02/19/2013  Guideline(s) Used:  No Protocol Available - Information Only  Disposition Per Guideline:   Discuss with PCP and Callback by Nurse Today  Reason For Disposition Reached:   Nursing judgment  Advice Given:  Call Back If:  New symptoms develop  You become worse.  Patient Will Follow Care Advice:  YES

## 2013-02-19 NOTE — Telephone Encounter (Signed)
Per Dr Clent Ridges,  Patient okay to wait on appointment.  Orders for CBC, PT/PTT should be ordered from [facility].  Daughter is aware.

## 2013-02-19 NOTE — Telephone Encounter (Signed)
Spoke with daughter but unable to bring patient in until 02/26/13 because the patient is not ambulatory yet.  There is a nurse from [facility] to check on the patient.  Labs have been drawn but no results have been given.

## 2013-02-20 ENCOUNTER — Other Ambulatory Visit: Payer: Self-pay | Admitting: Family Medicine

## 2013-02-20 DIAGNOSIS — I82409 Acute embolism and thrombosis of unspecified deep veins of unspecified lower extremity: Secondary | ICD-10-CM

## 2013-02-20 NOTE — Telephone Encounter (Signed)
Orders placed and verified by [facility]

## 2013-02-21 ENCOUNTER — Telehealth: Payer: Self-pay

## 2013-02-21 ENCOUNTER — Telehealth: Payer: Self-pay | Admitting: Family Medicine

## 2013-02-21 NOTE — Telephone Encounter (Signed)
RN states that the pt is experiencing severe swelling in her lower extremities. She is not on any type of diuretic, and she states that maybe one should be considered. I made the nurse aware that the pt does have an appointment for next Tuesday, and she stated that would be fine. Thank you!

## 2013-02-21 NOTE — Telephone Encounter (Signed)
Left message for patient to call office regarding her lab results.  Dr Riley Kill is concerned about potassium level and would like to know if patient is taking her potassium.

## 2013-02-22 ENCOUNTER — Other Ambulatory Visit: Payer: Self-pay | Admitting: Family Medicine

## 2013-02-22 MED ORDER — FUROSEMIDE 20 MG PO TABS: 20.0000 mg | ORAL_TABLET | Freq: Every day | ORAL | Status: DC

## 2013-02-22 MED ORDER — TORSEMIDE 10 MG PO TABS: 10.0000 mg | ORAL_TABLET | Freq: Two times a day (BID) | ORAL | Status: DC

## 2013-02-22 NOTE — Telephone Encounter (Signed)
Patient is taking potassium qid. Patient follows up with Dr Eunice Blase next week.  Patients daughter is aware of results and is going follow up with Dr Eunice Blase.

## 2013-02-22 NOTE — Telephone Encounter (Signed)
Since she is allergic to Lasix, cancel this order. Instead call in Demadex 10 mg bid, #60 with 5 rf

## 2013-02-22 NOTE — Telephone Encounter (Signed)
I sent script e-scribe and spoke with daughter. Also per Dr. Clent Ridges increase potassium to 60 meq every day.

## 2013-02-22 NOTE — Telephone Encounter (Signed)
Call in Lasix 20 mg daily, one year supply

## 2013-02-26 ENCOUNTER — Encounter: Payer: Self-pay | Admitting: Family Medicine

## 2013-02-26 ENCOUNTER — Ambulatory Visit: Payer: Medicare Other | Admitting: Family Medicine

## 2013-02-26 ENCOUNTER — Ambulatory Visit (INDEPENDENT_AMBULATORY_CARE_PROVIDER_SITE_OTHER): Payer: Medicare Other | Admitting: Family Medicine

## 2013-02-26 VITALS — BP 114/60 | HR 81 | Temp 98.2°F | Wt 176.0 lb

## 2013-02-26 DIAGNOSIS — I82409 Acute embolism and thrombosis of unspecified deep veins of unspecified lower extremity: Secondary | ICD-10-CM

## 2013-02-26 DIAGNOSIS — I82402 Acute embolism and thrombosis of unspecified deep veins of left lower extremity: Secondary | ICD-10-CM

## 2013-02-26 DIAGNOSIS — E876 Hypokalemia: Secondary | ICD-10-CM

## 2013-02-26 DIAGNOSIS — I4891 Unspecified atrial fibrillation: Secondary | ICD-10-CM

## 2013-02-26 DIAGNOSIS — Z23 Encounter for immunization: Secondary | ICD-10-CM

## 2013-02-26 DIAGNOSIS — E538 Deficiency of other specified B group vitamins: Secondary | ICD-10-CM

## 2013-02-26 DIAGNOSIS — G7000 Myasthenia gravis without (acute) exacerbation: Secondary | ICD-10-CM

## 2013-02-26 DIAGNOSIS — I10 Essential (primary) hypertension: Secondary | ICD-10-CM

## 2013-02-26 DIAGNOSIS — K633 Ulcer of intestine: Secondary | ICD-10-CM

## 2013-02-26 LAB — BASIC METABOLIC PANEL
BUN: 11 mg/dL (ref 6–23)
CO2: 29 mEq/L (ref 19–32)
Calcium: 8.3 mg/dL — ABNORMAL LOW (ref 8.4–10.5)
Chloride: 106 mEq/L (ref 96–112)
Creatinine, Ser: 0.9 mg/dL (ref 0.4–1.2)
Glucose, Bld: 94 mg/dL (ref 70–99)
Sodium: 142 mEq/L (ref 135–145)

## 2013-02-26 MED ORDER — IRBESARTAN-HYDROCHLOROTHIAZIDE 300-12.5 MG PO TABS: 1.0000 | ORAL_TABLET | Freq: Every day | ORAL | Status: DC

## 2013-02-26 MED ORDER — CYANOCOBALAMIN 1000 MCG/ML IJ SOLN
1000.0000 ug | Freq: Once | INTRAMUSCULAR | Status: AC
  Administered 2013-02-26: 1000 ug via INTRAMUSCULAR

## 2013-02-26 NOTE — Addendum Note (Signed)
Addended by: Aniceto Boss A on: 02/26/2013 01:05 PM   Modules accepted: Orders

## 2013-02-26 NOTE — Progress Notes (Signed)
  Subjective:    Patient ID: [name], female    DOB: 15-Jan-1925, 79 y.o.   MRN: [id number]  HPI Here with her daughter to follow up recent hospital stays. She was in [facility] from 01-24-13 to 02-04-13 for dehydration, diarrhea, and severe anemia. 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 Colonoscopy revealed numerous ulcerations in the colon. Infectious Disease was consulted and Dr. Cliffton Asters started her on Valcyte to combat this. She had been on chronic immunosuppression with prednisone and Cellcept for her myasthenia gravis, and this was felt to have contributed to her getting this infection. Prednisone was stopped but Cellcept was continued. The diarrhea has greatly improved, and now instead of numerous watery stools every day she is having 2 pasty stools a day. No fever or pain. Her last Hgb was 8.4 on 02-15-13. She had another CBC drawn by home health nurses several days ago but these results are not available yet. Her potassium was down to 3.2 on 02-15-13 and we increased her potassium supplement to a total of 60 mEq daily. She is now on Demadex for leg edema. She has been advancing her diet and her appetite has improved. The [facility] nurses are dressing a sacral decubitus wound 3 days a week and it is improving and getting smaller. Her BP has been stable. She has a left leg DVT but she could not take any anticoagulants because of the GI blood losses, so an IVC filter was placed. She has a Port-a-Cath in place in the right chest since her venous access in the arms was so poor.    Review of Systems  Constitutional: Negative.   Respiratory: Negative.   Cardiovascular: Negative.   Neurological: Negative.        Objective:   Physical Exam  Constitutional: She is oriented to person, place, and time. She appears well-developed and well-nourished. No distress.  Alert, good color   Cardiovascular: Normal rate, regular rhythm, normal heart sounds and intact distal pulses.   Pulmonary/Chest:  Effort normal and breath sounds normal.  Abdominal: Soft. Bowel sounds are normal. She exhibits no distension and no mass. There is no tenderness. There is no rebound and no guarding.  Musculoskeletal:  2+ edema in both lower legs   Neurological: She is alert and oriented to person, place, and time.          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 She will follow up with Dr. Orvan Falconer on 03-18-13. We will check a BMET today to follow the potassium level closely. We await the results of her recent CBC. She is back on Avalide for her BP, and this is stable. She remains off aspirin and Plavix for the time being, but once her colon heals I plan to resume the Plavix. This is for stroke prevention in the setting of paroxysmal atrial fibrillation. We will see her back in one month

## 2013-02-27 NOTE — Progress Notes (Signed)
Quick Note:  I spoke with pt and released results in my chart ______ 

## 2013-02-28 ENCOUNTER — Telehealth: Payer: Self-pay | Admitting: Family Medicine

## 2013-02-28 MED ORDER — IRBESARTAN-HYDROCHLOROTHIAZIDE 300-12.5 MG PO TABS: 1.0000 | ORAL_TABLET | Freq: Every day | ORAL | Status: DC

## 2013-02-28 NOTE — Telephone Encounter (Signed)
I sent script e-scribe and spoke with pt. 

## 2013-02-28 NOTE — Telephone Encounter (Signed)
Pt called and stated that [facility] never received her RX for irbesartan-hydrochlorothiazide (AVALIDE) 300-12.5 MG per tablet. Please assist.

## 2013-03-06 ENCOUNTER — Telehealth: Payer: Self-pay

## 2013-03-06 NOTE — Telephone Encounter (Signed)
Please have her continue Valcyte once daily until her followup visit with me on October 27. Also, please see if she can get patient assistance to help afford it.

## 2013-03-06 NOTE — Telephone Encounter (Signed)
Patient was taking Valcyte and diarrhea was improving.   Medication was decreased to one tablet daily on 02-20-2013    Last 5 days it has gotten worse. Having 2-3 liquid brown stools per day.  Daughter has been giving the imodium three times daily as directed.   Pt is concerned about increased liquid stools . Please advise.   If she is to continue this medication the family will need patient  Assistance. They paid $1800.00 for last fill.   [phone] until 1 (Daughter) Carley Hammed After 1 pm  call  Home number and leave a message .

## 2013-03-08 ENCOUNTER — Telehealth: Payer: Self-pay | Admitting: *Deleted

## 2013-03-08 NOTE — Telephone Encounter (Signed)
Shared Dr Blair Dolphin instructions about continuing to take only one Valcyte tablet daily with the pt's daughter.  Daughter verbalized understanding.  Provided daughter with the [employer] website information to apply for co-pay assistance for Valcyte.  Daughter verbalized understanding.  (panfoundation.[email or web address])

## 2013-03-18 ENCOUNTER — Ambulatory Visit (INDEPENDENT_AMBULATORY_CARE_PROVIDER_SITE_OTHER): Payer: Medicare Other | Admitting: Internal Medicine

## 2013-03-18 ENCOUNTER — Encounter: Payer: Self-pay | Admitting: Internal Medicine

## 2013-03-18 VITALS — BP 111/67 | HR 101 | Temp 97.7°F | Ht 64.0 in | Wt 159.8 lb

## 2013-03-18 DIAGNOSIS — A0839 Other viral enteritis: Secondary | ICD-10-CM

## 2013-03-18 DIAGNOSIS — B259 Cytomegaloviral disease, unspecified: Secondary | ICD-10-CM

## 2013-03-18 DIAGNOSIS — A09 Infectious gastroenteritis and colitis, unspecified: Secondary | ICD-10-CM

## 2013-03-18 NOTE — Progress Notes (Signed)
Patient ID: [name], female   DOB: 06/26/1924, 79 y.o.   MRN: [id number]         [facility]  Patient Active Problem List   Diagnosis Date Noted  . Unintentional weight loss 01/29/2013    Priority: High  . Nausea alone 01/14/2013    Priority: High  . Loss of appetite 01/14/2013    Priority: High  . 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 Myasthenia gravis without exacerbation 12/26/2007    Priority: Medium  . Physical deconditioning 02/04/2013  . Atrial fibrillation 01/30/2013  . Lower GI bleed 01/24/2013  . DVT of leg (deep venous thrombosis) 01/23/2013  . Malnutrition of moderate degree 12/10/2012  . Hypokalemia 12/07/2012  . Acute renal failure 12/07/2012  . VITAMIN B12 DEFICIENCY 02/22/2010  . ANXIETY 12/26/2007  . TIA 12/26/2007  . HYPOTHYROIDISM 11/10/2006  . HYPERLIPIDEMIA 11/10/2006  . ANEMIA-NOS 11/10/2006  . HYPERTENSION 11/10/2006    Patient's Medications  New Prescriptions   No medications on file  Previous Medications   ALPRAZOLAM (XANAX) 0.25 MG TABLET    Take 1 tablet (0.25 mg total) by mouth 2 (two) times daily as needed for anxiety.   ALPRAZOLAM (XANAX) 1 MG TABLET    Take 1 mg by mouth at bedtime as needed for sleep.   ATORVASTATIN (LIPITOR) 20 MG TABLET    Take 1 tablet (20 mg total) by mouth daily with supper.   BUPROPION (WELLBUTRIN XL) 150 MG 24 HR TABLET    Take 1 tablet (150 mg total) by mouth every morning.   CO-ENZYME Q-10 50 MG CAPSULE    Take 1 capsule (50 mg total) by mouth at bedtime.   CYANOCOBALAMIN (,VITAMIN B-12,) 1000 MCG/ML INJECTION    Inject 1,000 mcg into the muscle every 30 (thirty) days.   FEEDING SUPPLEMENT (ENSURE COMPLETE) LIQD    Take 237 mLs by mouth daily.   FISH OIL-OMEGA-3 FATTY ACIDS 1000 MG CAPSULE    Take 1 capsule (1 g total) by mouth daily.   GLUCOSAMINE-CHONDROITIN 500-400 MG TABLET    Take 1 tablet by mouth 2 (two) times daily.   IRBESARTAN-HYDROCHLOROTHIAZIDE (AVALIDE)  300-12.5 MG PER TABLET    Take 1 tablet by mouth daily.   LEVOTHYROXINE (SYNTHROID, LEVOTHROID) 125 MCG TABLET    Take 1 tablet (125 mcg total) by mouth daily.   LOPERAMIDE (IMODIUM) 2 MG CAPSULE    Take 1 capsule (2 mg total) by mouth 3 (three) times daily.   MYCOPHENOLATE (CELLCEPT) 500 MG TABLET    Take 2 tablets (1,000 mg total) by mouth 2 (two) times daily.   POTASSIUM CHLORIDE (K-DUR) 10 MEQ TABLET    Take 30 mEq by mouth 2 (two) times daily with a meal.   PROBIOTIC PRODUCT (PROBIOTIC DAILY PO)    Take by mouth.   TORSEMIDE (DEMADEX) 10 MG TABLET    Take 1 tablet (10 mg total) by mouth 2 (two) times daily.   VALGANCICLOVIR (VALCYTE) 450 MG TABLET    Continue twice daily until 02/19/2013 and then decrease to daily   VITAMIN C (ASCORBIC ACID) 250 MG TABLET    Take 250 mg by mouth daily.  Modified Medications   No medications on file  Discontinued Medications   No medications on file    Subjective: Ms. Marschner is in for hospital followup visit. She developed chronic diarrhea several months ago and underwent colonoscopy and biopsy which revealed cytomegalovirus inclusions. Her CMV viral load was also positive. She  has been on mycophenolate for many years for myasthenia gravis. I started her on valganciclovir 7 weeks ago and she has had significant improvement in her diarrhea. She is now back to her normal pattern of 3-4 bowel movements daily. She is not having any watery diarrhea. She describes her stools as semi-soft. She's not had any nausea, vomiting, abdominal pain or fever. She's had no further GI bleeding. She's not had any problems that she is aware of tolerating valganciclovir.  Review of Systems: Pertinent items are noted in HPI.  Past Medical History  Diagnosis Date  . Anemia   . Hyperlipemia   . Hypertension   . Thyroid disease     hypothyroidism  . Anxiety   . Paroxysmal atrial fibrillation   . Vitamin B 12 deficiency   . CHF (congestive heart failure)   . TIA (transient  ischemic attack)     4-09  . Microscopic hematuria     benign microscopic hematuria, worked up b Dr Alexis Frock  . Gall stone     porcelain gall bladder with single stone  per CT 02-2009 Dr Birdie Sons  . Osteoporosis     DEXA on 04-30-10  . Myasthenia gravis     sees Dr. Terrace Arabia   . Ulcerative colitis   . DVT of leg (deep venous thrombosis)     left leg     History  Substance Use Topics  . Smoking status: Never Smoker   . Smokeless tobacco: Never Used  . Alcohol Use: No    Family History  Problem Relation Age of Onset  . Heart disease Father     Allergies  Allergen Reactions  . Zofran [Ondansetron Hcl]     hypotension  . Furosemide Rash    REACTION: unspecified  . Ivp Dye [Iodinated Diagnostic Agents] Rash  . Sulfamethoxazole Rash    REACTION: unspecified    Objective: Temp: 97.7 F (36.5 C) (10/27 1359) Temp src: Oral (10/27 1359) BP: 111/67 mmHg (10/27 1359) Pulse Rate: 101 (10/27 1359)  General: She is in good spirits and in no distress. She ambulates with a walker. One of her daughters is with her today. She has lost about 25 pounds over the past several months. Skin: No rash Lungs: Clear Cor: Regular S1 and S2 no murmurs Port-A-Cath site: Normal Abdomen: Soft and nontender with normal bowel sounds Extremities: No edema  Lab Results 02/21/2013 WBC 2800   hemoglobin 8.6   platelets 228,000    Assessment: She's had clinical improvement with valganciclovir therapy. She has some leukopenia intermittently over the past few months. I have suggested that she stop valganciclovir therapy but she is fairly adamant that she wants to complete the final 2 weeks supply that she has at home since she had to pay about $1600 for it. I will have her home health nurse draw a repeat CBC early next week and see her back in 6 weeks.  Plan: 1. Repeat CBC early next week 2. Complete final 2 weeks of valganciclovir therapy 3. Followup in 6 weeks. They know to call me if her  diarrhea recurs before her next visit   Cliffton Asters, MD [facility] [phone] pager   [phone] cell 03/18/2013, 4:10 PM

## 2013-03-19 ENCOUNTER — Telehealth: Payer: Self-pay | Admitting: *Deleted

## 2013-03-19 NOTE — Telephone Encounter (Signed)
Got application back from [place] today.  Faxed to [place].

## 2013-03-20 ENCOUNTER — Ambulatory Visit (INDEPENDENT_AMBULATORY_CARE_PROVIDER_SITE_OTHER): Payer: Medicare Other | Admitting: General Practice

## 2013-03-21 ENCOUNTER — Telehealth: Payer: Self-pay | Admitting: *Deleted

## 2013-03-21 NOTE — Telephone Encounter (Signed)
Patient's daughter called with concerns about new symptoms mother is having. She c/o nausea, diarrhea, and trouble swallowing. She says the pain is in her upper chest area and not her throat. She was previously on celcept when she had these symptoms in the past, please advise. Wendall Mola

## 2013-03-22 ENCOUNTER — Emergency Department (HOSPITAL_COMMUNITY): Payer: Medicare Other

## 2013-03-22 ENCOUNTER — Telehealth: Payer: Self-pay | Admitting: *Deleted

## 2013-03-22 ENCOUNTER — Inpatient Hospital Stay (HOSPITAL_COMMUNITY)
Admission: EM | Admit: 2013-03-22 | Discharge: 2013-04-02 | DRG: 335 | Disposition: A | Discharge status: Rehabilitation Facility | Payer: Medicare Other | Attending: Internal Medicine | Admitting: Internal Medicine

## 2013-03-22 ENCOUNTER — Encounter (HOSPITAL_COMMUNITY): Payer: Self-pay | Admitting: Emergency Medicine

## 2013-03-22 ENCOUNTER — Other Ambulatory Visit: Payer: Self-pay | Admitting: Internal Medicine

## 2013-03-22 ENCOUNTER — Telehealth: Payer: Self-pay | Admitting: Internal Medicine

## 2013-03-22 DIAGNOSIS — I4891 Unspecified atrial fibrillation: Secondary | ICD-10-CM | POA: Diagnosis present

## 2013-03-22 DIAGNOSIS — A0839 Other viral enteritis: Secondary | ICD-10-CM | POA: Diagnosis present

## 2013-03-22 DIAGNOSIS — I5030 Unspecified diastolic (congestive) heart failure: Secondary | ICD-10-CM | POA: Diagnosis present

## 2013-03-22 DIAGNOSIS — R609 Edema, unspecified: Secondary | ICD-10-CM | POA: Diagnosis present

## 2013-03-22 DIAGNOSIS — K439 Ventral hernia without obstruction or gangrene: Secondary | ICD-10-CM

## 2013-03-22 DIAGNOSIS — J9601 Acute respiratory failure with hypoxia: Secondary | ICD-10-CM | POA: Diagnosis present

## 2013-03-22 DIAGNOSIS — I959 Hypotension, unspecified: Secondary | ICD-10-CM | POA: Diagnosis present

## 2013-03-22 DIAGNOSIS — R197 Diarrhea, unspecified: Secondary | ICD-10-CM | POA: Diagnosis present

## 2013-03-22 DIAGNOSIS — K8010 Calculus of gallbladder with chronic cholecystitis without obstruction: Secondary | ICD-10-CM | POA: Diagnosis present

## 2013-03-22 DIAGNOSIS — M810 Age-related osteoporosis without current pathological fracture: Secondary | ICD-10-CM | POA: Diagnosis present

## 2013-03-22 DIAGNOSIS — R3989 Other symptoms and signs involving the genitourinary system: Secondary | ICD-10-CM | POA: Diagnosis present

## 2013-03-22 DIAGNOSIS — D72829 Elevated white blood cell count, unspecified: Secondary | ICD-10-CM | POA: Diagnosis present

## 2013-03-22 DIAGNOSIS — R112 Nausea with vomiting, unspecified: Secondary | ICD-10-CM

## 2013-03-22 DIAGNOSIS — I2789 Other specified pulmonary heart diseases: Secondary | ICD-10-CM | POA: Diagnosis present

## 2013-03-22 DIAGNOSIS — B259 Cytomegaloviral disease, unspecified: Secondary | ICD-10-CM | POA: Diagnosis present

## 2013-03-22 DIAGNOSIS — I252 Old myocardial infarction: Secondary | ICD-10-CM

## 2013-03-22 DIAGNOSIS — E860 Dehydration: Secondary | ICD-10-CM | POA: Diagnosis present

## 2013-03-22 DIAGNOSIS — Z86718 Personal history of other venous thrombosis and embolism: Secondary | ICD-10-CM

## 2013-03-22 DIAGNOSIS — D638 Anemia in other chronic diseases classified elsewhere: Secondary | ICD-10-CM | POA: Diagnosis present

## 2013-03-22 DIAGNOSIS — K660 Peritoneal adhesions (postprocedural) (postinfection): Secondary | ICD-10-CM | POA: Diagnosis present

## 2013-03-22 DIAGNOSIS — R748 Abnormal levels of other serum enzymes: Secondary | ICD-10-CM | POA: Diagnosis present

## 2013-03-22 DIAGNOSIS — E440 Moderate protein-calorie malnutrition: Secondary | ICD-10-CM

## 2013-03-22 DIAGNOSIS — I5020 Unspecified systolic (congestive) heart failure: Secondary | ICD-10-CM | POA: Diagnosis present

## 2013-03-22 DIAGNOSIS — R5381 Other malaise: Secondary | ICD-10-CM | POA: Diagnosis present

## 2013-03-22 DIAGNOSIS — E538 Deficiency of other specified B group vitamins: Secondary | ICD-10-CM | POA: Diagnosis present

## 2013-03-22 DIAGNOSIS — N179 Acute kidney failure, unspecified: Secondary | ICD-10-CM | POA: Diagnosis present

## 2013-03-22 DIAGNOSIS — F411 Generalized anxiety disorder: Secondary | ICD-10-CM | POA: Diagnosis present

## 2013-03-22 DIAGNOSIS — I509 Heart failure, unspecified: Secondary | ICD-10-CM | POA: Diagnosis present

## 2013-03-22 DIAGNOSIS — G7000 Myasthenia gravis without (acute) exacerbation: Secondary | ICD-10-CM | POA: Diagnosis present

## 2013-03-22 DIAGNOSIS — M7989 Other specified soft tissue disorders: Secondary | ICD-10-CM | POA: Diagnosis present

## 2013-03-22 DIAGNOSIS — I214 Non-ST elevation (NSTEMI) myocardial infarction: Secondary | ICD-10-CM

## 2013-03-22 DIAGNOSIS — D709 Neutropenia, unspecified: Secondary | ICD-10-CM | POA: Diagnosis present

## 2013-03-22 DIAGNOSIS — K828 Other specified diseases of gallbladder: Secondary | ICD-10-CM | POA: Diagnosis present

## 2013-03-22 DIAGNOSIS — Z6828 Body mass index (BMI) 28.0-28.9, adult: Secondary | ICD-10-CM

## 2013-03-22 DIAGNOSIS — R7989 Other specified abnormal findings of blood chemistry: Secondary | ICD-10-CM | POA: Diagnosis present

## 2013-03-22 DIAGNOSIS — E785 Hyperlipidemia, unspecified: Secondary | ICD-10-CM | POA: Diagnosis present

## 2013-03-22 DIAGNOSIS — E876 Hypokalemia: Secondary | ICD-10-CM | POA: Diagnosis present

## 2013-03-22 DIAGNOSIS — I5022 Chronic systolic (congestive) heart failure: Secondary | ICD-10-CM | POA: Diagnosis present

## 2013-03-22 DIAGNOSIS — I428 Other cardiomyopathies: Secondary | ICD-10-CM | POA: Diagnosis present

## 2013-03-22 DIAGNOSIS — Z8249 Family history of ischemic heart disease and other diseases of the circulatory system: Secondary | ICD-10-CM

## 2013-03-22 DIAGNOSIS — J9600 Acute respiratory failure, unspecified whether with hypoxia or hypercapnia: Secondary | ICD-10-CM | POA: Diagnosis present

## 2013-03-22 DIAGNOSIS — K56609 Unspecified intestinal obstruction, unspecified as to partial versus complete obstruction: Secondary | ICD-10-CM | POA: Diagnosis present

## 2013-03-22 DIAGNOSIS — E43 Unspecified severe protein-calorie malnutrition: Secondary | ICD-10-CM | POA: Diagnosis present

## 2013-03-22 DIAGNOSIS — N289 Disorder of kidney and ureter, unspecified: Secondary | ICD-10-CM

## 2013-03-22 DIAGNOSIS — I2489 Other forms of acute ischemic heart disease: Secondary | ICD-10-CM | POA: Diagnosis present

## 2013-03-22 DIAGNOSIS — Z8673 Personal history of transient ischemic attack (TIA), and cerebral infarction without residual deficits: Secondary | ICD-10-CM

## 2013-03-22 DIAGNOSIS — K430 Incisional hernia with obstruction, without gangrene: Principal | ICD-10-CM | POA: Diagnosis present

## 2013-03-22 DIAGNOSIS — I272 Pulmonary hypertension, unspecified: Secondary | ICD-10-CM | POA: Diagnosis present

## 2013-03-22 DIAGNOSIS — R600 Localized edema: Secondary | ICD-10-CM | POA: Diagnosis present

## 2013-03-22 DIAGNOSIS — I248 Other forms of acute ischemic heart disease: Secondary | ICD-10-CM | POA: Diagnosis present

## 2013-03-22 DIAGNOSIS — I10 Essential (primary) hypertension: Secondary | ICD-10-CM | POA: Diagnosis present

## 2013-03-22 DIAGNOSIS — E039 Hypothyroidism, unspecified: Secondary | ICD-10-CM | POA: Diagnosis present

## 2013-03-22 DIAGNOSIS — R1115 Cyclical vomiting syndrome unrelated to migraine: Secondary | ICD-10-CM | POA: Diagnosis present

## 2013-03-22 HISTORY — DX: Cytomegaloviral disease, unspecified: B25.9

## 2013-03-22 HISTORY — DX: Other viral enteritis: A08.39

## 2013-03-22 LAB — CBC WITH DIFFERENTIAL/PLATELET
Basophils Relative: 0 % (ref 0–1)
Eosinophils Absolute: 0 10*3/uL (ref 0.0–0.7)
Eosinophils Relative: 1 % (ref 0–5)
Hemoglobin: 10.4 g/dL — ABNORMAL LOW (ref 12.0–15.0)
Lymphocytes Relative: 24 % (ref 12–46)
Lymphs Abs: 0.7 10*3/uL (ref 0.7–4.0)
MCH: 30.4 pg (ref 26.0–34.0)
MCHC: 34.1 g/dL (ref 30.0–36.0)
Monocytes Absolute: 1.3 10*3/uL — ABNORMAL HIGH (ref 0.1–1.0)
Neutrophils Relative %: 28 % — ABNORMAL LOW (ref 43–77)
Platelets: 257 10*3/uL (ref 150–400)

## 2013-03-22 LAB — URINALYSIS W MICROSCOPIC + REFLEX CULTURE
Bilirubin Urine: NEGATIVE
Glucose, UA: NEGATIVE mg/dL
Hgb urine dipstick: NEGATIVE
Leukocytes, UA: NEGATIVE
Nitrite: NEGATIVE
Protein, ur: NEGATIVE mg/dL
Specific Gravity, Urine: 1.02 (ref 1.005–1.030)
Urine-Other: NONE SEEN
Urobilinogen, UA: 1 mg/dL (ref 0.0–1.0)

## 2013-03-22 LAB — COMPREHENSIVE METABOLIC PANEL
ALT: 9 U/L (ref 0–35)
CO2: 16 mEq/L — ABNORMAL LOW (ref 19–32)
Calcium: 9.5 mg/dL (ref 8.4–10.5)
Chloride: 100 mEq/L (ref 96–112)
Creatinine, Ser: 1.25 mg/dL — ABNORMAL HIGH (ref 0.50–1.10)
GFR calc Af Amer: 44 mL/min — ABNORMAL LOW (ref >90)
Glucose, Bld: 157 mg/dL — ABNORMAL HIGH (ref 70–99)
Sodium: 136 mEq/L (ref 135–145)
Total Bilirubin: 0.8 mg/dL (ref 0.3–1.2)
Total Protein: 5.8 g/dL — ABNORMAL LOW (ref 6.0–8.3)

## 2013-03-22 LAB — TROPONIN I: Troponin I: 0.87 ng/mL (ref <0.30)

## 2013-03-22 LAB — MRSA PCR SCREENING: MRSA by PCR: NEGATIVE

## 2013-03-22 LAB — LIPASE, BLOOD: Lipase: 15 U/L (ref 11–59)

## 2013-03-22 MED ORDER — SODIUM CHLORIDE 0.9 % IV BOLUS (SEPSIS)
500.0000 mL | Freq: Once | INTRAVENOUS | Status: AC
  Administered 2013-03-22: 500 mL via INTRAVENOUS

## 2013-03-22 MED ORDER — MYCOPHENOLATE MOFETIL 250 MG PO CAPS
1000.0000 mg | ORAL_CAPSULE | Freq: Two times a day (BID) | ORAL | Status: DC
Start: 2013-03-22 — End: 2013-04-02
  Administered 2013-03-22 – 2013-04-02 (×14): 1000 mg via ORAL
  Filled 2013-03-22 (×23): qty 4

## 2013-03-22 MED ORDER — DIGOXIN 0.25 MG/ML IJ SOLN
0.2500 mg | Freq: Four times a day (QID) | INTRAMUSCULAR | Status: AC
  Administered 2013-03-22 – 2013-03-23 (×4): 0.25 mg via INTRAVENOUS
  Filled 2013-03-22 (×6): qty 1

## 2013-03-22 MED ORDER — ATORVASTATIN CALCIUM 20 MG PO TABS
20.0000 mg | ORAL_TABLET | Freq: Every day | ORAL | Status: DC
  Filled 2013-03-22 (×3): qty 1

## 2013-03-22 MED ORDER — DILTIAZEM HCL 100 MG IV SOLR
5.0000 mg/h | INTRAVENOUS | Status: DC
  Filled 2013-03-22: qty 100

## 2013-03-22 MED ORDER — GLUCOSAMINE-CHONDROITIN 500-400 MG PO TABS: 1.0000 | ORAL_TABLET | Freq: Every day | ORAL | Status: DC

## 2013-03-22 MED ORDER — SODIUM CHLORIDE 0.9 % IJ SOLN
3.0000 mL | Freq: Two times a day (BID) | INTRAMUSCULAR | Status: DC
  Administered 2013-03-24: 10 mL via INTRAVENOUS
  Administered 2013-03-25 – 2013-03-27 (×4): 3 mL via INTRAVENOUS
  Administered 2013-03-27: 10 mL via INTRAVENOUS
  Administered 2013-03-28 – 2013-04-02 (×6): 3 mL via INTRAVENOUS

## 2013-03-22 MED ORDER — DIGOXIN 0.25 MG/ML IJ SOLN
0.1250 mg | Freq: Once | INTRAMUSCULAR | Status: DC
  Filled 2013-03-22: qty 0.5

## 2013-03-22 MED ORDER — PROMETHAZINE HCL 25 MG/ML IJ SOLN
6.2500 mg | Freq: Four times a day (QID) | INTRAMUSCULAR | Status: DC | PRN
  Administered 2013-03-22: 6.25 mg via INTRAVENOUS
  Filled 2013-03-22 (×2): qty 1

## 2013-03-22 MED ORDER — MYCOPHENOLATE MOFETIL 500 MG PO TABS: 1000.0000 mg | ORAL_TABLET | Freq: Two times a day (BID) | ORAL | Status: DC

## 2013-03-22 MED ORDER — FAMOTIDINE 20 MG PO TABS
20.0000 mg | ORAL_TABLET | Freq: Two times a day (BID) | ORAL | Status: DC
  Administered 2013-03-22: 20 mg via ORAL
  Filled 2013-03-22 (×3): qty 1

## 2013-03-22 MED ORDER — ASPIRIN 81 MG PO CHEW
324.0000 mg | CHEWABLE_TABLET | Freq: Once | ORAL | Status: AC
  Administered 2013-03-22: 324 mg via ORAL
  Filled 2013-03-22: qty 4

## 2013-03-22 MED ORDER — ADULT MULTIVITAMIN W/MINERALS CH
1.0000 | ORAL_TABLET | Freq: Every day | ORAL | Status: DC
  Administered 2013-03-22 – 2013-04-02 (×8): 1 via ORAL
  Filled 2013-03-22 (×12): qty 1

## 2013-03-22 MED ORDER — CYANOCOBALAMIN 1000 MCG/ML IJ SOLN: 1000.0000 ug | INTRAMUSCULAR | Status: DC

## 2013-03-22 MED ORDER — SODIUM CHLORIDE 0.9 % IV SOLN
INTRAVENOUS | Status: DC
  Administered 2013-03-22 – 2013-03-23 (×3): via INTRAVENOUS
  Administered 2013-03-23: 500 mL via INTRAVENOUS
  Administered 2013-03-23 – 2013-03-24 (×3): via INTRAVENOUS
  Administered 2013-03-24: 125 mL via INTRAVENOUS
  Administered 2013-03-25: 06:00:00 via INTRAVENOUS
  Administered 2013-03-25: 1000 mL via INTRAVENOUS
  Administered 2013-03-26 (×2): via INTRAVENOUS
  Administered 2013-03-26: 125 mL via INTRAVENOUS
  Administered 2013-03-28: 04:00:00 via INTRAVENOUS
  Administered 2013-03-28: 20 mL/h via INTRAVENOUS

## 2013-03-22 MED ORDER — SODIUM CHLORIDE 0.9 % IV BOLUS (SEPSIS): 1000.0000 mL | Freq: Once | INTRAVENOUS | Status: DC

## 2013-03-22 MED ORDER — BUPROPION HCL ER (XL) 150 MG PO TB24
150.0000 mg | ORAL_TABLET | Freq: Every day | ORAL | Status: DC
  Filled 2013-03-22 (×3): qty 1

## 2013-03-22 MED ORDER — PROMETHAZINE HCL 25 MG/ML IJ SOLN
6.2500 mg | Freq: Once | INTRAMUSCULAR | Status: AC
  Administered 2013-03-22: 6.25 mg via INTRAVENOUS
  Filled 2013-03-22: qty 1

## 2013-03-22 MED ORDER — DIGOXIN 0.25 MG/ML IJ SOLN
0.2500 mg | Freq: Every day | INTRAMUSCULAR | Status: DC
  Filled 2013-03-22: qty 1

## 2013-03-22 MED ORDER — ALPRAZOLAM 1 MG PO TABS
1.0000 mg | ORAL_TABLET | Freq: Every evening | ORAL | Status: DC | PRN
  Administered 2013-03-23: 1 mg via ORAL
  Filled 2013-03-22: qty 1

## 2013-03-22 MED ORDER — LEVOTHYROXINE SODIUM 125 MCG PO TABS
125.0000 ug | ORAL_TABLET | Freq: Every day | ORAL | Status: DC
  Administered 2013-03-23: 125 ug via ORAL
  Filled 2013-03-22 (×3): qty 1

## 2013-03-22 NOTE — Telephone Encounter (Signed)
I spoke with her daughter, [name], who tells me that Ms. Stoltzfus has been having some intermittent nausea, vomiting and mild diarrhea for the past week. She says that her mother tries to hide it from her and her doctors. After the nausea and vomiting began she also started to complain of some pain on swallowing. Her blood work drawn this week at home showed a white blood cell count of 1700, hemoglobin of 9.8 and platelets of 173,000. I recommended having her stop valacyclovir now and she will see me in clinic on November 3.

## 2013-03-22 NOTE — Consult Note (Addendum)
Admit date: 03/22/2013 Referring Physician  Dr. Julian Reil Primary Physician  Dr. Gershon Crane Primary Cardiologist  Dr. Sanjuana Kava Reason for Consultation  Elevated cardiac enzymes  HPI: [name] is a 79 y.o. female who presents to the ED with increased N/V over the past week. She has a history of UC and chronic problems with CMV colitis. Minimal nausea at baseline that significantly worsens with PO intake. Last night she was throwing up even water. Vomiting is bilious but non bloody.  Per daughter it is difficult to get the patient to admit to any complaints. Numerous problems were identified in the ED requiring admission (see A/P for the full list). She has a history of HTN, dyslipidemia and remote history of cath with nonobstructive ASCAD by Dr. Alcide Clever, diastolic CHF felt secondary to afib with RVR in 2001.  She was found on exam to have an incarcerated incisional hernia concerning for SBO and a surgery eval was done.  She was also found to be in afib with RVR at 140bpm.  She is in acute renal failure felt secondary to severe dehydration from nausea and vomiting.  Cardiology is asked to consult for management of afib with RVR in setting of hypotension and elevated cardiac enzymes.  She denies any chest pain or pressure but has intermittently had a sensation of a stuck feeling in the center of her chest and throws after taking pills.  She denies any SOB.        PMH:   Past Medical History  Diagnosis Date  . Anemia   . Hyperlipemia   . Hypertension   . Thyroid disease     hypothyroidism  . Anxiety   . Paroxysmal atrial fibrillation   . Vitamin B 12 deficiency   . CHF (congestive heart failure)   . TIA (transient ischemic attack)     4-09  . Microscopic hematuria     benign microscopic hematuria, worked up b Dr Alexis Frock  . Gall stone     porcelain gall bladder with single stone  per CT 02-2009 Dr Birdie Sons  . Osteoporosis     DEXA on 04-30-10  . Myasthenia gravis     sees Dr. Terrace Arabia   . Ulcerative colitis   . DVT of leg (deep venous thrombosis)     left leg   . CMV colitis      PSH:   Past Surgical History  Procedure Laterality Date  . Cardiac catheterization      04/1999  . Abdominal hysterectomy      with oophorectomy  . Colonoscopy  1999    normal   . Colonoscopy N/A 12/10/2012    Procedure: COLONOSCOPY;  Surgeon: Iva Boop, MD;  Location: [facility];  Service: Endoscopy;  Laterality: N/A;  . Flexible sigmoidoscopy N/A 01/26/2013    Procedure: FLEXIBLE SIGMOIDOSCOPY;  Surgeon: Iva Boop, MD;  Location: [facility];  Service: Endoscopy;  Laterality: N/A;    Allergies:  Zofran; Furosemide; Ivp dye; and Sulfamethoxazole Prior to Admit Meds:   Prescriptions prior to admission  Medication Sig Dispense Refill  . ALPRAZolam (XANAX) 0.25 MG tablet Take 1 tablet (0.25 mg total) by mouth 2 (two) times daily as needed for anxiety.  30 tablet  0  . ALPRAZolam (XANAX) 1 MG tablet Take 1 mg by mouth at bedtime as needed for sleep.      Marland Kitchen atorvastatin (LIPITOR) 20 MG tablet Take 1 tablet (20 mg total) by mouth daily with supper.  30 tablet  1  .  buPROPion (WELLBUTRIN XL) 150 MG 24 hr tablet Take 1 tablet (150 mg total) by mouth every morning.  30 tablet  11  . co-enzyme Q-10 50 MG capsule Take 1 capsule (50 mg total) by mouth at bedtime.  30 capsule  1  . cyanocobalamin (,VITAMIN B-12,) 1000 MCG/ML injection Inject 1,000 mcg into the muscle every 30 (thirty) days.      . feeding supplement (ENSURE COMPLETE) LIQD Take 237 mLs by mouth daily.  30 Bottle  0  . fish oil-omega-3 fatty acids 1000 MG capsule Take 1 capsule (1 g total) by mouth daily.  30 capsule  1  . glucosamine-chondroitin 500-400 MG tablet Take 1-2 tablets by mouth daily. Alternates taking 1 and 2 capsules.      . irbesartan-hydrochlorothiazide (AVALIDE) 300-12.5 MG per tablet Take 1 tablet by mouth daily.  30 tablet  11  . levothyroxine (SYNTHROID, LEVOTHROID) 125 MCG tablet Take 1  tablet (125 mcg total) by mouth daily.  30 tablet  11  . loperamide (IMODIUM) 2 MG capsule Take 1 capsule (2 mg total) by mouth 3 (three) times daily.  90 capsule  0  . Multiple Vitamin (MULTIVITAMIN WITH MINERALS) TABS tablet Take 1 tablet by mouth daily.      . mycophenolate (CELLCEPT) 500 MG tablet Take 2 tablets (1,000 mg total) by mouth 2 (two) times daily.  60 tablet  1  . potassium chloride (K-DUR) 10 MEQ tablet Take 20 mEq by mouth 3 (three) times daily.       . Probiotic Product (PROBIOTIC DAILY PO) Take 1 tablet by mouth daily.       . ranitidine (ZANTAC) 150 MG tablet Take 150 mg by mouth daily.      Marland Kitchen torsemide (DEMADEX) 10 MG tablet Take 10 mg by mouth 2 (two) times daily with a meal.      . vitamin C (ASCORBIC ACID) 250 MG tablet Take 250 mg by mouth daily.       Fam HX:    Family History  Problem Relation Age of Onset  . Heart disease Father    Social HX:    History   Social History  . Marital Status: Widowed    Spouse Name: N/A    Number of Children: N/A  . Years of Education: N/A   Occupational History  . Not on file.   Social History Main Topics  . Smoking status: Never Smoker   . Smokeless tobacco: Never Used  . Alcohol Use: No  . Drug Use: No  . Sexual Activity: No   Other Topics Concern  . Not on file   Social History Narrative  . No narrative on file     ROS:  All 11 ROS were addressed and are negative except what is stated in the HPI  Physical Exam: Blood pressure 72/49, pulse 144, temperature 98.3 F (36.8 C), temperature source Oral, resp. rate 20, SpO2 100.00%.    General: Well developed, well nourished, in no acute distress Head: Eyes PERRLA, No xanthomas.   Normal cephalic and atramatic  Lungs:   Clear bilaterally to auscultation and percussion. Heart:   Irregularly irregular and tachy S1 S2 Pulses are 2+ & equal.            No carotid bruit. No JVD.  No abdominal bruits. No femoral bruits. Abdomen: Bowel sounds are positive, abdomen  soft and non-tender without masses or  Hernia's noted. Msk:  Back normal, normal gait. Normal strength and tone for age. Extremities:   No clubbing, cyanosis or edema.  DP +1 Neuro: Alert and oriented X 3. Psych:  Good affect, responds appropriately    Labs:   Lab Results  Component Value Date   WBC 2.8* 03/22/2013   HGB 10.4* 03/22/2013   HCT 30.5* 03/22/2013   MCV 89.2 03/22/2013   PLT 257 03/22/2013    Recent Labs Lab 03/22/13 1450  NA 136  K 3.8  CL 100  CO2 16*  BUN 24*  CREATININE 1.25*  CALCIUM 9.5  PROT 5.8*  BILITOT 0.8  ALKPHOS 65  ALT 9  AST 19  GLUCOSE 157*   No results found for this basename: PTT   Lab Results  Component Value Date   INR 1.52* 01/29/2013   INR 2.33* 01/28/2013   INR 3.33* 01/27/2013   Lab Results  Component Value Date   TROPONINI 1.87* 03/22/2013     Lab Results  Component Value Date   CHOL 126 03/07/2012   CHOL 162 03/02/2011   CHOL 168 02/22/2010   Lab Results  Component Value Date   HDL 32.30* 03/07/2012   HDL 42.50 03/02/2011   HDL 41.00 02/22/2010   Lab Results  Component Value Date   LDLCALC 63 12/26/2007   LDLCALC 73 11/08/2006   Lab Results  Component Value Date   TRIG 275.0* 03/07/2012   TRIG 337.0* 03/02/2011   TRIG 292.0* 02/22/2010   Lab Results  Component Value Date   CHOLHDL 4 03/07/2012   CHOLHDL 4 03/02/2011   CHOLHDL 4 02/22/2010   Lab Results  Component Value Date   LDLDIRECT 59.0 03/07/2012   LDLDIRECT 75.2 03/02/2011   LDLDIRECT 85.7 02/22/2010      Radiology:  Ct Abdomen Pelvis Wo Contrast  03/22/2013   CLINICAL DATA:  Mid abdominal pain, nausea, vomiting, history hyperlipidemia, hypertension, CHF, myasthenia gravis, ulcerative colitis, DVT  EXAM: CT ABDOMEN AND PELVIS WITHOUT CONTRAST  TECHNIQUE: Multidetector CT imaging of the abdomen and pelvis was performed following the standard protocol without intravenous contrast. Sagittal and coronal MPR images reconstructed from axial  data set. Patient drank dilute oral contrast for exam  COMPARISON:  04/07/2009  FINDINGS: Dense mitral annular calcification.  Chronic interstitial changes/fibrosis at lung bases.  Small pericardial effusion.  Small cyst at inferior pole left kidney 3.3 x 3.5 cm image 38.  Within limits of a nonenhanced exam, no other focal abnormalities of the liver, spleen, pancreas, kidneys, or adrenal glands.  Porcelain gallbladder, difficult to exclude a coexistent large calcified gallstone internally.  IVC filter.  Scattered atherosclerotic calcifications.  Umbilical hernia containing fat with an additional infraumbilical hernia containing fat and a small bowel loop with associated mesenteric edema.  Mild dilatation of proximal small bowel loops with decompression of distal small bowel loops likely representing a degree of a small bowel obstruction at the hernia.  Colon shows mild diffuse questionable bowel wall thickening which may be related to a history of ulcerative colitis.  No pericolic inflammatory changes.  Stomach unremarkable.  Normal appearing bladder and ureters.  Uterus surgically absent with nonvisualization of ovaries.  Question normal appendix.  No mass, adenopathy, or acute bone lesion.  Diffuse osseous demineralization identified with evidence of L4-L5 fusion  IMPRESSION: Infraumbilical hernia containing a small bowel loop with likely a component of small bowel obstruction and mild bowel wall edema at the site.  Porcelain gallbladder, cannot exclude coexistent internal gallstone.  Small pericardial  effusion.  Tiny umbilical hernia containing fat.  Mild diffuse thickening of the colon is questioned, which may be related to history of ulcerative colitis.   Electronically Signed   By: Ulyses Southward M.D.   On: 03/22/2013 18:17   Dg Chest 2 View  03/22/2013   CLINICAL DATA:  Nausea and vomiting  EXAM: CHEST  2 VIEW  COMPARISON:  None.  FINDINGS: The cardiac shadow is within normal limits. The lungs are well  aerated bilaterally. No focal infiltrate or effusion is seen. A right chest wall port is noted. No acute bony abnormality is noted.  IMPRESSION: No acute abnormality seen.   Electronically Signed   By: Alcide Clever M.D.   On: 03/22/2013 16:31    EKG:  Atrial fibrillation with RVR and nonspecific IVCD  ASSESSMENT:  1.  Afib with RVR most likely triggered by acute illness with N/V and ? SBO.  She was in NSR by EKG 01/29/2013 2.  NSTEMI most likely secondary to demand ischemia 3.  Incarcerated hernia with possible SBO 4.  Nausea and vomiting secondary to #3 5.  Dehydration secondary to #4 6.  Acute renal failure secondary to prerenal azotemia from dehydration and hypotension 7.  Acute hypotension multifactorial from dehydration, afib with RVR. 8.  Neutropenia  9.  Anemia 10.  Grade 2 diastolic dysfunction by echo 01/2013  PLAN:   1.  IVF resuscitation for hypotension 2.  Stop Cardizem due to hypotension 3.  IV digoxin 0.25mg  IV q 6 hours x 4 doses then 0.25mg  IV daily 4.  Low dose beta blocker as BP tolerates for rate control.   5.  May need to consider IV Amio if rate cannot be controlled on beta blockers 6.  2D echo in am once rate controlled to rule out RWMA's give NSTEMI 7.  Anticoagulation on hold due to risk of GI bleed with possible incarcerated hernia - again I do not think this is an acute coronary syndrome so will not pursue anticoagulation at this time due to above stated risks  Quintella Reichert, MD  03/22/2013  9:59 PM

## 2013-03-22 NOTE — Telephone Encounter (Signed)
Please see if you can locate the results of the CBC that advanced care was going to draw this week and forward them to me along with the daughter's name and contact phone number.

## 2013-03-22 NOTE — ED Provider Notes (Signed)
CSN: [id number]     Arrival date & time 03/22/13  1414 History   First MD Initiated Contact with Patient 03/22/13 1508     Chief Complaint  Patient presents with  . Nausea  . Emesis  . Abdominal Pain    HPI Pt was seen at 1525.  Per pt and her family, c/o gradual onset and persistence of multiple intermittent episodes of N/V for the past 5 days. Has been associated with upper abd "tight" pain. Has been unable to tol PO due to N/V. 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 Pt states she was evaluated by her ID MD 4 days ago who told her "my WBC count was low," and was instructed to stop taking valacyclovir. Denies diarrhea, no black or blood in stools, no fevers, no back pain, no rash, no CP/SOB.    Past Medical History  Diagnosis Date  . Anemia   . Hyperlipemia   . Hypertension   . Thyroid disease     hypothyroidism  . Anxiety   . Paroxysmal atrial fibrillation   . Vitamin B 12 deficiency   . CHF (congestive heart failure)   . TIA (transient ischemic attack)     4-09  . Microscopic hematuria     benign microscopic hematuria, worked up b Dr Alexis Frock  . Gall stone     porcelain gall bladder with single stone  per CT 02-2009 Dr Birdie Sons  . Osteoporosis     DEXA on 04-30-10  . Myasthenia gravis     sees Dr. Terrace Arabia   . Ulcerative colitis   . DVT of leg (deep venous thrombosis)     left leg   . CMV colitis    Past Surgical History  Procedure Laterality Date  . Cardiac catheterization      04/1999  . Abdominal hysterectomy      with oophorectomy  . Colonoscopy  1999    normal   . Colonoscopy N/A 12/10/2012    Procedure: COLONOSCOPY;  Surgeon: Iva Boop, MD;  Location: [facility];  Service: Endoscopy;  Laterality: N/A;  . Flexible sigmoidoscopy N/A 01/26/2013    Procedure: FLEXIBLE SIGMOIDOSCOPY;  Surgeon: Iva Boop, MD;  Location: [facility];  Service: Endoscopy;  Laterality:  N/A;   Family History  Problem Relation Age of Onset  . Heart disease Father    History  Substance Use Topics  . Smoking status: Never Smoker   . Smokeless tobacco: Never Used  . Alcohol Use: No    Review of Systems ROS: Statement: All systems negative except as marked or noted in the HPI; Constitutional: Negative for fever and chills. ; ; Eyes: Negative for eye pain, redness and discharge. ; ; ENMT: Negative for ear pain, hoarseness, nasal congestion, sinus pressure and sore throat. ; ; Cardiovascular: Negative for chest pain, palpitations, diaphoresis, dyspnea and peripheral edema. ; ; Respiratory: Negative for cough, wheezing and stridor. ; ; Gastrointestinal: +N/V, abd pain. Negative for diarrhea, blood in stool, hematemesis, jaundice and rectal bleeding. . ; ; Genitourinary: Negative for dysuria, flank pain and hematuria. ; ; Musculoskeletal: Negative for back pain and neck pain. Negative for swelling and trauma.; ; Skin: Negative for pruritus, rash, abrasions, blisters, bruising and skin lesion.; ; Neuro: Negative for headache, lightheadedness and neck stiffness. Negative for weakness, altered level of consciousness , altered mental status, extremity weakness, paresthesias, involuntary movement, seizure and  syncope.      Allergies  Zofran; Furosemide; Ivp dye; and Sulfamethoxazole  Home Medications   Current Outpatient Rx  Name  Route  Sig  Dispense  Refill  . ALPRAZolam (XANAX) 0.25 MG tablet   Oral   Take 1 tablet (0.25 mg total) by mouth 2 (two) times daily as needed for anxiety.   30 tablet   0   . ALPRAZolam (XANAX) 1 MG tablet   Oral   Take 1 mg by mouth at bedtime as needed for sleep.         Marland Kitchen atorvastatin (LIPITOR) 20 MG tablet   Oral   Take 1 tablet (20 mg total) by mouth daily with supper.   30 tablet   1   . buPROPion (WELLBUTRIN XL) 150 MG 24 hr tablet   Oral   Take 1 tablet (150 mg total) by mouth every morning.   30 tablet   11   . co-enzyme Q-10  50 MG capsule   Oral   Take 1 capsule (50 mg total) by mouth at bedtime.   30 capsule   1   . cyanocobalamin (,VITAMIN B-12,) 1000 MCG/ML injection   Intramuscular   Inject 1,000 mcg into the muscle every 30 (thirty) days.         . feeding supplement (ENSURE COMPLETE) LIQD   Oral   Take 237 mLs by mouth daily.   30 Bottle   0   . fish oil-omega-3 fatty acids 1000 MG capsule   Oral   Take 1 capsule (1 g total) by mouth daily.   30 capsule   1   . glucosamine-chondroitin 500-400 MG tablet   Oral   Take 1-2 tablets by mouth daily. Alternates taking 1 and 2 capsules.         . irbesartan-hydrochlorothiazide (AVALIDE) 300-12.5 MG per tablet   Oral   Take 1 tablet by mouth daily.   30 tablet   11   . levothyroxine (SYNTHROID, LEVOTHROID) 125 MCG tablet   Oral   Take 1 tablet (125 mcg total) by mouth daily.   30 tablet   11   . loperamide (IMODIUM) 2 MG capsule   Oral   Take 1 capsule (2 mg total) by mouth 3 (three) times daily.   90 capsule   0   . Multiple Vitamin (MULTIVITAMIN WITH MINERALS) TABS tablet   Oral   Take 1 tablet by mouth daily.         . mycophenolate (CELLCEPT) 500 MG tablet   Oral   Take 2 tablets (1,000 mg total) by mouth 2 (two) times daily.   60 tablet   1   . potassium chloride (K-DUR) 10 MEQ tablet   Oral   Take 20 mEq by mouth 3 (three) times daily.          . Probiotic Product (PROBIOTIC DAILY PO)   Oral   Take 1 tablet by mouth daily.          . ranitidine (ZANTAC) 150 MG tablet   Oral   Take 150 mg by mouth daily.         Marland Kitchen torsemide (DEMADEX) 10 MG tablet   Oral   Take 10 mg by mouth 2 (two) times daily with a meal.         . vitamin C (ASCORBIC ACID) 250 MG tablet   Oral   Take 250 mg by mouth daily.          BP  143/80  Pulse 91  Temp(Src) 98.1 F (36.7 C) (Oral)  Resp 18  SpO2 99% Physical Exam 1530: Physical examination:  Nursing notes reviewed; Vital signs and O2 SAT reviewed;   Constitutional: Well developed, Well nourished, In no acute distress; Head:  Normocephalic, atraumatic; Eyes: EOMI, PERRL, No scleral icterus; ENMT: Mouth and pharynx normal, Mucous membranes dry; Neck: Supple, Full range of motion, No lymphadenopathy; Cardiovascular: Regular rate and rhythm, No gallop; Respiratory: Breath sounds clear & equal bilaterally, No wheezes.  Speaking full sentences with ease, Normal respiratory effort/excursion; Chest: Nontender, Movement normal; Abdomen: Soft, Nontender, Nondistended, Normal bowel sounds. Easily reducible NT umbilical hernia. Lower abd ventral wall hernia NT, unable to reduce, no overlying skin erythema or ecchymosis.; Genitourinary: No CVA tenderness; Extremities: Pulses normal, No tenderness, No edema, No calf edema or asymmetry.; Neuro: AA&Ox3, Major CN grossly intact.  Speech clear. No gross focal motor or sensory deficits in extremities.; Skin: Color normal, Warm, Dry.   ED Course  Procedures   1535:  Umbilical hernia easily reducible. Unable to reduce lower abd ventral wall hernia. Pt states the hernia "has been like that for as long as I can remember." Denies pain at site. NT to palp. Will obtain CT A/P.   1640:  Troponin elevated. EKG without acute STTW changes. Pt now endorses she had mid-sternal chest "pain" yesterday, since resolved. Described it as "it felt like I couldn't swallow." Denies choking. Denies any CP today, denies CP currently.   1830:  T/C to [facility]: states abd wall hernia defect has wide neck, there is small difference in small bowel dilatation in/out of hernia so does have a degree of SBO. Pt with N/V after being given PO contrast. Medicated for nausea and states she feels "better." BUN/Cr elevated compared to baseline, appears clinically dehydrated. H/H stable. Abd remains soft/NT on re-exam. Dx and testing d/w pt and family.  Questions answered.  Verb understanding, agreeable to admit.  T/C to General Surgery Dr. Maisie Fus, case  discussed, including:  HPI, pertinent PM/SHx, VS/PE, dx testing, ED course and treatment:  Agreeable to consult, requests to admit to medicine service. T/C to [facility] Dr. Julian Reil, case discussed, including:  HPI, pertinent PM/SHx, VS/PE, dx testing, ED course and treatment:  Agreeable to admit, requests he will come to ED for eval.   2030:  2nd troponin continues to elevate. [facility] Dr. Julian Reil already at bedside and has evaluated pt; made aware of 2nd troponin elevation. T/C to Cardiology Dr. Mayford Knife, case discussed, including:  HPI, pertinent PM/SHx, VS/PE, dx testing, ED course and treatment:  states OK for [facility] to continue admission to [facility], Cards will consult there.    EKG Interpretation     Ventricular Rate:  96 PR Interval:  181 QRS Duration: 132 QT Interval:  413 QTC Calculation: 522 R Axis:   -29 Text Interpretation:  Sinus rhythm Left axis deviation IVCD, consider atypical LBBB. Abnormal EKG. No significant changes from previous changes from previous EKG dated 01/29/2013.          MDM  MDM Reviewed: previous chart, nursing note and vitals Reviewed previous: labs and ECG Interpretation: labs, ECG, x-ray and CT scan Total time providing critical care: 30-74 minutes. This excludes time spent performing separately reportable procedures and services. Consults: admitting MD, general surgery and cardiology   CRITICAL CARE Performed by: Laray Anger Total critical care time: 35 Critical care time was exclusive of separately billable procedures and treating other patients. Critical care was necessary to treat or  prevent imminent or life-threatening deterioration. Critical care was time spent personally by me on the following activities: development of treatment plan with patient and/or surrogate as well as nursing, discussions with consultants, evaluation of patient's response to treatment, examination of patient, obtaining history from patient or surrogate, ordering and  performing treatments and interventions, ordering and review of laboratory studies, ordering and review of radiographic studies, pulse oximetry and re-evaluation of patient's condition.   Results for TAHIRAH, SARA (MRN [id number]) as of 03/22/2013 17:06  Ref. Range 02/03/2013 04:12 02/05/2013 05:00 02/07/2013 05:00 02/09/2013 23:00 03/22/2013 14:50  Hemoglobin Latest Range: 12.0-15.0 g/dL 7.7 (L) 7.8 (L) 7.7 (L) 7.7 (L) 10.4 (L)  HCT Latest Range: 36.0-46.0 % 23.1 (L) 23.4 (L) 22.4 (L) 23.0 (L) 30.5 (L)     Results for TAI, SYFERT (MRN [id number]) as of 03/22/2013 17:06  Ref. Range 02/09/2013 22:00 02/11/2013 05:00 02/12/2013 05:00 02/26/2013 12:31 03/22/2013 14:50  BUN Latest Range: 6-23 mg/dL 7 10 6 11 24  (H)  Creatinine Latest Range: 0.50-1.10 mg/dL 7.82 9.56 2.13 0.9 0.86 (H)    Results for orders placed during the hospital encounter of 03/22/13  CBC WITH DIFFERENTIAL      Result Value Range   WBC 2.8 (*) 4.0 - 10.5 K/uL   RBC 3.42 (*) 3.87 - 5.11 MIL/uL   Hemoglobin 10.4 (*) 12.0 - 15.0 g/dL   HCT 57.8 (*) 46.9 - 62.9 %   MCV 89.2  78.0 - 100.0 fL   MCH 30.4  26.0 - 34.0 pg   MCHC 34.1  30.0 - 36.0 g/dL   RDW 52.8 (*) 41.3 - 24.4 %   Platelets 257  150 - 400 K/uL   Neutrophils Relative % 28 (*) 43 - 77 %   Lymphocytes Relative 24  12 - 46 %   Monocytes Relative 47 (*) 3 - 12 %   Eosinophils Relative 1  0 - 5 %   Basophils Relative 0  0 - 1 %   Neutro Abs 0.8 (*) 1.7 - 7.7 K/uL   Lymphs Abs 0.7  0.7 - 4.0 K/uL   Monocytes Absolute 1.3 (*) 0.1 - 1.0 K/uL   Eosinophils Absolute 0.0  0.0 - 0.7 K/uL   Basophils Absolute 0.0  0.0 - 0.1 K/uL   RBC Morphology POLYCHROMASIA PRESENT    COMPREHENSIVE METABOLIC PANEL      Result Value Range   Sodium 136  135 - 145 mEq/L   Potassium 3.8  3.5 - 5.1 mEq/L   Chloride 100  96 - 112 mEq/L   CO2 16 (*) 19 - 32 mEq/L   Glucose, Bld 157 (*) 70 - 99 mg/dL   BUN 24 (*) 6 - 23 mg/dL   Creatinine, Ser 0.10 (*) 0.50 - 1.10 mg/dL   Calcium  9.5  8.4 - 27.2 mg/dL   Total Protein 5.8 (*) 6.0 - 8.3 g/dL   Albumin 2.8 (*) 3.5 - 5.2 g/dL   AST 19  0 - 37 U/L   ALT 9  0 - 35 U/L   Alkaline Phosphatase 65  39 - 117 U/L   Total Bilirubin 0.8  0.3 - 1.2 mg/dL   GFR calc non Af Amer 38 (*) >90 mL/min   GFR calc Af Amer 44 (*) >90 mL/min  LIPASE, BLOOD      Result Value Range   Lipase 15  11 - 59 U/L  URINALYSIS W MICROSCOPIC + REFLEX CULTURE      Result Value Range   Color, Urine  YELLOW  YELLOW   APPearance CLEAR  CLEAR   Specific Gravity, Urine 1.020  1.005 - 1.030   pH 5.5  5.0 - 8.0   Glucose, UA NEGATIVE  NEGATIVE mg/dL   Hgb urine dipstick NEGATIVE  NEGATIVE   Bilirubin Urine NEGATIVE  NEGATIVE   Ketones, ur 15 (*) NEGATIVE mg/dL   Protein, ur NEGATIVE  NEGATIVE mg/dL   Urobilinogen, UA 1.0  0.0 - 1.0 mg/dL   Nitrite NEGATIVE  NEGATIVE   Leukocytes, UA NEGATIVE  NEGATIVE   Urine-Other       Value: NO FORMED ELEMENTS SEEN ON URINE MICROSCOPIC EXAMINATION  TROPONIN I      Result Value Range   Troponin I 0.87 (*) <0.30 ng/mL  LACTIC ACID, PLASMA      Result Value Range   Lactic Acid, Venous 1.1  0.5 - 2.2 mmol/L   Ct Abdomen Pelvis Wo Contrast 03/22/2013   CLINICAL DATA:  Mid abdominal pain, nausea, vomiting, history hyperlipidemia, hypertension, CHF, myasthenia gravis, ulcerative colitis, DVT  EXAM: CT ABDOMEN AND PELVIS WITHOUT CONTRAST  TECHNIQUE: Multidetector CT imaging of the abdomen and pelvis was performed following the standard protocol without intravenous contrast. Sagittal and coronal MPR images reconstructed from axial data set. Patient drank dilute oral contrast for exam  COMPARISON:  04/07/2009  FINDINGS: Dense mitral annular calcification.  Chronic interstitial changes/fibrosis at lung bases.  Small pericardial effusion.  Small cyst at inferior pole left kidney 3.3 x 3.5 cm image 38.  Within limits of a nonenhanced exam, no other focal abnormalities of the liver, spleen, pancreas, kidneys, or adrenal glands.   Porcelain gallbladder, difficult to exclude a coexistent large calcified gallstone internally.  IVC filter.  Scattered atherosclerotic calcifications.  Umbilical hernia containing fat with an additional infraumbilical hernia containing fat and a small bowel loop with associated mesenteric edema.  Mild dilatation of proximal small bowel loops with decompression of distal small bowel loops likely representing a degree of a small bowel obstruction at the hernia.  Colon shows mild diffuse questionable bowel wall thickening which may be related to a history of ulcerative colitis.  No pericolic inflammatory changes.  Stomach unremarkable.  Normal appearing bladder and ureters.  Uterus surgically absent with nonvisualization of ovaries.  Question normal appendix.  No mass, adenopathy, or acute bone lesion.  Diffuse osseous demineralization identified with evidence of L4-L5 fusion  IMPRESSION: Infraumbilical hernia containing a small bowel loop with likely a component of small bowel obstruction and mild bowel wall edema at the site.  Porcelain gallbladder, cannot exclude coexistent internal gallstone.  Small pericardial effusion.  Tiny umbilical hernia containing fat.  Mild diffuse thickening of the colon is questioned, which may be related to history of ulcerative colitis.   Electronically Signed   By: Ulyses Southward M.D.   On: 03/22/2013 18:17   Dg Chest 2 View 03/22/2013   CLINICAL DATA:  Nausea and vomiting  EXAM: CHEST  2 VIEW  COMPARISON:  None.  FINDINGS: The cardiac shadow is within normal limits. The lungs are well aerated bilaterally. No focal infiltrate or effusion is seen. A right chest wall port is noted. No acute bony abnormality is noted.  IMPRESSION: No acute abnormality seen.   Electronically Signed   By: Alcide Clever M.D.   On: 03/22/2013 16:31      Laray Anger, DO 03/25/13 0133

## 2013-03-22 NOTE — Consult Note (Signed)
Reason for Consult:nausea and vomting Referring Physician: Dr Laban Emperor  [name] is an 79 y.o. female.  HPI: Pt here with increased nausea and vomiting over the last week.  She has UC and a h/o CMV colitis.  She reports minimal nausea at baseline, but worsens with PO intake.  Pt has a calcified gallbladder as well.    Past Medical History  Diagnosis Date  . Anemia   . Hyperlipemia   . Hypertension   . Thyroid disease     hypothyroidism  . Anxiety   . Paroxysmal atrial fibrillation   . Vitamin B 12 deficiency   . CHF (congestive heart failure)   . TIA (transient ischemic attack)     4-09  . Microscopic hematuria     benign microscopic hematuria, worked up b Dr Alexis Frock  . Gall stone     porcelain gall bladder with single stone  per CT 02-2009 Dr Birdie Sons  . Osteoporosis     DEXA on 04-30-10  . Myasthenia gravis     sees Dr. Terrace Arabia   . Ulcerative colitis   . DVT of leg (deep venous thrombosis)     left leg   . CMV colitis     Past Surgical History  Procedure Laterality Date  . Cardiac catheterization      04/1999  . Abdominal hysterectomy      with oophorectomy  . Colonoscopy  1999    normal   . Colonoscopy N/A 12/10/2012    Procedure: COLONOSCOPY;  Surgeon: Iva Boop, MD;  Location: [facility];  Service: Endoscopy;  Laterality: N/A;  . Flexible sigmoidoscopy N/A 01/26/2013    Procedure: FLEXIBLE SIGMOIDOSCOPY;  Surgeon: Iva Boop, MD;  Location: [facility];  Service: Endoscopy;  Laterality: N/A;    Family History  Problem Relation Age of Onset  . Heart disease Father     Social History:  reports that she has never smoked. She has never used smokeless tobacco. She reports that she does not drink alcohol or use illicit drugs.  Allergies:  Allergies  Allergen Reactions  . Zofran [Ondansetron Hcl]     hypotension  . Furosemide Rash    REACTION: unspecified  . Ivp Dye [Iodinated Diagnostic Agents] Rash  . Sulfamethoxazole Rash     REACTION: unspecified    Medications: I have reviewed the patient's current medications.  Results for orders placed during the hospital encounter of 03/22/13 (from the past 48 hour(s))  CBC WITH DIFFERENTIAL     Status: Abnormal   Collection Time    03/22/13  2:50 PM      Result Value Range   WBC 2.8 (*) 4.0 - 10.5 K/uL   RBC 3.42 (*) 3.87 - 5.11 MIL/uL   Hemoglobin 10.4 (*) 12.0 - 15.0 g/dL   HCT 16.1 (*) 09.6 - 04.5 %   MCV 89.2  78.0 - 100.0 fL   MCH 30.4  26.0 - 34.0 pg   MCHC 34.1  30.0 - 36.0 g/dL   RDW 40.9 (*) 81.1 - 91.4 %   Platelets 257  150 - 400 K/uL   Neutrophils Relative % 28 (*) 43 - 77 %   Lymphocytes Relative 24  12 - 46 %   Monocytes Relative 47 (*) 3 - 12 %   Eosinophils Relative 1  0 - 5 %   Basophils Relative 0  0 - 1 %   Neutro Abs 0.8 (*) 1.7 - 7.7 K/uL   Lymphs Abs 0.7  0.7 - 4.0 K/uL   Monocytes Absolute 1.3 (*) 0.1 - 1.0 K/uL   Eosinophils Absolute 0.0  0.0 - 0.7 K/uL   Basophils Absolute 0.0  0.0 - 0.1 K/uL   RBC Morphology POLYCHROMASIA PRESENT     Comment: BURR CELLS  COMPREHENSIVE METABOLIC PANEL     Status: Abnormal   Collection Time    03/22/13  2:50 PM      Result Value Range   Sodium 136  135 - 145 mEq/L   Potassium 3.8  3.5 - 5.1 mEq/L   Chloride 100  96 - 112 mEq/L   CO2 16 (*) 19 - 32 mEq/L   Glucose, Bld 157 (*) 70 - 99 mg/dL   BUN 24 (*) 6 - 23 mg/dL   Creatinine, Ser 1.61 (*) 0.50 - 1.10 mg/dL   Calcium 9.5  8.4 - 09.6 mg/dL   Total Protein 5.8 (*) 6.0 - 8.3 g/dL   Albumin 2.8 (*) 3.5 - 5.2 g/dL   AST 19  0 - 37 U/L   ALT 9  0 - 35 U/L   Alkaline Phosphatase 65  39 - 117 U/L   Total Bilirubin 0.8  0.3 - 1.2 mg/dL   GFR calc non Af Amer 38 (*) >90 mL/min   GFR calc Af Amer 44 (*) >90 mL/min   Comment: (NOTE)     The eGFR has been calculated using the CKD EPI equation.     This calculation has not been validated in all clinical situations.     eGFR's persistently <90 mL/min signify possible Chronic Kidney     Disease.   LIPASE, BLOOD     Status: None   Collection Time    03/22/13  2:50 PM      Result Value Range   Lipase 15  11 - 59 U/L  TROPONIN I     Status: Abnormal   Collection Time    03/22/13  2:50 PM      Result Value Range   Troponin I 0.87 (*) <0.30 ng/mL   Comment:            Due to the release kinetics of cTnI,     a negative result within the first hours     of the onset of symptoms does not rule out     myocardial infarction with certainty.     If myocardial infarction is still suspected,     repeat the test at appropriate intervals.     CRITICAL RESULT CALLED TO, READ BACK BY AND VERIFIED WITH:     KESSLER,R. AT 1633 ON 10.31.14 BY LOVE,T.  URINALYSIS W MICROSCOPIC + REFLEX CULTURE     Status: Abnormal   Collection Time    03/22/13  3:56 PM      Result Value Range   Color, Urine YELLOW  YELLOW   APPearance CLEAR  CLEAR   Specific Gravity, Urine 1.020  1.005 - 1.030   pH 5.5  5.0 - 8.0   Glucose, UA NEGATIVE  NEGATIVE mg/dL   Hgb urine dipstick NEGATIVE  NEGATIVE   Bilirubin Urine NEGATIVE  NEGATIVE   Ketones, ur 15 (*) NEGATIVE mg/dL   Protein, ur NEGATIVE  NEGATIVE mg/dL   Urobilinogen, UA 1.0  0.0 - 1.0 mg/dL   Nitrite NEGATIVE  NEGATIVE   Leukocytes, UA NEGATIVE  NEGATIVE   Urine-Other       Value: NO FORMED ELEMENTS SEEN ON URINE MICROSCOPIC EXAMINATION  LACTIC ACID, PLASMA  Status: None   Collection Time    03/22/13  4:28 PM      Result Value Range   Lactic Acid, Venous 1.1  0.5 - 2.2 mmol/L    Ct Abdomen Pelvis Wo Contrast  03/22/2013   CLINICAL DATA:  Mid abdominal pain, nausea, vomiting, history hyperlipidemia, hypertension, CHF, myasthenia gravis, ulcerative colitis, DVT  EXAM: CT ABDOMEN AND PELVIS WITHOUT CONTRAST  TECHNIQUE: Multidetector CT imaging of the abdomen and pelvis was performed following the standard protocol without intravenous contrast. Sagittal and coronal MPR images reconstructed from axial data set. Patient drank dilute oral contrast for  exam  COMPARISON:  04/07/2009  FINDINGS: Dense mitral annular calcification.  Chronic interstitial changes/fibrosis at lung bases.  Small pericardial effusion.  Small cyst at inferior pole left kidney 3.3 x 3.5 cm image 38.  Within limits of a nonenhanced exam, no other focal abnormalities of the liver, spleen, pancreas, kidneys, or adrenal glands.  Porcelain gallbladder, difficult to exclude a coexistent large calcified gallstone internally.  IVC filter.  Scattered atherosclerotic calcifications.  Umbilical hernia containing fat with an additional infraumbilical hernia containing fat and a small bowel loop with associated mesenteric edema.  Mild dilatation of proximal small bowel loops with decompression of distal small bowel loops likely representing a degree of a small bowel obstruction at the hernia.  Colon shows mild diffuse questionable bowel wall thickening which may be related to a history of ulcerative colitis.  No pericolic inflammatory changes.  Stomach unremarkable.  Normal appearing bladder and ureters.  Uterus surgically absent with nonvisualization of ovaries.  Question normal appendix.  No mass, adenopathy, or acute bone lesion.  Diffuse osseous demineralization identified with evidence of L4-L5 fusion  IMPRESSION: Infraumbilical hernia containing a small bowel loop with likely a component of small bowel obstruction and mild bowel wall edema at the site.  Porcelain gallbladder, cannot exclude coexistent internal gallstone.  Small pericardial effusion.  Tiny umbilical hernia containing fat.  Mild diffuse thickening of the colon is questioned, which may be related to history of ulcerative colitis.   Electronically Signed   By: Ulyses Southward M.D.   On: 03/22/2013 18:17   Dg Chest 2 View  03/22/2013   CLINICAL DATA:  Nausea and vomiting  EXAM: CHEST  2 VIEW  COMPARISON:  None.  FINDINGS: The cardiac shadow is within normal limits. The lungs are well aerated bilaterally. No focal infiltrate or effusion  is seen. A right chest wall port is noted. No acute bony abnormality is noted.  IMPRESSION: No acute abnormality seen.   Electronically Signed   By: Alcide Clever M.D.   On: 03/22/2013 16:31    Review of Systems  Constitutional: Negative for fever and chills.  Eyes: Negative for blurred vision.  Respiratory: Negative for cough and shortness of breath.   Cardiovascular: Negative for chest pain.  Gastrointestinal: Positive for nausea, vomiting and diarrhea. Negative for constipation.  Genitourinary: Negative for dysuria, urgency and frequency.   Blood pressure 104/59, pulse 85, temperature 98.3 F (36.8 C), temperature source Oral, resp. rate 17, SpO2 100.00%. Physical Exam  Constitutional: She appears well-developed and well-nourished.  HENT:  Head: Normocephalic and atraumatic.  Eyes: Conjunctivae are normal. Pupils are equal, round, and reactive to light.  Neck: Normal range of motion.  Cardiovascular:  Tachycardic, irregular rhythm   Respiratory: Effort normal. No respiratory distress.  GI: Soft. She exhibits no distension. There is no tenderness. There is no rebound and no guarding.  Reducible umbilical hernia, incarcerated incisional hernia.  No tenderness to palpation  Musculoskeletal: Normal range of motion.  Neurological: She is alert.  Skin: Skin is warm and dry.    Assessment/Plan: [name] is a 79 y.o. F with multiple medical problems and multiple reasons for her nausea.  The CT scan does show a loop of small bowel within her incarcerated hernia.  This appears mildly dilated, but there is no evidence of inflammation or tenderness on exam.  This could be the cause of her symptoms.  I have recommended that we monitor her closely for now while her medical team works on ruling out possible other causes.  We will make her NPO.  If she continues to vomit, we will place an NG.  Prithvi Kooi C. 03/22/2013, 7:45 PM

## 2013-03-22 NOTE — ED Notes (Signed)
Pt had first episode of vomiting on Sunday. Then during the week pt has been nauseated and vomiting. Pt states that she has abd pain that started today.

## 2013-03-22 NOTE — ED Notes (Signed)
Pt has history of diarrhea over the past sereval months due to CMV and taking off a medication to help reduce the ulcers and diarrhea.

## 2013-03-22 NOTE — Telephone Encounter (Signed)
Rn called to advise that the patient is still sick and she wants to know if the doctor has seen the labs. Advised her that I will have to send him a note and call him with the results as he is out of the office.  After reading the previous phone paged the doctor and advised him of the lab results and the contact info and he advised he will contact the patient daughter and the nurse for Advance.

## 2013-03-22 NOTE — Progress Notes (Addendum)
PHARMACIST - PHYSICIAN ORDER COMMUNICATION  TOPIC: P&T Medication Policy on Herbal Medications  DISCUSSION:  This patient's order for Glucosamine-Chondroitin  has been noted.  This product is classified as an "herbal" or natural product.  The Pharmacy and Therapeutics Committee does not permit the use of "herbal" or natural products of this type within [facility].  This policy was adopted due to a lack of definitive safety studies, lack of FDA approval, nonstandard manufacturing practices, and the potential risk of unknown drug-drug interactions with inpatient medications.   ACTION TAKEN: The order for Glucosamine-Chondroitin has been discontinued.  At discharge, please determine whether or not  the order for Glucosamine-Chondroitin should be restarted.  Polo Riley R.Ph. 03/22/2013 9:37 PM

## 2013-03-22 NOTE — Telephone Encounter (Signed)
I forwarded the CBC to Samule Ohm, she will give to Dr. Orvan Falconer. The daughter's name is Milinda Pointer and she called from work yesterday # [phone]. Wendall Mola CMA

## 2013-03-22 NOTE — H&P (Signed)
[facility] History and Physical  Tijah Hane ZOX:[id number] DOB: 05/19/1925 DOA: 03/22/2013  Referring physician: ED PCP: Nelwyn Salisbury, MD  Chief Complaint: N/V  HPI: [name] is a 79 y.o. female who presents to the ED with increased N/V over the past week.  She has a history of UC and chronic problems with CMV colitis.  Minimal nausea at baseline that significantly worsens with PO intake.  Last night she was throwing up even water.  Vomiting is bilious but non bloody.  Per daughter it is difficult to get the patient to admit to any complaints.  Numerous problems were identified in the ED requiring admission (see A/P for the full list).  Review of Systems: Positive for an episode of chest pain with swallowing yesterday, negative for CP, SOB, palpitations.  12 systems reviewed and otherwise negative.  Past Medical History  Diagnosis Date  . Anemia   . Hyperlipemia   . Hypertension   . Thyroid disease     hypothyroidism  . Anxiety   . Paroxysmal atrial fibrillation   . Vitamin B 12 deficiency   . CHF (congestive heart failure)   . TIA (transient ischemic attack)     4-09  . Microscopic hematuria     benign microscopic hematuria, worked up b Dr Alexis Frock  . Gall stone     porcelain gall bladder with single stone  per CT 02-2009 Dr Birdie Sons  . Osteoporosis     DEXA on 04-30-10  . Myasthenia gravis     sees Dr. Terrace Arabia   . Ulcerative colitis   . DVT of leg (deep venous thrombosis)     left leg   . CMV colitis    Past Surgical History  Procedure Laterality Date  . Cardiac catheterization      04/1999  . Abdominal hysterectomy      with oophorectomy  . Colonoscopy  1999    normal   . Colonoscopy N/A 12/10/2012    Procedure: COLONOSCOPY;  Surgeon: Iva Boop, MD;  Location: [facility];  Service: Endoscopy;  Laterality: N/A;  . Flexible sigmoidoscopy N/A 01/26/2013    Procedure: FLEXIBLE SIGMOIDOSCOPY;  Surgeon: Iva Boop, MD;  Location: [facility];  Service: Endoscopy;  Laterality: N/A;   Social History:  reports that she has never smoked. She has never used smokeless tobacco. She reports that she does not drink alcohol or use illicit drugs.   Allergies  Allergen Reactions  . Zofran [Ondansetron Hcl]     hypotension  . Furosemide Rash    REACTION: unspecified  . Ivp Dye [Iodinated Diagnostic Agents] Rash  . Sulfamethoxazole Rash    REACTION: unspecified    Family History  Problem Relation Age of Onset  . Heart disease Father     Prior to Admission medications   Medication Sig Start Date End Date Taking? Authorizing Provider  ALPRAZolam (XANAX) 0.25 MG tablet Take 1 tablet (0.25 mg total) by mouth 2 (two) times daily as needed for anxiety. 02/12/13  Yes Daniel J Angiulli, PA-C  ALPRAZolam Prudy Feeler) 1 MG tablet Take 1 mg by mouth at bedtime as needed for sleep.   Yes Historical Provider, MD  atorvastatin (LIPITOR) 20 MG tablet Take 1 tablet (20 mg total) by mouth daily with supper. 02/12/13  Yes Daniel J Angiulli, PA-C  buPROPion (WELLBUTRIN XL) 150 MG 24 hr tablet Take 1 tablet (150 mg total) by mouth every morning. 02/12/13 02/12/14 Yes Daniel J Angiulli, PA-C  co-enzyme Q-10 50 MG capsule  Take 1 capsule (50 mg total) by mouth at bedtime. 02/12/13  Yes Daniel J Angiulli, PA-C  cyanocobalamin (,VITAMIN B-12,) 1000 MCG/ML injection Inject 1,000 mcg into the muscle every 30 (thirty) days.   Yes Historical Provider, MD  feeding supplement (ENSURE COMPLETE) LIQD Take 237 mLs by mouth daily. 12/12/12  Yes Laveda [facility], MD  fish oil-omega-3 fatty acids 1000 MG capsule Take 1 capsule (1 g total) by mouth daily. 02/12/13  Yes Daniel J Angiulli, PA-C  glucosamine-chondroitin 500-400 MG tablet Take 1-2 tablets by mouth daily. Alternates taking 1 and 2 capsules.   Yes Historical Provider, MD  irbesartan-hydrochlorothiazide (AVALIDE) 300-12.5 MG per tablet Take 1 tablet by mouth daily. 02/28/13  Yes Nelwyn Salisbury, MD  levothyroxine  (SYNTHROID, LEVOTHROID) 125 MCG tablet Take 1 tablet (125 mcg total) by mouth daily. 02/12/13  Yes Daniel J Angiulli, PA-C  loperamide (IMODIUM) 2 MG capsule Take 1 capsule (2 mg total) by mouth 3 (three) times daily. 02/12/13  Yes Daniel J Angiulli, PA-C  Multiple Vitamin (MULTIVITAMIN WITH MINERALS) TABS tablet Take 1 tablet by mouth daily.   Yes Historical Provider, MD  mycophenolate (CELLCEPT) 500 MG tablet Take 2 tablets (1,000 mg total) by mouth 2 (two) times daily. 02/12/13  Yes Daniel J Angiulli, PA-C  potassium chloride (K-DUR) 10 MEQ tablet Take 20 mEq by mouth 3 (three) times daily.  02/12/13  Yes Daniel J Angiulli, PA-C  Probiotic Product (PROBIOTIC DAILY PO) Take 1 tablet by mouth daily.    Yes Historical Provider, MD  ranitidine (ZANTAC) 150 MG tablet Take 150 mg by mouth daily.   Yes Historical Provider, MD  torsemide (DEMADEX) 10 MG tablet Take 10 mg by mouth 2 (two) times daily with a meal.   Yes Historical Provider, MD  vitamin C (ASCORBIC ACID) 250 MG tablet Take 250 mg by mouth daily.   Yes Historical Provider, MD   Physical Exam: Filed Vitals:   03/22/13 1827  BP: 104/59  Pulse: 85  Temp: 98.3 F (36.8 C)  Resp: 17    General:  NAD, resting comfortably in bed Eyes: PEERLA EOMI ENT: mucous membranes moist Neck: supple w/o JVD Cardiovascular: irr, irr, tachycardic Respiratory: CTA B Abdomen: soft, nt, nd, reducible umbilical hernia, incarcerated incisional hernia, bs+ Skin: no rash nor lesion Musculoskeletal: MAE, full ROM all 4 extremities Psychiatric: normal tone and affect Neurologic: AAOx3, grossly non-focal   Labs on Admission:  Basic Metabolic Panel:  Recent Labs Lab 03/22/13 1450  NA 136  K 3.8  CL 100  CO2 16*  GLUCOSE 157*  BUN 24*  CREATININE 1.25*  CALCIUM 9.5   Liver Function Tests:  Recent Labs Lab 03/22/13 1450  AST 19  ALT 9  ALKPHOS 65  BILITOT 0.8  PROT 5.8*  ALBUMIN 2.8*    Recent Labs Lab 03/22/13 1450  LIPASE 15   No  results found for this basename: AMMONIA,  in the last 168 hours CBC:  Recent Labs Lab 03/22/13 1450  WBC 2.8*  NEUTROABS 0.8*  HGB 10.4*  HCT 30.5*  MCV 89.2  PLT 257   Cardiac Enzymes:  Recent Labs Lab 03/22/13 1450 03/22/13 1909  TROPONINI 0.87* 1.87*    BNP (last 3 results)  Recent Labs  12/08/12 0026  PROBNP 465.5*   CBG: No results found for this basename: GLUCAP,  in the last 168 hours  Radiological Exams on Admission: Ct Abdomen Pelvis Wo Contrast  03/22/2013   CLINICAL DATA:  Mid abdominal pain, nausea, vomiting,  history hyperlipidemia, hypertension, CHF, myasthenia gravis, ulcerative colitis, DVT  EXAM: CT ABDOMEN AND PELVIS WITHOUT CONTRAST  TECHNIQUE: Multidetector CT imaging of the abdomen and pelvis was performed following the standard protocol without intravenous contrast. Sagittal and coronal MPR images reconstructed from axial data set. Patient drank dilute oral contrast for exam  COMPARISON:  04/07/2009  FINDINGS: Dense mitral annular calcification.  Chronic interstitial changes/fibrosis at lung bases.  Small pericardial effusion.  Small cyst at inferior pole left kidney 3.3 x 3.5 cm image 38.  Within limits of a nonenhanced exam, no other focal abnormalities of the liver, spleen, pancreas, kidneys, or adrenal glands.  Porcelain gallbladder, difficult to exclude a coexistent large calcified gallstone internally.  IVC filter.  Scattered atherosclerotic calcifications.  Umbilical hernia containing fat with an additional infraumbilical hernia containing fat and a small bowel loop with associated mesenteric edema.  Mild dilatation of proximal small bowel loops with decompression of distal small bowel loops likely representing a degree of a small bowel obstruction at the hernia.  Colon shows mild diffuse questionable bowel wall thickening which may be related to a history of ulcerative colitis.  No pericolic inflammatory changes.  Stomach unremarkable.  Normal  appearing bladder and ureters.  Uterus surgically absent with nonvisualization of ovaries.  Question normal appendix.  No mass, adenopathy, or acute bone lesion.  Diffuse osseous demineralization identified with evidence of L4-L5 fusion  IMPRESSION: Infraumbilical hernia containing a small bowel loop with likely a component of small bowel obstruction and mild bowel wall edema at the site.  Porcelain gallbladder, cannot exclude coexistent internal gallstone.  Small pericardial effusion.  Tiny umbilical hernia containing fat.  Mild diffuse thickening of the colon is questioned, which may be related to history of ulcerative colitis.   Electronically Signed   By: Ulyses Southward M.D.   On: 03/22/2013 18:17   Dg Chest 2 View  03/22/2013   CLINICAL DATA:  Nausea and vomiting  EXAM: CHEST  2 VIEW  COMPARISON:  None.  FINDINGS: The cardiac shadow is within normal limits. The lungs are well aerated bilaterally. No focal infiltrate or effusion is seen. A right chest wall port is noted. No acute bony abnormality is noted.  IMPRESSION: No acute abnormality seen.   Electronically Signed   By: Alcide Clever M.D.   On: 03/22/2013 16:31    EKG: Independently reviewed.  Assessment/Plan Principal Problem:   N&V (nausea and vomiting) Active Problems:   Acute renal failure   CMV colitis   Atrial fibrillation with RVR   Elevated troponin   Porcelain gallbladder   Incarcerated incisional hernia   1. N/V - could be due to NSTEMI, SBO from incarcerated incisional hernia, gallbladder, and/or CMV.  On phenerganPRN and keeping patient NPO. 2. AKI - mild AKI secondary to dehydration most likely, IVF at 125 cc/hr and monitoring intake and output, repeat in AM. 3. A.Fib with RVR with h/o paroxysmal A.Fib- putting patient on cardizem gtt for rate control, do not wish to start anticoagulation at this point due to long history of UC, last time they tried to anticoagulate the patient she developed severe LGIB and per daughter  almost "bled out". 4. Elevated troponin - trending up, concerning for NSTEMI, continue to repeat troponins, consult to cardiology pending, unable to start heparin gtt due to risk of LGIB.  Patient having no chest pain at this time. 5. CMV colitis - ordering CMV quantitative PCR 6. Incarcerated incisional hernia - was concerning for SBO, surgery has evaluated patient, no  NG tube at this point, monitor closely, NPO, IVF. 7. Porcelain gallbladder - unlikely to be source of patients symptoms per surgery, chronic, does have risk of conversion to malignancy but at this point is low on list of priorities as discussed with family.    Code Status: Full Code (must indicate code status--if unknown or must be presumed, indicate so) Family Communication: Daughter at bedside (indicate person spoken with, if applicable, with phone number if by telephone) Disposition Plan: Admit to inpatient (indicate anticipated LOS)  Time spent: 70 min  GARDNER, JARED M. [facility] Pager [phone]  If 7PM-7AM, please contact night-coverage [email or web address] Password [email or web address] 03/22/2013, 8:12 PM

## 2013-03-23 DIAGNOSIS — I369 Nonrheumatic tricuspid valve disorder, unspecified: Secondary | ICD-10-CM

## 2013-03-23 DIAGNOSIS — D638 Anemia in other chronic diseases classified elsewhere: Secondary | ICD-10-CM

## 2013-03-23 DIAGNOSIS — J9601 Acute respiratory failure with hypoxia: Secondary | ICD-10-CM | POA: Diagnosis present

## 2013-03-23 DIAGNOSIS — K430 Incisional hernia with obstruction, without gangrene: Secondary | ICD-10-CM

## 2013-03-23 DIAGNOSIS — N179 Acute kidney failure, unspecified: Secondary | ICD-10-CM

## 2013-03-23 LAB — BASIC METABOLIC PANEL
Chloride: 104 mEq/L (ref 96–112)
Creatinine, Ser: 1.15 mg/dL — ABNORMAL HIGH (ref 0.50–1.10)
GFR calc Af Amer: 48 mL/min — ABNORMAL LOW (ref >90)
GFR calc non Af Amer: 42 mL/min — ABNORMAL LOW (ref >90)
Potassium: 3.7 mEq/L (ref 3.5–5.1)

## 2013-03-23 LAB — CBC
MCHC: 33.6 g/dL (ref 30.0–36.0)
MCV: 90.6 fL (ref 78.0–100.0)
Platelets: 258 10*3/uL (ref 150–400)
RDW: 16.3 % — ABNORMAL HIGH (ref 11.5–15.5)
WBC: 4.6 10*3/uL (ref 4.0–10.5)

## 2013-03-23 LAB — PROCALCITONIN: Procalcitonin: <0.1 ng/mL

## 2013-03-23 LAB — TROPONIN I: Troponin I: 0.84 ng/mL (ref <0.30)

## 2013-03-23 MED ORDER — MENTHOL 3 MG MT LOZG
1.0000 | LOZENGE | OROMUCOSAL | Status: DC | PRN
  Administered 2013-03-23 (×2): 3 mg via ORAL
  Filled 2013-03-23: qty 9

## 2013-03-23 MED ORDER — PROMETHAZINE HCL 25 MG/ML IJ SOLN
12.5000 mg | INTRAMUSCULAR | Status: DC | PRN
  Administered 2013-03-23 (×2): 12.5 mg via INTRAVENOUS
  Filled 2013-03-23 (×2): qty 1

## 2013-03-23 MED ORDER — PANTOPRAZOLE SODIUM 40 MG IV SOLR
40.0000 mg | Freq: Two times a day (BID) | INTRAVENOUS | Status: DC
  Administered 2013-03-23 – 2013-03-28 (×11): 40 mg via INTRAVENOUS
  Filled 2013-03-23 (×13): qty 40

## 2013-03-23 MED ORDER — SODIUM CHLORIDE 0.9 % IV BOLUS (SEPSIS)
500.0000 mL | Freq: Once | INTRAVENOUS | Status: AC
  Administered 2013-03-23: 500 mL via INTRAVENOUS

## 2013-03-23 MED ORDER — AMIODARONE HCL IN DEXTROSE 360-4.14 MG/200ML-% IV SOLN
30.0000 mg/h | INTRAVENOUS | Status: DC
  Administered 2013-03-23 – 2013-03-24 (×3): 30 mg/h via INTRAVENOUS
  Administered 2013-03-25: 15:00:00 via INTRAVENOUS
  Administered 2013-03-25 – 2013-03-27 (×4): 30 mg/h via INTRAVENOUS
  Filled 2013-03-23 (×9): qty 200

## 2013-03-23 MED ORDER — LORAZEPAM 2 MG/ML IJ SOLN
1.0000 mg | Freq: Every evening | INTRAMUSCULAR | Status: DC | PRN
  Administered 2013-03-24: 1 mg via INTRAVENOUS
  Filled 2013-03-23: qty 1

## 2013-03-23 MED ORDER — ONDANSETRON HCL 4 MG/2ML IJ SOLN: 4.0000 mg | Freq: Four times a day (QID) | INTRAMUSCULAR | Status: DC | PRN

## 2013-03-23 MED ORDER — LIP MEDEX EX OINT
TOPICAL_OINTMENT | CUTANEOUS | Status: DC | PRN
  Filled 2013-03-23: qty 7

## 2013-03-23 MED ORDER — AMIODARONE HCL IN DEXTROSE 360-4.14 MG/200ML-% IV SOLN
60.0000 mg/h | INTRAVENOUS | Status: AC
  Administered 2013-03-23 (×2): 60 mg/h via INTRAVENOUS
  Filled 2013-03-23 (×2): qty 200

## 2013-03-23 MED ORDER — PROMETHAZINE HCL 25 MG/ML IJ SOLN
6.2500 mg | INTRAMUSCULAR | Status: DC | PRN
  Administered 2013-03-23: 6.25 mg via INTRAVENOUS
  Filled 2013-03-23: qty 1

## 2013-03-23 MED ORDER — MORPHINE SULFATE 2 MG/ML IJ SOLN
1.0000 mg | INTRAMUSCULAR | Status: DC | PRN
  Administered 2013-03-23: 1 mg via INTRAVENOUS
  Filled 2013-03-23: qty 1

## 2013-03-23 MED ORDER — PROCHLORPERAZINE EDISYLATE 5 MG/ML IJ SOLN
10.0000 mg | Freq: Four times a day (QID) | INTRAMUSCULAR | Status: DC | PRN
  Administered 2013-03-23 – 2013-03-28 (×2): 10 mg via INTRAVENOUS
  Filled 2013-03-23 (×2): qty 2

## 2013-03-23 MED ORDER — AMIODARONE LOAD VIA INFUSION
150.0000 mg | Freq: Once | INTRAVENOUS | Status: AC
  Administered 2013-03-23: 150 mg via INTRAVENOUS
  Filled 2013-03-23: qty 83.34

## 2013-03-23 NOTE — Progress Notes (Signed)
[facility] PROGRESS NOTE  [name] ZOX:[id number] DOB: 02-02-1925 DOA: 03/22/2013 PCP: Nelwyn Salisbury, MD  Brief narrative: 79 year old female with past medical history of ulcerative colitis, CMV colitis, hypertension, hypothyroidism, anxiety who presented to [facility] ED 03/22/2013 with complaints of intractable nausea and vomiting associated with poor oral intake over past 1 week prior to this admission. In ED, vital signs were as follows: BP 70/35, HR 39-144 and T max 98.3 F. Oxygen saturation was 84% on room air. BP has improved with initial IV fluids (1.5 L NS) to 143/80 but then bounced back to 88/51.Patient however did not require pressor support. In addition, CT abdomen revealed incarcerated hernia adn loop of small bowel within the hernia for which reason surgery was consulted. Hospital course is complicated with additional problem of A fib with RVR and NSTEMI. Cardiology is assisting the management.  Assessment/Plan:  Principal Problem:   N&V (nausea and vomiting) in the setting of incarcerated hernia and small bowel within the hernia - based on CT scan there is a loop of small bowel within the incarcerated hernia which could contribute to N/V and abdominal discomfort - now has NG tube in place per surgery recommendations  - BP has improved now after total of 2.5 L normal saline. Continue antiemetics as needed - appreciate surgery following Active Problems:   Atrial fibrillation with RVR - appreciate cardiology following - pt was started on amiodarone an digoxin per cardio recomendations   NSTEMI (non-ST elevated myocardial infarction) - cardiac enzymes are trending down, 1.23 --> 0.62 - follow up results of 2 D ECHO - cardio is following   Acute hypoxic respiratory failure - unclear etiology - CXR did not reveal acute cardiopulmonary porcess - resp status now stable   HYPOTHYROIDISM - continue synthroid once PO intake resumed   ANXIETY - ativan ordered IV PRN at  bedtime   Acute renal failure - likely due to GI losses, dehydration, pre-renal etiology - will check BMP in am    Incarcerated incisional hernia - surgery is following   Moderate protein-calorie malnutrition - now has NG tube - may improve once pt resumes oral intake   Anemia of chronic disease - secondary to history of thyroid disease - hemoglobin 10.4 on admission and then 9.7   Code Status: full code Family Communication: no family at the bedside Disposition Plan: remains inpatient   Manson Passey, MD  [facility] Pager [phone]  If 7PM-7AM, please contact night-coverage [email or web address] Password [email or web address] 03/23/2013, 7:11 AM   LOS: 1 day   Consultants:  Cardiology  Surgery  Procedures:  NG tube placement 03/23/2013  Antibiotics:  None   HPI/Subjective: Had 1 episode of vomiting this am. No overnight events.  Objective: Filed Vitals:   03/23/13 0400 03/23/13 0443 03/23/13 0500 03/23/13 0532  BP:  107/63 136/91 87/50  Pulse: 77  53 129  Temp: 97.8 F (36.6 C)     TempSrc: Oral     Resp: 27 20 22 19   Height:      Weight: 73.6 kg (162 lb 4.1 oz)     SpO2: 100%  95% 99%    Intake/Output Summary (Last 24 hours) at 03/23/13 0711 Last data filed at 03/23/13 0500  Gross per 24 hour  Intake 2618.75 ml  Output    675 ml  Net 1943.75 ml    Exam:   General:  Pt is alert, follows commands appropriately, not in acute distress  Cardiovascular: tachycardic, S1/S2 appreciated  Respiratory: Clear to auscultation bilaterally,  no wheezing, no crackles, no rhonchi  Abdomen: Soft, non tender, non distended, bowel sounds present, no guarding  Extremities: No edema, pulses DP and PT palpable bilaterally  Neuro: Grossly nonfocal  Data Reviewed: Basic Metabolic Panel:  Recent Labs Lab 03/22/13 1450 03/23/13 0245  NA 136 137  K 3.8 3.7  CL 100 104  CO2 16* 16*  GLUCOSE 157* 127*  BUN 24* 21  CREATININE 1.25* 1.15*  CALCIUM 9.5 8.4   Liver  Function Tests:  Recent Labs Lab 03/22/13 1450  AST 19  ALT 9  ALKPHOS 65  BILITOT 0.8  PROT 5.8*  ALBUMIN 2.8*    Recent Labs Lab 03/22/13 1450  LIPASE 15   No results found for this basename: AMMONIA,  in the last 168 hours CBC:  Recent Labs Lab 03/22/13 1450 03/23/13 0245  WBC 2.8* 4.6  NEUTROABS 0.8*  --   HGB 10.4* 9.7*  HCT 30.5* 28.9*  MCV 89.2 90.6  PLT 257 258   Cardiac Enzymes:  Recent Labs Lab 03/22/13 1450 03/22/13 1909 03/23/13 0245  TROPONINI 0.87* 1.87* 1.23*   BNP: No components found with this basename: POCBNP,  CBG: No results found for this basename: GLUCAP,  in the last 168 hours  MRSA PCR SCREENING     Status: None   Collection Time    03/22/13  8:51 PM      Result Value Range Status   MRSA by PCR NEGATIVE  NEGATIVE Final     Studies: Ct Abdomen Pelvis Wo Contrast 03/22/2013   IMPRESSION: Infraumbilical hernia containing a small bowel loop with likely a component of small bowel obstruction and mild bowel wall edema at the site.  Porcelain gallbladder, cannot exclude coexistent internal gallstone.  Small pericardial effusion.  Tiny umbilical hernia containing fat.  Mild diffuse thickening of the colon is questioned, which may be related to history of ulcerative colitis.     Dg Chest 2 View 03/22/2013    IMPRESSION: No acute abnormality seen.      Scheduled Meds: . atorvastatin  20 mg Oral Q supper  . buPROPion  150 mg Oral Daily  . digoxin  0.25 mg Intravenous Q6H  .  digoxin  0.25 mg Intravenous Daily  . levothyroxine  125 mcg Oral QAC breakfast  . multivitamin   1 tablet Oral Daily  . mycophenolate  1,000 mg Oral BID   Continuous Infusions: . sodium chloride 125 mL/hr at 03/22/13 2103

## 2013-03-23 NOTE — Progress Notes (Signed)
Subjective:  She feels better today and is not short of breath. No complaints of chest pain. Low-level elevations of troponin which are multi-factorial.  Objective:  Vital Signs in the last 24 hours: BP 87/50  Pulse 129  Temp(Src) 97.8 F (36.6 C) (Oral)  Resp 19  Ht 5\' 5"  (1.651 m)  Wt 73.6 kg (162 lb 4.1 oz)  BMI 27 kg/m2  SpO2 99%  Physical Exam: Pleasant white female who appears younger than stated age Lungs:  Clear Cardiac:  Rapid irregular rhythm, normal S1 and S2, no S3 Abdomen: Mildly distended, mildly tender  Extremities:  No edema present  Intake/Output from previous day: 10/31 0701 - 11/01 0700 In: 2618.8 [I.V.:1118.8; IV Piggyback:1500] Out: 675 [Urine:575; Emesis/NG output:100]  Weight Filed Weights   03/22/13 2120 03/23/13 0400  Weight: 70.8 kg (156 lb 1.4 oz) 73.6 kg (162 lb 4.1 oz)    Lab Results: Basic Metabolic Panel:  Recent Labs  16/10/96 1450 03/23/13 0245  NA 136 137  K 3.8 3.7  CL 100 104  CO2 16* 16*  GLUCOSE 157* 127*  BUN 24* 21  CREATININE 1.25* 1.15*   CBC:  Recent Labs  03/22/13 1450 03/23/13 0245  WBC 2.8* 4.6  NEUTROABS 0.8*  --   HGB 10.4* 9.7*  HCT 30.5* 28.9*  MCV 89.2 90.6  PLT 257 258   Cardiac Enzymes:  Recent Labs  03/22/13 1450 03/22/13 1909 03/23/13 0245  TROPONINI 0.87* 1.87* 1.23*    Telemetry: Atrial fibrillation with rapid ventricular response still following a couple of doses of Lanoxin  Assessment/Plan: 1. Rapid atrial fibrillation persists 2. Hypotension 3. Non-STEMI likely secondary to demand ischemia and rapid atrial fibrillation  Recommendations:  I would go ahead with rapid atrial fibrillation and initiate intravenous amiodarone until the digoxin can take effect. Obtain 2-D echocardiogram. Her blood pressure is currently 88 limiting the use of beta blockers and diltiazem.     Darden Palmer  MD [facility] Cardiology  03/23/2013, 8:07 AM

## 2013-03-23 NOTE — Progress Notes (Signed)
  Echocardiogram 2D Echocardiogram has been performed.  Arvil Chaco 03/23/2013, 9:15 AM

## 2013-03-23 NOTE — Progress Notes (Signed)
N&V (nausea and vomiting)  Subjective: Pt states she vomited bile about 3 am.  She is having occasional nausea  Objective: Vital signs in last 24 hours: Temp:  [97.7 F (36.5 C)-98.3 F (36.8 C)] 98.1 F (36.7 C) (11/01 0800) Pulse Rate:  [39-144] 108 (11/01 0800) Resp:  [17-27] 18 (11/01 0800) BP: (70-143)/(35-91) 88/51 mmHg (11/01 0800) SpO2:  [84 %-100 %] 98 % (11/01 0800) Weight:  [156 lb 1.4 oz (70.8 kg)-162 lb 4.1 oz (73.6 kg)] 162 lb 4.1 oz (73.6 kg) (11/01 0400) Last BM Date: 03/22/13  Intake/Output from previous day: 10/31 0701 - 11/01 0700 In: 2618.8 [I.V.:1118.8; IV Piggyback:1500] Out: 675 [Urine:575; Emesis/NG output:100] Intake/Output this shift: Total I/O In: -  Out: 1 [Emesis/NG output:1]  General appearance: alert and cooperative GI: normal findings: soft, non-tender mildly distended  Lab Results:  Results for orders placed during the hospital encounter of 03/22/13 (from the past 24 hour(s))  CBC WITH DIFFERENTIAL     Status: Abnormal   Collection Time    03/22/13  2:50 PM      Result Value Range   WBC 2.8 (*) 4.0 - 10.5 K/uL   RBC 3.42 (*) 3.87 - 5.11 MIL/uL   Hemoglobin 10.4 (*) 12.0 - 15.0 g/dL   HCT 16.1 (*) 09.6 - 04.5 %   MCV 89.2  78.0 - 100.0 fL   MCH 30.4  26.0 - 34.0 pg   MCHC 34.1  30.0 - 36.0 g/dL   RDW 40.9 (*) 81.1 - 91.4 %   Platelets 257  150 - 400 K/uL   Neutrophils Relative % 28 (*) 43 - 77 %   Lymphocytes Relative 24  12 - 46 %   Monocytes Relative 47 (*) 3 - 12 %   Eosinophils Relative 1  0 - 5 %   Basophils Relative 0  0 - 1 %   Neutro Abs 0.8 (*) 1.7 - 7.7 K/uL   Lymphs Abs 0.7  0.7 - 4.0 K/uL   Monocytes Absolute 1.3 (*) 0.1 - 1.0 K/uL   Eosinophils Absolute 0.0  0.0 - 0.7 K/uL   Basophils Absolute 0.0  0.0 - 0.1 K/uL   RBC Morphology POLYCHROMASIA PRESENT    COMPREHENSIVE METABOLIC PANEL     Status: Abnormal   Collection Time    03/22/13  2:50 PM      Result Value Range   Sodium 136  135 - 145 mEq/L   Potassium 3.8   3.5 - 5.1 mEq/L   Chloride 100  96 - 112 mEq/L   CO2 16 (*) 19 - 32 mEq/L   Glucose, Bld 157 (*) 70 - 99 mg/dL   BUN 24 (*) 6 - 23 mg/dL   Creatinine, Ser 7.82 (*) 0.50 - 1.10 mg/dL   Calcium 9.5  8.4 - 95.6 mg/dL   Total Protein 5.8 (*) 6.0 - 8.3 g/dL   Albumin 2.8 (*) 3.5 - 5.2 g/dL   AST 19  0 - 37 U/L   ALT 9  0 - 35 U/L   Alkaline Phosphatase 65  39 - 117 U/L   Total Bilirubin 0.8  0.3 - 1.2 mg/dL   GFR calc non Af Amer 38 (*) >90 mL/min   GFR calc Af Amer 44 (*) >90 mL/min  LIPASE, BLOOD     Status: None   Collection Time    03/22/13  2:50 PM      Result Value Range   Lipase 15  11 - 59 U/L  TROPONIN  I     Status: Abnormal   Collection Time    03/22/13  2:50 PM      Result Value Range   Troponin I 0.87 (*) <0.30 ng/mL  URINALYSIS W MICROSCOPIC + REFLEX CULTURE     Status: Abnormal   Collection Time    03/22/13  3:56 PM      Result Value Range   Color, Urine YELLOW  YELLOW   APPearance CLEAR  CLEAR   Specific Gravity, Urine 1.020  1.005 - 1.030   pH 5.5  5.0 - 8.0   Glucose, UA NEGATIVE  NEGATIVE mg/dL   Hgb urine dipstick NEGATIVE  NEGATIVE   Bilirubin Urine NEGATIVE  NEGATIVE   Ketones, ur 15 (*) NEGATIVE mg/dL   Protein, ur NEGATIVE  NEGATIVE mg/dL   Urobilinogen, UA 1.0  0.0 - 1.0 mg/dL   Nitrite NEGATIVE  NEGATIVE   Leukocytes, UA NEGATIVE  NEGATIVE   Urine-Other       Value: NO FORMED ELEMENTS SEEN ON URINE MICROSCOPIC EXAMINATION  LACTIC ACID, PLASMA     Status: None   Collection Time    03/22/13  4:28 PM      Result Value Range   Lactic Acid, Venous 1.1  0.5 - 2.2 mmol/L  TROPONIN I     Status: Abnormal   Collection Time    03/22/13  7:09 PM      Result Value Range   Troponin I 1.87 (*) <0.30 ng/mL  MRSA PCR SCREENING     Status: None   Collection Time    03/22/13  8:51 PM      Result Value Range   MRSA by PCR NEGATIVE  NEGATIVE  BASIC METABOLIC PANEL     Status: Abnormal   Collection Time    03/23/13  2:45 AM      Result Value Range   Sodium  137  135 - 145 mEq/L   Potassium 3.7  3.5 - 5.1 mEq/L   Chloride 104  96 - 112 mEq/L   CO2 16 (*) 19 - 32 mEq/L   Glucose, Bld 127 (*) 70 - 99 mg/dL   BUN 21  6 - 23 mg/dL   Creatinine, Ser 4.09 (*) 0.50 - 1.10 mg/dL   Calcium 8.4  8.4 - 81.1 mg/dL   GFR calc non Af Amer 42 (*) >90 mL/min   GFR calc Af Amer 48 (*) >90 mL/min  TROPONIN I     Status: Abnormal   Collection Time    03/23/13  2:45 AM      Result Value Range   Troponin I 1.23 (*) <0.30 ng/mL  CBC     Status: Abnormal   Collection Time    03/23/13  2:45 AM      Result Value Range   WBC 4.6  4.0 - 10.5 K/uL   RBC 3.19 (*) 3.87 - 5.11 MIL/uL   Hemoglobin 9.7 (*) 12.0 - 15.0 g/dL   HCT 91.4 (*) 78.2 - 95.6 %   MCV 90.6  78.0 - 100.0 fL   MCH 30.4  26.0 - 34.0 pg   MCHC 33.6  30.0 - 36.0 g/dL   RDW 21.3 (*) 08.6 - 57.8 %   Platelets 258  150 - 400 K/uL  TROPONIN I     Status: Abnormal   Collection Time    03/23/13  7:50 AM      Result Value Range   Troponin I 0.84 (*) <0.30 ng/mL  PROCALCITONIN  Status: None   Collection Time    03/23/13  7:50 AM      Result Value Range   Procalcitonin <0.10       Studies/Results Radiology     MEDS, Scheduled . atorvastatin  20 mg Oral Q supper  . buPROPion  150 mg Oral Daily  . [START ON 04/06/2013] cyanocobalamin  1,000 mcg Intramuscular Q30 days  . digoxin  0.25 mg Intravenous Q6H  . [START ON 03/24/2013] digoxin  0.25 mg Intravenous Daily  . levothyroxine  125 mcg Oral QAC breakfast  . multivitamin with minerals  1 tablet Oral Daily  . mycophenolate  1,000 mg Oral BID  . pantoprazole (PROTONIX) IV  40 mg Intravenous Q12H  . sodium chloride  3 mL Intravenous Q12H     Assessment: N&V (nausea and vomiting)   Plan: Pt still having episodes of vomiting.  Recommend placing NG.  Pt agreeable to this   LOS: 1 day    Vanita Panda, MD [facility], [place] [phone]   03/23/2013 11:19 AM

## 2013-03-24 DIAGNOSIS — E43 Unspecified severe protein-calorie malnutrition: Secondary | ICD-10-CM | POA: Diagnosis present

## 2013-03-24 DIAGNOSIS — J9600 Acute respiratory failure, unspecified whether with hypoxia or hypercapnia: Secondary | ICD-10-CM

## 2013-03-24 LAB — COMPREHENSIVE METABOLIC PANEL
ALT: 7 U/L (ref 0–35)
AST: 11 U/L (ref 0–37)
Alkaline Phosphatase: 51 U/L (ref 39–117)
BUN: 12 mg/dL (ref 6–23)
CO2: 18 mEq/L — ABNORMAL LOW (ref 19–32)
Calcium: 8.4 mg/dL (ref 8.4–10.5)
Creatinine, Ser: 0.95 mg/dL (ref 0.50–1.10)
GFR calc Af Amer: 61 mL/min — ABNORMAL LOW (ref >90)
GFR calc non Af Amer: 53 mL/min — ABNORMAL LOW (ref >90)
Potassium: 2.9 mEq/L — ABNORMAL LOW (ref 3.5–5.1)
Sodium: 140 mEq/L (ref 135–145)
Total Bilirubin: 0.4 mg/dL (ref 0.3–1.2)
Total Protein: 4.5 g/dL — ABNORMAL LOW (ref 6.0–8.3)

## 2013-03-24 LAB — CBC
HCT: 26.7 % — ABNORMAL LOW (ref 36.0–46.0)
Hemoglobin: 8.8 g/dL — ABNORMAL LOW (ref 12.0–15.0)
MCH: 30.6 pg (ref 26.0–34.0)
MCHC: 33 g/dL (ref 30.0–36.0)
MCV: 92.7 fL (ref 78.0–100.0)
Platelets: 189 10*3/uL (ref 150–400)
RBC: 2.88 MIL/uL — ABNORMAL LOW (ref 3.87–5.11)
RDW: 16.2 % — ABNORMAL HIGH (ref 11.5–15.5)
WBC: 5 10*3/uL (ref 4.0–10.5)

## 2013-03-24 LAB — MAGNESIUM: Magnesium: 1.3 mg/dL — ABNORMAL LOW (ref 1.5–2.5)

## 2013-03-24 LAB — PHOSPHORUS: Phosphorus: 3 mg/dL (ref 2.3–4.6)

## 2013-03-24 MED ORDER — POTASSIUM CHLORIDE 10 MEQ/50ML IV SOLN
INTRAVENOUS | Status: AC
  Administered 2013-03-24: 10 meq
  Filled 2013-03-24: qty 50

## 2013-03-24 MED ORDER — LEVOTHYROXINE SODIUM 100 MCG IV SOLR
62.5000 ug | Freq: Every day | INTRAVENOUS | Status: DC
  Administered 2013-03-24 – 2013-03-27 (×4): 62.5 ug via INTRAVENOUS
  Filled 2013-03-24 (×5): qty 5

## 2013-03-24 MED ORDER — LEVOTHYROXINE SODIUM 100 MCG IV SOLR: 75.0000 ug | Freq: Every day | INTRAVENOUS | Status: DC

## 2013-03-24 MED ORDER — POTASSIUM CHLORIDE 10 MEQ/100ML IV SOLN
10.0000 meq | INTRAVENOUS | Status: AC
  Administered 2013-03-24 (×5): 10 meq via INTRAVENOUS
  Filled 2013-03-24 (×5): qty 100

## 2013-03-24 MED ORDER — PHENOL 1.4 % MT LIQD
2.0000 | OROMUCOSAL | Status: DC | PRN
  Filled 2013-03-24: qty 177

## 2013-03-24 NOTE — Progress Notes (Addendum)
[facility] PROGRESS NOTE  Chaz Ronning ZOX:[id number] DOB: May 25, 1924 DOA: 03/22/2013 PCP: Nelwyn Salisbury, MD  Brief narrative: 79 year old female with past medical history of ulcerative colitis, CMV colitis, hypertension, hypothyroidism, anxiety who presented to [facility] ED 03/22/2013 with complaints of intractable nausea and vomiting associated with poor oral intake over past 1 week prior to this admission.  In ED, vital signs were as follows: BP 70/35, HR 39-144 and T max 98.3 F. Oxygen saturation was 84% on room air. BP has improved with initial IV fluids (1.5 L NS) to 143/80 but then bounced back to 88/51.Patient however did not require pressor support. In addition, CT abdomen revealed incarcerated hernia adn loop of small bowel within the hernia for which reason surgery was consulted. Hospital course is complicated with additional problem of A fib with RVR and NSTEMI. Cardiology is assisting the management.   Assessment/Plan:   Principal Problem:  N&V (nausea and vomiting) in the setting of incarcerated hernia and small bowel within the hernia  - likely related to loop of small bowel within the incarcerated hernia seen on CT abdomen  - NG tube in place with drainage - appreciate surgery following   Active Problems:  Atrial fibrillation with RVR  - appreciate cardiology following  - pt was started on amiodarone and digoxin per cardio recomendations  - heart rate regular NSTEMI (non-ST elevated myocardial infarction)  - cardiac enzymes are trending down, 1.23 --> 0.62  - 2 D ECHO on this admission - EF 30% to 35%; akinesisof the entire apical myocardium; severe hypokinesis of the mid-distalanteroseptal, anterolateral and inferior myocardium; grade 1 diastolic dysfunction - cardio is following  Acute renal failure  - likely due to GI losses, dehydration, pre-renal etiology  - renal function WNL Hypokalemia - repleted today with potassium chloride 10 meq x 6 doses IV; potassium  was 2.9 Anemia of chronic disease  - secondary to history of thyroid disease  - hemoglobin 10.4 --> 9.7 --> 8.8 Acute hypoxic respiratory failure  - unclear etiology  - CXR did not reveal acute cardiopulmonary porcess  - resp status stable; O2 saturation 100% on 2 L nasal canula HYPOTHYROIDISM  - continue synthroid IV ANXIETY  - ativan ordered IV PRN at bedtime  Incarcerated incisional hernia  - surgery is following  Moderate protein-calorie malnutrition  - has NG tube  - may improve once pt resumes oral intake    Code Status: full code  Family Communication: no family at the bedside  Disposition Plan: remains inpatient   Manson Passey, MD  [facility]  Pager [phone]   Consultants:  Cardiology  Surgery Procedures:  NG tube placement 03/23/2013 Antibiotics:  None   If 7PM-7AM, please contact night-coverage [email or web address] Password [facility] 03/24/2013, 6:37 AM   LOS: 2 days    HPI/Subjective: No acute overnight events.  Objective: Filed Vitals:   03/24/13 0300 03/24/13 0400 03/24/13 0452 03/24/13 0500  BP: 121/49 134/51  138/54  Pulse:  73  73  Temp:  97.5 F (36.4 C)    TempSrc:  Oral    Resp: 16 6  15   Height:      Weight:   75.7 kg (166 lb 14.2 oz)   SpO2: 100% 100%  100%    Intake/Output Summary (Last 24 hours) at 03/24/13 0637 Last data filed at 03/24/13 0500  Gross per 24 hour  Intake 4503.3 ml  Output   1731 ml  Net 2772.3 ml    Exam:   General:  Pt is  alert, follows commands appropriately, not in acute distress  Cardiovascular: Regular rate and rhythm, S1/S2 appreciated  Respiratory: Clear to auscultation bilaterally, no wheezing, no crackles, no rhonchi  Abdomen: Soft, non tender, non distended, bowel sounds present, hernia palpable in left mid lower abdomen  Extremities: No edema, pulses DP and PT palpable bilaterally  Neuro: Grossly nonfocal  Data Reviewed: Basic Metabolic Panel:  Recent Labs Lab 03/22/13 1450  03/23/13 0245 03/24/13 0452  NA 136 137 140  K 3.8 3.7 2.9*  CL 100 104 111  CO2 16* 16* 18*  GLUCOSE 157* 127* 112*  BUN 24* 21 12  CREATININE 1.25* 1.15* 0.95  CALCIUM 9.5 8.4 8.4  MG  --   --  1.3*  PHOS  --   --  3.0   Liver Function Tests:  Recent Labs Lab 03/22/13 1450 03/24/13 0452  AST 19 11  ALT 9 7  ALKPHOS 65 51  BILITOT 0.8 0.4  PROT 5.8* 4.5*  ALBUMIN 2.8* 2.2*    Recent Labs Lab 03/22/13 1450  LIPASE 15   No results found for this basename: AMMONIA,  in the last 168 hours CBC:  Recent Labs Lab 03/22/13 1450 03/23/13 0245 03/24/13 0452  WBC 2.8* 4.6 5.0  NEUTROABS 0.8*  --   --   HGB 10.4* 9.7* 8.8*  HCT 30.5* 28.9* 26.7*  MCV 89.2 90.6 92.7  PLT 257 258 189   Cardiac Enzymes:  Recent Labs Lab 03/22/13 1450 03/22/13 1909 03/23/13 0245 03/23/13 0750 03/23/13 1350  TROPONINI 0.87* 1.87* 1.23* 0.84* 0.62*   BNP: No components found with this basename: POCBNP,  CBG: No results found for this basename: GLUCAP,  in the last 168 hours  Recent Results (from the past 240 hour(s))  MRSA PCR SCREENING     Status: None   Collection Time    03/22/13  8:51 PM      Result Value Range Status   MRSA by PCR NEGATIVE  NEGATIVE Final     Studies: Ct Abdomen Pelvis Wo Contrast 03/22/2013    IMPRESSION: Infraumbilical hernia containing a small bowel loop with likely a component of small bowel obstruction and mild bowel wall edema at the site.  Porcelain gallbladder, cannot exclude coexistent internal gallstone.  Small pericardial effusion.  Tiny umbilical hernia containing fat.  Mild diffuse thickening of the colon is questioned, which may be related to history of ulcerative colitis.   Electronically Signed   By: Ulyses Southward M.D.   On: 03/22/2013 18:17   Dg Chest 2 View 03/22/2013    IMPRESSION: No acute abnormality seen.   Electronically Signed   By: Alcide Clever M.D.   On: 03/22/2013 16:31    Scheduled Meds: . atorvastatin  20 mg Oral Q supper   . buPROPion  150 mg Oral Daily  . digoxin  0.25 mg Intravenous Daily  . levothyroxine  125 mcg Oral QAC breakfast  . multivitamin  1 tablet Oral Daily  . mycophenolate  1,000 mg Oral BID  . pantoprazole   40 mg Intravenous Q12H   Continuous Infusions: . amiodarone (NEXTERONE PREMIX) 360 mg/200 mL dextrose 30 mg/hr (03/24/13 0444)

## 2013-03-24 NOTE — Progress Notes (Signed)
INITIAL NUTRITION ASSESSMENT  Patient meets criteria for Severe Malnutrition related to chronic illness AEB 12% weight loss in the past 3 months and 25% weight loss in the past 6-12 months as well as intake <75% for > 1 month.  DOCUMENTATION CODES Per approved criteria  -Severe malnutrition in the context of chronic illness   INTERVENTION: RD to monitor Add [employer] when diet advanced Consider tpn if unable to advance diet.  NUTRITION DIAGNOSIS: Inadequate oral intake related to altered GI function as evidenced by NPO status.   Goal: Diet advancement with intake of >90% estimated needs with meals and supplements.  Monitor:  Diet advancement, labs, weight  Reason for Assessment: mst  79 y.o. female  Admitting Dx: N&V (nausea and vomiting)  ASSESSMENT: Patient with NG tube in place.  Admitted with N/V in the setting of incarcerated hernia and small bowel within the hernia.  Surgery is following.  Patient reports GI problems started with diarrhea and poor appetite several months ago.  Weight was 220 lbs at MD physical 10/13.    Nutrition Focused Physical Exam:  Subcutaneous Fat:  Orbital Region: severe Upper Arm Region: wnl Thoracic and Lumbar Region: moderate  Muscle:  Temple Region: severe Clavicle Bone Region: moderate Clavicle and Acromion Bone Region: mild Scapular Bone Region: mild Dorsal Hand: wnl Patellar Region: mild Anterior Thigh Region: mild Posterior Calf Region: n/a  Edema: not noted   Height: Ht Readings from Last 1 Encounters:  03/22/13 5\' 5"  (1.651 m)    Weight: Wt Readings from Last 1 Encounters:  03/24/13 166 lb 14.2 oz (75.7 kg)    Ideal Body Weight: 125 lbs  % Ideal Body Weight: 133  Wt Readings from Last 10 Encounters:  03/24/13 166 lb 14.2 oz (75.7 kg)  03/18/13 159 lb 12 oz (72.462 kg)  02/26/13 176 lb (79.833 kg)  02/07/13 189 lb 1.6 oz (85.775 kg)  01/27/13 177 lb 4 oz (80.4 kg)  01/27/13 177 lb 4 oz (80.4 kg)   01/22/13 174 lb (78.926 kg)  01/11/13 175 lb 8 oz (79.606 kg)  12/31/12 184 lb 9.6 oz (83.734 kg)  12/21/12 188 lb (85.276 kg)    Usual Body Weight: 220 lbs 1 year ago  % Usual Body Weight: 75% of weight 1 year ago.  (per patient, most of weight was lost within the past 6 months.)  12% weight loss within the past 3 months.  BMI:  Body mass index is 27.77 kg/(m^2).  Estimated Nutritional Needs: Kcal: 1600-1800 Protein: 90-100 gms Fluid: 1.95L daily  Skin: no rash or lesion  Diet Order: NPO  EDUCATION NEEDS: -No education needs identified at this time   Intake/Output Summary (Last 24 hours) at 03/24/13 0851 Last data filed at 03/24/13 0800  Gross per 24 hour  Intake   4237 ml  Output   1731 ml  Net   2506 ml    Last BM: unknown  Labs:  No rash or lesion   Recent Labs Lab 03/22/13 1450 03/23/13 0245 03/24/13 0452  NA 136 137 140  K 3.8 3.7 2.9*  CL 100 104 111  CO2 16* 16* 18*  BUN 24* 21 12  CREATININE 1.25* 1.15* 0.95  CALCIUM 9.5 8.4 8.4  MG  --   --  1.3*  PHOS  --   --  3.0  GLUCOSE 157* 127* 112*    CBG (last 3)  No results found for this basename: GLUCAP,  in the last 72 hours  Scheduled Meds: .  atorvastatin  20 mg Oral Q supper  . [START ON 04/06/2013] cyanocobalamin  1,000 mcg Intramuscular Q30 days  . levothyroxine  75 mcg Intravenous Daily  . multivitamin with minerals  1 tablet Oral Daily  . mycophenolate  1,000 mg Oral BID  . pantoprazole (PROTONIX) IV  40 mg Intravenous Q12H  . potassium chloride  10 mEq Intravenous Q1 Hr x 6  . sodium chloride  3 mL Intravenous Q12H    Continuous Infusions: . sodium chloride 125 mL (03/24/13 0409)  . amiodarone (NEXTERONE PREMIX) 360 mg/200 mL dextrose 30 mg/hr (03/24/13 0444)    Past Medical History  Diagnosis Date  . Anemia   . Hyperlipemia   . Hypertension   . Thyroid disease     hypothyroidism  . Anxiety   . Paroxysmal atrial fibrillation   . Vitamin B 12 deficiency   . CHF  (congestive heart failure)   . TIA (transient ischemic attack)     4-09  . Microscopic hematuria     benign microscopic hematuria, worked up b Dr Alexis Frock  . Gall stone     porcelain gall bladder with single stone  per CT 02-2009 Dr Birdie Sons  . Osteoporosis     DEXA on 04-30-10  . Myasthenia gravis     sees Dr. Terrace Arabia   . Ulcerative colitis   . DVT of leg (deep venous thrombosis)     left leg   . CMV colitis     Past Surgical History  Procedure Laterality Date  . Cardiac catheterization      04/1999  . Abdominal hysterectomy      with oophorectomy  . Colonoscopy  1999    normal   . Colonoscopy N/A 12/10/2012    Procedure: COLONOSCOPY;  Surgeon: Iva Boop, MD;  Location: [facility];  Service: Endoscopy;  Laterality: N/A;  . Flexible sigmoidoscopy N/A 01/26/2013    Procedure: FLEXIBLE SIGMOIDOSCOPY;  Surgeon: Iva Boop, MD;  Location: [facility];  Service: Endoscopy;  Laterality: N/A;    Oran Rein, RD, LDN Clinical Inpatient Dietitian Pager:  [phone] Weekend and after hours pager:  [phone]

## 2013-03-24 NOTE — Progress Notes (Signed)
MD on-call for Dr Sondra Come notified @ 0120 of patient change in rhythm from A-fib to first degree heart block and to Left Bundle branch block per EKG. Pt alert and oriented x 3., Amiodarone running at 30mg /hr. No new order received.

## 2013-03-24 NOTE — Progress Notes (Addendum)
Subjective:  Converted to sinus rhythm around midnight last night. Her blood pressure has improved significantly. She was having a lot of vomiting and an NG tube was replaced. No current shortness of breath or chest pain. Echocardiogram showed an EF of 30-35%. Her EF in September was 50-55%. This would suggest either stress-induced or due to rapid atrial fibrillation.  Objective:  Vital Signs in the last 24 hours: BP 143/60  Pulse 73  Temp(Src) 97.5 F (36.4 C) (Oral)  Resp 15  Ht 5\' 5"  (1.651 m)  Wt 75.7 kg (166 lb 14.2 oz)  BMI 27.77 kg/m2  SpO2 100%  Physical Exam: Pleasant white female with NG tube in place Lungs:  Clear Cardiac:  Regular rhythm, normal S1 and S2, no S3 Abdomen: Mildly distended, mildly tender  Extremities:  No edema present  Intake/Output from previous day: 11/01 0701 - 11/02 0700 In: 4645 [P.O.:120; I.V.:3925; NG/GT:100; IV Piggyback:500] Out: 1731 [Urine:930; Emesis/NG output:801]  Weight Filed Weights   03/22/13 2120 03/23/13 0400 03/24/13 0452  Weight: 70.8 kg (156 lb 1.4 oz) 73.6 kg (162 lb 4.1 oz) 75.7 kg (166 lb 14.2 oz)    Lab Results: Basic Metabolic Panel:  Recent Labs  16/10/96 0245 03/24/13 0452  NA 137 140  K 3.7 2.9*  CL 104 111  CO2 16* 18*  GLUCOSE 127* 112*  BUN 21 12  CREATININE 1.15* 0.95   CBC:  Recent Labs  03/22/13 1450 03/23/13 0245 03/24/13 0452  WBC 2.8* 4.6 5.0  NEUTROABS 0.8*  --   --   HGB 10.4* 9.7* 8.8*  HCT 30.5* 28.9* 26.7*  MCV 89.2 90.6 92.7  PLT 257 258 189   Cardiac Enzymes:  Recent Labs  03/23/13 0245 03/23/13 0750 03/23/13 1350  TROPONINI 1.23* 0.84* 0.62*    Telemetry: Sinus rhythm with left bundle branch block  Assessment/Plan: 1. atrial fibrillation that has now converted to sinus rhythm on IV amiodarone 2. cardiomyopathy that could have been due to rapid atrial fibrillation, stress-induced Arsenio Katz) her ejection fraction in September was 50-55%. 3. Non-STEMI likely secondary  to demand ischemia and rapid atrial fibrillation 4. Hypokalemia  Recommendations:  Discontinue digoxin since she is in sinus rhythm and continue IV amiodarone for the time being. She may be a candidate for low-dose amiodarone on discharge. Not currently a good candidate for anticoagulation. She does have depressed LV function and eventually she may need beta blockers now that her blood pressure is improved but will hold off now since she is n.p.o.     W. Ashley Royalty  MD [facility]  03/24/2013, 8:02 AM

## 2013-03-24 NOTE — Progress Notes (Signed)
N&V (nausea and vomiting)  Subjective: Nausea better with NG in place (800 ml out), c/o sore throat  Objective: Vital signs in last 24 hours: Temp:  [97.5 F (36.4 C)-98.1 F (36.7 C)] 97.5 F (36.4 C) (11/02 0400) Pulse Rate:  [61-124] 77 (11/02 0600) Resp:  [6-22] 19 (11/02 0600) BP: (80-152)/(42-81) 152/81 mmHg (11/02 0600) SpO2:  [98 %-100 %] 100 % (11/02 0600) Weight:  [166 lb 14.2 oz (75.7 kg)] 166 lb 14.2 oz (75.7 kg) (11/02 0452) Last BM Date: 03/21/13  Intake/Output from previous day: 11/01 0701 - 11/02 0700 In: 4645 [P.O.:120; I.V.:3925; NG/GT:100; IV Piggyback:500] Out: 1731 [Urine:930; Emesis/NG output:801] Intake/Output this shift:    General appearance: alert and cooperative GI: normal findings: soft, mildly distended, incarcerated hernia slightly tender to palpation (stable) NG with dark bilious output Lab Results:  Results for orders placed during the hospital encounter of 03/22/13 (from the past 24 hour(s))  TROPONIN I     Status: Abnormal   Collection Time    03/23/13  7:50 AM      Result Value Range   Troponin I 0.84 (*) <0.30 ng/mL  PROCALCITONIN     Status: None   Collection Time    03/23/13  7:50 AM      Result Value Range   Procalcitonin <0.10    TROPONIN I     Status: Abnormal   Collection Time    03/23/13  1:50 PM      Result Value Range   Troponin I 0.62 (*) <0.30 ng/mL  COMPREHENSIVE METABOLIC PANEL     Status: Abnormal   Collection Time    03/24/13  4:52 AM      Result Value Range   Sodium 140  135 - 145 mEq/L   Potassium 2.9 (*) 3.5 - 5.1 mEq/L   Chloride 111  96 - 112 mEq/L   CO2 18 (*) 19 - 32 mEq/L   Glucose, Bld 112 (*) 70 - 99 mg/dL   BUN 12  6 - 23 mg/dL   Creatinine, Ser 1.61  0.50 - 1.10 mg/dL   Calcium 8.4  8.4 - 09.6 mg/dL   Total Protein 4.5 (*) 6.0 - 8.3 g/dL   Albumin 2.2 (*) 3.5 - 5.2 g/dL   AST 11  0 - 37 U/L   ALT 7  0 - 35 U/L   Alkaline Phosphatase 51  39 - 117 U/L   Total Bilirubin 0.4  0.3 - 1.2 mg/dL   GFR calc non Af Amer 53 (*) >90 mL/min   GFR calc Af Amer 61 (*) >90 mL/min  CBC     Status: Abnormal   Collection Time    03/24/13  4:52 AM      Result Value Range   WBC 5.0  4.0 - 10.5 K/uL   RBC 2.88 (*) 3.87 - 5.11 MIL/uL   Hemoglobin 8.8 (*) 12.0 - 15.0 g/dL   HCT 04.5 (*) 40.9 - 81.1 %   MCV 92.7  78.0 - 100.0 fL   MCH 30.6  26.0 - 34.0 pg   MCHC 33.0  30.0 - 36.0 g/dL   RDW 91.4 (*) 78.2 - 95.6 %   Platelets 189  150 - 400 K/uL  MAGNESIUM     Status: Abnormal   Collection Time    03/24/13  4:52 AM      Result Value Range   Magnesium 1.3 (*) 1.5 - 2.5 mg/dL  PHOSPHORUS     Status: None   Collection Time  03/24/13  4:52 AM      Result Value Range   Phosphorus 3.0  2.3 - 4.6 mg/dL     Studies/Results Radiology     MEDS, Scheduled . atorvastatin  20 mg Oral Q supper  . buPROPion  150 mg Oral Daily  . [START ON 04/06/2013] cyanocobalamin  1,000 mcg Intramuscular Q30 days  . digoxin  0.25 mg Intravenous Daily  . levothyroxine  125 mcg Oral QAC breakfast  . multivitamin with minerals  1 tablet Oral Daily  . mycophenolate  1,000 mg Oral BID  . pantoprazole (PROTONIX) IV  40 mg Intravenous Q12H  . sodium chloride  3 mL Intravenous Q12H     Assessment: N&V (nausea and vomiting) Incarcerated hernia- stable exam  Plan: Pt will most likely benefit from hernia repair once medically stable for surgery   LOS: 2 days    Vanita Panda, MD [facility], [place] [phone]   03/24/2013 7:37 AM

## 2013-03-25 ENCOUNTER — Encounter (HOSPITAL_COMMUNITY)
Admission: EM | Disposition: A | Discharge status: Rehabilitation Facility | Payer: Self-pay | Source: Home / Self Care | Attending: Internal Medicine

## 2013-03-25 ENCOUNTER — Ambulatory Visit: Payer: Medicare Other | Admitting: Internal Medicine

## 2013-03-25 ENCOUNTER — Inpatient Hospital Stay (HOSPITAL_COMMUNITY): Payer: Medicare Other

## 2013-03-25 ENCOUNTER — Encounter (HOSPITAL_COMMUNITY): Payer: Medicare Other | Admitting: Anesthesiology

## 2013-03-25 ENCOUNTER — Inpatient Hospital Stay (HOSPITAL_COMMUNITY): Payer: Medicare Other | Admitting: Anesthesiology

## 2013-03-25 DIAGNOSIS — K8066 Calculus of gallbladder and bile duct with acute and chronic cholecystitis without obstruction: Secondary | ICD-10-CM

## 2013-03-25 DIAGNOSIS — K56609 Unspecified intestinal obstruction, unspecified as to partial versus complete obstruction: Secondary | ICD-10-CM | POA: Diagnosis present

## 2013-03-25 DIAGNOSIS — K5190 Ulcerative colitis, unspecified, without complications: Secondary | ICD-10-CM | POA: Insufficient documentation

## 2013-03-25 HISTORY — PX: LAPAROSCOPIC LYSIS OF ADHESIONS: SHX5905

## 2013-03-25 HISTORY — PX: OMENTECTOMY: SHX5985

## 2013-03-25 HISTORY — PX: INSERTION OF MESH: SHX5868

## 2013-03-25 HISTORY — PX: VENTRAL HERNIA REPAIR: SHX424

## 2013-03-25 HISTORY — PX: CHOLECYSTECTOMY: SHX55

## 2013-03-25 LAB — BLOOD GAS, ARTERIAL
Acid-base deficit: 12.9 mmol/L — ABNORMAL HIGH (ref 0.0–2.0)
Drawn by: 307971
O2 Content: 10 L/min
O2 Saturation: 99.4 %
Patient temperature: 98.6
pCO2 arterial: 28.2 mmHg — ABNORMAL LOW (ref 35.0–45.0)
pO2, Arterial: 271 mmHg — ABNORMAL HIGH (ref 80.0–100.0)

## 2013-03-25 LAB — BASIC METABOLIC PANEL
CO2: 18 mEq/L — ABNORMAL LOW (ref 19–32)
Chloride: 112 mEq/L (ref 96–112)
Creatinine, Ser: 0.86 mg/dL (ref 0.50–1.10)
GFR calc Af Amer: 69 mL/min — ABNORMAL LOW (ref >90)
GFR calc non Af Amer: 59 mL/min — ABNORMAL LOW (ref >90)
Potassium: 3.1 mEq/L — ABNORMAL LOW (ref 3.5–5.1)
Sodium: 140 mEq/L (ref 135–145)

## 2013-03-25 LAB — POCT I-STAT 7, (LYTES, BLD GAS, ICA,H+H)
Acid-base deficit: 11 mmol/L — ABNORMAL HIGH (ref 0.0–2.0)
Bicarbonate: 15 mEq/L — ABNORMAL LOW (ref 20.0–24.0)
HCT: 25 % — ABNORMAL LOW (ref 36.0–46.0)
Hemoglobin: 8.5 g/dL — ABNORMAL LOW (ref 12.0–15.0)
O2 Saturation: 100 %
Sodium: 144 mEq/L (ref 135–145)
TCO2: 16 mmol/L (ref 0–100)
pO2, Arterial: 201 mmHg — ABNORMAL HIGH (ref 80.0–100.0)

## 2013-03-25 LAB — GLUCOSE, CAPILLARY: Glucose-Capillary: 72 mg/dL (ref 70–99)

## 2013-03-25 LAB — PATHOLOGIST SMEAR REVIEW

## 2013-03-25 SURGERY — REPAIR, HERNIA, VENTRAL, LAPAROSCOPIC
Anesthesia: General | Site: Abdomen | Wound class: Clean Contaminated

## 2013-03-25 MED ORDER — FENTANYL CITRATE 0.05 MG/ML IJ SOLN
25.0000 ug | INTRAMUSCULAR | Status: DC | PRN
  Administered 2013-03-26 – 2013-03-27 (×3): 50 ug via INTRAVENOUS
  Administered 2013-03-28 – 2013-03-29 (×2): 25 ug via INTRAVENOUS
  Filled 2013-03-25 (×6): qty 2

## 2013-03-25 MED ORDER — NEOSTIGMINE METHYLSULFATE 1 MG/ML IJ SOLN
INTRAMUSCULAR | Status: DC | PRN
  Administered 2013-03-25: 4 mg via INTRAVENOUS

## 2013-03-25 MED ORDER — METRONIDAZOLE IN NACL 5-0.79 MG/ML-% IV SOLN
500.0000 mg | INTRAVENOUS | Status: AC
  Administered 2013-03-25: 500 mg via INTRAVENOUS

## 2013-03-25 MED ORDER — BIOTENE DRY MOUTH MT LIQD
15.0000 mL | Freq: Two times a day (BID) | OROMUCOSAL | Status: DC
  Administered 2013-03-25 – 2013-04-02 (×15): 15 mL via OROMUCOSAL

## 2013-03-25 MED ORDER — POTASSIUM CHLORIDE 10 MEQ/100ML IV SOLN
10.0000 meq | INTRAVENOUS | Status: AC
  Administered 2013-03-25 (×4): 10 meq via INTRAVENOUS
  Filled 2013-03-25 (×4): qty 100

## 2013-03-25 MED ORDER — BUPIVACAINE-EPINEPHRINE 0.25% -1:200000 IJ SOLN
INTRAMUSCULAR | Status: AC
  Filled 2013-03-25: qty 1

## 2013-03-25 MED ORDER — ROCURONIUM BROMIDE 100 MG/10ML IV SOLN
INTRAVENOUS | Status: DC | PRN
  Administered 2013-03-25: 5 mg via INTRAVENOUS
  Administered 2013-03-25 (×2): 10 mg via INTRAVENOUS
  Administered 2013-03-25: 30 mg via INTRAVENOUS
  Administered 2013-03-25: 10 mg via INTRAVENOUS
  Administered 2013-03-25: 5 mg via INTRAVENOUS
  Administered 2013-03-25: 10 mg via INTRAVENOUS
  Administered 2013-03-25: 5 mg via INTRAVENOUS

## 2013-03-25 MED ORDER — LIP MEDEX EX OINT
1.0000 "application " | TOPICAL_OINTMENT | Freq: Two times a day (BID) | CUTANEOUS | Status: DC
  Administered 2013-03-25 – 2013-04-02 (×14): 1 via TOPICAL
  Filled 2013-03-25 (×3): qty 7

## 2013-03-25 MED ORDER — BUPIVACAINE 0.25 % ON-Q PUMP DUAL CATH 300 ML
300.0000 mL | INJECTION | Status: DC
  Administered 2013-03-25: 300 mL
  Filled 2013-03-25: qty 300

## 2013-03-25 MED ORDER — ALUM & MAG HYDROXIDE-SIMETH 200-200-20 MG/5ML PO SUSP: 30.0000 mL | Freq: Four times a day (QID) | ORAL | Status: DC | PRN

## 2013-03-25 MED ORDER — SUCCINYLCHOLINE CHLORIDE 20 MG/ML IJ SOLN
INTRAMUSCULAR | Status: DC | PRN
  Administered 2013-03-25: 100 mg via INTRAVENOUS

## 2013-03-25 MED ORDER — SODIUM CHLORIDE 0.9 % IV SOLN
INTRAVENOUS | Status: AC
  Administered 2013-03-25: 17:00:00 via INTRAPERITONEAL
  Filled 2013-03-25: qty 6

## 2013-03-25 MED ORDER — BUPIVACAINE-EPINEPHRINE 0.25% -1:200000 IJ SOLN
INTRAMUSCULAR | Status: DC | PRN
  Administered 2013-03-25: 50 mL

## 2013-03-25 MED ORDER — MAGIC MOUTHWASH
15.0000 mL | Freq: Four times a day (QID) | ORAL | Status: DC | PRN
  Administered 2013-04-02: 15 mL via ORAL
  Filled 2013-03-25: qty 15

## 2013-03-25 MED ORDER — METRONIDAZOLE IN NACL 5-0.79 MG/ML-% IV SOLN
INTRAVENOUS | Status: AC
  Filled 2013-03-25: qty 100

## 2013-03-25 MED ORDER — LACTATED RINGERS IV SOLN
INTRAVENOUS | Status: DC | PRN
  Administered 2013-03-25 (×2): via INTRAVENOUS

## 2013-03-25 MED ORDER — LIDOCAINE HCL (CARDIAC) 20 MG/ML IV SOLN
INTRAVENOUS | Status: DC | PRN
  Administered 2013-03-25: 100 mg via INTRAVENOUS

## 2013-03-25 MED ORDER — PHENYLEPHRINE HCL 10 MG/ML IJ SOLN
10.0000 mg | INTRAVENOUS | Status: DC | PRN
  Administered 2013-03-25: 50 ug/min via INTRAVENOUS

## 2013-03-25 MED ORDER — PHENOL 1.4 % MT LIQD
2.0000 | OROMUCOSAL | Status: DC | PRN
  Filled 2013-03-25: qty 177

## 2013-03-25 MED ORDER — FENTANYL CITRATE 0.05 MG/ML IJ SOLN
INTRAMUSCULAR | Status: DC | PRN
  Administered 2013-03-25 (×4): 50 ug via INTRAVENOUS

## 2013-03-25 MED ORDER — CEFAZOLIN SODIUM-DEXTROSE 2-3 GM-% IV SOLR
2.0000 g | INTRAVENOUS | Status: AC
  Administered 2013-03-25: 2 g via INTRAVENOUS

## 2013-03-25 MED ORDER — DEXAMETHASONE SODIUM PHOSPHATE 10 MG/ML IJ SOLN
INTRAMUSCULAR | Status: DC | PRN
  Administered 2013-03-25: 10 mg via INTRAVENOUS

## 2013-03-25 MED ORDER — ETOMIDATE 2 MG/ML IV SOLN
INTRAVENOUS | Status: DC | PRN
  Administered 2013-03-25: 10 mg via INTRAVENOUS

## 2013-03-25 MED ORDER — HYDROMORPHONE HCL PF 1 MG/ML IJ SOLN: 0.2500 mg | INTRAMUSCULAR | Status: DC | PRN

## 2013-03-25 MED ORDER — 0.9 % SODIUM CHLORIDE (POUR BTL) OPTIME
TOPICAL | Status: DC | PRN
  Administered 2013-03-25: 2000 mL

## 2013-03-25 MED ORDER — MIDAZOLAM HCL 5 MG/5ML IJ SOLN
INTRAMUSCULAR | Status: DC | PRN
  Administered 2013-03-25 (×2): 1 mg via INTRAVENOUS

## 2013-03-25 MED ORDER — CHLORHEXIDINE GLUCONATE 0.12 % MT SOLN
15.0000 mL | Freq: Two times a day (BID) | OROMUCOSAL | Status: DC
  Administered 2013-03-25 – 2013-04-02 (×16): 15 mL via OROMUCOSAL
  Filled 2013-03-25 (×17): qty 15

## 2013-03-25 MED ORDER — MENTHOL 3 MG MT LOZG
1.0000 | LOZENGE | OROMUCOSAL | Status: DC | PRN
Start: 2013-03-25 — End: 2013-03-28
  Filled 2013-03-25: qty 9

## 2013-03-25 MED ORDER — GLYCOPYRROLATE 0.2 MG/ML IJ SOLN
INTRAMUSCULAR | Status: DC | PRN
  Administered 2013-03-25: .6 mg via INTRAVENOUS

## 2013-03-25 MED ORDER — IOHEXOL 300 MG/ML  SOLN
INTRAMUSCULAR | Status: DC | PRN
  Administered 2013-03-25: 10 mL via INTRAVENOUS

## 2013-03-25 MED ORDER — LACTATED RINGERS IR SOLN
Status: DC | PRN
  Administered 2013-03-25: 1

## 2013-03-25 MED ORDER — CARVEDILOL 3.125 MG PO TABS
3.1250 mg | ORAL_TABLET | Freq: Two times a day (BID) | ORAL | Status: DC
  Administered 2013-03-27 – 2013-03-31 (×9): 3.125 mg via ORAL
  Filled 2013-03-25 (×16): qty 1

## 2013-03-25 MED ORDER — CEFAZOLIN SODIUM-DEXTROSE 2-3 GM-% IV SOLR
INTRAVENOUS | Status: AC
  Filled 2013-03-25: qty 50

## 2013-03-25 MED ORDER — METRONIDAZOLE IN NACL 5-0.79 MG/ML-% IV SOLN
500.0000 mg | Freq: Three times a day (TID) | INTRAVENOUS | Status: AC
  Administered 2013-03-25 – 2013-03-28 (×9): 500 mg via INTRAVENOUS
  Filled 2013-03-25 (×9): qty 100

## 2013-03-25 MED ORDER — PROMETHAZINE HCL 25 MG/ML IJ SOLN: 6.2500 mg | INTRAMUSCULAR | Status: DC | PRN

## 2013-03-25 MED ORDER — LACTATED RINGERS IV BOLUS (SEPSIS): 1000.0000 mL | Freq: Three times a day (TID) | INTRAVENOUS | Status: AC | PRN

## 2013-03-25 MED ORDER — BUPIVACAINE 0.25 % ON-Q PUMP DUAL CATH 300 ML
300.0000 mL | INJECTION | Status: DC
  Filled 2013-03-25: qty 300

## 2013-03-25 MED ORDER — CEFAZOLIN SODIUM-DEXTROSE 2-3 GM-% IV SOLR
2.0000 g | Freq: Three times a day (TID) | INTRAVENOUS | Status: AC
  Administered 2013-03-25 – 2013-03-28 (×9): 2 g via INTRAVENOUS
  Filled 2013-03-25 (×11): qty 50

## 2013-03-25 SURGICAL SUPPLY — 61 items
APPLIER CLIP 5 13 M/L LIGAMAX5 (MISCELLANEOUS) ×2
BINDER ABD UNIV 12 45-62 (WOUND CARE) ×1 IMPLANT
BINDER ABDOMINAL 46IN 62IN (WOUND CARE) ×2
BLADE HEX COATED 2.75 (ELECTRODE) ×2 IMPLANT
CABLE HI FREQUENCY MONOPOLAR (ELECTROSURGICAL) ×2 IMPLANT
CANISTER SUCTION 2500CC (MISCELLANEOUS) ×2 IMPLANT
CATH KIT ON Q 7.5IN SLV (PAIN MANAGEMENT) ×4 IMPLANT
CHLORAPREP W/TINT 26ML (MISCELLANEOUS) ×2 IMPLANT
CLIP APPLIE 5 13 M/L LIGAMAX5 (MISCELLANEOUS) ×1 IMPLANT
CLOSURE STERI-STRIP 1/4X4 (GAUZE/BANDAGES/DRESSINGS) ×4 IMPLANT
COVER MAYO STAND STRL (DRAPES) ×2 IMPLANT
DECANTER SPIKE VIAL GLASS SM (MISCELLANEOUS) ×2 IMPLANT
DEVICE SECURE STRAP 25 ABSORB (INSTRUMENTS) ×2 IMPLANT
DEVICE TROCAR PUNCTURE CLOSURE (ENDOMECHANICALS) ×2 IMPLANT
DRAPE C-ARM 42X120 X-RAY (DRAPES) ×2 IMPLANT
DRAPE LAPAROSCOPIC ABDOMINAL (DRAPES) ×2 IMPLANT
DRAPE UTILITY XL STRL (DRAPES) ×2 IMPLANT
DRAPE WARM FLUID 44X44 (DRAPE) ×2 IMPLANT
DRSG TEGADERM 2-3/8X2-3/4 SM (GAUZE/BANDAGES/DRESSINGS) ×10 IMPLANT
DRSG TEGADERM 4X4.75 (GAUZE/BANDAGES/DRESSINGS) ×6 IMPLANT
ELECT REM PT RETURN 9FT ADLT (ELECTROSURGICAL) ×2
ELECTRODE REM PT RTRN 9FT ADLT (ELECTROSURGICAL) ×1 IMPLANT
ENDOLOOP SUT PDS II  0 18 (SUTURE) ×1
ENDOLOOP SUT PDS II 0 18 (SUTURE) ×1 IMPLANT
GAUZE SPONGE 2X2 8PLY STRL LF (GAUZE/BANDAGES/DRESSINGS) ×1 IMPLANT
GAUZE SPONGE 4X4 16PLY XRAY LF (GAUZE/BANDAGES/DRESSINGS) ×2 IMPLANT
GLOVE BIOGEL PI IND STRL 7.0 (GLOVE) ×4 IMPLANT
GLOVE BIOGEL PI IND STRL 7.5 (GLOVE) ×2 IMPLANT
GLOVE BIOGEL PI IND STRL 8 (GLOVE) ×2 IMPLANT
GLOVE BIOGEL PI INDICATOR 7.0 (GLOVE) ×4
GLOVE BIOGEL PI INDICATOR 7.5 (GLOVE) ×2
GLOVE BIOGEL PI INDICATOR 8 (GLOVE) ×2
GLOVE ECLIPSE 8.0 STRL XLNG CF (GLOVE) ×2 IMPLANT
GLOVE INDICATOR 8.0 STRL GRN (GLOVE) ×2 IMPLANT
GOWN STRL NON-REIN LRG LVL3 (GOWN DISPOSABLE) ×2 IMPLANT
GOWN STRL REIN XL XLG (GOWN DISPOSABLE) ×16 IMPLANT
IV CATH 14GX2 1/4 (CATHETERS) ×2 IMPLANT
KIT BASIN OR (CUSTOM PROCEDURE TRAY) ×2 IMPLANT
MARKER SKIN DUAL TIP RULER LAB (MISCELLANEOUS) ×2 IMPLANT
MESH VENTRALIGHT ST 7X9N (Mesh General) ×2 IMPLANT
NEEDLE SPNL 22GX3.5 QUINCKE BK (NEEDLE) ×2 IMPLANT
PENCIL BUTTON HOLSTER BLD 10FT (ELECTRODE) IMPLANT
POUCH SPECIMEN RETRIEVAL 10MM (ENDOMECHANICALS) ×2 IMPLANT
SCALPEL HARMONIC ACE (MISCELLANEOUS) IMPLANT
SCISSORS LAP 5X35 DISP (ENDOMECHANICALS) ×2 IMPLANT
SET CHOLANGIOGRAPH MIX (MISCELLANEOUS) ×2 IMPLANT
SET IRRIG TUBING LAPAROSCOPIC (IRRIGATION / IRRIGATOR) ×2 IMPLANT
SLEEVE XCEL OPT CAN 5 100 (ENDOMECHANICALS) ×8 IMPLANT
SPONGE GAUZE 2X2 STER 10/PKG (GAUZE/BANDAGES/DRESSINGS) ×1
STRIP CLOSURE SKIN 1/2X4 (GAUZE/BANDAGES/DRESSINGS) ×4 IMPLANT
SUT MNCRL AB 4-0 PS2 18 (SUTURE) ×2 IMPLANT
SUT PDS AB 1 CTX 36 (SUTURE) ×6 IMPLANT
SUT PROLENE 1 CT 1 30 (SUTURE) ×14 IMPLANT
TOWEL OR 17X26 10 PK STRL BLUE (TOWEL DISPOSABLE) ×2 IMPLANT
TOWEL OR NON WOVEN STRL DISP B (DISPOSABLE) ×2 IMPLANT
TRAY FOLEY CATH 14FRSI W/METER (CATHETERS) IMPLANT
TRAY LAP CHOLE (CUSTOM PROCEDURE TRAY) ×2 IMPLANT
TROCAR BLADELESS OPT 5 100 (ENDOMECHANICALS) ×2 IMPLANT
TROCAR XCEL NON-BLD 11X100MML (ENDOMECHANICALS) ×2 IMPLANT
TUBING INSUFFLATION 10FT LAP (TUBING) ×2 IMPLANT
TUNNELER SHEATH ON-Q 16GX12 DP (PAIN MANAGEMENT) ×2 IMPLANT

## 2013-03-25 NOTE — Anesthesia Postprocedure Evaluation (Signed)
  Anesthesia Post-op Note  Patient: [name]  Procedure(s) Performed: Procedure(s) (LRB): LAPAROSCOPIC VENTRAL HERNIA REPAIR  (N/A) LAPAROSCOPIC CHOLECYSTECTOMY WITH INTRAOPERATIVE CHOLANGIOGRAM (N/A) INSERTION OF MESH (N/A) LAPAROSCOPIC LYSIS OF ADHESIONS (N/A) PARTIAL OMENTECTOMY (N/A)  Patient Location: PACU  Anesthesia Type: General  Level of Consciousness: awake and alert   Airway and Oxygen Therapy: Patient Spontanous Breathing  Post-op Pain: mild  Post-op Assessment: Post-op Vital signs reviewed, Patient's Cardiovascular Status Stable, Respiratory Function Stable, Patent Airway and No signs of Nausea or vomiting  Last Vitals:  Filed Vitals:   03/25/13 1815  BP: 105/46  Pulse: 68  Temp:   Resp: 14    Post-op Vital Signs: stable   Complications: No apparent anesthesia complications

## 2013-03-25 NOTE — Progress Notes (Signed)
Urine output in PACU 20 cc; Dr. Okey Dupre notified, orders rec'd, will get labs drawn prior to transfer to ICU

## 2013-03-25 NOTE — Preoperative (Signed)
Beta Blockers   Reason not to administer Beta Blockers:Hold beta blocker due to hypotension, will hold until after induction of general anesthesia. Will give beta blocker if needed

## 2013-03-25 NOTE — Progress Notes (Signed)
[name] [id number] 1924-11-15  CARE TEAM:  PCP: Lauren Salisbury, MD  Outpatient Care Team: Patient Care Team: Lauren Salisbury, MD as PCP - General  Inpatient Treatment Team: Treatment Team: Attending Provider: Alison Murray, MD; Registered Nurse: Rayburn Ma, RN; Consulting Physician: Bishop Limbo, MD; Registered Nurse: Hewitt Blade, RN; Consulting Physician: Kathleene Hazel, MD; Rounding Team: Jessie Foot, MD; Registered Nurse: Brantley Persons, RN   Subjective:  Pt c/o sore throat Soreness mild periumbilical - has had a lump there "for years"  Objective:  Vital signs:  Filed Vitals:   03/25/13 0200 03/25/13 0400 03/25/13 0500 03/25/13 0600  BP: 96/70 135/59  131/65  Pulse: 72 69  71  Temp:  97.9 F (36.6 C)    TempSrc:  Oral    Resp: 15 15  14   Height:      Weight:   171 lb 4.8 oz (77.7 kg)   SpO2: 100% 99%  100%    Last BM Date: 03/21/13  Intake/Output   Yesterday:  11/02 0701 - 11/03 0700 In: 3439.1 [I.V.:2869.1; NG/GT:20; IV Piggyback:550] Out: 1530 [Urine:1130; Emesis/NG output:400] This shift:     Bowel function:  Flatus: n  BM: n  Drain: Brown thin NGT output  Physical Exam:  General: Pt awake/alert/oriented x4 in no acute distress Eyes: PERRL, normal EOM.  Sclera clear.  No icterus Neuro: CN II-XII intact w/o focal sensory/motor deficits. Lymph: No head/neck/groin lymphadenopathy Psych:  No delerium/psychosis/paranoia HENT: Normocephalic, Mucus membranes moist.  NGT in place.  No thrush Neck: Supple, No tracheal deviation Chest: Clear anteriorly w fair resp effort.  No chest wall pain w good excursion CV:  Pulses intact.  Regular rhythm MS: Normal AROM mjr joints.  No obvious deformity  Abdomen: Soft.  Obese.  Nondistended.  Mildly tender at firm incarcerated infraumb & umb hernias with deep palpation only.  No erythema/suppuration.  No evidence of peritonitis.    Ext:  SCDs BLE.  No mjr edema.  No  cyanosis Skin: No petechiae / purpura   Problem List:   Principal Problem:   N&V (nausea and vomiting) Active Problems:   HYPOTHYROIDISM   ANXIETY   Acute renal failure   Atrial fibrillation with RVR   Porcelain gallbladder   Incarcerated incisional hernia   NSTEMI (non-ST elevated myocardial infarction)   Moderate protein-calorie malnutrition   Anemia of chronic disease   Acute respiratory failure with hypoxia   Protein-calorie malnutrition, severe   SBO (small bowel obstruction)   Incisional hernia, incarcerated   Assessment  [name]  79 y.o. female       Persistent SBO presumed at SB incarcerated within infraumbilical hernia  Non-STEMI likely secondary to demand ischemia and rapid atrial fibrillation  Incidental porcelain gallbladder   Plan:  DIFFICULT SITUATION  Standard of care would be reduction & repair of hernias.  Dx lap with probable combined open approach.  Mesh would reduce recurrence but more risk of infection in emergent setting & esp if SB resection needed.  Would use biologic mesh reinforcement in that case:  The anatomy & physiology of the abdominal wall was discussed.  The pathophysiology of hernias was discussed.  Natural history risks without surgery including progeressive enlargement, pain, incarceration & strangulation was discussed.   Contributors to complications such as smoking, obesity, diabetes, prior surgery, etc were discussed.   I feel the risks of no intervention will lead to serious problems that outweigh the operative risks; therefore, I recommended surgery to reduce and repair  the hernia.  I explained laparoscopic techniques with possible need for an open approach.  I noted the probable use of mesh to patch and/or buttress the hernia repair.  Risks such as bleeding, infection, abscess, need for further treatment, heart attack, death, and other risks were discussed.  I noted a good likelihood this will help address the problem.    Goals of post-operative recovery were discussed as well.  Possibility that this will not correct all symptoms was explained.  I stressed the importance of low-impact activity, aggressive pain control, avoiding constipation, & not pushing through pain to minimize risk of post-operative chronic pain or injury. Possibility of reherniation especially with smoking, obesity, diabetes, immunosuppression, and other health conditions was discussed.  We will work to minimize complications.     Possible concurrent cholecystectomy with porcelain gallbladder:    The anatomy & physiology of hepatobiliary & pancreatic function was discussed.  The pathophysiology of gallbladder dysfunction was discussed.  Natural history risks without surgery was discussed.   I feel the risks of no intervention will lead to serious problems that outweigh the operative risks; therefore, I recommended cholecystectomy to remove the pathology.  I explained laparoscopic techniques with possible need for an open approach.  Probable cholangiogram to evaluate the bilary tract was explained as well.    Risks such as bleeding, infection, abscess, leak, injury to other organs, need for further treatment, heart attack, death, and other risks were discussed.  I noted a good likelihood this will help address the problem.  Possibility that this will not correct all abdominal symptoms was explained.  Goals of post-operative recovery were discussed as well.  We will work to minimize complications.  An educational handout further explaining the pathology and treatment options was given as well.  Questions were answered.  The patient expresses understanding.  The biggest barrier to surgery is the MI this admission.  Risk of periop complications esp perioperative death much higher.  However, I am skeptical that the SBO will resolve until the hernia is reduced & this is not possible at the bedside.  I will defer to cardiology's imput as to how to optimize pt  for surgery - I doubt that she can wait obstructed for the next 3-12 months...  -VTE prophylaxis- SCDs, etc -amiodarone for Afib. -anemia - per medicine work up -mobilize as tolerated to help recovery  Ardeth Sportsman, M.D., F.A.C.S. Gastrointestinal and Minimally Invasive Surgery [facility] [address] [place], [place] [address] [phone] Main / Paging   03/25/2013   Results:   Labs: Results for orders placed during the hospital encounter of 03/22/13 (from the past 48 hour(s))  TROPONIN I     Status: Abnormal   Collection Time    03/23/13  1:50 PM      Result Value Range   Troponin I 0.62 (*) <0.30 ng/mL   Comment:            Due to the release kinetics of cTnI,     a negative result within the first hours     of the onset of symptoms does not rule out     myocardial infarction with certainty.     If myocardial infarction is still suspected,     repeat the test at appropriate intervals.     CRITICAL VALUE NOTED.  VALUE IS CONSISTENT WITH PREVIOUSLY REPORTED AND CALLED VALUE.  COMPREHENSIVE METABOLIC PANEL     Status: Abnormal   Collection Time  03/24/13  4:52 AM      Result Value Range   Sodium 140  135 - 145 mEq/L   Potassium 2.9 (*) 3.5 - 5.1 mEq/L   Comment: DELTA CHECK NOTED     REPEATED TO VERIFY   Chloride 111  96 - 112 mEq/L   CO2 18 (*) 19 - 32 mEq/L   Glucose, Bld 112 (*) 70 - 99 mg/dL   BUN 12  6 - 23 mg/dL   Creatinine, Ser 1.61  0.50 - 1.10 mg/dL   Calcium 8.4  8.4 - 09.6 mg/dL   Total Protein 4.5 (*) 6.0 - 8.3 g/dL   Albumin 2.2 (*) 3.5 - 5.2 g/dL   AST 11  0 - 37 U/L   ALT 7  0 - 35 U/L   Alkaline Phosphatase 51  39 - 117 U/L   Total Bilirubin 0.4  0.3 - 1.2 mg/dL   GFR calc non Af Amer 53 (*) >90 mL/min   GFR calc Af Amer 61 (*) >90 mL/min   Comment: (NOTE)     The eGFR has been calculated using the CKD EPI equation.     This calculation has not been validated in all clinical situations.     eGFR's  persistently <90 mL/min signify possible Chronic Kidney     Disease.  CBC     Status: Abnormal   Collection Time    03/24/13  4:52 AM      Result Value Range   WBC 5.0  4.0 - 10.5 K/uL   RBC 2.88 (*) 3.87 - 5.11 MIL/uL   Hemoglobin 8.8 (*) 12.0 - 15.0 g/dL   HCT 04.5 (*) 40.9 - 81.1 %   MCV 92.7  78.0 - 100.0 fL   MCH 30.6  26.0 - 34.0 pg   MCHC 33.0  30.0 - 36.0 g/dL   RDW 91.4 (*) 78.2 - 95.6 %   Platelets 189  150 - 400 K/uL   Comment: SPECIMEN CHECKED FOR CLOTS     DELTA CHECK NOTED  MAGNESIUM     Status: Abnormal   Collection Time    03/24/13  4:52 AM      Result Value Range   Magnesium 1.3 (*) 1.5 - 2.5 mg/dL  PHOSPHORUS     Status: None   Collection Time    03/24/13  4:52 AM      Result Value Range   Phosphorus 3.0  2.3 - 4.6 mg/dL  PROCALCITONIN     Status: None   Collection Time    03/25/13  5:15 AM      Result Value Range   Procalcitonin <0.10     Comment:            Interpretation:     PCT (Procalcitonin) <= 0.5 ng/mL:     Systemic infection (sepsis) is not likely.     Local bacterial infection is possible.     (NOTE)             ICU PCT Algorithm               Non ICU PCT Algorithm        ----------------------------     ------------------------------             PCT < 0.25 ng/mL                 PCT < 0.1 ng/mL         Stopping of antibiotics  Stopping of antibiotics           strongly encouraged.               strongly encouraged.        ----------------------------     ------------------------------           PCT level decrease by               PCT < 0.25 ng/mL           >= 80% from peak PCT           OR PCT 0.25 - 0.5 ng/mL          Stopping of antibiotics                                                 encouraged.         Stopping of antibiotics               encouraged.        ----------------------------     ------------------------------           PCT level decrease by              PCT >= 0.25 ng/mL           < 80% from peak PCT             AND PCT >= 0.5 ng/mL            Continuing antibiotics                                                  encouraged.           Continuing antibiotics                encouraged.        ----------------------------     ------------------------------         PCT level increase compared          PCT > 0.5 ng/mL             with peak PCT AND              PCT >= 0.5 ng/mL             Escalation of antibiotics                                              strongly encouraged.          Escalation of antibiotics            strongly encouraged.  BASIC METABOLIC PANEL     Status: Abnormal   Collection Time    03/25/13  5:15 AM      Result Value Range   Sodium 140  135 - 145 mEq/L   Potassium 3.1 (*) 3.5 - 5.1 mEq/L   Chloride 112  96 - 112 mEq/L   CO2 18 (*) 19 - 32 mEq/L   Glucose, Bld 87  70 - 99 mg/dL   BUN 9  6 -  23 mg/dL   Creatinine, Ser 4.69  0.50 - 1.10 mg/dL   Calcium 8.4  8.4 - 62.9 mg/dL   GFR calc non Af Amer 59 (*) >90 mL/min   GFR calc Af Amer 69 (*) >90 mL/min   Comment: (NOTE)     The eGFR has been calculated using the CKD EPI equation.     This calculation has not been validated in all clinical situations.     eGFR's persistently <90 mL/min signify possible Chronic Kidney     Disease.    Imaging / Studies: No results found.  Medications / Allergies: per chart  Antibiotics: Anti-infectives   None

## 2013-03-25 NOTE — Progress Notes (Addendum)
[facility] PROGRESS NOTE  [name] UJW:[id number] DOB: 08-27-1924 DOA: 03/22/2013 PCP: Nelwyn Salisbury, MD  Brief narrative: 79 year old female with past medical history of ulcerative colitis, CMV colitis, hypertension, hypothyroidism, anxiety who presented to [facility] ED 03/22/2013 with complaints of intractable nausea and vomiting associated with poor oral intake over past 1 week prior to this admission.  In ED, vital signs were as follows: BP 70/35, HR 39-144 and T max 98.3 F. Oxygen saturation was 84% on room air. BP has improved with initial IV fluids (1.5 L NS) to 143/80 but then bounced back to 88/51.Patient however did not require pressor support. In addition, CT abdomen revealed incarcerated hernia adn loop of small bowel within the hernia for which reason surgery was consulted. Hospital course is complicated with additional problem of A fib with RVR and NSTEMI. With digoxin and amiodarone pt has converted to NSR and per cardiology only amiodarone is needed at this time.  Assessment/Plan:   Principal Problem:  N&V (nausea and vomiting) in the setting of incarcerated hernia and small bowel within the hernia  - likely related to loop of small bowel within the incarcerated hernia seen on CT abdomen  - pt still has NG tube - appreciate surgery following and their recommendations   Active Problems:  Atrial fibrillation with RVR  - appreciate cardiology following  - pt was started on amiodarone and digoxin per cardio recommendations. Pt converted to NSR and now only on amiodarone - heart rate regular  NSTEMI (non-ST elevated myocardial infarction)  - cardiac enzymes trended down, 1.23 --> 0.62  - 2 D ECHO on this admission - EF 30% to 35%; akinesisof the entire apical myocardium; severe hypokinesis of the mid-distalanteroseptal, anterolateral and inferior myocardium; grade 1 diastolic dysfunction  - cardio is following  Acute renal failure  - likely due to GI losses, dehydration,  pre-renal etiology  - renal function WNL  Hypokalemia  - repleted with IV potassium daily (on 11/2 6 IV K runs and today 3 IV K runs) Anemia of chronic disease  - secondary to history of thyroid disease  - hemoglobin 10.4 --> 9.7 --> 8.8  Acute hypoxic respiratory failure  - unclear etiology  - CXR did not reveal acute cardiopulmonary porcess  - resp status stable; O2 saturation 100% on 2 L nasal canula  HYPOTHYROIDISM  - continue synthroid IV  ANXIETY  - ativan ordered IV PRN at bedtime  Incarcerated incisional hernia  - surgery is following  Moderate protein-calorie malnutrition  - has NG tube  - may improve once pt resumes oral intake   Code Status: full code  Family Communication: no family at the bedside  Disposition Plan: remains inpatient   Manson Passey, MD  [facility]  Pager [phone]   Consultants:  Cardiology  Surgery Procedures:  NG tube placement 03/23/2013 Antibiotics:  None    If 7PM-7AM, please contact night-coverage [email or web address] Password [email or web address] 03/25/2013, 8:16 AM   LOS: 3 days    HPI/Subjective: No acute overnight events.  Objective: Filed Vitals:   03/25/13 0200 03/25/13 0400 03/25/13 0500 03/25/13 0600  BP: 96/70 135/59  131/65  Pulse: 72 69  71  Temp:  97.9 F (36.6 C)    TempSrc:  Oral    Resp: 15 15  14   Height:      Weight:   77.7 kg (171 lb 4.8 oz)   SpO2: 100% 99%  100%    Intake/Output Summary (Last 24 hours) at 03/25/13 6578 Last data filed  at 03/25/13 0600  Gross per 24 hour  Intake 3227.4 ml  Output   1380 ml  Net 1847.4 ml    Exam:   General:  Pt is alert, follows commands appropriately, not in acute distress  Cardiovascular: Regular rate and rhythm, S1/S2 appreciated  Respiratory: Clear to auscultation bilaterally, no wheezing, no crackles, no rhonchi  Abdomen: Soft, non tender, non distended, bowel sounds present, hernia in left-mid abd  Extremities: No edema, pulses DP and PT palpable  bilaterally  Neuro: Grossly nonfocal  Data Reviewed: Basic Metabolic Panel:  Recent Labs Lab 03/22/13 1450 03/23/13 0245 03/24/13 0452 03/25/13 0515  NA 136 137 140 140  K 3.8 3.7 2.9* 3.1*  CL 100 104 111 112  CO2 16* 16* 18* 18*  GLUCOSE 157* 127* 112* 87  BUN 24* 21 12 9   CREATININE 1.25* 1.15* 0.95 0.86  CALCIUM 9.5 8.4 8.4 8.4  MG  --   --  1.3*  --   PHOS  --   --  3.0  --    Liver Function Tests:  Recent Labs Lab 03/22/13 1450 03/24/13 0452  AST 19 11  ALT 9 7  ALKPHOS 65 51  BILITOT 0.8 0.4  PROT 5.8* 4.5*  ALBUMIN 2.8* 2.2*    Recent Labs Lab 03/22/13 1450  LIPASE 15   No results found for this basename: AMMONIA,  in the last 168 hours CBC:  Recent Labs Lab 03/22/13 1450 03/23/13 0245 03/24/13 0452  WBC 2.8* 4.6 5.0  NEUTROABS 0.8*  --   --   HGB 10.4* 9.7* 8.8*  HCT 30.5* 28.9* 26.7*  MCV 89.2 90.6 92.7  PLT 257 258 189   Cardiac Enzymes:  Recent Labs Lab 03/22/13 1450 03/22/13 1909 03/23/13 0245 03/23/13 0750 03/23/13 1350  TROPONINI 0.87* 1.87* 1.23* 0.84* 0.62*   BNP: No components found with this basename: POCBNP,  CBG: No results found for this basename: GLUCAP,  in the last 168 hours  Recent Results (from the past 240 hour(s))  MRSA PCR SCREENING     Status: None   Collection Time    03/22/13  8:51 PM      Result Value Range Status   MRSA by PCR NEGATIVE  NEGATIVE Final   Comment:            The GeneXpert MRSA Assay (FDA     approved for NASAL specimens     only), is one component of a     comprehensive MRSA colonization     surveillance program. It is not     intended to diagnose MRSA     infection nor to guide or     monitor treatment for     MRSA infections.     Studies: No results found.  Scheduled Meds: . atorvastatin  20 mg Oral Q supper  . [START ON 04/06/2013] cyanocobalamin  1,000 mcg Intramuscular Q30 days  . levothyroxine  62.5 mcg Intravenous Daily  . lip balm  1 application Topical BID  .  multivitamin with minerals  1 tablet Oral Daily  . mycophenolate  1,000 mg Oral BID  . pantoprazole (PROTONIX) IV  40 mg Intravenous Q12H  . potassium chloride  10 mEq Intravenous Q1 Hr x 4  . sodium chloride  3 mL Intravenous Q12H   Continuous Infusions: . sodium chloride 125 mL/hr at 03/25/13 0551  . amiodarone (NEXTERONE PREMIX) 360 mg/200 mL dextrose 30 mg/hr (03/25/13 0213)

## 2013-03-25 NOTE — Transfer of Care (Signed)
Immediate Anesthesia Transfer of Care Note  Patient: [name]  Procedure(s) Performed: Procedure(s): LAPAROSCOPIC VENTRAL HERNIA REPAIR  (N/A) LAPAROSCOPIC CHOLECYSTECTOMY WITH INTRAOPERATIVE CHOLANGIOGRAM (N/A) INSERTION OF MESH (N/A) LAPAROSCOPIC LYSIS OF ADHESIONS (N/A) PARTIAL OMENTECTOMY (N/A)  Patient Location: PACU  Anesthesia Type:General  Level of Consciousness: awake and alert   Airway & Oxygen Therapy: Patient Spontanous Breathing and Patient connected to face mask oxygen  Post-op Assessment: Report given to PACU RN and Post -op Vital signs reviewed and stable  Post vital signs: Reviewed and stable  Complications: No apparent anesthesia complications

## 2013-03-25 NOTE — Progress Notes (Signed)
Pt sore at umbilcus.  D/w Dr Eden Emms w cardiology.  Not ideal to operate on the patient at this time with a cardiac stress, that she is stable she's going to get for quite a while. Increased operative risk, but he agrees patient is not going to improve bowel obstruction or hernia until gets operated on for it.  Discuss with the patient at length. I then called and discussed with her daughter, [name], and the presence of the intensive care unit nurse, Lesia Sago. I think she require surgery today while her cardiac and palmar status are stable. Increased risks of heart attack and death, but worried that she has at the very least ischemic if not necrotic small intestine into it now. The patient wishes to be aggressive in pursuing surgery. She wishes to have the porcelain gallbladder removed also in there is not any increased perioperative risk. I noted that as long as things go smoothly, we'll try proceed with cholecystectomy as well.  Her daughter agrees.

## 2013-03-25 NOTE — Op Note (Addendum)
03/22/2013 - 03/25/2013  5:32 PM  PATIENT:  [name]  79 y.o. female  Patient Care Team: Nelwyn Salisbury, MD as PCP - General  PRE-OPERATIVE DIAGNOSIS:  incarcerated hernia   POST-OPERATIVE DIAGNOSIS:  incarcerated incisional hernia (omentum > ileum) Porcelain gallbladder  PROCEDURE:  Procedure(s): LAPAROSCOPIC VENTRAL HERNIA REPAIR  LAPAROSCOPIC CHOLECYSTECTOMY WITH INTRAOPERATIVE CHOLANGIOGRAM INSERTION OF MESH LAPAROSCOPIC LYSIS OF ADHESIONS PARTIAL OMENTECTOMY  SURGEON:  Surgeon(s): Ardeth Sportsman, MD  ASSISTANT: Bhavinkumar "Vin" Bhagat, PA student, [employer]   ANESTHESIA:   local and general  EBL:  Total I/O In: 1375.1 [I.V.:1175.1; IV Piggyback:200] Out: 360 [Urine:260; Blood:100]  Delay start of Pharmacological VTE agent (>24hrs) due to surgical blood loss or risk of bleeding:  no  DRAINS: none   SPECIMEN:  Source of Specimen:  Gallbladder & omentum  DISPOSITION OF SPECIMEN:  PATHOLOGY  COUNTS:  YES  PLAN OF CARE: Admit to inpatient   PATIENT DISPOSITION:  ICU - extubated and stable.  INDICATION: Pleasant patient has developed a ventral wall abdominal hernia.   Recommendation was made for surgical repair:  The anatomy & physiology of the abdominal wall was discussed. The pathophysiology of hernias was discussed. Natural history risks without surgery including progeressive enlargement, pain, incarceration & strangulation was discussed. Contributors to complications such as smoking, obesity, diabetes, prior surgery, etc were discussed.  I feel the risks of no intervention will lead to serious problems that outweigh the operative risks; therefore, I recommended surgery to reduce and repair the hernia. I explained laparoscopic techniques with possible need for an open approach. I noted the probable use of mesh to patch and/or buttress the hernia repair  Risks such as bleeding, infection, abscess, need for further treatment, heart attack,  death, and other risks were discussed. I noted a good likelihood this will help address the problem. Goals of post-operative recovery were discussed as well. Possibility that this will not correct all symptoms was explained. I stressed the importance of low-impact activity, aggressive pain control, avoiding constipation, & not pushing through pain to minimize risk of post-operative chronic pain or injury. Possibility of reherniation especially with smoking, obesity, diabetes, immunosuppression, and other health conditions was discussed. We will work to minimize complications.  An educational handout further explaining the pathology & treatment options was given as well. Questions were answered. The patient expresses understanding & wishes to proceed with surgery.   OR FINDINGS: a moderately large volume of greater omentum incarcerated in infraumbilical incisional hernia. Umbilical hernia incarcerated with omentum. Short segment of ileum incarcerated and hernia. Not ischemic/necrotic.  Swiss cheese type umbilical/infraumbilical ventral hernias x4. 11 x 5 cm region.   Type of repair - Laparoscopic underlay repair   Name of mesh - Bard Ventralex dual sided (polypropylene / Seprafilm)  Size of mesh - Length 23 cm, Width 17 cm  Mesh overlap - 5-7 cm  Placement of mesh - Intraperitoneal underlay repair   DESCRIPTION:   Informed consent was confirmed. The patient underwent general anaesthesia without difficulty. The patient was positioned appropriately. VTE prevention in place. The patient's abdomen was clipped, prepped, & draped in a sterile fashion. Surgical timeout confirmed our plan.  I could not reduce the infraumbilical ventral hernia.  The patient was positioned in reverse Trendelenburg. Abdominal entry was gained using optical entry technique in the right upper abdomen. Entry was clean. I induced carbon dioxide insufflation. Camera inspection revealed no injury. Extra ports were carefully  placed under direct laparoscopic visualization.   I could see the hernia  in the central abdomen.   I did laparoscopic lysis of adhesions to expose the entire anterior abdominal wall.  I primarily used and focused cold scissors.    I started with freeing off the rim of adhesions. With that I could reduce the umbilical hernia. I could reduce some of the omentum out of the infraumbilical Swiss cheese type hernia. The small bowel came down easily. It was at the distal ileum with a mesenteric adhesion folded to itself. I laparoscopically freed off and straightened the bowel out. This was the obvious transition point the bowel obstruction. Distally decompressed. It was within a foot of the ileocecal valve. I gradually reduced down a moderate volume of greater omentum, having a sharply free up adhesions to the subcutaneous tissues and fascia appear. The distal greater omentum did have some edema and was a little beaten up, but there was no foul odor or pus or necrosis or phlegmon.  I resected the distal half of the greater omentum that looked somewhat beaten up and thinned out.  I made sure hemostasis was good.  Figured it copiously with saline. I did a final irrigation with antibiotics solution (clindamycin/gentamicin).  I allowed that to sit while we turned towards cholecystectomy.  Because there was no frank necrosis, and proceeded with cholecystectomy as well.  I turned attention to the right upper quadrant.  There is some greater omentum adherent to the gallbladder. I resected part of it off leaving a cuff of greater omentum to grab the gallbladder.  The gallbladder itself was a hard rock.  The gallbladder fundus was elevated cephalad. I used hook cautery to free the peritoneal coverings between the gallbladder and the liver on the posteriolateral and anteriomedial walls.   I used careful blunt and hook dissection to help get a good critical view of the cystic artery and cystic duct. I did further dissection to  free a few centimeters of the  gallbladder off the liver bed to get a good critical view of the infundibulum and cystic duct. I mobilized the cystic artery; and, after getting a good 360 view, ligated the cystic artery using clips. I skeletonized the cystic duct.  I placed a clip on the infundibulum. I did a partial cystic duct-otomy and ensured patency. I placed a 5 [place] cholangiocatheter through a puncture site at the right subcostal ridge of the abdominal wall and directed it into the cystic duct.  We ran a cholangiogram with dilute radio-opaque contrast and continuous fluoroscopy. Contrast flowed from a side branch consistent with cystic duct cannulization. Contrast flowed up the common hepatic duct into the right and left intrahepatic chains out to secondary radicals. Contrast flowed down the common bile duct easily across the normal ampulla into the duodenum.  The biliary system was narrow but overall normal.  This was consistent with a normal cholangiogram.  I removed the cholangiocatheter. I placed clips on the cystic duct x4.  I completed cystic duct transection. I freed the gallbladder from its remaining attachments to the liver. I ensured hemostasis on the gallbladder fossa of the liver and elsewhere. I inspected the rest of the abdomen & detected no injury nor bleeding elsewhere.  I removed the gallbladder inside an Endo Catch bag.   I reinspected the intestine and omentum. They were viable. I decided to return attention to repair of the hernia.  I mapped out the region using a needle passer.   To ensure that I would have at least 5 cm radial coverage outside of the  hernia defect, I chose a 23x17 cm dual sided mesh.  I placed #1 Prolene stitches around its edge about every 5 cm = 14 total.  I rolled the mesh & placed into the peritoneal cavity through the umbilical hernia fascial defect.  I unrolled the mesh and positioned it appropriately.  I secured the mesh to cover up the hernia defect  using a laparoscopic suture passer to pass the tails of the Prolene through the abdominal wall & tagged them with clamps.  I started out in four corners to make sure I had the mesh centered over the hernia defect appropriately, and then proceeded to work in quadrants.  We evacuated CO2 & desufflated the abdomen.  I tied the fascial stitches down.  I reinsufflated the abdomen.  The mesh provided at least 5-10 cm circumferential coverage around the entire region of hernia defects.   I tacked the edges & central part of the mesh to the peritoneum/posterior rectus fascia with SecureStrap absorbable tacks.   Hemostasis was excellent.  Aspirated the antibiotic solution.  I closed the fascia umbilical hernia side transversely using interrupted #1 PDS stitches. I then placed On-Q catheter sheaths in the preperitoneal plane under direct laparoscopic guidance from the subxiphoid region down to the posterior iliac crests in the lower flanks. I did reinspection. Hemostasis was good. Mesh laid well. Capnoperitoneum was evacuated. Ports were removed. The skin was closed with Monocryl at the port sites and Steri-Strips on the fascial stitch puncture sites. I had to cut out some excess umbilical skin.  OnQ catheters were placed and the sheathes peeled away. On-Q pump was secured. Patient is being extubated to go to the recovery room. Per the daughter's request, I discussed with the patient's daughter over the phone Carley Hammed). She then asked if I could discuss with her other sister who just showed up in the hospital, [name]. I went out and talked to [place] & her husband as well.

## 2013-03-25 NOTE — Anesthesia Preprocedure Evaluation (Addendum)
Anesthesia Evaluation  Patient identified by MRN, date of birth, ID band Patient awake    Reviewed: Allergy & Precautions, H&P , NPO status , Patient's Chart, lab work & pertinent test results  Airway Mallampati: II TM Distance: >3 FB Neck ROM: Full    Dental no notable dental hx.    Pulmonary  breath sounds clear to auscultation  Pulmonary exam normal       Cardiovascular hypertension, Pt. on medications + Past MI and +CHF + dysrhythmias Atrial Fibrillation Rhythm:Irregular Rate:Normal  Left ventricle:  There is severe hypokinesis to akinesis of the entire mid and distal and apical myocardial segments consistent with Tak[place]sobu CM The cavity size was normal. There was moderate concentric hypertrophy. Systolic function was moderately to severely reduced. The estimated ejection fraction was in the range of 30% to 35%.  Regional wall motion abnormalities:   There is akinesis of the entireapical myocardium.  There is severe hypokinesis of the mid-distalanteroseptal, anterolateral, and inferior myocardium. There was an increased relative contribution of atrial contraction to ventricular filling. Doppler parameters are consistent with abnormal left ventricular relaxation (grade 1 diastolic dysfunction).    Neuro/Psych TIAnegative psych ROS   GI/Hepatic negative GI ROS, Neg liver ROS,   Endo/Other  Hypothyroidism   Renal/GU negative Renal ROS  negative genitourinary   Musculoskeletal negative musculoskeletal ROS (+)   Abdominal   Peds negative pediatric ROS (+)  Hematology negative hematology ROS (+)   Anesthesia Other Findings   Reproductive/Obstetrics negative OB ROS                         Anesthesia Physical Anesthesia Plan  ASA: IV  Anesthesia Plan: General   Post-op Pain Management:    Induction: Intravenous, Rapid sequence and Cricoid pressure planned  Airway Management Planned:  Oral ETT  Additional Equipment:   Intra-op Plan:   Post-operative Plan: Possible Post-op intubation/ventilation  Informed Consent: I have reviewed the patients History and Physical, chart, labs and discussed the procedure including the risks, benefits and alternatives for the proposed anesthesia with the patient or authorized representative who has indicated his/her understanding and acceptance.   Dental advisory given  Plan Discussed with: CRNA and Surgeon  Anesthesia Plan Comments:       Anesthesia Quick Evaluation

## 2013-03-25 NOTE — Progress Notes (Signed)
Patient ID: [name], female   DOB: 05-17-1925, 79 y.o.   MRN: [id number] Subjective: No chest pain dyspnea or palpitations  Converted to sinus rhythm around midnight last night. Her blood pressure has improved significantly. She was having a lot of vomiting and an NG tube was replaced. No current shortness of breath or chest pain. Echocardiogram showed an EF of 30-35%. Her EF in September was 50-55%. This would suggest either stress-induced or due to rapid atrial fibrillation.  Objective:  Vital Signs in the last 24 hours: BP 144/64  Pulse 68  Temp(Src) 97.4 F (36.3 C) (Oral)  Resp 15  Ht 5\' 5"  (1.651 m)  Wt 171 lb 4.8 oz (77.7 kg)  BMI 28.51 kg/m2  SpO2 100%  Physical Exam: Pleasant white female with NG tube in place to suction Lungs:  Clear Cardiac:  Regular rhythm, normal S1 and S2, no S3 Abdomen: Mildly distended, mildly tender  Extremities:  No edema present  Intake/Output from previous day: 11/02 0701 - 11/03 0700 In: 3580.8 [I.V.:3010.8; NG/GT:20; IV Piggyback:550] Out: 1530 [Urine:1130; Emesis/NG output:400]  Weight Filed Weights   03/23/13 0400 03/24/13 0452 03/25/13 0500  Weight: 162 lb 4.1 oz (73.6 kg) 166 lb 14.2 oz (75.7 kg) 171 lb 4.8 oz (77.7 kg)    Lab Results: Basic Metabolic Panel:  Recent Labs  40/98/11 0452 03/25/13 0515  NA 140 140  K 2.9* 3.1*  CL 111 112  CO2 18* 18*  GLUCOSE 112* 87  BUN 12 9  CREATININE 0.95 0.86   CBC:  Recent Labs  03/22/13 1450 03/23/13 0245 03/24/13 0452  WBC 2.8* 4.6 5.0  NEUTROABS 0.8*  --   --   HGB 10.4* 9.7* 8.8*  HCT 30.5* 28.9* 26.7*  MCV 89.2 90.6 92.7  PLT 257 258 189   Cardiac Enzymes:  Recent Labs  03/23/13 0245 03/23/13 0750 03/23/13 1350  TROPONINI 1.23* 0.84* 0.62*   . antiseptic oral rinse  15 mL Mouth Rinse q12n4p  . atorvastatin  20 mg Oral Q supper  . chlorhexidine  15 mL Mouth Rinse BID  . [START ON 04/06/2013] cyanocobalamin  1,000 mcg Intramuscular Q30 days  .  levothyroxine  62.5 mcg Intravenous Daily  . lip balm  1 application Topical BID  . multivitamin with minerals  1 tablet Oral Daily  . mycophenolate  1,000 mg Oral BID  . pantoprazole (PROTONIX) IV  40 mg Intravenous Q12H  . potassium chloride  10 mEq Intravenous Q1 Hr x 4  . sodium chloride  3 mL Intravenous Q12H   . sodium chloride 125 mL/hr at 03/25/13 0551  . amiodarone (NEXTERONE PREMIX) 360 mg/200 mL dextrose 30 mg/hr (03/25/13 0213)   Telemetry: Sinus rhythm with left bundle branch block  Assessment/Plan: 1. atrial fibrillation that has now converted to sinus rhythm on IV amiodarone In setting of SEMI and PAF with better BP start low dose beta blocker  No anticoagulation for now just DVT prophylaxis 2. cardiomyopathy that could have been due to rapid atrial fibrillation, stress-induced Arsenio Katz) her ejection fraction in September was 50-55%. Add ACE before d/c if possible 3. Non-STEMI likely secondary to demand ischemia and rapid atrial fibrillation No plans for cath at this time given lack of chest pain advacned age and active bowel issues 4. Hypokalemia:  Replace  Consider transfusion to Hb of 10    Charlton Haws 11:09 AM 03/25/2013

## 2013-03-26 ENCOUNTER — Encounter (HOSPITAL_COMMUNITY): Payer: Self-pay | Admitting: Surgery

## 2013-03-26 LAB — CBC
Hemoglobin: 9.3 g/dL — ABNORMAL LOW (ref 12.0–15.0)
MCH: 30.4 pg (ref 26.0–34.0)
MCHC: 32.6 g/dL (ref 30.0–36.0)
MCV: 93.1 fL (ref 78.0–100.0)
Platelets: 209 10*3/uL (ref 150–400)
RBC: 3.06 MIL/uL — ABNORMAL LOW (ref 3.87–5.11)
RDW: 16.7 % — ABNORMAL HIGH (ref 11.5–15.5)

## 2013-03-26 LAB — BASIC METABOLIC PANEL
Calcium: 7.8 mg/dL — ABNORMAL LOW (ref 8.4–10.5)
GFR calc Af Amer: 65 mL/min — ABNORMAL LOW (ref >90)
GFR calc non Af Amer: 56 mL/min — ABNORMAL LOW (ref >90)
Potassium: 3.5 mEq/L (ref 3.5–5.1)
Sodium: 142 mEq/L (ref 135–145)

## 2013-03-26 MED ORDER — FUROSEMIDE 10 MG/ML IJ SOLN: 20.0000 mg | Freq: Once | INTRAMUSCULAR | Status: DC

## 2013-03-26 NOTE — Progress Notes (Signed)
    Subjective:  Feels ok. No chest pain or shortness of breath.  Objective:  Vital Signs in the last 24 hours: Temp:  [97.4 F (36.3 C)-98.4 F (36.9 C)] 97.5 F (36.4 C) (11/04 0800) Pulse Rate:  [63-76] 75 (11/04 0600) Resp:  [0-19] 19 (11/04 0600) BP: (105-132)/(44-65) 109/56 mmHg (11/03 1845) SpO2:  [85 %-100 %] 100 % (11/04 0600) Arterial Line BP: (128-138)/(47-56) 138/56 mmHg (11/03 1845) Weight:  [182 lb 15.7 oz (83 kg)] 182 lb 15.7 oz (83 kg) (11/04 0500)  Intake/Output from previous day: 11/03 0701 - 11/04 0700 In: 5328.5 [I.V.:4828.5; IV Piggyback:500] Out: 685 [Urine:575; Emesis/NG output:10; Blood:100]  Physical Exam: Pt is alert and oriented, elderly woman in NAD HEENT: normal Neck: JVP - normal Lungs: CTA bilaterally CV: RRR without murmur or gallop Abd: in a binder Ext: no C/C/E in legs, distal pulses intact and equal, bilateral hand swelling Skin: warm/dry no rash   Lab Results:  Recent Labs  03/24/13 0452 03/25/13 1455 03/26/13 0900  WBC 5.0  --  17.5*  HGB 8.8* 8.5* 9.3*  PLT 189  --  209    Recent Labs  03/25/13 0515 03/25/13 1455 03/26/13 0900  NA 140 144 142  K 3.1* 3.5 3.5  CL 112  --  112  CO2 18*  --  15*  GLUCOSE 87  --  112*  BUN 9  --  12  CREATININE 0.86  --  0.90    Recent Labs  03/23/13 1350  TROPONINI 0.62*    Tele: Sinus rhythm, personally reviewed  Assessment/Plan:  1. Atrial fibrillation: maintaining sinus rhythm on IV amiodarone. Will continue this while she is NPO then switch to po amiodarone once she is reliably taking oral meds. 2. Cardiomyopathy - suspect Takotsubo event (stress cardiomyopathy based on wall motion pattern reported by echo. Continue carvediolol. BP too low for ACE right now. 3. NSTEMI - likely demand ischemia or related to #2.   Clinically stable. Will continue to follow with you. thx  Tonny Bollman, M.D. 03/26/2013, 11:10 AM

## 2013-03-26 NOTE — Progress Notes (Signed)
I called patient's primary PCP Dr. Gershon Crane to inform him of patient's admission and hospitalization course including NSETMI, A fib with RVR and hernia repair.    Manson Passey [facility] [phone]

## 2013-03-26 NOTE — Progress Notes (Addendum)
[name] [id number] 11-09-1924  CARE TEAM:  PCP: Nelwyn Salisbury, MD  Outpatient Care Team: Patient Care Team: Nelwyn Salisbury, MD as PCP - General  Inpatient Treatment Team: Treatment Team: Attending Provider: Alison Murray, MD; Registered Nurse: Rayburn Ma, RN; Consulting Physician: Bishop Limbo, MD; Registered Nurse: Hewitt Blade, RN; Consulting Physician: Kathleene Hazel, MD; Rounding Team: Jessie Foot, MD; Registered Nurse: Brantley Persons, RN; Technician: Larwance Rote, NT; Registered Nurse: Toney Sang, RN   Subjective:  Sore but OK No major events   Objective:  Vital signs:  Filed Vitals:   03/26/13 0300 03/26/13 0400 03/26/13 0500 03/26/13 0600  BP:      Pulse: 65 66 68 75  Temp:  98.4 F (36.9 C)    TempSrc:  Oral    Resp: 14 15 0 19  Height:      Weight:   182 lb 15.7 oz (83 kg)   SpO2: 100% 100% 100% 100%    Last BM Date: 03/21/13  Intake/Output   Yesterday:  11/03 0701 - 11/04 0700 In: 5328.5 [I.V.:4828.5; IV Piggyback:500] Out: 685 [Urine:575; Emesis/NG output:10; Blood:100] This shift:     Bowel function:  Flatus: n  BM: n  Drain: NGT scant output  Physical Exam:  General: Pt awake/alert/oriented x4 in no acute distress Eyes: PERRL, normal EOM.  Sclera clear.  No icterus Neuro: CN II-XII intact w/o focal sensory/motor deficits. Lymph: No head/neck/groin lymphadenopathy Psych:  No delerium/psychosis/paranoia HENT: Normocephalic, Mucus membranes moist.  No thrush Neck: Supple, No tracheal deviation Chest: No chest wall pain w good excursion CV:  Pulses intact.  Regular rhythm MS: Normal AROM mjr joints.  No obvious deformity Abdomen: Soft.  Nondistended.  Mildly tender at incisions only.  No evidence of peritonitis.  No incarcerated hernias.  OnQ in place Ext:  SCDs BLE.  No mjr edema.  No cyanosis Skin: No petechiae / purpura   Problem List:   Principal Problem:   NSTEMI (non-ST  elevated myocardial infarction) Active Problems:   HYPOTHYROIDISM   ANXIETY   Acute renal failure   Atrial fibrillation with RVR   N&V (nausea and vomiting)   Porcelain gallbladder   Incarcerated incisional hernia   Moderate protein-calorie malnutrition   Anemia of chronic disease   Acute respiratory failure with hypoxia   Protein-calorie malnutrition, severe   SBO (small bowel obstruction)   Assessment  [name]  79 y.o. female  1 Day Post-Op  Procedure(s): LAPAROSCOPIC VENTRAL HERNIA REPAIR  LAPAROSCOPIC CHOLECYSTECTOMY WITH INTRAOPERATIVE CHOLANGIOGRAM INSERTION OF MESH LAPAROSCOPIC LYSIS OF ADHESIONS PARTIAL OMENTECTOMY  Stable so far s/p lap chole/VWH repair  Plan:  -NGT decompression -IVF -pain control -await for return of bowel function -IV amiodarone for Afib/cardiac strain vs preop MI -anaemia - transfuse if Hgb <7 or if evid of more cardiac strain - defer to card/med MDs -VTE prophylaxis- SCDs, etc.  OK to anticoagulate as needed -mobilize as tolerated to help recovery  Ardeth Sportsman, M.D., F.A.C.S. Gastrointestinal and Minimally Invasive Surgery [facility] [address], [address] [place], [place] [address] [phone] Main / Paging   03/26/2013   Results:   Labs: Results for orders placed during the hospital encounter of 03/22/13 (from the past 48 hour(s))  PROCALCITONIN     Status: None   Collection Time    03/25/13  5:15 AM      Result Value Range   Procalcitonin <0.10     Comment:  Interpretation:     PCT (Procalcitonin) <= 0.5 ng/mL:     Systemic infection (sepsis) is not likely.     Local bacterial infection is possible.     (NOTE)             ICU PCT Algorithm               Non ICU PCT Algorithm        ----------------------------     ------------------------------             PCT < 0.25 ng/mL                 PCT < 0.1 ng/mL         Stopping of antibiotics            Stopping of  antibiotics           strongly encouraged.               strongly encouraged.        ----------------------------     ------------------------------           PCT level decrease by               PCT < 0.25 ng/mL           >= 80% from peak PCT           OR PCT 0.25 - 0.5 ng/mL          Stopping of antibiotics                                                 encouraged.         Stopping of antibiotics               encouraged.        ----------------------------     ------------------------------           PCT level decrease by              PCT >= 0.25 ng/mL           < 80% from peak PCT            AND PCT >= 0.5 ng/mL            Continuing antibiotics                                                  encouraged.           Continuing antibiotics                encouraged.        ----------------------------     ------------------------------         PCT level increase compared          PCT > 0.5 ng/mL             with peak PCT AND              PCT >= 0.5 ng/mL             Escalation of antibiotics  strongly encouraged.          Escalation of antibiotics            strongly encouraged.  BASIC METABOLIC PANEL     Status: Abnormal   Collection Time    03/25/13  5:15 AM      Result Value Range   Sodium 140  135 - 145 mEq/L   Potassium 3.1 (*) 3.5 - 5.1 mEq/L   Chloride 112  96 - 112 mEq/L   CO2 18 (*) 19 - 32 mEq/L   Glucose, Bld 87  70 - 99 mg/dL   BUN 9  6 - 23 mg/dL   Creatinine, Ser 1.19  0.50 - 1.10 mg/dL   Calcium 8.4  8.4 - 14.7 mg/dL   GFR calc non Af Amer 59 (*) >90 mL/min   GFR calc Af Amer 69 (*) >90 mL/min   Comment: (NOTE)     The eGFR has been calculated using the CKD EPI equation.     This calculation has not been validated in all clinical situations.     eGFR's persistently <90 mL/min signify possible Chronic Kidney     Disease.  GLUCOSE, CAPILLARY     Status: None   Collection Time    03/25/13 12:20 PM      Result Value  Range   Glucose-Capillary 72  70 - 99 mg/dL   Comment 1 Documented in Chart     Comment 2 Notify RN    TYPE AND SCREEN     Status: None   Collection Time    03/25/13  1:10 PM      Result Value Range   ABO/RH(D) B POS     Antibody Screen NEG     Sample Expiration 03/28/2013     Unit Number [id number]     Blood Component Type RED CELLS,LR     Unit division 00     Status of Unit ALLOCATED     Transfusion Status OK TO TRANSFUSE     Crossmatch Result Compatible     Unit Number [id number]     Blood Component Type RED CELLS,LR     Unit division 00     Status of Unit ALLOCATED     Transfusion Status OK TO TRANSFUSE     Crossmatch Result Compatible    POCT I-STAT 7, (LYTES, BLD GAS, ICA,H+H)     Status: Abnormal   Collection Time    03/25/13  2:55 PM      Result Value Range   pH, Arterial 7.254 (*) 7.350 - 7.450   pCO2 arterial 33.5 (*) 35.0 - 45.0 mmHg   pO2, Arterial 201.0 (*) 80.0 - 100.0 mmHg   Bicarbonate 15.0 (*) 20.0 - 24.0 mEq/L   TCO2 16  0 - 100 mmol/L   O2 Saturation 100.0     Acid-base deficit 11.0 (*) 0.0 - 2.0 mmol/L   Sodium 144  135 - 145 mEq/L   Potassium 3.5  3.5 - 5.1 mEq/L   Calcium, Ion 1.22  1.13 - 1.30 mmol/L   HCT 25.0 (*) 36.0 - 46.0 %   Hemoglobin 8.5 (*) 12.0 - 15.0 g/dL   Patient temperature 24.4 C     Sample type ARTERIAL    BLOOD GAS, ARTERIAL     Status: Abnormal   Collection Time    03/25/13  6:23 PM      Result Value Range   O2 Content 10.0     Delivery systems OXYGEN MASK  pH, Arterial 7.270 (*) 7.350 - 7.450   pCO2 arterial 28.2 (*) 35.0 - 45.0 mmHg   pO2, Arterial 271.0 (*) 80.0 - 100.0 mmHg   Bicarbonate 12.5 (*) 20.0 - 24.0 mEq/L   TCO2 12.0  0 - 100 mmol/L   Acid-base deficit 12.9 (*) 0.0 - 2.0 mmol/L   O2 Saturation 99.4     Patient temperature 98.6     Collection site A-LINE     Drawn by [id number]     Sample type ARTERIAL DRAW      Imaging / Studies: Dg Cholangiogram Operative  03/25/2013   CLINICAL DATA:   Cholecystectomy for porcelain gallbladder.  EXAM: INTRAOPERATIVE CHOLANGIOGRAM  TECHNIQUE: Cholangiographic images from the C-arm fluoroscopic device were submitted for interpretation post-operatively. Please see the procedural report for the amount of contrast and the fluoroscopy time utilized.  COMPARISON:  None.  FINDINGS: Intraoperative imaging with a C-arm demonstrates some initial extravasation of injected contrast at the level of the cystic duct. The biliary tree shows no evidence of filling defect or obstruction.  IMPRESSION: No filling defects identified by intraoperative cholangiography.   Electronically Signed   By: Irish Lack M.D.   On: 03/25/2013 16:53   Dg Chest Port 1 View  03/25/2013   CLINICAL DATA:  Small bowel obstruction  EXAM: PORTABLE CHEST - 1 VIEW  COMPARISON:  03/22/2013  FINDINGS: Stable right IJ Port-A-Cath position. NG tube in place. Borderline cardiomegaly. Mild left basilar atelectasis. No acute infiltrate or pulmonary edema.  IMPRESSION: NG tube in place. No active disease. Borderline cardiomegaly. Mild left basilar atelectasis.   Electronically Signed   By: Natasha Mead M.D.   On: 03/25/2013 08:59   Dg Abd Portable 2v  03/25/2013   CLINICAL DATA:  Followup small bowel obstruction  EXAM: PORTABLE ABDOMEN - 2 VIEW  COMPARISON:  02/10/2013  FINDINGS: Nasogastric tube tip is in the stomach. There is a IVC filter in place. Porcelain gallbladder is identified within the right upper quadrant of the abdomen. The bowel gas pattern is normal. There is no evidence of free air.  IMPRESSION: 1.  Nonobstructive bowel gas pattern.  2. Porcelain gallbladder  3. IVC filter.   Electronically Signed   By: Signa Kell M.D.   On: 03/25/2013 09:03    Medications / Allergies: per chart  Antibiotics: Anti-infectives   Start     Dose/Rate Route Frequency Ordered Stop   03/26/13 0600  ceFAZolin (ANCEF) IVPB 2 g/50 mL premix     2 g 100 mL/hr over 30 Minutes Intravenous On call to O.R.  03/25/13 1259 03/25/13 1356   03/26/13 0600  metroNIDAZOLE (FLAGYL) IVPB 500 mg     500 mg 100 mL/hr over 60 Minutes Intravenous On call to O.R. 03/25/13 1259 03/25/13 1412   03/25/13 2200  ceFAZolin (ANCEF) IVPB 2 g/50 mL premix     2 g 100 mL/hr over 30 Minutes Intravenous 3 times per day 03/25/13 1859 03/28/13 2159   03/25/13 2200  metroNIDAZOLE (FLAGYL) IVPB 500 mg     500 mg 100 mL/hr over 60 Minutes Intravenous 3 times per day 03/25/13 1859 03/28/13 2159   03/25/13 1515  clindamycin (CLEOCIN) 900 mg, gentamicin (GARAMYCIN) 240 mg in sodium chloride 0.9 % 1,000 mL for intraperitoneal lavage      Intraperitoneal To Surgery 03/25/13 1507 03/25/13 1720

## 2013-03-26 NOTE — Progress Notes (Signed)
[id number] Earlene Plater, RN, BSN, [place] [phone] Chart Reviewed for discharge and hospital needs. Discharge needs at time of review:  None Review of patient progress due on [id number].

## 2013-03-26 NOTE — Evaluation (Signed)
Occupational Therapy Evaluation Patient Details Name: [name] MRN: [id number] DOB: 28-Dec-1924 Today's Date: 03/26/2013 Time: 7829-5621 OT Time Calculation (min): 26 min  OT Assessment / Plan / Recommendation History of present illness Pt was admitted with nausea/vomiting.  She is s/p ventral hernia repair, cholecystectomy, mesh, lysis of lesions and partial omenectomy   Clinical Impression   Pt was admitted with nausea/vomiting and underwent the above sx.  She will benefit from skilled OT to increase safety and independence with adls with min A level goals overall.  Pt was mostly mod I with adls prior to admission.       OT Assessment  Patient needs continued OT Services    Follow Up Recommendations  Home health OT;Supervision/Assistance - 24 hour;SNF (depending upon progress)    Barriers to Discharge      Equipment Recommendations  None recommended by OT    Recommendations for Other Services    Frequency  Min 2X/week    Precautions / Restrictions Precautions Precautions: Fall Restrictions Weight Bearing Restrictions: No   Pertinent Vitals/Pain Pt sore at incision sites; no other c/o pain    ADL  Grooming: Set up Where Assessed - Grooming: Supported sitting Upper Body Bathing: Minimal assistance (manage lines) Where Assessed - Upper Body Bathing: Supported sitting Lower Body Bathing: +2 Total assistance Lower Body Bathing: Patient Percentage: 20% Where Assessed - Lower Body Bathing: Supported sit to stand Upper Body Dressing: Moderate assistance (multiple lines) Where Assessed - Upper Body Dressing: Supported sit to stand Lower Body Dressing: +2 Total assistance Lower Body Dressing: Patient Percentage: 0% Where Assessed - Lower Body Dressing: Supported sit to stand Toilet Transfer: Simulated;+2 Total assistance Toilet Transfer: Patient Percentage: 50% Statistician Method: Stand pivot--hand held Pharmacologist:  (chair to bed) Toileting  - Architect and Hygiene: +2 Total assistance Toileting - Clothing Manipulation and Hygiene: Patient Percentage: 10% Where Assessed - Toileting Clothing Manipulation and Hygiene: Sit to stand from 3-in-1 or toilet Transfers/Ambulation Related to ADLs: performed spt back to bed.  Upon first stand, pt's knees were buckling.  Repositioned bed right up to chair for spt. RN assisted OT with transfer back to bed.  During transfer, IV site  bled.  ADL Comments: pt has a Sports administrator at home; educated on uses for adls    OT Diagnosis: Generalized weakness  OT Problem List: Decreased strength;Decreased activity tolerance;Impaired balance (sitting and/or standing);Decreased knowledge of use of DME or AE;Pain OT Treatment Interventions: Self-care/ADL training;DME and/or AE instruction;Patient/family education;Therapeutic exercise;Energy conservation;Balance training   OT Goals(Current goals can be found in the care plan section) Acute Rehab OT Goals Patient Stated Goal: none stated OT Goal Formulation: With patient Time For Goal Achievement: 04/09/13 Potential to Achieve Goals: Good ADL Goals Pt Will Perform Grooming: with min guard assist;standing Pt Will Perform Lower Body Bathing: with min assist;with adaptive equipment;sit to/from stand Pt Will Perform Lower Body Dressing: with mod assist;sit to/from stand;with adaptive equipment Pt Will Transfer to Toilet: with min assist;stand pivot transfer;bedside commode Pt Will Perform Toileting - Clothing Manipulation and hygiene: with min assist;sit to/from stand Additional ADL Goal #1: Pt will perform 2 sets of 10 A/AAROM to bil shoulders to increase strength for adls  Visit Information  Last OT Received On: 03/26/13 History of Present Illness: Pt was admitted with nausea/vomiting.  She is s/p ventral hernia repair, cholecystectomy, mesh, lysis of lesions and partial omenectomy       Prior Functioning     Home Living Family/patient expects  to  be discharged to:: Private residence Living Arrangements: Children (son in law there around the clock) Available Help at Discharge: Family;Available 24 hours/day Type of Home: House Home Access: Level entry Home Layout: Two level;Able to live on main level with bedroom/bathroom Home Equipment: Walker - 4 wheels;Bedside commode Additional Comments: has built in seat in shower, grab bars around toilet, uses 4 wheeled walker Prior Function Level of Independence: Independent with assistive device(s) Comments: daughter washed hair Communication Communication: No difficulties         Vision/Perception     Cognition       Extremity/Trunk Assessment Upper Extremity Assessment Upper Extremity Assessment: Generalized weakness (RUE 3-/5; L at least 3/5 shoulder)     Mobility Bed Mobility Bed Mobility: Sit to Supine Sit to Supine: 1: +2 Total assist Sit to Supine: Patient Percentage: 20% Details for Bed Mobility Assistance: assist for trunk and bil LEs Transfers Transfers: Sit to Stand;Stand to Sit Sit to Stand: 1: +2 Total assist;With upper extremity assist;From chair/3-in-1 Sit to Stand: Patient Percentage: 50% Details for Transfer Assistance: assist to rise and steady     Exercise Other Exercises Other Exercises: pt educated to make fist and open for bil hand edema Other Exercises: performed 1 set of 10 shoulder flexion exercises--R AAROM and L AROM   Balance     End of Session OT - End of Session Activity Tolerance: Patient limited by fatigue Patient left: in bed;with call bell/phone within reach;with nursing/sitter in room  GO     Tarsha Blando 03/26/2013, 3:45 PM Marica Otter, OTR/L [phone] 03/26/2013 Marica Otter, OTR/L [phone] 03/26/2013

## 2013-03-26 NOTE — Progress Notes (Signed)
[facility] PROGRESS NOTE  [name] AVW:[id number] DOB: 14-Sep-1924 DOA: 03/22/2013 PCP: Nelwyn Salisbury, MD  Brief narrative: 79 year old female with past medical history of ulcerative colitis, CMV colitis, hypertension, hypothyroidism, anxiety who presented to [facility] ED 03/22/2013 with complaints of intractable nausea and vomiting associated with poor oral intake over past 1 week prior to this admission. Patient was subsequently found to have incarcerated hernia adn loop of small bowel within the hernia based on CT abdomen. Please note that the reason why patient required SDU admission is because initial vital signs in ED were BP 70/35, HR 39-144 and T max 98.3 F. Oxygen saturation was 84% on room air. BP has improved with initial IV fluids to 143/80 (after 1.5 L NS) but then bounced back to 88/51.Patient however did not require pressor support.  Hospital course is complicated with additional problem of A fib with RVR and NSTEMI. Patient has converted to NSR with digoxin and amiodarone but now requires amiodarone only per cardiology. In addition, pt underwent laparoscopic ventral hernia repair 03/25/2013.  Assessment/Plan:   Principal Problem:  N&V (nausea and vomiting) in the setting of incarcerated hernia and small bowel within the hernia  - likely related to loop of small bowel within the incarcerated hernia seen on CT abdomen  - status post laparoscopic ventral hernia repair 03/25/2013 and no subsequent complications - appreciate surgery following and their recommendations - continue IV fluids (NS@ 125 cc/hr - reassess in 24 hours if she still needs IV fluids and watch for fluid overload), transfuse for hemoglobin of less than 7 and continue flagyl and cefazolin per surgery  Active Problems:  Atrial fibrillation with RVR  - appreciate cardiology following  - pt was started on amiodarone and digoxin per cardio recommendations. Pt converted to NSR and now only on amiodarone for past 48  hours - heart rate regular, 63 - 76 - pt also started on low dose beta blocker coreg 3.125 mg PO BID - no anticoagulation at this time, only DVT prophylaxis per cardio NSTEMI (non-ST elevated myocardial infarction)  - cardiac enzymes trended down, 1.23 --> 0.62  - 2 D ECHO on this admission - EF 30% to 35%; akinesisof the entire apical myocardium; severe hypokinesis of the mid-distalanteroseptal, anterolateral and inferior myocardium; grade 1 diastolic dysfunction  - cardio is following  - now on coreg 3.125 mg PO BID, amiodarone Acute renal failure  - likely due to GI losses, dehydration, pre-renal etiology  - renal function WNL  - follow up BMP this am Hypokalemia  - repleted with IV potassium daily (on 11/2 6 IV K runs and on 11/3 with 3 IV K runs)  - follow up BMP today Anemia of chronic disease  - secondary to history of thyroid disease  - hemoglobin 10.4 --> 9.7 --> 8.8  - check CBC today, transfuse if hemoglobin less than 7 Acute hypoxic respiratory failure  - unclear etiology  - CXR did not reveal acute cardiopulmonary porcess  - resp status stable; O2 saturation 100% on 2 L nasal canula  HYPOTHYROIDISM  - continue synthroid IV  ANXIETY  - ativan ordered IV PRN at bedtime  Incarcerated incisional hernia  - surgery is following  Moderate protein-calorie malnutrition  - may improve once pt resumes oral intake   Code Status: full code  Family Communication: no family at the bedside  Disposition Plan: remains inpatient, in ICU (SDU)  Manson Passey, MD  [facility]  Pager [phone]   Consultants:  Cardiology  Surgery  Procedures:  NG tube placement 03/23/2013 Antibiotics:  Flagyl 03/25/2013 --> Cefazolin 03/25/2013 -->   If 7PM-7AM, please contact night-coverage [email or web address] Password [email or web address] 03/26/2013, 8:02 AM   LOS: 4 days    HPI/Subjective: No acute overnight events.  Objective: Filed Vitals:   03/26/13 0300 03/26/13 0400 03/26/13 0500 03/26/13 0600   BP:      Pulse: 65 66 68 75  Temp:  98.4 F (36.9 C)    TempSrc:  Oral    Resp: 14 15 0 19  Height:      Weight:   83 kg (182 lb 15.7 oz)   SpO2: 100% 100% 100% 100%    Intake/Output Summary (Last 24 hours) at 03/26/13 0802 Last data filed at 03/26/13 0620  Gross per 24 hour  Intake 5186.8 ml  Output    610 ml  Net 4576.8 ml    Exam:   General:  Pt is sleeping, not in acute distress  Cardiovascular: Regular rate and rhythm, S1/S2 appreciated  Respiratory: Clear to auscultation bilaterally, no wheezing, no crackles, no rhonchi  Abdomen: Soft, tender at the incision site, non distended  Extremities: UE edema b/l noted, trace; pulses DP and PT palpable bilaterally  Neuro: Grossly nonfocal  Data Reviewed: Basic Metabolic Panel:  Recent Labs Lab 03/22/13 1450 03/23/13 0245 03/24/13 0452 03/25/13 0515 03/25/13 1455  NA 136 137 140 140 144  K 3.8 3.7 2.9* 3.1* 3.5  CL 100 104 111 112  --   CO2 16* 16* 18* 18*  --   GLUCOSE 157* 127* 112* 87  --   BUN 24* 21 12 9   --   CREATININE 1.25* 1.15* 0.95 0.86  --   CALCIUM 9.5 8.4 8.4 8.4  --   MG  --   --  1.3*  --   --   PHOS  --   --  3.0  --   --    Liver Function Tests:  Recent Labs Lab 03/22/13 1450 03/24/13 0452  AST 19 11  ALT 9 7  ALKPHOS 65 51  BILITOT 0.8 0.4  PROT 5.8* 4.5*  ALBUMIN 2.8* 2.2*    Recent Labs Lab 03/22/13 1450  LIPASE 15   No results found for this basename: AMMONIA,  in the last 168 hours CBC:  Recent Labs Lab 03/22/13 1450 03/23/13 0245 03/24/13 0452 03/25/13 1455  WBC 2.8* 4.6 5.0  --   NEUTROABS 0.8*  --   --   --   HGB 10.4* 9.7* 8.8* 8.5*  HCT 30.5* 28.9* 26.7* 25.0*  MCV 89.2 90.6 92.7  --   PLT 257 258 189  --    Cardiac Enzymes:  Recent Labs Lab 03/22/13 1450 03/22/13 1909 03/23/13 0245 03/23/13 0750 03/23/13 1350  TROPONINI 0.87* 1.87* 1.23* 0.84* 0.62*   BNP: No components found with this basename: POCBNP,  CBG:  Recent Labs Lab  03/25/13 1220  GLUCAP 72    MRSA PCR SCREENING     Status: None   Collection Time    03/22/13  8:51 PM      Result Value Range Status   MRSA by PCR NEGATIVE  NEGATIVE Final     Studies: Dg Cholangiogram Operative 03/25/2013     IMPRESSION: No filling defects identified by intraoperative cholangiography.   Electronically Signed   By: Irish Lack M.D.   On: 03/25/2013 16:53   Dg Chest Port 1 View 03/25/2013    IMPRESSION: NG tube in place. No active disease. Borderline cardiomegaly. Mild left  basilar atelectasis.   Electronically Signed   By: Natasha Mead M.D.   On: 03/25/2013 08:59   Dg Abd Portable 2v 03/25/2013   .  IMPRESSION: 1.  Nonobstructive bowel gas pattern.  2. Porcelain gallbladder  3. IVC filter.   Electronically Signed   By: Signa Kell M.D.   On: 03/25/2013 09:03    Scheduled Meds: . carvedilol  3.125 mg Oral BID WC  . ceFAZolin  2 g Intravenous Q8H  . levothyroxine  62.5 mcg Intravenous Daily  . metroNIDAZOLE  500 mg Intravenous Q8H  . multivitamin   1 tablet Oral Daily  . mycophenolate  1,000 mg Oral BID  . pantoprazole   40 mg Intravenous Q12H   Continuous Infusions: . sodium chloride 125 mL (03/26/13 0452)  . amiodarone  30 mg/hr (03/26/13 0230)

## 2013-03-27 ENCOUNTER — Inpatient Hospital Stay (HOSPITAL_COMMUNITY): Payer: Medicare Other

## 2013-03-27 ENCOUNTER — Ambulatory Visit: Payer: Medicare Other | Admitting: Family Medicine

## 2013-03-27 DIAGNOSIS — E876 Hypokalemia: Secondary | ICD-10-CM | POA: Diagnosis present

## 2013-03-27 DIAGNOSIS — I2789 Other specified pulmonary heart diseases: Secondary | ICD-10-CM

## 2013-03-27 DIAGNOSIS — K430 Incisional hernia with obstruction, without gangrene: Principal | ICD-10-CM

## 2013-03-27 DIAGNOSIS — I5022 Chronic systolic (congestive) heart failure: Secondary | ICD-10-CM | POA: Diagnosis present

## 2013-03-27 DIAGNOSIS — I272 Pulmonary hypertension, unspecified: Secondary | ICD-10-CM | POA: Diagnosis present

## 2013-03-27 LAB — COMPREHENSIVE METABOLIC PANEL
ALT: 6 U/L (ref 0–35)
AST: 17 U/L (ref 0–37)
Albumin: 1.5 g/dL — ABNORMAL LOW (ref 3.5–5.2)
Calcium: 7.7 mg/dL — ABNORMAL LOW (ref 8.4–10.5)
Chloride: 118 mEq/L — ABNORMAL HIGH (ref 96–112)
Creatinine, Ser: 0.84 mg/dL (ref 0.50–1.10)
Sodium: 144 mEq/L (ref 135–145)
Total Bilirubin: 0.2 mg/dL — ABNORMAL LOW (ref 0.3–1.2)

## 2013-03-27 LAB — CBC WITH DIFFERENTIAL/PLATELET
Basophils Absolute: 0 10*3/uL (ref 0.0–0.1)
Eosinophils Absolute: 0 10*3/uL (ref 0.0–0.7)
Hemoglobin: 8.8 g/dL — ABNORMAL LOW (ref 12.0–15.0)
Lymphocytes Relative: 5 % — ABNORMAL LOW (ref 12–46)
Lymphs Abs: 0.8 10*3/uL (ref 0.7–4.0)
MCH: 30.4 pg (ref 26.0–34.0)
MCHC: 33.2 g/dL (ref 30.0–36.0)
Monocytes Absolute: 1.1 10*3/uL — ABNORMAL HIGH (ref 0.1–1.0)
Monocytes Relative: 7 % (ref 3–12)
Neutrophils Relative %: 88 % — ABNORMAL HIGH (ref 43–77)
Platelets: 196 10*3/uL (ref 150–400)
RDW: 16.9 % — ABNORMAL HIGH (ref 11.5–15.5)
WBC: 16.1 10*3/uL — ABNORMAL HIGH (ref 4.0–10.5)

## 2013-03-27 LAB — BASIC METABOLIC PANEL
BUN: 14 mg/dL (ref 6–23)
CO2: 15 mEq/L — ABNORMAL LOW (ref 19–32)
Chloride: 116 mEq/L — ABNORMAL HIGH (ref 96–112)
Creatinine, Ser: 0.95 mg/dL (ref 0.50–1.10)
GFR calc Af Amer: 61 mL/min — ABNORMAL LOW (ref >90)
GFR calc non Af Amer: 53 mL/min — ABNORMAL LOW (ref >90)
Glucose, Bld: 115 mg/dL — ABNORMAL HIGH (ref 70–99)
Potassium: 2.9 mEq/L — ABNORMAL LOW (ref 3.5–5.1)
Sodium: 143 mEq/L (ref 135–145)

## 2013-03-27 LAB — CBC
HCT: 24.4 % — ABNORMAL LOW (ref 36.0–46.0)
Hemoglobin: 8.1 g/dL — ABNORMAL LOW (ref 12.0–15.0)
MCHC: 33.2 g/dL (ref 30.0–36.0)
Platelets: 176 10*3/uL (ref 150–400)
RDW: 16.7 % — ABNORMAL HIGH (ref 11.5–15.5)
WBC: 12.9 10*3/uL — ABNORMAL HIGH (ref 4.0–10.5)

## 2013-03-27 LAB — PRO B NATRIURETIC PEPTIDE: Pro B Natriuretic peptide (BNP): 5592 pg/mL — ABNORMAL HIGH (ref 0–450)

## 2013-03-27 LAB — PROCALCITONIN: Procalcitonin: 0.41 ng/mL

## 2013-03-27 LAB — POTASSIUM: Potassium: 2.4 mEq/L — CL (ref 3.5–5.1)

## 2013-03-27 MED ORDER — POTASSIUM CHLORIDE 10 MEQ/100ML IV SOLN
10.0000 meq | INTRAVENOUS | Status: AC
  Administered 2013-03-27 (×4): 10 meq via INTRAVENOUS
  Filled 2013-03-27 (×4): qty 100

## 2013-03-27 MED ORDER — POTASSIUM CHLORIDE 10 MEQ/100ML IV SOLN
10.0000 meq | INTRAVENOUS | Status: AC
  Administered 2013-03-27 – 2013-03-28 (×5): 10 meq via INTRAVENOUS
  Filled 2013-03-27 (×5): qty 100

## 2013-03-27 MED ORDER — AMIODARONE HCL 200 MG PO TABS
400.0000 mg | ORAL_TABLET | Freq: Two times a day (BID) | ORAL | Status: DC
  Administered 2013-03-27 – 2013-03-31 (×10): 400 mg via ORAL
  Filled 2013-03-27 (×12): qty 2

## 2013-03-27 MED ORDER — LOPERAMIDE HCL 2 MG PO CAPS
2.0000 mg | ORAL_CAPSULE | Freq: Three times a day (TID) | ORAL | Status: DC | PRN
  Administered 2013-03-27: 2 mg via ORAL
  Filled 2013-03-27: qty 1

## 2013-03-27 NOTE — Progress Notes (Signed)
CRITICAL VALUE ALERT  Critical value received:  Potassium 2.4  Date of notification:  03/27/13  Time of notification:  18:15  Critical value read back:yes  Nurse who received alert:  Gerlean Ren, RN  MD notified (1st page): Woods  Time of first page:  18:17  MD notified (2nd page):  Time of second page:  Responding MD:  Joseph Art  Time MD responded:  18:23

## 2013-03-27 NOTE — Progress Notes (Signed)
2 Days Post-Op  Subjective: Pts pain better controlled.  No N/V.  NG put out .  Having some flatus and +BS, no BM yet.  Ambulated to the chair yesterday.  PT/OT pending.  In SDU due to IV amiodarone and cardiac concerns.    Objective: Vital signs in last 24 hours: Temp:  [97.5 F (36.4 C)-98.6 F (37 C)] 98.6 F (37 C) (11/05 0420) Pulse Rate:  [68-79] 68 (11/05 0700) Resp:  [0-20] 0 (11/05 0700) BP: (94-122)/(33-54) 122/54 mmHg (11/05 0200) SpO2:  [98 %-100 %] 98 % (11/05 0700) Last BM Date: 03/21/13  Intake/Output from previous day: 11/04 0701 - 11/05 0700 In: 3759.1 [I.V.:3279.1; NG/GT:30; IV Piggyback:450] Out: 631 [Urine:531; Emesis/NG output:100] Intake/Output this shift:    PE: Gen:  Alert, NAD, pleasant Abd: Soft, mild tenderness, ND, +BS, no HSM, incisions C/D/I, OnQ pump in place   Lab Results:   Recent Labs  03/26/13 0900 03/27/13 0120  WBC 17.5* 12.9*  HGB 9.3* 8.1*  HCT 28.5* 24.4*  PLT 209 176   BMET  Recent Labs  03/25/13 0515 03/25/13 1455 03/26/13 0900  NA 140 144 142  K 3.1* 3.5 3.5  CL 112  --  112  CO2 18*  --  15*  GLUCOSE 87  --  112*  BUN 9  --  12  CREATININE 0.86  --  0.90  CALCIUM 8.4  --  7.8*   PT/INR No results found for this basename: LABPROT, INR,  in the last 72 hours CMP     Component Value Date/Time   NA 142 03/26/2013 0900   K 3.5 03/26/2013 0900   CL 112 03/26/2013 0900   CO2 15* 03/26/2013 0900   GLUCOSE 112* 03/26/2013 0900   BUN 12 03/26/2013 0900   CREATININE 0.90 03/26/2013 0900   CALCIUM 7.8* 03/26/2013 0900   PROT 4.5* 03/24/2013 0452   ALBUMIN 2.2* 03/24/2013 0452   AST 11 03/24/2013 0452   ALT 7 03/24/2013 0452   ALKPHOS 51 03/24/2013 0452   BILITOT 0.4 03/24/2013 0452   GFRNONAA 56* 03/26/2013 0900   GFRAA 65* 03/26/2013 0900   Lipase     Component Value Date/Time   LIPASE 15 03/22/2013 1450       Studies/Results: Dg Cholangiogram Operative  03/25/2013   CLINICAL DATA:  Cholecystectomy for  porcelain gallbladder.  EXAM: INTRAOPERATIVE CHOLANGIOGRAM  TECHNIQUE: Cholangiographic images from the C-arm fluoroscopic device were submitted for interpretation post-operatively. Please see the procedural report for the amount of contrast and the fluoroscopy time utilized.  COMPARISON:  None.  FINDINGS: Intraoperative imaging with a C-arm demonstrates some initial extravasation of injected contrast at the level of the cystic duct. The biliary tree shows no evidence of filling defect or obstruction.  IMPRESSION: No filling defects identified by intraoperative cholangiography.   Electronically Signed   By: Irish Lack M.D.   On: 03/25/2013 16:53   Dg Chest Port 1 View  03/25/2013   CLINICAL DATA:  Small bowel obstruction  EXAM: PORTABLE CHEST - 1 VIEW  COMPARISON:  03/22/2013  FINDINGS: Stable right IJ Port-A-Cath position. NG tube in place. Borderline cardiomegaly. Mild left basilar atelectasis. No acute infiltrate or pulmonary edema.  IMPRESSION: NG tube in place. No active disease. Borderline cardiomegaly. Mild left basilar atelectasis.   Electronically Signed   By: Natasha Mead M.D.   On: 03/25/2013 08:59   Dg Abd Portable 2v  03/25/2013   CLINICAL DATA:  Followup small bowel obstruction  EXAM: PORTABLE ABDOMEN -  2 VIEW  COMPARISON:  02/10/2013  FINDINGS: Nasogastric tube tip is in the stomach. There is a IVC filter in place. Porcelain gallbladder is identified within the right upper quadrant of the abdomen. The bowel gas pattern is normal. There is no evidence of free air.  IMPRESSION: 1.  Nonobstructive bowel gas pattern.  2. Porcelain gallbladder  3. IVC filter.   Electronically Signed   By: Signa Kell M.D.   On: 03/25/2013 09:03    Anti-infectives: Anti-infectives   Start     Dose/Rate Route Frequency Ordered Stop   03/26/13 0600  ceFAZolin (ANCEF) IVPB 2 g/50 mL premix     2 g 100 mL/hr over 30 Minutes Intravenous On call to O.R. 03/25/13 1259 03/25/13 1356   03/26/13 0600   metroNIDAZOLE (FLAGYL) IVPB 500 mg     500 mg 100 mL/hr over 60 Minutes Intravenous On call to O.R. 03/25/13 1259 03/25/13 1412   03/25/13 2200  ceFAZolin (ANCEF) IVPB 2 g/50 mL premix     2 g 100 mL/hr over 30 Minutes Intravenous 3 times per day 03/25/13 1859 03/28/13 2159   03/25/13 2200  metroNIDAZOLE (FLAGYL) IVPB 500 mg     500 mg 100 mL/hr over 60 Minutes Intravenous 3 times per day 03/25/13 1859 03/28/13 2159   03/25/13 1515  clindamycin (CLEOCIN) 900 mg, gentamicin (GARAMYCIN) 240 mg in sodium chloride 0.9 % 1,000 mL for intraperitoneal lavage      Intraperitoneal To Surgery 03/25/13 1507 03/25/13 1720       Assessment/Plan POD #2 s/p laparoscopic ventral hernia repair, laparoscopic chole with IOC, insertion of mesh, LOA, partial omentectomy 1.  Try clamping trials given low output and return of some bowel function, ok to have sips of clears during clamping trial 2.  IVF, pain control, antiemetics 3.  Appreciate cardiology's recommendations 4.  VTE proph - SCD's, okay to anticoagulate as needed at this point 5.  IS and ambulate OOB - PT/OT to work with pt  AFIB, cardiomyopathy, and NSTEMI being managed by cardiology - appreciate their help, switch to PO amiodarone when reliably taking oral meds    LOS: 5 days    DORT, Calista Crain 03/27/2013, 7:42 AM Pager: [phone]

## 2013-03-27 NOTE — Progress Notes (Addendum)
[facility] PROGRESS NOTE  [name] ZOX:[id number] DOB: 10-23-1924 DOA: 03/22/2013 PCP: Nelwyn Salisbury, MD  Assessment/Plan: Incarcerated hernia and small bowel within the hernia/ N&V (nausea and vomiting)  - likely related to loop of small bowel within the incarcerated hernia seen on CT abdomen  - status post laparoscopic ventral hernia repair 03/25/2013 and no subsequent complications  -  continue IV fluids NS@ 125 cc/hr. per surgery patient will will try sips of clears today - transfuse for hemoglobin of less than 7 -continue flagyl and cefazolin per surgery    Atrial fibrillation with RVR  - Continue Coreg 3.125 mg, and IV Amiodarone. Currently in NSR  - no anticoagulation at this time, only SCDs for DVT prophylaxis per cardio   NSTEMI (non-ST elevated myocardial infarction)  - cardiac enzymes trended down, 1.23 --> 0.62; currently patient asymptomatic (negative CP/SOB)  - 2 D ECHO on this admission - EF 30% to 35%; akinesisof the entire apical myocardium; severe hypokinesis of the mid-distalanteroseptal, anterolateral and inferior myocardium; grade 1 diastolic dysfunction  - cardio is following  - now on coreg 3.125 mg PO BID, amiodarone  Systolic CHF ; see NSTEMI   Pulmonary hypertension; see NSTEMI   Acute renal failure  - likely due to GI losses, dehydration, pre-renal etiology, creatinine now within normal limits - follow up CMP in am   Hypokalemia  - repleted with IV potassium we'll recheck at 1600     Anemia of chronic disease  - secondary to history of thyroid disease  - hemoglobin 10.4 --> 9.7 --> 8.8  - check CBC in a.m., transfuse if hemoglobin less than 7   Acute hypoxic respiratory failure  - unclear etiology  - CXR did not reveal acute cardiopulmonary porcess  - resp status stable; O2 saturation 100% on 2 L nasal canula   HYPOTHYROIDISM  - continue synthroid IV   ANXIETY  - ativan ordered IV PRN at bedtime   Incarcerated incisional  hernia  - surgery is following   Moderate protein-calorie malnutrition  - may improve once pt resumes oral intake   Hypokalemia -Repeat potassium, recheck potassium level at 1600 @ 11//09/2038 ADDENDUM; patient's 1600 labs show patient to have continued hypokalemia Will replete hypokalemia throughout the evening and recheck in Am -Patient's potassium goal> 4 meq/L   Code Status: Full  Family Communication:  Disposition Plan:    Consultants: Cardiology  Surgery   Procedures: S/P below Surgery 03/25/2013: LAPAROSCOPIC VENTRAL HERNIA REPAIR  LAPAROSCOPIC CHOLECYSTECTOMY WITH INTRAOPERATIVE CHOLANGIOGRAM  INSERTION OF MESH  LAPAROSCOPIC LYSIS OF ADHESIONS  PARTIAL OMENTECTOMY  Echocardiogram 03/23/2013] - Left ventricle: The cavity size was normal. Moderate concentric hypertrophy.  -Systolic function was moderately to severely reduced.  -LVEF; 30% to 35%. -Akinesis of the entireapical myocardium; Severe hypokinesis of the mid-distalanteroseptal, anterolateral, and inferior myocardium.  -Doppler parameters are consistent with abnormal left ventricular relaxation (grade 1 diastolic dysfunction). - Aortic valve: Moderate diffuse thickening and calcification, consistent with sclerosis. - Mitral valve: Severely calcified annulus. Mild regurgitation. - Atrial septum: There was increased thickness of the septum, consistent with lipomatous hypertrophy. - Tricuspid valve: Moderate regurgitation. - Pulmonary arteries: PA peak pressure: 37mm Hg (S). Pulmonary HTN     Antibiotics:  Flagyl 03/25/2013 -->  Cefazolin 03/25/2013 -->    HPI/Subjective: 79yo WF PMHx  Ulcerative Colitis, CMV colitis, hypertension, hypothyroidism, anxiety who presented to [facility] ED 03/22/2013 with complaints of intractable nausea and vomiting associated with poor oral intake over past 1 week prior to this admission. Patient was  subsequently found to have incarcerated hernia adn loop of small bowel within the  hernia based on CT abdomen. Please note that the reason why patient required SDU admission is because initial vital signs in ED were BP 70/35, HR 39-144 and T max 98.3 F. Oxygen saturation was 84% on room air. BP has improved with initial IV fluids to 143/80 (after 1.5 L NS) but then bounced back to 88/51.Patient however did not require pressor support. Hospital course is complicated with additional problem of A fib with RVR and NSTEMI. Patient has converted to NSR with digoxin and amiodarone but now requires amiodarone only per cardiology. In addition, pt S/P laparoscopic ventral hernia repair 03/25/2013. TODAY states negative N./V., positive pain with movement, positive bilateral hand swelling without pain secondary to peripheral IV lines, negative CP, negative SOB. States has not started taking sips of fluids yet.    Objective: Filed Vitals:   03/27/13 0420 03/27/13 0500 03/27/13 0600 03/27/13 0700  BP:      Pulse:  69 74 68  Temp: 98.6 F (37 C)     TempSrc: Oral     Resp:  15 13 0  Height:      Weight:      SpO2:  99% 99% 98%    Intake/Output Summary (Last 24 hours) at 03/27/13 0710 Last data filed at 03/27/13 0600  Gross per 24 hour  Intake 3759.1 ml  Output    531 ml  Net 3228.1 ml   Filed Weights   03/24/13 0452 03/25/13 0500 03/26/13 0500  Weight: 75.7 kg (166 lb 14.2 oz) 77.7 kg (171 lb 4.8 oz) 83 kg (182 lb 15.7 oz)    Exam:   General: A./O. x4, NAD  Cardiovascular: Regular rate, negative murmurs rubs or gallops, DP/PT pulses 1+ bilateral  Respiratory: Clear to auscultation bilateral  Abdomen: Patient has binder on abdomen, with pump in place over incision site midline (per RN lidocaine being dispensed). Mild tenderness to palpation, hypoactive bowel sounds.  Musculoskeletal: Port in place over right chest secondary to poor IV access   Data Reviewed: Basic Metabolic Panel:  Recent Labs Lab 03/22/13 1450 03/23/13 0245 03/24/13 0452 03/25/13 0515  03/25/13 1455 03/26/13 0900  NA 136 137 140 140 144 142  K 3.8 3.7 2.9* 3.1* 3.5 3.5  CL 100 104 111 112  --  112  CO2 16* 16* 18* 18*  --  15*  GLUCOSE 157* 127* 112* 87  --  112*  BUN 24* 21 12 9   --  12  CREATININE 1.25* 1.15* 0.95 0.86  --  0.90  CALCIUM 9.5 8.4 8.4 8.4  --  7.8*  MG  --   --  1.3*  --   --   --   PHOS  --   --  3.0  --   --   --    Liver Function Tests:  Recent Labs Lab 03/22/13 1450 03/24/13 0452  AST 19 11  ALT 9 7  ALKPHOS 65 51  BILITOT 0.8 0.4  PROT 5.8* 4.5*  ALBUMIN 2.8* 2.2*    Recent Labs Lab 03/22/13 1450  LIPASE 15   No results found for this basename: AMMONIA,  in the last 168 hours CBC:  Recent Labs Lab 03/22/13 1450 03/23/13 0245 03/24/13 0452 03/25/13 1455 03/26/13 0900 03/27/13 0120  WBC 2.8* 4.6 5.0  --  17.5* 12.9*  NEUTROABS 0.8*  --   --   --   --   --   HGB  10.4* 9.7* 8.8* 8.5* 9.3* 8.1*  HCT 30.5* 28.9* 26.7* 25.0* 28.5* 24.4*  MCV 89.2 90.6 92.7  --  93.1 91.7  PLT 257 258 189  --  209 176   Cardiac Enzymes:  Recent Labs Lab 03/22/13 1450 03/22/13 1909 03/23/13 0245 03/23/13 0750 03/23/13 1350  TROPONINI 0.87* 1.87* 1.23* 0.84* 0.62*   BNP (last 3 results)  Recent Labs  12/08/12 0026 03/27/13 0120  PROBNP 465.5* 5592.0*   CBG:  Recent Labs Lab 03/25/13 1220  GLUCAP 72    Recent Results (from the past 240 hour(s))  MRSA PCR SCREENING     Status: None   Collection Time    03/22/13  8:51 PM      Result Value Range Status   MRSA by PCR NEGATIVE  NEGATIVE Final   Comment:            The GeneXpert MRSA Assay (FDA     approved for NASAL specimens     only), is one component of a     comprehensive MRSA colonization     surveillance program. It is not     intended to diagnose MRSA     infection nor to guide or     monitor treatment for     MRSA infections.     Studies: Dg Cholangiogram Operative  03/25/2013   CLINICAL DATA:  Cholecystectomy for porcelain gallbladder.  EXAM:  INTRAOPERATIVE CHOLANGIOGRAM  TECHNIQUE: Cholangiographic images from the C-arm fluoroscopic device were submitted for interpretation post-operatively. Please see the procedural report for the amount of contrast and the fluoroscopy time utilized.  COMPARISON:  None.  FINDINGS: Intraoperative imaging with a C-arm demonstrates some initial extravasation of injected contrast at the level of the cystic duct. The biliary tree shows no evidence of filling defect or obstruction.  IMPRESSION: No filling defects identified by intraoperative cholangiography.   Electronically Signed   By: Irish Lack M.D.   On: 03/25/2013 16:53   Dg Chest Port 1 View  03/25/2013   CLINICAL DATA:  Small bowel obstruction  EXAM: PORTABLE CHEST - 1 VIEW  COMPARISON:  03/22/2013  FINDINGS: Stable right IJ Port-A-Cath position. NG tube in place. Borderline cardiomegaly. Mild left basilar atelectasis. No acute infiltrate or pulmonary edema.  IMPRESSION: NG tube in place. No active disease. Borderline cardiomegaly. Mild left basilar atelectasis.   Electronically Signed   By: Natasha Mead M.D.   On: 03/25/2013 08:59   Dg Abd Portable 2v  03/25/2013   CLINICAL DATA:  Followup small bowel obstruction  EXAM: PORTABLE ABDOMEN - 2 VIEW  COMPARISON:  02/10/2013  FINDINGS: Nasogastric tube tip is in the stomach. There is a IVC filter in place. Porcelain gallbladder is identified within the right upper quadrant of the abdomen. The bowel gas pattern is normal. There is no evidence of free air.  IMPRESSION: 1.  Nonobstructive bowel gas pattern.  2. Porcelain gallbladder  3. IVC filter.   Electronically Signed   By: Signa Kell M.D.   On: 03/25/2013 09:03    Scheduled Meds: . antiseptic oral rinse  15 mL Mouth Rinse q12n4p  . carvedilol  3.125 mg Oral BID WC  . ceFAZolin  2 g Intravenous Q8H  . chlorhexidine  15 mL Mouth Rinse BID  . [START ON 04/06/2013] cyanocobalamin  1,000 mcg Intramuscular Q30 days  . levothyroxine  62.5 mcg Intravenous  Daily  . lip balm  1 application Topical BID  . metroNIDAZOLE  500 mg Intravenous Q8H  . multivitamin  with minerals  1 tablet Oral Daily  . mycophenolate  1,000 mg Oral BID  . pantoprazole (PROTONIX) IV  40 mg Intravenous Q12H  . sodium chloride  3 mL Intravenous Q12H   Continuous Infusions: . sodium chloride 125 mL/hr at 03/26/13 2205  . amiodarone (NEXTERONE PREMIX) 360 mg/200 mL dextrose 30 mg/hr (03/27/13 0131)  . bupivacaine 0.25 % ON-Q pump DUAL CATH 300 mL      Principal Problem:   NSTEMI (non-ST elevated myocardial infarction) Active Problems:   HYPOTHYROIDISM   ANXIETY   Acute renal failure   Atrial fibrillation with RVR   N&V (nausea and vomiting)   Porcelain gallbladder   Incarcerated incisional hernia   Moderate protein-calorie malnutrition   Anemia of chronic disease   Acute respiratory failure with hypoxia   Protein-calorie malnutrition, severe   SBO (small bowel obstruction)    Time spent: 60 minutes    Analee Montee, J  [facility] Pager [phone]. If 7PM-7AM, please contact night-coverage at [email or web address], password [facility] 03/27/2013, 7:10 AM  LOS: 5 days

## 2013-03-27 NOTE — Progress Notes (Signed)
Feeling better.  Incisions closed well healed.  Pathology showed chronic cholecystitis but no cancer. Omentum inflamed but no evidence of any necrosis or abscess.  Tolerating some clamping. No flatus yet. Mobilizing more.  Okay to try clears with NG tube clamp. Would leave NG tube in until definite flatus.  Discuss with cardiology. Dr. Excell Seltzer feels safe for patient to go to floor. Telemetry.  I agree.  Deferred medicine.

## 2013-03-27 NOTE — Evaluation (Signed)
I have reviewed this note and agree with all findings. Kati Katie Faraone, PT, DPT Pager: [phone]   

## 2013-03-27 NOTE — Evaluation (Signed)
Physical Therapy Evaluation Patient Details Name: [name] MRN: [id number] DOB: 12/30/1924 Today's Date: 03/27/2013 Time: 4098-1191 PT Time Calculation (min): 16 min  PT Assessment / Plan / Recommendation History of Present Illness  Pt is an 79 year old female with past medical history of ulcerative colitis, CMV colitis, hypertension, hypothyroidism, anxiety who presented to [facility] ED 03/22/2013 with complaints of intractable nausea and vomiting associated with poor oral intake over past 1 week prior to this admission. Patient was subsequently found to have incarcerated hernia adn loop of small bowel within the hernia based on CT abdomen. Please note that the reason why patient required SDU admission is because initial vital signs in ED were BP 70/35, HR 39-144 and T max 98.3 F. Oxygen saturation was 84% on room air. BP has improved with initial IV fluids to 143/80 (after 1.5 L NS) but then bounced back to 88/51.Patient however did not require pressor support.  Hospital course is complicated with additional problem of A fib with RVR and NSTEMI. Patient has converted to NSR with digoxin and amiodarone but now requires amiodarone only per cardiology. In addition, pt underwent laparoscopic ventral hernia repair 03/25/2013.  Clinical Impression  Pt admitted with the above. Pt currently presenting with functional limitations due to deficits listed below (see PT Problem List. Pt would benefit from skilled PT to increase independence and safety during mobility. Pt able to perform bed mobility and stand pivot transfer with +2 assist due to weakness and to manage lines. PT recommends ST-SNF to improve functional mobility, with pt potentially progressing to HHPT and d/c home if family able to provide 24/7 assist for pt. Pt reports son-in-law is home 24/7 with pt.    PT Assessment  Patient needs continued PT services    Follow Up Recommendations  SNF    Does the patient have the potential to tolerate  intense rehabilitation      Barriers to Discharge        Equipment Recommendations  None recommended by PT    Recommendations for Other Services     Frequency Min 3X/week    Precautions / Restrictions Precautions Precautions: Fall Precaution Comments: multiple lines/tubes, NG tube, and A-line Restrictions Weight Bearing Restrictions: No   Pertinent Vitals/Pain No c/o pain during session. VSS during session.     Mobility  Bed Mobility Bed Mobility: Rolling Right;Right Sidelying to Sit;Sitting - Scoot to [employer] of Bed Rolling Right: 4: Min assist Right Sidelying to Sit: 4: Min assist Sitting - Scoot to Edge of Bed: 4: Min assist Details for Bed Mobility Assistance: min assist to guide trunk into sitting and +2 assist to manage lines. VC's for hand placement. PT utilized bed pad to assist pt to EOB. Transfers Transfers: Sit to Stand;Stand to [employer] Transfers Sit to Stand: With upper extremity assist;From bed;4: Min assist Stand to Sit: To chair/3-in-1;With upper extremity assist;With armrests;4: Min assist Stand Pivot Transfers: 4: Min assist Details for Transfer Assistance: +2 assist to ensure safety and to manage lines, min assist to rise and steady during sit to stand and min guard to min assist during stand pivot for safety. Ambulation/Gait Ambulation/Gait Assistance: Not tested (comment) Ambulation/Gait Assistance Details: not tested due to fatigue and weakness    Exercises     PT Diagnosis: Difficulty walking;Generalized weakness  PT Problem List: Decreased strength;Decreased activity tolerance;Decreased mobility;Decreased knowledge of use of DME PT Treatment Interventions: DME instruction;Gait training;Functional mobility training;Therapeutic activities;Therapeutic exercise;Patient/family education     PT Goals(Current goals can be found  in the care plan section) Acute Rehab PT Goals Patient Stated Goal: none specified PT Goal Formulation: With patient Time  For Goal Achievement: 04/10/13 Potential to Achieve Goals: Good  Visit Information  Last PT Received On: 03/27/13 Assistance Needed: +2 History of Present Illness: Pt is an 79 year old female with past medical history of ulcerative colitis, CMV colitis, hypertension, hypothyroidism, anxiety who presented to [facility] ED 03/22/2013 with complaints of intractable nausea and vomiting associated with poor oral intake over past 1 week prior to this admission. Patient was subsequently found to have incarcerated hernia adn loop of small bowel within the hernia based on CT abdomen. Please note that the reason why patient required SDU admission is because initial vital signs in ED were BP 70/35, HR 39-144 and T max 98.3 F. Oxygen saturation was 84% on room air. BP has improved with initial IV fluids to 143/80 (after 1.5 L NS) but then bounced back to 88/51.Patient however did not require pressor support.  Hospital course is complicated with additional problem of A fib with RVR and NSTEMI. Patient has converted to NSR with digoxin and amiodarone but now requires amiodarone only per cardiology. In addition, pt underwent laparoscopic ventral hernia repair 03/25/2013.       Prior Functioning  Home Living Family/patient expects to be discharged to:: Private residence Living Arrangements: Children Available Help at Discharge: Family;Available 24 hours/day (son-in-law home 24/7) Type of Home: House Home Access: Level entry Home Layout: Two level;Able to live on main level with bedroom/bathroom Home Equipment: Walker - 4 wheels;Bedside commode;Grab bars - toilet;Toilet riser Prior Function Level of Independence: Independent with assistive device(s) Comments: daughter washed hair Communication Communication: No difficulties    Cognition  Cognition Arousal/Alertness: Awake/alert Behavior During Therapy: WFL for tasks assessed/performed Overall Cognitive Status: Within Functional Limits for tasks assessed     Extremity/Trunk Assessment Lower Extremity Assessment Lower Extremity Assessment: Generalized weakness   Balance    End of Session PT - End of Session Activity Tolerance: Patient limited by fatigue Patient left: in chair;with family/visitor present;with call bell/phone within reach Nurse Communication: Mobility status;Other (comment) (PT asked RN to check A-line after mobility to ensure it is in correct position)  GP     Sol Blazing 03/27/2013, 1:43 PM

## 2013-03-27 NOTE — Progress Notes (Signed)
    Subjective:  Feels weak - has just been up out of bed. No chest pain or palpitations.  Objective:  Vital Signs in the last 24 hours: Temp:  [97.7 F (36.5 C)-98.6 F (37 C)] 97.7 F (36.5 C) (11/05 1200) Pulse Rate:  [60-96] 96 (11/05 1200) Resp:  [11-20] 15 (11/05 1200) BP: (94-150)/(33-57) 150/57 mmHg (11/05 0800) SpO2:  [98 %-100 %] 100 % (11/05 1200)  Intake/Output from previous day: 11/04 0701 - 11/05 0700 In: 3900.8 [I.V.:3420.8; NG/GT:30; IV Piggyback:450] Out: 631 [Urine:531; Emesis/NG output:100]  Physical Exam: Pt is alert and oriented, elderly woman in NAD HEENT: normal Neck: JVP - normal, carotids 2+= without bruits Lungs: CTA bilaterally CV: RRR without murmur or gallop Abd: soft, NT, Positive BS, no hepatomegaly Ext: mild pretibial edema and 2+ hand edema, distal pulses intact and equal Skin: warm/dry no rash  Lab Results:  Recent Labs  03/27/13 0120 03/27/13 1241  WBC 12.9* 16.1*  HGB 8.1* 8.8*  PLT 176 196    Recent Labs  03/27/13 0800 03/27/13 0910  NA 143 144  K 2.9* 2.9*  CL 116* 118*  CO2 15* 16*  GLUCOSE 115* 116*  BUN 14 14  CREATININE 0.95 0.84   No results found for this basename: TROPONINI, CK, MB,  in the last 72 hours  Cardiac Studies: 2D Echo: Study Conclusions  - Left ventricle: The cavity size was normal. There was moderate concentric hypertrophy. Systolic function was moderately to severely reduced. The estimated ejection fraction was in the range of 30% to 35%. There is akinesis of the entireapical myocardium. There is severe hypokinesis of the mid-distalanteroseptal, anterolateral, and inferior myocardium. There was an increased relative contribution of atrial contraction to ventricular filling. Doppler parameters are consistent with abnormal left ventricular relaxation (grade 1 diastolic dysfunction). - Aortic valve: Moderate diffuse thickening and calcification, consistent with sclerosis. - Mitral valve:  Severely calcified annulus. Mild regurgitation. - Atrial septum: There was increased thickness of the septum, consistent with lipomatous hypertrophy. - Tricuspid valve: Moderate regurgitation. - Pulmonary arteries: PA peak pressure: 37mm Hg (S). Impressions:  - The right ventricular systolic pressure was increased consistent with mild pulmonary hypertension.  Tele: Sinus rhythm with PVC's  Assessment/Plan:  1. Atrial fibrillation. Maintaining sinus rhythm on IV amiodarone. Convert to oral amiodarone. NG has been clamped all day. Will start with 400 mg BID and reduce to 200 mg daily in 1-2 weeks.   2. Cardiomyopathy with evidence of volume overload. The patient has been getting IVF's because of NPO status/post-op. Weight is markedly up as are I/O's. Would back off on fluids.  3. NSTEMI - suspect demand ischemia. No invasive evaluation considering advanced age and comorbid conditions.   Tonny Bollman, M.D. 03/27/2013, 2:05 PM

## 2013-03-28 DIAGNOSIS — I5020 Unspecified systolic (congestive) heart failure: Secondary | ICD-10-CM

## 2013-03-28 DIAGNOSIS — E440 Moderate protein-calorie malnutrition: Secondary | ICD-10-CM

## 2013-03-28 DIAGNOSIS — E876 Hypokalemia: Secondary | ICD-10-CM

## 2013-03-28 DIAGNOSIS — E43 Unspecified severe protein-calorie malnutrition: Secondary | ICD-10-CM

## 2013-03-28 LAB — CBC WITH DIFFERENTIAL/PLATELET
Basophils Absolute: 0 10*3/uL (ref 0.0–0.1)
Basophils Relative: 0 % (ref 0–1)
Eosinophils Absolute: 0 10*3/uL (ref 0.0–0.7)
Eosinophils Relative: 0 % (ref 0–5)
Eosinophils Relative: 0 % (ref 0–5)
HCT: 26.4 % — ABNORMAL LOW (ref 36.0–46.0)
Hemoglobin: 8.6 g/dL — ABNORMAL LOW (ref 12.0–15.0)
Lymphs Abs: 0.8 10*3/uL (ref 0.7–4.0)
Lymphs Abs: 0.8 10*3/uL (ref 0.7–4.0)
MCH: 30.3 pg (ref 26.0–34.0)
MCH: 30 pg (ref 26.0–34.0)
MCHC: 32.6 g/dL (ref 30.0–36.0)
MCV: 92 fL (ref 78.0–100.0)
Monocytes Absolute: 0.8 10*3/uL (ref 0.1–1.0)
Monocytes Absolute: 0.8 10*3/uL (ref 0.1–1.0)
Monocytes Relative: 6 % (ref 3–12)
Monocytes Relative: 5 % (ref 3–12)
Neutro Abs: 14.8 10*3/uL — ABNORMAL HIGH (ref 1.7–7.7)
Neutrophils Relative %: 88 % — ABNORMAL HIGH (ref 43–77)
Neutrophils Relative %: 90 % — ABNORMAL HIGH (ref 43–77)
Platelets: 158 10*3/uL (ref 150–400)
Platelets: 170 10*3/uL (ref 150–400)
RBC: 2.61 MIL/uL — ABNORMAL LOW (ref 3.87–5.11)
RBC: 2.87 MIL/uL — ABNORMAL LOW (ref 3.87–5.11)
RDW: 17 % — ABNORMAL HIGH (ref 11.5–15.5)
WBC Morphology: INCREASED
WBC: 13.4 10*3/uL — ABNORMAL HIGH (ref 4.0–10.5)
WBC: 16.4 10*3/uL — ABNORMAL HIGH (ref 4.0–10.5)

## 2013-03-28 LAB — BASIC METABOLIC PANEL
BUN: 13 mg/dL (ref 6–23)
Chloride: 118 mEq/L — ABNORMAL HIGH (ref 96–112)
Creatinine, Ser: 0.82 mg/dL (ref 0.50–1.10)
GFR calc Af Amer: 73 mL/min — ABNORMAL LOW (ref >90)
GFR calc non Af Amer: 63 mL/min — ABNORMAL LOW (ref >90)
Potassium: 3.5 mEq/L (ref 3.5–5.1)
Sodium: 143 mEq/L (ref 135–145)

## 2013-03-28 LAB — COMPREHENSIVE METABOLIC PANEL
ALT: <5 U/L (ref 0–35)
AST: 13 U/L (ref 0–37)
Albumin: 1.7 g/dL — ABNORMAL LOW (ref 3.5–5.2)
Alkaline Phosphatase: 52 U/L (ref 39–117)
BUN: 13 mg/dL (ref 6–23)
Chloride: 115 mEq/L — ABNORMAL HIGH (ref 96–112)
Creatinine, Ser: 0.88 mg/dL (ref 0.50–1.10)
GFR calc non Af Amer: 58 mL/min — ABNORMAL LOW (ref >90)
Sodium: 143 mEq/L (ref 135–145)
Total Protein: 4.1 g/dL — ABNORMAL LOW (ref 6.0–8.3)

## 2013-03-28 LAB — MAGNESIUM: Magnesium: 1.3 mg/dL — ABNORMAL LOW (ref 1.5–2.5)

## 2013-03-28 MED ORDER — FERROUS SULFATE 325 (65 FE) MG PO TABS
325.0000 mg | ORAL_TABLET | Freq: Three times a day (TID) | ORAL | Status: DC
  Administered 2013-03-29 – 2013-04-02 (×14): 325 mg via ORAL
  Filled 2013-03-28 (×16): qty 1

## 2013-03-28 MED ORDER — LEVOTHYROXINE SODIUM 125 MCG PO TABS
125.0000 ug | ORAL_TABLET | Freq: Every day | ORAL | Status: DC
  Administered 2013-03-29 – 2013-04-02 (×5): 125 ug via ORAL
  Filled 2013-03-28 (×6): qty 1

## 2013-03-28 MED ORDER — ATORVASTATIN CALCIUM 20 MG PO TABS
20.0000 mg | ORAL_TABLET | Freq: Every day | ORAL | Status: DC
  Administered 2013-03-28 – 2013-04-01 (×5): 20 mg via ORAL
  Filled 2013-03-28 (×6): qty 1

## 2013-03-28 MED ORDER — FAMOTIDINE 20 MG PO TABS
20.0000 mg | ORAL_TABLET | Freq: Two times a day (BID) | ORAL | Status: DC
  Administered 2013-03-28 – 2013-04-02 (×10): 20 mg via ORAL
  Filled 2013-03-28 (×11): qty 1

## 2013-03-28 MED ORDER — ALPRAZOLAM 0.25 MG PO TABS
0.2500 mg | ORAL_TABLET | Freq: Two times a day (BID) | ORAL | Status: DC | PRN
  Administered 2013-03-28: 0.25 mg via ORAL
  Filled 2013-03-28: qty 1

## 2013-03-28 MED ORDER — BUPROPION HCL ER (XL) 150 MG PO TB24
150.0000 mg | ORAL_TABLET | Freq: Every day | ORAL | Status: DC
  Administered 2013-03-28 – 2013-04-02 (×6): 150 mg via ORAL
  Filled 2013-03-28 (×6): qty 1

## 2013-03-28 MED ORDER — PSYLLIUM 95 % PO PACK
1.0000 | PACK | Freq: Two times a day (BID) | ORAL | Status: DC
  Administered 2013-03-28 – 2013-04-02 (×9): 1 via ORAL
  Filled 2013-03-28 (×11): qty 1

## 2013-03-28 MED ORDER — SACCHAROMYCES BOULARDII 250 MG PO CAPS
250.0000 mg | ORAL_CAPSULE | Freq: Two times a day (BID) | ORAL | Status: DC
  Administered 2013-03-28 – 2013-04-02 (×10): 250 mg via ORAL
  Filled 2013-03-28 (×11): qty 1

## 2013-03-28 MED ORDER — BOOST / RESOURCE BREEZE PO LIQD
1.0000 | Freq: Two times a day (BID) | ORAL | Status: DC
  Administered 2013-03-28: 1 via ORAL

## 2013-03-28 MED ORDER — ALPRAZOLAM 1 MG PO TABS
1.0000 mg | ORAL_TABLET | Freq: Every evening | ORAL | Status: DC | PRN
  Administered 2013-03-28 – 2013-04-01 (×5): 1 mg via ORAL
  Filled 2013-03-28 (×5): qty 1

## 2013-03-28 MED ORDER — BISMUTH SUBSALICYLATE 262 MG/15ML PO SUSP
30.0000 mL | Freq: Three times a day (TID) | ORAL | Status: DC | PRN
  Filled 2013-03-28: qty 236

## 2013-03-28 MED ORDER — LEVOTHYROXINE SODIUM 125 MCG PO TABS
125.0000 ug | ORAL_TABLET | Freq: Every day | ORAL | Status: DC
  Administered 2013-03-28: 125 ug via ORAL
  Filled 2013-03-28 (×2): qty 1

## 2013-03-28 MED ORDER — ENOXAPARIN SODIUM 40 MG/0.4ML ~~LOC~~ SOLN
40.0000 mg | SUBCUTANEOUS | Status: DC
  Administered 2013-03-28 – 2013-04-02 (×6): 40 mg via SUBCUTANEOUS
  Filled 2013-03-28 (×6): qty 0.4

## 2013-03-28 MED ORDER — VITAMIN C 250 MG PO TABS: 250.0000 mg | ORAL_TABLET | Freq: Every day | ORAL | Status: DC

## 2013-03-28 MED ORDER — VITAMIN C 500 MG PO TABS
500.0000 mg | ORAL_TABLET | Freq: Every day | ORAL | Status: DC
  Administered 2013-03-28 – 2013-04-02 (×6): 500 mg via ORAL
  Filled 2013-03-28 (×6): qty 1

## 2013-03-28 MED ORDER — MAGNESIUM SULFATE IN D5W 10-5 MG/ML-% IV SOLN
1.0000 g | Freq: Once | INTRAVENOUS | Status: AC
  Administered 2013-03-28: 1 g via INTRAVENOUS
  Filled 2013-03-28: qty 100

## 2013-03-28 MED ORDER — ASPIRIN 81 MG PO CHEW
81.0000 mg | CHEWABLE_TABLET | Freq: Every day | ORAL | Status: DC
  Administered 2013-03-28 – 2013-04-02 (×6): 81 mg via ORAL
  Filled 2013-03-28 (×6): qty 1

## 2013-03-28 MED ORDER — POTASSIUM CHLORIDE CRYS ER 20 MEQ PO TBCR
40.0000 meq | EXTENDED_RELEASE_TABLET | Freq: Every day | ORAL | Status: DC
  Administered 2013-03-28 – 2013-03-30 (×3): 40 meq via ORAL
  Filled 2013-03-28 (×4): qty 2

## 2013-03-28 NOTE — Progress Notes (Signed)
    Subjective:  Rough night - lots of diarrhea per patient. No chest pain or palpitations.   Objective:  Vital Signs in the last 24 hours: Temp:  [97.7 F (36.5 C)-98.3 F (36.8 C)] 97.9 F (36.6 C) (11/06 0800) Pulse Rate:  [58-113] 66 (11/06 0812) Resp:  [13-21] 18 (11/06 0800) BP: (93-130)/(50-57) 123/57 mmHg (11/06 0812) SpO2:  [82 %-100 %] 99 % (11/06 0800)  Intake/Output from previous day: 11/05 0701 - 11/06 0700 In: 3082.5 [P.O.:60; I.V.:1622.5; NG/GT:50; IV Piggyback:1350] Out: 750 [Urine:720; Emesis/NG output:30]  Physical Exam: Pt is alert and oriented, elderly woman in NAD HEENT: normal Neck: JVP - normal Lungs: CTA bilaterally CV: RRR without murmur or gallop Abd: soft, NT, Positive BS, no hepatomegaly Ext: hand and arm edema noted, distal pulses intact and equal Skin: warm/dry no rash   Lab Results:  Recent Labs  03/27/13 1241 03/28/13 0240  WBC 16.1* 13.4*  HGB 8.8* 7.9*  PLT 196 158    Recent Labs  03/27/13 0910  03/27/13 1830 03/28/13 0240  NA 144  --   --  143  K 2.9*  < > 3.0* 3.5  CL 118*  --   --  118*  CO2 16*  --   --  13*  GLUCOSE 116*  --   --  89  BUN 14  --   --  13  CREATININE 0.84  --   --  0.82  < > = values in this interval not displayed. No results found for this basename: TROPONINI, CK, MB,  in the last 72 hours  Tele: Sinus rhythm, personally reviewed  Assessment/Plan:  1. Atrial fibrillation - maintaining sinus rhythm on oral amiodarone 2. Cardiomyopathy - IV fluids stopped. Still hypokalemic. Will need IV diuresis at some point but lungs clear and not taking much PO. Hold off on lasix for now. 3. NSTEMI - conservative Rx recommended. On beta-blocker. Will start ASA 81 mg daily. Hold statin while she is sick in the hospital with GI problems. I think risk outweighs benefit.  Tonny Bollman, M.D. 03/28/2013, 9:26 AM

## 2013-03-28 NOTE — Progress Notes (Signed)
Diarrhea yest - struggles with that preop Afraid to get up Tol liquids well - no n/v  General: Pt awake/alert/oriented x4 in no major acute distress Eyes: PERRL, normal EOM. Sclera nonicteric Neuro: CN II-XII intact w/o focal sensory/motor deficits. Lymph: No head/neck/groin lymphadenopathy Psych:  No delerium/psychosis/paranoia HENT: Normocephalic, Mucus membranes moist.  No thrush Neck: Supple, No tracheal deviation Chest: No pain.  Good respiratory excursion. CV:  Pulses intact.  Regular rhythm MS: Normal AROM mjr joints.  No obvious deformity Abdomen: Soft, Nondistended.  Min tender at incisions only.  No incarcerated hernias.  Dressings clean.  OnQ in place Ext:  SCDs BLE.  No significant edema.  No cyanosis Skin: No petechiae / purpura  Improved Post obstructive diarrhea expected - d/c rectal tube in AM D/c foley GET HER UP!! OK to transfer to floor - DEFER TO PRIMARY MEDICINE SERVICE Adv diet Low K - replace - get back on daily PO Anemia - add iron (constipating as well)

## 2013-03-28 NOTE — Progress Notes (Signed)
NUTRITION FOLLOW UP  Intervention:   Provide Resource Breeze po BID, each supplement provides 250 kcal and 9 grams of protein. Diet advancement per MD discretion; recommend advancing to full liquids ASAP Continue Multivitamin with minerals daily RD to continue to monitor nutrition care plan  Nutrition Dx:   Inadequate oral intake related to altered GI function as evidenced by NPO status; ongoing- diet advanced to clear liquids, inadequate intake continues  Goal:   Pt to meet >/= 90% of their estimated nutrition needs; not met  Monitor:   Diet advancement; advanced to clear liquids 11/5 PM Labs; potassium low, albumin low, low hemoglobin, low GFR Weight; up 16 lb from admission wt  Assessment:   Diet advanced to clear liquids last night. Pt denies abdominal pain, nausea, and vomiting. Pt complaining of diarrhea. Pt reports having tray of clear liquids for breakfast but, no lunch. Diarrhea not likely to improve on clear liquid diet due to high sugar content; diet also very low in protein and calories- pt would greatly benefit from diet advancing to full liquids.   Height: Ht Readings from Last 1 Encounters:  03/22/13 5\' 5"  (1.651 m)    Weight Status:   Wt Readings from Last 1 Encounters:  03/26/13 182 lb 15.7 oz (83 kg)    Re-estimated needs:  Kcal: 1600-1800  Protein: 90-100 gms  Fluid: 1.95 L daily  Skin: stage 2 pressure ulcer on right buttocks; two abdominal incisions; +2 generalized edema, + 2 RUE and LUE edema, and +1 RLE and LLE edema  Diet Order: Clear Liquid   Intake/Output Summary (Last 24 hours) at 03/28/13 1337 Last data filed at 03/28/13 0819  Gross per 24 hour  Intake   1921 ml  Output    605 ml  Net   1316 ml    Last BM: 11/6, diarrhea   Labs:   Recent Labs Lab 03/24/13 0452  03/27/13 0910  03/27/13 1830 03/28/13 0240 03/28/13 0935  NA 140  < > 144  --   --  143 143  K 2.9*  < > 2.9*  < > 3.0* 3.5 3.3*  CL 111  < > 118*  --   --  118* 115*   CO2 18*  < > 16*  --   --  13* 14*  BUN 12  < > 14  --   --  13 13  CREATININE 0.95  < > 0.84  --   --  0.82 0.88  CALCIUM 8.4  < > 7.7*  --   --  7.8* 8.4  MG 1.3*  --  1.3*  --   --  1.3* 1.7  PHOS 3.0  --   --   --   --   --   --   GLUCOSE 112*  < > 116*  --   --  89 97  < > = values in this interval not displayed.  CBG (last 3)  No results found for this basename: GLUCAP,  in the last 72 hours  Scheduled Meds: . amiodarone  400 mg Oral BID  . antiseptic oral rinse  15 mL Mouth Rinse q12n4p  . aspirin  81 mg Oral Daily  . carvedilol  3.125 mg Oral BID WC  . ceFAZolin  2 g Intravenous Q8H  . chlorhexidine  15 mL Mouth Rinse BID  . [START ON 04/06/2013] cyanocobalamin  1,000 mcg Intramuscular Q30 days  . enoxaparin (LOVENOX) injection  40 mg Subcutaneous Q24H  . feeding supplement (RESOURCE  BREEZE)  1 Container Oral BID BM  . levothyroxine  125 mcg Oral QAC breakfast  . lip balm  1 application Topical BID  . metroNIDAZOLE  500 mg Intravenous Q8H  . multivitamin with minerals  1 tablet Oral Daily  . mycophenolate  1,000 mg Oral BID  . pantoprazole (PROTONIX) IV  40 mg Intravenous Q12H  . sodium chloride  3 mL Intravenous Q12H    Continuous Infusions: . sodium chloride 20 mL/hr (03/28/13 0819)  . bupivacaine 0.25 % ON-Q pump DUAL CATH 300 mL      Ian Malkin RD, LDN Inpatient Clinical Dietitian Pager: [phone] After Hours Pager: [phone]

## 2013-03-28 NOTE — Progress Notes (Signed)
Clinical Social Work Department BRIEF PSYCHOSOCIAL ASSESSMENT 03/28/2013  Patient:  [name], [name]     Account Number:  [id number]     Admit date:  03/22/2013  Clinical Social Worker:  Orpah Greek  Date/Time:  03/28/2013 02:21 PM  Referred by:  Physician  Date Referred:  03/28/2013 Referred for  SNF Placement   Other Referral:   Interview type:  Patient Other interview type:    PSYCHOSOCIAL DATA Living Status:  WITH ADULT CHILDREN Admitted from facility:   Level of care:   Primary support name:  Delfin Edis (dtr) ph#: [phone]  [phone] Primary support relationship to patient:  CHILD, ADULT Degree of support available:   good    CURRENT CONCERNS Current Concerns  Post-Acute Placement   Other Concerns:    SOCIAL WORK ASSESSMENT / PLAN CSW received consult that patient will need SNF at discharge, per PT evaluation.   Assessment/plan status:  Information/Referral to [facility] Other assessment/ plan:   Information/referral to community resources:   CSW completed FL2 and faxed information out to [facility] & provided bed offers to patient.    PATIENT'S/FAMILY'S RESPONSE TO PLAN OF CARE: Patient seemed resistant to idea of SNF at discharge, stating that her late husband had been to a rehab facility before and had a bad experience. CSW reassured patient that she would have the option of choosing the facility and might even progress in the hospital to where SNF would not be needed. CSW will follow-up.       Unice Bailey, LCSW [facility] Clinical Social Worker cell #: [phone]

## 2013-03-28 NOTE — Progress Notes (Signed)
[facility] PROGRESS NOTE  [name] ZOX:[id number] DOB: 12/01/1924 DOA: 03/22/2013 PCP: Nelwyn Salisbury, MD  Assessment/Plan: Incarcerated hernia and small bowel within the hernia/ N&V (nausea and vomiting)  - likely related to loop of small bowel within the incarcerated hernia seen on CT abdomen  - status post laparoscopic ventral hernia repair 03/25/2013 and no subsequent complications  - NS@ KVO per surgery patient will will try sips of clears today - transfuse for hemoglobin of less than 7    Atrial fibrillation with RVR  - Continue Coreg 3.125 mg, and PO Amiodarone. Currently in NSR  - no anticoagulation at this time, only SCDs for DVT prophylaxis per cardio   NSTEMI (non-ST elevated myocardial infarction)  - cardiac enzymes trended down, 1.23 --> 0.62; currently patient asymptomatic (negative CP/SOB)  - 2 D ECHO on this admission - EF 30% to 35%; akinesisof the entire apical myocardium; severe hypokinesis of the mid-distalanteroseptal, anterolateral and inferior myocardium; grade 1 diastolic dysfunction  - cardio is following  - now on coreg 3.125 mg PO BID, amiodarone  Systolic CHF ; see NSTEMI   Pulmonary hypertension; see NSTEMI   Acute renal failure  - likely due to GI losses, dehydration, pre-renal etiology, creatinine now within normal limits - follow up CMP in am   Hypokalemia -Repeat potassium, recheck potassium level at 2130 @ 11//10/2012 -Patient's potassium goal> 4 meq/L  -Recheck magnesium level. Magnesium goal> 2 mg/dL  Anemia of chronic disease  - secondary to history of thyroid disease  - hemoglobin 10.4 --> 9.7 --> 8.6; stable continue to monitor     Acute hypoxic respiratory failure  - unclear etiology  - CXR did not reveal acute cardiopulmonary porcess  - resp status stable; O2 saturation 100% on room air  HYPOTHYROIDISM  -Change Synthroid to by mouth   ANXIETY  - Surgery DC'd Ativan and startesd patient on Xanax 1mg  each  bedtime, 0.25mg  BID PRN  Incarcerated incisional hernia  - surgery is following   Moderate protein-calorie malnutrition  - may improve once pt resumes oral intake     Diarrhea; surgery has DC'd antibiotics, will check Clostridium difficile PCR    Code Status: Full  Family Communication:  Disposition Plan:    Consultants: Cardiology  Surgery   Procedures: S/P below Surgery 03/25/2013: LAPAROSCOPIC VENTRAL HERNIA REPAIR  LAPAROSCOPIC CHOLECYSTECTOMY WITH INTRAOPERATIVE CHOLANGIOGRAM  INSERTION OF MESH  LAPAROSCOPIC LYSIS OF ADHESIONS  PARTIAL OMENTECTOMY  Echocardiogram 03/23/2013] - Left ventricle: The cavity size was normal. Moderate concentric hypertrophy.  -Systolic function was moderately to severely reduced.  -LVEF; 30% to 35%. -Akinesis of the entireapical myocardium; Severe hypokinesis of the mid-distalanteroseptal, anterolateral, and inferior myocardium.  -Doppler parameters are consistent with abnormal left ventricular relaxation (grade 1 diastolic dysfunction). - Aortic valve: Moderate diffuse thickening and calcification, consistent with sclerosis. - Mitral valve: Severely calcified annulus. Mild regurgitation. - Atrial septum: There was increased thickness of the septum, consistent with lipomatous hypertrophy. - Tricuspid valve: Moderate regurgitation. - Pulmonary arteries: PA peak pressure: 37mm Hg (S). Pulmonary HTN     Antibiotics:  Flagyl 03/25/2013 --> stopped 11/6 Cefazolin 03/25/2013 --> stopped 11/6    HPI/Subjective: 79yo WF PMHx  Ulcerative Colitis, CMV colitis, hypertension, hypothyroidism, anxiety who presented to [facility] ED 03/22/2013 with complaints of intractable nausea and vomiting associated with poor oral intake over past 1 week prior to this admission. Patient was subsequently found to have incarcerated hernia adn loop of small bowel within the hernia based on CT abdomen. Please note that  the reason why patient required SDU admission is  because initial vital signs in ED were BP 70/35, HR 39-144 and T max 98.3 F. Oxygen saturation was 84% on room air. BP has improved with initial IV fluids to 143/80 (after 1.5 L NS) but then bounced back to 88/51.Patient however did not require pressor support. Hospital course is complicated with additional problem of A fib with RVR and NSTEMI. Patient has converted to NSR with digoxin and amiodarone but now requires amiodarone only per cardiology. In addition, pt S/P laparoscopic ventral hernia repair 03/25/2013. 03/27/2013 states negative N./V., positive pain with movement, positive bilateral hand swelling without pain secondary to peripheral IV lines, negative CP, negative SOB. States has not started taking sips of fluids yet. TODAY states (-) N/V, states is taking several sips of fluid without adverse effects, except for a started to have diarrhea. States had similar episodes of diarrhea prior to surgery which has started to clear.   Objective: Filed Vitals:   03/28/13 0400 03/28/13 0500 03/28/13 0600 03/28/13 0700  BP: 93/50     Pulse: 58 64 68 60  Temp: 97.9 F (36.6 C)     TempSrc: Oral     Resp: 13 18 18 14   Height:      Weight:      SpO2: 99% 100% 98% 98%    Intake/Output Summary (Last 24 hours) at 03/28/13 0809 Last data filed at 03/28/13 0700  Gross per 24 hour  Intake 2890.75 ml  Output    710 ml  Net 2180.75 ml   Filed Weights   03/24/13 0452 03/25/13 0500 03/26/13 0500  Weight: 75.7 kg (166 lb 14.2 oz) 77.7 kg (171 lb 4.8 oz) 83 kg (182 lb 15.7 oz)    Exam:   General: A./O. x4, NAD  Cardiovascular: Regular rate, negative murmurs rubs or gallops, DP/PT pulses 1+ bilateral  Respiratory: Clear to auscultation bilateral  Abdomen: Patient has binder on abdomen, with pump in place over incision site midline (per RN lidocaine being dispensed). Mild tenderness to palpation, hypoactive bowel sounds.  Musculoskeletal: Port in place over right chest secondary to poor IV  access, negative signs of infection       Data Reviewed: Basic Metabolic Panel:  Recent Labs Lab 03/24/13 0452 03/25/13 0515 03/25/13 1455 03/26/13 0900 03/27/13 0800 03/27/13 0910 03/27/13 1715 03/27/13 1830 03/28/13 0240  NA 140 140 144 142 143 144  --   --  143  K 2.9* 3.1* 3.5 3.5 2.9* 2.9* 2.4* 3.0* 3.5  CL 111 112  --  112 116* 118*  --   --  118*  CO2 18* 18*  --  15* 15* 16*  --   --  13*  GLUCOSE 112* 87  --  112* 115* 116*  --   --  89  BUN 12 9  --  12 14 14   --   --  13  CREATININE 0.95 0.86  --  0.90 0.95 0.84  --   --  0.82  CALCIUM 8.4 8.4  --  7.8* 8.1* 7.7*  --   --  7.8*  MG 1.3*  --   --   --   --  1.3*  --   --  1.3*  PHOS 3.0  --   --   --   --   --   --   --   --    Liver Function Tests:  Recent Labs Lab 03/22/13 1450 03/24/13 0452 03/27/13 0910  AST  19 11 17   ALT 9 7 6   ALKPHOS 65 51 49  BILITOT 0.8 0.4 0.2*  PROT 5.8* 4.5* 3.8*  ALBUMIN 2.8* 2.2* 1.5*    Recent Labs Lab 03/22/13 1450  LIPASE 15   No results found for this basename: AMMONIA,  in the last 168 hours CBC:  Recent Labs Lab 03/22/13 1450  03/24/13 0452 03/25/13 1455 03/26/13 0900 03/27/13 0120 03/27/13 1241 03/28/13 0240  WBC 2.8*  < > 5.0  --  17.5* 12.9* 16.1* 13.4*  NEUTROABS 0.8*  --   --   --   --   --  14.2* 11.8*  HGB 10.4*  < > 8.8* 8.5* 9.3* 8.1* 8.8* 7.9*  HCT 30.5*  < > 26.7* 25.0* 28.5* 24.4* 26.5* 23.8*  MCV 89.2  < > 92.7  --  93.1 91.7 91.7 91.2  PLT 257  < > 189  --  209 176 196 158  < > = values in this interval not displayed. Cardiac Enzymes:  Recent Labs Lab 03/22/13 1450 03/22/13 1909 03/23/13 0245 03/23/13 0750 03/23/13 1350  TROPONINI 0.87* 1.87* 1.23* 0.84* 0.62*   BNP (last 3 results)  Recent Labs  12/08/12 0026 03/27/13 0120  PROBNP 465.5* 5592.0*   CBG:  Recent Labs Lab 03/25/13 1220  GLUCAP 72    Recent Results (from the past 240 hour(s))  MRSA PCR SCREENING     Status: None   Collection Time    03/22/13   8:51 PM      Result Value Range Status   MRSA by PCR NEGATIVE  NEGATIVE Final   Comment:            The GeneXpert MRSA Assay (FDA     approved for NASAL specimens     only), is one component of a     comprehensive MRSA colonization     surveillance program. It is not     intended to diagnose MRSA     infection nor to guide or     monitor treatment for     MRSA infections.     Studies: Dg Abd Portable 1v  03/27/2013   CLINICAL DATA:  Diarrhea, abdominal pain, recent cholecystectomy  EXAM: PORTABLE ABDOMEN - 1 VIEW  COMPARISON:  03/25/2013  FINDINGS: Scattered air and stool throughout the bowel. Negative for obstruction or ileus. Degenerative changes of the spine with mild scoliosis. IVC filter noted at the L3 level. Atherosclerosis of the aorta. Bones are osteopenic. Degenerative changes of the pelvis and hips.  IMPRESSION: Nonspecific mild gaseous distention of the bowel but no obstruction or ileus.   Electronically Signed   By: Ruel Favors M.D.   On: 03/27/2013 20:30    Scheduled Meds: . amiodarone  400 mg Oral BID  . antiseptic oral rinse  15 mL Mouth Rinse q12n4p  . carvedilol  3.125 mg Oral BID WC  . ceFAZolin  2 g Intravenous Q8H  . chlorhexidine  15 mL Mouth Rinse BID  . [START ON 04/06/2013] cyanocobalamin  1,000 mcg Intramuscular Q30 days  . levothyroxine  62.5 mcg Intravenous Daily  . lip balm  1 application Topical BID  . metroNIDAZOLE  500 mg Intravenous Q8H  . multivitamin with minerals  1 tablet Oral Daily  . mycophenolate  1,000 mg Oral BID  . pantoprazole (PROTONIX) IV  40 mg Intravenous Q12H  . sodium chloride  3 mL Intravenous Q12H   Continuous Infusions: . sodium chloride 75 mL/hr at 03/28/13 0330  . bupivacaine 0.25 %  ON-Q pump DUAL CATH 300 mL      Principal Problem:   NSTEMI (non-ST elevated myocardial infarction) Active Problems:   HYPOTHYROIDISM   ANXIETY   Acute renal failure   Atrial fibrillation with RVR   N&V (nausea and vomiting)    Porcelain gallbladder   Incarcerated incisional hernia   Moderate protein-calorie malnutrition   Anemia of chronic disease   Acute respiratory failure with hypoxia   Protein-calorie malnutrition, severe   SBO (small bowel obstruction)   Systolic CHF   Pulmonary hypertension   Hypokalemia    Time spent: 40 minutes    Haidan Nhan, J  [facility] Pager [phone]. If 7PM-7AM, please contact night-coverage at [email or web address], password [email or web address] 03/28/2013, 8:09 AM  LOS: 6 days

## 2013-03-28 NOTE — Progress Notes (Signed)
3 Days Post-Op  Subjective: Pt states she had a bad night.  Started having lots of loose BM's, thus a rectal tube inserted.  She doesn't feel well, but denies much abdominal pain.  No N/V.  Hasn't had any clear liquids yet.  Worked with PT/OT yesterday and they are recommending SNF.  Objective: Vital signs in last 24 hours: Temp:  [97.7 F (36.5 C)-98.3 F (36.8 C)] 97.9 F (36.6 C) (11/06 0400) Pulse Rate:  [58-113] 68 (11/06 0600) Resp:  [13-21] 18 (11/06 0600) BP: (93-150)/(50-57) 93/50 mmHg (11/06 0400) SpO2:  [82 %-100 %] 98 % (11/06 0600) Last BM Date: 03/27/13  Intake/Output from previous day: 11/05 0701 - 11/06 0700 In: 2957.5 [P.O.:60; I.V.:1547.5; NG/GT:50; IV Piggyback:1300] Out: 750 [Urine:720; Emesis/NG output:30] Intake/Output this shift:    PE: Gen:  Alert, NAD, pleasant Card:  RRR, no M/G/R heard Pulm:  CTA, no W/R/R Abd: Soft, NT/ND, +BS, no HSM, incisions C/D/I, drain with minimal sanguinous drainage, no abdominal scars noted Ext:  Edematous b/l  Lab Results:   Recent Labs  03/27/13 1241 03/28/13 0240  WBC 16.1* 13.4*  HGB 8.8* 7.9*  HCT 26.5* 23.8*  PLT 196 158   BMET  Recent Labs  03/27/13 0910  03/27/13 1830 03/28/13 0240  NA 144  --   --  143  K 2.9*  < > 3.0* 3.5  CL 118*  --   --  118*  CO2 16*  --   --  13*  GLUCOSE 116*  --   --  89  BUN 14  --   --  13  CREATININE 0.84  --   --  0.82  CALCIUM 7.7*  --   --  7.8*  < > = values in this interval not displayed. PT/INR No results found for this basename: LABPROT, INR,  in the last 72 hours CMP     Component Value Date/Time   NA 143 03/28/2013 0240   K 3.5 03/28/2013 0240   CL 118* 03/28/2013 0240   CO2 13* 03/28/2013 0240   GLUCOSE 89 03/28/2013 0240   BUN 13 03/28/2013 0240   CREATININE 0.82 03/28/2013 0240   CALCIUM 7.8* 03/28/2013 0240   PROT 3.8* 03/27/2013 0910   ALBUMIN 1.5* 03/27/2013 0910   AST 17 03/27/2013 0910   ALT 6 03/27/2013 0910   ALKPHOS 49 03/27/2013 0910   BILITOT  0.2* 03/27/2013 0910   GFRNONAA 63* 03/28/2013 0240   GFRAA 73* 03/28/2013 0240   Lipase     Component Value Date/Time   LIPASE 15 03/22/2013 1450       Studies/Results: Dg Abd Portable 1v  03/27/2013   CLINICAL DATA:  Diarrhea, abdominal pain, recent cholecystectomy  EXAM: PORTABLE ABDOMEN - 1 VIEW  COMPARISON:  03/25/2013  FINDINGS: Scattered air and stool throughout the bowel. Negative for obstruction or ileus. Degenerative changes of the spine with mild scoliosis. IVC filter noted at the L3 level. Atherosclerosis of the aorta. Bones are osteopenic. Degenerative changes of the pelvis and hips.  IMPRESSION: Nonspecific mild gaseous distention of the bowel but no obstruction or ileus.   Electronically Signed   By: Ruel Favors M.D.   On: 03/27/2013 20:30    Anti-infectives: Anti-infectives   Start     Dose/Rate Route Frequency Ordered Stop   03/26/13 0600  ceFAZolin (ANCEF) IVPB 2 g/50 mL premix     2 g 100 mL/hr over 30 Minutes Intravenous On call to O.R. 03/25/13 1259 03/25/13 1356   03/26/13  0600  metroNIDAZOLE (FLAGYL) IVPB 500 mg     500 mg 100 mL/hr over 60 Minutes Intravenous On call to O.R. 03/25/13 1259 03/25/13 1412   03/25/13 2200  ceFAZolin (ANCEF) IVPB 2 g/50 mL premix     2 g 100 mL/hr over 30 Minutes Intravenous 3 times per day 03/25/13 1859 03/28/13 2159   03/25/13 2200  metroNIDAZOLE (FLAGYL) IVPB 500 mg     500 mg 100 mL/hr over 60 Minutes Intravenous 3 times per day 03/25/13 1859 03/28/13 2159   03/25/13 1515  clindamycin (CLEOCIN) 900 mg, gentamicin (GARAMYCIN) 240 mg in sodium chloride 0.9 % 1,000 mL for intraperitoneal lavage      Intraperitoneal To Surgery 03/25/13 1507 03/25/13 1720       Assessment/Plan POD #3 s/p laparoscopic ventral hernia repair, laparoscopic chole with IOC, insertion of mesh, LOA, partial omentectomy  1. Successful clamping trials yesterday, start clear liquids, KUB yesterday with mild gaseous distension but no obstruction or  ileus 2. IVF, pain control, antiemetics  3. Appreciate cardiology's recommendations  4. VTE proph - SCD's, start lovenox 5. IS and ambulate OOB - PT/OT working with pt  6. On oral amiodarone okay to transfer to the floor from our perspective 7. On Cefazolin and flagyl, will discuss with Dr. Michaell Cowing about length of treatment 8. D/c On Q pain pump today and place dry dressing, d/c foley  AFIB, cardiomyopathy, and NSTEMI being managed by cardiology - appreciate their help Hypokalemia/Hypomag - being managed by primary team Leukocytosis - 13.4, unknown etiology, but unlikely abdominal cause based on my exam, check UA  Disp:  To SNF when medically/surgically stable for d/c     LOS: 6 days    [name], [name] 03/28/2013, 7:30 AM Pager: [phone]

## 2013-03-29 DIAGNOSIS — I214 Non-ST elevation (NSTEMI) myocardial infarction: Secondary | ICD-10-CM

## 2013-03-29 LAB — TYPE AND SCREEN
Antibody Screen: NEGATIVE
Unit division: 0

## 2013-03-29 LAB — CBC WITH DIFFERENTIAL/PLATELET
Basophils Absolute: 0 10*3/uL (ref 0.0–0.1)
Eosinophils Absolute: 0.1 10*3/uL (ref 0.0–0.7)
Eosinophils Relative: 1 % (ref 0–5)
Hemoglobin: 7.9 g/dL — ABNORMAL LOW (ref 12.0–15.0)
Lymphs Abs: 0.7 10*3/uL (ref 0.7–4.0)
MCH: 30.4 pg (ref 26.0–34.0)
MCV: 91.2 fL (ref 78.0–100.0)
Neutro Abs: 9 10*3/uL — ABNORMAL HIGH (ref 1.7–7.7)
Platelets: 176 10*3/uL (ref 150–400)
RDW: 17.3 % — ABNORMAL HIGH (ref 11.5–15.5)
WBC: 10.5 10*3/uL (ref 4.0–10.5)

## 2013-03-29 LAB — POTASSIUM: Potassium: 3.7 mEq/L (ref 3.5–5.1)

## 2013-03-29 MED ORDER — FUROSEMIDE 10 MG/ML IJ SOLN
20.0000 mg | Freq: Every day | INTRAMUSCULAR | Status: DC
  Administered 2013-03-29 – 2013-03-30 (×2): 20 mg via INTRAVENOUS
  Filled 2013-03-29 (×3): qty 2

## 2013-03-29 MED ORDER — ENSURE PUDDING PO PUDG
1.0000 | Freq: Three times a day (TID) | ORAL | Status: DC
  Administered 2013-03-29 – 2013-04-02 (×11): 1 via ORAL
  Filled 2013-03-29 (×15): qty 1

## 2013-03-29 NOTE — Progress Notes (Signed)
Patient ID: [name], female   DOB: 1924/10/22, 79 y.o.   MRN: [id number] [facility] PROGRESS NOTE  [name] YQM:[id number] DOB: 05/11/1925 DOA: 03/22/2013 PCP: Nelwyn Salisbury, MD  Brief narrative:  79 year old female with past medical history of ulcerative colitis, CMV colitis, hypertension, hypothyroidism, anxiety who presented to [facility] ED 03/22/2013 with complaints of intractable nausea and vomiting associated with poor oral intake over past 1 week prior to this admission. Patient was subsequently found to have incarcerated hernia adn loop of small bowel within the hernia based on CT abdomen. Please note that the reason why patient required SDU admission is because initial vital signs in ED were BP 70/35, HR 39-144 and T max 98.3 F. Oxygen saturation was 84% on room air. BP has improved with initial IV fluids to 143/80 (after 1.5 L NS) but then bounced back to 88/51.Patient however did not require pressor support. Hospital course is complicated with additional problem of A fib with RVR and NSTEMI. Patient has converted to NSR with digoxin and amiodarone but now requires amiodarone only per cardiology. In addition, pt underwent laparoscopic ventral hernia repair 03/25/2013.   Assessment/Plan:  Principal Problem:  N&V (nausea and vomiting) in the setting of incarcerated hernia and small bowel within the hernia  - likely related to loop of small bowel within the incarcerated hernia seen on CT abdomen  - status post laparoscopic ventral hernia repair 03/25/2013 and no subsequent complications  - appreciate surgery following and their recommendations  Active Problems:  Atrial fibrillation with RVR  - appreciate cardiology following  - pt was started on amiodarone and digoxin per cardio recommendations. Pt converted to NSR and now only on amiodarone for past 48 hours  - heart rate regular, 63 - 76  - pt also started on low dose beta blocker coreg 3.125 mg PO BID  - no anticoagulation  at this time, only DVT prophylaxis per cardio  NSTEMI (non-ST elevated myocardial infarction)  - cardiac enzymes trended down, 1.23 --> 0.62  - 2 D ECHO on this admission - EF 30% to 35%; severe hypokinesis of the mid-distalanteroseptal, anterolateral and inferior myocardium; grade 1 diastolic dysfunction  - cardio is following  - now on coreg 3.125 mg PO BID, amiodarone  Systolic CHF  - in the setting of NSTEMI  Diarrhea - C. Diff by PCR pending - fexiseal out today  Acute renal failure  - likely due to GI losses, dehydration, pre-renal etiology  - renal function WNL  - follow up BMP this am  Hypokalemia  - repleted with IV potassium daily (on 11/2 6 IV K runs and on 11/3 with 3 IV K runs)  - follow up BMP today  - Patient's potassium goal> 4 meq/L  - Recheck magnesium level. Magnesium goal> 2 mg/dL  Anemia of chronic disease  - secondary to history of thyroid disease  - hemoglobin 10.4 --> 9.7 --> 8.8 --> 7.9, - check CBC today, transfuse if hemoglobin less than 7  Acute hypoxic respiratory failure  - unclear etiology  - CXR did not reveal acute cardiopulmonary porcess  - resp status stable; O2 saturation 100% on 2 L nasal canula  HYPOTHYROIDISM  - continue synthroid PO ANXIETY  - continue Xanax Incarcerated incisional hernia  - surgery is following  Moderate protein-calorie malnutrition  - may improve once pt resumes oral intake   Code Status: full code  Family Communication: no family at the bedside  Disposition Plan: tx to telemetry bed   Consultants:  Cardiology  Surgery Procedures:  NG tube placement 03/23/2013 Antibiotics:  Flagyl 03/25/2013 -->  Cefazolin 03/25/2013 -->    HPI/Subjective: No events overnight.   Objective: Filed Vitals:   03/29/13 0400 03/29/13 0500 03/29/13 0600 03/29/13 0640  BP: 123/47  115/54   Pulse: 65 57 59   Temp: 98 F (36.7 C)     TempSrc:      Resp: 20 16 16    Height:      Weight:    89.5 kg (197 lb 5 oz)  SpO2: 91%  100% 100%     Intake/Output Summary (Last 24 hours) at 03/29/13 0800 Last data filed at 03/29/13 0700  Gross per 24 hour  Intake 867.42 ml  Output    525 ml  Net 342.42 ml    Exam:   General:  Pt is alert, follows commands appropriately, not in acute distress  Cardiovascular: Regular rate and rhythm, S1/S2, no murmurs, no rubs, no gallops  Respiratory: Clear to auscultation bilaterally, no wheezing, diminished breath sounds at bases   Abdomen: Soft, non tender, non distended, bowel sounds present, no guarding  Extremities: No edema, pulses DP and PT palpable bilaterally  Neuro: Grossly nonfocal  Data Reviewed: Basic Metabolic Panel:  Recent Labs Lab 03/24/13 0452  03/26/13 0900 03/27/13 0800 03/27/13 0910 03/27/13 1715 03/27/13 1830 03/28/13 0240 03/28/13 0935 03/28/13 2359  NA 140  < > 142 143 144  --   --  143 143  --   K 2.9*  < > 3.5 2.9* 2.9* 2.4* 3.0* 3.5 3.3* 3.7  CL 111  < > 112 116* 118*  --   --  118* 115*  --   CO2 18*  < > 15* 15* 16*  --   --  13* 14*  --   GLUCOSE 112*  < > 112* 115* 116*  --   --  89 97  --   BUN 12  < > 12 14 14   --   --  13 13  --   CREATININE 0.95  < > 0.90 0.95 0.84  --   --  0.82 0.88  --   CALCIUM 8.4  < > 7.8* 8.1* 7.7*  --   --  7.8* 8.4  --   MG 1.3*  --   --   --  1.3*  --   --  1.3* 1.7 1.5  PHOS 3.0  --   --   --   --   --   --   --   --   --   < > = values in this interval not displayed. Liver Function Tests:  Recent Labs Lab 03/22/13 1450 03/24/13 0452 03/27/13 0910 03/28/13 0935  AST 19 11 17 13   ALT 9 7 6  <5  ALKPHOS 65 51 49 52  BILITOT 0.8 0.4 0.2* 0.2*  PROT 5.8* 4.5* 3.8* 4.1*  ALBUMIN 2.8* 2.2* 1.5* 1.7*    Recent Labs Lab 03/22/13 1450  LIPASE 15   CBC:  Recent Labs Lab 03/22/13 1450  03/27/13 0120 03/27/13 1241 03/28/13 0240 03/28/13 0935 03/29/13 0400  WBC 2.8*  < > 12.9* 16.1* 13.4* 16.4* 10.5  NEUTROABS 0.8*  --   --  14.2* 11.8* 14.8* 9.0*  HGB 10.4*  < > 8.1* 8.8* 7.9* 8.6*  7.9*  HCT 30.5*  < > 24.4* 26.5* 23.8* 26.4* 23.7*  MCV 89.2  < > 91.7 91.7 91.2 92.0 91.2  PLT 257  < > 176 196 158 170  176  < > = values in this interval not displayed. Cardiac Enzymes:  Recent Labs Lab 03/22/13 1450 03/22/13 1909 03/23/13 0245 03/23/13 0750 03/23/13 1350  TROPONINI 0.87* 1.87* 1.23* 0.84* 0.62*   CBG:  Recent Labs Lab 03/25/13 1220  GLUCAP 72    Recent Results (from the past 240 hour(s))  MRSA PCR SCREENING     Status: None   Collection Time    03/22/13  8:51 PM      Result Value Range Status   MRSA by PCR NEGATIVE  NEGATIVE Final   Comment:            The GeneXpert MRSA Assay (FDA     approved for NASAL specimens     only), is one component of a     comprehensive MRSA colonization     surveillance program. It is not     intended to diagnose MRSA     infection nor to guide or     monitor treatment for     MRSA infections.     Scheduled Meds: . amiodarone  400 mg Oral BID  . antiseptic oral rinse  15 mL Mouth Rinse q12n4p  . aspirin  81 mg Oral Daily  . atorvastatin  20 mg Oral Q supper  . buPROPion  150 mg Oral Daily  . carvedilol  3.125 mg Oral BID WC  . chlorhexidine  15 mL Mouth Rinse BID  . [START ON 04/06/2013] cyanocobalamin  1,000 mcg Intramuscular Q30 days  . enoxaparin (LOVENOX) injection  40 mg Subcutaneous Q24H  . famotidine  20 mg Oral BID  . feeding supplement (RESOURCE BREEZE)  1 Container Oral BID BM  . ferrous sulfate  325 mg Oral TID WC  . levothyroxine  125 mcg Oral QAC breakfast  . lip balm  1 application Topical BID  . multivitamin with minerals  1 tablet Oral Daily  . mycophenolate  1,000 mg Oral BID  . potassium chloride  40 mEq Oral Daily  . psyllium  1 packet Oral BID  . saccharomyces boulardii  250 mg Oral BID  . sodium chloride  3 mL Intravenous Q12H  . vitamin C  500 mg Oral Daily   Continuous Infusions: . sodium chloride 20 mL/hr (03/28/13 0819)     Debbora Presto, MD  [facility] Pager [phone]  If  7PM-7AM, please contact night-coverage [email or web address] Password [email or web address] 03/29/2013, 8:00 AM   LOS: 7 days

## 2013-03-29 NOTE — Care Management Note (Addendum)
    Page 1 of 2   04/02/2013     11:54:32 AM   CARE MANAGEMENT NOTE 04/02/2013  Patient:  [name], [name]   Account Number:  [id number]  Date Initiated:  03/23/2013  Documentation initiated by:  [facility]  Subjective/Objective Assessment:   79 year old female admitted with N/V, a-fib with RVR.     Action/Plan:   From home.   Anticipated DC Date:  04/02/2013   Anticipated DC Plan:  IP REHAB FACILITY      DC Planning Services  CM consult      Choice offered to / List presented to:             Status of service:  Completed, signed off Medicare Important Message given?  NA - LOS <3 / Initial given by admissions (If response is "NO", the following Medicare IM given date fields will be blank) Date Medicare IM given:   Date Additional Medicare IM given:    Discharge Disposition:  IP REHAB FACILITY  Per UR Regulation:  Reviewed for med. necessity/level of care/duration of stay  If discussed at Long Length of Stay Meetings, dates discussed:   04/02/2013    Comments:  04/02/13 Izzy Doubek RN,BSN NCM [phone] CIR CAN ACCEPT TODAY IF MEDICALLY STABLE.SEE CIR COORDIN NOTE.D/C CIR.TRANSPORTATION MANAGED BY NSG VIA [employer].  04/01/13 Zawadi Aplin RN,BSN NCM [phone] NOTED CIR ALSO FOLLOWING.LEFT MESSAGE W/GENIE ABOUT STATUS FOR CIR.WILL AWAIT RESPONSE. POD#7 LAP VENT HERNIA REPAIR,ADVANCING DIET-CLEARS,FLUID OVERLOAD-IV LASIX.D/C PLAN SNF.  03/29/13 Kamori Barbier RN,BSN NCM [phone] TRANSFER FROM SDU.POD#4 LAP VENT HERNIA REPAIR,AFIB RVR,CHF,ARF.CARDIO/SX/NUTRITION FOLLOWING.PT-SNF.  [id number] Earlene Plater, RN, BSN, [place] [phone] Chart Reviewed for discharge and hospital needs. Discharge needs at time of review:  None Review of patient progress due on [id number].

## 2013-03-29 NOTE — Progress Notes (Signed)
Tol full liquids well - no n/v  Less diarrhea  General: Pt awake/alert/oriented x4 in no major acute distress  Eyes: PERRL, normal EOM. Sclera nonicteric  Neuro: CN II-XII intact w/o focal sensory/motor deficits.  Lymph: No head/neck/groin lymphadenopathy  Psych: No delerium/psychosis/paranoia  HENT: Normocephalic, Mucus membranes moist. No thrush  Neck: Supple, No tracheal deviation  Chest: No pain. Good respiratory excursion.  CV: Pulses intact. Regular rhythm  MS: Normal AROM mjr joints. No obvious deformity  Abdomen: Soft, Nondistended. Min tender at incisions only. No incarcerated hernias. Incisions clean Ext: SCDs BLE. No significant edema. No cyanosis  Skin: No petechiae / purpura   Improved  Post obstructive diarrhea expected.  History of chronic diarrhea.  Start with probiotic and fiber regimen.  Pepto-Bismol for breakthrough diarrhea.  Iron supplementation will help constipate her as well D/c foley  GET HER UP!!  Transfer to telemetry Adv diet  Low K - replace - get back on daily PO  Anemia - add iron (constipating as well)

## 2013-03-29 NOTE — Progress Notes (Signed)
Pharmacist Heart Failure Core Measure Documentation  Assessment: [name] has an EF documented as 30-35% on 03/23/2013 by ECHO.  Rationale: Heart failure patients with left ventricular systolic dysfunction (LVSD) and an EF < 40% should be prescribed an angiotensin converting enzyme inhibitor (ACEI) or angiotensin receptor blocker (ARB) at discharge unless a contraindication is documented in the medical record.  This patient is not currently on an ACEI or ARB for HF.  This note is being placed in the record in order to provide documentation that a contraindication to the use of these agents is present for this encounter.  ACE Inhibitor or Angiotensin Receptor Blocker is contraindicated (specify all that apply)  []   ACEI allergy AND ARB allergy []   Angioedema []   Moderate or severe aortic stenosis []   Hyperkalemia [x]   Hypotension - holding irbesartan at this time d/t low BP []   Renal artery stenosis []   Worsening renal function, preexisting renal disease or dysfunction   Dannielle Huh 03/29/2013 8:22 AM

## 2013-03-29 NOTE — Progress Notes (Signed)
Physical Therapy Treatment Patient Details Name: [name] MRN: [id number] DOB: 07/25/1924 Today's Date: 03/29/2013 Time: 8657-8469 PT Time Calculation (min): 12 min  PT Assessment / Plan / Recommendation  History of Present Illness Pt is an 79 year old female with past medical history of ulcerative colitis, CMV colitis, hypertension, hypothyroidism, anxiety who presented to [facility] ED 03/22/2013 with complaints of intractable nausea and vomiting associated with poor oral intake over past 1 week prior to this admission. Patient was subsequently found to have incarcerated hernia adn loop of small bowel within the hernia based on CT abdomen. Please note that the reason why patient required SDU admission is because initial vital signs in ED were BP 70/35, HR 39-144 and T max 98.3 F. Oxygen saturation was 84% on room air. BP has improved with initial IV fluids to 143/80 (after 1.5 L NS) but then bounced back to 88/51.Patient however did not require pressor support.  Hospital course is complicated with additional problem of A fib with RVR and NSTEMI. Patient has converted to NSR with digoxin and amiodarone but now requires amiodarone only per cardiology. In addition, pt underwent laparoscopic ventral hernia repair 03/25/2013.   PT Comments   Pt limited by fatigue and wooziness after stand pivot transfers this morning with OT, OT reports pt orthostatic upon standing. Therefore, pt did not wish to try standing/ambulation this afternoon, pt willing to perform LE strengthening exercises in supine and PT encouraged pt to sit up in chair later this evening or tomorrow.  Pt would continue to benefit from skilled PT to improve functional mobility and safety.  Follow Up Recommendations  SNF     Does the patient have the potential to tolerate intense rehabilitation     Barriers to Discharge        Equipment Recommendations  None recommended by PT    Recommendations for Other Services    Frequency Min  3X/week   Progress towards PT Goals Progress towards PT goals: Progressing toward goals  Plan Current plan remains appropriate    Precautions / Restrictions Precautions Precautions: Fall Precaution Comments: monitor BP, pt orthostatic on 03/29/13 Restrictions Weight Bearing Restrictions: No   Pertinent Vitals/Pain No c/o pain during session.    Mobility  Bed Mobility Bed Mobility: Not assessed Details for Bed Mobility Assistance: pt too fatigued and feeling woozy after transfer to chair and bed and to 4th floor this afternoon. pt orthostatic upon standing this morning with OT. pt agreeable to performing LE exercises in supine. Transfers Transfers: Not assessed   Exercises General Exercises - Lower Extremity Ankle Circles/Pumps: AROM;20 reps;Supine;Both (rest breaks required between each exercise due to fatigue.) Quad Sets: AROM;20 reps;Supine;Both Heel Slides: 20 reps;AROM;Supine;Both Hip ABduction/ADduction: AROM;20 reps;Supine;Both Other Exercises Other Exercises: performed 1 set of 10, AAROM R shoulder; AROM L shoulder flexion   PT Diagnosis:    PT Problem List:   PT Treatment Interventions:     PT Goals (current goals can now be found in the care plan section)    Visit Information  Last PT Received On: 03/29/13 Assistance Needed: +2 History of Present Illness: Pt is an 79 year old female with past medical history of ulcerative colitis, CMV colitis, hypertension, hypothyroidism, anxiety who presented to [facility] [place] ED 03/22/2013 with complaints of intractable nausea and vomiting associated with poor oral intake over past 1 week prior to this admission. Patient was subsequently found to have incarcerated hernia adn loop of small bowel within the hernia based on CT abdomen. Please note that the  reason why patient required SDU admission is because initial vital signs in ED were BP 70/35, HR 39-144 and T max 98.3 F. Oxygen saturation was 84% on room air. BP has improved with initial IV  fluids to 143/80 (after 1.5 L NS) but then bounced back to 88/51.Patient however did not require pressor support.  Hospital course is complicated with additional problem of A fib with RVR and NSTEMI. Patient has converted to NSR with digoxin and amiodarone but now requires amiodarone only per cardiology. In addition, pt underwent laparoscopic ventral hernia repair 03/25/2013.    Subjective Data      Cognition  Cognition Arousal/Alertness: Awake/alert Behavior During Therapy: WFL for tasks assessed/performed Overall Cognitive Status: Within Functional Limits for tasks assessed    Balance     End of Session PT - End of Session Activity Tolerance: Patient limited by fatigue Patient left: with call bell/phone within reach;in bed   GP     Sol Blazing 03/29/2013, 2:30 PM

## 2013-03-29 NOTE — Progress Notes (Signed)
    Subjective:  Feeling pretty well. No chest pain or shortness of breath  Objective:  Vital Signs in the last 24 hours: Temp:  [98 F (36.7 C)-98.2 F (36.8 C)] 98 F (36.7 C) (11/07 0400) Pulse Rate:  [54-76] 63 (11/07 0818) Resp:  [11-20] 16 (11/07 0600) BP: (85-140)/(24-76) 131/54 mmHg (11/07 0818) SpO2:  [91 %-100 %] 100 % (11/07 0600) Weight:  [197 lb 5 oz (89.5 kg)] 197 lb 5 oz (89.5 kg) (11/07 0640)  Intake/Output from previous day: 11/06 0701 - 11/07 0700 In: 1292.4 [P.O.:820; I.V.:472.4] Out: 525 [Urine:525]  Physical Exam: Pt is alert and oriented, elderly woman in NAD HEENT: normal Neck: JVP - mildly increased Lungs: CTA bilaterally CV: RRR without murmur or gallop Abd: soft, NT, Positive BS, no hepatomegaly Ext: mild diffuse edema, distal pulses intact and equal Skin: warm/dry no rash   Lab Results:  Recent Labs  03/28/13 0935 03/29/13 0400  WBC 16.4* 10.5  HGB 8.6* 7.9*  PLT 170 176    Recent Labs  03/28/13 0240 03/28/13 0935 03/28/13 2359  NA 143 143  --   K 3.5 3.3* 3.7  CL 118* 115*  --   CO2 13* 14*  --   GLUCOSE 89 97  --   BUN 13 13  --   CREATININE 0.82 0.88  --    No results found for this basename: TROPONINI, CK, MB,  in the last 72 hours  Tele: Sinus rhythm, personally reviewed  Assessment/Plan:  1. Atrial fib, stable in sinus rhythm on oral amiodarone 2. Cardiomyopathy - suspect stress-induced based on periapical pattern 3. NSTEMI - conservative Rx recommended considering advanced age and comorbid medical conditions 4. Volume overload, post-op  Would start lasix to mobilize fluid. Will have to watch electrolytes and renal function closely. Overall she is making good progress.   Continue amiodarone 400 mg BID x 2 weeks then 200 mg daily.  Start lasix 20 mg IV daily  Would treat with ASA alone as AF occurred during acute illness and she is maintaining sinus rhythm  Would try to limit amiodarone to 2-3 months  duration  Continue carvedilol  Repeat echo about 6 weeks to reassess LV function  Avoid ACE as she has had episodic hypotension (SBP 85 last night)   Tonny Bollman, M.D. 03/29/2013, 8:24 AM

## 2013-03-29 NOTE — Progress Notes (Signed)
NUTRITION FOLLOW UP  Intervention:   Continue Ensure Pudding TID Continue Multivitamin with minerals daily Add Ensure Complete once daily when diarrhea resolves RD to continue to monitor nutrition care plan  Nutrition Dx:   Inadequate oral intake related to altered GI function as evidenced by NPO status; ongoing- diet advanced to dysphagia 3, inadequate intake continues  Goal:   Pt to meet >/= 90% of their estimated nutrition needs; not met  Monitor:   Diet advancement; advanced to dysphagia 3 11/7 Labs;  albumin low, low hemoglobin, low GFR Weight; up 31 lbs from admission  (net I/O since admission +18,812 ml)  Assessment:   Diet advanced to clear liquids 11/5, full liquids 11/6, and dysphagia 3 11/7). Pt still complaining of diarrhea. Appetite is still decreased. Pt reports liking the Ensure Pudding supplements (best supplement now due to diarrhea). Encouraged continued intake of pudding; will add Ensure Complete after diarrhea resolves.  Per multidisciplinary rounds, pt has skin breakdown on buttocks; c.diff was negative.   Height: Ht Readings from Last 1 Encounters:  03/29/13 5\' 4"  (1.626 m)    Weight Status:   Wt Readings from Last 1 Encounters:  03/29/13 197 lb 5 oz (89.5 kg)    Re-estimated needs:  Kcal: 1600-1800  Protein: 90-100 gms  Fluid: 1.95 L daily  Skin: stage 2 pressure ulcer on right buttocks; two abdominal incisions; +2 generalized edema, + 1 RUE and LUE edema, and +3 RLE and LLE edema  Diet Order: Dysphagia   Intake/Output Summary (Last 24 hours) at 03/29/13 1605 Last data filed at 03/29/13 1100  Gross per 24 hour  Intake    745 ml  Output    525 ml  Net    220 ml    Last BM: 11/7, diarrhea   Labs:   Recent Labs Lab 03/24/13 0452  03/27/13 0910  03/28/13 0240 03/28/13 0935 03/28/13 2359  NA 140  < > 144  --  143 143  --   K 2.9*  < > 2.9*  < > 3.5 3.3* 3.7  CL 111  < > 118*  --  118* 115*  --   CO2 18*  < > 16*  --  13* 14*  --    BUN 12  < > 14  --  13 13  --   CREATININE 0.95  < > 0.84  --  0.82 0.88  --   CALCIUM 8.4  < > 7.7*  --  7.8* 8.4  --   MG 1.3*  --  1.3*  --  1.3* 1.7 1.5  PHOS 3.0  --   --   --   --   --   --   GLUCOSE 112*  < > 116*  --  89 97  --   < > = values in this interval not displayed.  CBG (last 3)  No results found for this basename: GLUCAP,  in the last 72 hours  Scheduled Meds: . amiodarone  400 mg Oral BID  . antiseptic oral rinse  15 mL Mouth Rinse q12n4p  . aspirin  81 mg Oral Daily  . atorvastatin  20 mg Oral Q supper  . buPROPion  150 mg Oral Daily  . carvedilol  3.125 mg Oral BID WC  . chlorhexidine  15 mL Mouth Rinse BID  . [START ON 04/06/2013] cyanocobalamin  1,000 mcg Intramuscular Q30 days  . enoxaparin (LOVENOX) injection  40 mg Subcutaneous Q24H  . famotidine  20 mg Oral BID  .  feeding supplement (ENSURE)  1 Container Oral TID BM  . ferrous sulfate  325 mg Oral TID WC  . furosemide  20 mg Intravenous Daily  . levothyroxine  125 mcg Oral QAC breakfast  . lip balm  1 application Topical BID  . multivitamin with minerals  1 tablet Oral Daily  . mycophenolate  1,000 mg Oral BID  . potassium chloride  40 mEq Oral Daily  . psyllium  1 packet Oral BID  . saccharomyces boulardii  250 mg Oral BID  . sodium chloride  3 mL Intravenous Q12H  . vitamin C  500 mg Oral Daily    Continuous Infusions: . sodium chloride 20 mL/hr (03/28/13 0819)    Ian Malkin RD, LDN Inpatient Clinical Dietitian Pager: [phone] After Hours Pager: [phone]

## 2013-03-29 NOTE — Progress Notes (Signed)
I have reviewed this note and agree with all findings. Kati Sybilla Malhotra, PT, DPT Pager: [phone]   

## 2013-03-29 NOTE — Progress Notes (Signed)
4 Days Post-Op  Subjective: Pt feels much better today.  No N/V.  C/o diarrhea.  Abdominal pain minimal.  Ambulating OOB.  Foley d/c'ed yesterday.  Still has On Q pump even though ordered d/c yesterday.  C/o loose stools.    Objective: Vital signs in last 24 hours: Temp:  [97.9 F (36.6 C)-98.2 F (36.8 C)] 98 F (36.7 C) (11/07 0400) Pulse Rate:  [54-76] 59 (11/07 0600) Resp:  [11-20] 16 (11/07 0600) BP: (85-140)/(24-76) 115/54 mmHg (11/07 0600) SpO2:  [91 %-100 %] 100 % (11/07 0600) Weight:  [197 lb 5 oz (89.5 kg)] 197 lb 5 oz (89.5 kg) (11/07 0640) Last BM Date: 03/28/13  Intake/Output from previous day: 11/06 0701 - 11/07 0700 In: 1232.4 [P.O.:820; I.V.:412.4] Out: 525 [Urine:525] Intake/Output this shift:    PE: Gen:  Alert, NAD, pleasant Abd: Soft, mild tenderness, ND, +BS, no HSM, incisions C/D/I, on Q pump in place   Lab Results:   Recent Labs  03/28/13 0935 03/29/13 0400  WBC 16.4* 10.5  HGB 8.6* 7.9*  HCT 26.4* 23.7*  PLT 170 176   BMET  Recent Labs  03/28/13 0240 03/28/13 0935 03/28/13 2359  NA 143 143  --   K 3.5 3.3* 3.7  CL 118* 115*  --   CO2 13* 14*  --   GLUCOSE 89 97  --   BUN 13 13  --   CREATININE 0.82 0.88  --   CALCIUM 7.8* 8.4  --    PT/INR No results found for this basename: LABPROT, INR,  in the last 72 hours CMP     Component Value Date/Time   NA 143 03/28/2013 0935   K 3.7 03/28/2013 2359   CL 115* 03/28/2013 0935   CO2 14* 03/28/2013 0935   GLUCOSE 97 03/28/2013 0935   BUN 13 03/28/2013 0935   CREATININE 0.88 03/28/2013 0935   CALCIUM 8.4 03/28/2013 0935   PROT 4.1* 03/28/2013 0935   ALBUMIN 1.7* 03/28/2013 0935   AST 13 03/28/2013 0935   ALT <5 03/28/2013 0935   ALKPHOS 52 03/28/2013 0935   BILITOT 0.2* 03/28/2013 0935   GFRNONAA 58* 03/28/2013 0935   GFRAA 67* 03/28/2013 0935   Lipase     Component Value Date/Time   LIPASE 15 03/22/2013 1450       Studies/Results: Dg Abd Portable 1v  03/27/2013   CLINICAL DATA:   Diarrhea, abdominal pain, recent cholecystectomy  EXAM: PORTABLE ABDOMEN - 1 VIEW  COMPARISON:  03/25/2013  FINDINGS: Scattered air and stool throughout the bowel. Negative for obstruction or ileus. Degenerative changes of the spine with mild scoliosis. IVC filter noted at the L3 level. Atherosclerosis of the aorta. Bones are osteopenic. Degenerative changes of the pelvis and hips.  IMPRESSION: Nonspecific mild gaseous distention of the bowel but no obstruction or ileus.   Electronically Signed   By: Ruel Favors M.D.   On: 03/27/2013 20:30    Anti-infectives: Anti-infectives   Start     Dose/Rate Route Frequency Ordered Stop   03/26/13 0600  ceFAZolin (ANCEF) IVPB 2 g/50 mL premix     2 g 100 mL/hr over 30 Minutes Intravenous On call to O.R. 03/25/13 1259 03/25/13 1356   03/26/13 0600  metroNIDAZOLE (FLAGYL) IVPB 500 mg     500 mg 100 mL/hr over 60 Minutes Intravenous On call to O.R. 03/25/13 1259 03/25/13 1412   03/25/13 2200  ceFAZolin (ANCEF) IVPB 2 g/50 mL premix     2 g 100 mL/hr over  30 Minutes Intravenous 3 times per day 03/25/13 1859 03/28/13 1530   03/25/13 2200  metroNIDAZOLE (FLAGYL) IVPB 500 mg     500 mg 100 mL/hr over 60 Minutes Intravenous 3 times per day 03/25/13 1859 03/28/13 1600   03/25/13 1515  clindamycin (CLEOCIN) 900 mg, gentamicin (GARAMYCIN) 240 mg in sodium chloride 0.9 % 1,000 mL for intraperitoneal lavage      Intraperitoneal To Surgery 03/25/13 1507 03/25/13 1720       Assessment/Plan POD #4 s/p laparoscopic ventral hernia repair, laparoscopic chole with IOC, insertion of mesh, LOA, partial omentectomy  1. Tolerating fulls, advance to D3 diet, may need supplements 2. KVO IVF, pain control, antiemetics  3. Appreciate cardiology's recommendations  4. VTE proph - SCD's, lovenox  5. IS and ambulate OOB - PT/OT working with pt  6. On oral amiodarone okay to transfer to the floor from our perspective 7. D/c On Q pain pump today and place dry dressing 8. Dc  rectal tube  Acute on chronic disease anemia - Hgb dropped to 7.9 from 8.6 two days ago.  Iron added which will help to bulk up stools. AFIB, cardiomyopathy, and NSTEMI being managed by cardiology - appreciate their help  Hypokalemia/Hypomag - being managed by primary team  Leukocytosis - normalized 10.5 today Disp: To SNF when medically/surgically stable for d/c      LOS: 7 days    DORT, [facility] 03/29/2013, 7:24 AM Pager: [phone]

## 2013-03-29 NOTE — Progress Notes (Signed)
Occupational Therapy Treatment Patient Details Name: [name] MRN: [id number] DOB: 1924-12-19 Today's Date: 03/29/2013 Time: 1132-1208 OT Time Calculation (min): 36 min  OT Assessment / Plan / Recommendation  History of present illness Pt is an 79 year old female with past medical history of ulcerative colitis, CMV colitis, hypertension, hypothyroidism, anxiety who presented to [facility] ED 03/22/2013 with complaints of intractable nausea and vomiting associated with poor oral intake over past 1 week prior to this admission. Patient was subsequently found to have incarcerated hernia adn loop of small bowel within the hernia based on CT abdomen. Please note that the reason why patient required SDU admission is because initial vital signs in ED were BP 70/35, HR 39-144 and T max 98.3 F. Oxygen saturation was 84% on room air. BP has improved with initial IV fluids to 143/80 (after 1.5 L NS) but then bounced back to 88/51.Patient however did not require pressor support.  Hospital course is complicated with additional problem of A fib with RVR and NSTEMI. Patient has converted to NSR with digoxin and amiodarone but now requires amiodarone only per cardiology. In addition, pt underwent laparoscopic ventral hernia repair 03/25/2013.   OT comments  Pt motivated, decreased BP during OT session limiting session  Follow Up Recommendations  SNF    Barriers to Discharge       Equipment Recommendations  None recommended by OT    Recommendations for Other Services    Frequency Min 2X/week   Progress towards OT Goals Progress towards OT goals: Progressing toward goals (added goals)  Plan      Precautions / Restrictions Precautions Precautions: Fall Restrictions Weight Bearing Restrictions: No   Pertinent Vitals/Pain No c/o pain.  BP started 113/57; after transfer to recliner: 78/40.  Reclined pt and then 96/54.  RN aware.  HR in 60s throughout    ADL  Toilet Transfer: Performed;+2 Total assistance  (mod A x 1 on way over:  see transfers below) Toilet Transfer: Patient Percentage: 40% Toilet Transfer Method: Stand pivot Toileting - Clothing Manipulation and Hygiene: +2 Total assistance Toileting - Clothing Manipulation and Hygiene: Patient Percentage: 0% Where Assessed - Toileting Clothing Manipulation and Hygiene: Standing Transfers/Ambulation Related to ADLs: pt initially performed spt with mod A to 3:1.  Attempted to stand twice, could not stand erect, for hygiene.  Got help for SPT to chair as pt was much weaker.  BP had dropped 35 points systolic. ADL Comments: pt very motivated; fatiques easily.  Today HgB 7.9.  Decreased BP    OT Diagnosis:    OT Problem List:   OT Treatment Interventions:     OT Goals(current goals can now be found in the care plan section) ADL Goals Additional ADL Goal #1: pt will go from sit to stand with mod A x 2 reps and maintain for 2 minutes with min A with RW for adls  Visit Information      Subjective Data      Prior Functioning       Cognition  Cognition Arousal/Alertness: Awake/alert Behavior During Therapy: WFL for tasks assessed/performed    Mobility  Bed Mobility Rolling Right: 4: Min assist Right Sidelying to Sit: 3: Mod assist Details for Bed Mobility Assistance: cues for technique; assist for le's and trunk Transfers Sit to Stand: 1: +2 Total assist;From chair/3-in-1 (pt 40%; initially mod A x 1 from bed) Details for Transfer Assistance: +2 for transfer to chair due to weakness    Exercises  Other Exercises Other Exercises: performed  1 set of 10, AAROM R shoulder; AROM L shoulder flexion   Balance     End of Session OT - End of Session Activity Tolerance: Patient limited by fatigue Patient left: in chair;with call bell/phone within reach  GO     Ahmaya Ostermiller 03/29/2013, 12:42 PM Marica Otter, OTR/L [phone] 03/29/2013

## 2013-03-30 LAB — CBC WITH DIFFERENTIAL/PLATELET
Basophils Absolute: 0 10*3/uL (ref 0.0–0.1)
Basophils Relative: 0 % (ref 0–1)
Hemoglobin: 7.3 g/dL — ABNORMAL LOW (ref 12.0–15.0)
Lymphs Abs: 0.7 10*3/uL (ref 0.7–4.0)
MCH: 30.9 pg (ref 26.0–34.0)
MCV: 93.2 fL (ref 78.0–100.0)
Monocytes Absolute: 0.5 10*3/uL (ref 0.1–1.0)
Monocytes Relative: 7 % (ref 3–12)
Neutro Abs: 6.4 10*3/uL (ref 1.7–7.7)
Platelets: 124 10*3/uL — ABNORMAL LOW (ref 150–400)
RDW: 17.1 % — ABNORMAL HIGH (ref 11.5–15.5)

## 2013-03-30 LAB — BASIC METABOLIC PANEL
BUN: 11 mg/dL (ref 6–23)
Chloride: 114 mEq/L — ABNORMAL HIGH (ref 96–112)
Creatinine, Ser: 0.74 mg/dL (ref 0.50–1.10)
GFR calc Af Amer: 87 mL/min — ABNORMAL LOW (ref >90)
Glucose, Bld: 134 mg/dL — ABNORMAL HIGH (ref 70–99)
Sodium: 139 mEq/L (ref 135–145)

## 2013-03-30 MED ORDER — POTASSIUM CHLORIDE CRYS ER 20 MEQ PO TBCR
40.0000 meq | EXTENDED_RELEASE_TABLET | Freq: Two times a day (BID) | ORAL | Status: AC
  Administered 2013-03-30 (×2): 40 meq via ORAL
  Filled 2013-03-30 (×2): qty 2

## 2013-03-30 MED ORDER — MAGNESIUM SULFATE 40 MG/ML IJ SOLN
2.0000 g | Freq: Once | INTRAMUSCULAR | Status: AC
  Administered 2013-03-30: 14:00:00 2 g via INTRAVENOUS
  Filled 2013-03-30: qty 50

## 2013-03-30 NOTE — Progress Notes (Signed)
    Subjective:  Feeling well. No chest pain or shortness of breath  . amiodarone  400 mg Oral BID  . antiseptic oral rinse  15 mL Mouth Rinse q12n4p  . aspirin  81 mg Oral Daily  . atorvastatin  20 mg Oral Q supper  . buPROPion  150 mg Oral Daily  . carvedilol  3.125 mg Oral BID WC  . chlorhexidine  15 mL Mouth Rinse BID  . [START ON 04/06/2013] cyanocobalamin  1,000 mcg Intramuscular Q30 days  . enoxaparin (LOVENOX) injection  40 mg Subcutaneous Q24H  . famotidine  20 mg Oral BID  . feeding supplement (ENSURE)  1 Container Oral TID BM  . ferrous sulfate  325 mg Oral TID WC  . furosemide  20 mg Intravenous Daily  . levothyroxine  125 mcg Oral QAC breakfast  . lip balm  1 application Topical BID  . multivitamin with minerals  1 tablet Oral Daily  . mycophenolate  1,000 mg Oral BID  . potassium chloride  40 mEq Oral Daily  . potassium chloride  40 mEq Oral BID  . psyllium  1 packet Oral BID  . saccharomyces boulardii  250 mg Oral BID  . sodium chloride  3 mL Intravenous Q12H  . vitamin C  500 mg Oral Daily   Objective:  Vital Signs in the last 24 hours: Temp:  [97.4 F (36.3 C)-98.2 F (36.8 C)] 98.2 F (36.8 C) (11/08 0459) Pulse Rate:  [60-61] 61 (11/08 0459) Resp:  [0-23] 18 (11/08 0459) BP: (78-115)/(40-64) 115/64 mmHg (11/08 0459) SpO2:  [100 %] 100 % (11/08 0459) Weight:  [192 lb 10.9 oz (87.4 kg)] 192 lb 10.9 oz (87.4 kg) (11/08 0500)  Intake/Output from previous day: 11/07 0701 - 11/08 0700 In: 568 [P.O.:325; I.V.:243] Out: -   Physical Exam: Pt is alert and oriented, elderly woman in NAD HEENT: normal Neck: JVP - mildly increased Lungs: CTA bilaterally CV: RRR without murmur or gallop Abd: soft, NT, Positive BS, no hepatomegaly Ext: mild diffuse edema, distal pulses intact and equal Skin: warm/dry no rash  Lab Results:  Recent Labs  03/29/13 0400 03/30/13 0559  WBC 10.5 7.8  HGB 7.9* 7.3*  PLT 176 124*    Recent Labs  03/28/13 0935  03/28/13 2359 03/30/13 0559  NA 143  --  139  K 3.3* 3.7 2.7*  CL 115*  --  114*  CO2 14*  --  18*  GLUCOSE 97  --  134*  BUN 13  --  11  CREATININE 0.88  --  0.74   No results found for this basename: TROPONINI, CK, MB,  in the last 72 hours  Tele: Sinus rhythm, HR 60-70, personally reviewed   Assessment/Plan:   1. Atrial fib, stable in sinus rhythm on oral amiodarone, Continue amiodarone 400 mg BID x 2 weeks then 200 mg daily, but would try to limit amiodarone to 2-3 months duration, Would treat with ASA alone as AF occurred during acute illness and she is maintaining sinus rhythm 2. Cardiomyopathy - suspect stress-induced based on periapical pattern, Continue carvedilol, Avoid ACE as she has had episodic hypotension (SBP 85 last night), Repeat echo about 6 weeks to reassess LV function 3. NSTEMI - conservative Rx recommended considering advanced age and comorbid medical conditions 4. Volume overload, post-op, on iv Lasix 20 mg daily, started on 03/29/13 5. Hypokalemia - will replace   Lars Masson, M.D. 03/30/2013, 8:50 AM

## 2013-03-30 NOTE — Progress Notes (Signed)
Patient ID: [name], female   DOB: [birth year], 79 y.o.   MRN: [id number] [facility] PROGRESS NOTE  Hansini Clodfelter YQM:[id number] DOB: January 28, 1925 DOA: 03/22/2013 PCP: Nelwyn Salisbury, MD  Brief narrative:  79 year old female with past medical history of ulcerative colitis, CMV colitis, hypertension, hypothyroidism, anxiety who presented to [facility] ED 03/22/2013 with complaints of intractable nausea and vomiting associated with poor oral intake over past 1 week prior to this admission. Patient was subsequently found to have incarcerated hernia adn loop of small bowel within the hernia based on CT abdomen. Please note that the reason why patient required SDU admission is because initial vital signs in ED were BP 70/35, HR 39-144 and T max 98.3 F. Oxygen saturation was 84% on room air. BP has improved with initial IV fluids to 143/80 (after 1.5 L NS) but then bounced back to 88/51.Patient however did not require pressor support. Hospital course is complicated with additional problem of A fib with RVR and NSTEMI. Patient has converted to NSR with digoxin and amiodarone but now requires amiodarone only per cardiology. In addition, pt underwent laparoscopic ventral hernia repair 03/25/2013.   Assessment/Plan:  Principal Problem:  N&V (nausea and vomiting) in the setting of incarcerated hernia and small bowel within the hernia  - likely related to loop of small bowel within the incarcerated hernia seen on CT abdomen  - status post laparoscopic ventral hernia repair 03/25/2013 and no subsequent complications post op day #5 - appreciate surgery following and their recommendations  - continue to advance diet as tolerated  Active Problems:  Atrial fibrillation with RVR  - appreciate cardiology following  - pt was started on amiodarone and digoxin per cardio recommendations. Pt converted to NSR and now only on amiodarone  - pt also started on low dose beta blocker coreg 3.125 mg PO BID  - no  anticoagulation at this time, only DVT prophylaxis per cardio  NSTEMI (non-ST elevated myocardial infarction)  - cardiac enzymes trended down, 1.23 --> 0.62  - 2 D ECHO on this admission - EF 30% to 35%; grade 1 diastolic dysfunction  - now on coreg 3.125 mg PO BID, amiodarone  Systolic CHF  - in the setting of NSTEMI, continue Lasix   Diarrhea  - C. Diff by PCR negative  Acute renal failure  - likely due to GI losses, dehydration, pre-renal etiology  - renal function WNL  - follow up BMP this am  Hypokalemia  - repleted with IV potassium daily (on 11/2 6 IV K runs and on 11/3 with 3 IV K runs)  - Still low this AM, will provide K-due 40 MEQ x 2 doses  - Patient's potassium goal> 4 meq/L  - Magnesium goal> 2 mg/dL and this AM,Mg is 1.5 - will also give 2 gm Mg by IV this AM 11//08 Anemia of chronic disease  - secondary to history of thyroid disease  - hemoglobin 10.4 --> 9.7 --> 8.8 --> 7.9 --> 7.3 11/08 - transfuse if hemoglobin less than 7  Acute hypoxic respiratory failure  - unclear etiology  - CXR did not reveal acute cardiopulmonary porcess  - resp status stable; O2 saturation 100% on 2 L nasal canula  HYPOTHYROIDISM  - continue synthroid PO  ANXIETY  - continue Xanax  Incarcerated incisional hernia  - surgery is following  Moderate protein-calorie malnutrition  - may improve once pt resumes oral intake   Code Status: full code  Family Communication: no family at the bedside  Disposition  Plan: remains inpatient   Consultants:  Cardiology  Surgery Procedures:  NG tube placement 03/23/2013 Antibiotics:  Flagyl 03/25/2013 -->  Cefazolin 03/25/2013 -->  HPI/Subjective: No events overnight.   Objective: Filed Vitals:   03/29/13 1334 03/29/13 2059 03/30/13 0459 03/30/13 0500  BP: 107/47 105/55 115/64   Pulse: 61 60 61   Temp: 97.4 F (36.3 C) 98 F (36.7 C) 98.2 F (36.8 C)   TempSrc: Oral Oral Oral   Resp: 18 16 18    Height: 5\' 4"  (1.626 m)     Weight:     87.4 kg (192 lb 10.9 oz)  SpO2: 100% 100% 100%     Intake/Output Summary (Last 24 hours) at 03/30/13 1221 Last data filed at 03/29/13 2300  Gross per 24 hour  Intake    163 ml  Output      0 ml  Net    163 ml    Exam:   General:  Pt is alert, follows commands appropriately, not in acute distress  Cardiovascular: Regular rate and rhythm, S1/S2, no murmurs, no rubs, no gallops  Respiratory: Clear to auscultation bilaterally, no wheezing, no crackles, no rhonchi  Abdomen: Soft, non tender, non distended, bowel sounds present, no guarding  Extremities: mild upper and lower extremity bilateral edema, pulses DP and PT palpable bilaterally  Neuro: Grossly nonfocal  Data Reviewed: Basic Metabolic Panel:  Recent Labs Lab 03/24/13 0452  03/27/13 0800 03/27/13 0910  03/27/13 1830 03/28/13 0240 03/28/13 0935 03/28/13 2359 03/30/13 0559  NA 140  < > 143 144  --   --  143 143  --  139  K 2.9*  < > 2.9* 2.9*  < > 3.0* 3.5 3.3* 3.7 2.7*  CL 111  < > 116* 118*  --   --  118* 115*  --  114*  CO2 18*  < > 15* 16*  --   --  13* 14*  --  18*  GLUCOSE 112*  < > 115* 116*  --   --  89 97  --  134*  BUN 12  < > 14 14  --   --  13 13  --  11  CREATININE 0.95  < > 0.95 0.84  --   --  0.82 0.88  --  0.74  CALCIUM 8.4  < > 8.1* 7.7*  --   --  7.8* 8.4  --  7.5*  MG 1.3*  --   --  1.3*  --   --  1.3* 1.7 1.5  --   PHOS 3.0  --   --   --   --   --   --   --   --   --   < > = values in this interval not displayed. Liver Function Tests:  Recent Labs Lab 03/24/13 0452 03/27/13 0910 03/28/13 0935  AST 11 17 13   ALT 7 6 <5  ALKPHOS 51 49 52  BILITOT 0.4 0.2* 0.2*  PROT 4.5* 3.8* 4.1*  ALBUMIN 2.2* 1.5* 1.7*   CBC:  Recent Labs Lab 03/27/13 1241 03/28/13 0240 03/28/13 0935 03/29/13 0400 03/30/13 0559  WBC 16.1* 13.4* 16.4* 10.5 7.8  NEUTROABS 14.2* 11.8* 14.8* 9.0* 6.4  HGB 8.8* 7.9* 8.6* 7.9* 7.3*  HCT 26.5* 23.8* 26.4* 23.7* 22.0*  MCV 91.7 91.2 92.0 91.2 93.2  PLT 196  158 170 176 124*   Cardiac Enzymes:  Recent Labs Lab 03/23/13 1350  TROPONINI 0.62*   CBG:  Recent Labs Lab 03/25/13  1220  GLUCAP 72    Recent Results (from the past 240 hour(s))  MRSA PCR SCREENING     Status: None   Collection Time    03/22/13  8:51 PM      Result Value Range Status   MRSA by PCR NEGATIVE  NEGATIVE Final   Comment:            The GeneXpert MRSA Assay (FDA     approved for NASAL specimens     only), is one component of a     comprehensive MRSA colonization     surveillance program. It is not     intended to diagnose MRSA     infection nor to guide or     monitor treatment for     MRSA infections.  CLOSTRIDIUM DIFFICILE BY PCR     Status: None   Collection Time    03/29/13  6:40 AM      Result Value Range Status   C difficile by pcr NEGATIVE  NEGATIVE Final   Comment: Performed at [facility]     Scheduled Meds: . amiodarone  400 mg Oral BID  . antiseptic oral rinse  15 mL Mouth Rinse q12n4p  . aspirin  81 mg Oral Daily  . atorvastatin  20 mg Oral Q supper  . buPROPion  150 mg Oral Daily  . carvedilol  3.125 mg Oral BID WC  . chlorhexidine  15 mL Mouth Rinse BID  . [START ON 04/06/2013] cyanocobalamin  1,000 mcg Intramuscular Q30 days  . enoxaparin (LOVENOX) injection  40 mg Subcutaneous Q24H  . famotidine  20 mg Oral BID  . feeding supplement (ENSURE)  1 Container Oral TID BM  . ferrous sulfate  325 mg Oral TID WC  . furosemide  20 mg Intravenous Daily  . levothyroxine  125 mcg Oral QAC breakfast  . lip balm  1 application Topical BID  . multivitamin with minerals  1 tablet Oral Daily  . mycophenolate  1,000 mg Oral BID  . potassium chloride  40 mEq Oral Daily  . potassium chloride  40 mEq Oral BID  . psyllium  1 packet Oral BID  . saccharomyces boulardii  250 mg Oral BID  . sodium chloride  3 mL Intravenous Q12H  . vitamin C  500 mg Oral Daily   Continuous Infusions: . sodium chloride 20 mL/hr (03/28/13 0819)      Debbora Presto, MD  [facility] Pager [phone]  If 7PM-7AM, please contact night-coverage [email or web address] Password [email or web address] 03/30/2013, 12:21 PM   LOS: 8 days

## 2013-03-30 NOTE — Progress Notes (Signed)
CRITICAL VALUE ALERT  Critical value received:  K-2.7  Date of notification:  03/30/13  Time of notification:  0725  Critical value read back:yes  Nurse who received alert:  Gabriel Rainwater RN  MD notified (1st page):  Izola Price   Time of first page:  0728  MD notified (2nd page):  Time of second page:  Responding MD: Izola Price   Time MD responded:  [phone]

## 2013-03-30 NOTE — Progress Notes (Signed)
Spoke with Pt's daughter, Carley Hammed, re: d/c plans.  Carley Hammed would like Pt to be considered for CIR, as she was just there in September.  She stated that Pt did well and recovered quickly with the help of CIR.  Carley Hammed stated that she and her sister, who is out-of-town, will review the bed offers and have a decision by Monday.  Notified MD that the family are hopeful for CIR.  Providence Crosby, LCSWA Clinical Social Work [phone]

## 2013-03-30 NOTE — Progress Notes (Signed)
Patient ID: [name], female   DOB: Jun 08, 1924, 79 y.o.   MRN: [id number] [facility] Surgery Progress Note:   5 Days Post-Op  Subjective: Mental status is clear Objective: Vital signs in last 24 hours: Temp:  [97.4 F (36.3 C)-98.2 F (36.8 C)] 98.2 F (36.8 C) (11/08 0459) Pulse Rate:  [60-63] 61 (11/08 0459) Resp:  [0-23] 18 (11/08 0459) BP: (78-131)/(40-64) 115/64 mmHg (11/08 0459) SpO2:  [100 %] 100 % (11/08 0459) Weight:  [192 lb 10.9 oz (87.4 kg)] 192 lb 10.9 oz (87.4 kg) (11/08 0500)  Intake/Output from previous day: 11/07 0701 - 11/08 0700 In: 568 [P.O.:325; I.V.:243] Out: -  Intake/Output this shift:    Physical Exam: Work of breathing is not labored.  Incision bland.  flatus  Lab Results:  Results for orders placed during the hospital encounter of 03/22/13 (from the past 48 hour(s))  COMPREHENSIVE METABOLIC PANEL     Status: Abnormal   Collection Time    03/28/13  9:35 AM      Result Value Range   Sodium 143  135 - 145 mEq/L   Potassium 3.3 (*) 3.5 - 5.1 mEq/L   Chloride 115 (*) 96 - 112 mEq/L   CO2 14 (*) 19 - 32 mEq/L   Glucose, Bld 97  70 - 99 mg/dL   BUN 13  6 - 23 mg/dL   Creatinine, Ser 8.29  0.50 - 1.10 mg/dL   Calcium 8.4  8.4 - 56.2 mg/dL   Total Protein 4.1 (*) 6.0 - 8.3 g/dL   Albumin 1.7 (*) 3.5 - 5.2 g/dL   AST 13  0 - 37 U/L   ALT <5  0 - 35 U/L   Comment: REPEATED TO VERIFY   Alkaline Phosphatase 52  39 - 117 U/L   Total Bilirubin 0.2 (*) 0.3 - 1.2 mg/dL   GFR calc non Af Amer 58 (*) >90 mL/min   GFR calc Af Amer 67 (*) >90 mL/min   Comment: (NOTE)     The eGFR has been calculated using the CKD EPI equation.     This calculation has not been validated in all clinical situations.     eGFR's persistently <90 mL/min signify possible Chronic Kidney     Disease.  CBC WITH DIFFERENTIAL     Status: Abnormal   Collection Time    03/28/13  9:35 AM      Result Value Range   WBC 16.4 (*) 4.0 - 10.5 K/uL   RBC 2.87 (*) 3.87 - 5.11  MIL/uL   Hemoglobin 8.6 (*) 12.0 - 15.0 g/dL   HCT 13.0 (*) 86.5 - 78.4 %   MCV 92.0  78.0 - 100.0 fL   MCH 30.0  26.0 - 34.0 pg   MCHC 32.6  30.0 - 36.0 g/dL   RDW 69.6 (*) 29.5 - 28.4 %   Platelets 170  150 - 400 K/uL   Neutrophils Relative % 90 (*) 43 - 77 %   Lymphocytes Relative 5 (*) 12 - 46 %   Monocytes Relative 5  3 - 12 %   Eosinophils Relative 0  0 - 5 %   Basophils Relative 0  0 - 1 %   Neutro Abs 14.8 (*) 1.7 - 7.7 K/uL   Lymphs Abs 0.8  0.7 - 4.0 K/uL   Monocytes Absolute 0.8  0.1 - 1.0 K/uL   Eosinophils Absolute 0.0  0.0 - 0.7 K/uL   Basophils Absolute 0.0  0.0 - 0.1 K/uL  WBC Morphology TOXIC GRANULATION    MAGNESIUM     Status: None   Collection Time    03/28/13  9:35 AM      Result Value Range   Magnesium 1.7  1.5 - 2.5 mg/dL  POTASSIUM     Status: None   Collection Time    03/28/13 11:59 PM      Result Value Range   Potassium 3.7  3.5 - 5.1 mEq/L  MAGNESIUM     Status: None   Collection Time    03/28/13 11:59 PM      Result Value Range   Magnesium 1.5  1.5 - 2.5 mg/dL  CBC WITH DIFFERENTIAL     Status: Abnormal   Collection Time    03/29/13  4:00 AM      Result Value Range   WBC 10.5  4.0 - 10.5 K/uL   RBC 2.60 (*) 3.87 - 5.11 MIL/uL   Hemoglobin 7.9 (*) 12.0 - 15.0 g/dL   HCT 40.9 (*) 81.1 - 91.4 %   MCV 91.2  78.0 - 100.0 fL   MCH 30.4  26.0 - 34.0 pg   MCHC 33.3  30.0 - 36.0 g/dL   RDW 78.2 (*) 95.6 - 21.3 %   Platelets 176  150 - 400 K/uL   Neutrophils Relative % 86 (*) 43 - 77 %   Neutro Abs 9.0 (*) 1.7 - 7.7 K/uL   Lymphocytes Relative 7 (*) 12 - 46 %   Lymphs Abs 0.7  0.7 - 4.0 K/uL   Monocytes Relative 6  3 - 12 %   Monocytes Absolute 0.7  0.1 - 1.0 K/uL   Eosinophils Relative 1  0 - 5 %   Eosinophils Absolute 0.1  0.0 - 0.7 K/uL   Basophils Relative 0  0 - 1 %   Basophils Absolute 0.0  0.0 - 0.1 K/uL  CLOSTRIDIUM DIFFICILE BY PCR     Status: None   Collection Time    03/29/13  6:40 AM      Result Value Range   C difficile by  pcr NEGATIVE  NEGATIVE   Comment: Performed at [facility]  CBC WITH DIFFERENTIAL     Status: Abnormal (Preliminary result)   Collection Time    03/30/13  5:59 AM      Result Value Range   WBC 7.8  4.0 - 10.5 K/uL   RBC 2.36 (*) 3.87 - 5.11 MIL/uL   Hemoglobin 7.3 (*) 12.0 - 15.0 g/dL   HCT 08.6 (*) 57.8 - 46.9 %   MCV 93.2  78.0 - 100.0 fL   MCH 30.9  26.0 - 34.0 pg   MCHC 33.2  30.0 - 36.0 g/dL   RDW 62.9 (*) 52.8 - 41.3 %   Platelets PENDING  150 - 400 K/uL   Neutrophils Relative % PENDING  43 - 77 %   Neutro Abs PENDING  1.7 - 7.7 K/uL   Band Neutrophils PENDING  0 - 10 %   Lymphocytes Relative PENDING  12 - 46 %   Lymphs Abs PENDING  0.7 - 4.0 K/uL   Monocytes Relative PENDING  3 - 12 %   Monocytes Absolute PENDING  0.1 - 1.0 K/uL   Eosinophils Relative PENDING  0 - 5 %   Eosinophils Absolute PENDING  0.0 - 0.7 K/uL   Basophils Relative PENDING  0 - 1 %   Basophils Absolute PENDING  0.0 - 0.1 K/uL   WBC Morphology PENDING  RBC Morphology PENDING     Smear Review PENDING     nRBC PENDING  0 /100 WBC   Metamyelocytes Relative PENDING     Myelocytes PENDING     Promyelocytes Absolute PENDING     Blasts PENDING    BASIC METABOLIC PANEL     Status: Abnormal   Collection Time    03/30/13  5:59 AM      Result Value Range   Sodium 139  135 - 145 mEq/L   Potassium 2.7 (*) 3.5 - 5.1 mEq/L   Comment: REPEATED TO VERIFY     OSAGIE RN AT 0725 ON [id number] BY DLONG   Chloride 114 (*) 96 - 112 mEq/L   CO2 18 (*) 19 - 32 mEq/L   Glucose, Bld 134 (*) 70 - 99 mg/dL   BUN 11  6 - 23 mg/dL   Creatinine, Ser 1.19  0.50 - 1.10 mg/dL   Calcium 7.5 (*) 8.4 - 10.5 mg/dL   GFR calc non Af Amer 75 (*) >90 mL/min   GFR calc Af Amer 87 (*) >90 mL/min   Comment: (NOTE)     The eGFR has been calculated using the CKD EPI equation.     This calculation has not been validated in all clinical situations.     eGFR's persistently <90 mL/min signify possible Chronic Kidney     Disease.     Radiology/Results: No results found.  Anti-infectives: Anti-infectives   Start     Dose/Rate Route Frequency Ordered Stop   03/26/13 0600  ceFAZolin (ANCEF) IVPB 2 g/50 mL premix     2 g 100 mL/hr over 30 Minutes Intravenous On call to O.R. 03/25/13 1259 03/25/13 1356   03/26/13 0600  metroNIDAZOLE (FLAGYL) IVPB 500 mg     500 mg 100 mL/hr over 60 Minutes Intravenous On call to O.R. 03/25/13 1259 03/25/13 1412   03/25/13 2200  ceFAZolin (ANCEF) IVPB 2 g/50 mL premix     2 g 100 mL/hr over 30 Minutes Intravenous 3 times per day 03/25/13 1859 03/28/13 1530   03/25/13 2200  metroNIDAZOLE (FLAGYL) IVPB 500 mg     500 mg 100 mL/hr over 60 Minutes Intravenous 3 times per day 03/25/13 1859 03/28/13 1600   03/25/13 1515  clindamycin (CLEOCIN) 900 mg, gentamicin (GARAMYCIN) 240 mg in sodium chloride 0.9 % 1,000 mL for intraperitoneal lavage      Intraperitoneal To Surgery 03/25/13 1507 03/25/13 1720      Assessment/Plan: Problem List: Patient Active Problem List   Diagnosis Date Noted  . Systolic CHF 03/27/2013  . Pulmonary hypertension 03/27/2013  . Hypokalemia 03/27/2013  . SBO (small bowel obstruction) 03/25/2013  . Ulcerative colitis   . Protein-calorie malnutrition, severe 03/24/2013  . Moderate protein-calorie malnutrition 03/23/2013  . Anemia of chronic disease 03/23/2013  . Acute respiratory failure with hypoxia 03/23/2013  . Atrial fibrillation with RVR 03/22/2013  . N&V (nausea and vomiting) 03/22/2013  . Porcelain gallbladder 03/22/2013  . Incarcerated incisional hernia 03/22/2013  . NSTEMI (non-ST elevated myocardial infarction) 03/22/2013  . Acute renal failure 12/07/2012  . ANXIETY 12/26/2007  . HYPOTHYROIDISM 11/10/2006    Doing well.  Advance diet as tolerated.   5 Days Post-Op    LOS: 8 days   Matt B. Daphine Deutscher, MD, [facility] [phone] beeper [phone]  03/30/2013 7:52 AM

## 2013-03-31 LAB — CBC WITH DIFFERENTIAL/PLATELET
Basophils Relative: 0 % (ref 0–1)
Eosinophils Absolute: 0.1 10*3/uL (ref 0.0–0.7)
Eosinophils Relative: 1 % (ref 0–5)
Hemoglobin: 7.3 g/dL — ABNORMAL LOW (ref 12.0–15.0)
Lymphocytes Relative: 10 % — ABNORMAL LOW (ref 12–46)
Lymphs Abs: 0.8 10*3/uL (ref 0.7–4.0)
MCH: 30.9 pg (ref 26.0–34.0)
MCV: 93.2 fL (ref 78.0–100.0)
Monocytes Absolute: 0.6 10*3/uL (ref 0.1–1.0)
Neutrophils Relative %: 82 % — ABNORMAL HIGH (ref 43–77)
Platelets: 109 10*3/uL — ABNORMAL LOW (ref 150–400)
RBC: 2.36 MIL/uL — ABNORMAL LOW (ref 3.87–5.11)

## 2013-03-31 LAB — BASIC METABOLIC PANEL
CO2: 18 mEq/L — ABNORMAL LOW (ref 19–32)
Calcium: 7.7 mg/dL — ABNORMAL LOW (ref 8.4–10.5)
GFR calc non Af Amer: 51 mL/min — ABNORMAL LOW (ref >90)
Glucose, Bld: 121 mg/dL — ABNORMAL HIGH (ref 70–99)
Potassium: 3.4 mEq/L — ABNORMAL LOW (ref 3.5–5.1)
Sodium: 137 mEq/L (ref 135–145)

## 2013-03-31 LAB — MAGNESIUM: Magnesium: 1.8 mg/dL (ref 1.5–2.5)

## 2013-03-31 MED ORDER — FUROSEMIDE 10 MG/ML IJ SOLN
20.0000 mg | Freq: Two times a day (BID) | INTRAMUSCULAR | Status: DC
  Administered 2013-03-31 (×2): 20 mg via INTRAVENOUS
  Filled 2013-03-31 (×4): qty 2

## 2013-03-31 MED ORDER — MAGNESIUM SULFATE 50 % IJ SOLN
3.0000 g | Freq: Once | INTRAVENOUS | Status: AC
  Administered 2013-03-31: 3 g via INTRAVENOUS
  Filled 2013-03-31: qty 6

## 2013-03-31 MED ORDER — POTASSIUM CHLORIDE CRYS ER 20 MEQ PO TBCR
40.0000 meq | EXTENDED_RELEASE_TABLET | Freq: Two times a day (BID) | ORAL | Status: DC
  Administered 2013-03-31 – 2013-04-02 (×4): 40 meq via ORAL
  Filled 2013-03-31 (×5): qty 2

## 2013-03-31 MED ORDER — POTASSIUM CHLORIDE CRYS ER 20 MEQ PO TBCR
40.0000 meq | EXTENDED_RELEASE_TABLET | Freq: Once | ORAL | Status: AC
  Administered 2013-03-31: 08:00:00 40 meq via ORAL
  Filled 2013-03-31: qty 2

## 2013-03-31 MED ORDER — POTASSIUM CHLORIDE CRYS ER 20 MEQ PO TBCR: 40.0000 meq | EXTENDED_RELEASE_TABLET | Freq: Two times a day (BID) | ORAL | Status: DC

## 2013-03-31 MED ORDER — POTASSIUM CHLORIDE CRYS ER 20 MEQ PO TBCR
40.0000 meq | EXTENDED_RELEASE_TABLET | Freq: Once | ORAL | Status: AC
  Administered 2013-03-31: 12:00:00 40 meq via ORAL
  Filled 2013-03-31: qty 2

## 2013-03-31 MED ORDER — SODIUM CHLORIDE 0.9 % IJ SOLN
10.0000 mL | INTRAMUSCULAR | Status: DC | PRN
  Administered 2013-03-31 – 2013-04-02 (×4): 10 mL

## 2013-03-31 MED ORDER — ACETAMINOPHEN 325 MG PO TABS: 650.0000 mg | ORAL_TABLET | Freq: Four times a day (QID) | ORAL | Status: DC | PRN

## 2013-03-31 NOTE — Progress Notes (Signed)
Patient ID: [name], female   DOB: 1924/05/26, 79 y.o.   MRN: [id number] [facility] PROGRESS NOTE  Rowan Pollman QIO:[id number] DOB: 20-Nov-1924 DOA: 03/22/2013 PCP: Nelwyn Salisbury, MD  Brief narrative:  79 year old female with past medical history of ulcerative colitis, CMV colitis, hypertension, hypothyroidism, anxiety who presented to [facility] ED 03/22/2013 with complaints of intractable nausea and vomiting associated with poor oral intake over past 1 week prior to this admission. Patient was subsequently found to have incarcerated hernia adn loop of small bowel within the hernia based on CT abdomen. Please note that the reason why patient required SDU admission is because initial vital signs in ED were BP 70/35, HR 39-144 and T max 98.3 F. Oxygen saturation was 84% on room air. BP has improved with initial IV fluids to 143/80 (after 1.5 L NS) but then bounced back to 88/51.Patient however did not require pressor support. Hospital course is complicated with additional problem of A fib with RVR and NSTEMI. Patient has converted to NSR with digoxin and amiodarone but now requires amiodarone only per cardiology. In addition, pt underwent laparoscopic ventral hernia repair 03/25/2013.   Assessment/Plan:  Principal Problem:  N&V (nausea and vomiting) in the setting of incarcerated hernia and small bowel within the hernia  - likely related to loop of small bowel within the incarcerated hernia seen on CT abdomen  - status post laparoscopic ventral hernia repair 03/25/2013 and no subsequent complications post op day #6 - appreciate surgery following and their recommendations  - continue to advance diet as tolerated  Active Problems:  Atrial fibrillation with RVR  - appreciate cardiology following  - pt was started on amiodarone and digoxin per cardio recommendations. Pt converted to NSR and now only on amiodarone  - pt also started on low dose beta blocker coreg 3.125 mg PO BID  - no  anticoagulation at this time, only DVT prophylaxis per cardio  NSTEMI (non-ST elevated myocardial infarction)  - cardiac enzymes trended down, 1.23 --> 0.62  - 2 D ECHO on this admission - EF 30% to 35%; grade 1 diastolic dysfunction  - now on coreg 3.125 mg PO BID, amiodarone  Systolic CHF  - in the setting of NSTEMI, continue Lasix  - encourage PT inpatient, ambulate as pt able to tolerate  Diarrhea  - C. Diff by PCR negative  Acute renal failure  - likely due to GI losses, dehydration, pre-renal etiology  - renal function WNL  Hypokalemia  - repleted with IV potassium daily (on 11/2 6 IV K runs and on 11/3 with 3 IV K runs)  - pt is currently on K-dur 40 MEQ QD - Still slightly low this AM, will provide K-due 40 MEQ additional dose today and will check Mg and BMP  in AM  - Patient's potassium goal> 4 meq/L  - Magnesium goal> 2 mg/dL  Anemia of chronic disease  - secondary to history of thyroid disease  - hemoglobin 10.4 --> 9.7 --> 8.8 --> 7.9 --> 7.3 11/09 - transfuse if hemoglobin less than 7  Acute hypoxic respiratory failure  - unclear etiology  - CXR did not reveal acute cardiopulmonary porcess  - resp status stable; O2 saturation 100% on 2 L nasal canula  HYPOTHYROIDISM  - continue synthroid PO  ANXIETY  - continue Xanax  Incarcerated incisional hernia  - surgery is following  Moderate protein-calorie malnutrition  - may improve once pt resumes oral intake   Code Status: full code  Family Communication: no  family at the bedside  Disposition Plan: remains inpatient and consult for inpatient rehab (CIR) pending   Consultants:  Cardiology  Surgery Procedures:  NG tube placement 03/23/2013 Antibiotics:  Flagyl 03/25/2013 --> 11/06 Cefazolin 03/25/2013 --> 11/06  HPI/Subjective: No events overnight.   Objective: Filed Vitals:   03/30/13 1655 03/30/13 2155 03/31/13 0430 03/31/13 0500  BP: 116/43 116/56 114/53   Pulse: 63 58 65   Temp:  98.4 F (36.9 C) 98.3  F (36.8 C)   TempSrc:  Oral Oral   Resp:  16 18   Height:      Weight:    87.6 kg (193 lb 2 oz)  SpO2:  100% 100%     Intake/Output Summary (Last 24 hours) at 03/31/13 0624 Last data filed at 03/30/13 2300  Gross per 24 hour  Intake    460 ml  Output      0 ml  Net    460 ml    Exam:   General:  Pt is alert, follows commands appropriately, not in acute distress  Cardiovascular: Regular rate and rhythm, S1/S2, no rubs, no gallops  Respiratory: Clear to auscultation bilaterally, no wheezing, diminished breath sounds at bases   Abdomen: Soft, non tender, non distended, bowel sounds present, no guarding  Extremities: upper and lower extremity edema, pulses DP and PT palpable bilaterally  Neuro: Grossly nonfocal  Data Reviewed: Basic Metabolic Panel:  Recent Labs Lab 03/27/13 0910  03/28/13 0240 03/28/13 0935 03/28/13 2359 03/30/13 0559 03/31/13 0400  NA 144  --  143 143  --  139 137  K 2.9*  < > 3.5 3.3* 3.7 2.7* 3.4*  CL 118*  --  118* 115*  --  114* 112  CO2 16*  --  13* 14*  --  18* 18*  GLUCOSE 116*  --  89 97  --  134* 121*  BUN 14  --  13 13  --  11 11  CREATININE 0.84  --  0.82 0.88  --  0.74 0.97  CALCIUM 7.7*  --  7.8* 8.4  --  7.5* 7.7*  MG 1.3*  --  1.3* 1.7 1.5  --   --   < > = values in this interval not displayed. Liver Function Tests:  Recent Labs Lab 03/27/13 0910 03/28/13 0935  AST 17 13  ALT 6 <5  ALKPHOS 49 52  BILITOT 0.2* 0.2*  PROT 3.8* 4.1*  ALBUMIN 1.5* 1.7*    CBC:  Recent Labs Lab 03/28/13 0240 03/28/13 0935 03/29/13 0400 03/30/13 0559 03/31/13 0400  WBC 13.4* 16.4* 10.5 7.8 8.0  NEUTROABS 11.8* 14.8* 9.0* 6.4 6.5  HGB 7.9* 8.6* 7.9* 7.3* 7.3*  HCT 23.8* 26.4* 23.7* 22.0* 22.0*  MCV 91.2 92.0 91.2 93.2 93.2  PLT 158 170 176 124* 109*   CBG:  Recent Labs Lab 03/25/13 1220  GLUCAP 72    Recent Results (from the past 240 hour(s))  MRSA PCR SCREENING     Status: None   Collection Time    03/22/13  8:51 PM       Result Value Range Status   MRSA by PCR NEGATIVE  NEGATIVE Final   Comment:            The GeneXpert MRSA Assay (FDA     approved for NASAL specimens     only), is one component of a     comprehensive MRSA colonization     surveillance program. It is not  intended to diagnose MRSA     infection nor to guide or     monitor treatment for     MRSA infections.  CLOSTRIDIUM DIFFICILE BY PCR     Status: None   Collection Time    03/29/13  6:40 AM      Result Value Range Status   C difficile by pcr NEGATIVE  NEGATIVE Final   Comment: Performed at [facility]     Scheduled Meds: . amiodarone  400 mg Oral BID  . antiseptic oral rinse  15 mL Mouth Rinse q12n4p  . aspirin  81 mg Oral Daily  . atorvastatin  20 mg Oral Q supper  . buPROPion  150 mg Oral Daily  . carvedilol  3.125 mg Oral BID WC  . chlorhexidine  15 mL Mouth Rinse BID  . [START ON 04/06/2013] cyanocobalamin  1,000 mcg Intramuscular Q30 days  . enoxaparin (LOVENOX) injection  40 mg Subcutaneous Q24H  . famotidine  20 mg Oral BID  . feeding supplement (ENSURE)  1 Container Oral TID BM  . ferrous sulfate  325 mg Oral TID WC  . furosemide  20 mg Intravenous Daily  . levothyroxine  125 mcg Oral QAC breakfast  . lip balm  1 application Topical BID  . multivitamin with minerals  1 tablet Oral Daily  . mycophenolate  1,000 mg Oral BID  . potassium chloride  40 mEq Oral Daily  . psyllium  1 packet Oral BID  . saccharomyces boulardii  250 mg Oral BID  . sodium chloride  3 mL Intravenous Q12H  . vitamin C  500 mg Oral Daily   Continuous Infusions: . sodium chloride 20 mL/hr (03/28/13 0819)     Debbora Presto, MD  [facility] Pager [phone]  If 7PM-7AM, please contact night-coverage [email or web address] Password [email or web address] 03/31/2013, 6:24 AM   LOS: 9 days

## 2013-03-31 NOTE — Progress Notes (Signed)
[name] [id number] 28-Aug-1924  CARE TEAM:  PCP: Nelwyn Salisbury, MD  Outpatient Care Team: Patient Care Team: Nelwyn Salisbury, MD as PCP - General  Inpatient Treatment Team: Treatment Team: Attending Provider: Dorothea Ogle, MD; Registered Nurse: Rayburn Ma, RN; Consulting Physician: Bishop Limbo, MD; Registered Nurse: Hewitt Blade, RN; Consulting Physician: Kathleene Hazel, MD; Rounding Team: Jessie Foot, MD; Registered Nurse: Joylene Draft, RN; Registered Nurse: Natasha Mead, RN; Registered Nurse: Elaina Pattee, RN; Technician: Corrinne Eagle, NT; Technician: Delano Metz, NT; Technician: Nelson Chimes, NT; Registered Nurse: Silvestre Gunner, RN   Subjective:  Feeling much better Tolerating full liquids In good spirits Diarrhea less  Objective:  Vital signs:  Filed Vitals:   03/30/13 2155 03/31/13 0430 03/31/13 0500 03/31/13 0838  BP: 116/56 114/53  123/57  Pulse: 58 65  63  Temp: 98.4 F (36.9 C) 98.3 F (36.8 C)    TempSrc: Oral Oral    Resp: 16 18    Height:      Weight:   193 lb 2 oz (87.6 kg)   SpO2: 100% 100%      Last BM Date: 03/31/13  Intake/Output   Yesterday:  11/08 0701 - 11/09 0700 In: 620 [P.O.:240; I.V.:380] Out: -  This shift:     Bowel function:  Flatus: y  BM: y  Drain: n/a  Physical Exam:  General: Pt awake/alert/oriented x4 in no acute distress Eyes: PERRL, normal EOM.  Sclera clear.  No icterus Neuro: CN II-XII intact w/o focal sensory/motor deficits. Lymph: No head/neck/groin lymphadenopathy Psych:  No delerium/psychosis/paranoia HENT: Normocephalic, Mucus membranes moist.  No thrush Neck: Supple, No tracheal deviation Chest: No chest wall pain w good excursion CV:  Pulses intact.  Regular rhythm MS: Normal AROM mjr joints.  No obvious deformity Abdomen: Soft.  Nondistended.  Mildly tender at incisions only.  No evidence of peritonitis.  No incarcerated hernias. Ext:  SCDs  BLE.  No mjr edema.  No cyanosis Skin: No petechiae / purpura   Problem List:   Principal Problem:   NSTEMI (non-ST elevated myocardial infarction) Active Problems:   HYPOTHYROIDISM   ANXIETY   Acute renal failure   Atrial fibrillation with RVR   N&V (nausea and vomiting)   Porcelain gallbladder   Incisional hernia, incarcerated s/p lap repair 03/25/2013   Anemia of chronic disease   Acute respiratory failure with hypoxia   Protein-calorie malnutrition, severe   SBO (small bowel obstruction)   Systolic CHF   Pulmonary hypertension   Hypokalemia   Assessment  [name]  79 y.o. female  6 Days Post-Op  Procedure(s): LAPAROSCOPIC VENTRAL HERNIA REPAIR  LAPAROSCOPIC CHOLECYSTECTOMY WITH INTRAOPERATIVE CHOLANGIOGRAM INSERTION OF MESH LAPAROSCOPIC LYSIS OF ADHESIONS PARTIAL OMENTECTOMY  Recovering well  Plan:  Solid diet Amiodarone for Afib/RVR Anticoagulate as needed Correct low K VTE prophylaxis- SCDs, etc mobilize as tolerated to help recovery - PT/OT/Rehab   Ardeth Sportsman, M.D., F.A.C.S. Gastrointestinal and Minimally Invasive Surgery [facility] [address], [address] [place], [place] [address] [phone] Main / Paging   03/31/2013   Results:   Labs: Results for orders placed during the hospital encounter of 03/22/13 (from the past 48 hour(s))  CBC WITH DIFFERENTIAL     Status: Abnormal   Collection Time    03/30/13  5:59 AM      Result Value Range   WBC 7.8  4.0 - 10.5 K/uL   RBC 2.36 (*)  3.87 - 5.11 MIL/uL   Hemoglobin 7.3 (*) 12.0 - 15.0 g/dL   HCT 16.1 (*) 09.6 - 04.5 %   MCV 93.2  78.0 - 100.0 fL   MCH 30.9  26.0 - 34.0 pg   MCHC 33.2  30.0 - 36.0 g/dL   RDW 40.9 (*) 81.1 - 91.4 %   Platelets 124 (*) 150 - 400 K/uL   Comment: REPEATED TO VERIFY     SPECIMEN CHECKED FOR CLOTS     PLATELET COUNT CONFIRMED BY SMEAR   Neutrophils Relative % 82 (*) 43 - 77 %   Lymphocytes Relative 9 (*) 12 - 46 %    Monocytes Relative 7  3 - 12 %   Eosinophils Relative 2  0 - 5 %   Basophils Relative 0  0 - 1 %   Neutro Abs 6.4  1.7 - 7.7 K/uL   Lymphs Abs 0.7  0.7 - 4.0 K/uL   Monocytes Absolute 0.5  0.1 - 1.0 K/uL   Eosinophils Absolute 0.2  0.0 - 0.7 K/uL   Basophils Absolute 0.0  0.0 - 0.1 K/uL   RBC Morphology ELLIPTOCYTES     Comment: SCHISTOCYTES PRESENT (2-5/hpf)   Smear Review PLATELET COUNT CONFIRMED BY SMEAR    BASIC METABOLIC PANEL     Status: Abnormal   Collection Time    03/30/13  5:59 AM      Result Value Range   Sodium 139  135 - 145 mEq/L   Potassium 2.7 (*) 3.5 - 5.1 mEq/L   Comment: REPEATED TO VERIFY     OSAGIE RN AT 0725 ON [id number] BY DLONG   Chloride 114 (*) 96 - 112 mEq/L   CO2 18 (*) 19 - 32 mEq/L   Glucose, Bld 134 (*) 70 - 99 mg/dL   BUN 11  6 - 23 mg/dL   Creatinine, Ser 2.13  0.50 - 1.10 mg/dL   Calcium 7.5 (*) 8.4 - 10.5 mg/dL   GFR calc non Af Amer 75 (*) >90 mL/min   GFR calc Af Amer 87 (*) >90 mL/min   Comment: (NOTE)     The eGFR has been calculated using the CKD EPI equation.     This calculation has not been validated in all clinical situations.     eGFR's persistently <90 mL/min signify possible Chronic Kidney     Disease.  CBC WITH DIFFERENTIAL     Status: Abnormal   Collection Time    03/31/13  4:00 AM      Result Value Range   WBC 8.0  4.0 - 10.5 K/uL   RBC 2.36 (*) 3.87 - 5.11 MIL/uL   Hemoglobin 7.3 (*) 12.0 - 15.0 g/dL   HCT 08.6 (*) 57.8 - 46.9 %   MCV 93.2  78.0 - 100.0 fL   MCH 30.9  26.0 - 34.0 pg   MCHC 33.2  30.0 - 36.0 g/dL   RDW 62.9 (*) 52.8 - 41.3 %   Platelets 109 (*) 150 - 400 K/uL   Comment: REPEATED TO VERIFY     SPECIMEN CHECKED FOR CLOTS     PLATELET COUNT CONFIRMED BY SMEAR   Neutrophils Relative % 82 (*) 43 - 77 %   Lymphocytes Relative 10 (*) 12 - 46 %   Monocytes Relative 7  3 - 12 %   Eosinophils Relative 1  0 - 5 %   Basophils Relative 0  0 - 1 %   Neutro Abs 6.5  1.7 - 7.7  K/uL   Lymphs Abs 0.8  0.7 - 4.0 K/uL    Monocytes Absolute 0.6  0.1 - 1.0 K/uL   Eosinophils Absolute 0.1  0.0 - 0.7 K/uL   Basophils Absolute 0.0  0.0 - 0.1 K/uL   RBC Morphology ACANTHOCYTES     Comment: ELLIPTOCYTES  BASIC METABOLIC PANEL     Status: Abnormal   Collection Time    03/31/13  4:00 AM      Result Value Range   Sodium 137  135 - 145 mEq/L   Potassium 3.4 (*) 3.5 - 5.1 mEq/L   Comment: NO VISIBLE HEMOLYSIS     DELTA CHECK NOTED   Chloride 112  96 - 112 mEq/L   CO2 18 (*) 19 - 32 mEq/L   Glucose, Bld 121 (*) 70 - 99 mg/dL   BUN 11  6 - 23 mg/dL   Creatinine, Ser 7.82  0.50 - 1.10 mg/dL   Calcium 7.7 (*) 8.4 - 10.5 mg/dL   GFR calc non Af Amer 51 (*) >90 mL/min   GFR calc Af Amer 60 (*) >90 mL/min   Comment: (NOTE)     The eGFR has been calculated using the CKD EPI equation.     This calculation has not been validated in all clinical situations.     eGFR's persistently <90 mL/min signify possible Chronic Kidney     Disease.    Imaging / Studies: No results found.  Medications / Allergies: per chart  Antibiotics: Anti-infectives   Start     Dose/Rate Route Frequency Ordered Stop   03/26/13 0600  ceFAZolin (ANCEF) IVPB 2 g/50 mL premix     2 g 100 mL/hr over 30 Minutes Intravenous On call to O.R. 03/25/13 1259 03/25/13 1356   03/26/13 0600  metroNIDAZOLE (FLAGYL) IVPB 500 mg     500 mg 100 mL/hr over 60 Minutes Intravenous On call to O.R. 03/25/13 1259 03/25/13 1412   03/25/13 2200  ceFAZolin (ANCEF) IVPB 2 g/50 mL premix     2 g 100 mL/hr over 30 Minutes Intravenous 3 times per day 03/25/13 1859 03/28/13 1530   03/25/13 2200  metroNIDAZOLE (FLAGYL) IVPB 500 mg     500 mg 100 mL/hr over 60 Minutes Intravenous 3 times per day 03/25/13 1859 03/28/13 1600   03/25/13 1515  clindamycin (CLEOCIN) 900 mg, gentamicin (GARAMYCIN) 240 mg in sodium chloride 0.9 % 1,000 mL for intraperitoneal lavage      Intraperitoneal To Surgery 03/25/13 1507 03/25/13 1720

## 2013-03-31 NOTE — Progress Notes (Signed)
    Subjective:  Feeling well. No complains   . amiodarone  400 mg Oral BID  . antiseptic oral rinse  15 mL Mouth Rinse q12n4p  . aspirin  81 mg Oral Daily  . atorvastatin  20 mg Oral Q supper  . buPROPion  150 mg Oral Daily  . carvedilol  3.125 mg Oral BID WC  . chlorhexidine  15 mL Mouth Rinse BID  . [START ON 04/06/2013] cyanocobalamin  1,000 mcg Intramuscular Q30 days  . enoxaparin (LOVENOX) injection  40 mg Subcutaneous Q24H  . famotidine  20 mg Oral BID  . feeding supplement (ENSURE)  1 Container Oral TID BM  . ferrous sulfate  325 mg Oral TID WC  . furosemide  20 mg Intravenous Daily  . levothyroxine  125 mcg Oral QAC breakfast  . lip balm  1 application Topical BID  . multivitamin with minerals  1 tablet Oral Daily  . mycophenolate  1,000 mg Oral BID  . potassium chloride  40 mEq Oral Daily  . psyllium  1 packet Oral BID  . saccharomyces boulardii  250 mg Oral BID  . sodium chloride  3 mL Intravenous Q12H  . vitamin C  500 mg Oral Daily   Objective:  Vital Signs in the last 24 hours: Temp:  [97.6 F (36.4 C)-98.4 F (36.9 C)] 98.3 F (36.8 C) (11/09 0430) Pulse Rate:  [57-65] 65 (11/09 0430) Resp:  [16-18] 18 (11/09 0430) BP: (107-116)/(43-56) 114/53 mmHg (11/09 0430) SpO2:  [100 %] 100 % (11/09 0430) Weight:  [193 lb 2 oz (87.6 kg)] 193 lb 2 oz (87.6 kg) (11/09 0500)  Intake/Output from previous day: 11/08 0701 - 11/09 0700 In: 620 [P.O.:240; I.V.:380] Out: -   Physical Exam: Pt is alert and oriented, elderly woman in NAD HEENT: normal Neck: JVP - mildly increased Lungs: rales at the basis CV: RRR without murmur or gallop Abd: soft, NT, Positive BS, no hepatomegaly Ext: mild diffuse pitting edema, distal pulses intact and equal Skin: warm/dry no rash  Lab Results:  Recent Labs  03/30/13 0559 03/31/13 0400  WBC 7.8 8.0  HGB 7.3* 7.3*  PLT 124* 109*    Recent Labs  03/30/13 0559 03/31/13 0400  NA 139 137  K 2.7* 3.4*  CL 114* 112  CO2  18* 18*  GLUCOSE 134* 121*  BUN 11 11  CREATININE 0.74 0.97   No results found for this basename: TROPONINI, CK, MB,  in the last 72 hours  Tele: Sinus rhythm, HR 60-70, no episodes of a-fib   Assessment/Plan:   1. Atrial fib, stable in sinus rhythm on oral amiodarone, Continue amiodarone 400 mg BID x 2 weeks then 200 mg daily, but would try to limit amiodarone to 2-3 months duration, Would treat with ASA alone as AF occurred during acute illness and she is maintaining sinus rhythm  2. Cardiomyopathy - suspect stress-induced based on periapical pattern, Continue carvedilol, Avoid ACE as she has had episodic hypotension (SBP 85 last night), Repeat echo about 6 weeks to reassess LV function  3. NSTEMI - conservative Rx recommended considering advanced age and comorbid medical conditions  4. Volume overload, post-op, on iv Lasix 20 mg daily, started on 03/29/13, increase to BID  5. Hypokalemia - improved, but still needs more replacement   Lars Masson, M.D. 03/31/2013, 8:38 AM

## 2013-04-01 DIAGNOSIS — K56609 Unspecified intestinal obstruction, unspecified as to partial versus complete obstruction: Secondary | ICD-10-CM

## 2013-04-01 DIAGNOSIS — R600 Localized edema: Secondary | ICD-10-CM | POA: Diagnosis present

## 2013-04-01 DIAGNOSIS — F411 Generalized anxiety disorder: Secondary | ICD-10-CM

## 2013-04-01 DIAGNOSIS — R609 Edema, unspecified: Secondary | ICD-10-CM

## 2013-04-01 DIAGNOSIS — R5381 Other malaise: Secondary | ICD-10-CM

## 2013-04-01 LAB — CBC WITH DIFFERENTIAL/PLATELET
Basophils Absolute: 0 10*3/uL (ref 0.0–0.1)
Eosinophils Absolute: 0.2 10*3/uL (ref 0.0–0.7)
Eosinophils Relative: 2 % (ref 0–5)
Lymphocytes Relative: 8 % — ABNORMAL LOW (ref 12–46)
Lymphs Abs: 0.8 10*3/uL (ref 0.7–4.0)
MCHC: 33.3 g/dL (ref 30.0–36.0)
Monocytes Relative: 8 % (ref 3–12)
Platelets: 119 10*3/uL — ABNORMAL LOW (ref 150–400)
RBC: 2.54 MIL/uL — ABNORMAL LOW (ref 3.87–5.11)
RDW: 17.3 % — ABNORMAL HIGH (ref 11.5–15.5)
WBC: 9.4 10*3/uL (ref 4.0–10.5)

## 2013-04-01 LAB — BASIC METABOLIC PANEL
CO2: 20 mEq/L (ref 19–32)
Chloride: 110 mEq/L (ref 96–112)
GFR calc Af Amer: 72 mL/min — ABNORMAL LOW (ref >90)
Glucose, Bld: 122 mg/dL — ABNORMAL HIGH (ref 70–99)
Potassium: 4.2 mEq/L (ref 3.5–5.1)
Sodium: 136 mEq/L (ref 135–145)

## 2013-04-01 LAB — MAGNESIUM: Magnesium: 2.1 mg/dL (ref 1.5–2.5)

## 2013-04-01 MED ORDER — FUROSEMIDE 10 MG/ML IJ SOLN
40.0000 mg | Freq: Two times a day (BID) | INTRAMUSCULAR | Status: DC
  Administered 2013-04-01 (×2): 40 mg via INTRAVENOUS
  Filled 2013-04-01 (×3): qty 4

## 2013-04-01 MED ORDER — FUROSEMIDE 10 MG/ML IJ SOLN: 40.0000 mg | Freq: Once | INTRAMUSCULAR | Status: DC

## 2013-04-01 MED ORDER — AMIODARONE HCL 200 MG PO TABS
200.0000 mg | ORAL_TABLET | Freq: Every day | ORAL | Status: DC
  Administered 2013-04-01 – 2013-04-02 (×2): 200 mg via ORAL
  Filled 2013-04-01 (×2): qty 1

## 2013-04-01 MED ORDER — CARVEDILOL 6.25 MG PO TABS
6.2500 mg | ORAL_TABLET | Freq: Two times a day (BID) | ORAL | Status: DC
  Administered 2013-04-01 – 2013-04-02 (×3): 6.25 mg via ORAL
  Filled 2013-04-01 (×6): qty 1

## 2013-04-01 NOTE — Progress Notes (Signed)
Doing well. No n/v. bm are more formed  Alert, nad Soft, nt, nd. Incision c/d/i  Ok for rehab/snf from our POV  [employer]. Andrey Campanile, MD, FACS General, Bariatric, & Minimally Invasive Surgery [facility], [place]

## 2013-04-01 NOTE — Consult Note (Signed)
Physical Medicine and Rehabilitation Consult  Reason for Consult: Deconditioning due to multiple medical issues.  Referring Physician:  Dr. Izola Price   HPI: [name] is a 79 y.o. female with history of PAF, CMV colitis with chronic diarrhea/minimal nausea. Admitted on 03/22/13 with poor po intake, bilious vomiting and inability to keep anything now. She was found to have SBO with incarcerated umbilical hernia and NGT placed per [facility] input. Cardiology consulted for management of A Fib with NSTEMI due to demand ischemia. 2D echo done revealing decrease in EF to 30-35% with question of Takasubo CM due to rapid A fib. She was started on amiodarone as well and is not a candidate for anticoagulation. On 11/03/1, she underwent Lap ventral hernia repair with mesh, lysis of adhesions and lap chole with partial omentectomy by Dr. Michaell Cowing. Diarrhea treated with rectal tube and slowly improving.-- c-diff check negative. Post op fluid overload treated with diuretics. ABLA being monitored--hgb up to 7.9 today. Therapies initiated and family requesting CIR (CIR 9/15-9/23 for deconditioning.)   Review of Systems  Constitutional: Negative for fever and chills.  Eyes: Negative for blurred vision and double vision.  Cardiovascular: Negative for chest pain and palpitations.  Gastrointestinal: Positive for abdominal pain.  Musculoskeletal: Positive for back pain and joint pain.  Neurological: Positive for focal weakness. Negative for headaches.   Past Medical History  Diagnosis Date  . Anemia   . Hyperlipemia   . Hypertension   . Thyroid disease     hypothyroidism  . Anxiety   . Paroxysmal atrial fibrillation   . Vitamin B 12 deficiency   . CHF (congestive heart failure)   . TIA (transient ischemic attack)     4-09  . Microscopic hematuria     benign microscopic hematuria, worked up b Dr Alexis Frock  . Gall stone     porcelain gall bladder with single stone  per CT 02-2009 Dr Birdie Sons  .  Osteoporosis     DEXA on 04-30-10  . Myasthenia gravis     sees Dr. Terrace Arabia   . Ulcerative colitis   . DVT of leg (deep venous thrombosis)     left leg   . CMV colitis    Past Surgical History  Procedure Laterality Date  . Cardiac catheterization      04/1999  . Abdominal hysterectomy      with oophorectomy  . Colonoscopy  1999    normal   . Colonoscopy N/A 12/10/2012    Procedure: COLONOSCOPY;  Surgeon: Iva Boop, MD;  Location: [facility];  Service: Endoscopy;  Laterality: N/A;  . Flexible sigmoidoscopy N/A 01/26/2013    Procedure: FLEXIBLE SIGMOIDOSCOPY;  Surgeon: Iva Boop, MD;  Location: [facility];  Service: Endoscopy;  Laterality: N/A;  . Ventral hernia repair N/A 03/25/2013    Procedure: LAPAROSCOPIC VENTRAL HERNIA REPAIR ;  Surgeon: Ardeth Sportsman, MD;  Location: [facility];  Service: General;  Laterality: N/A;  . Cholecystectomy N/A 03/25/2013    Procedure: LAPAROSCOPIC CHOLECYSTECTOMY WITH INTRAOPERATIVE CHOLANGIOGRAM;  Surgeon: Ardeth Sportsman, MD;  Location: [facility];  Service: General;  Laterality: N/A;  . Insertion of mesh N/A 03/25/2013    Procedure: INSERTION OF MESH;  Surgeon: Ardeth Sportsman, MD;  Location: [facility];  Service: General;  Laterality: N/A;  . Laparoscopic lysis of adhesions N/A 03/25/2013    Procedure: LAPAROSCOPIC LYSIS OF ADHESIONS;  Surgeon: Ardeth Sportsman, MD;  Location: [facility];  Service: General;  Laterality: N/A;  . Omentectomy N/A  03/25/2013    Procedure: PARTIAL OMENTECTOMY;  Surgeon: Ardeth Sportsman, MD;  Location: [facility];  Service: General;  Laterality: N/A;   Family History  Problem Relation Age of Onset  . Heart disease Father    Social History:  Lives with family--independent but sendentary PTA.  reports that she has never smoked. She has never used smokeless tobacco. She reports that she does not drink alcohol or use illicit drugs. Allergies:  Allergies  Allergen Reactions  . Zofran [Ondansetron Hcl]     hypotension  . Furosemide  Rash    REACTION: unspecified  . Ivp Dye [Iodinated Diagnostic Agents] Rash  . Sulfamethoxazole Rash    REACTION: unspecified   Medications Prior to Admission  Medication Sig Dispense Refill  . ALPRAZolam (XANAX) 0.25 MG tablet Take 1 tablet (0.25 mg total) by mouth 2 (two) times daily as needed for anxiety.  30 tablet  0  . ALPRAZolam (XANAX) 1 MG tablet Take 1 mg by mouth at bedtime as needed for sleep.      Marland Kitchen atorvastatin (LIPITOR) 20 MG tablet Take 1 tablet (20 mg total) by mouth daily with supper.  30 tablet  1  . buPROPion (WELLBUTRIN XL) 150 MG 24 hr tablet Take 1 tablet (150 mg total) by mouth every morning.  30 tablet  11  . co-enzyme Q-10 50 MG capsule Take 1 capsule (50 mg total) by mouth at bedtime.  30 capsule  1  . cyanocobalamin (,VITAMIN B-12,) 1000 MCG/ML injection Inject 1,000 mcg into the muscle every 30 (thirty) days.      . feeding supplement (ENSURE COMPLETE) LIQD Take 237 mLs by mouth daily.  30 Bottle  0  . fish oil-omega-3 fatty acids 1000 MG capsule Take 1 capsule (1 g total) by mouth daily.  30 capsule  1  . glucosamine-chondroitin 500-400 MG tablet Take 1-2 tablets by mouth daily. Alternates taking 1 and 2 capsules.      . irbesartan-hydrochlorothiazide (AVALIDE) 300-12.5 MG per tablet Take 1 tablet by mouth daily.  30 tablet  11  . levothyroxine (SYNTHROID, LEVOTHROID) 125 MCG tablet Take 1 tablet (125 mcg total) by mouth daily.  30 tablet  11  . loperamide (IMODIUM) 2 MG capsule Take 1 capsule (2 mg total) by mouth 3 (three) times daily.  90 capsule  0  . Multiple Vitamin (MULTIVITAMIN WITH MINERALS) TABS tablet Take 1 tablet by mouth daily.      . mycophenolate (CELLCEPT) 500 MG tablet Take 2 tablets (1,000 mg total) by mouth 2 (two) times daily.  60 tablet  1  . potassium chloride (K-DUR) 10 MEQ tablet Take 20 mEq by mouth 3 (three) times daily.       . Probiotic Product (PROBIOTIC DAILY PO) Take 1 tablet by mouth daily.       . ranitidine (ZANTAC) 150 MG  tablet Take 150 mg by mouth daily.      Marland Kitchen torsemide (DEMADEX) 10 MG tablet Take 10 mg by mouth 2 (two) times daily with a meal.      . vitamin C (ASCORBIC ACID) 250 MG tablet Take 250 mg by mouth daily.        Home: Home Living Family/patient expects to be discharged to:: Private residence Living Arrangements: Children Available Help at Discharge: Family;Available 24 hours/day (son-in-law home 24/7) Type of Home: House Home Access: Level entry Home Layout: Two level;Able to live on main level with bedroom/bathroom Home Equipment: Walker - 4 wheels;Bedside commode;Grab bars - toilet;Toilet riser Additional  Comments: has built in seat in shower, grab bars around toilet, uses 4 wheeled walker  Functional History: Prior Function Comments: daughter washed hair Functional Status:  Mobility: Bed Mobility Bed Mobility: Not assessed Rolling Right: 4: Min assist Right Sidelying to Sit: 3: Mod assist Sitting - Scoot to Edge of Bed: 4: Min assist Sit to Supine: 1: +2 Total assist Sit to Supine: Patient Percentage: 20% Transfers Transfers: Not assessed Sit to Stand: 1: +2 Total assist;From chair/3-in-1 (pt 40%; initially mod A x 1 from bed) Sit to Stand: Patient Percentage: 50% Stand to Sit: To chair/3-in-1;With upper extremity assist;With armrests;4: Min assist Stand Pivot Transfers: 4: Min assist Ambulation/Gait Ambulation/Gait Assistance: Not tested (comment) Ambulation/Gait Assistance Details: not tested due to fatigue and weakness    ADL: ADL Grooming: Set up Where Assessed - Grooming: Supported sitting Upper Body Bathing: Minimal assistance (manage lines) Where Assessed - Upper Body Bathing: Supported sitting Lower Body Bathing: +2 Total assistance Where Assessed - Lower Body Bathing: Supported sit to stand Upper Body Dressing: Moderate assistance (multiple lines) Where Assessed - Upper Body Dressing: Supported sit to stand Lower Body Dressing: +2 Total assistance Where  Assessed - Lower Body Dressing: Supported sit to stand Toilet Transfer: Performed;+2 Total assistance (mod A x 1 on way over:  see transfers below) Toilet Transfer Method: Stand pivot Acupuncturist:  (chair to bed) Transfers/Ambulation Related to ADLs: pt initially performed spt with mod A to 3:1.  Attempted to stand twice, could not stand erect, for hygiene.  Got help for SPT to chair as pt was much weaker.  BP had dropped 35 points systolic. ADL Comments: pt very motivated; fatiques easily.  Today HgB 7.9.  Decreased BP  Cognition: Cognition Overall Cognitive Status: Within Functional Limits for tasks assessed Orientation Level: Oriented X4 Cognition Arousal/Alertness: Awake/alert Behavior During Therapy: WFL for tasks assessed/performed Overall Cognitive Status: Within Functional Limits for tasks assessed  Blood pressure 121/58, pulse 86, temperature 97.8 F (36.6 C), temperature source Oral, resp. rate 18, height 5\' 4"  (1.626 m), weight 87.4 kg (192 lb 10.9 oz), SpO2 100.00%. Physical Exam  Constitutional: She appears well-developed and well-nourished.  HENT:  Head: Normocephalic.  Eyes: EOM are normal. Pupils are equal, round, and reactive to light.  Neck: Normal range of motion. Neck supple. No JVD present. No tracheal deviation present. No thyromegaly present.  Cardiovascular: Normal rate.   No murmur heard. Respiratory: Effort normal. No respiratory distress.  GI: She exhibits no distension. There is tenderness.  Musculoskeletal:  Edema 1+ in LE's, tr to 1+ in UE's.   Neurological: She is alert. No cranial nerve deficit. Coordination normal.  UE's 4/5. LE's 3/5 prox to 4/5 distally. No gross sensory deficits.   Psychiatric: She has a normal mood and affect. Her behavior is normal. Judgment and thought content normal.    Results for orders placed during the hospital encounter of 03/22/13 (from the past 24 hour(s))  CBC WITH DIFFERENTIAL     Status: Abnormal    Collection Time    04/01/13  4:20 AM      Result Value Range   WBC 9.4  4.0 - 10.5 K/uL   RBC 2.54 (*) 3.87 - 5.11 MIL/uL   Hemoglobin 7.9 (*) 12.0 - 15.0 g/dL   HCT 81.1 (*) 91.4 - 78.2 %   MCV 93.3  78.0 - 100.0 fL   MCH 31.1  26.0 - 34.0 pg   MCHC 33.3  30.0 - 36.0 g/dL   RDW 95.6 (*)  11.5 - 15.5 %   Platelets 119 (*) 150 - 400 K/uL   Neutrophils Relative % 82 (*) 43 - 77 %   Lymphocytes Relative 8 (*) 12 - 46 %   Monocytes Relative 8  3 - 12 %   Eosinophils Relative 2  0 - 5 %   Basophils Relative 0  0 - 1 %   Neutro Abs 7.6  1.7 - 7.7 K/uL   Lymphs Abs 0.8  0.7 - 4.0 K/uL   Monocytes Absolute 0.8  0.1 - 1.0 K/uL   Eosinophils Absolute 0.2  0.0 - 0.7 K/uL   Basophils Absolute 0.0  0.0 - 0.1 K/uL  BASIC METABOLIC PANEL     Status: Abnormal   Collection Time    04/01/13  4:20 AM      Result Value Range   Sodium 136  135 - 145 mEq/L   Potassium 4.2  3.5 - 5.1 mEq/L   Chloride 110  96 - 112 mEq/L   CO2 20  19 - 32 mEq/L   Glucose, Bld 122 (*) 70 - 99 mg/dL   BUN 8  6 - 23 mg/dL   Creatinine, Ser 1.61  0.50 - 1.10 mg/dL   Calcium 7.9 (*) 8.4 - 10.5 mg/dL   GFR calc non Af Amer 62 (*) >90 mL/min   GFR calc Af Amer 72 (*) >90 mL/min   No results found.  Assessment/Plan: Diagnosis: deconditioning related to incarcerated small bowel s/p chole and lysis of adhesions 1. Does the need for close, 24 hr/day medical supervision in concert with the patient's rehab needs make it unreasonable for this patient to be served in a less intensive setting? Yes 2. Co-Morbidities requiring supervision/potential complications: hx of afib, ARF, anxiety 3. Due to bladder management, bowel management, safety, skin/wound care, disease management, medication administration, pain management and patient education, does the patient require 24 hr/day rehab nursing? Yes 4. Does the patient require coordinated care of a physician, rehab nurse, PT (1-2 hrs/day, 5 days/week) and OT (1-2 hrs/day, 5 days/week)  to address physical and functional deficits in the context of the above medical diagnosis(es)? Yes Addressing deficits in the following areas: balance, endurance, locomotion, strength, transferring, bowel/bladder control, bathing, dressing, feeding, grooming and toileting 5. Can the patient actively participate in an intensive therapy program of at least 3 hrs of therapy per day at least 5 days per week? Yes 6. The potential for patient to make measurable gains while on inpatient rehab is excellent 7. Anticipated functional outcomes upon discharge from inpatient rehab are supervision to min assist with PT, supervision to min assist with OT, n/a with SLP. 8. Estimated rehab length of stay to reach the above functional goals is: 12-16 days 9. Does the patient have adequate social supports to accommodate these discharge functional goals? Yes 10. Anticipated D/C setting: Home 11. Anticipated post D/C treatments: HH therapy 12. Overall Rehab/Functional Prognosis: excellent  RECOMMENDATIONS: This patient's condition is appropriate for continued rehabilitative care in the following setting: CIR Patient has agreed to participate in recommended program. Yes Note that insurance prior authorization may be required for reimbursement for recommended care.  Comment: Rehab RN to follow up.   Ranelle Oyster, MD, [place] [facility]     04/01/2013

## 2013-04-01 NOTE — Progress Notes (Signed)
    Subjective:  Denies dyspnea or chest pain; diarrhea persists but improved.   Marland Kitchen amiodarone  400 mg Oral BID  . antiseptic oral rinse  15 mL Mouth Rinse q12n4p  . aspirin  81 mg Oral Daily  . atorvastatin  20 mg Oral Q supper  . buPROPion  150 mg Oral Daily  . carvedilol  3.125 mg Oral BID WC  . chlorhexidine  15 mL Mouth Rinse BID  . [START ON 04/06/2013] cyanocobalamin  1,000 mcg Intramuscular Q30 days  . enoxaparin (LOVENOX) injection  40 mg Subcutaneous Q24H  . famotidine  20 mg Oral BID  . feeding supplement (ENSURE)  1 Container Oral TID BM  . ferrous sulfate  325 mg Oral TID WC  . furosemide  20 mg Intravenous BID  . levothyroxine  125 mcg Oral QAC breakfast  . lip balm  1 application Topical BID  . multivitamin with minerals  1 tablet Oral Daily  . mycophenolate  1,000 mg Oral BID  . potassium chloride  40 mEq Oral BID  . psyllium  1 packet Oral BID  . saccharomyces boulardii  250 mg Oral BID  . sodium chloride  3 mL Intravenous Q12H  . vitamin C  500 mg Oral Daily   Objective:  Vital Signs in the last 24 hours: Temp:  [97.7 F (36.5 C)-98.2 F (36.8 C)] 97.8 F (36.6 C) (11/10 0545) Pulse Rate:  [57-86] 86 (11/10 0545) Resp:  [18] 18 (11/10 0545) BP: (101-123)/(50-59) 121/58 mmHg (11/10 0545) SpO2:  [100 %] 100 % (11/10 0545) Weight:  [192 lb 10.9 oz (87.4 kg)] 192 lb 10.9 oz (87.4 kg) (11/10 0545)  Intake/Output from previous day: 11/09 0701 - 11/10 0700 In: 348.3 [P.O.:120; I.V.:228.3] Out: 900 [Urine:900]  Physical Exam: Pt is alert and oriented, elderly woman in NAD HEENT: normal Neck: supple Lungs: CTA CV: RRR  Abd: soft, NT, Positive BS Ext: 1+ edema Skin: warm/dry no rash  Lab Results:  Recent Labs  03/31/13 0400 04/01/13 0420  WBC 8.0 9.4  HGB 7.3* 7.9*  PLT 109* 119*    Recent Labs  03/31/13 0400 04/01/13 0420  NA 137 136  K 3.4* 4.2  CL 112 110  CO2 18* 20  GLUCOSE 121* 122*  BUN 11 8  CREATININE 0.97 0.83     Tele: Sinus rhythm   Assessment/Plan:   1. Atrial fib, stable in sinus rhythm on oral amiodarone; change to 200 mg daily. Would treat with ASA alone as AF occurred during acute illness and she is maintaining sinus rhythm. Also with recent surgery.  2. Cardiomyopathy - suspect stress-induced based on periapical pattern, Continue carvedilol (increased to 6.25 BID), Avoid ACE as she has had episodic hypotension, Repeat echo about 6 weeks following DC to reassess LV function. If remains reduced, add back ARB at that time.  3. NSTEMI - conservative Rx recommended considering advanced age and comorbid medical conditions  4. Volume overload, post-op - change lasix to 40 BID and follow renal function.  5. Hypokalemia    Olga Millers, M.D. 04/01/2013, 7:01 AM

## 2013-04-01 NOTE — Progress Notes (Signed)
[facility] PROGRESS NOTE  [name] EAV:[id number] DOB: 10/07/1924 DOA: 03/22/2013 PCP: Nelwyn Salisbury, MD  Assessment/Plan: Incarcerated hernia and small bowel within the hernia/ N&V (nausea and vomiting)  - likely related to loop of small bowel within the incarcerated hernia seen on CT abdomen  - status post laparoscopic ventral hernia repair 03/25/2013 and no subsequent complications  - Patient drinking and eating well, saline lock IV  - transfuse for hemoglobin of less than 7 -Per surgery patient ready for rehabilitation/SNF    Atrial fibrillation with RVR  - Continue Coreg 6.25 mg BID, and PO Amiodarone 200 mg Daily . Currently in NSR  - no anticoagulation at this time, only SCDs for DVT prophylaxis per cardio   NSTEMI (non-ST elevated myocardial infarction)  - cardiac enzymes trended down, 1.23 --> 0.62; currently patient asymptomatic (negative CP/SOB)  - 2 D ECHO on this admission - EF 30% to 35%; akinesisof the entire apical myocardium; severe hypokinesis of the mid-distalanteroseptal, anterolateral and inferior myocardium; grade 1 diastolic dysfunction  - cardio is following  - now on coreg 6.25 mg PO BID, continue Amiodarone  Systolic CHF ; see NSTEMI   Pulmonary hypertension; see NSTEMI   Acute renal failure  - likely due to GI losses, dehydration, pre-renal etiology, creatinine now within normal limits - follow up CMP in am   Hypokalemia -Potassium, WNL  -Patient's potassium goal> 4 meq/L  -Recheck magnesium level. Magnesium goal> 2 mg/dL  Anemia of chronic disease  - secondary to history of thyroid disease;  - hemoglobin 10.4 --> 9.7 --> 7.9 stable continue to monitor     Acute hypoxic respiratory failure  - unclear etiology  - CXR did not reveal acute cardiopulmonary porcess  - resp status stable; O2 saturation 100% on room air  HYPOTHYROIDISM  -Continue Synthroid   ANXIETY  - Surgery DC'd Ativan and startesd patient on Xanax 1mg   each bedtime, PRN  Incarcerated incisional hernia  - surgery is following; ready for discharge per surgery   Moderate protein-calorie malnutrition  - may improve once pt resumes oral intake   Pedal edema -1 dose IV Lasix mg  Diarrhea; Clostridium Difficile PCR (negative)    Code Status: Full  Family Communication:  Disposition Plan:    Consultants: Cardiology  Surgery   Procedures: S/P below Surgery 03/25/2013: LAPAROSCOPIC VENTRAL HERNIA REPAIR  LAPAROSCOPIC CHOLECYSTECTOMY WITH INTRAOPERATIVE CHOLANGIOGRAM  INSERTION OF MESH  LAPAROSCOPIC LYSIS OF ADHESIONS  PARTIAL OMENTECTOMY  Echocardiogram 03/23/2013] - Left ventricle: The cavity size was normal. Moderate concentric hypertrophy.  -Systolic function was moderately to severely reduced.  -LVEF; 30% to 35%. -Akinesis of the entireapical myocardium; Severe hypokinesis of the mid-distalanteroseptal, anterolateral, and inferior myocardium.  -Doppler parameters are consistent with abnormal left ventricular relaxation (grade 1 diastolic dysfunction). - Aortic valve: Moderate diffuse thickening and calcification, consistent with sclerosis. - Mitral valve: Severely calcified annulus. Mild regurgitation. - Atrial septum: There was increased thickness of the septum, consistent with lipomatous hypertrophy. - Tricuspid valve: Moderate regurgitation. - Pulmonary arteries: PA peak pressure: 37mm Hg (S). Pulmonary HTN     Antibiotics:  Flagyl 03/25/2013 --> stopped 11/6 Cefazolin 03/25/2013 --> stopped 11/6    HPI/Subjective: 79yo WF PMHx  Ulcerative Colitis, CMV colitis, hypertension, hypothyroidism, anxiety who presented to [facility] ED 03/22/2013 with complaints of intractable nausea and vomiting associated with poor oral intake over past 1 week prior to this admission. Patient was subsequently found to have incarcerated hernia adn loop of small bowel within the hernia based on CT  abdomen. Please note that the reason why patient  required SDU admission is because initial vital signs in ED were BP 70/35, HR 39-144 and T max 98.3 F. Oxygen saturation was 84% on room air. BP has improved with initial IV fluids to 143/80 (after 1.5 L NS) but then bounced back to 88/51.Patient however did not require pressor support. Hospital course is complicated with additional problem of A fib with RVR and NSTEMI. Patient has converted to NSR with digoxin and amiodarone but now requires amiodarone only per cardiology. In addition, pt S/P laparoscopic ventral hernia repair 03/25/2013. 03/27/2013 states negative N./V., positive pain with movement, positive bilateral hand swelling without pain secondary to peripheral IV lines, negative CP, negative SOB. States has not started taking sips of fluids yet. TODAY states (-) N/V, patient eating and drinking, has been out working with physical therapy.    Objective: Filed Vitals:   03/31/13 1535 03/31/13 1718 03/31/13 2030 04/01/13 0545  BP: 101/50 109/59 120/54 121/58  Pulse: 58 57 58 86  Temp: 98.2 F (36.8 C)  97.7 F (36.5 C) 97.8 F (36.6 C)  TempSrc: Oral  Oral Oral  Resp: 18  18 18   Height:      Weight:    87.4 kg (192 lb 10.9 oz)  SpO2: 100%  100% 100%    Intake/Output Summary (Last 24 hours) at 04/01/13 0811 Last data filed at 03/31/13 1825  Gross per 24 hour  Intake 348.33 ml  Output    900 ml  Net -551.67 ml   Filed Weights   03/30/13 0500 03/31/13 0500 04/01/13 0545  Weight: 87.4 kg (192 lb 10.9 oz) 87.6 kg (193 lb 2 oz) 87.4 kg (192 lb 10.9 oz)    Exam:   General: A./O. x4, NAD  Cardiovascular: Regular rate, negative murmurs rubs or gallops, DP/PT pulses 1+ bilateral  Respiratory: Clear to auscultation bilateral  Abdomen: Patient minor tenderness in the umbilical area when palpating. Umbilical incision healing well negative sign of infection positive bowel sounds   Musculoskeletal: Port in place over right chest secondary to poor IV access, negative signs of  infection, positive pedal edema to mid thigh bilaterally 1-2+       Data Reviewed: Basic Metabolic Panel:  Recent Labs Lab 03/27/13 0910  03/28/13 0240 03/28/13 0935 03/28/13 2359 03/30/13 0559 03/31/13 0400 04/01/13 0420  NA 144  --  143 143  --  139 137 136  K 2.9*  < > 3.5 3.3* 3.7 2.7* 3.4* 4.2  CL 118*  --  118* 115*  --  114* 112 110  CO2 16*  --  13* 14*  --  18* 18* 20  GLUCOSE 116*  --  89 97  --  134* 121* 122*  BUN 14  --  13 13  --  11 11 8   CREATININE 0.84  --  0.82 0.88  --  0.74 0.97 0.83  CALCIUM 7.7*  --  7.8* 8.4  --  7.5* 7.7* 7.9*  MG 1.3*  --  1.3* 1.7 1.5  --  1.8  --   < > = values in this interval not displayed. Liver Function Tests:  Recent Labs Lab 03/27/13 0910 03/28/13 0935  AST 17 13  ALT 6 <5  ALKPHOS 49 52  BILITOT 0.2* 0.2*  PROT 3.8* 4.1*  ALBUMIN 1.5* 1.7*   No results found for this basename: LIPASE, AMYLASE,  in the last 168 hours No results found for this basename: AMMONIA,  in the last  168 hours CBC:  Recent Labs Lab 03/28/13 0935 03/29/13 0400 03/30/13 0559 03/31/13 0400 04/01/13 0420  WBC 16.4* 10.5 7.8 8.0 9.4  NEUTROABS 14.8* 9.0* 6.4 6.5 7.6  HGB 8.6* 7.9* 7.3* 7.3* 7.9*  HCT 26.4* 23.7* 22.0* 22.0* 23.7*  MCV 92.0 91.2 93.2 93.2 93.3  PLT 170 176 124* 109* 119*   Cardiac Enzymes: No results found for this basename: CKTOTAL, CKMB, CKMBINDEX, TROPONINI,  in the last 168 hours BNP (last 3 results)  Recent Labs  12/08/12 0026 03/27/13 0120  PROBNP 465.5* 5592.0*   CBG:  Recent Labs Lab 03/25/13 1220  GLUCAP 72    Recent Results (from the past 240 hour(s))  MRSA PCR SCREENING     Status: None   Collection Time    03/22/13  8:51 PM      Result Value Range Status   MRSA by PCR NEGATIVE  NEGATIVE Final   Comment:            The GeneXpert MRSA Assay (FDA     approved for NASAL specimens     only), is one component of a     comprehensive MRSA colonization     surveillance program. It is not      intended to diagnose MRSA     infection nor to guide or     monitor treatment for     MRSA infections.  CLOSTRIDIUM DIFFICILE BY PCR     Status: None   Collection Time    03/29/13  6:40 AM      Result Value Range Status   C difficile by pcr NEGATIVE  NEGATIVE Final   Comment: Performed at [facility]     Studies: No results found.  Scheduled Meds: . amiodarone  200 mg Oral Daily  . antiseptic oral rinse  15 mL Mouth Rinse q12n4p  . aspirin  81 mg Oral Daily  . atorvastatin  20 mg Oral Q supper  . buPROPion  150 mg Oral Daily  . carvedilol  6.25 mg Oral BID WC  . chlorhexidine  15 mL Mouth Rinse BID  . [START ON 04/06/2013] cyanocobalamin  1,000 mcg Intramuscular Q30 days  . enoxaparin (LOVENOX) injection  40 mg Subcutaneous Q24H  . famotidine  20 mg Oral BID  . feeding supplement (ENSURE)  1 Container Oral TID BM  . ferrous sulfate  325 mg Oral TID WC  . furosemide  40 mg Intravenous BID  . levothyroxine  125 mcg Oral QAC breakfast  . lip balm  1 application Topical BID  . multivitamin with minerals  1 tablet Oral Daily  . mycophenolate  1,000 mg Oral BID  . potassium chloride  40 mEq Oral BID  . psyllium  1 packet Oral BID  . saccharomyces boulardii  250 mg Oral BID  . sodium chloride  3 mL Intravenous Q12H  . vitamin C  500 mg Oral Daily   Continuous Infusions: . sodium chloride 20 mL/hr (03/28/13 5366)    Principal Problem:   NSTEMI (non-ST elevated myocardial infarction) Active Problems:   HYPOTHYROIDISM   ANXIETY   Acute renal failure   Atrial fibrillation with RVR   N&V (nausea and vomiting)   Porcelain gallbladder   Incisional hernia, incarcerated s/p lap repair 03/25/2013   Anemia of chronic disease   Acute respiratory failure with hypoxia   Protein-calorie malnutrition, severe   SBO (small bowel obstruction)   Systolic CHF   Pulmonary hypertension   Hypokalemia    Time  spent: 40 minutes    Malikiah Debarr, Roselind Messier  [facility] Pager  [phone]. If 7PM-7AM, please contact night-coverage at [email or web address], password [facility] 04/01/2013, 8:11 AM  LOS: 10 days

## 2013-04-01 NOTE — Progress Notes (Signed)
Physical Therapy Treatment Patient Details Name: [name] MRN: [id number] DOB: [birth year] Today's Date: 04/01/2013 Time: 1206-1221 PT Time Calculation (min): 15 min  PT Assessment / Plan / Recommendation  History of Present Illness Pt is an 79 year old female with past medical history of ulcerative colitis, CMV colitis, hypertension, hypothyroidism, anxiety who presented to [facility] ED 03/22/2013 with complaints of intractable nausea and vomiting associated with poor oral intake over past 1 week prior to this admission. Patient was subsequently found to have incarcerated hernia adn loop of small bowel within the hernia based on CT abdomen. Please note that the reason why patient required SDU admission is because initial vital signs in ED were BP 70/35, HR 39-144 and T max 98.3 F. Oxygen saturation was 84% on room air. BP has improved with initial IV fluids to 143/80 (after 1.5 L NS) but then bounced back to 88/51.Patient however did not require pressor support.  Hospital course is complicated with additional problem of A fib with RVR and NSTEMI. Patient has converted to NSR with digoxin and amiodarone but now requires amiodarone only per cardiology. In addition, pt underwent laparoscopic ventral hernia repair 03/25/2013.   PT Comments   *Pt performed stand pivot transfer with assist and BLE exercises in bed. Gait deferred due to dizziness in sitting. **  Follow Up Recommendations  SNF     Does the patient have the potential to tolerate intense rehabilitation     Barriers to Discharge        Equipment Recommendations  None recommended by PT    Recommendations for Other Services    Frequency Min 3X/week   Progress towards PT Goals Progress towards PT goals: Progressing toward goals  Plan Current plan remains appropriate    Precautions / Restrictions Precautions Precautions: Fall Precaution Comments: monitor BP, pt orthostatic on 03/29/13 Restrictions Weight Bearing Restrictions: No    Pertinent Vitals/Pain *0/10**    Mobility  Bed Mobility Bed Mobility: Sit to Supine Sit to Supine: 3: Mod assist Sit to Supine: Patient Percentage: 60% Details for Bed Mobility Assistance: assist for BLEs into bed Transfers Transfers: Stand Pivot Transfers Sit to Stand: 1: +2 Total assist;From chair/3-in-1 Sit to Stand: Patient Percentage: 60% Stand to Sit: 1: +2 Total assist;To bed Stand to Sit: Patient Percentage: 60% Stand Pivot Transfers: 1: +2 Total assist Stand Pivot Transfers: Patient Percentage: 60% Details for Transfer Assistance: +2 BSC to bed with +2 assist for balance, decreased WB on RLE 2* weakness, increased time, pt used bedrail for UE support Ambulation/Gait Ambulation/Gait Assistance: Not tested (comment) Ambulation/Gait Assistance Details: NT 2* fatigue and pt reported some dizziness while sitting on [facility]    Exercises Total Joint Exercises Towel Squeeze: AROM;Both;10 reps;Supine General Exercises - Lower Extremity Ankle Circles/Pumps: AROM;20 reps;Supine;Both (rest breaks required between each exercise due to fatigue.) Short Arc Quad: AAROM;Both;15 reps;Supine Heel Slides: 20 reps;Supine;Both;AAROM Hip ABduction/ADduction: Supine;Both;AAROM;15 reps Straight Leg Raises: AAROM;Both;15 reps;Supine   PT Diagnosis:    PT Problem List:   PT Treatment Interventions:     PT Goals (current goals can now be found in the care plan section) Acute Rehab PT Goals Patient Stated Goal: none specified PT Goal Formulation: With patient Time For Goal Achievement: 04/10/13 Potential to Achieve Goals: Good  Visit Information  Last PT Received On: 04/01/13 Assistance Needed: +2 History of Present Illness: Pt is an 79 year old female with past medical history of ulcerative colitis, CMV colitis, hypertension, hypothyroidism, anxiety who presented to [facility] ED 03/22/2013 with complaints  of intractable nausea and vomiting associated with poor oral intake over past 1 week prior to  this admission. Patient was subsequently found to have incarcerated hernia adn loop of small bowel within the hernia based on CT abdomen. Please note that the reason why patient required SDU admission is because initial vital signs in ED were BP 70/35, HR 39-144 and T max 98.3 F. Oxygen saturation was 84% on room air. BP has improved with initial IV fluids to 143/80 (after 1.5 L NS) but then bounced back to 88/51.Patient however did not require pressor support.  Hospital course is complicated with additional problem of A fib with RVR and NSTEMI. Patient has converted to NSR with digoxin and amiodarone but now requires amiodarone only per cardiology. In addition, pt underwent laparoscopic ventral hernia repair 03/25/2013.    Subjective Data  Patient Stated Goal: none specified   Cognition  Cognition Arousal/Alertness: Awake/alert Behavior During Therapy: WFL for tasks assessed/performed Overall Cognitive Status: Within Functional Limits for tasks assessed    Balance     End of Session PT - End of Session Activity Tolerance: Patient limited by fatigue Patient left: with call bell/phone within reach;in bed Nurse Communication: Mobility status   GP     Ralene Bathe Kistler 04/01/2013, 12:48 PM [phone]

## 2013-04-01 NOTE — Progress Notes (Signed)
7 Days Post-Op  Subjective: Pt feels much better.  Abdominal pain minimal, no N/V.  Tolerating HH diet well.  Ambulating but still weak, working with PT/OT, trying to go to CIR for rehab as opposed to SNF.  BM's less watery and less frequent.  Urinating well.    Objective: Vital signs in last 24 hours: Temp:  [97.7 F (36.5 C)-98.2 F (36.8 C)] 97.8 F (36.6 C) (11/10 0545) Pulse Rate:  [57-86] 86 (11/10 0545) Resp:  [18] 18 (11/10 0545) BP: (101-121)/(50-59) 121/58 mmHg (11/10 0545) SpO2:  [100 %] 100 % (11/10 0545) Weight:  [192 lb 10.9 oz (87.4 kg)] 192 lb 10.9 oz (87.4 kg) (11/10 0545) Last BM Date: 03/31/13  Intake/Output from previous day: 11/09 0701 - 11/10 0700 In: 348.3 [P.O.:120; I.V.:228.3] Out: 900 [Urine:900] Intake/Output this shift:    PE: Gen:  Alert, NAD, pleasant Abd: Soft, minimal tenderness, ND, +BS, no HSM, incisions C/D/I, soft mobile subcutaneous mass which could be a seroma vs hematoma (not significantly tender to palpation, no skin erythema)  Lab Results:   Recent Labs  03/31/13 0400 04/01/13 0420  WBC 8.0 9.4  HGB 7.3* 7.9*  HCT 22.0* 23.7*  PLT 109* 119*   BMET  Recent Labs  03/31/13 0400 04/01/13 0420  NA 137 136  K 3.4* 4.2  CL 112 110  CO2 18* 20  GLUCOSE 121* 122*  BUN 11 8  CREATININE 0.97 0.83  CALCIUM 7.7* 7.9*   PT/INR No results found for this basename: LABPROT, INR,  in the last 72 hours CMP     Component Value Date/Time   NA 136 04/01/2013 0420   K 4.2 04/01/2013 0420   CL 110 04/01/2013 0420   CO2 20 04/01/2013 0420   GLUCOSE 122* 04/01/2013 0420   BUN 8 04/01/2013 0420   CREATININE 0.83 04/01/2013 0420   CALCIUM 7.9* 04/01/2013 0420   PROT 4.1* 03/28/2013 0935   ALBUMIN 1.7* 03/28/2013 0935   AST 13 03/28/2013 0935   ALT <5 03/28/2013 0935   ALKPHOS 52 03/28/2013 0935   BILITOT 0.2* 03/28/2013 0935   GFRNONAA 62* 04/01/2013 0420   GFRAA 72* 04/01/2013 0420   Lipase     Component Value Date/Time   LIPASE  15 03/22/2013 1450       Studies/Results: No results found.  Anti-infectives: Anti-infectives   Start     Dose/Rate Route Frequency Ordered Stop   03/26/13 0600  ceFAZolin (ANCEF) IVPB 2 g/50 mL premix     2 g 100 mL/hr over 30 Minutes Intravenous On call to O.R. 03/25/13 1259 03/25/13 1356   03/26/13 0600  metroNIDAZOLE (FLAGYL) IVPB 500 mg     500 mg 100 mL/hr over 60 Minutes Intravenous On call to O.R. 03/25/13 1259 03/25/13 1412   03/25/13 2200  ceFAZolin (ANCEF) IVPB 2 g/50 mL premix     2 g 100 mL/hr over 30 Minutes Intravenous 3 times per day 03/25/13 1859 03/28/13 1530   03/25/13 2200  metroNIDAZOLE (FLAGYL) IVPB 500 mg     500 mg 100 mL/hr over 60 Minutes Intravenous 3 times per day 03/25/13 1859 03/28/13 1600   03/25/13 1515  clindamycin (CLEOCIN) 900 mg, gentamicin (GARAMYCIN) 240 mg in sodium chloride 0.9 % 1,000 mL for intraperitoneal lavage      Intraperitoneal To Surgery 03/25/13 1507 03/25/13 1720       Assessment/Plan POD #7 s/p laparoscopic ventral hernia repair, laparoscopic chole with IOC, insertion of mesh, LOA, partial omentectomy  1. Tolerating HH  diet 2. VTE proph - SCD's, lovenox  3. IS and ambulate OOB - PT/OT working with pt  4. Okay from our standpoint to d/c to CIR/SNF  5.  Pt to f/u with Dr. Michaell Cowing in 2-3 weeks  Acute on chronic disease anemia - Hgb stable at 7.9 AFIB, cardiomyopathy, and NSTEMI being managed by cardiology - appreciate their help  Hypokalemia/Hypomag - being managed by primary team  Leukocytosis - resolved Disp: To SNF/CIR when medically stable for d/c   Pathology report for gallbladder/omentum: - EXTENSIVELY HYALINIZED AND CALCIFIED GALLBLADDER WITH MIXED ACUTE AND CHRONIC INFLAMMATION. - NEGATIVE FOR DYSPLASIA OR MALIGNANCY. - CHOLELITHIASIS. - SEPARATELY SUBMITTED BENIGN OMENTAL TISSUE WITH VASCULAR CONGESTION, HEMORRHAGE, AND INFLAMMATION CONSISTENT WITH A HISTORY OF INCARCERATED HERNIA.     LOS: 10 days     [name], [name] 04/01/2013, 8:51 AM Pager: [phone]

## 2013-04-02 ENCOUNTER — Inpatient Hospital Stay (HOSPITAL_COMMUNITY)
Admission: RE | Admit: 2013-04-02 | Discharge: 2013-04-11 | DRG: 945 | Disposition: A | Discharge status: Home Health | Payer: Medicare Other | Source: Intra-hospital | Attending: Physical Medicine & Rehabilitation | Admitting: Physical Medicine & Rehabilitation

## 2013-04-02 DIAGNOSIS — I214 Non-ST elevation (NSTEMI) myocardial infarction: Secondary | ICD-10-CM

## 2013-04-02 DIAGNOSIS — R5381 Other malaise: Secondary | ICD-10-CM

## 2013-04-02 DIAGNOSIS — Z5189 Encounter for other specified aftercare: Principal | ICD-10-CM

## 2013-04-02 DIAGNOSIS — Z8673 Personal history of transient ischemic attack (TIA), and cerebral infarction without residual deficits: Secondary | ICD-10-CM

## 2013-04-02 DIAGNOSIS — K420 Umbilical hernia with obstruction, without gangrene: Secondary | ICD-10-CM

## 2013-04-02 DIAGNOSIS — M810 Age-related osteoporosis without current pathological fracture: Secondary | ICD-10-CM

## 2013-04-02 DIAGNOSIS — E538 Deficiency of other specified B group vitamins: Secondary | ICD-10-CM

## 2013-04-02 DIAGNOSIS — K8010 Calculus of gallbladder with chronic cholecystitis without obstruction: Secondary | ICD-10-CM

## 2013-04-02 DIAGNOSIS — D62 Acute posthemorrhagic anemia: Secondary | ICD-10-CM

## 2013-04-02 DIAGNOSIS — I4891 Unspecified atrial fibrillation: Secondary | ICD-10-CM

## 2013-04-02 DIAGNOSIS — Z86718 Personal history of other venous thrombosis and embolism: Secondary | ICD-10-CM

## 2013-04-02 DIAGNOSIS — I509 Heart failure, unspecified: Secondary | ICD-10-CM

## 2013-04-02 DIAGNOSIS — I5022 Chronic systolic (congestive) heart failure: Secondary | ICD-10-CM

## 2013-04-02 DIAGNOSIS — I10 Essential (primary) hypertension: Secondary | ICD-10-CM

## 2013-04-02 DIAGNOSIS — E785 Hyperlipidemia, unspecified: Secondary | ICD-10-CM

## 2013-04-02 DIAGNOSIS — R600 Localized edema: Secondary | ICD-10-CM

## 2013-04-02 DIAGNOSIS — D638 Anemia in other chronic diseases classified elsewhere: Secondary | ICD-10-CM

## 2013-04-02 DIAGNOSIS — A0839 Other viral enteritis: Secondary | ICD-10-CM

## 2013-04-02 DIAGNOSIS — K430 Incisional hernia with obstruction, without gangrene: Secondary | ICD-10-CM | POA: Diagnosis present

## 2013-04-02 DIAGNOSIS — K8000 Calculus of gallbladder with acute cholecystitis without obstruction: Secondary | ICD-10-CM

## 2013-04-02 DIAGNOSIS — I428 Other cardiomyopathies: Secondary | ICD-10-CM

## 2013-04-02 DIAGNOSIS — B259 Cytomegaloviral disease, unspecified: Secondary | ICD-10-CM

## 2013-04-02 DIAGNOSIS — Z79899 Other long term (current) drug therapy: Secondary | ICD-10-CM

## 2013-04-02 DIAGNOSIS — K56609 Unspecified intestinal obstruction, unspecified as to partial versus complete obstruction: Secondary | ICD-10-CM

## 2013-04-02 DIAGNOSIS — F411 Generalized anxiety disorder: Secondary | ICD-10-CM

## 2013-04-02 DIAGNOSIS — Z9889 Other specified postprocedural states: Secondary | ICD-10-CM

## 2013-04-02 DIAGNOSIS — E039 Hypothyroidism, unspecified: Secondary | ICD-10-CM

## 2013-04-02 DIAGNOSIS — G7000 Myasthenia gravis without (acute) exacerbation: Secondary | ICD-10-CM

## 2013-04-02 LAB — COMPREHENSIVE METABOLIC PANEL
Albumin: 1.7 g/dL — ABNORMAL LOW (ref 3.5–5.2)
Alkaline Phosphatase: 43 U/L (ref 39–117)
BUN: 7 mg/dL (ref 6–23)
CO2: 20 mEq/L (ref 19–32)
Calcium: 7.8 mg/dL — ABNORMAL LOW (ref 8.4–10.5)
Chloride: 108 mEq/L (ref 96–112)
GFR calc Af Amer: 86 mL/min — ABNORMAL LOW (ref >90)
GFR calc non Af Amer: 74 mL/min — ABNORMAL LOW (ref >90)
Glucose, Bld: 98 mg/dL (ref 70–99)
Potassium: 4.4 mEq/L (ref 3.5–5.1)
Sodium: 134 mEq/L — ABNORMAL LOW (ref 135–145)
Total Protein: 3.8 g/dL — ABNORMAL LOW (ref 6.0–8.3)

## 2013-04-02 LAB — CBC WITH DIFFERENTIAL/PLATELET
Basophils Absolute: 0 10*3/uL (ref 0.0–0.1)
Basophils Relative: 0 % (ref 0–1)
Eosinophils Absolute: 0.2 10*3/uL (ref 0.0–0.7)
Eosinophils Relative: 2 % (ref 0–5)
Lymphs Abs: 0.8 10*3/uL (ref 0.7–4.0)
MCH: 30.7 pg (ref 26.0–34.0)
MCHC: 32.3 g/dL (ref 30.0–36.0)
MCV: 95.1 fL (ref 78.0–100.0)
Monocytes Relative: 8 % (ref 3–12)
Neutro Abs: 7.6 10*3/uL (ref 1.7–7.7)
Neutrophils Relative %: 82 % — ABNORMAL HIGH (ref 43–77)
RBC: 2.44 MIL/uL — ABNORMAL LOW (ref 3.87–5.11)
RDW: 17.2 % — ABNORMAL HIGH (ref 11.5–15.5)

## 2013-04-02 MED ORDER — FUROSEMIDE 40 MG PO TABS
40.0000 mg | ORAL_TABLET | Freq: Two times a day (BID) | ORAL | Status: DC
  Administered 2013-04-02 – 2013-04-04 (×4): 40 mg via ORAL
  Filled 2013-04-02 (×7): qty 1

## 2013-04-02 MED ORDER — ALPRAZOLAM 0.25 MG PO TABS
0.2500 mg | ORAL_TABLET | Freq: Two times a day (BID) | ORAL | Status: DC | PRN
  Filled 2013-04-02: qty 1

## 2013-04-02 MED ORDER — BUPROPION HCL ER (XL) 150 MG PO TB24
150.0000 mg | ORAL_TABLET | Freq: Every day | ORAL | Status: DC
  Administered 2013-04-03 – 2013-04-11 (×9): 150 mg via ORAL
  Filled 2013-04-02 (×10): qty 1

## 2013-04-02 MED ORDER — CARVEDILOL 6.25 MG PO TABS
6.2500 mg | ORAL_TABLET | Freq: Two times a day (BID) | ORAL | Status: DC
  Administered 2013-04-02 – 2013-04-10 (×11): 6.25 mg via ORAL
  Filled 2013-04-02 (×20): qty 1

## 2013-04-02 MED ORDER — POTASSIUM CHLORIDE CRYS ER 20 MEQ PO TBCR
40.0000 meq | EXTENDED_RELEASE_TABLET | Freq: Two times a day (BID) | ORAL | Status: DC
  Administered 2013-04-02 – 2013-04-04 (×4): 40 meq via ORAL
  Filled 2013-04-02 (×7): qty 2

## 2013-04-02 MED ORDER — FUROSEMIDE 40 MG PO TABS
40.0000 mg | ORAL_TABLET | Freq: Two times a day (BID) | ORAL | Status: DC
  Administered 2013-04-02: 09:00:00 40 mg via ORAL
  Filled 2013-04-02 (×4): qty 1

## 2013-04-02 MED ORDER — ADULT MULTIVITAMIN W/MINERALS CH
1.0000 | ORAL_TABLET | Freq: Every day | ORAL | Status: DC
  Administered 2013-04-03 – 2013-04-11 (×9): 1 via ORAL
  Filled 2013-04-02 (×10): qty 1

## 2013-04-02 MED ORDER — LISINOPRIL 2.5 MG PO TABS
2.5000 mg | ORAL_TABLET | Freq: Every day | ORAL | Status: DC
  Administered 2013-04-04 – 2013-04-05 (×2): 2.5 mg via ORAL
  Filled 2013-04-02 (×4): qty 1

## 2013-04-02 MED ORDER — ENOXAPARIN SODIUM 40 MG/0.4ML ~~LOC~~ SOLN
40.0000 mg | SUBCUTANEOUS | Status: DC
  Administered 2013-04-03 – 2013-04-11 (×9): 40 mg via SUBCUTANEOUS
  Filled 2013-04-02 (×10): qty 0.4

## 2013-04-02 MED ORDER — ALUM & MAG HYDROXIDE-SIMETH 200-200-20 MG/5ML PO SUSP: 30.0000 mL | Freq: Four times a day (QID) | ORAL | Status: DC | PRN

## 2013-04-02 MED ORDER — FLEET ENEMA 7-19 GM/118ML RE ENEM: 1.0000 | ENEMA | Freq: Once | RECTAL | Status: AC | PRN

## 2013-04-02 MED ORDER — BIOTENE DRY MOUTH MT LIQD
15.0000 mL | Freq: Two times a day (BID) | OROMUCOSAL | Status: DC
  Administered 2013-04-02 – 2013-04-11 (×18): 15 mL via OROMUCOSAL

## 2013-04-02 MED ORDER — PROCHLORPERAZINE EDISYLATE 5 MG/ML IJ SOLN
10.0000 mg | Freq: Four times a day (QID) | INTRAMUSCULAR | Status: DC | PRN
  Filled 2013-04-02: qty 2

## 2013-04-02 MED ORDER — CHLORHEXIDINE GLUCONATE 0.12 % MT SOLN
15.0000 mL | Freq: Two times a day (BID) | OROMUCOSAL | Status: DC
Start: 2013-04-02 — End: 2013-04-11
  Administered 2013-04-02 – 2013-04-11 (×18): 15 mL via OROMUCOSAL
  Filled 2013-04-02 (×20): qty 15

## 2013-04-02 MED ORDER — BENEPROTEIN PO POWD
1.0000 | Freq: Three times a day (TID) | ORAL | Status: DC
  Administered 2013-04-02 – 2013-04-11 (×26): 6 g via ORAL
  Filled 2013-04-02: qty 227

## 2013-04-02 MED ORDER — ENSURE PUDDING PO PUDG
1.0000 | Freq: Three times a day (TID) | ORAL | Status: DC
  Administered 2013-04-02 – 2013-04-11 (×26): 1 via ORAL

## 2013-04-02 MED ORDER — CYANOCOBALAMIN 1000 MCG/ML IJ SOLN
1000.0000 ug | INTRAMUSCULAR | Status: DC
  Administered 2013-04-06: 1000 ug via INTRAMUSCULAR
  Filled 2013-04-02: qty 1

## 2013-04-02 MED ORDER — ALPRAZOLAM 0.25 MG PO TABS
1.0000 mg | ORAL_TABLET | Freq: Every evening | ORAL | Status: DC | PRN
  Administered 2013-04-02 – 2013-04-10 (×9): 1 mg via ORAL
  Filled 2013-04-02 (×4): qty 4
  Filled 2013-04-02 (×2): qty 2
  Filled 2013-04-02: qty 4
  Filled 2013-04-02 (×2): qty 2

## 2013-04-02 MED ORDER — METHOCARBAMOL 500 MG PO TABS: 500.0000 mg | ORAL_TABLET | Freq: Four times a day (QID) | ORAL | Status: DC | PRN

## 2013-04-02 MED ORDER — LIP MEDEX EX OINT
1.0000 "application " | TOPICAL_OINTMENT | Freq: Two times a day (BID) | CUTANEOUS | Status: DC
  Filled 2013-04-02: qty 7

## 2013-04-02 MED ORDER — FAMOTIDINE 20 MG PO TABS
20.0000 mg | ORAL_TABLET | Freq: Two times a day (BID) | ORAL | Status: DC
  Administered 2013-04-02 – 2013-04-11 (×18): 20 mg via ORAL
  Filled 2013-04-02 (×20): qty 1

## 2013-04-02 MED ORDER — BISMUTH SUBSALICYLATE 262 MG/15ML PO SUSP
30.0000 mL | Freq: Three times a day (TID) | ORAL | Status: DC | PRN
  Filled 2013-04-02: qty 236

## 2013-04-02 MED ORDER — PHENOL 1.4 % MT LIQD
2.0000 | OROMUCOSAL | Status: DC | PRN
  Filled 2013-04-02: qty 177

## 2013-04-02 MED ORDER — BISACODYL 10 MG RE SUPP: 10.0000 mg | Freq: Every day | RECTAL | Status: DC | PRN

## 2013-04-02 MED ORDER — FERROUS SULFATE 325 (65 FE) MG PO TABS
325.0000 mg | ORAL_TABLET | Freq: Three times a day (TID) | ORAL | Status: DC
  Administered 2013-04-02 – 2013-04-11 (×28): 325 mg via ORAL
  Filled 2013-04-02 (×29): qty 1

## 2013-04-02 MED ORDER — PSYLLIUM 95 % PO PACK
1.0000 | PACK | Freq: Two times a day (BID) | ORAL | Status: DC
  Administered 2013-04-02 – 2013-04-10 (×16): 1 via ORAL
  Filled 2013-04-02 (×20): qty 1

## 2013-04-02 MED ORDER — ASPIRIN 81 MG PO CHEW
81.0000 mg | CHEWABLE_TABLET | Freq: Every day | ORAL | Status: DC
  Administered 2013-04-03 – 2013-04-11 (×9): 81 mg via ORAL
  Filled 2013-04-02 (×9): qty 1

## 2013-04-02 MED ORDER — VITAMIN C 500 MG PO TABS
500.0000 mg | ORAL_TABLET | Freq: Every day | ORAL | Status: DC
  Administered 2013-04-03 – 2013-04-11 (×9): 500 mg via ORAL
  Filled 2013-04-02 (×10): qty 1

## 2013-04-02 MED ORDER — ATORVASTATIN CALCIUM 20 MG PO TABS
20.0000 mg | ORAL_TABLET | Freq: Every day | ORAL | Status: DC
  Administered 2013-04-02 – 2013-04-11 (×10): 20 mg via ORAL
  Filled 2013-04-02 (×10): qty 1

## 2013-04-02 MED ORDER — SACCHAROMYCES BOULARDII 250 MG PO CAPS
250.0000 mg | ORAL_CAPSULE | Freq: Two times a day (BID) | ORAL | Status: DC
  Administered 2013-04-02 – 2013-04-04 (×4): 250 mg via ORAL
  Filled 2013-04-02 (×7): qty 1

## 2013-04-02 MED ORDER — LISINOPRIL 2.5 MG PO TABS
2.5000 mg | ORAL_TABLET | Freq: Every day | ORAL | Status: DC
  Administered 2013-04-02: 10:00:00 2.5 mg via ORAL
  Filled 2013-04-02: qty 1

## 2013-04-02 MED ORDER — LEVOTHYROXINE SODIUM 125 MCG PO TABS
125.0000 ug | ORAL_TABLET | Freq: Every day | ORAL | Status: DC
  Administered 2013-04-03 – 2013-04-11 (×9): 125 ug via ORAL
  Filled 2013-04-02 (×10): qty 1

## 2013-04-02 MED ORDER — GUAIFENESIN-DM 100-10 MG/5ML PO SYRP: 5.0000 mL | ORAL_SOLUTION | Freq: Four times a day (QID) | ORAL | Status: DC | PRN

## 2013-04-02 MED ORDER — AMIODARONE HCL 200 MG PO TABS
200.0000 mg | ORAL_TABLET | Freq: Every day | ORAL | Status: DC
  Administered 2013-04-03 – 2013-04-11 (×9): 200 mg via ORAL
  Filled 2013-04-02 (×10): qty 1

## 2013-04-02 MED ORDER — TRAZODONE HCL 50 MG PO TABS: 25.0000 mg | ORAL_TABLET | Freq: Every evening | ORAL | Status: DC | PRN

## 2013-04-02 MED ORDER — ACETAMINOPHEN 325 MG PO TABS: 325.0000 mg | ORAL_TABLET | ORAL | Status: DC | PRN

## 2013-04-02 MED ORDER — DIPHENHYDRAMINE HCL 12.5 MG/5ML PO ELIX
12.5000 mg | ORAL_SOLUTION | Freq: Four times a day (QID) | ORAL | Status: DC | PRN
  Filled 2013-04-02: qty 10

## 2013-04-02 MED ORDER — ACETAMINOPHEN 325 MG PO TABS: 650.0000 mg | ORAL_TABLET | Freq: Four times a day (QID) | ORAL | Status: DC | PRN

## 2013-04-02 MED ORDER — MYCOPHENOLATE MOFETIL 250 MG PO CAPS
1000.0000 mg | ORAL_CAPSULE | Freq: Two times a day (BID) | ORAL | Status: DC
  Administered 2013-04-02 – 2013-04-11 (×18): 1000 mg via ORAL
  Filled 2013-04-02 (×21): qty 4

## 2013-04-02 MED ORDER — BLISTEX EX OINT
TOPICAL_OINTMENT | Freq: Two times a day (BID) | CUTANEOUS | Status: DC
  Administered 2013-04-02 – 2013-04-04 (×5): via TOPICAL
  Administered 2013-04-05: 1 via TOPICAL
  Administered 2013-04-05 – 2013-04-11 (×11): via TOPICAL
  Filled 2013-04-02 (×2): qty 10

## 2013-04-02 MED ORDER — HEPARIN SOD (PORK) LOCK FLUSH 100 UNIT/ML IV SOLN
500.0000 [IU] | INTRAVENOUS | Status: AC | PRN
  Administered 2013-04-02: 500 [IU]

## 2013-04-02 MED ORDER — MAGIC MOUTHWASH
15.0000 mL | Freq: Four times a day (QID) | ORAL | Status: DC | PRN
  Filled 2013-04-02: qty 15

## 2013-04-02 NOTE — Discharge Summary (Addendum)
Patient transferred to CIR when bed was available prior to daily evaluation or discharge summary being written. Spoke with Dr. Harold Hedge (CIR physician) and he assured me that this was no problem, and if there was any discharge issues he will contact me to resolve them. -Will sign off on patient's care secondary to transfer to CIR

## 2013-04-02 NOTE — Progress Notes (Signed)
Orthopedic Tech Progress Note Patient Details:  [name] 07/09/1924 [id number]  Ortho Devices Type of Ortho Device: Abdominal binder Ortho Device/Splint Interventions: Ordered   Shawnie Pons 04/02/2013, 5:25 PM

## 2013-04-02 NOTE — Progress Notes (Signed)
8 Days Post-Op  Subjective: Pt feels great.  No N/V, minimal abdominal soreness.  Tolerating regular diet.  Ambulating with PT/OT but still feels weak.  Thinks CIR will accept her.  Excited to go to CIR and then home.  Objective: Vital signs in last 24 hours: Temp:  [97.8 F (36.6 C)-98.2 F (36.8 C)] 97.8 F (36.6 C) (11/11 0609) Pulse Rate:  [54-63] 58 (11/11 0609) Resp:  [18-19] 19 (11/11 0609) BP: (98-123)/(48-61) 114/55 mmHg (11/11 0609) SpO2:  [100 %] 100 % (11/11 0609) Weight:  [181 lb 3.5 oz (82.2 kg)] 181 lb 3.5 oz (82.2 kg) (11/11 0609) Last BM Date: 04/01/13  Intake/Output from previous day: 11/10 0701 - 11/11 0700 In: 720 [P.O.:560; I.V.:160] Out: 350 [Urine:350] Intake/Output this shift:    PE: Gen:  Alert, NAD, pleasant Abd: Soft, NT/ND, +BS, no HSM, incisions C/D/I  Lab Results:   Recent Labs  04/01/13 0420 04/02/13 0455  WBC 9.4 9.3  HGB 7.9* 7.5*  HCT 23.7* 23.2*  PLT 119* 131*   BMET  Recent Labs  04/01/13 0420 04/02/13 0455  NA 136 134*  K 4.2 4.4  CL 110 108  CO2 20 20  GLUCOSE 122* 98  BUN 8 7  CREATININE 0.83 0.76  CALCIUM 7.9* 7.8*   PT/INR No results found for this basename: LABPROT, INR,  in the last 72 hours CMP     Component Value Date/Time   NA 134* 04/02/2013 0455   K 4.4 04/02/2013 0455   CL 108 04/02/2013 0455   CO2 20 04/02/2013 0455   GLUCOSE 98 04/02/2013 0455   BUN 7 04/02/2013 0455   CREATININE 0.76 04/02/2013 0455   CALCIUM 7.8* 04/02/2013 0455   PROT 3.8* 04/02/2013 0455   ALBUMIN 1.7* 04/02/2013 0455   AST 13 04/02/2013 0455   ALT <5 04/02/2013 0455   ALKPHOS 43 04/02/2013 0455   BILITOT 0.3 04/02/2013 0455   GFRNONAA 74* 04/02/2013 0455   GFRAA 86* 04/02/2013 0455   Lipase     Component Value Date/Time   LIPASE 15 03/22/2013 1450       Studies/Results: No results found.  Anti-infectives: Anti-infectives   Start     Dose/Rate Route Frequency Ordered Stop   03/26/13 0600  ceFAZolin (ANCEF)  IVPB 2 g/50 mL premix     2 g 100 mL/hr over 30 Minutes Intravenous On call to O.R. 03/25/13 1259 03/25/13 1356   03/26/13 0600  metroNIDAZOLE (FLAGYL) IVPB 500 mg     500 mg 100 mL/hr over 60 Minutes Intravenous On call to O.R. 03/25/13 1259 03/25/13 1412   03/25/13 2200  ceFAZolin (ANCEF) IVPB 2 g/50 mL premix     2 g 100 mL/hr over 30 Minutes Intravenous 3 times per day 03/25/13 1859 03/28/13 1530   03/25/13 2200  metroNIDAZOLE (FLAGYL) IVPB 500 mg     500 mg 100 mL/hr over 60 Minutes Intravenous 3 times per day 03/25/13 1859 03/28/13 1600   03/25/13 1515  clindamycin (CLEOCIN) 900 mg, gentamicin (GARAMYCIN) 240 mg in sodium chloride 0.9 % 1,000 mL for intraperitoneal lavage      Intraperitoneal To Surgery 03/25/13 1507 03/25/13 1720       Assessment/Plan POD #8 s/p laparoscopic ventral hernia repair, laparoscopic chole with IOC, insertion of mesh, LOA, partial omentectomy  1. Tolerating HH diet  2. VTE proph - SCD's, lovenox  3. IS and ambulate OOB - PT/OT working with pt  4. Okay from our standpoint to d/c to CIR/SNF  5.  Pt to f/u with Dr. Michaell Cowing in 2-3 weeks  6. Will sign off, follow up arranged as above  Acute on chronic disease anemia - Hgb stable AFIB, cardiomyopathy, and NSTEMI being managed by cardiology - appreciate their help  Hypokalemia/Hypomag - being managed by primary team  Leukocytosis - resolved  Disp: To SNF/CIR when medically stable for d/c   Pathology report for gallbladder/omentum:  - EXTENSIVELY HYALINIZED AND CALCIFIED GALLBLADDER WITH MIXED ACUTE AND CHRONIC  INFLAMMATION.  - NEGATIVE FOR DYSPLASIA OR MALIGNANCY.  - CHOLELITHIASIS.  - SEPARATELY SUBMITTED BENIGN OMENTAL TISSUE WITH VASCULAR CONGESTION,  HEMORRHAGE, AND INFLAMMATION CONSISTENT WITH A HISTORY OF INCARCERATED HERNIA.     LOS: 11 days    DORT, Clemence Stillings 04/02/2013, 8:38 AM Pager: [phone]

## 2013-04-02 NOTE — Progress Notes (Signed)
    Subjective:  Denies dyspnea or chest pain   . amiodarone  200 mg Oral Daily  . antiseptic oral rinse  15 mL Mouth Rinse q12n4p  . aspirin  81 mg Oral Daily  . atorvastatin  20 mg Oral Q supper  . buPROPion  150 mg Oral Daily  . carvedilol  6.25 mg Oral BID WC  . chlorhexidine  15 mL Mouth Rinse BID  . [START ON 04/06/2013] cyanocobalamin  1,000 mcg Intramuscular Q30 days  . enoxaparin (LOVENOX) injection  40 mg Subcutaneous Q24H  . famotidine  20 mg Oral BID  . feeding supplement (ENSURE)  1 Container Oral TID BM  . ferrous sulfate  325 mg Oral TID WC  . furosemide  40 mg Intravenous BID  . furosemide  40 mg Intravenous Once  . levothyroxine  125 mcg Oral QAC breakfast  . lip balm  1 application Topical BID  . multivitamin with minerals  1 tablet Oral Daily  . mycophenolate  1,000 mg Oral BID  . potassium chloride  40 mEq Oral BID  . psyllium  1 packet Oral BID  . saccharomyces boulardii  250 mg Oral BID  . sodium chloride  3 mL Intravenous Q12H  . vitamin C  500 mg Oral Daily   Objective:  Vital Signs in the last 24 hours: Temp:  [97.8 F (36.6 C)-98.2 F (36.8 C)] 98.2 F (36.8 C) (11/10 2049) Pulse Rate:  [54-63] 58 (11/10 2049) Resp:  [18] 18 (11/10 2049) BP: (98-123)/(48-61) 123/61 mmHg (11/10 2049) SpO2:  [100 %] 100 % (11/10 2049)  Intake/Output from previous day: 11/10 0701 - 11/11 0700 In: 720 [P.O.:560; I.V.:160] Out: 350 [Urine:350]  Physical Exam: Pt is alert and oriented, elderly woman in NAD HEENT: normal Neck: supple Lungs: CTA CV: RRR  Abd: soft, NT, Positive BS Ext: 1-2+ edema Skin: warm/dry no rash  Lab Results:  Recent Labs  03/31/13 0400 04/01/13 0420  WBC 8.0 9.4  HGB 7.3* 7.9*  PLT 109* 119*    Recent Labs  03/31/13 0400 04/01/13 0420  NA 137 136  K 3.4* 4.2  CL 112 110  CO2 18* 20  GLUCOSE 121* 122*  BUN 11 8  CREATININE 0.97 0.83    Tele: Sinus rhythm   Assessment/Plan:   1. Atrial fib, stable in sinus  rhythm on oral amiodarone. Patient apparently had atrial fibrillation in 2001. Multiple embolic risk factors; continue ASA for now. Consider NOAC as outpatient once she recovers from recent illness and surgery. FU Dr Clifton James 2-4 weeks after DC.  2. Cardiomyopathy - suspect stress-induced based on periapical pattern, Continue carvedilol; add lisinopril 2.5 mg daily and follow BP/renal function. Repeat echo about 6 weeks following DC to reassess LV function. If normalized, continue medical therapy. If LV function continues to be decreased, will plan myoview to excude ischemia.  3. NSTEMI - conservative Rx for now.  4. Volume overload, post-op - change lasix to 40 po BID and follow renal function. Some of volume excess most likely related to low albumin.    Olga Millers, M.D. 04/02/2013, 5:49 AM

## 2013-04-02 NOTE — Progress Notes (Signed)
Rehab admissions - Evaluated for possible admission.  I spoke with patient's daughter today.  Patient and her daughter would like inpatient rehab stay.  Bed available today and can admit today if MD feels patient is medically ready.  Call me for questions.  #[phone]

## 2013-04-02 NOTE — Progress Notes (Signed)
Report called to RN at [facility] and [facility] notified.

## 2013-04-02 NOTE — H&P (Signed)
Physical Medicine and Rehabilitation Admission H&P  Chief Complaint   Patient presents with   .  Deconditioning due to multiple medical issues.   HPI: [name] is a 79 y.o. female with history of PAF, CMV colitis with chronic diarrhea/minimal nausea. Admitted on 03/22/13 with poor po intake, bilious vomiting and inability to keep anything now. She was found to have SBO with incarcerated umbilical hernia and NGT placed per [facility] input. Cardiology consulted for management of A Fib with NSTEMI due to demand ischemia. 2D echo done revealing decrease in EF to 30-35% with question of Takasubo CM due to rapid A fib. She was started on amiodarone as well and is not a candidate for anticoagulation. On 11/03/1, she underwent Lap ventral hernia repair with mesh, lysis of adhesions and lap chole with partial omentectomy by Dr. Michaell Cowing. Diarrhea treated with rectal tube and slowly improving.-- c-diff check negative and stools bulking up. Post op fluid overload treated with diuretics. ABLA being monitored--hgb has been variable in 7.3- 7.9 range. Cardiology recommends follow up in 2 weeks past discharge for discussion regarding NOAC due to multiple embolic risk factors as well as follow up echo. Therapies initiated and family requesting CIR (CIR 9/15-9/23 for deconditioning.)    Review of Systems  Gastrointestinal: Positive for nausea, abdominal pain and diarrhea---stool now soft  Musculoskeletal: Positive for back pain.  Skin:  Breakdown on left buttock reported  Neurological: Positive for weakness.   Past Medical History   Diagnosis  Date   .  Anemia    .  Hyperlipemia    .  Hypertension    .  Thyroid disease      hypothyroidism   .  Anxiety    .  Paroxysmal atrial fibrillation    .  Vitamin B 12 deficiency    .  CHF (congestive heart failure)    .  TIA (transient ischemic attack)      4-09   .  Microscopic hematuria      benign microscopic hematuria, worked up b Dr Alexis Frock   .  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  Osteoporosis      DEXA on 04-30-10   .  Myasthenia gravis      sees Dr. Terrace Arabia   .  Ulcerative colitis    .  DVT of leg (deep venous thrombosis)      left leg   .  CMV colitis     Past Surgical History   Procedure  Laterality  Date   .  Cardiac catheterization       04/1999   .  Abdominal hysterectomy       with oophorectomy   .  Colonoscopy   1999     normal   .  Colonoscopy  N/A  12/10/2012     Procedure: COLONOSCOPY; Surgeon: Iva Boop, MD; Location: [facility]; Service: Endoscopy; Laterality: N/A;   .  Flexible sigmoidoscopy  N/A  01/26/2013     Procedure: FLEXIBLE SIGMOIDOSCOPY; Surgeon: Iva Boop, MD; Location: [facility]; Service: Endoscopy; Laterality: N/A;   .  Ventral hernia repair  N/A  03/25/2013     Procedure: LAPAROSCOPIC VENTRAL HERNIA REPAIR ; Surgeon: Ardeth Sportsman, MD; Location: [facility]; Service: General; Laterality: N/A;   .  Cholecystectomy  N/A  03/25/2013     Procedure: LAPAROSCOPIC CHOLECYSTECTOMY WITH INTRAOPERATIVE CHOLANGIOGRAM; Surgeon: Ardeth Sportsman, MD;  Location: [facility]; Service: General; Laterality: N/A;   .  Insertion of mesh  N/A  03/25/2013     Procedure: INSERTION OF MESH; Surgeon: Ardeth Sportsman, MD; Location: [facility]; Service: General; Laterality: N/A;   .  Laparoscopic lysis of adhesions  N/A  03/25/2013     Procedure: LAPAROSCOPIC LYSIS OF ADHESIONS; Surgeon: Ardeth Sportsman, MD; Location: [facility]; Service: General; Laterality: N/A;   .  Omentectomy  N/A  03/25/2013     Procedure: PARTIAL OMENTECTOMY; Surgeon: Ardeth Sportsman, MD; Location: [facility]; Service: General; Laterality: N/A;    Family History   Problem  Relation  Age of Onset   .  Heart disease  Father     Social History: Lives with family--independent but sendentary PTA. reports that she has never smoked. She has never used smokeless tobacco. She reports that she does not drink alcohol or use  illicit drugs  Allergies:  Allergies   Allergen  Reactions   .  Zofran [Ondansetron Hcl]      hypotension   .  Furosemide  Rash     REACTION: unspecified   .  Ivp Dye [Iodinated Diagnostic Agents]  Rash   .  Sulfamethoxazole  Rash     REACTION: unspecified    Medications Prior to Admission   Medication  Sig  Dispense  Refill   .  ALPRAZolam (XANAX) 0.25 MG tablet  Take 1 tablet (0.25 mg total) by mouth 2 (two) times daily as needed for anxiety.  30 tablet  0   .  ALPRAZolam (XANAX) 1 MG tablet  Take 1 mg by mouth at bedtime as needed for sleep.     Marland Kitchen  atorvastatin (LIPITOR) 20 MG tablet  Take 1 tablet (20 mg total) by mouth daily with supper.  30 tablet  1   .  buPROPion (WELLBUTRIN XL) 150 MG 24 hr tablet  Take 1 tablet (150 mg total) by mouth every morning.  30 tablet  11   .  co-enzyme Q-10 50 MG capsule  Take 1 capsule (50 mg total) by mouth at bedtime.  30 capsule  1   .  cyanocobalamin (,VITAMIN B-12,) 1000 MCG/ML injection  Inject 1,000 mcg into the muscle every 30 (thirty) days.     .  feeding supplement (ENSURE COMPLETE) LIQD  Take 237 mLs by mouth daily.  30 Bottle  0   .  fish oil-omega-3 fatty acids 1000 MG capsule  Take 1 capsule (1 g total) by mouth daily.  30 capsule  1   .  glucosamine-chondroitin 500-400 MG tablet  Take 1-2 tablets by mouth daily. Alternates taking 1 and 2 capsules.     .  irbesartan-hydrochlorothiazide (AVALIDE) 300-12.5 MG per tablet  Take 1 tablet by mouth daily.  30 tablet  11   .  levothyroxine (SYNTHROID, LEVOTHROID) 125 MCG tablet  Take 1 tablet (125 mcg total) by mouth daily.  30 tablet  11   .  loperamide (IMODIUM) 2 MG capsule  Take 1 capsule (2 mg total) by mouth 3 (three) times daily.  90 capsule  0   .  Multiple Vitamin (MULTIVITAMIN WITH MINERALS) TABS tablet  Take 1 tablet by mouth daily.     .  mycophenolate (CELLCEPT) 500 MG tablet  Take 2 tablets (1,000 mg total) by mouth 2 (two) times daily.  60 tablet  1   .  potassium chloride (K-DUR)  10 MEQ tablet  Take 20 mEq by mouth 3 (three) times  daily.     .  Probiotic Product (PROBIOTIC DAILY PO)  Take 1 tablet by mouth daily.     .  ranitidine (ZANTAC) 150 MG tablet  Take 150 mg by mouth daily.     Marland Kitchen  torsemide (DEMADEX) 10 MG tablet  Take 10 mg by mouth 2 (two) times daily with a meal.     .  vitamin C (ASCORBIC ACID) 250 MG tablet  Take 250 mg by mouth daily.      Home:  Home Living  Family/patient expects to be discharged to:: Private residence  Living Arrangements: Children  Available Help at Discharge: Family;Available 24 hours/day (son-in-law home 24/7)  Type of Home: House  Home Access: Level entry  Home Layout: Two level;Able to live on main level with bedroom/bathroom  Home Equipment: Walker - 4 wheels;Bedside commode;Grab bars - toilet;Toilet riser  Additional Comments: has built in seat in shower, grab bars around toilet, uses 4 wheeled walker  Functional History:  Prior Function  Comments: daughter washed hair  Functional Status:  Mobility:  Bed Mobility  Bed Mobility: Sit to Supine  Rolling Right: 4: Min assist  Right Sidelying to Sit: 3: Mod assist  Sitting - Scoot to Edge of Bed: 4: Min assist  Sit to Supine: 3: Mod assist  Sit to Supine: Patient Percentage: 60%  Transfers  Transfers: Stand Pivot Transfers  Sit to Stand: 1: +2 Total assist;From chair/3-in-1  Sit to Stand: Patient Percentage: 60%  Stand to Sit: 1: +2 Total assist;To bed  Stand to Sit: Patient Percentage: 60%  Stand Pivot Transfers: 1: +2 Total assist  Stand Pivot Transfers: Patient Percentage: 60%  Ambulation/Gait  Ambulation/Gait Assistance: Not tested (comment)  Ambulation/Gait Assistance Details: NT 2* fatigue and pt reported some dizziness while sitting on BSC   ADL:  ADL  Grooming: Set up  Where Assessed - Grooming: Supported sitting  Upper Body Bathing: Minimal assistance (manage lines)  Where Assessed - Upper Body Bathing: Supported sitting  Lower Body Bathing: +2 Total  assistance  Where Assessed - Lower Body Bathing: Supported sit to stand  Upper Body Dressing: Moderate assistance (multiple lines)  Where Assessed - Upper Body Dressing: Supported sit to stand  Lower Body Dressing: +2 Total assistance  Where Assessed - Lower Body Dressing: Supported sit to stand  Toilet Transfer: Performed;+2 Total assistance (mod A x 1 on way over: see transfers below)  Toilet Transfer Method: Stand pivot  Acupuncturist: (chair to bed)  Transfers/Ambulation Related to ADLs: pt initially performed spt with mod A to 3:1. Attempted to stand twice, could not stand erect, for hygiene. Got help for SPT to chair as pt was much weaker. BP had dropped 35 points systolic.  ADL Comments: pt very motivated; fatiques easily. Today HgB 7.9. Decreased BP  Cognition:  Cognition  Overall Cognitive Status: Within Functional Limits for tasks assessed  Orientation Level: Oriented X4  Cognition  Arousal/Alertness: Awake/alert  Behavior During Therapy: WFL for tasks assessed/performed  Overall Cognitive Status: Within Functional Limits for tasks assessed  Physical Exam:  Blood pressure 114/55, pulse 58, temperature 97.8 F (36.6 C), temperature source Oral, resp. rate 19, height 5\' 4"  (1.626 m), weight 82.2 kg (181 lb 3.5 oz), SpO2 100.00%.    Constitutional: She appears well-developed and well-nourished. No distress HENT: oral mucosa pink and moist, dentition fair/good Head: Normocephalic.  Eyes: EOM are normal. Pupils are equal, round, and reactive to light.  Neck: Normal range of motion. Neck supple. No JVD present.  No tracheal deviation present. No thyromegaly present.  Cardiovascular: Normal rate.  No murmur heard. No rubs, gallops Respiratory: Effort normal. No respiratory distress. No wheezes GI: She exhibits no distension. There is tenderness. Small incision sites well healed over abdomen. BS+ Musculoskeletal:  Edema 1+ in LE's, tr to 1+ in UE's--unchanged. Homan's -,  left knee a little tender with ROM. Neurological: She is alert. No cranial nerve deficit. Coordination normal.  UE's 4/5. LE's 3- HF, 3/5 KE,  4/5 distally at feet. No gross sensory deficits.  Psychiatric: She has a normal mood and affect. Her behavior is normal. Judgment and thought content normal.  Skin: mild gluteal breakdown noted.   Results for orders placed during the hospital encounter of 03/22/13 (from the past 48 hour(s))   CBC WITH DIFFERENTIAL Status: Abnormal    Collection Time    04/01/13 4:20 AM   Result  Value  Range    WBC  9.4  4.0 - 10.5 K/uL    RBC  2.54 (*)  3.87 - 5.11 MIL/uL    Hemoglobin  7.9 (*)  12.0 - 15.0 g/dL    HCT  40.9 (*)  81.1 - 46.0 %    MCV  93.3  78.0 - 100.0 fL    MCH  31.1  26.0 - 34.0 pg    MCHC  33.3  30.0 - 36.0 g/dL    RDW  91.4 (*)  78.2 - 15.5 %    Platelets  119 (*)  150 - 400 K/uL    Comment:  PLATELET COUNT CONFIRMED BY SMEAR    Neutrophils Relative %  82 (*)  43 - 77 %    Lymphocytes Relative  8 (*)  12 - 46 %    Monocytes Relative  8  3 - 12 %    Eosinophils Relative  2  0 - 5 %    Basophils Relative  0  0 - 1 %    Neutro Abs  7.6  1.7 - 7.7 K/uL    Lymphs Abs  0.8  0.7 - 4.0 K/uL    Monocytes Absolute  0.8  0.1 - 1.0 K/uL    Eosinophils Absolute  0.2  0.0 - 0.7 K/uL    Basophils Absolute  0.0  0.0 - 0.1 K/uL   BASIC METABOLIC PANEL Status: Abnormal    Collection Time    04/01/13 4:20 AM   Result  Value  Range    Sodium  136  135 - 145 mEq/L    Potassium  4.2  3.5 - 5.1 mEq/L    Comment:  DELTA CHECK NOTED     REPEATED TO VERIFY     NO VISIBLE HEMOLYSIS    Chloride  110  96 - 112 mEq/L    CO2  20  19 - 32 mEq/L    Glucose, Bld  122 (*)  70 - 99 mg/dL    BUN  8  6 - 23 mg/dL    Creatinine, Ser  9.56  0.50 - 1.10 mg/dL    Calcium  7.9 (*)  8.4 - 10.5 mg/dL    GFR calc non Af Amer  62 (*)  >90 mL/min    GFR calc Af Amer  72 (*)  >90 mL/min    Comment:  (NOTE)     The eGFR has been calculated using the CKD EPI equation.      This calculation has not been validated in all clinical situations.     eGFR's persistently <  90 mL/min signify possible Chronic Kidney     Disease.   MAGNESIUM Status: None    Collection Time    04/01/13 4:20 AM   Result  Value  Range    Magnesium  2.1  1.5 - 2.5 mg/dL   CBC WITH DIFFERENTIAL Status: Abnormal    Collection Time    04/02/13 4:55 AM   Result  Value  Range    WBC  9.3  4.0 - 10.5 K/uL    RBC  2.44 (*)  3.87 - 5.11 MIL/uL    Hemoglobin  7.5 (*)  12.0 - 15.0 g/dL    HCT  16.1 (*)  09.6 - 46.0 %    MCV  95.1  78.0 - 100.0 fL    MCH  30.7  26.0 - 34.0 pg    MCHC  32.3  30.0 - 36.0 g/dL    RDW  04.5 (*)  40.9 - 15.5 %    Platelets  131 (*)  150 - 400 K/uL    Neutrophils Relative %  82 (*)  43 - 77 %    Neutro Abs  7.6  1.7 - 7.7 K/uL    Lymphocytes Relative  9 (*)  12 - 46 %    Lymphs Abs  0.8  0.7 - 4.0 K/uL    Monocytes Relative  8  3 - 12 %    Monocytes Absolute  0.8  0.1 - 1.0 K/uL    Eosinophils Relative  2  0 - 5 %    Eosinophils Absolute  0.2  0.0 - 0.7 K/uL    Basophils Relative  0  0 - 1 %    Basophils Absolute  0.0  0.0 - 0.1 K/uL   MAGNESIUM Status: None    Collection Time    04/02/13 4:55 AM   Result  Value  Range    Magnesium  2.0  1.5 - 2.5 mg/dL   COMPREHENSIVE METABOLIC PANEL Status: Abnormal    Collection Time    04/02/13 4:55 AM   Result  Value  Range    Sodium  134 (*)  135 - 145 mEq/L    Potassium  4.4  3.5 - 5.1 mEq/L    Chloride  108  96 - 112 mEq/L    CO2  20  19 - 32 mEq/L    Glucose, Bld  98  70 - 99 mg/dL    BUN  7  6 - 23 mg/dL    Creatinine, Ser  8.11  0.50 - 1.10 mg/dL    Calcium  7.8 (*)  8.4 - 10.5 mg/dL    Total Protein  3.8 (*)  6.0 - 8.3 g/dL    Albumin  1.7 (*)  3.5 - 5.2 g/dL    AST  13  0 - 37 U/L    ALT  <5  0 - 35 U/L    Comment:  REPEATED TO VERIFY    Alkaline Phosphatase  43  39 - 117 U/L    Total Bilirubin  0.3  0.3 - 1.2 mg/dL    GFR calc non Af Amer  74 (*)  >90 mL/min    GFR calc Af Amer  86 (*)  >90  mL/min    Comment:  (NOTE)     The eGFR has been calculated using the CKD EPI equation.     This calculation has not been validated in all clinical situations.     eGFR's persistently <90 mL/min signify possible Chronic  Kidney     Disease.    No results found.  Post Admission Physician Evaluation:  1. Functional deficits secondary to deconditioning related to incarcerated small bowl s/p chole and lysis of adhesions. 2. Patient is admitted to receive collaborative, interdisciplinary care between the physiatrist, rehab nursing staff, and therapy team. 3. Patient's level of medical complexity and substantial therapy needs in context of that medical necessity cannot be provided at a lesser intensity of care such as a SNF. 4. Patient has experienced substantial functional loss from his/her baseline which was documented above under the "Functional History" and "Functional Status" headings. Judging by the patient's diagnosis, physical exam, and functional history, the patient has potential for functional progress which will result in measurable gains while on inpatient rehab. These gains will be of substantial and practical use upon discharge in facilitating mobility and self-care at the household level. 5. Physiatrist will provide 24 hour management of medical needs as well as oversight of the therapy plan/treatment and provide guidance as appropriate regarding the interaction of the two. 6. 24 hour rehab nursing will assist with bladder management, bowel management, safety, skin/wound care, disease management, medication administration, pain management and patient education and help integrate therapy concepts, techniques,education, etc. 7. PT will assess and treat for/with: Lower extremity strength, range of motion, stamina, balance, functional mobility, safety, adaptive techniques and equipment, pain mgt. Goals are: supervision to min assist. 8. OT will assess and treat for/with: ADL's, functional  mobility, safety, upper extremity strength, adaptive techniques and equipment, pain mgt. Goals are: supervision to minimal assist. 9. SLP will assess and treat for/with: n/a. Goals are: n/a. 10. Case Management and Social Worker will assess and treat for psychological issues and discharge planning. 11. Team conference will be held weekly to assess progress toward goals and to determine barriers to discharge. 12. Patient will receive at least 3 hours of therapy per day at least 5 days per week. 13. ELOS: 12-16 days  14. Prognosis: excellent. She is quite motivated.    Medical Problem List and Plan:  Deconditioning due to incarcerated hernia with SBO, cholelithiasis with acute and chronically inflamed GB--requiring L, 1. DVT Prophylaxis/Anticoagulation: Pharmaceutical: Lovenox  2. Pain Management: tylenol for now. No substantial pain at present. Local remedies if possible also  3. Mood: Continue bupropion daily. Motivated to get better--LCSW to follow for evaluation.  4. Neuropsych: This patient is capable of making decisions on her own behalf.  5. 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 Monitor lytes with routine check. Continue probiotics and mycophenolate. This is showing signs of improvement  -low fiber diet 6. NSTEMI due to demand ischemia:  7. ABLA: Will continue to monitor H/H. Transfuse if below 7.0 or gets symptomatic with increase in activity level on rehab. Continue iron supplement.  8. A Fib: In NSR on amiodarone and coreg. Continue to monitor heart rate on bid basis.  9. Cardiomyopathy: Check weights daily. Low salt diet to avoid recurrent volume overload. On coreg, Lipitor and lisinopril. orthostasis reported with activity. Will check orthostatic BP and adjust medications as indicated.    Ranelle Oyster, MD, [facility] Physical Medicine & Rehabilitation   04/02/2013

## 2013-04-02 NOTE — Progress Notes (Signed)
OT Cancellation Note  Patient Details Name: [name] MRN: [id number] DOB: 05-14-1925   Cancelled Treatment:     Reviewed chart:  Pt plans d/c to CIR.  If she remains here, we will check back.  Averyana Pillars 04/02/2013, 12:59 PM Marica Otter, OTR/L [phone] 04/02/2013

## 2013-04-02 NOTE — Progress Notes (Signed)
Admit to unit, reviewed med orders and oriented to unit. REviewed therapy schedule and rehab routine. No questions noted. Call button within reach and SRx3. Pamelia Hoit

## 2013-04-02 NOTE — PMR Pre-admission (Signed)
PMR Admission Coordinator Pre-Admission Assessment  Patient: [name] is an 79 y.o., female MRN: [id number] DOB: 07/25/1924 Height: 5\' 4"  (162.6 cm) Weight: 82.2 kg (181 lb 3.5 oz)              Insurance Information HMO:      PPO:       PCP:       IPA:       80/20:       OTHER:   PRIMARY: Medicare A/B      Policy#: [id number]      Subscriber: [name] CM Name:        Phone#:       Fax#:   Pre-Cert#:        Employer:  Retired Benefits:  Phone #:       Name: Armed forces technical officer. Date: A=05/24/91 B=10/22/91     Deduct: $1216      Out of Pocket Max: none      Life Max: Unlimited CIR: 100%      SNF: 100 days      LBD=02/12/13    Outpatient: 80%     Co-Pay: 20% Home Health: 100%      Co-Pay: none DME: 80%     Co-Pay: 20% Providers: patient's choice  SECONDARY: AARP      Policy#: [id number]      Subscriber: [name] CM Name:        Phone#:       Fax#:   Pre-Cert#:        Employer: Retired Benefits:  Phone #:  [phone]     Name:   Eff. Date:       Deduct:        Out of Pocket Max:        Life Max:   CIR:        SNF:   Outpatient:       Co-Pay:   Home Health:        Co-Pay:   DME:       Co-Pay:    Emergency Contact Information Contact Information   Name Relation Home Work [place] Daughter [phone]  [phone]   [facility] Daughter [phone]     Current Medical History  Patient Admitting Diagnosis: Deconditioning related to incarcerated small bowel s/p chole and lysis of adhesions   History of Present Illness:  An 79 y.o. female with history of PAF, CMV colitis with chronic diarrhea/minimal nausea. Admitted on 03/22/13 with poor po intake, bilious vomiting and inability to keep anything now. She was found to have SBO with incarcerated umbilical hernia and NGT placed per [facility] input. Cardiology consulted for management of A Fib with NSTEMI due to demand ischemia. 2D echo done revealing decrease in EF to 30-35% with  question of Takasubo CM due to rapid A fib. She was started on amiodarone as well and is not a candidate for anticoagulation. On 11/03/1, she underwent Lap ventral hernia repair with mesh, lysis of adhesions and lap chole with partial omentectomy by Dr. Michaell Cowing. Diarrhea treated with rectal tube and slowly improving.-- c-diff check negative and stools bulking up. Post op fluid overload treated with diuretics. ABLA being monitored--hgb has been variable in 7.3- 7.9 range. Cardiology recommends follow up in 2 weeks post discharge for discussion regarding NOAC due to multiple embolic risk factors as well as follow up echo. Therapies initiated and family requesting CIR (CIR 9/15-9/23 for deconditioning.)   Past Medical History  Past Medical History  Diagnosis Date  . Anemia   . Hyperlipemia   . Hypertension   . Thyroid disease     hypothyroidism  . Anxiety   . Paroxysmal atrial fibrillation   . Vitamin B 12 deficiency   . CHF (congestive heart failure)   . TIA (transient ischemic attack)     4-09  . Microscopic hematuria     benign microscopic hematuria, worked up b Dr Alexis Frock  . Gall stone     porcelain gall bladder with single stone  per CT 02-2009 Dr Birdie Sons  . Osteoporosis     DEXA on 04-30-10  . Myasthenia gravis     sees Dr. Terrace Arabia   . Ulcerative colitis   . DVT of leg (deep venous thrombosis)     left leg   . CMV colitis     Family History  family history includes Heart disease in her father.  Prior Rehab/Hospitalizations: Was in CIR 09/15 to 02/12/13.  Had [facility] 3 X week PT and RN  Current Medications  Current facility-administered medications:0.9 %  sodium chloride infusion, , Intravenous, Continuous, Megan Dort, PA-C, Last Rate: 20 mL/hr at 04/01/13 0800;  acetaminophen (TYLENOL) tablet 650 mg, 650 mg, Oral, Q6H PRN, Violet Baldy Reidler, PA-C;  ALPRAZolam Prudy Feeler) tablet 0.25 mg, 0.25 mg, Oral, BID PRN, Ardeth Sportsman, MD, 0.25 mg at 03/28/13 1809;  ALPRAZolam Prudy Feeler)  tablet 1 mg, 1 mg, Oral, QHS PRN, Ardeth Sportsman, MD, 1 mg at 04/01/13 2313 alum & mag hydroxide-simeth (MAALOX/MYLANTA) 200-200-20 MG/5ML suspension 30 mL, 30 mL, Oral, Q6H PRN, Ardeth Sportsman, MD;  amiodarone (PACERONE) tablet 200 mg, 200 mg, Oral, Daily, Lewayne Bunting, MD, 200 mg at 04/02/13 1015;  antiseptic oral rinse (BIOTENE) solution 15 mL, 15 mL, Mouth Rinse, q12n4p, Alison Murray, MD, 15 mL at 04/01/13 1552;  aspirin chewable tablet 81 mg, 81 mg, Oral, Daily, Tonny Bollman, MD, 81 mg at 04/02/13 1016 atorvastatin (LIPITOR) tablet 20 mg, 20 mg, Oral, Q supper, Ardeth Sportsman, MD, 20 mg at 04/01/13 1814;  bismuth subsalicylate (PEPTO BISMOL) 262 MG/15ML suspension 30 mL, 30 mL, Oral, Q8H PRN, Ardeth Sportsman, MD;  buPROPion (WELLBUTRIN XL) 24 hr tablet 150 mg, 150 mg, Oral, Daily, Ardeth Sportsman, MD, 150 mg at 04/02/13 1015;  carvedilol (COREG) tablet 6.25 mg, 6.25 mg, Oral, BID WC, Lewayne Bunting, MD, 6.25 mg at 04/02/13 0839 chlorhexidine (PERIDEX) 0.12 % solution 15 mL, 15 mL, Mouth Rinse, BID, Alison Murray, MD, 15 mL at 04/02/13 0800;  [START ON 04/06/2013] cyanocobalamin ((VITAMIN B-12)) injection 1,000 mcg, 1,000 mcg, Intramuscular, Q30 days, Jared M Gardner, DO;  enoxaparin (LOVENOX) injection 40 mg, 40 mg, Subcutaneous, Q24H, Megan Dort, PA-C, 40 mg at 04/02/13 1019;  famotidine (PEPCID) tablet 20 mg, 20 mg, Oral, BID, Ardeth Sportsman, MD, 20 mg at 04/02/13 1015 feeding supplement (ENSURE) (ENSURE) pudding 1 Container, 1 Container, Oral, TID BM, Ardeth Sportsman, MD, 1 Container at 04/02/13 1000;  fentaNYL (SUBLIMAZE) injection 25-50 mcg, 25-50 mcg, Intravenous, Q1H PRN, Ardeth Sportsman, MD, 25 mcg at 03/29/13 0931;  ferrous sulfate tablet 325 mg, 325 mg, Oral, TID WC, Ardeth Sportsman, MD, 325 mg at 04/02/13 0839;  furosemide (LASIX) injection 40 mg, 40 mg, Intravenous, Once, Drema Dallas, MD furosemide (LASIX) tablet 40 mg, 40 mg, Oral, BID, Lewayne Bunting, MD, 40 mg at 04/02/13  1610;  levothyroxine (SYNTHROID, LEVOTHROID) tablet 125 mcg, 125 mcg, Oral, QAC breakfast, Ardeth Sportsman,  MD, 125 mcg at 04/02/13 0839;  lip balm (CARMEX) ointment 1 application, 1 application, Topical, BID, Ardeth Sportsman, MD, 1 application at 04/02/13 1000 lisinopril (PRINIVIL,ZESTRIL) tablet 2.5 mg, 2.5 mg, Oral, Daily, Lewayne Bunting, MD, 2.5 mg at 04/02/13 1016;  magic mouthwash, 15 mL, Oral, QID PRN, Ardeth Sportsman, MD, 15 mL at 04/02/13 1610;  multivitamin with minerals tablet 1 tablet, 1 tablet, Oral, Daily, Hillary Bow, DO, 1 tablet at 04/02/13 1016;  mycophenolate (CELLCEPT) capsule 1,000 mg, 1,000 mg, Oral, BID, Jared M Gardner, DO, 1,000 mg at 04/02/13 1016 phenol (CHLORASEPTIC) mouth spray 2 spray, 2 spray, Mouth/Throat, PRN, Ardeth Sportsman, MD;  potassium chloride SA (K-DUR,KLOR-CON) CR tablet 40 mEq, 40 mEq, Oral, BID, Ardeth Sportsman, MD, 40 mEq at 04/02/13 1015;  prochlorperazine (COMPAZINE) injection 10 mg, 10 mg, Intravenous, Q6H PRN, Alison Murray, MD, 10 mg at 03/28/13 0018;  psyllium (HYDROCIL/METAMUCIL) packet 1 packet, 1 packet, Oral, BID, Ardeth Sportsman, MD, 1 packet at 04/02/13 1015 saccharomyces boulardii (FLORASTOR) capsule 250 mg, 250 mg, Oral, BID, Ardeth Sportsman, MD, 250 mg at 04/02/13 1015;  sodium chloride 0.9 % injection 10-40 mL, 10-40 mL, Intracatheter, PRN, Dorothea Ogle, MD, 10 mL at 04/02/13 0458;  sodium chloride 0.9 % injection 3 mL, 3 mL, Intravenous, Q12H, Jared M Gardner, DO, 3 mL at 04/02/13 1000;  vitamin C (ASCORBIC ACID) tablet 500 mg, 500 mg, Oral, Daily, Ardeth Sportsman, MD, 500 mg at 04/02/13 1016  Patients Current Diet: Cardiac  Precautions / Restrictions Precautions Precautions: Fall Precaution Comments: monitor BP, pt orthostatic on 03/29/13 Restrictions Weight Bearing Restrictions: No   Prior Activity Level Limited Community (1-2x/wk): Goes out 1-2 X a week.  Home Assistive Devices / Equipment Home Assistive Devices/Equipment:  Eyeglasses;Walker (specify type);Dentures (specify type) Home Equipment: Walker - 4 wheels;Bedside commode;Grab bars - toilet;Toilet riser  Prior Functional Level Prior Function Level of Independence: Independent with assistive device(s) Comments: daughter washed hair  Current Functional Level Cognition  Overall Cognitive Status: Within Functional Limits for tasks assessed Orientation Level: Oriented X4    Extremity Assessment (includes Sensation/Coordination)          ADLs  Grooming: Set up Where Assessed - Grooming: Supported sitting Upper Body Bathing: Minimal assistance (manage lines) Where Assessed - Upper Body Bathing: Supported sitting Lower Body Bathing: +2 Total assistance Lower Body Bathing: Patient Percentage: 20% Where Assessed - Lower Body Bathing: Supported sit to stand Upper Body Dressing: Moderate assistance (multiple lines) Where Assessed - Upper Body Dressing: Supported sit to stand Lower Body Dressing: +2 Total assistance Lower Body Dressing: Patient Percentage: 0% Where Assessed - Lower Body Dressing: Supported sit to stand Toilet Transfer: Performed;+2 Total assistance (mod A x 1 on way over:  see transfers below) Toilet Transfer: Patient Percentage: 40% Toilet Transfer Method: Surveyor, minerals:  (chair to bed) Toileting - Architect and Hygiene: +2 Total assistance Toileting - Clothing Manipulation and Hygiene: Patient Percentage: 0% Where Assessed - Toileting Clothing Manipulation and Hygiene: Standing Transfers/Ambulation Related to ADLs: pt initially performed spt with mod A to 3:1.  Attempted to stand twice, could not stand erect, for hygiene.  Got help for SPT to chair as pt was much weaker.  BP had dropped 35 points systolic. ADL Comments: pt very motivated; fatiques easily.  Today HgB 7.9.  Decreased BP    Mobility  Bed Mobility: Sit to Supine Rolling Right: 4: Min assist Right Sidelying to Sit: 3: Mod  assist Sitting - Scoot to Edge of Bed: 4: Min assist Sit to Supine: 3: Mod assist Sit to Supine: Patient Percentage: 60%    Transfers  Transfers: Stand Pivot Transfers Sit to Stand: 1: +2 Total assist;From chair/3-in-1 Sit to Stand: Patient Percentage: 60% Stand to Sit: 1: +2 Total assist;To bed Stand to Sit: Patient Percentage: 60% Stand Pivot Transfers: 1: +2 Total assist Stand Pivot Transfers: Patient Percentage: 60%    Ambulation / Gait / Stairs / Wheelchair Mobility  Ambulation/Gait Ambulation/Gait Assistance: Not tested (comment) Ambulation/Gait Assistance Details: NT 2* fatigue and pt reported some dizziness while sitting on BSC    Posture / Balance      Special needs/care consideration BiPAP/CPAP No CPM No Continuous Drip IV No  Dialysis No         Life Vest No Oxygen No Special Bed No Trach Size No Wound Vac (area) No      Skin Has dry, scaley skin.  Has pressure ulcers lower back and buttocks                              Bowel mgmt: Had BM 04/01/13, having trouble holding stool Bladder mgmt: Using bedpan on acute Diabetic mgmt No    Previous Home Environment Living Arrangements: Children Available Help at Discharge: Family;Available 24 hours/day (son-in-law home 24/7) Type of Home: House Home Layout: Two level;Able to live on main level with bedroom/bathroom Home Access: Level entry Bathroom Shower/Tub: Health visitor: Handicapped height Home Care Services: Yes Type of Home Care Services: Home RN Home Care Agency (if known): [facility] Additional Comments: has built in seat in shower, grab bars around toilet, uses 4 wheeled walker  Discharge Living Setting Plans for Discharge Living Setting: House;Lives with (comment) (Lives in townhouse with Dtr and son-in-law) Type of Home at Discharge: House [facility]) Discharge Home Layout: One level;Able to live on main level with bedroom/bathroom Discharge Home Access: Stairs to  enter Entrance Stairs-Number of Steps: 1 Does the patient have any problems obtaining your medications?: No  Social/Family/Support Systems Patient Roles: Parent (Has 2 daughters involved in her care.  Husband deceased.) Contact Information: Delfin Edis - daughter [phone] Anticipated Caregiver: [name] and Rin Anticipated Caregiver's Contact Information: [name] - daughter [phone] Ability/Limitations of Caregiver: Daughters work.  Son-in-law - Rin is home all day, stays up stairs in the loft area, but can assist Caregiver Availability: 24/7 Discharge Plan Discussed with Primary Caregiver: Yes Is Caregiver In Agreement with Plan?: Yes Does Caregiver/Family have Issues with Lodging/Transportation while Pt is in Rehab?: No Husband died at [facility].  Goals/Additional Needs Patient/Family Goal for Rehab: PT/OT S/Min A goals, no ST Expected length of stay: 12-16 days Cultural Considerations: Protestant Dietary Needs: Heart diet, thin liquids Equipment Needs: TBD Additional Information: Patient is worried about continued stools and soiling her clothing.  Requests a BSC to be placed close by for toileting until stools are more manageable. Pt/Family Agrees to Admission and willing to participate: Yes Program Orientation Provided & Reviewed with Pt/Caregiver Including Roles  & Responsibilities: Yes   Decrease burden of Care through IP rehab admission:  N/A  Possible need for SNF placement upon discharge: No   Patient Condition: This patient's condition remains as documented in the consult dated 04/01/13, in which the Rehabilitation Physician determined and documented that the patient's condition is appropriate for intensive rehabilitative care in an inpatient rehabilitation facility. Will admit to inpatient rehab  today.  Preadmission Screen Completed By:  Trish Mage, 04/02/2013 10:53 AM ______________________________________________________________________   Discussed  status with Dr. Riley Kill on 04/02/13 at 1104 and received telephone approval for admission today.  Admission Coordinator:  Trish Mage, time1104/Date11/11/14

## 2013-04-03 ENCOUNTER — Inpatient Hospital Stay (HOSPITAL_COMMUNITY): Payer: Medicare Other | Admitting: Physical Therapy

## 2013-04-03 ENCOUNTER — Inpatient Hospital Stay (HOSPITAL_COMMUNITY): Payer: Medicare Other | Admitting: Occupational Therapy

## 2013-04-03 ENCOUNTER — Inpatient Hospital Stay (HOSPITAL_COMMUNITY): Payer: Medicare Other

## 2013-04-03 DIAGNOSIS — K56609 Unspecified intestinal obstruction, unspecified as to partial versus complete obstruction: Secondary | ICD-10-CM

## 2013-04-03 DIAGNOSIS — D62 Acute posthemorrhagic anemia: Secondary | ICD-10-CM

## 2013-04-03 DIAGNOSIS — R5381 Other malaise: Secondary | ICD-10-CM

## 2013-04-03 LAB — CBC WITH DIFFERENTIAL/PLATELET
Basophils Absolute: 0 10*3/uL (ref 0.0–0.1)
Eosinophils Absolute: 0.1 10*3/uL (ref 0.0–0.7)
Eosinophils Relative: 1 % (ref 0–5)
HCT: 24.8 % — ABNORMAL LOW (ref 36.0–46.0)
Hemoglobin: 8.2 g/dL — ABNORMAL LOW (ref 12.0–15.0)
Lymphocytes Relative: 6 % — ABNORMAL LOW (ref 12–46)
Lymphs Abs: 0.6 10*3/uL — ABNORMAL LOW (ref 0.7–4.0)
MCH: 31.4 pg (ref 26.0–34.0)
MCHC: 33.1 g/dL (ref 30.0–36.0)
MCV: 95 fL (ref 78.0–100.0)
Monocytes Absolute: 0.9 10*3/uL (ref 0.1–1.0)
Monocytes Relative: 10 % (ref 3–12)
Neutro Abs: 7.7 10*3/uL (ref 1.7–7.7)
WBC: 9.3 10*3/uL (ref 4.0–10.5)

## 2013-04-03 LAB — COMPREHENSIVE METABOLIC PANEL
Albumin: 1.9 g/dL — ABNORMAL LOW (ref 3.5–5.2)
Alkaline Phosphatase: 48 U/L (ref 39–117)
BUN: 8 mg/dL (ref 6–23)
CO2: 22 mEq/L (ref 19–32)
Chloride: 106 mEq/L (ref 96–112)
Creatinine, Ser: 0.75 mg/dL (ref 0.50–1.10)
GFR calc non Af Amer: 75 mL/min — ABNORMAL LOW (ref >90)
Glucose, Bld: 111 mg/dL — ABNORMAL HIGH (ref 70–99)
Potassium: 4.3 mEq/L (ref 3.5–5.1)
Total Bilirubin: 0.2 mg/dL — ABNORMAL LOW (ref 0.3–1.2)

## 2013-04-03 LAB — MAGNESIUM: Magnesium: 1.9 mg/dL (ref 1.5–2.5)

## 2013-04-03 MED ORDER — SODIUM CHLORIDE 0.9 % IJ SOLN
10.0000 mL | INTRAMUSCULAR | Status: DC | PRN
  Administered 2013-04-03: 20 mL
  Administered 2013-04-07 – 2013-04-11 (×6): 10 mL

## 2013-04-03 NOTE — Progress Notes (Signed)
Social Work  Social Work Assessment and Plan  Patient Details  Name: [name] MRN: [id number] Date of Birth: Jun 15, 1924  Today's Date: 04/03/2013  Problem List:  Patient Active Problem List   Diagnosis Date Noted  . Physical deconditioning 04/02/2013  . Pedal edema 04/01/2013  . Systolic CHF 03/27/2013  . Pulmonary hypertension 03/27/2013  . Hypokalemia 03/27/2013  . SBO (small bowel obstruction) 03/25/2013  . Ulcerative colitis   . Protein-calorie malnutrition, severe 03/24/2013  . Anemia of chronic disease 03/23/2013  . Acute respiratory failure with hypoxia 03/23/2013  . Atrial fibrillation with RVR 03/22/2013  . N&V (nausea and vomiting) 03/22/2013  . Porcelain gallbladder 03/22/2013  . Incisional hernia, incarcerated s/p lap repair 03/25/2013 03/22/2013  . NSTEMI (non-ST elevated myocardial infarction) 03/22/2013  . Acute renal failure 12/07/2012  . ANXIETY 12/26/2007  . HYPOTHYROIDISM 11/10/2006   Past Medical History:  Past Medical History  Diagnosis Date  . Anemia   . Hyperlipemia   . Hypertension   . Thyroid disease     hypothyroidism  . Anxiety   . Paroxysmal atrial fibrillation   . Vitamin B 12 deficiency   . CHF (congestive heart failure)   . TIA (transient ischemic attack)     4-09  . Microscopic hematuria     benign microscopic hematuria, worked up b Dr Alexis Frock  . Gall stone     porcelain gall bladder with single stone  per CT 02-2009 Dr Birdie Sons  . Osteoporosis     DEXA on 04-30-10  . Myasthenia gravis     sees Dr. Terrace Arabia   . Ulcerative colitis   . DVT of leg (deep venous thrombosis)     left leg   . CMV colitis    Past Surgical History:  Past Surgical History  Procedure Laterality Date  . Cardiac catheterization      04/1999  . Abdominal hysterectomy      with oophorectomy  . Colonoscopy  1999    normal   . Colonoscopy N/A 12/10/2012    Procedure: COLONOSCOPY;  Surgeon: Iva Boop, MD;  Location: [facility];   Service: Endoscopy;  Laterality: N/A;  . Flexible sigmoidoscopy N/A 01/26/2013    Procedure: FLEXIBLE SIGMOIDOSCOPY;  Surgeon: Iva Boop, MD;  Location: [facility];  Service: Endoscopy;  Laterality: N/A;  . Ventral hernia repair N/A 03/25/2013    Procedure: LAPAROSCOPIC VENTRAL HERNIA REPAIR ;  Surgeon: Ardeth Sportsman, MD;  Location: [facility];  Service: General;  Laterality: N/A;  . Cholecystectomy N/A 03/25/2013    Procedure: LAPAROSCOPIC CHOLECYSTECTOMY WITH INTRAOPERATIVE CHOLANGIOGRAM;  Surgeon: Ardeth Sportsman, MD;  Location: [facility];  Service: General;  Laterality: N/A;  . Insertion of mesh N/A 03/25/2013    Procedure: INSERTION OF MESH;  Surgeon: Ardeth Sportsman, MD;  Location: [facility];  Service: General;  Laterality: N/A;  . Laparoscopic lysis of adhesions N/A 03/25/2013    Procedure: LAPAROSCOPIC LYSIS OF ADHESIONS;  Surgeon: Ardeth Sportsman, MD;  Location: [facility];  Service: General;  Laterality: N/A;  . Omentectomy N/A 03/25/2013    Procedure: PARTIAL OMENTECTOMY;  Surgeon: Ardeth Sportsman, MD;  Location: [facility];  Service: General;  Laterality: N/A;   Social History:  reports that she has never smoked. She has never used smokeless tobacco. She reports that she does not drink alcohol or use illicit drugs.  Family / Support Systems Marital Status: Widow/Widower How Long?: 3 yrs Patient Roles: Parent Children: daughter, Milinda Pointer (and her husband) @ (  H) [phone] or (C) [phone] (pt lives with this daughter);  daughter, Delfin Edis @ [phone] or (C) [phone] - lives in [place] Anticipated Caregiver: [name] and son-in-law Ability/Limitations of Caregiver: Dtr [name] works.  Son-in-law not working but is 76 yo and has heart trouble. Caregiver Availability: 24/7 Family Dynamics: pt notes family continues to be supportive.  No changes in living situation since prior CIR stay in Sept.  Social History Preferred language: English Religion: Protestant Cultural Background: Pt  originally from [place] coming to [place] approx 41 yrs ago Education: HS Read: Yes Write: Yes Employment Status: Retired Fish farm manager Issues: none Guardian/Conservator: none - pt capable of making her own decisions per MD   Abuse/Neglect Physical Abuse: Denies Verbal Abuse: Denies Sexual Abuse: Denies Exploitation of patient/patient's resources: Denies Self-Neglect: Denies  Emotional Status Pt's affect, behavior adn adjustment status: Pt very pleasant, oriented and pleased with progress so far. Familiar with CIR from Sept stay.   Admits frustration with her loss of independence again, but hopeful to regain prior level of function.  Denies any significant emotional distress.  No s/s of depression or anxiety.  Will monitor and refer to neuropsych as needed. Recent Psychosocial Issues: None within past couple of years.  Husband died 3 yrs ago.  Daughter died 7 yrs ago from colon ca. Pyschiatric History: None  Patient / Family Perceptions, Expectations & Goals Pt/Family understanding of illness & functional limitations: pt with basic understanding of her medical issues "...my hernia wrapped around my intestine and I needed surgery" -  which led to  current functional deficits/ need for CIR. Premorbid pt/family roles/activities: pt notes she had regained her indpendence at home and was "doing well" Anticipated changes in roles/activities/participation: daughter and son-in-law may need to offer some increased support again as they did prior Pt/family expectations/goals: Hopeful she can regain prior independence  [employer]: None Premorbid Home Care/DME Agencies: Other (Comment) ([facility] still providing HHRN services) Transportation available at discharge: yes  Discharge Planning Living Arrangements: Children Support Systems: Children Type of Residence: Private residence Insurance Resources: [employer] Financial Resources: Restaurant manager, fast food Screen  Referred: No Living Expenses: Lives with family Money Management: Patient;Family Does the patient have any problems obtaining your medications?: No Home Management: patient and family Patient/Family Preliminary Plans: Pt to return home with her daughter and son-in-law to provide any needed assistance Social Work Anticipated Follow Up Needs: HH/OP Expected length of stay: 12-14 days  Clinical Impression Pleasant woman here again on CIR after recent stay with [place] in September.  GI surgery and now deconditioned.  No changes to living situation from last stay. Lives with daughter and son-in-law who provide intermittent support but son-in-law "always home".  No emotional distress noted beyond general frustration with another hospitalization so quickly. Motivated for therapies and to get home.  Linna Thebeau 04/03/2013, 12:49 PM

## 2013-04-03 NOTE — Progress Notes (Signed)
Inpatient Rehabilitation Center Individual Statement of Services  Patient Name:  [name]  Date:  04/03/2013  Welcome to the [facility]patient Rehabilitation Center.  Our goal is to provide you with an individualized program based on your diagnosis and situation, designed to meet your specific needs.  With this comprehensive rehabilitation program, you will be expected to participate in at least 3 hours of rehabilitation therapies Monday-Friday, with modified therapy programming on the weekends.  Your rehabilitation program will include the following services:  Physical Therapy (PT), Occupational Therapy (OT), 24 hour per day rehabilitation nursing, Case Management (Social Worker), Rehabilitation Medicine, Nutrition Services and Pharmacy Services  Weekly team conferences will be held on Tuesdays to discuss your progress.  Your Social Worker will talk with you frequently to get your input and to update you on team discussions.  Team conferences with you and your family in attendance may also be held.  Expected length of stay: 12-14 days      Overall anticipated outcome: supervision to modified independent  Depending on your progress and recovery, your program may change. Your Social Worker will coordinate services and will keep you informed of any changes. Your Social Worker's name and contact numbers are listed  below.  The following services may also be recommended but are not provided by the Inpatient Rehabilitation Center:   Driving Evaluations  Home Health Rehabiltiation Services  Outpatient Rehabilitation Services    Arrangements will be made to provide these services after discharge if needed.  Arrangements include referral to agencies that provide these services.  Your insurance has been verified to be:  Medicare and AARP Your primary doctor is:  Dr. Gershon Crane  Pertinent information will be shared with your doctor and your insurance company.  Social Worker:  [place], [place]  [phone] or [phone]   Information discussed with and copy given to patient by: Amada Jupiter, 04/03/2013, 11:58 AM

## 2013-04-03 NOTE — Evaluation (Addendum)
Physical Therapy Assessment and Plan  Patient Details  Name: [name] MRN: [id number] Date of Birth: [birth year]  PT Diagnosis: Abnormality of gait, Difficulty walking, Dizziness and giddiness and Muscle weakness, edema Rehab Potential: Good ELOS: 12-14 days   Today's Date: 04/03/2013 Time: 1330-1420 Time Calculation (min): 50 min  Problem List:  Patient Active Problem List   Diagnosis Date Noted  . Physical deconditioning 04/02/2013  . Pedal edema 04/01/2013  . Systolic CHF 03/27/2013  . Pulmonary hypertension 03/27/2013  . Hypokalemia 03/27/2013  . SBO (small bowel obstruction) 03/25/2013  . Ulcerative colitis   . Protein-calorie malnutrition, severe 03/24/2013  . Anemia of chronic disease 03/23/2013  . Acute respiratory failure with hypoxia 03/23/2013  . Atrial fibrillation with RVR 03/22/2013  . N&V (nausea and vomiting) 03/22/2013  . Porcelain gallbladder 03/22/2013  . Incisional hernia, incarcerated s/p lap repair 03/25/2013 03/22/2013  . NSTEMI (non-ST elevated myocardial infarction) 03/22/2013  . Acute renal failure 12/07/2012  . ANXIETY 12/26/2007  . HYPOTHYROIDISM 11/10/2006    Past Medical History:  Past Medical History  Diagnosis Date  . Anemia   . Hyperlipemia   . Hypertension   . Thyroid disease     hypothyroidism  . Anxiety   . Paroxysmal atrial fibrillation   . Vitamin B 12 deficiency   . CHF (congestive heart failure)   . TIA (transient ischemic attack)     4-09  . Microscopic hematuria     benign microscopic hematuria, worked up b Dr Alexis Frock  . Gall stone     porcelain gall bladder with single stone  per CT 02-2009 Dr Birdie Sons  . Osteoporosis     DEXA on 04-30-10  . Myasthenia gravis     sees Dr. Terrace Arabia   . Ulcerative colitis   . DVT of leg (deep venous thrombosis)     left leg   . CMV colitis    Past Surgical History:  Past Surgical History  Procedure Laterality Date  . Cardiac catheterization      04/1999  .  Abdominal hysterectomy      with oophorectomy  . Colonoscopy  1999    normal   . Colonoscopy N/A 12/10/2012    Procedure: COLONOSCOPY;  Surgeon: Iva Boop, MD;  Location: [facility];  Service: Endoscopy;  Laterality: N/A;  . Flexible sigmoidoscopy N/A 01/26/2013    Procedure: FLEXIBLE SIGMOIDOSCOPY;  Surgeon: Iva Boop, MD;  Location: [facility];  Service: Endoscopy;  Laterality: N/A;  . Ventral hernia repair N/A 03/25/2013    Procedure: LAPAROSCOPIC VENTRAL HERNIA REPAIR ;  Surgeon: Ardeth Sportsman, MD;  Location: [facility];  Service: General;  Laterality: N/A;  . Cholecystectomy N/A 03/25/2013    Procedure: LAPAROSCOPIC CHOLECYSTECTOMY WITH INTRAOPERATIVE CHOLANGIOGRAM;  Surgeon: Ardeth Sportsman, MD;  Location: [facility];  Service: General;  Laterality: N/A;  . Insertion of mesh N/A 03/25/2013    Procedure: INSERTION OF MESH;  Surgeon: Ardeth Sportsman, MD;  Location: [facility];  Service: General;  Laterality: N/A;  . Laparoscopic lysis of adhesions N/A 03/25/2013    Procedure: LAPAROSCOPIC LYSIS OF ADHESIONS;  Surgeon: Ardeth Sportsman, MD;  Location: [facility];  Service: General;  Laterality: N/A;  . Omentectomy N/A 03/25/2013    Procedure: PARTIAL OMENTECTOMY;  Surgeon: Ardeth Sportsman, MD;  Location: [facility];  Service: General;  Laterality: N/A;    Assessment & Plan Clinical Impression: [name] is a 79 y.o. female with history of PAF, CMV colitis with chronic diarrhea/minimal  nausea. Admitted on 03/22/13 with poor po intake, bilious vomiting and inability to keep anything now. She was found to have SBO with incarcerated umbilical hernia and NGT placed per [facility] input. Cardiology consulted for management of A Fib with NSTEMI due to demand ischemia. 2D echo done revealing decrease in EF to 30-35% with question of Takasubo CM due to rapid A fib. She was started on amiodarone as well and is not a candidate for anticoagulation. On 11/03/1, she underwent Lap ventral hernia repair with mesh, lysis  of adhesions and lap chole with partial omentectomy by Dr. Michaell Cowing. Diarrhea treated with rectal tube and slowly improving.-- c-diff check negative and stools bulking up. Post op fluid overload treated with diuretics. ABLA being monitored--hgb has been variable in 7.3- 7.9 range. Cardiology recommends follow up in 2 weeks past discharge for discussion regarding NOAC due to multiple embolic risk factors as well as follow up echo. Therapies initiated and family requesting CIR (CIR 9/15-9/23 for deconditioning.) Patient transferred to CIR on 04/02/2013.   Patient currently requires min-mod assist with mobility secondary to muscle weakness, decreased cardiorespiratoy endurance and decreased standing balance and decreased postural control.  Prior to hospitalization, patient was modified independent  with mobility and lived with Daughter in a House home.  Home access is  Level entry.  Patient will benefit from skilled PT intervention to maximize safe functional mobility, minimize fall risk and decrease caregiver burden for planned discharge home with intermittent assist.  Anticipate patient will benefit from follow up [facility] at discharge.  PT - End of Session Activity Tolerance: Tolerates 10 - 20 min activity with multiple rests Endurance Deficit: Yes Endurance Deficit Description: Patient with increased dizziness during upright movements, TEDs & abdominal binder donned when OOB. Patient with overall fatigue PT Assessment Rehab Potential: Good PT Patient demonstrates impairments in the following area(s): Balance;Endurance;Motor PT Transfers Functional Problem(s): Bed Mobility;Bed to Chair;Car;Furniture PT Locomotion Functional Problem(s): Ambulation;Wheelchair Mobility;Stairs PT Plan PT Intensity: Minimum of 1-2 x/day ,45 to 90 minutes PT Frequency: 5 out of 7 days PT Duration Estimated Length of Stay: 12-14 days PT Treatment/Interventions: Ambulation/gait training;Balance/vestibular training;Discharge  planning;DME/adaptive equipment instruction;Functional mobility training;Neuromuscular re-education;Patient/family education;Skin care/wound management;Stair training;Therapeutic Activities;Therapeutic Exercise;UE/LE Strength taining/ROM;UE/LE Coordination activities;Wheelchair propulsion/positioning PT Transfers Anticipated Outcome(s): Mod(I)-Supervision PT Locomotion Anticipated Outcome(s): Mod(I) PT Recommendation Follow Up Recommendations: Home health PT;24 hour supervision/assistance Equipment Recommended: None recommended by PT Equipment Details: PT appears to own all anticipated DME needs  Skilled Therapeutic Intervention 1:1. Pt received supine in bed, verbalized feeling very fatigued but agreeable to therapy. PT evaluation performed, see detailed information below. Tx initiated w/ focus on functional transfers and therex. Pt demonstrating difficulty w/ bed mobility and t/f sit<>stand from standard height surfaces. Cues for technique and sequencing. Pt practiced t/f sit<>stand from elevated bed height x3 for endurance and consistency. Pt w/ good tolerance to seated and standard therex. Seated therex included 2x10 reps of: toe raises, heel raises, marching and LAQ. Supine therex included 2x10 reps of: ankle pumps, quad sets, glute sets and SAQ. Pt able to amb 2x10' w/ RW, min A and seated rest break between reps. Pt w/ verbalizations of increased dizziness during standing, but decreasing w/ seated rest. Pt also verbalized, "my legs just feel like lead." Pt educated on benefit of TEDs, ankle pumps, elevated B LE and mobility for edema management. Pt supine in bed at end of session w/ all needs in reach, bed alarm on.   PT Evaluation Precautions/Restrictions Precautions Precautions: Fall Precaution Comments: monitor BP, previously  patient has been orthostatic Restrictions Weight Bearing Restrictions: No General Chart Reviewed: Yes Amount of Missed PT Time (min): 10 Minutes Missed Time  Reason: Patient fatigue Family/Caregiver Present: No Vital Signs  Pain Pain Assessment Pain Assessment: No/denies pain Home Living/Prior Functioning Home Living Living Arrangements: Children Available Help at Discharge: Family;Available 24 hours/day Type of Home: House Home Access: Level entry Home Layout: Two level;Able to live on main level with bedroom/bathroom  Lives With: Daughter Prior Function Level of Independence: Independent with basic ADLs;Independent with homemaking with ambulation;Independent with transfers;Needs assistance with gait  Able to Take Stairs?: No Driving: No Vocation: Retired Optometrist - History Baseline Vision: Wears glasses all the time Patient Visual Report: No change from baseline Perception Perception: Within Functional Limits Praxis Praxis: Intact  Cognition Overall Cognitive Status: Within Functional Limits for tasks assessed Orientation Level: Oriented X4 Sustained Attention: Appears intact Memory: Appears intact Awareness: Appears intact Problem Solving: Appears intact Safety/Judgment: Appears intact Sensation Sensation Light Touch: Appears Intact Proprioception: Appears Intact Coordination Gross Motor Movements are Fluid and Coordinated: Yes Motor  Motor Motor: Within Functional Limits  Mobility Bed Mobility Bed Mobility: Sit to Supine;Right Sidelying to Sit;Rolling Right Rolling Right: 4: Min assist Rolling Right Details: Tactile cues for sequencing;Verbal cues for sequencing;Verbal cues for technique Right Sidelying to Sit: 3: Mod assist;HOB flat Right Sidelying to Sit Details: Tactile cues for sequencing;Verbal cues for sequencing;Verbal cues for technique Supine to Sit: HOB flat;3: Mod assist Supine to Sit Details: Verbal cues for sequencing Transfers Transfers: Yes Sit to Stand: 4: Min assist;With armrests;From bed;From elevated surface;3: Mod assist Sit to Stand Details: Verbal cues for sequencing;Verbal  cues for technique;Tactile cues for placement Stand to Sit: 4: Min assist Stand to Sit Details (indicate cue type and reason): Verbal cues for technique;Verbal cues for sequencing;Tactile cues for placement Stand Pivot Transfers: 4: Min assist Stand Pivot Transfer Details: Verbal cues for sequencing;Verbal cues for technique;Tactile cues for sequencing;Tactile cues for placement Locomotion  Ambulation Ambulation: Yes Ambulation/Gait Assistance: 4: Min assist Ambulation Distance (Feet): 10 Feet Assistive device: Rolling walker Ambulation/Gait Assistance Details: Verbal cues for precautions/safety;Verbal cues for safe use of DME/AE Gait Gait: Yes Gait Pattern: Impaired Gait Pattern: Step-through pattern Gait velocity: decreased; wide BOS, decreased B hip/knee flexion and B foot clearance 2/2 edema Stairs / Additional Locomotion Stairs: No (Pt declined going out of room 2/2 dizziness, fatigue ) Wheelchair Mobility Wheelchair Mobility: No (Pt declined going out of room 2/2 dizziness, fatigue)  Trunk/Postural Assessment  Postural Control Postural Control: Deficits on evaluation Postural Limitations: decreased core strength as noted by progressive posterior lean during seated therex and MMT  Balance Balance Balance Assessed: Yes Static Sitting Balance Static Sitting - Balance Support: No upper extremity supported;Feet supported Static Sitting - Level of Assistance: 5: Stand by assistance Dynamic Sitting Balance Dynamic Sitting - Balance Support: Left upper extremity supported;Right upper extremity supported;Feet supported Dynamic Sitting - Level of Assistance: 5: Stand by assistance Dynamic Sitting - Balance Activities: Lateral lean/weight shifting;Forward lean/weight shifting;Reaching for objects Static Standing Balance Static Standing - Balance Support: Bilateral upper extremity supported Static Standing - Level of Assistance: 5: Stand by assistance Dynamic Standing Balance Dynamic  Standing - Balance Support: Bilateral upper extremity supported Dynamic Standing - Level of Assistance: 4: Min assist Dynamic Standing - Balance Activities: Lateral lean/weight shifting;Forward lean/weight shifting;Reaching for objects Extremity Assessment      RLE Assessment RLE Assessment: Exceptions to [facility] RLE Strength Right Hip Flexion: 3+/5 Right Knee Flexion: 4/5 Right Knee  Extension: 4/5 Right Ankle Dorsiflexion: 4/5 Right Ankle Plantar Flexion: 4/5 LLE Assessment LLE Assessment: Exceptions to [facility] LLE Strength Left Hip Flexion: 3+/5 Left Knee Flexion: 4/5 Left Knee Extension: 4/5 Left Ankle Dorsiflexion: 4/5 Left Ankle Plantar Flexion: 4/5  FIM:  FIM - Bed/Chair Transfer Bed/Chair Transfer Assistive Devices: Manufacturing systems engineer Transfer: 3: Supine > Sit: Mod A (lifting assist/Pt. 50-74%/lift 2 legs;3: Sit > Supine: Mod A (lifting assist/Pt. 50-74%/lift 2 legs);4: Bed > Chair or W/C: Min A (steadying Pt. > 75%);4: Chair or W/C > Bed: Min A (steadying Pt. > 75%) FIM - Locomotion: Ambulation Locomotion: Ambulation Assistive Devices: Walker - Rolling Ambulation/Gait Assistance: 4: Min assist Locomotion: Ambulation: 1: Travels less than 50 ft with minimal assistance (Pt.>75%)   Refer to Care Plan for Long Term Goals  Recommendations for other services: None  Discharge Criteria: Patient will be discharged from PT if patient refuses treatment 3 consecutive times without medical reason, if treatment goals not met, if there is a change in medical status, if patient makes no progress towards goals or if patient is discharged from hospital.  The above assessment, treatment plan, treatment alternatives and goals were discussed and mutually agreed upon: by patient  Denzil Hughes 04/03/2013, 4:22 PM

## 2013-04-03 NOTE — Progress Notes (Signed)
Patient information reviewed and entered into eRehab system by Antha Niday, RN, CRRN, PPS Coordinator.  Information including medical coding and functional independence measure will be reviewed and updated through discharge.     Per nursing patient was given "Data Collection Information Summary for Patients in Inpatient Rehabilitation Facilities with attached "Privacy Act Statement-Health Care Records" upon admission.  

## 2013-04-03 NOTE — Progress Notes (Signed)
Delayed note entry Discussed with pa and agree with plan  Crissa Sowder M. Aston Lawhorn, MD, FACS General, Bariatric, & Minimally Invasive Surgery [facility]  

## 2013-04-03 NOTE — Evaluation (Signed)
Occupational Therapy Assessment and Plan & Session Note  Patient Details  Name: [name] MRN: [id number] Date of Birth: [birth year]  OT Diagnosis: abnormal posture, acute pain and muscle weakness (generalized) Rehab Potential: Rehab Potential: Excellent ELOS: 12-14 days   Today's Date: 04/03/2013  Problem List:  Patient Active Problem List   Diagnosis Date Noted  . Physical deconditioning 04/02/2013  . Pedal edema 04/01/2013  . Systolic CHF 03/27/2013  . Pulmonary hypertension 03/27/2013  . Hypokalemia 03/27/2013  . SBO (small bowel obstruction) 03/25/2013  . Ulcerative colitis   . Protein-calorie malnutrition, severe 03/24/2013  . Anemia of chronic disease 03/23/2013  . Acute respiratory failure with hypoxia 03/23/2013  . Atrial fibrillation with RVR 03/22/2013  . N&V (nausea and vomiting) 03/22/2013  . Porcelain gallbladder 03/22/2013  . Incisional hernia, incarcerated s/p lap repair 03/25/2013 03/22/2013  . NSTEMI (non-ST elevated myocardial infarction) 03/22/2013  . Acute renal failure 12/07/2012  . ANXIETY 12/26/2007  . HYPOTHYROIDISM 11/10/2006    Past Medical History:  Past Medical History  Diagnosis Date  . Anemia   . Hyperlipemia   . Hypertension   . Thyroid disease     hypothyroidism  . Anxiety   . Paroxysmal atrial fibrillation   . Vitamin B 12 deficiency   . CHF (congestive heart failure)   . TIA (transient ischemic attack)     4-09  . Microscopic hematuria     benign microscopic hematuria, worked up b Dr Alexis Frock  . Gall stone     porcelain gall bladder with single stone  per CT 02-2009 Dr Birdie Sons  . Osteoporosis     DEXA on 04-30-10  . Myasthenia gravis     sees Dr. Terrace Arabia   . Ulcerative colitis   . DVT of leg (deep venous thrombosis)     left leg   . CMV colitis    Past Surgical History:  Past Surgical History  Procedure Laterality Date  . Cardiac catheterization      04/1999  . Abdominal hysterectomy      with  oophorectomy  . Colonoscopy  1999    normal   . Colonoscopy N/A 12/10/2012    Procedure: COLONOSCOPY;  Surgeon: Iva Boop, MD;  Location: [facility];  Service: Endoscopy;  Laterality: N/A;  . Flexible sigmoidoscopy N/A 01/26/2013    Procedure: FLEXIBLE SIGMOIDOSCOPY;  Surgeon: Iva Boop, MD;  Location: [facility];  Service: Endoscopy;  Laterality: N/A;  . Ventral hernia repair N/A 03/25/2013    Procedure: LAPAROSCOPIC VENTRAL HERNIA REPAIR ;  Surgeon: Ardeth Sportsman, MD;  Location: [facility];  Service: General;  Laterality: N/A;  . Cholecystectomy N/A 03/25/2013    Procedure: LAPAROSCOPIC CHOLECYSTECTOMY WITH INTRAOPERATIVE CHOLANGIOGRAM;  Surgeon: Ardeth Sportsman, MD;  Location: [facility];  Service: General;  Laterality: N/A;  . Insertion of mesh N/A 03/25/2013    Procedure: INSERTION OF MESH;  Surgeon: Ardeth Sportsman, MD;  Location: [facility];  Service: General;  Laterality: N/A;  . Laparoscopic lysis of adhesions N/A 03/25/2013    Procedure: LAPAROSCOPIC LYSIS OF ADHESIONS;  Surgeon: Ardeth Sportsman, MD;  Location: [facility];  Service: General;  Laterality: N/A;  . Omentectomy N/A 03/25/2013    Procedure: PARTIAL OMENTECTOMY;  Surgeon: Ardeth Sportsman, MD;  Location: [facility];  Service: General;  Laterality: N/A;    Assessment & Plan  Clinical Impression: [name] is a 79 y.o. female with history of PAF, CMV colitis with chronic diarrhea/minimal nausea. Admitted on 03/22/13 with  poor po intake, bilious vomiting and inability to keep anything now. She was found to have SBO with incarcerated umbilical hernia and NGT placed per CCS input. Cardiology consulted for management of A Fib with NSTEMI due to demand ischemia. 2D echo done revealing decrease in EF to 30-35% with question of Takasubo CM due to rapid A fib. She was started on amiodarone as well and is not a candidate for anticoagulation. On 11/03/1, she underwent Lap ventral hernia repair with mesh, lysis of adhesions and lap chole with  partial omentectomy by Dr. Michaell Cowing. Diarrhea treated with rectal tube and slowly improving.-- c-diff check negative and stools bulking up. Post op fluid overload treated with diuretics. ABLA being monitored--hgb has been variable in 7.3- 7.9 range. Cardiology recommends follow up in 2 weeks past discharge for discussion regarding NOAC due to multiple embolic risk factors as well as follow up echo. Therapies initiated and family requesting CIR (CIR 9/15-9/23 for deconditioning.). Patient transferred to CIR on 04/02/2013 .    Patient currently requires min-max assist with basic self-care skills secondary to muscle weakness and muscle joint tightness and decreased sitting balance, decreased standing balance, decreased postural control and decreased balance strategies.  Prior to hospitalization, patient could complete ADLs at a modified independent level.   Patient will benefit from skilled intervention to increase independence with basic self-care skills prior to discharge home with care partner.  Anticipate patient will require 24 hour supervision and follow up home health.  OT - End of Session Activity Tolerance: Tolerates 10 - 20 min activity with multiple rests Endurance Deficit: Yes Endurance Deficit Description: Patient with increased dizziness during upright movements, TEDs & abdominal binder donned when OOB. Patient with overall fatigue OT Assessment Rehab Potential: Excellent Barriers to Discharge:  (none known at this time) OT Patient demonstrates impairments in the following area(s): Balance;Edema;Endurance;Motor;Pain;Perception;Safety;Skin Integrity OT Basic ADL's Functional Problem(s): Grooming;Bathing;Dressing;Toileting OT Advanced ADL's Functional Problem(s):  (n/a at this time) OT Transfers Functional Problem(s): Toilet;Tub/Shower OT Additional Impairment(s): None (functional strengthening of BUEs) OT Plan OT Intensity: Minimum of 1-2 x/day, 45 to 90 minutes OT Frequency: 5 out of 7  days OT Duration/Estimated Length of Stay: 12-14 days OT Treatment/Interventions: Warden/ranger;Therapeutic Exercise;UE/LE Coordination activities;Self Care/advanced ADL retraining;UE/LE Strength taining/ROM;Therapeutic Activities;Functional mobility training;DME/adaptive equipment instruction;Discharge planning;Patient/family education;Community reintegration;Pain management;Skin care/wound managment;Psychosocial support OT Self Feeding Anticipated Outcome(s): mod I (secondary to dentures) OT Basic Self-Care Anticipated Outcome(s): supervision OT Toileting Anticipated Outcome(s): supervision OT Bathroom Transfers Anticipated Outcome(s): supervision OT Recommendation Recommendations for Other Services:  (none at this time) Patient destination: Home Follow Up Recommendations: Home health OT Equipment Details: patient has built in shower seat and handicap height toilet seats  OT Evaluation Precautions/Restrictions  Precautions Precautions: Fall Precaution Comments: monitor BP, previously patient has been orthostatic Restrictions Weight Bearing Restrictions: No  General Chart Reviewed: Yes Family/Caregiver Present: No  Vital Signs Therapy Vitals Temp: 98.3 F (36.8 C) Temp src: Oral Pulse Rate: 67 Resp: 16 BP: 101/64 mmHg Patient Position, if appropriate: Sitting Oxygen Therapy SpO2: 98 % O2 Device: None (Room air)  Pain Pain Assessment Pain Assessment: No/denies pain Pain Score: 0-No pain Faces Pain Scale: No hurt  Home Living/Prior Functioning Home Living Available Help at Discharge: Family;Available 24 hours/day (son-in-law is home 24/7) Type of Home: House (townhouse) Home Access: Level entry Home Layout: Two level;Able to live on main level with bedroom/bathroom Additional Comments: has built in seat in shower, grab bars around toilet, uses 4 wheeled walker  Lives With: Daughter (pt lives with  daughter and son-in-law at their house) IADL  History Homemaking Responsibilities: No Current License: No Mode of Transportation: Family Occupation: Retired 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" Leisure and Hobbies: playing computer games, watching TV, reading, getting pedicures, getting hair done Prior Function Level of Independence: Independent with basic ADLs;Independent with homemaking with ambulation;Independent with transfers;Needs assistance with gait (used DME and AE for increased independence)  Able to Take Stairs?: No Driving: No  ADL - See FIM  Vision/Perception  Vision - History Baseline Vision: Wears glasses all the time Patient Visual Report: No change from baseline Vision - Assessment Eye Alignment: Within Functional Limits Perception Perception: Within Functional Limits Praxis Praxis: Intact   Cognition Overall Cognitive Status: Within Functional Limits for tasks assessed Orientation Level: Oriented X4 Memory: Appears intact Awareness: Appears intact Problem Solving: Appears intact Safety/Judgment: Appears intact  Sensation Sensation Additional Comments: BUE sensation appears intact Coordination Gross Motor Movements are Fluid and Coordinated: Yes Fine Motor Movements are Fluid and Coordinated: Yes  Motor  Motor Motor: Within Functional Limits  Mobility  Bed Mobility Bed Mobility: Sit to Supine;Right Sidelying to Sit;Rolling Right Rolling Right: 4: Min assist Rolling Right Details: Tactile cues for sequencing;Verbal cues for sequencing;Verbal cues for technique Right Sidelying to Sit: 3: Mod assist;HOB flat Right Sidelying to Sit Details: Tactile cues for sequencing;Verbal cues for sequencing;Verbal cues for technique Supine to Sit: HOB flat;3: Mod assist Supine to Sit Details: Verbal cues for sequencing Transfers Sit to Stand: 4: Min assist;With armrests;From bed;From elevated surface;3: Mod assist Sit to Stand Details: Verbal cues for sequencing;Verbal cues for technique;Tactile  cues for placement Stand to Sit: 4: Min assist Stand to Sit Details (indicate cue type and reason): Verbal cues for technique;Verbal cues for sequencing;Tactile cues for placement   Trunk/Postural Assessment  Postural Control Postural Control: Deficits on evaluation Postural Limitations: decreased core strength as noted by progressive posterior lean during seated therex and MMT   Balance Balance Balance Assessed: Yes Static Sitting Balance Static Sitting - Balance Support: No upper extremity supported;Feet supported Static Sitting - Level of Assistance: 5: Stand by assistance Dynamic Sitting Balance Dynamic Sitting - Balance Support: Left upper extremity supported;Right upper extremity supported;Feet supported Dynamic Sitting - Level of Assistance: 5: Stand by assistance Dynamic Sitting - Balance Activities: Lateral lean/weight shifting;Forward lean/weight shifting;Reaching for objects Static Standing Balance Static Standing - Balance Support: Bilateral upper extremity supported Static Standing - Level of Assistance: 5: Stand by assistance Dynamic Standing Balance Dynamic Standing - Balance Support: Bilateral upper extremity supported Dynamic Standing - Level of Assistance: 4: Min assist Dynamic Standing - Balance Activities: Lateral lean/weight shifting;Forward lean/weight shifting;Reaching for objects  Extremity/Trunk Assessment RUE Assessment RUE Assessment: Within Functional Limits (can benefit from strengthening to BUEs) LUE Assessment LUE Assessment: Within Functional Limits (can benefit from strengthening to BUEs)  FIM:  FIM - Eating Eating Activity: 0: Activity did not occur FIM - Grooming Grooming: 0: Activity did not occur FIM - Bathing Bathing Steps Patient Completed: Chest;Right Arm;Left Arm;Abdomen Bathing: 2: Max-Patient completes 3-4 71f 10 parts or 25-49% FIM - Upper Body Dressing/Undressing Upper body dressing/undressing: 0: Wears gown/pajamas-no public  clothing FIM - Lower Body Dressing/Undressing Lower body dressing/undressing: 0: Activity did not occur FIM - Toileting Toileting: 1: Total-Patient completed zero steps, helper did all 3 FIM - Bed/Chair Transfer Bed/Chair Transfer Assistive Devices: HOB elevated;Bed rails Bed/Chair Transfer: 5: Supine > Sit: Supervision (verbal cues/safety issues);4: Bed > Chair or W/C: Min A (steadying Pt. > 75%) FIM - Toilet Transfers  Museum/gallery curator Devices: Building control surveyor Transfers: 4-To toilet/BSC: Min A (steadying Pt. > 75%);4-From toilet/BSC: Min A (steadying Pt. > 75%) FIM - Tub/Shower Transfers Tub/shower Transfers: 0-Activity did not occur or was simulated   Refer to Care Plan for Long Term Goals  Recommendations for other services: None at this time.   Discharge Criteria: Patient will be discharged from OT if patient refuses treatment 3 consecutive times without medical reason, if treatment goals not met, if there is a change in medical status, if patient makes no progress towards goals or if patient is discharged from hospital.  The above assessment, treatment plan, treatment alternatives and goals were discussed and mutually agreed upon: by patient  ------------------------------------------------------------------------------------------------------------------  SESSION NOTE 0830-0930 - 60 Minutes Individual Therapy No complaints of pain Patient found supine in bed. Initial 1:1 occupational therapy evaluation completed. RN present for some of session, giving meds and checking BP. Patient's BP in supine without TEDs or abdominal binder =92/59. Therapist donned bilateral thigh high TED hose and BP increased -> 101/64 while patient was seated on EOB. Patient completed bed mobility with close supervision; HOB raised and using bed rails. From here, patient sat edge of bed to complete UB/LB bathing; max assist (patient only bathed UB). Patient requested to urgently use  bathroom, minimal assistance for edge of bed <> BSC transfer. Total assist for toileting (peri care and clothing management). Therapist donned abdominal binder. Patient with no clothes, therefore donned hospital gowns. RW used for all sit<>stands and functional ambulation/mobility. Patient transferred -> recliner after ADL and therapist left patient seated in recliner with head slightly reclined and bilateral LEs elevated. All needs left within reach.   Keshona Kartes 04/03/2013, 3:23 PM

## 2013-04-03 NOTE — Progress Notes (Signed)
Occupational Therapy Session Note  Patient Details  Name: Fionnuala Hemmerich MRN: [id number] Date of Birth: [birth year]  Today's Date: 04/03/2013 Time: 1430-1500 Time Calculation (min): 30 min  Short Term Goals: Week 1:  OT Short Term Goal 1 (Week 1): Patient will complete UB/LB bathing with minimal assistance OT Short Term Goal 2 (Week 1): Patient will complete LB dressing with minimal assistance OT Short Term Goal 3 (Week 1): Patient will complete toileting tasks (peri care & clothing management) with moderate assistance OT Short Term Goal 4 (Week 1): Patient will complete shower stall transfers with minimal assistance  Skilled Therapeutic Interventions/Progress Updates: ADL-retraining with emphasis on transfers (shower stall/tub bench), seated grooming, and endurance.   Patient able to ambulate from bed to bathroom with contact guard for safety and completed transfer to tub bench with verbal cues and steadying assist.  Patient reported feeling light-headed and requested rest break for 2 minutes prior to return to w/c for seated grooming (hair care using shampoo cap) at sink side.   Patient required re-ed on need for abdominal binder before donning binder this session but remained alert and attentive to treatment objectives.    Therapy Documentation Precautions:  Precautions Precautions: Fall Precaution Comments: monitor BP, previously patient has been orthostatic Restrictions Weight Bearing Restrictions: No  Pain: Pain Assessment Pain Assessment: No/denies pain  See FIM for current functional status  Therapy/Group: Individual Therapy  Lycan Davee 04/03/2013, 3:34 PM

## 2013-04-03 NOTE — Progress Notes (Signed)
Physical Therapy Note  Patient Details  Name: [name] MRN: [id number] Date of Birth: 03/05/1925 Today's Date: 04/03/2013  1530-1600 (30 minutes) individual Pain: no reported pain Focus of treatment : therapeutic exercise focused on bilateral LE strengthening Treatment: Pt in bed upon arrival; pt states that she does not want to get out of bed at this time; therapeutic exercise bilateral LEs X 20 in supine - ankle pumps, heel slides, hip abduction, SLR, SAQs , quad sets.    Correy Weidner,JIM 04/03/2013, 3:53 PM

## 2013-04-03 NOTE — Progress Notes (Signed)
Subjective/Complaints: Slept well last night. No complains. Denies pain, sob, cough.  A 12 point review of systems has been performed and if not noted above is otherwise negative.   Objective: Vital Signs: Blood pressure 121/55, pulse 63, temperature 98.3 F (36.8 C), temperature source Oral, resp. rate 16, weight 82.419 kg (181 lb 11.2 oz), SpO2 98.00%. No results found.  Recent Labs  04/02/13 0455 04/03/13 0500  WBC 9.3 9.3  HGB 7.5* 8.2*  HCT 23.2* 24.8*  PLT 131* 171    Recent Labs  04/02/13 0455 04/03/13 0500  NA 134* 135  K 4.4 4.3  CL 108 106  GLUCOSE 98 111*  BUN 7 8  CREATININE 0.76 0.75  CALCIUM 7.8* 7.8*   CBG (last 3)  No results found for this basename: GLUCAP,  in the last 72 hours  Wt Readings from Last 3 Encounters:  04/03/13 82.419 kg (181 lb 11.2 oz)  04/02/13 82.2 kg (181 lb 3.5 oz)  04/02/13 82.2 kg (181 lb 3.5 oz)    Physical Exam:  Constitutional: She appears well-developed and well-nourished. No distress  HENT: oral mucosa pink and moist, dentition fair/good  Head: Normocephalic.  Eyes: EOM are normal. Pupils are equal, round, and reactive to light.  Neck: Normal range of motion. Neck supple. No JVD present. No tracheal deviation present. No thyromegaly present.  Cardiovascular: Normal rate.  No murmur heard. No rubs, gallops  Respiratory: Effort normal. No respiratory distress. No wheezes  GI: She exhibits no distension. There is tenderness. Small incision sites well healed over abdomen. BS+ Musculoskeletal:  Edema 1+ in LE's, tr  in UE's.  Homan's -, left knee a little tender with ROM.  Neurological: She is alert. No cranial nerve deficit. Coordination normal.  UE's 4/5. LE's 3- HF, 3/5 KE, 4/5 distally at feet. No gross sensory deficits.  Psychiatric: She has a normal mood and affect. Her behavior is normal. Judgment and thought content normal.  Skin: mild gluteal breakdown noted   Assessment/Plan: 1. Functional deficits  secondary to deconditioning related to an incarcerated small bowel, s/p lap chole and lysis of adhsesions which require 3+ hours per day of interdisciplinary therapy in a comprehensive inpatient rehab setting. Physiatrist is providing close team supervision and 24 hour management of active medical problems listed below. Physiatrist and rehab team continue to assess barriers to discharge/monitor patient progress toward functional and medical goals. FIM:                   Comprehension Comprehension Mode: Auditory Comprehension: 6-Follows complex conversation/direction: With extra time/assistive device  Expression Expression Mode: Verbal Expression: 6-Expresses complex ideas: With extra time/assistive device  Social Interaction Social Interaction: 6-Interacts appropriately with others with medication or extra time (anti-anxiety, antidepressant).  Problem Solving Problem Solving: 6-Solves complex problems: With extra time  Memory Memory: 6-More than reasonable amt of time  Medical Problem List and Plan:  Deconditioning due to incarcerated hernia with SBO, cholelithiasis with acute and chronically inflamed GB--requiring L, 1. DVT Prophylaxis/Anticoagulation: Pharmaceutical: Lovenox  2. Pain Management: tylenol for now. No substantial pain at present. Local remedies if possible also  3. Mood: Continue bupropion daily. Motivated to get better--LCSW to follow for evaluation.  4. Neuropsych: This patient is capable of making decisions on her own behalf.  5. 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 Monitor lytes with routine check. Continue probiotics and mycophenolate. Adjust regimen as needed.  -low fiber diet  6. NSTEMI due to demand ischemia:  7. ABLA: up to 8.2 today.   -Continue iron supplement  -serial hgbs.  8. A Fib: In NSR on amiodarone and coreg. Continue to monitor heart rate on bid basis.  9. Cardiomyopathy:   weights daily. Low  salt diet to avoid recurrent volume overload. On coreg, Lipitor and lisinopril. orthostasis reported with activity on acute.  check orthostatic BP's and adjust medications as indicated.     LOS (Days) 1 A FACE TO FACE EVALUATION WAS PERFORMED  SWARTZ,ZACHARY T 04/03/2013 7:58 AM

## 2013-04-04 ENCOUNTER — Inpatient Hospital Stay (HOSPITAL_COMMUNITY): Payer: Medicare Other | Admitting: Physical Therapy

## 2013-04-04 ENCOUNTER — Telehealth: Payer: Self-pay | Admitting: *Deleted

## 2013-04-04 ENCOUNTER — Inpatient Hospital Stay (HOSPITAL_COMMUNITY): Payer: Medicare Other | Admitting: Occupational Therapy

## 2013-04-04 ENCOUNTER — Inpatient Hospital Stay (HOSPITAL_COMMUNITY): Payer: Medicare Other

## 2013-04-04 LAB — CBC WITH DIFFERENTIAL/PLATELET
Basophils Absolute: 0 10*3/uL (ref 0.0–0.1)
Basophils Relative: 0 % (ref 0–1)
Eosinophils Absolute: 0.1 10*3/uL (ref 0.0–0.7)
Eosinophils Relative: 2 % (ref 0–5)
Lymphocytes Relative: 8 % — ABNORMAL LOW (ref 12–46)
Lymphs Abs: 0.5 10*3/uL — ABNORMAL LOW (ref 0.7–4.0)
MCHC: 32.5 g/dL (ref 30.0–36.0)
MCV: 95.2 fL (ref 78.0–100.0)
Monocytes Absolute: 0.7 10*3/uL (ref 0.1–1.0)
Neutrophils Relative %: 79 % — ABNORMAL HIGH (ref 43–77)
Platelets: 173 10*3/uL (ref 150–400)
RBC: 2.49 MIL/uL — ABNORMAL LOW (ref 3.87–5.11)
RDW: 17.2 % — ABNORMAL HIGH (ref 11.5–15.5)
WBC: 5.8 10*3/uL (ref 4.0–10.5)

## 2013-04-04 LAB — MAGNESIUM: Magnesium: 1.9 mg/dL (ref 1.5–2.5)

## 2013-04-04 MED ORDER — SACCHAROMYCES BOULARDII 250 MG PO CAPS
500.0000 mg | ORAL_CAPSULE | Freq: Two times a day (BID) | ORAL | Status: DC
  Administered 2013-04-04 – 2013-04-11 (×14): 500 mg via ORAL
  Filled 2013-04-04 (×16): qty 2

## 2013-04-04 MED ORDER — FUROSEMIDE 20 MG PO TABS
20.0000 mg | ORAL_TABLET | Freq: Two times a day (BID) | ORAL | Status: DC
  Administered 2013-04-04 – 2013-04-08 (×8): 20 mg via ORAL
  Filled 2013-04-04 (×12): qty 1

## 2013-04-04 MED ORDER — POTASSIUM CHLORIDE CRYS ER 20 MEQ PO TBCR
20.0000 meq | EXTENDED_RELEASE_TABLET | Freq: Two times a day (BID) | ORAL | Status: DC
  Administered 2013-04-04 – 2013-04-09 (×10): 20 meq via ORAL
  Filled 2013-04-04 (×13): qty 1

## 2013-04-04 NOTE — Progress Notes (Addendum)
INITIAL NUTRITION ASSESSMENT  DOCUMENTATION CODES Per approved criteria  -Severe malnutrition in the context of chronic illness -Obesity   INTERVENTION: Continue Ensure Pudding po TID, each supplement provides 170 kcal and 4 grams of protein.  RD provided diet education regarding "Low Fiber Nutrition Therapy" and "Low Sodium Nutrition Therapy." RD to continue to follow nutrition care plan.  NUTRITION DIAGNOSIS: Inadequate oral intake r/t GI distress AEB weight loss and pt report.  Goal: Intake to meet >90% of estimated nutrition needs.  Monitor:  weight trends, lab trends, I/O's, PO intake, supplement tolerance  Reason for Assessment: MD Consult for Diet Education  79 y.o. female  Admitting Dx: Physical deconditioning  ASSESSMENT: PMHx significant for PAF, CMV colitis with chronic diarrhea. Admitted 10/31 with poor PO's, vomiting and inability to keep foods down. Work-up revealed SBO with incarcerated umbilical hernia. Underwent lab ventral hernia repair with mesh, lysis of adhesions and lap chole with partial omentectomy on 11/3. Diarrhea slowly improving, c diff negative.   Currently ordered for a Heart Healthy diet with "Low Fiber" restrictions. Eating 60-100% of meals. Patient reports GI problems started with diarrhea and poor appetite several months ago.  Weight was 205 lbs in May, this is a weight change of 12% x 6 months and is significant for this time frame. Pt reports that since her admission, her oral intake has greatly improved and diarrhea is still improving. She would like to continue her Ensure Complete supplements.   Pt reports that she follows a low fiber diet at home. We reviewed high fiber and high salt foods to avoid.  Nutrition Focused Physical Exam:  Subcutaneous Fat:  Orbital Region: severe Upper Arm Region: wnl Thoracic and Lumbar Region: moderate  Muscle:  Temple Region: severe Clavicle Bone Region: moderate Clavicle and Acromion Bone Region:  mild Scapular Bone Region: mild Dorsal Hand: wnl Patellar Region: mild Anterior Thigh Region: mild Posterior Calf Region: n/a  Edema: not noted  Patient meets criteria for Severe Malnutrition related to chronic illness AEB 12% weight loss in the past 6 months and intake <75% for > 1 month.   Height: Ht Readings from Last 1 Encounters:  03/29/13 5\' 4"  (1.626 m)    Weight: Wt Readings from Last 1 Encounters:  04/04/13 181 lb (82.1 kg)    Ideal Body Weight: 120 lbs  % Ideal Body Weight: 151%  Wt Readings from Last 30 Encounters:  04/04/13 181 lb (82.1 kg)  04/02/13 181 lb 3.5 oz (82.2 kg)  04/02/13 181 lb 3.5 oz (82.2 kg)  03/18/13 159 lb 12 oz (72.462 kg)  02/26/13 176 lb (79.833 kg)  02/07/13 189 lb 1.6 oz (85.775 kg)  01/27/13 177 lb 4 oz (80.4 kg)  01/27/13 177 lb 4 oz (80.4 kg)  01/22/13 174 lb (78.926 kg)  01/11/13 175 lb 8 oz (79.606 kg)  12/31/12 184 lb 9.6 oz (83.734 kg)  12/21/12 188 lb (85.276 kg)  12/12/12 196 lb 10.4 oz (89.2 kg)  12/12/12 196 lb 10.4 oz (89.2 kg)  12/07/12 195 lb (88.451 kg)  10/10/12 204 lb (92.534 kg)  03/07/12 218 lb (98.884 kg)  03/02/11 230 lb (104.327 kg)  02/22/10 231 lb (104.781 kg)  04/29/09 232 lb (105.235 kg)  02/17/09 237 lb (107.502 kg)  12/26/07 236 lb (107.049 kg)   Usual Body Weight: 205 lbs (in May 2014)  % Usual Body Weight: 88%  BMI:  Body mass index is 31.05 kg/(m^2). Obese Class I  Estimated Nutritional Needs: Kcal: 1600-1800 Protein: 90-100 gms  Fluid: 1.95L daily  Skin:  stage II pressure ulcer to R buttocks  Diet Order: Cardiac with Low Fiber restrictions  EDUCATION NEEDS: -No education needs identified at this time   Intake/Output Summary (Last 24 hours) at 04/04/13 1436 Last data filed at 04/04/13 1327  Gross per 24 hour  Intake    720 ml  Output      0 ml  Net    720 ml    Last BM: 11/13  Labs:     Recent Labs Lab 04/01/13 0420 04/02/13 0455 04/03/13 0500 04/04/13 0500  NA  136 134* 135  --   K 4.2 4.4 4.3  --   CL 110 108 106  --   CO2 20 20 22   --   BUN 8 7 8   --   CREATININE 0.83 0.76 0.75  --   CALCIUM 7.9* 7.8* 7.8*  --   MG 2.1 2.0 1.9 1.9  GLUCOSE 122* 98 111*  --     CBG (last 3)  No results found for this basename: GLUCAP,  in the last 72 hours  Scheduled Meds: . amiodarone  200 mg Oral Daily  . antiseptic oral rinse  15 mL Mouth Rinse q12n4p  . aspirin  81 mg Oral Daily  . atorvastatin  20 mg Oral Q supper  . buPROPion  150 mg Oral Daily  . carvedilol  6.25 mg Oral BID WC  . chlorhexidine  15 mL Mouth Rinse BID  . [START ON 04/06/2013] cyanocobalamin  1,000 mcg Intramuscular Q30 days  . enoxaparin (LOVENOX) injection  40 mg Subcutaneous Q24H  . famotidine  20 mg Oral BID  . feeding supplement (ENSURE)  1 Container Oral TID BM  . ferrous sulfate  325 mg Oral TID WC  . furosemide  20 mg Oral BID  . levothyroxine  125 mcg Oral QAC breakfast  . lip balm   Topical BID  . lisinopril  2.5 mg Oral Daily  . multivitamin with minerals  1 tablet Oral Daily  . mycophenolate  1,000 mg Oral BID  . potassium chloride  20 mEq Oral BID  . protein supplement  1 scoop Oral TID WC  . psyllium  1 packet Oral BID  . saccharomyces boulardii  500 mg Oral BID  . vitamin C  500 mg Oral Daily    Continuous Infusions:    Past Medical History  Diagnosis Date  . Anemia   . Hyperlipemia   . Hypertension   . Thyroid disease     hypothyroidism  . Anxiety   . Paroxysmal atrial fibrillation   . Vitamin B 12 deficiency   . CHF (congestive heart failure)   . TIA (transient ischemic attack)     4-09  . Microscopic hematuria     benign microscopic hematuria, worked up b Dr Alexis Frock  . Gall stone     porcelain gall bladder with single stone  per CT 02-2009 Dr Birdie Sons  . Osteoporosis     DEXA on 04-30-10  . Myasthenia gravis     sees Dr. Terrace Arabia   . Ulcerative colitis   . DVT of leg (deep venous thrombosis)     left leg   . CMV colitis      Past Surgical History  Procedure Laterality Date  . Cardiac catheterization      04/1999  . Abdominal hysterectomy      with oophorectomy  . Colonoscopy  1999    normal   . Colonoscopy  N/A 12/10/2012    Procedure: COLONOSCOPY;  Surgeon: Iva Boop, MD;  Location: [facility];  Service: Endoscopy;  Laterality: N/A;  . Flexible sigmoidoscopy N/A 01/26/2013    Procedure: FLEXIBLE SIGMOIDOSCOPY;  Surgeon: Iva Boop, MD;  Location: [facility];  Service: Endoscopy;  Laterality: N/A;  . Ventral hernia repair N/A 03/25/2013    Procedure: LAPAROSCOPIC VENTRAL HERNIA REPAIR ;  Surgeon: Ardeth Sportsman, MD;  Location: [facility];  Service: General;  Laterality: N/A;  . Cholecystectomy N/A 03/25/2013    Procedure: LAPAROSCOPIC CHOLECYSTECTOMY WITH INTRAOPERATIVE CHOLANGIOGRAM;  Surgeon: Ardeth Sportsman, MD;  Location: [facility];  Service: General;  Laterality: N/A;  . Insertion of mesh N/A 03/25/2013    Procedure: INSERTION OF MESH;  Surgeon: Ardeth Sportsman, MD;  Location: [facility];  Service: General;  Laterality: N/A;  . Laparoscopic lysis of adhesions N/A 03/25/2013    Procedure: LAPAROSCOPIC LYSIS OF ADHESIONS;  Surgeon: Ardeth Sportsman, MD;  Location: [facility];  Service: General;  Laterality: N/A;  . Omentectomy N/A 03/25/2013    Procedure: PARTIAL OMENTECTOMY;  Surgeon: Ardeth Sportsman, MD;  Location: [facility];  Service: General;  Laterality: N/A;   Jarold Motto MS, RD, LDN Pager: [phone] After-hours pager: [phone]

## 2013-04-04 NOTE — Progress Notes (Signed)
Physical Therapy Session Note  Patient Details  Name: [name] MRN: [id number] Date of Birth: September 12, 1924  Today's Date: 04/04/2013 Time: 0800-0900 session one            1400-1435 session two Time Calculation (min):   70 min session one                                               35 min session two  Short Term Goals: Week 1:  PT Short Term Goal 1 (Week 1): Pt to perform bed mobility w/ supervision PT Short Term Goal 2 (Week 1): Pt to perform t/f sit<>stand from standard height chair w/ supervision PT Short Term Goal 3 (Week 1): Pt to propel w/c 100' w/ supervision PT Short Term Goal 4 (Week 1): Pt to amb 18' w/ RW and supervision  Skilled Therapeutic Interventions/Progress Updates:    Pt seen in two sessions:  Session one:   Pt received supine in bed, reports mild overall general pain. Vitals monitored throughout treatment session as below. Pt TED hose and abdominal binder donned with max A, supine to sit with mod A with use of bed rails, sit EOB with supervision assist, stand pivot to BSC  x 2 with min A and pt reporting sudden urgency for bowel movement x 2 c/o diarrhea. Nsg aware and vitals reported to nsg. Sit to stand from w/c x 3 with min A with no c/o dizziness. Pt left with nsg at end of session.  Session two:  Pt received sitting in w/c. Wheelchair mobility with supervision A for 65 feet then pt c/o fatigue. PT performed standing therex  2 x 10 reps and sidestepping in //bars with 4 rests required due to reports of fatigue, monitored vitals as below. Gait  Training with RW for 35' x 2 with vcs to improve upright posture.   Therapy Documentation Precautions:  Precautions Precautions: Fall Precaution Comments: monitor BP, previously patient has been orthostatic Restrictions Weight Bearing Restrictions: No    Vital Signs: Session one   In supine: BP  89/57 SpO2 98% 65 bpm  In sitting EOB with abdominal binder: BP 101/66 65 bpm  Sitting in w/c with abdominal  binder : BP 117/72  65 bpm Session two:  Sitting with abdominal binder BP  96/58 HR 69 bpm  After standing in // bars; BP 89/63 HR 74     Pain: General 2/10 both sessions      Locomotion : Ambulation Ambulation/Gait Assistance: 4: Min assist                See FIM for current functional status  Therapy/Group: Individual Therapy both sessions  Jackelyn Knife 04/04/2013, 3:32 PM

## 2013-04-04 NOTE — Telephone Encounter (Signed)
I called [name] to let her know she has been approved for her Valcyte.  She is in the hospital.  Her son-in-law said he will have his wife call me back concerning the medication.  Seems she may have been removed from it due to side effects.

## 2013-04-04 NOTE — Progress Notes (Signed)
Subjective/Complaints: Had a reasonable. Had 3 bm's but was able to fall back to sleep after each. A 12 point review of systems has been performed and if not noted above is otherwise negative.   Objective: Vital Signs: Blood pressure 113/58, pulse 63, temperature 98.8 F (37.1 C), temperature source Oral, resp. rate 18, weight 82.1 kg (181 lb), SpO2 100.00%. No results found.  Recent Labs  04/03/13 0500 04/04/13 0500  WBC 9.3 5.8  HGB 8.2* 7.7*  HCT 24.8* 23.7*  PLT 171 173    Recent Labs  04/02/13 0455 04/03/13 0500  NA 134* 135  K 4.4 4.3  CL 108 106  GLUCOSE 98 111*  BUN 7 8  CREATININE 0.76 0.75  CALCIUM 7.8* 7.8*   CBG (last 3)  No results found for this basename: GLUCAP,  in the last 72 hours  Wt Readings from Last 3 Encounters:  04/04/13 82.1 kg (181 lb)  04/02/13 82.2 kg (181 lb 3.5 oz)  04/02/13 82.2 kg (181 lb 3.5 oz)    Physical Exam:  Constitutional: She appears well-developed and well-nourished. No distress  HENT: oral mucosa pink and moist, dentition fair/good  Head: Normocephalic.  Eyes: EOM are normal. Pupils are equal, round, and reactive to light.  Neck: Normal range of motion. Neck supple. No JVD present. No tracheal deviation present. No thyromegaly present.  Cardiovascular: Normal rate.  No murmur heard. No rubs, gallops  Respiratory: Effort normal. No respiratory distress. No wheezes  GI: She exhibits no distension. There is tenderness. Small incision sites well healed over abdomen. BS+ Musculoskeletal:  Edema tr in LE's, tr  in UE's.  Homan's -, left knee a little tender with ROM.  Neurological: She is alert. No cranial nerve deficit. Coordination normal.  UE's 4/5. LE's 3- HF, 3/5 KE, 4/5 distally at feet. No gross sensory deficits.  Psychiatric: She has a normal mood and affect. Her behavior is normal. Judgment and thought content normal.  Skin: mild gluteal breakdown noted   Assessment/Plan: 1. Functional deficits secondary to  deconditioning related to an incarcerated small bowel, s/p lap chole and lysis of adhsesions which require 3+ hours per day of interdisciplinary therapy in a comprehensive inpatient rehab setting. Physiatrist is providing close team supervision and 24 hour management of active medical problems listed below. Physiatrist and rehab team continue to assess barriers to discharge/monitor patient progress toward functional and medical goals. FIM: FIM - Bathing Bathing Steps Patient Completed: Chest;Right Arm;Left Arm;Abdomen Bathing: 2: Max-Patient completes 3-4 84f 10 parts or 25-49%  FIM - Upper Body Dressing/Undressing Upper body dressing/undressing: 0: Wears gown/pajamas-no public clothing FIM - Lower Body Dressing/Undressing Lower body dressing/undressing: 0: Activity did not occur  FIM - Toileting Toileting steps completed by patient: Adjust clothing prior to toileting;Adjust clothing after toileting (wearing hospital gown) Toileting Assistive Devices: Grab bar or rail for support Toileting: 6: Assistive device: No helper  FIM - Diplomatic Services operational officer Devices: Building control surveyor Transfers: 4-To toilet/BSC: Min A (steadying Pt. > 75%);4-From toilet/BSC: Min A (steadying Pt. > 75%)  FIM - Bed/Chair Transfer Bed/Chair Transfer Assistive Devices: Manufacturing systems engineer Transfer: 3: Supine > Sit: Mod A (lifting assist/Pt. 50-74%/lift 2 legs;3: Sit > Supine: Mod A (lifting assist/Pt. 50-74%/lift 2 legs);4: Bed > Chair or W/C: Min A (steadying Pt. > 75%);4: Chair or W/C > Bed: Min A (steadying Pt. > 75%)  FIM - Locomotion: Ambulation Locomotion: Ambulation Assistive Devices: Walker - Rolling Ambulation/Gait Assistance: 4: Min assist Locomotion: Ambulation: 1: Travels less than  50 ft with minimal assistance (Pt.>75%)  Comprehension Comprehension Mode: Auditory Comprehension: 6-Follows complex conversation/direction: With extra time/assistive  device  Expression Expression Mode: Verbal Expression: 6-Expresses complex ideas: With extra time/assistive device  Social Interaction Social Interaction: 6-Interacts appropriately with others with medication or extra time (anti-anxiety, antidepressant).  Problem Solving Problem Solving: 6-Solves complex problems: With extra time  Memory Memory: 6-More than reasonable amt of time  Medical Problem List and Plan:  Deconditioning due to incarcerated hernia with SBO, cholelithiasis with acute and chronically inflamed GB--requiring L, 1. DVT Prophylaxis/Anticoagulation: Pharmaceutical: Lovenox  2. Pain Management: tylenol for now. No substantial pain at present. Local remedies if possible also  3. Mood: Continue bupropion daily. Motivated to get better--LCSW to follow for evaluation.  4. Neuropsych: This patient is capable of making decisions on her own behalf.  5. 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 Monitor lytes with routine check. Continue probiotics and mycophenolate. Adjust regimen as needed.  -low fiber diet  6. NSTEMI due to demand ischemia:  7. ABLA: down to 7.7 which is within range for her (7.5-8.2). Will manage conservatively for now  -Continue iron supplement  -serial hgbs==check friday 8. A Fib: In NSR on amiodarone and coreg. Continue to monitor heart rate on bid basis.  9. Cardiomyopathy:   weights balanced. Low salt diet to avoid recurrent volume overload. On coreg, Lipitor and lisinopril.  -I's O's + yesterday---follow closely, edema better  -check orthostatic BP's and adjust medications as indicated.     LOS (Days) 2 A FACE TO FACE EVALUATION WAS PERFORMED  SWARTZ,ZACHARY T 04/04/2013 8:38 AM

## 2013-04-04 NOTE — IPOC Note (Signed)
Overall Plan of Care [facility]) Patient Details Name: [name] MRN: [id number] DOB: [birth year]  Admitting Diagnosis: Deconditioned after MI  Hospital Problems: Principal Problem:   Physical deconditioning Active Problems:   Incisional hernia, incarcerated s/p lap repair 03/25/2013   NSTEMI (non-ST elevated myocardial infarction)   Anemia of chronic disease   Pedal edema     Functional Problem List: Nursing Bowel;Edema;Endurance;Medication Management;Nutrition  PT Balance;Endurance;Edema;Other (comment) (strength)  OT Balance;Edema;Endurance;Motor;Pain;Perception;Safety;Skin Integrity  SLP    TR         Basic ADL's: OT Grooming;Bathing;Dressing;Toileting     Advanced  ADL's: OT  (n/a at this time)     Transfers: PT Bed Mobility;Bed to Chair;Car;Furniture  OT Toilet;Tub/Shower     Locomotion: PT Ambulation;Wheelchair Mobility;Stairs     Additional Impairments: OT None (functional strengthening of BUEs)  SLP        TR      Anticipated Outcomes Item Anticipated Outcome  Self Feeding mod I (secondary to dentures)  Swallowing      Basic self-care  supervision  Toileting  supervision   Bathroom Transfers supervision  Bowel/Bladder  Managed bowel with Mod I and bladder independently on toilet  Transfers  Mod(I)-Supervision  Locomotion  Mod(I)  Communication     Cognition     Pain  n/a  Safety/Judgment  n/a   Therapy Plan: PT Intensity: Minimum of 1-2 x/day ,45 to 90 minutes PT Frequency: 5 out of 7 days PT Duration Estimated Length of Stay: 12-14 days OT Intensity: Minimum of 1-2 x/day, 45 to 90 minutes OT Frequency: 5 out of 7 days OT Duration/Estimated Length of Stay: 12-14 days         Team Interventions: Nursing Interventions Patient/Family Education;Medication Management;Discharge Planning;Bowel Management;Skin Care/Wound Management;Disease Management/Prevention  PT interventions Ambulation/gait training;Balance/vestibular  training;Discharge planning;DME/adaptive equipment instruction;Functional mobility training;Neuromuscular re-education;Patient/family education;Skin care/wound management;Stair training;Therapeutic Activities;Therapeutic Exercise;UE/LE Strength taining/ROM;UE/LE Coordination activities;Wheelchair propulsion/positioning  OT Interventions Warden/ranger;Therapeutic Exercise;UE/LE Coordination activities;Self Care/advanced ADL retraining;UE/LE Strength taining/ROM;Therapeutic Activities;Functional mobility training;DME/adaptive equipment instruction;Discharge planning;Patient/family education;Community reintegration;Pain management;Skin care/wound managment;Psychosocial support  SLP Interventions    TR Interventions    SW/CM Interventions Discharge Planning;Psychosocial Support;Patient/Family Education    Team Discharge Planning: Destination: PT-  ,OT- Home , SLP-  Projected Follow-up: PT-Home health PT;24 hour supervision/assistance, OT-  Home health OT, SLP-  Projected Equipment Needs: PT-None recommended by PT, OT-  , SLP-  Patient/family involved in discharge planning: PT- Patient,  OT-Patient, SLP-   MD ELOS: 12-14 days Medical Rehab Prognosis:  Excellent Assessment: The patient has been admitted for CIR therapies. The team will be addressing, functional mobility, strength, stamina, balance, safety, adaptive techniques/equipment, self-care, bowel and bladder mgt, patient and caregiver education, pain control. Goals have been set at supervision to mod I for mobility and self-care. She is quite motivated.    Ranelle Oyster, MD, FAAPMR      See Team Conference Notes for weekly updates to the plan of care

## 2013-04-04 NOTE — Progress Notes (Signed)
Physical Therapy Session Note  Patient Details  Name: [name] MRN: [id number] Date of Birth: [birth year]  Today's Date: 04/04/2013 Time: 1135-1200 Time Calculation (min): 25 min  Short Term Goals: Week 1:  PT Short Term Goal 1 (Week 1): Pt to perform bed mobility w/ supervision PT Short Term Goal 2 (Week 1): Pt to perform t/f sit<>stand from standard height chair w/ supervision PT Short Term Goal 3 (Week 1): Pt to propel w/c 100' w/ supervision PT Short Term Goal 4 (Week 1): Pt to amb 56' w/ RW and supervision  Skilled Therapeutic Interventions/Progress Updates:   Pt prefers to stay in room due to multiple loose BM's this morning. Focused on sit to stands and LE therex for functional strengthening including LAQ, standing marches, and mini squats x 10 reps each BLE with seated rest breaks in between. Pt overall steady A with sit to stands with RW.  Therapy Documentation Precautions:  Precautions Precautions: Fall Precaution Comments: monitor BP, previously patient has been orthostatic Restrictions Weight Bearing Restrictions: No  Pain: Pain Assessment Pain Assessment: 0-10 Pain Score: 0-No pain  See FIM for current functional status  Therapy/Group: Individual Therapy  Karolee Stamps [facility] 04/04/2013, 12:14 PM

## 2013-04-04 NOTE — Progress Notes (Signed)
Occupational Therapy Session Note  Patient Details  Name: [name] MRN: [id number] Date of Birth: [birth year]  Today's Date: 04/04/2013 Time: 1035-1130 Time Calculation (min): 55 min  Short Term Goals: Week 1:  OT Short Term Goal 1 (Week 1): Patient will complete UB/LB bathing with minimal assistance OT Short Term Goal 2 (Week 1): Patient will complete LB dressing with minimal assistance OT Short Term Goal 3 (Week 1): Patient will complete toileting tasks (peri care & clothing management) with moderate assistance OT Short Term Goal 4 (Week 1): Patient will complete shower stall transfers with minimal assistance  Skilled Therapeutic Interventions/Progress Updates:  Patient found seated in w/c beside bed with TEDs and abdominal binder donned. Patient with no complaints of pain. Therapist assisted patient to sink and patient completed grooming task of brushing teeth. BP checked = 101/55. Therapist doffed abdominal binder for bathing and BP=97/47. Patient completed UB bathing & dressing (including donning of abdominal binder) while seated in w/c; BP checked=118/53. Patient then stood to complete LB bathing. Patient stood ~2 minutes, then sat feeling dizzy; BP checked=114/54. Patient donned underwear from here. Stood to pull them up and BP checked=108/53. Patient donned pants and stood to pull them up, once patient sat down BP=118/52. Patient with difficult time donning pants, plan on introducing AE to increase independence with LB tasks. After ADL, patient sat in w/c with bilateral leg rests donned and therapist educated patient on bicep flexion exercises. Patient with weak right shoulder during exercises, recommended patient use body weight for shoulder strengthening exercises. Patient's ROM is [facility] for functional tasks, but she has difficult time due to weakness with strengthening exercises. At end of session, left patient seated in w/c beside bed with needed items within reach.   Precautions:   Precautions Precautions: Fall Precaution Comments: monitor BP, previously patient has been orthostatic Restrictions Weight Bearing Restrictions: No  See FIM for current functional status  Therapy/Group: Individual Therapy  [name] 04/04/2013, 12:06 PM

## 2013-04-05 ENCOUNTER — Inpatient Hospital Stay (HOSPITAL_COMMUNITY): Payer: Medicare Other | Admitting: Physical Therapy

## 2013-04-05 ENCOUNTER — Inpatient Hospital Stay (HOSPITAL_COMMUNITY): Payer: Medicare Other | Admitting: Occupational Therapy

## 2013-04-05 ENCOUNTER — Inpatient Hospital Stay (HOSPITAL_COMMUNITY): Payer: Medicare Other

## 2013-04-05 LAB — COMPREHENSIVE METABOLIC PANEL
ALT: 8 U/L (ref 0–35)
AST: 17 U/L (ref 0–37)
Albumin: 2 g/dL — ABNORMAL LOW (ref 3.5–5.2)
Alkaline Phosphatase: 46 U/L (ref 39–117)
Chloride: 108 mEq/L (ref 96–112)
GFR calc Af Amer: 71 mL/min — ABNORMAL LOW (ref >90)
GFR calc non Af Amer: 61 mL/min — ABNORMAL LOW (ref >90)
Glucose, Bld: 98 mg/dL (ref 70–99)
Potassium: 4.4 mEq/L (ref 3.5–5.1)
Total Bilirubin: 0.2 mg/dL — ABNORMAL LOW (ref 0.3–1.2)

## 2013-04-05 LAB — CBC WITH DIFFERENTIAL/PLATELET
Basophils Absolute: 0 10*3/uL (ref 0.0–0.1)
Basophils Relative: 0 % (ref 0–1)
Hemoglobin: 7.4 g/dL — ABNORMAL LOW (ref 12.0–15.0)
Lymphocytes Relative: 12 % (ref 12–46)
MCHC: 33 g/dL (ref 30.0–36.0)
Neutro Abs: 3.5 10*3/uL (ref 1.7–7.7)
Neutrophils Relative %: 71 % (ref 43–77)
RDW: 17.2 % — ABNORMAL HIGH (ref 11.5–15.5)
WBC: 4.9 10*3/uL (ref 4.0–10.5)

## 2013-04-05 LAB — PREPARE RBC (CROSSMATCH)

## 2013-04-05 LAB — ABO/RH: ABO/RH(D): B POS

## 2013-04-05 MED ORDER — ACETAMINOPHEN 325 MG PO TABS
650.0000 mg | ORAL_TABLET | Freq: Once | ORAL | Status: AC
Start: 2013-04-05 — End: 2013-04-05
  Administered 2013-04-05: 650 mg via ORAL
  Filled 2013-04-05: qty 2

## 2013-04-05 MED ORDER — LOPERAMIDE HCL 2 MG PO CAPS
2.0000 mg | ORAL_CAPSULE | Freq: Two times a day (BID) | ORAL | Status: DC
  Administered 2013-04-05 – 2013-04-11 (×11): 2 mg via ORAL
  Filled 2013-04-05 (×15): qty 1

## 2013-04-05 MED ORDER — DIPHENHYDRAMINE HCL 25 MG PO CAPS
25.0000 mg | ORAL_CAPSULE | Freq: Once | ORAL | Status: AC
  Administered 2013-04-05: 25 mg via ORAL
  Filled 2013-04-05: qty 1

## 2013-04-05 MED ORDER — FUROSEMIDE 10 MG/ML IJ SOLN
20.0000 mg | Freq: Once | INTRAMUSCULAR | Status: DC
  Filled 2013-04-05: qty 2

## 2013-04-05 NOTE — Progress Notes (Addendum)
Physical Therapy Session Note  Patient Details  Name: [name] MRN: [id number] Date of Birth: [birth year]  Today's Date: 04/05/2013 Time: 0730-0827 Time Calculation (min): 57 min  Short Term Goals: Week 1:  PT Short Term Goal 1 (Week 1): Pt to perform bed mobility w/ supervision PT Short Term Goal 2 (Week 1): Pt to perform t/f sit<>stand from standard height chair w/ supervision PT Short Term Goal 3 (Week 1): Pt to propel w/c 100' w/ supervision PT Short Term Goal 4 (Week 1): Pt to amb 9' w/ RW and supervision  Skilled Therapeutic Interventions/Progress Updates:    Pt finishing up using bathroom with nursing upon entry. Returned to supine with mod assist for bil. LE management. PT donned TEDs and abdominal binder, BP 120/68 checked halfway through session. Ambulation x 60' with RW and min assist bil. Trendelenburg noted (pelvic/hip weakness), followed with wheelchair. Wheelchair propulsion x 100', 30' over tiled and carpeted surfaces with supervision performed for endurance, pt fatigued by activity but pushed herself. Stairs x 3 with bil. Railing, min assist cues for sequencing. Step up/downs for strengthening 4 x 5 reps. Standing endurance/balance while watering plants, RW for support min assist 4.5 min continuous standing. Pt set-up with breakfast call bell and all needs within reach.   Goals updated to reflect anticipated progress.   Therapy Documentation Precautions:  Precautions Precautions: Fall Precaution Comments: monitor BP, previously patient has been orthostatic Restrictions Weight Bearing Restrictions: No Vital Signs: Pain: Pain Assessment Pain Assessment: No/denies pain  See FIM for current functional status  Therapy/Group: Individual Therapy  Wilhemina Bonito 04/05/2013, 9:49 AM

## 2013-04-05 NOTE — Progress Notes (Signed)
Physical Therapy Session Note  Patient Details  Name: [name] MRN: [id number] Date of Birth: Feb 01, 1925  Today's Date: 04/05/2013 Time: 1324-4010 Time Calculation (min): 10 min  Short Term Goals: Week 1:  PT Short Term Goal 1 (Week 1): Pt to perform bed mobility w/ supervision PT Short Term Goal 2 (Week 1): Pt to perform t/f sit<>stand from standard height chair w/ supervision PT Short Term Goal 3 (Week 1): Pt to propel w/c 100' w/ supervision PT Short Term Goal 4 (Week 1): Pt to amb 61' w/ RW and supervision  Skilled Therapeutic Interventions/Progress Updates:   Pt awaiting blood transfusion and on bedside therapies per PA until then. Upon entering room, pt had just finished transfer from [facility] back to bed and reporting feeling dizzy after transfer. Assisted pt to reposition in supine and BP taken (see below). BP increased since earlier therapy session, but pt still reports feeling lightheaded and not up to participating in bed level therapies. Pt missed 35 minutes of skilled PT and RN aware of vitals.   Therapy Documentation Precautions:  Precautions Precautions: Fall Precaution Comments: monitor BP, previously patient has been orthostatic Restrictions Weight Bearing Restrictions: No General: Amount of Missed PT Time (min): 35 Minutes Missed Time Reason: Patient on bedrest;Other (comment) Vital Signs: Therapy Vitals Pulse Rate: 58 BP: 109/63 mmHg Patient Position, if appropriate: Lying Pain:  Denies pain.  See FIM for current functional status  Therapy/Group: Individual Therapy  Karolee Stamps [facility] 04/05/2013, 3:05 PM

## 2013-04-05 NOTE — Progress Notes (Addendum)
Subjective/Complaints: Stools better but still fairly frequent and Wake her up at night.  A 12 point review of systems has been performed and if not noted above is otherwise negative.   Objective: Vital Signs: Blood pressure 86/57, pulse 68, temperature 96.6 F (35.9 C), temperature source Oral, resp. rate 18, weight 79.697 kg (175 lb 11.2 oz), SpO2 100.00%. No results found.  Recent Labs  04/04/13 0500 04/05/13 0505  WBC 5.8 4.9  HGB 7.7* 7.4*  HCT 23.7* 22.4*  PLT 173 165    Recent Labs  04/03/13 0500 04/05/13 0505  NA 135 139  K 4.3 4.4  CL 106 108  GLUCOSE 111* 98  BUN 8 8  CREATININE 0.75 0.84  CALCIUM 7.8* 7.5*   CBG (last 3)  No results found for this basename: GLUCAP,  in the last 72 hours  Wt Readings from Last 3 Encounters:  04/05/13 79.697 kg (175 lb 11.2 oz)  04/02/13 82.2 kg (181 lb 3.5 oz)  04/02/13 82.2 kg (181 lb 3.5 oz)    Physical Exam:  Constitutional: She appears well-developed and well-nourished. No distress  HENT: oral mucosa pink and moist, dentition fair/good  Head: Normocephalic.  Eyes: EOM are normal. Pupils are equal, round, and reactive to light.  Neck: Normal range of motion. Neck supple. No JVD present. No tracheal deviation present. No thyromegaly present.  Cardiovascular: Normal rate.  No murmur heard. No rubs, gallops  Respiratory: Effort normal. No respiratory distress. No wheezes  GI: She exhibits no distension. There is tenderness. Small incision sites well healed over abdomen. BS+ Musculoskeletal:  Edema tr in LE's, tr  in UE's.  Homan's -, left knee a little tender with ROM.  Neurological: She is alert. No cranial nerve deficit. Coordination normal.  UE's 4/5. LE's 3- HF, 3/5 KE, 4/5 distally at feet. No gross sensory deficits.  Psychiatric: She has a normal mood and affect. Her behavior is normal. Judgment and thought content normal.  Skin: mild gluteal breakdown noted   Assessment/Plan: 1. Functional deficits  secondary to deconditioning related to an incarcerated small bowel, s/p lap chole and lysis of adhsesions which require 3+ hours per day of interdisciplinary therapy in a comprehensive inpatient rehab setting. Physiatrist is providing close team supervision and 24 hour management of active medical problems listed below. Physiatrist and rehab team continue to assess barriers to discharge/monitor patient progress toward functional and medical goals. FIM: FIM - Bathing Bathing Steps Patient Completed: Chest;Right Arm;Left Arm;Abdomen;Front perineal area;Right upper leg;Left upper leg Bathing: 3: Mod-Patient completes 5-7 14f 10 parts or 50-74%  FIM - Upper Body Dressing/Undressing Upper body dressing/undressing steps patient completed: Thread/unthread right sleeve of pullover shirt/dresss;Thread/unthread left sleeve of pullover shirt/dress;Put head through opening of pull over shirt/dress;Pull shirt over trunk Upper body dressing/undressing: 5: Supervision: Safety issues/verbal cues FIM - Lower Body Dressing/Undressing Lower body dressing/undressing: 1: Total-Patient completed less than 25% of tasks  FIM - Toileting Toileting steps completed by patient: Adjust clothing prior to toileting;Adjust clothing after toileting (wearing hospital gown) Toileting Assistive Devices: Grab bar or rail for support Toileting: 6: Assistive device: No helper  FIM - Diplomatic Services operational officer Devices: Building control surveyor Transfers: 4-To toilet/BSC: Min A (steadying Pt. > 75%);4-From toilet/BSC: Min A (steadying Pt. > 75%)  FIM - Bed/Chair Transfer Bed/Chair Transfer Assistive Devices: Bed rails;Arm rests;Walker Bed/Chair Transfer: 3: Supine > Sit: Mod A (lifting assist/Pt. 50-74%/lift 2 legs;4: Sit > Supine: Min A (steadying pt. > 75%/lift 1 leg);4: Bed > Chair or W/C:  Min A (steadying Pt. > 75%)  FIM - Locomotion: Wheelchair Locomotion: Wheelchair: 2: Travels 50 - 149 ft with  supervision, cueing or coaxing FIM - Locomotion: Ambulation Locomotion: Ambulation Assistive Devices: Designer, industrial/product Ambulation/Gait Assistance: 4: Min assist Locomotion: Ambulation: 1: Travels less than 50 ft with minimal assistance (Pt.>75%)  Comprehension Comprehension Mode: Auditory Comprehension: 6-Follows complex conversation/direction: With extra time/assistive device  Expression Expression Mode: Verbal Expression: 6-Expresses complex ideas: With extra time/assistive device  Social Interaction Social Interaction: 6-Interacts appropriately with others with medication or extra time (anti-anxiety, antidepressant).  Problem Solving Problem Solving: 6-Solves complex problems: With extra time  Memory Memory: 6-More than reasonable amt of time  Medical Problem List and Plan:  Deconditioning due to incarcerated hernia with SBO, cholelithiasis with acute and chronically inflamed GB--requiring L, 1. DVT Prophylaxis/Anticoagulation: Pharmaceutical: Lovenox  2. Pain Management: tylenol for now. No substantial pain at present. Local remedies if possible also  3. Mood: Continue bupropion daily. Motivated to get better--LCSW to follow for evaluation.  4. Neuropsych: This patient is capable of making decisions on her own behalf.  5. 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 Monitor lytes with routine check. Continue probiotics and mycophenolate. Adjust regimen as needed.  -low fiber diet  6. NSTEMI due to demand ischemia:  7. ABLA: hgb down to 7.4 which is close to her range over the last week  -no outward signs of bleeding  -Continue iron supplement  -serial hgbs 8. A Fib: In NSR on amiodarone and coreg. Continue to monitor heart rate on bid basis.  9. Cardiomyopathy:   weights balanced. Low salt diet to avoid recurrent volume overload. On coreg, Lipitor and lisinopril.  -I's O's + yesterday---follow closely, edema better  -check orthostatic BP's  and adjust medications as indicated.     LOS (Days) 3 A FACE TO FACE EVALUATION WAS PERFORMED  Natthew Marlatt T 04/05/2013 8:29 AM

## 2013-04-05 NOTE — Plan of Care (Signed)
Problem: RH SKIN INTEGRITY Goal: RH STG MAINTAIN SKIN INTEGRITY WITH ASSISTANCE STG Maintain Skin Integrity With Min Assistance.  Outcome: Not Progressing Stage 2 on sacrum     

## 2013-04-05 NOTE — Progress Notes (Signed)
Occupational Therapy Session Note  Patient Details  Name: [name] MRN: [id number] Date of Birth: 11/01/1924  Today's Date: 04/05/2013 Time: 1035-1100 Time Calculation (min): 25 min Missed 35 minutes secondary to fatigue and low BP  Short Term Goals: Week 1:  OT Short Term Goal 1 (Week 1): Patient will complete UB/LB bathing with minimal assistance OT Short Term Goal 2 (Week 1): Patient will complete LB dressing with minimal assistance OT Short Term Goal 3 (Week 1): Patient will complete toileting tasks (peri care & clothing management) with moderate assistance OT Short Term Goal 4 (Week 1): Patient will complete shower stall transfers with minimal assistance  Skilled Therapeutic Interventions/Progress Updates:  Upon entering room, patient found seated in w/c with abdominal binder and bilateral thigh high TED hose donned. Patient willing to get in shower with therapy. Patient stated she was feeling fine, just a little fatigued. Therapist checked BP=88/51, rechecked =85/48. Patient with pale looking skin. Therapist assisted patient back to bed. BP=108/47 39* of supine and 104/47 at 30* of supine. Once in supine patient had a little more color in face. Therapist notified RN and PA of this. All BP checked with abdominal binder and TEDs donned. Secondary to low BP, patient missed 35 minutes of skilled therapy. Left patient in supine position with all needed items within reach.   Precautions:  Precautions Precautions: Fall Precaution Comments: monitor BP, previously patient has been orthostatic Restrictions Weight Bearing Restrictions: No  See FIM for current functional status  Therapy/Group: Individual Therapy  Cliffton Spradley 04/05/2013, 11:09 AM

## 2013-04-05 NOTE — Progress Notes (Signed)
Patient with orthostatic symptoms.  Had binder and TEDs but got light headed with activity this am.  Continues to  Have symptomatic anemia.  Will hold lisinopril. Transfuse with 2 units today. Recheck CBC in am.

## 2013-04-05 NOTE — Plan of Care (Signed)
Problem: RH SKIN INTEGRITY Goal: RH STG SKIN FREE OF INFECTION/BREAKDOWN With Min A  Outcome: Not Progressing Stage 2 on sacrum at this time

## 2013-04-05 NOTE — Progress Notes (Signed)
Physical Therapy Session Note  Patient Details  Name: [name] MRN: [id number] Date of Birth: 1924/08/23  Today's Date: 04/05/2013 Time: 1330-1340 Time Calculation (min): 10 min  Short Term Goals: Week 1:  PT Short Term Goal 1 (Week 1): Pt to perform bed mobility w/ supervision PT Short Term Goal 2 (Week 1): Pt to perform t/f sit<>stand from standard height chair w/ supervision PT Short Term Goal 3 (Week 1): Pt to propel w/c 100' w/ supervision PT Short Term Goal 4 (Week 1): Pt to amb 40' w/ RW and supervision  Skilled Therapeutic Interventions/Progress Updates:    Pt received supine in bed with nsg present. Per nsg report pt continues with low BP and per PA pt to be on bedrest awaiting blood transfusion may participate in bed activitities if vitals stable and pt able. Assessed pt vitals as indicated below to assess pt ability to participate in low resistive supine exs, pt reports feeling dizzy and concerned about moving at all. Pt missed 20 mins of scheduled skilled PT.  Consulted with PT to update on current status as pt is scheduled for therapy in later session.   Therapy Documentation Precautions:  Precautions Precautions: Fall Precaution Comments: monitor BP, previously patient has been orthostatic Restrictions Weight Bearing Restrictions: No General: Amount of Missed PT Time (min):  (20 minutes) Missed Time Reason: Patient on bedrest;Other (comment) (Low BP) Vital Signs: Therapy Vitals Pulse Rate: 56 BP: 88/47 mmHg Patient Position, if appropriate: Lying Pain: discomfort stomach                       See FIM for current functional status  Therapy/Group: Individual Therapy  Jackelyn Knife 04/05/2013, 1:50 PM

## 2013-04-06 ENCOUNTER — Inpatient Hospital Stay (HOSPITAL_COMMUNITY): Payer: Medicare Other | Admitting: *Deleted

## 2013-04-06 ENCOUNTER — Inpatient Hospital Stay (HOSPITAL_COMMUNITY): Payer: Medicare Other | Admitting: Physical Therapy

## 2013-04-06 ENCOUNTER — Inpatient Hospital Stay (HOSPITAL_COMMUNITY): Payer: Medicare Other | Admitting: Occupational Therapy

## 2013-04-06 DIAGNOSIS — R5381 Other malaise: Secondary | ICD-10-CM

## 2013-04-06 DIAGNOSIS — D638 Anemia in other chronic diseases classified elsewhere: Secondary | ICD-10-CM

## 2013-04-06 DIAGNOSIS — I214 Non-ST elevation (NSTEMI) myocardial infarction: Secondary | ICD-10-CM

## 2013-04-06 LAB — TYPE AND SCREEN: Unit division: 0

## 2013-04-06 LAB — CBC WITH DIFFERENTIAL/PLATELET
Basophils Absolute: 0 10*3/uL (ref 0.0–0.1)
Basophils Relative: 0 % (ref 0–1)
Hemoglobin: 9.4 g/dL — ABNORMAL LOW (ref 12.0–15.0)
MCH: 30.8 pg (ref 26.0–34.0)
MCHC: 33.8 g/dL (ref 30.0–36.0)
Monocytes Absolute: 0.6 10*3/uL (ref 0.1–1.0)
Monocytes Relative: 12 % (ref 3–12)
Neutro Abs: 3.6 10*3/uL (ref 1.7–7.7)
Neutrophils Relative %: 72 % (ref 43–77)
Platelets: 171 10*3/uL (ref 150–400)
WBC: 5 10*3/uL (ref 4.0–10.5)

## 2013-04-06 NOTE — Progress Notes (Signed)
Occupational Therapy Note  Patient Details  Name: Floy Angert MRN: [id number] Date of Birth: Apr 05, 1925 Today's Date: 04/06/2013 Time:  1300-1400  (60 min) Pain:  Grumbling, bloated stomach, dizzy Individual session   Pt. Sitting in wc upon OT arrival.  BP= 110/70; HR= 60; O2 sat- 98 %.  Addressed functional mobility, toileting, transfers.  Pt. Ambulated with RW to toilet with SBA.Marland Kitchen Doff pants with SBA.  Performed peri care after having BM.  Ambulated back to wc.  Packed bags to move to 4 [place].  Did UE exercises with 2 # horizontal bar. Left pt in wc with call bell,phone within reach.     Humberto Seals 04/06/2013, 1:09 PM

## 2013-04-06 NOTE — Progress Notes (Signed)
Patient ID: [name], female   DOB: 22-Feb-1925, 79 y.o.   MRN: [id number]   Subjective/Complaints:  11/15.  79 y/o admit  With deconditioning following incarcerated bowel and SBO; h/o CAF, s/p NSTEMI and compensated systolic HF;  Hgh 9.4 from 7.4 yesterday Alert-no complaints. A 12 point review of systems has been performed and if not noted above is otherwise negative.   Objective: Vital Signs: Blood pressure 95/44, pulse 59, temperature 98 F (36.7 C), temperature source Oral, resp. rate 18, weight 174 lb 9.6 oz (79.198 kg), SpO2 99.00%. No results found.  Recent Labs  04/05/13 0505 04/06/13 0450  WBC 4.9 5.0  HGB 7.4* 9.4*  HCT 22.4* 27.8*  PLT 165 171    Recent Labs  04/05/13 0505  NA 139  K 4.4  CL 108  GLUCOSE 98  BUN 8  CREATININE 0.84  CALCIUM 7.5*   CBG (last 3)  No results found for this basename: GLUCAP,  in the last 72 hours  Wt Readings from Last 3 Encounters:  04/06/13 174 lb 9.6 oz (79.198 kg)  04/02/13 181 lb 3.5 oz (82.2 kg)  04/02/13 181 lb 3.5 oz (82.2 kg)   Patient Vitals for the past 24 hrs:  BP Temp Temp src Pulse Resp SpO2 Weight  04/06/13 0545 95/44 mmHg 98 F (36.7 C) Oral 59 18 99 % 174 lb 9.6 oz (79.198 kg)  04/06/13 0500 - - - - - - 175 lb 4.8 oz (79.516 kg)  04/05/13 2355 90/42 mmHg 97.9 F (36.6 C) Oral 63 18 100 % -  04/05/13 2345 99/46 mmHg 98.1 F (36.7 C) Oral 63 18 100 % -  04/05/13 2245 106/57 mmHg 98.3 F (36.8 C) Oral 66 18 100 % -  04/05/13 2145 115/54 mmHg 98.3 F (36.8 C) Oral 67 18 100 % -  04/05/13 2045 95/49 mmHg 98.1 F (36.7 C) Oral 64 18 100 % -  04/05/13 2020 96/55 mmHg 97.9 F (36.6 C) Oral 64 18 100 % -  04/05/13 1930 94/46 mmHg 98.4 F (36.9 C) Oral 64 20 91 % -  04/05/13 1845 84/48 mmHg 98.4 F (36.9 C) Oral 64 20 99 % -  04/05/13 1730 104/60 mmHg 98.6 F (37 C) Oral 64 20 99 % -  04/05/13 1630 90/44 mmHg 98.1 F (36.7 C) Oral 60 20 99 % -  04/05/13 1600 98/56 mmHg 98.1 F (36.7 C) Oral 60  20 99 % -  04/05/13 1515 86/56 mmHg 98.1 F (36.7 C) Oral 110 18 - -  04/05/13 1500 109/63 mmHg - - 58 - - -  04/05/13 1055 108/47 mmHg - - 57 - - -  04/05/13 1045 88/51 mmHg - - 57 - - -    Intake/Output Summary (Last 24 hours) at 04/06/13 0916 Last data filed at 04/05/13 2345  Gross per 24 hour  Intake    880 ml  Output      0 ml  Net    880 ml   Physical Exam:  Constitutional: She appears well-developed and well-nourished. No distress  HENT: oral mucosa pink and moist, dentition fair/good  Head: Normocephalic.  Eyes: EOM are normal. Pupils are equal, round, and reactive to light.  Neck: Normal range of motion. Neck supple. No JVD present. No tracheal deviation present. No thyromegaly present.  Cardiovascular: Normal rate.  No murmur heard. No rubs, gallops  Respiratory: Effort normal. No respiratory distress. No wheezes  GI: She exhibits no distension.  Small incision sites well healed  over abdomen. BS+ Musculoskeletal:  Edema  No edema Neurological: She is alert. No cranial nerve deficit. Coordination normal.  UE's 4/5. LE's 3- HF, 3/5 KE, 4/5 distally at feet. No gross sensory deficits.  Skin: mild gluteal breakdown noted   Assessment/Plan: 1. Functional deficits secondary to deconditioning related to an incarcerated small bowel, s/p lap chole and lysis of adhsesions which require 3+ hours per day of interdisciplinary therapy in a comprehensive inpatient rehab setting. 2.  NSTEMI due to demand ischemia:  3.  ABLA: hgb improved to 9.4 -no outward signs of bleeding  -Continue iron supplement  -serial hgbs 8. A Fib: In NSR on amiodarone and coreg. Continue to monitor heart rate on bid basis.  9. Cardiomyopathy:   weights balanced. Low salt diet to avoid recurrent volume overload. BP remains low nl after improved Hct.  LOS (Days) 4 A FACE TO FACE EVALUATION WAS PERFORMED  Rogelia Boga 04/06/2013 9:12 AM

## 2013-04-06 NOTE — Progress Notes (Signed)
Occupational Therapy Session Note  Patient Details  Name: [name] MRN: [id number] Date of Birth: [birth year]  Today's Date: 04/06/2013 Time: 4098-1191 Time Calculation (min): 45 min  Skilled Therapeutic Interventions/Progress Updates: ADL in w/c at sink with focus on endurance with safety and energy conservation as needed due to c/o previous low BP.  Patient overall min A with extra time as tended to work at a slow, safe pace.  Patient wore abdominal binder for transfers and standing portion of the ADL. No c/os of dizziness or orthostatic episodes.  At end of session, patient was able to setup her tray independently.  Patient could benefit from more opportunities to complete static standing activities to increase endurance and safety in prep for episodes when she may be alone at home.    Therapy Documentation Precautions:  Precautions Precautions: Fall Precaution Comments: monitor BP, previously patient has been orthostatic Restrictions Weight Bearing Restrictions: No  Pain:denied   See FIM for current functional status  Therapy/Group: Individual Therapy  [name] [facility] 04/06/2013, 3:23 PM

## 2013-04-06 NOTE — Progress Notes (Signed)
Physical Therapy Session Note  Patient Details  Name: [name] MRN: [id number] Date of Birth: March 08, 1925  Today's Date: 04/06/2013 Time: 0930-1030 Time Calculation (min): 60 min  Short Term Goals: Week 1:  PT Short Term Goal 1 (Week 1): Pt to perform bed mobility w/ supervision PT Short Term Goal 2 (Week 1): Pt to perform t/f sit<>stand from standard height chair w/ supervision PT Short Term Goal 3 (Week 1): Pt to propel w/c 100' w/ supervision PT Short Term Goal 4 (Week 1): Pt to amb 50' w/ RW and supervision   Therapy Documentation Precautions:  Precautions: Fall Precaution Comments: monitor BP, previously patient has been orthostatic Restrictions Weight Bearing Restrictions: No Pain: denies pain  Therapeutic Exercises:(30') B LE's in supine and in sitting at EOB and in w/c. Marching in place at EOB using RW for UE support 3 x 30 seconds.  Therapeutic Activity:(30') bed mobility with min-assist to scoot to [facility]. R sideling w/ min-assist and transfers R sidelying to sit with Moderate assist.  Transfers sit<->stand into RW with min-guard assist. BP taken at rest with TED hose on and abdominal binder with BP up to 111/55.   Therapy/Group: Individual Therapy  Alysia Scism J 04/06/2013, 9:50 AM

## 2013-04-06 NOTE — Progress Notes (Signed)
Physical Therapy Session Note  Patient Details  Name: Sharmel Ballantine MRN: [id number] Date of Birth: 1924/06/03  Today's Date: 04/06/2013 Time: 1430-1500 Time Calculation (min): 30 min  Short Term Goals: Week 1:  PT Short Term Goal 1 (Week 1): Pt to perform bed mobility w/ supervision PT Short Term Goal 2 (Week 1): Pt to perform t/f sit<>stand from standard height chair w/ supervision PT Short Term Goal 3 (Week 1): Pt to propel w/c 100' w/ supervision PT Short Term Goal 4 (Week 1): Pt to amb 50' w/ RW and supervision  Therapy Documentation Precautions:  Precautions Precautions: Fall Precaution Comments: monitor BP, previously patient has been orthostatic Restrictions Weight Bearing Restrictions: No Vital Signs: Therapy Vitals Temp: 98.1 F (36.7 C) Temp src: Oral Pulse Rate: 56 Resp: 18 BP: 98/60 mmHg Patient Position, if appropriate: Sitting Oxygen Therapy SpO2: 99 % O2 Device: None (Room air) Pain: denies pain currently  TherapeuticActivity:(15') Bed mobility with Mod-A, Transfers training bed<->w/c via log roll onto right side.  Gait Training:(15') using RW for UE support, patient able to ambulate 2 x 140'      Therapy/Group: Individual Therapy  Rex Kras 04/06/2013, 6:58 PM

## 2013-04-06 NOTE — Progress Notes (Signed)
Patient blood pressure was 94/46. Orders to given lasix between blood transfusion. MD called orders given to skip the lasix due patient low blood pressure.

## 2013-04-07 ENCOUNTER — Inpatient Hospital Stay (HOSPITAL_COMMUNITY): Payer: Medicare Other | Admitting: Occupational Therapy

## 2013-04-07 DIAGNOSIS — R609 Edema, unspecified: Secondary | ICD-10-CM

## 2013-04-07 LAB — CBC WITH DIFFERENTIAL/PLATELET
Basophils Relative: 0 % (ref 0–1)
Eosinophils Relative: 3 % (ref 0–5)
HCT: 31.6 % — ABNORMAL LOW (ref 36.0–46.0)
Hemoglobin: 10.2 g/dL — ABNORMAL LOW (ref 12.0–15.0)
Lymphocytes Relative: 17 % (ref 12–46)
Lymphs Abs: 0.9 10*3/uL (ref 0.7–4.0)
MCV: 92.7 fL (ref 78.0–100.0)
Monocytes Absolute: 0.7 10*3/uL (ref 0.1–1.0)
Monocytes Relative: 14 % — ABNORMAL HIGH (ref 3–12)
Neutro Abs: 3.5 10*3/uL (ref 1.7–7.7)
RBC: 3.41 MIL/uL — ABNORMAL LOW (ref 3.87–5.11)
RDW: 18.1 % — ABNORMAL HIGH (ref 11.5–15.5)
WBC: 5.3 10*3/uL (ref 4.0–10.5)

## 2013-04-07 NOTE — Progress Notes (Signed)
Occupational Therapy Session Note  Patient Details  Name: [name] MRN: [id number] Date of Birth: Jan 02, 1925  Today's Date: 04/07/2013 Time: 7253-6644 Time Calculation (min): 60 min   Skilled Therapeutic Interventions/Progress Updates: ADL in w/c at sink with focus on initiating LB self care and dynamic standing balance.  Patient with no c/os of dizziness or orthostatic hypotension.    Therapy Documentation Precautions:  Precautions Precautions: Fall Precaution Comments: monitor BP, previously patient has been orthostatic Restrictions Weight Bearing Restrictions: No   Pain: denied Pain Assessment Pain Score: 0-No pain     See FIM for current functional status  Therapy/Group: Individual Therapy  [name] [facility] 04/07/2013, 11:37 AM

## 2013-04-07 NOTE — Progress Notes (Signed)
Patient ID: [name], female   DOB: 1924/06/08, 79 y.o.   MRN: [id number] Patient ID: [name], female   DOB: 03/07/1925, 79 y.o.   MRN: [id number]   Subjective/Complaints:  11/16.  79 y/o admit  With deconditioning following incarcerated bowel and SBO; h/o CAF, s/p NSTEMI and compensated systolic HF;  Hgh 10.2  from 7.4  2 days ago.   Alert-no complaints.  Feels well-moved to 4100 yesterday.  A 12 point review of systems has been performed and if not noted above is otherwise negative.   Objective: Vital Signs: Blood pressure 131/68, pulse 63, temperature 98.1 F (36.7 C), temperature source Oral, resp. rate 18, weight 173 lb 12.8 oz (78.835 kg), SpO2 98.00%. No results found.  Recent Labs  04/06/13 0450 04/07/13 0600  WBC 5.0 5.3  HGB 9.4* 10.2*  HCT 27.8* 31.6*  PLT 171 178    Recent Labs  04/05/13 0505  NA 139  K 4.4  CL 108  GLUCOSE 98  BUN 8  CREATININE 0.84  CALCIUM 7.5*   CBG (last 3)  No results found for this basename: GLUCAP,  in the last 72 hours  Wt Readings from Last 3 Encounters:  04/07/13 173 lb 12.8 oz (78.835 kg)  04/02/13 181 lb 3.5 oz (82.2 kg)  04/02/13 181 lb 3.5 oz (82.2 kg)   Patient Vitals for the past 24 hrs:  BP Temp Temp src Pulse Resp SpO2 Weight  04/07/13 0525 131/68 mmHg 98.1 F (36.7 C) Oral 63 18 98 % 173 lb 12.8 oz (78.835 kg)  04/06/13 1502 98/60 mmHg 98.1 F (36.7 C) Oral 56 18 99 % -    Intake/Output Summary (Last 24 hours) at 04/07/13 0758 Last data filed at 04/06/13 2143  Gross per 24 hour  Intake    420 ml  Output      0 ml  Net    420 ml   Physical Exam:  Constitutional: She appears well-developed and well-nourished. No distress  HENT: oral mucosa pink and moist, dentition fair/good  Head: Normocephalic.  Eyes: EOM are normal. Pupils are equal, round, and reactive to light.  Neck: Normal range of motion. Neck supple. No JVD present. No tracheal deviation present. No thyromegaly present.   Cardiovascular: Normal rate.  No murmur heard. No rubs, gallops  Respiratory: Effort normal. No respiratory distress. No wheezes  GI: She exhibits no distension.  Small incision sites well healed over abdomen. BS+ Musculoskeletal:  Edema  +2 edema; hand edema has resolved Neurological: She is alert. No cranial nerve deficit. Coordination normal.   Skin: mild gluteal breakdown noted   Assessment/Plan: 1. Functional deficits secondary to deconditioning related to an incarcerated small bowel, s/p lap chole and lysis of adhsesions which require 3+ hours per day of interdisciplinary therapy in a comprehensive inpatient rehab setting. 2.  NSTEMI due to demand ischemia:  3.  ABLA: hgb improved to 10.2 -no outward signs of bleeding  -Continue iron supplement  -serial hgbs 8. A Fib: In NSR on amiodarone and coreg. Continue to monitor heart rate on bid basis.  9. Cardiomyopathy:   weights balanced. Low salt diet to avoid recurrent volume overload. BP remains low nl after improved Hct.  LOS (Days) 5 A FACE TO FACE EVALUATION WAS PERFORMED  Rogelia Boga 04/07/2013 7:58 AM

## 2013-04-07 NOTE — Progress Notes (Signed)
Refused scheduled imodium at HS. Up several times during the night with loose stool. At 2340 PRN xanax 1mg  given per paatient's request. [name]

## 2013-04-08 ENCOUNTER — Inpatient Hospital Stay (HOSPITAL_COMMUNITY): Payer: Medicare Other

## 2013-04-08 ENCOUNTER — Inpatient Hospital Stay (HOSPITAL_COMMUNITY): Payer: Medicare Other | Admitting: Physical Therapy

## 2013-04-08 DIAGNOSIS — R5381 Other malaise: Secondary | ICD-10-CM

## 2013-04-08 DIAGNOSIS — K56609 Unspecified intestinal obstruction, unspecified as to partial versus complete obstruction: Secondary | ICD-10-CM

## 2013-04-08 DIAGNOSIS — D62 Acute posthemorrhagic anemia: Secondary | ICD-10-CM

## 2013-04-08 LAB — COMPREHENSIVE METABOLIC PANEL
ALT: 8 U/L (ref 0–35)
Albumin: 2.1 g/dL — ABNORMAL LOW (ref 3.5–5.2)
BUN: 10 mg/dL (ref 6–23)
Chloride: 102 mEq/L (ref 96–112)
Creatinine, Ser: 0.85 mg/dL (ref 0.50–1.10)
GFR calc Af Amer: 70 mL/min — ABNORMAL LOW (ref >90)
Glucose, Bld: 86 mg/dL (ref 70–99)
Potassium: 4.4 mEq/L (ref 3.5–5.1)
Total Bilirubin: 0.4 mg/dL (ref 0.3–1.2)

## 2013-04-08 LAB — CBC WITH DIFFERENTIAL/PLATELET
Basophils Relative: 0 % (ref 0–1)
Eosinophils Absolute: 0.2 10*3/uL (ref 0.0–0.7)
HCT: 29.3 % — ABNORMAL LOW (ref 36.0–46.0)
Hemoglobin: 9.7 g/dL — ABNORMAL LOW (ref 12.0–15.0)
Lymphocytes Relative: 16 % (ref 12–46)
Lymphs Abs: 0.7 10*3/uL (ref 0.7–4.0)
MCH: 30.5 pg (ref 26.0–34.0)
MCHC: 33.1 g/dL (ref 30.0–36.0)
Monocytes Absolute: 0.5 10*3/uL (ref 0.1–1.0)
Monocytes Relative: 12 % (ref 3–12)
Neutro Abs: 2.8 10*3/uL (ref 1.7–7.7)
Neutrophils Relative %: 68 % (ref 43–77)
Platelets: 171 10*3/uL (ref 150–400)
RBC: 3.18 MIL/uL — ABNORMAL LOW (ref 3.87–5.11)

## 2013-04-08 NOTE — Progress Notes (Signed)
Physical Therapy Session Note  Patient Details  Name: [name] MRN: [id number] Date of Birth: 1925-01-31  Today's Date: 04/08/2013 First session Time: 0730-0828 Time Calculation (min): 58 min First Session Skilled Therapeutic Interventions/Progress Updates:    Sitting in chair without TEDs or Abdominal binder, finishing breakfast. TEDs and Abdominal binder donned, BP 112/70. With standing 119/70 and pt reported feeling dizzy but recovered with seated rest. Ambulation x 120' (x 2), 87' (x2) with RW and min-guard assist able to self-monitor fatigue. NuStep for endurance and activity tolerance level 3 x 15 min with one rest break, encouraged to keep steps/min >40. Bathroom mobility with RW, supervision, needs assist with pants and underwear.  Second Session Skilled Therapeutic Interventions/Progress Updates:  Time:  4098-1191 Time Calculation (min):  41 min Pt with more c/o dizziness this session. BP sitting without binder 94/61; with binder 98/59; with binder after 5 min 94/58. RN made aware, pt monitored throughout treatment. Ambulation x 130' with RW and min-guard assist. Car transfer with supevision for safety, no verbal cues needed. Stairs x 4 with bil. Railing min-guard assist. Obstacle course x 2 stepping over obstacles with min assist, navigating obstacles min-guard assist. Min cues for safety with RW and wheelchair. Pt making good progress.  Therapy Documentation Precautions:  Precautions Precautions: Fall Precaution Comments: monitor BP, previously patient has been orthostatic Restrictions Weight Bearing Restrictions: No Pain: Pain Assessment Pain Assessment: No/denies pain either session  See FIM for current functional status  Therapy/Group: Individual Therapy both session  Wilhemina Bonito 04/08/2013, 8:30 AM

## 2013-04-08 NOTE — Progress Notes (Signed)
Occupational Therapy Session Note  Patient Details  Name: [name] MRN: [id number] Date of Birth: [birth year]  Today's Date: 04/08/2013  Session 1 Time: 1000-1100 Time Calculation (min): 60 min  Short Term Goals: Week 1:  OT Short Term Goal 1 (Week 1): Patient will complete UB/LB bathing with minimal assistance OT Short Term Goal 2 (Week 1): Patient will complete LB dressing with minimal assistance OT Short Term Goal 3 (Week 1): Patient will complete toileting tasks (peri care & clothing management) with moderate assistance OT Short Term Goal 4 (Week 1): Patient will complete shower stall transfers with minimal assistance  Skilled Therapeutic Interventions/Progress Updates:    Pt engaged in bathing and dressing at w/c level.  Focus on activity tolerance, functional ambulation, dynamic standing balance, and safety awareness.  Pt required assistance with LB bathing without AE.  Pt requires extra time with multiple rest breaks throughout session.  Pt did not complain of dizziness or nausea during session.  Pt removed abdominal binder during bathing without symptoms.  Therapy Documentation Precautions:  Precautions Precautions: Fall Precaution Comments: monitor BP, previously patient has been orthostatic Restrictions Weight Bearing Restrictions: No Pain: Pain Assessment Pain Assessment: No/denies pain  See FIM for current functional status  Therapy/Group: Individual Therapy  Session 2 Pt denies pain Individual Therapy  Pt resting in bed upon arrival but agreeable to participating in therapy.  Focus on bed mobility, shower transfers, functional amb with RW, and activity tolerance.  Pt able to perform supine<>sit in regular bed and hospital bed without assistance.  Pt required min A when performing sir<>stand from lower surface (sofa in ADL apartment).  Lavone Neri [facility] 04/08/2013, 11:11 AM

## 2013-04-08 NOTE — Progress Notes (Signed)
Subjective/Complaints: Had a good Sunday. Feeling stronger. No complaints. Walking back from the bathroom with tech. A 12 point review of systems has been performed and if not noted above is otherwise negative.   Objective: Vital Signs: Blood pressure 94/58, pulse 56, temperature 97.5 F (36.4 C), temperature source Oral, resp. rate 16, weight 78.835 kg (173 lb 12.8 oz), SpO2 100.00%. No results found.  Recent Labs  04/07/13 0600 04/08/13 0513  WBC 5.3 4.2  HGB 10.2* 9.7*  HCT 31.6* 29.3*  PLT 178 171    Recent Labs  04/08/13 0513  NA 135  K 4.4  CL 102  GLUCOSE 86  BUN 10  CREATININE 0.85  CALCIUM 8.3*   CBG (last 3)  No results found for this basename: GLUCAP,  in the last 72 hours  Wt Readings from Last 3 Encounters:  04/07/13 78.835 kg (173 lb 12.8 oz)  04/02/13 82.2 kg (181 lb 3.5 oz)  04/02/13 82.2 kg (181 lb 3.5 oz)    Physical Exam:  Constitutional: She appears well-developed and well-nourished. No distress  HENT: oral mucosa pink and moist, dentition fair/good  Head: Normocephalic.  Eyes: EOM are normal. Pupils are equal, round, and reactive to light.  Neck: Normal range of motion. Neck supple. No JVD present. No tracheal deviation present. No thyromegaly present.  Cardiovascular: Normal rate.  No murmur heard. No rubs, gallops  Respiratory: Effort normal. No respiratory distress. No wheezes  GI: She exhibits no distension. There is tenderness. Small incision sites well healed over abdomen. BS+ Musculoskeletal:  Edema tr in LE's, tr  in UE's.  Homan's -, left knee a little tender with ROM.  Neurological: She is alert. No cranial nerve deficit. Coordination normal.  UE's 4/5. LE's 3- HF, 3/5 KE, 4/5 distally at feet. No gross sensory deficits.  Psychiatric: She has a normal mood and affect. Her behavior is normal. Judgment and thought content normal.  Skin: mild gluteal breakdown noted   Assessment/Plan: 1. Functional deficits secondary to  deconditioning related to an incarcerated small bowel, s/p lap chole and lysis of adhsesions which require 3+ hours per day of interdisciplinary therapy in a comprehensive inpatient rehab setting. Physiatrist is providing close team supervision and 24 hour management of active medical problems listed below. Physiatrist and rehab team continue to assess barriers to discharge/monitor patient progress toward functional and medical goals. FIM: FIM - Bathing Bathing Steps Patient Completed: Chest;Right Arm;Left Arm;Abdomen;Right upper leg;Left upper leg;Left lower leg (including foot) Bathing: 3: Mod-Patient completes 5-7 36f 10 parts or 50-74%  FIM - Upper Body Dressing/Undressing Upper body dressing/undressing steps patient completed: Thread/unthread right sleeve of pullover shirt/dresss;Thread/unthread left sleeve of pullover shirt/dress;Put head through opening of pull over shirt/dress;Pull shirt over trunk Upper body dressing/undressing: 5: Supervision: Safety issues/verbal cues FIM - Lower Body Dressing/Undressing Lower body dressing/undressing steps patient completed: Thread/unthread left pants leg;Thread/unthread right pants leg;Pull underwear up/down;Thread/unthread left underwear leg;Thread/unthread right underwear leg Lower body dressing/undressing: 3: Mod-Patient completed 50-74% of tasks  FIM - Toileting Toileting steps completed by patient: Adjust clothing prior to toileting;Performs perineal hygiene;Adjust clothing after toileting Toileting Assistive Devices: Grab bar or rail for support Toileting: 4: Steadying assist  FIM - Diplomatic Services operational officer Devices: Grab bars;Elevated toilet seat Toilet Transfers: 5-To toilet/BSC: Supervision (verbal cues/safety issues)  FIM - Banker Devices: Bed rails;Arm rests;Walker Bed/Chair Transfer: 4: Bed > Chair or W/C: Min A (steadying Pt. > 75%)  FIM - Locomotion:  Wheelchair Locomotion: Wheelchair: 2: Travels 50 -  149 ft with supervision, cueing or coaxing FIM - Locomotion: Ambulation Locomotion: Ambulation Assistive Devices: Walker - Rolling Ambulation/Gait Assistance: 4: Min guard Locomotion: Ambulation: 2: Travels 50 - 149 ft with minimal assistance (Pt.>75%)  Comprehension Comprehension Mode: Auditory Comprehension: 6-Follows complex conversation/direction: With extra time/assistive device  Expression Expression Mode: Verbal Expression: 6-Expresses complex ideas: With extra time/assistive device  Social Interaction Social Interaction: 6-Interacts appropriately with others with medication or extra time (anti-anxiety, antidepressant).  Problem Solving Problem Solving: 6-Solves complex problems: With extra time  Memory Memory: 6-More than reasonable amt of time  Medical Problem List and Plan:  Deconditioning due to incarcerated hernia with SBO, cholelithiasis with acute and chronically inflamed GB--requiring L, 1. DVT Prophylaxis/Anticoagulation: Pharmaceutical: Lovenox  2. Pain Management: tylenol for now. No substantial pain at present. Local remedies if possible also  3. Mood: Continue bupropion daily. Motivated to get better--LCSW to follow for evaluation.  4. Neuropsych: This patient is capable of making decisions on her own behalf.  5. 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 Monitor lytes with routine check. Continue probiotics and mycophenolate.  -scheduled imodium added, stools more formed yesterday.  -low fiber diet  6. NSTEMI due to demand ischemia:  7. ABLA: transfused Friday due to symptomatic anemia  -Continue iron supplement  -hgb 9.7  -continue to follow 8. A Fib: In NSR on amiodarone and coreg. Continue to monitor heart rate on bid basis.  9. Cardiomyopathy:   weights balanced. Low salt diet to avoid recurrent volume overload. On coreg, Lipitor and lisinopril.  -I's O's + but  weights are down  -continue current regimen    LOS (Days) 6 A FACE TO FACE EVALUATION WAS PERFORMED  Brandii Lakey T 04/08/2013 7:41 AM

## 2013-04-09 ENCOUNTER — Inpatient Hospital Stay (HOSPITAL_COMMUNITY): Payer: Medicare Other | Admitting: Physical Therapy

## 2013-04-09 ENCOUNTER — Inpatient Hospital Stay (HOSPITAL_COMMUNITY): Payer: Medicare Other

## 2013-04-09 DIAGNOSIS — K56609 Unspecified intestinal obstruction, unspecified as to partial versus complete obstruction: Secondary | ICD-10-CM

## 2013-04-09 DIAGNOSIS — D62 Acute posthemorrhagic anemia: Secondary | ICD-10-CM

## 2013-04-09 DIAGNOSIS — R5381 Other malaise: Secondary | ICD-10-CM

## 2013-04-09 LAB — CBC WITH DIFFERENTIAL/PLATELET
Basophils Absolute: 0 10*3/uL (ref 0.0–0.1)
Eosinophils Relative: 4 % (ref 0–5)
Hemoglobin: 9.8 g/dL — ABNORMAL LOW (ref 12.0–15.0)
Lymphocytes Relative: 17 % (ref 12–46)
Lymphs Abs: 0.8 10*3/uL (ref 0.7–4.0)
MCV: 93.2 fL (ref 78.0–100.0)
Monocytes Relative: 11 % (ref 3–12)
Neutro Abs: 3.2 10*3/uL (ref 1.7–7.7)
Neutrophils Relative %: 68 % (ref 43–77)
Platelets: 185 10*3/uL (ref 150–400)
RBC: 3.23 MIL/uL — ABNORMAL LOW (ref 3.87–5.11)
RDW: 17.1 % — ABNORMAL HIGH (ref 11.5–15.5)
WBC: 4.7 10*3/uL (ref 4.0–10.5)

## 2013-04-09 MED ORDER — FUROSEMIDE 20 MG PO TABS
20.0000 mg | ORAL_TABLET | Freq: Every day | ORAL | Status: DC
  Filled 2013-04-09 (×3): qty 1

## 2013-04-09 MED ORDER — POTASSIUM CHLORIDE CRYS ER 10 MEQ PO TBCR
10.0000 meq | EXTENDED_RELEASE_TABLET | Freq: Every day | ORAL | Status: DC
  Administered 2013-04-10 – 2013-04-11 (×2): 10 meq via ORAL
  Filled 2013-04-09 (×3): qty 1

## 2013-04-09 NOTE — Progress Notes (Signed)
Physical Therapy Session Note  Patient Details  Name: [name] MRN: [id number] Date of Birth: 05-01-1925  Today's Date: 04/09/2013 Time: 3086-5784 Time Calculation (min): 27 min  Short Term Goals: Week 1:  PT Short Term Goal 1 (Week 1): Pt to perform bed mobility w/ supervision PT Short Term Goal 2 (Week 1): Pt to perform t/f sit<>stand from standard height chair w/ supervision PT Short Term Goal 3 (Week 1): Pt to propel w/c 100' w/ supervision PT Short Term Goal 4 (Week 1): Pt to amb 66' w/ RW and supervision  Skilled Therapeutic Interventions/Progress Updates:    Pt reports fatigue from back to back therapy sessions this AM but willing to participate. Focused on overall strengthening and endurance including Nustep on level 4 (BUE and BLE) x 10 min and basic transfers/standing tolerance. Pt without abdominal binder this AM and reports dizziness is improved but still present.  BP seated: 110/69 mmHg BP standing immediately: 93/63 mHg (symptoms present) BP after standing/marching in place x : 102/64 mmHg (symptoms resolving)  Pt declined walking back to too room due to fatigue. S transfer back to bed using RW but required A with LE's onto the bed to get into supine. All needs in place.  Therapy Documentation Precautions:  Precautions Precautions: Fall Precaution Comments: monitor BP, previously patient has been orthostatic Restrictions Weight Bearing Restrictions: No  Pain: Pain Assessment Pain Assessment: No/denies pain    See FIM for current functional status  Therapy/Group: Individual Therapy  Karolee Stamps [facility] 04/09/2013, 10:43 AM

## 2013-04-09 NOTE — Progress Notes (Signed)
Social Work Patient ID: [name], female   DOB: Oct 23, 1924, 79 y.o.   MRN: [id number]  Have reviewed team conference with pt and daughter [name]) - both pleased and agreeable with targeted d/c 11/20 with supervision to mod i goals. Daughter does not feel they need to go through further family education given pt's gains and her recent stay on CIR.  Plan to restart [facility] services via [facility] per pt request.    Rozella Servello, LCSW

## 2013-04-09 NOTE — Progress Notes (Signed)
Social Work Patient ID: [name], female   DOB: May 28, 1924, 79 y.o.   MRN: [id number] Amada Jupiter, LCSW Social Worker Signed  Patient Care Conference Service date: 04/09/2013 4:36 PM  Inpatient RehabilitationTeam Conference and Plan of Care Update Date: 04/09/2013   Time: 2:05 PM     Patient Name: [name]       Medical Record Number: [id number]   Date of Birth: 02-14-1925 Sex: Female         Room/Bed: 4M02C/4M02C-01 Payor Info: Payor: MEDICARE / Plan: MEDICARE PART A AND B / Product Type: *No Product type* /   Admitting Diagnosis: Deconditioned after MI   Admit Date/Time:  04/02/2013  3:05 PM Admission Comments: No comment available   Primary Diagnosis:  Physical deconditioning Principal Problem: Physical deconditioning    Patient Active Problem List     Diagnosis  Date Noted   .  Physical deconditioning  04/02/2013   .  Pedal edema  04/01/2013   .  Systolic CHF  03/27/2013   .  Pulmonary hypertension  03/27/2013   .  Hypokalemia  03/27/2013   .  SBO (small bowel obstruction)  03/25/2013   .  Ulcerative colitis     .  Protein-calorie malnutrition, severe  03/24/2013   .  Anemia of chronic disease  03/23/2013   .  Acute respiratory failure with hypoxia  03/23/2013   .  Atrial fibrillation with RVR  03/22/2013   .  N&V (nausea and vomiting)  03/22/2013   .  Porcelain gallbladder  03/22/2013   .  Incisional hernia, incarcerated s/p lap repair 03/25/2013  03/22/2013   .  NSTEMI (non-ST elevated myocardial infarction)  03/22/2013   .  Acute renal failure  12/07/2012   .  ANXIETY  12/26/2007   .  HYPOTHYROIDISM  11/10/2006     Expected Discharge Date: Expected Discharge Date: 04/11/13  Team Members Present: Physician leading conference: Dr. Faith Rogue Social Worker Present: Amada Jupiter, LCSW Nurse Present: Other (comment) Tennis Must, RN) PT Present: Sherrine Maples, Grayland Ormond, PT OT Present: Mackie Pai, OT;Patricia Mat Carne, OT PPS Coordinator present  : Tora Duck, RN, CRRN;Becky Henrene Dodge, PT        Current Status/Progress  Goal  Weekly Team Focus   Medical     improved bp and activity tolerance, stools more consistent, anemic and received blood  stabilize medically for dc  electrolyte and bowel mgt,  post-op mgt   Bowel/Bladder     continent of bowel and bladder. 1 BM this shift, medium and loose. on scheduled imodium.  remain continent of bowel and bladder  monitor for diarrhea and constipation q shift   Swallow/Nutrition/ Hydration            ADL's     min A for LB bathing and dressing; decreased activity tolerance  supervision overall; mod I toilet transfers and toileting  activity tolerance; dynamic standing balance   Mobility     min assist/supervision   supervision  dynamic balance, safety, activity tolerance   Communication            Safety/Cognition/ Behavioral Observations           Pain     denies pain  pain less than 2  monitor for pain   Skin     stage 2 to buttocks. Allevyn in place. Bruising to right cheek area.  no new skin breakdown while in rehab  monitor skin q shift prn    Rehab Goals Patient on  target to meet rehab goals: Yes *See Care Plan and progress notes for long and short-term goals.    Barriers to Discharge:  weakness, bowel and bladder      Possible Resolutions to Barriers:    bowel mgt, strength and stamina training      Discharge Planning/Teaching Needs:    home with daughter and son-in-law who can provide 24/7 assistance      Team Discussion:    Pt making excellent gains and anticipated easily reaching supervision to mod i goals.  No concerns.   Revisions to Treatment Plan:    None    Continued Need for Acute Rehabilitation Level of Care: The patient requires daily medical management by a physician with specialized training in physical medicine and rehabilitation for the following conditions: Daily direction of a multidisciplinary physical rehabilitation program to ensure safe  treatment while eliciting the highest outcome that is of practical value to the patient.: Yes Daily medical management of patient stability for increased activity during participation in an intensive rehabilitation regime.: Yes Daily analysis of laboratory values and/or radiology reports with any subsequent need for medication adjustment of medical intervention for : Post surgical problems;Other  Marvelene Stoneberg 04/09/2013, 4:36 PM         Amada Jupiter, LCSW Social Worker Signed  Patient Care Conference Service date: 02/06/2013 3:01 PM  Inpatient RehabilitationTeam Conference and Plan of Care Update Date: 02/05/2013   Time: 3:05 PM     Patient Name: [name]       Medical Record Number: [id number]   Date of Birth: 1924-11-10 Sex: Female         Room/Bed: 4W23C/4W23C-01 Payor Info: Payor: MEDICARE / Plan: MEDICARE PART A AND B / Product Type: *No Product type* /   Admitting Diagnosis: CMV   Admit Date/Time:  02/04/2013  3:35 PM Admission Comments: No comment available   Primary Diagnosis:  Physical deconditioning Principal Problem: Physical deconditioning    Patient Active Problem List     Diagnosis  Date Noted   .  Physical deconditioning  02/04/2013   .  Atrial fibrillation  01/30/2013   .  Unintentional weight loss  01/29/2013   .  Lower GI bleed  01/24/2013   .  DVT of leg (deep venous thrombosis)  01/23/2013   .  Nausea alone  01/14/2013   .  Loss of appetite  01/14/2013   .  Malnutrition of moderate degree  12/10/2012   .  CMV colitis  12/09/2012   .  Hypokalemia  12/07/2012   .  Acute renal failure  12/07/2012   .  VITAMIN B12 DEFICIENCY  02/22/2010   .  ANXIETY  12/26/2007   .  Myasthenia gravis without exacerbation  12/26/2007   .  TIA  12/26/2007   .  HYPOTHYROIDISM  11/10/2006   .  HYPERLIPIDEMIA  11/10/2006   .  ANEMIA-NOS  11/10/2006   .  HYPERTENSION  11/10/2006     Expected Discharge Date: Expected Discharge Date: 02/11/13  Team Members  Present: Physician leading conference: Dr. Faith Rogue Social Worker Present: Amada Jupiter, LCSW Nurse Present: Ethelene Browns, RN PT Present: Zerita Boers, PT OT Present: Mackie Pai, Marye Round, OT;Ardis Rowan, COTA SLP Present: Feliberto Gottron, SLP PPS Coordinator present : Edson Snowball, PT        Current Status/Progress  Goal  Weekly Team Focus   Medical     CMV enteritis, deconditionng, hx of MG  pain control, skin care, increase physical activity  bowel mgt, id mgt,   Bowel/Bladder     Continent of bowel and bladder. LBM 9/16 3X formed and loose stool. Imodium given.  Remain incontinent of bowel and bladder  Monitor diarrhea/constipation q shift.   Swallow/Nutrition/ Hydration            ADL's     supervision- min A with BADL's  mod I with ADL's  shower and toilet transfers, activity tolerance, adaptive eqiupment for bathing/dressing as needed   Mobility     Min assist overall for transfers, ambulation and stairs  Mod I overall, supervision for stairs and car transfer  balance, gait training, safety, education, DME needs   Communication            Safety/Cognition/ Behavioral Observations           Pain     n/a  Pain level 3 or less on a scale of 0-10  Monitor any onset of pain and assess for effectiveness   Skin     stage 2 buttocks mid with Allevyn dressing/ bilateral open blisters  No new skin breakdown/infection  Assess and monitor skin q shift    Rehab Goals Patient on target to meet rehab goals: Yes *See Care Plan and progress notes for long and short-term goals.    Barriers to Discharge:  weakness, stool consistency      Possible Resolutions to Barriers:    bowel program, normalize intake stools, adaptive equipment      Discharge Planning/Teaching Needs:    home with daughter and son-in-law who can provide 24/ 7 assistance      Team Discussion:    Still with some bowel issues but resolving.  Making good progress and anticipating mod i  goals overall.  No concerns   Revisions to Treatment Plan:    None    Continued Need for Acute Rehabilitation Level of Care: The patient requires daily medical management by a physician with specialized training in physical medicine and rehabilitation for the following conditions: Daily direction of a multidisciplinary physical rehabilitation program to ensure safe treatment while eliciting the highest outcome that is of practical value to the patient.: Yes Daily medical management of patient stability for increased activity during participation in an intensive rehabilitation regime.: Yes Daily analysis of laboratory values and/or radiology reports with any subsequent need for medication adjustment of medical intervention for : Neurological problems;Post surgical problems;Other  Havish Petties 02/06/2013, 3:01 PM

## 2013-04-09 NOTE — Patient Care Conference (Signed)
Inpatient RehabilitationTeam Conference and Plan of Care Update Date: 04/09/2013   Time: 2:05 PM    Patient Name: [name]      Medical Record Number: [id number]  Date of Birth: 09/20/1924 Sex: Female         Room/Bed: 4M02C/4M02C-01 Payor Info: Payor: MEDICARE / Plan: MEDICARE PART A AND B / Product Type: *No Product type* /    Admitting Diagnosis: Deconditioned after MI  Admit Date/Time:  04/02/2013  3:05 PM Admission Comments: No comment available   Primary Diagnosis:  Physical deconditioning Principal Problem: Physical deconditioning  Patient Active Problem List   Diagnosis Date Noted  . Physical deconditioning 04/02/2013  . Pedal edema 04/01/2013  . Systolic CHF 03/27/2013  . Pulmonary hypertension 03/27/2013  . Hypokalemia 03/27/2013  . SBO (small bowel obstruction) 03/25/2013  . Ulcerative colitis   . Protein-calorie malnutrition, severe 03/24/2013  . Anemia of chronic disease 03/23/2013  . Acute respiratory failure with hypoxia 03/23/2013  . Atrial fibrillation with RVR 03/22/2013  . N&V (nausea and vomiting) 03/22/2013  . Porcelain gallbladder 03/22/2013  . Incisional hernia, incarcerated s/p lap repair 03/25/2013 03/22/2013  . NSTEMI (non-ST elevated myocardial infarction) 03/22/2013  . Acute renal failure 12/07/2012  . ANXIETY 12/26/2007  . HYPOTHYROIDISM 11/10/2006    Expected Discharge Date: Expected Discharge Date: 04/11/13  Team Members Present: Physician leading conference: Dr. Faith Rogue Social Worker Present: Amada Jupiter, LCSW Nurse Present: Other (comment) Tennis Must, RN) PT Present: Sherrine Maples, Grayland Ormond, PT OT Present: Mackie Pai, OT;Patricia Mat Carne, OT PPS Coordinator present : Tora Duck, RN, CRRN;Becky Henrene Dodge, PT     Current Status/Progress Goal Weekly Team Focus  Medical   improved bp and activity tolerance, stools more consistent, anemic and received blood  stabilize medically for dc  electrolyte and bowel mgt,   post-op mgt   Bowel/Bladder   continent of bowel and bladder. 1 BM this shift, medium and loose. on scheduled imodium.  remain continent of bowel and bladder  monitor for diarrhea and constipation q shift   Swallow/Nutrition/ Hydration             ADL's   min A for LB bathing and dressing; decreased activity tolerance  supervision overall; mod I toilet transfers and toileting  activity tolerance; dynamic standing balance   Mobility   min assist/supervision   supervision  dynamic balance, safety, activity tolerance   Communication             Safety/Cognition/ Behavioral Observations            Pain   denies pain  pain less than 2  monitor for pain   Skin   stage 2 to buttocks. Allevyn in place. Bruising to right cheek area.  no new skin breakdown while in rehab  monitor skin q shift prn    Rehab Goals Patient on target to meet rehab goals: Yes *See Care Plan and progress notes for long and short-term goals.  Barriers to Discharge: weakness, bowel and bladder    Possible Resolutions to Barriers:  bowel mgt, strength and stamina training    Discharge Planning/Teaching Needs:  home with daughter and son-in-law who can provide 24/7 assistance      Team Discussion:  Pt making excellent gains and anticipated easily reaching supervision to mod i goals.  No concerns.  Revisions to Treatment Plan:  None   Continued Need for Acute Rehabilitation Level of Care: The patient requires daily medical management by a physician with specialized training in physical  medicine and rehabilitation for the following conditions: Daily direction of a multidisciplinary physical rehabilitation program to ensure safe treatment while eliciting the highest outcome that is of practical value to the patient.: Yes Daily medical management of patient stability for increased activity during participation in an intensive rehabilitation regime.: Yes Daily analysis of laboratory values and/or radiology  reports with any subsequent need for medication adjustment of medical intervention for : Post surgical problems;Other  Makelle Marrone 04/09/2013, 4:36 PM

## 2013-04-09 NOTE — Progress Notes (Addendum)
Physical Therapy Session Note  Patient Details  Name: [name] MRN: [id number] Date of Birth: [birth year]  Today's Date: 04/09/2013 Time: 4098-1191 Time Calculation (min): 45 min  Short Term Goals: Week 1:  PT Short Term Goal 1 (Week 1): Pt to perform bed mobility w/ supervision PT Short Term Goal 2 (Week 1): Pt to perform t/f sit<>stand from standard height chair w/ supervision PT Short Term Goal 3 (Week 1): Pt to propel w/c 100' w/ supervision PT Short Term Goal 4 (Week 1): Pt to amb 29' w/ RW and supervision  Skilled Therapeutic Interventions/Progress Updates:    Pt fatigued but willing to work. Ambulation x 130', 50', 150' with RW and supervision in controlled environment. Home mobility with RW: low couch transfers (min assist), narrow space/obstacle negotiation over carpet with RW (supervision), and making bed with (min-guard assist). Neuromuscular re-ed working limits of stability and controlled balance reactions without UE support on Biodex x 4 bouts. Pt still typically scores <0 on Biodex indicating significant balance difficulties however pt functionally making good gains. No dizziness complaints.  Therapy Documentation Precautions:  Precautions Precautions: Fall Precaution Comments: monitor BP, previously patient has been orthostatic Restrictions Weight Bearing Restrictions: No Pain:  no c/o  See FIM for current functional status  Therapy/Group: Individual Therapy  Wilhemina Bonito 04/09/2013, 1:54 PM

## 2013-04-09 NOTE — Progress Notes (Signed)
Physical Therapy Weekly Progress Note  Patient Details  Name: [name] MRN: [id number] Date of Birth: March 18, 1925  Today's Date: 04/09/2013 Time: 0730-0827 Time Calculation (min): 57 min  Patient has met 4 of 4 short term goals.  Pt is making slow steady gains and is nearing supervision with ambulation and mobility. She continues to be limited by dizziness/lightheadedness with upright activity and generalized fatigue.   Patient continues to demonstrate the following deficits: decreased endurance, impaired balance, decreased safety with mobility and therefore will continue to benefit from skilled PT intervention to enhance overall performance with activity tolerance, balance and ability to compensate for deficits.  Patient progressing toward long term goals..  Continue plan of care.  PT Short Term Goals Week 1:  PT Short Term Goal 1 (Week 1): Pt to perform bed mobility w/ supervision - met PT Short Term Goal 2 (Week 1): Pt to perform t/f sit<>stand from standard height chair w/ supervision - met PT Short Term Goal 3 (Week 1): Pt to propel w/c 100' w/ supervision - met PT Short Term Goal 4 (Week 1): Pt to amb 23' w/ RW and supervision - met  Skilled Therapeutic Interventions/Progress Updates:    Bed mobility and bathroom mobility with RW, min-guard/supervision for safety. Dizziness much improved, still wearing TEDs but does not need abdominal binder today. Ambulation 2 x >150' with RW controlled environment with supervision, no seated rest break needed today. Standing balance and balance reactions while on compliant surface: varied bases of support including modified single limb stance with emphasis on decreasing UE reliance. Strengthening exercise: mini squats with yellow weighted ball, long arc quads/hamstring curls/hip abduction/adduction resisted with orange theraband 2 x 10 reps each. Seated in recliner at end of session, all needs within reach.  Therapy Documentation Precautions:   Precautions Precautions: Fall Precaution Comments: monitor BP, previously patient has been orthostatic Restrictions Weight Bearing Restrictions: No Pain: Pain Assessment Pain Assessment: No/denies pain  See FIM for current functional status  Therapy/Group: Individual Therapy  Wilhemina Bonito 04/09/2013, 8:29 AM

## 2013-04-09 NOTE — Progress Notes (Signed)
Occupational Therapy Session Note  Patient Details  Name: [name] MRN: [id number] Date of Birth: 1924-06-21  Today's Date: 04/09/2013 Time: 0830-0930 Time Calculation (min): 60 min  Short Term Goals: Week 1:  OT Short Term Goal 1 (Week 1): Patient will complete UB/LB bathing with minimal assistance OT Short Term Goal 2 (Week 1): Patient will complete LB dressing with minimal assistance OT Short Term Goal 3 (Week 1): Patient will complete toileting tasks (peri care & clothing management) with moderate assistance OT Short Term Goal 4 (Week 1): Patient will complete shower stall transfers with minimal assistance  Skilled Therapeutic Interventions/Progress Updates: ADL-retraining with emphasis on functional transfers, functional mobility using RW during ADL, endurance, and use of AE for lower body bathing.   Patient able to ambulate with RW from recliner to closet and to dresser to retrieve clothing items, recover to chair for rest break of 3 minutes, and resume mobility to bathroom to complete seated bathing and dressing with steadying assist X1 transfer and supervision for remaining ADL with the exception of donning socks (total assist for compression stocking and non-skid socks).   Patient complete lower body bathing using LH sponge and demonstrated effective self-monitoring for fatigue, using seated rest breaks as needed.    Therapy Documentation Precautions:  Precautions Precautions: Fall Precaution Comments: monitor BP, previously patient has been orthostatic Restrictions Weight Bearing Restrictions: No  Pain: No pain   See FIM for current functional status  Therapy/Group: Individual Therapy  Coltrane Tugwell 04/09/2013, 3:41 PM

## 2013-04-09 NOTE — Progress Notes (Signed)
Subjective/Complaints: For the most part feeling well and stronger. A 12 point review of systems has been performed and if not noted above is otherwise negative.   Objective: Vital Signs: Blood pressure 124/71, pulse 69, temperature 98.1 F (36.7 C), temperature source Oral, resp. rate 18, weight 78.835 kg (173 lb 12.8 oz), SpO2 97.00%. No results found.  Recent Labs  04/08/13 0513 04/09/13 0515  WBC 4.2 4.7  HGB 9.7* 9.8*  HCT 29.3* 30.1*  PLT 171 185    Recent Labs  04/08/13 0513  NA 135  K 4.4  CL 102  GLUCOSE 86  BUN 10  CREATININE 0.85  CALCIUM 8.3*   CBG (last 3)  No results found for this basename: GLUCAP,  in the last 72 hours  Wt Readings from Last 3 Encounters:  04/09/13 78.835 kg (173 lb 12.8 oz)  04/02/13 82.2 kg (181 lb 3.5 oz)  04/02/13 82.2 kg (181 lb 3.5 oz)    Physical Exam:  Constitutional: She appears well-developed and well-nourished. No distress  HENT: oral mucosa pink and moist, dentition fair/good  Head: Normocephalic.  Eyes: EOM are normal. Pupils are equal, round, and reactive to light.  Neck: Normal range of motion. Neck supple. No JVD present. No tracheal deviation present. No thyromegaly present.  Cardiovascular: Normal rate.  No murmur heard. No rubs, gallops  Respiratory: Effort normal. No respiratory distress. No wheezes  GI: She exhibits no distension. There is tenderness. Small incision sites well healed over abdomen. BS+ Musculoskeletal:  Edema tr in LE's, tr  in UE's.  Homan's -, left knee a little tender with ROM.  Neurological: She is alert. No cranial nerve deficit. Coordination normal.  UE's 4/5. LE's 3 HF, 3+/5 KE, 4/5 distally at feet. No gross sensory deficits.  Psychiatric: She has a normal mood and affect. Her behavior is normal. Judgment and thought content normal.  Skin: mild gluteal breakdown noted   Assessment/Plan: 1. Functional deficits secondary to deconditioning related to an incarcerated small bowel,  s/p lap chole and lysis of adhsesions which require 3+ hours per day of interdisciplinary therapy in a comprehensive inpatient rehab setting. Physiatrist is providing close team supervision and 24 hour management of active medical problems listed below. Physiatrist and rehab team continue to assess barriers to discharge/monitor patient progress toward functional and medical goals. FIM: FIM - Bathing Bathing Steps Patient Completed: Chest;Right Arm;Left Arm;Abdomen;Right upper leg;Left upper leg;Front perineal area;Buttocks Bathing: 4: Min-Patient completes 8-9 8f 10 parts or 75+ percent  FIM - Upper Body Dressing/Undressing Upper body dressing/undressing steps patient completed: Thread/unthread right sleeve of pullover shirt/dresss;Thread/unthread left sleeve of pullover shirt/dress;Put head through opening of pull over shirt/dress;Pull shirt over trunk Upper body dressing/undressing: 5: Set-up assist to: Obtain clothing/put away FIM - Lower Body Dressing/Undressing Lower body dressing/undressing steps patient completed: Thread/unthread left pants leg;Thread/unthread right pants leg;Pull underwear up/down;Thread/unthread left underwear leg;Thread/unthread right underwear leg;Pull pants up/down Lower body dressing/undressing: 3: Mod-Patient completed 50-74% of tasks  FIM - Toileting Toileting steps completed by patient: Adjust clothing prior to toileting;Performs perineal hygiene;Adjust clothing after toileting Toileting Assistive Devices: Grab bar or rail for support Toileting: 4: Steadying assist  FIM - Diplomatic Services operational officer Devices: Elevated toilet seat;Grab bars;Walker Toilet Transfers: 5-To toilet/BSC: Supervision (verbal cues/safety issues);5-From toilet/BSC: Supervision (verbal cues/safety issues)  FIM - Bed/Chair Transfer Bed/Chair Transfer Assistive Devices: Bed rails;Arm rests;Walker Bed/Chair Transfer: 4: Chair or W/C > Bed: Min A (steadying Pt. > 75%);3: Sit  > Supine: Mod A (lifting assist/Pt. 50-74%/lift 2  legs)  FIM - Locomotion: Wheelchair Locomotion: Wheelchair: 2: Travels 50 - 149 ft with supervision, cueing or coaxing FIM - Locomotion: Ambulation Locomotion: Ambulation Assistive Devices: Designer, industrial/product Ambulation/Gait Assistance: 4: Min guard Locomotion: Ambulation: 2: Travels 50 - 149 ft with minimal assistance (Pt.>75%)  Comprehension Comprehension Mode: Auditory Comprehension: 6-Follows complex conversation/direction: With extra time/assistive device  Expression Expression Mode: Verbal Expression: 6-Expresses complex ideas: With extra time/assistive device  Social Interaction Social Interaction: 6-Interacts appropriately with others with medication or extra time (anti-anxiety, antidepressant).  Problem Solving Problem Solving: 6-Solves complex problems: With extra time  Memory Memory: 6-More than reasonable amt of time  Medical Problem List and Plan:  Deconditioning due to incarcerated hernia with SBO, cholelithiasis with acute and chronically inflamed GB--requiring L, 1. DVT Prophylaxis/Anticoagulation: Pharmaceutical: Lovenox  2. Pain Management: tylenol for now. No substantial pain at present. Local remedies if possible also  3. Mood: Continue bupropion daily. Motivated to get better--remains positive 4. Neuropsych: This patient is capable of making decisions on her own behalf.  5. 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  -scheduled imodium added, stools more formed as a whole but inconsistent still -low fiber diet  6. NSTEMI due to demand ischemia:  7. ABLA: transfused Friday due to symptomatic anemia  -Continue iron supplement  -hgb 9.8 today  -continue to follow serially 8. A Fib: In NSR on amiodarone and coreg. Continue to monitor heart rate on bid basis.  9. Cardiomyopathy:   weights balanced. Low salt diet to avoid recurrent volume overload. On coreg, Lipitor and  lisinopril.  -I's O's + but weights are down  -continue current regimen    LOS (Days) 7 A FACE TO FACE EVALUATION WAS PERFORMED  Angalina Ante T 04/09/2013 7:21 AM

## 2013-04-10 ENCOUNTER — Encounter: Payer: Self-pay | Admitting: Internal Medicine

## 2013-04-10 ENCOUNTER — Inpatient Hospital Stay (HOSPITAL_COMMUNITY): Payer: Medicare Other | Admitting: Physical Therapy

## 2013-04-10 ENCOUNTER — Inpatient Hospital Stay (HOSPITAL_COMMUNITY): Payer: Medicare Other

## 2013-04-10 LAB — CBC WITH DIFFERENTIAL/PLATELET
Basophils Absolute: 0 10*3/uL (ref 0.0–0.1)
Basophils Relative: 0 % (ref 0–1)
Eosinophils Absolute: 0.2 10*3/uL (ref 0.0–0.7)
Hemoglobin: 9.4 g/dL — ABNORMAL LOW (ref 12.0–15.0)
Lymphocytes Relative: 12 % (ref 12–46)
MCH: 30.8 pg (ref 26.0–34.0)
MCHC: 33 g/dL (ref 30.0–36.0)
Monocytes Relative: 14 % — ABNORMAL HIGH (ref 3–12)
Neutro Abs: 2.8 10*3/uL (ref 1.7–7.7)
Neutrophils Relative %: 68 % (ref 43–77)
RDW: 17 % — ABNORMAL HIGH (ref 11.5–15.5)

## 2013-04-10 LAB — COMPREHENSIVE METABOLIC PANEL
ALT: 6 U/L (ref 0–35)
AST: 12 U/L (ref 0–37)
Alkaline Phosphatase: 50 U/L (ref 39–117)
CO2: 28 mEq/L (ref 19–32)
Chloride: 102 mEq/L (ref 96–112)
GFR calc Af Amer: 60 mL/min — ABNORMAL LOW (ref >90)
GFR calc non Af Amer: 52 mL/min — ABNORMAL LOW (ref >90)
Glucose, Bld: 93 mg/dL (ref 70–99)
Potassium: 4.4 mEq/L (ref 3.5–5.1)
Sodium: 137 mEq/L (ref 135–145)
Total Bilirubin: 0.4 mg/dL (ref 0.3–1.2)

## 2013-04-10 NOTE — Progress Notes (Signed)
NUTRITION FOLLOW-UP  DOCUMENTATION CODES Per approved criteria  -Severe malnutrition in the context of chronic illness   INTERVENTION: Continue Ensure Pudding po TID, each supplement provides 170 kcal and 4 grams of protein and 1 scoop Beneprotein TID. RD provided diet education regarding "Low Fiber Nutrition Therapy" and "Low Sodium Nutrition Therapy" on 11/13. RD to continue to follow nutrition care plan.  NUTRITION DIAGNOSIS: Inadequate oral intake r/t GI distress AEB weight loss and pt report. Improving.  Goal: Intake to meet >90% of estimated nutrition needs. Met.  Monitor:  weight trends, lab trends, I/O's, PO intake, supplement tolerance  ASSESSMENT: PMHx significant for PAF, CMV colitis with chronic diarrhea. Admitted 10/31 with poor PO's, vomiting and inability to keep foods down. Work-up revealed SBO with incarcerated umbilical hernia. Underwent lab ventral hernia repair with mesh, lysis of adhesions and lap chole with partial omentectomy on 11/3. Diarrhea slowly improving, c diff negative.   Currently ordered for a Heart Healthy diet with "Low Fiber" restrictions. Eating 75-100% of meals.  Pt is consuming her Beneprotein and Ensure Pudding as scheduled. Asking RD to provide information on where to purchase Ensure Pudding when d/c - RD provided information.  Planning for d/c tomorrow.  Patient meets criteria for severe malnutrition related to chronic illness AEB 12% weight loss in the past 6 months and intake <75% for > 1 month.   Height: Ht Readings from Last 1 Encounters:  03/29/13 5\' 4"  (1.626 m)    Weight: Wt Readings from Last 1 Encounters:  04/10/13 174 lb 2.6 oz (79 kg)  Admit wt 182 lb - wt trending down  BMI:  Body mass index is 29.88 kg/(m^2). Overweight  Estimated Nutritional Needs: Kcal: 1600-1800 Protein: 90-100 gms Fluid: 1.95L daily  Skin:  stage II pressure ulcer to R buttocks  Diet Order: Cardiac with Low Fiber  restrictions    Intake/Output Summary (Last 24 hours) at 04/10/13 1026 Last data filed at 04/10/13 0800  Gross per 24 hour  Intake   1080 ml  Output      2 ml  Net   1078 ml    Last BM: 11/13  Labs:     Recent Labs Lab 04/04/13 0500 04/05/13 0505 04/08/13 0513 04/10/13 0510  NA  --  139 135 137  K  --  4.4 4.4 4.4  CL  --  108 102 102  CO2  --  23 27 28   BUN  --  8 10 11   CREATININE  --  0.84 0.85 0.96  CALCIUM  --  7.5* 8.3* 8.3*  MG 1.9  --   --   --   GLUCOSE  --  98 86 93    CBG (last 3)  No results found for this basename: GLUCAP,  in the last 72 hours  Scheduled Meds: . amiodarone  200 mg Oral Daily  . antiseptic oral rinse  15 mL Mouth Rinse q12n4p  . aspirin  81 mg Oral Daily  . atorvastatin  20 mg Oral Q supper  . buPROPion  150 mg Oral Daily  . carvedilol  6.25 mg Oral BID WC  . chlorhexidine  15 mL Mouth Rinse BID  . cyanocobalamin  1,000 mcg Intramuscular Q30 days  . enoxaparin (LOVENOX) injection  40 mg Subcutaneous Q24H  . famotidine  20 mg Oral BID  . feeding supplement (ENSURE)  1 Container Oral TID BM  . ferrous sulfate  325 mg Oral TID WC  . furosemide  20 mg Intravenous Once  .  furosemide  20 mg Oral Daily  . levothyroxine  125 mcg Oral QAC breakfast  . lip balm   Topical BID  . loperamide  2 mg Oral BID  . multivitamin with minerals  1 tablet Oral Daily  . mycophenolate  1,000 mg Oral BID  . potassium chloride  10 mEq Oral Daily  . protein supplement  1 scoop Oral TID WC  . psyllium  1 packet Oral BID  . saccharomyces boulardii  500 mg Oral BID  . vitamin C  500 mg Oral Daily    Continuous Infusions: none    Jarold Motto MS, RD, LDN Pager: [phone] After-hours pager: [phone]

## 2013-04-10 NOTE — Progress Notes (Signed)
Physical Therapy Note  Patient Details  Name: [name] MRN: [id number] Date of Birth: 07-31-1924 Today's Date: 04/10/2013  0855- 0950 (55 minutes) individual Pain: no complaint of pain Other: BP (sitting) 91/50 , standing 76/48 (abdominal binder applied), standing post abdominal binder / TEDS 91/59 with minimal c/o dizziness Focus of treatment : Bed mobility, transfer training including car, gait training/endurance; therapeutic exercise focused on activity tolerance Treatment: Pt in recliner upon arrival (see BP values above) ; transfer wc to bed SBA setup stand/turn RW ; sit >< supine std.bed SBA ; wc mobility 150 feet with vcs for managing steering through narrow door; transfer wc > car (practice) SBA RW ; gait 160 feet RW SBA; Nustep Level 4 X 10 minutes for activity tolerance.BP sitting 113/70 post session; returned to room with all needs wlithin reach.   1330-1410 (40 minutes) individual Pain: no reported pain Focus of treatment : reviewing safety issues for DC (standing balance, gait with AD); standing balance Treatment: transfer stand /pivot with bilateral UE support recliner to wc; standing balance; static standing close SBA with min sway at one minute; dynamic standing with feet at shoulder width and in tandem (reaching) close SBA ; single stance with unilateral UE support - unable to lift feet with bilateral UE support; marching in place with RW support for hip flexor strengthening X 10.    Meagan Spease,JIM 04/10/2013, 9:33 AM

## 2013-04-10 NOTE — Progress Notes (Signed)
Subjective/Complaints: Feeling well and stronger. No complaints today. Stools more regular. A 12 point review of systems has been performed and if not noted above is otherwise negative.   Objective: Vital Signs: Blood pressure 84/53, pulse 65, temperature 98.1 F (36.7 C), temperature source Oral, resp. rate 18, weight 79 kg (174 lb 2.6 oz), SpO2 97.00%. No results found.  Recent Labs  04/09/13 0515 04/10/13 0510  WBC 4.7 4.1  HGB 9.8* 9.4*  HCT 30.1* 28.5*  PLT 185 169    Recent Labs  04/08/13 0513 04/10/13 0510  NA 135 137  K 4.4 4.4  CL 102 102  GLUCOSE 86 93  BUN 10 11  CREATININE 0.85 0.96  CALCIUM 8.3* 8.3*   CBG (last 3)  No results found for this basename: GLUCAP,  in the last 72 hours  Wt Readings from Last 3 Encounters:  04/10/13 79 kg (174 lb 2.6 oz)  04/02/13 82.2 kg (181 lb 3.5 oz)  04/02/13 82.2 kg (181 lb 3.5 oz)    Physical Exam:  Constitutional: She appears well-developed and well-nourished. No distress  HENT: oral mucosa pink and moist, dentition fair/good  Head: Normocephalic.  Eyes: EOM are normal. Pupils are equal, round, and reactive to light.  Neck: Normal range of motion. Neck supple. No JVD present. No tracheal deviation present. No thyromegaly present.  Cardiovascular: Normal rate.  No murmur heard. No rubs, gallops  Respiratory: Effort normal. No respiratory distress. No wheezes  GI: She exhibits no distension. There is tenderness. Small incision sites well healed over abdomen. BS+ Musculoskeletal:  Edema tr in LE's and in UE's.    Neurological: She is alert. No cranial nerve deficit. Coordination normal.  UE's 4/5. LE's 4 HF, 4/5 KE, 4+/5 APF/ADF. No gross sensory deficits.  Psychiatric: She has a normal mood and affect. Her behavior is normal. Judgment and thought content normal.  Skin: mild gluteal breakdown stable   Assessment/Plan: 1. Functional deficits secondary to deconditioning related to an incarcerated small bowel,  s/p lap chole and lysis of adhsesions which require 3+ hours per day of interdisciplinary therapy in a comprehensive inpatient rehab setting. Physiatrist is providing close team supervision and 24 hour management of active medical problems listed below. Physiatrist and rehab team continue to assess barriers to discharge/monitor patient progress toward functional and medical goals. FIM: FIM - Bathing Bathing Steps Patient Completed: Chest;Right Arm;Left Arm;Abdomen;Front perineal area;Buttocks;Right upper leg;Left upper leg;Right lower leg (including foot);Left lower leg (including foot) (LH sponge) Bathing: 5: Supervision: Safety issues/verbal cues (using LH songe)  FIM - Upper Body Dressing/Undressing Upper body dressing/undressing steps patient completed: Thread/unthread right sleeve of pullover shirt/dresss;Thread/unthread left sleeve of pullover shirt/dress;Put head through opening of pull over shirt/dress;Pull shirt over trunk Upper body dressing/undressing: 5: Set-up assist to: Obtain clothing/put away FIM - Lower Body Dressing/Undressing Lower body dressing/undressing steps patient completed: Thread/unthread left pants leg;Thread/unthread right pants leg;Pull underwear up/down;Thread/unthread left underwear leg;Thread/unthread right underwear leg;Pull pants up/down Lower body dressing/undressing: 3: Mod-Patient completed 50-74% of tasks  FIM - Toileting Toileting steps completed by patient: Adjust clothing prior to toileting;Performs perineal hygiene;Adjust clothing after toileting Toileting Assistive Devices: Grab bar or rail for support Toileting: 5: Supervision: Safety issues/verbal cues  FIM - Diplomatic Services operational officer Devices: Elevated toilet seat;Grab bars;Walker Toilet Transfers: 5-To toilet/BSC: Supervision (verbal cues/safety issues);5-From toilet/BSC: Supervision (verbal cues/safety issues)  FIM - Banker Devices:  Walker;Arm rests Bed/Chair Transfer: 5: Chair or W/C > Bed: Supervision (verbal cues/safety issues);3: Sit >  Supine: Mod A (lifting assist/Pt. 50-74%/lift 2 legs)  FIM - Locomotion: Wheelchair Locomotion: Wheelchair: 2: Travels 50 - 149 ft with supervision, cueing or coaxing FIM - Locomotion: Ambulation Locomotion: Ambulation Assistive Devices: Designer, industrial/product Ambulation/Gait Assistance: 4: Min guard Locomotion: Ambulation: 4: Travels 150 ft or more with minimal assistance (Pt.>75%)  Comprehension Comprehension Mode: Auditory Comprehension: 6-Follows complex conversation/direction: With extra time/assistive device  Expression Expression Mode: Verbal Expression: 6-Expresses complex ideas: With extra time/assistive device  Social Interaction Social Interaction: 6-Interacts appropriately with others with medication or extra time (anti-anxiety, antidepressant).  Problem Solving Problem Solving: 6-Solves complex problems: With extra time  Memory Memory: 6-More than reasonable amt of time  Medical Problem List and Plan:  Deconditioning due to incarcerated hernia with SBO, cholelithiasis with acute and chronically inflamed GB--requiring L, 1. DVT Prophylaxis/Anticoagulation: Pharmaceutical: Lovenox  2. Pain Management: tylenol for now. No substantial pain at present. Local remedies if possible also  3. Mood: Continue bupropion daily. Motivated to get better--remains positive 4. Neuropsych: This patient is capable of making decisions on her own behalf.  5. 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  -scheduled imodium added, stools more formed as a whole but inconsistent still -low fiber diet  6. NSTEMI due to demand ischemia:  7. ABLA: transfused Friday due to symptomatic anemia  -Continue iron supplement  -hgb 9.4 today  -will  Need follow up labs at home--surgical follow up 8. A Fib: In NSR on amiodarone and coreg. Continue to monitor heart  rate on bid basis.  9. Cardiomyopathy:   weights balanced. Low salt diet to avoid recurrent volume overload. On coreg, Lipitor and lisinopril.   -continue current regimen    LOS (Days) 8 A FACE TO FACE EVALUATION WAS PERFORMED  Quadre Bristol T 04/10/2013 7:54 AM

## 2013-04-10 NOTE — Progress Notes (Signed)
Occupational Therapy Discharge Summary  Patient Details  Name: Cerria Randhawa MRN: [id number] Date of Birth: 1924/09/02  Today's Date: 04/10/2013  Patient has met 6 of 7 long term goals due to improved activity tolerance, improved balance, ability to compensate for deficits and improved awareness. Patient to discharge at overall Supervision level.  Patient's care partner is independent to provide the necessary supervision/setup assistance at discharge.    Reasons goals not met: n/a  Recommendation:  Patient will benefit from ongoing skilled OT services in home health setting to continue to advance functional skills in the area of BADL.  Equipment: No equipment provided  Reasons for discharge: treatment goals met  Patient/family agrees with progress made and goals achieved: Yes  OT Discharge Precautions/Restrictions  Precautions Precautions: Fall Restrictions Weight Bearing Restrictions: No  Vital Signs Therapy Vitals Temp: 98.1 F (36.7 C) Temp src: Oral Pulse Rate: 72 Resp: 18 BP: 112/70 mmHg Patient Position, if appropriate: Sitting Oxygen Therapy SpO2: 97 % O2 Device: None (Room air)  Pain Pain Assessment Pain Assessment: No/denies pain Pain Score: 0-No pain  ADL ADL Equipment Provided: Long-handled sponge Eating: Independent Where Assessed-Eating: Chair Grooming: Independent Where Assessed-Grooming: Sitting at sink Upper Body Bathing: Supervision/safety Where Assessed-Upper Body Bathing: Shower Lower Body Bathing: Supervision/safety Where Assessed-Lower Body Bathing: Shower Upper Body Dressing: Supervision/safety Where Assessed-Upper Body Dressing: Wheelchair Lower Body Dressing: Supervision/safety Where Assessed-Lower Body Dressing: Wheelchair Toileting: Modified independent Where Assessed-Toileting: Teacher, adult education: Modified Community education officer Method: Surveyor, minerals: Grab bars;Bedside commode (used over  toilet to elevate seat) Film/video editor: Close supervision Film/video editor Method: Warden/ranger: Emergency planning/management officer  Vision/Perception  Vision - History Baseline Vision: Wears glasses all the time Patient Visual Report: No change from baseline Vision - Assessment Eye Alignment: Within Functional Limits Perception Perception: Within Functional Limits Praxis Praxis: Intact   Cognition Overall Cognitive Status: Within Functional Limits for tasks assessed Arousal/Alertness: Awake/alert Orientation Level: Oriented X4 Attention: Alternating Sustained Attention: Appears intact Alternating Attention: Appears intact Memory: Appears intact Awareness: Appears intact Problem Solving: Appears intact Safety/Judgment: Appears intact  Sensation Sensation Light Touch: Appears Intact Stereognosis: Appears Intact Hot/Cold: Appears Intact Proprioception: Appears Intact Additional Comments: BUE sensation appears intact Coordination Gross Motor Movements are Fluid and Coordinated: Yes Fine Motor Movements are Fluid and Coordinated: Yes  Motor  Motor Motor: Within Functional Limits  Mobility  Bed Mobility Bed Mobility: Rolling Right Rolling Right: 5: Supervision Supine to Sit: 6: Modified independent (Device/Increase time) Supine to Sit Details: Verbal cues for safe use of DME/AE Sitting - Scoot to Edge of Bed: 7: Independent Sit to Supine: 6: Modified independent (Device/Increase time) Transfers Transfers: Sit to Stand;Stand to Sit Sit to Stand: 6: Modified independent (Device/Increase time);From elevated surface Stand to Sit: 5: Supervision   Trunk/Postural Assessment  Cervical Assessment Cervical Assessment: Within Functional Limits Thoracic Assessment Thoracic Assessment: Within Functional Limits Lumbar Assessment Lumbar Assessment: Within Functional Limits Postural Control Postural Control: Within Functional Limits    Balance Static Sitting Balance Static Sitting - Level of Assistance: 7: Independent Dynamic Sitting Balance Dynamic Sitting - Balance Support: No upper extremity supported;Feet unsupported Dynamic Sitting - Level of Assistance: 7: Independent Dynamic Sitting - Balance Activities: Reaching for weighted objects Static Standing Balance Static Standing - Level of Assistance: 5: Stand by assistance Static Standing - Comment/# of Minutes: 1 minute Dynamic Standing Balance Dynamic Standing - Balance Support: Bilateral upper extremity supported Dynamic Standing - Level of Assistance: 5: Stand by assistance  Dynamic Standing - Comments: unable to perform single leg stance without bilateral UE support  Extremity/Trunk Assessment RUE Assessment RUE Assessment: Within Functional Limits RUE AROM (degrees) Overall AROM Right Upper Extremity: Within functional limits for tasks performed RUE Strength RUE Overall Strength: Within Functional Limits for tasks performed RUE Tone RUE Tone: Within Functional Limits LUE Assessment LUE Assessment: Within Functional Limits LUE AROM (degrees) Overall AROM Left Upper Extremity: Within functional limits for tasks assessed LUE Strength LUE Overall Strength: Within Functional Limits for tasks assessed LUE Tone LUE Tone: Within Functional Limits  See FIM for current functional status  Einer Meals 04/10/2013, 3:59 PM

## 2013-04-10 NOTE — Progress Notes (Signed)
Occupational Therapy Session Note  Patient Details  Name: [name] MRN: [id number] Date of Birth: [birth year]  Today's Date: 04/10/2013 Time: 0730-0826 Time Calculation (min): 56 min  Short Term Goals: Week 1:  OT Short Term Goal 1 (Week 1): Patient will complete UB/LB bathing with minimal assistance OT Short Term Goal 2 (Week 1): Patient will complete LB dressing with minimal assistance OT Short Term Goal 3 (Week 1): Patient will complete toileting tasks (peri care & clothing management) with moderate assistance OT Short Term Goal 4 (Week 1): Patient will complete shower stall transfers with minimal assistance  Skilled Therapeutic Interventions/Progress Updates: ADL-retraining with emphasis on discharge planning, functional mobility using RW during ADL, transfers (bed, toilet, shower), and energy conservation.   Patient used RW to retrieve clothing items, transferred to chair to prepare undergarments, and ambulated to shower to perform bathing and dressing, sitting and standing, with distant supervision and setup for additional supplies needed.   Patient used LH sponge for her feet and back while bathing but donned her socks unassisted this session while sitting in her w/c.   Patient stated that she does not plan to wear shoes at home due to difficulty with donning shoes caused by chronic unresolved edema in both feet, making her shoes too tight to wear comfortably.   Patient reports plan to purchase new footwear.   Patient completed seated grooming and ambulated to her recliner to eat her breakfast, independently.  Call light and phone placed within reach at end of session.     Therapy Documentation Precautions:  Precautions Precautions: Fall Precaution Comments: monitor BP, previously patient has been orthostatic Restrictions Weight Bearing Restrictions: No  Vital Signs: Therapy Vitals Temp: 98.1 F (36.7 C) Temp src: Oral Pulse Rate: 65 Resp: 18 BP: 84/53 mmHg Patient  Position, if appropriate: Standing Oxygen Therapy SpO2: 97 % O2 Device: None (Room air)  Pain: Pain Assessment Pain Assessment: No/denies pain  See FIM for current functional status  Therapy/Group: Individual Therapy  Second session: Time: 1430-1500 Time Calculation (min):  30 min  Pain Assessment: No pain  Skilled Therapeutic Interventions: Therapeutic activity with emphasis on functional mobility using RW, dynamic standing balance (to prepare a cup of tea in family room), transfers from low couch, and endurance.   Patient ambulated approx 100' using RW to family room, required mod assist to prepare a cup of tea due to being unfamiliar with automated equipment, min assist to transfer to low couch and max assist to rise from couch to RW to transfer back to w/c.   Patient demonstrated improved awareness of need for transfer to higher surfaces after struggling from low couch.   During preparation of tea, patient required min verbal cues to use counter top for stability after mild LOB from reaching beyond her base of support.   Patient was escorted back to her room in w/c where her daughter was waiting to visit.   Daughter was educated on patient's endurance deficits, her difficulty with low transfers, and her need for distant supervision during ADL for safety and setup assist.  See FIM for current functional status  Therapy/Group: Individual Therapy  Ellean Firman 04/10/2013, 8:27 AM

## 2013-04-11 DIAGNOSIS — K56609 Unspecified intestinal obstruction, unspecified as to partial versus complete obstruction: Secondary | ICD-10-CM

## 2013-04-11 DIAGNOSIS — D62 Acute posthemorrhagic anemia: Secondary | ICD-10-CM

## 2013-04-11 DIAGNOSIS — R5381 Other malaise: Secondary | ICD-10-CM

## 2013-04-11 MED ORDER — PSYLLIUM 95 % PO PACK: 1.0000 | PACK | Freq: Two times a day (BID) | ORAL | Status: DC

## 2013-04-11 MED ORDER — LOPERAMIDE HCL 2 MG PO CAPS: 2.0000 mg | ORAL_CAPSULE | Freq: Two times a day (BID) | ORAL | Status: DC

## 2013-04-11 MED ORDER — AMIODARONE HCL 200 MG PO TABS: 200.0000 mg | ORAL_TABLET | Freq: Every day | ORAL | Status: DC

## 2013-04-11 MED ORDER — POTASSIUM CHLORIDE ER 10 MEQ PO TBCR: 20.0000 meq | EXTENDED_RELEASE_TABLET | Freq: Every day | ORAL | Status: DC

## 2013-04-11 MED ORDER — FUROSEMIDE 20 MG PO TABS: 20.0000 mg | ORAL_TABLET | Freq: Every day | ORAL | Status: DC | PRN

## 2013-04-11 MED ORDER — ASPIRIN 81 MG PO CHEW: 81.0000 mg | CHEWABLE_TABLET | Freq: Every day | ORAL | Status: AC

## 2013-04-11 MED ORDER — FERROUS SULFATE 325 (65 FE) MG PO TABS: 325.0000 mg | ORAL_TABLET | Freq: Three times a day (TID) | ORAL | Status: DC

## 2013-04-11 MED ORDER — HEPARIN SOD (PORK) LOCK FLUSH 100 UNIT/ML IV SOLN
500.0000 [IU] | INTRAVENOUS | Status: AC | PRN
  Administered 2013-04-11: 500 [IU]

## 2013-04-11 MED ORDER — CARVEDILOL 6.25 MG PO TABS: 6.2500 mg | ORAL_TABLET | Freq: Two times a day (BID) | ORAL | Status: DC

## 2013-04-11 NOTE — Progress Notes (Signed)
Subjective/Complaints: No new complaints. Stool consistency and pattern better. Her ride will pick her up this afternoon A 12 point review of systems has been performed and if not noted above is otherwise negative.   Objective: Vital Signs: Blood pressure 92/54, pulse 53, temperature 98 F (36.7 C), temperature source Oral, resp. rate 18, weight 79.2 kg (174 lb 9.7 oz), SpO2 99.00%. No results found.  Recent Labs  04/09/13 0515 04/10/13 0510  WBC 4.7 4.1  HGB 9.8* 9.4*  HCT 30.1* 28.5*  PLT 185 169    Recent Labs  04/10/13 0510  NA 137  K 4.4  CL 102  GLUCOSE 93  BUN 11  CREATININE 0.96  CALCIUM 8.3*   CBG (last 3)  No results found for this basename: GLUCAP,  in the last 72 hours  Wt Readings from Last 3 Encounters:  04/11/13 79.2 kg (174 lb 9.7 oz)  04/02/13 82.2 kg (181 lb 3.5 oz)  04/02/13 82.2 kg (181 lb 3.5 oz)    Physical Exam:  Constitutional: She appears well-developed and well-nourished. No distress  HENT: oral mucosa pink and moist, dentition fair/good  Head: Normocephalic.  Eyes: EOM are normal. Pupils are equal, round, and reactive to light.  Neck: Normal range of motion. Neck supple. No JVD present. No tracheal deviation present. No thyromegaly present.  Cardiovascular: Normal rate.  No murmur heard. No rubs, gallops  Respiratory: Effort normal. No respiratory distress. No wheezes  GI: She exhibits no distension. There is tenderness. Small incision sites well healed over abdomen. BS+ Musculoskeletal:  Edema tr in LE's and in UE's.    Neurological: She is alert. No cranial nerve deficit. Coordination normal.  UE's 4/5. LE's 4 HF, 4/5 KE, 4+/5 APF/ADF. No gross sensory deficits.  Psychiatric: She has a normal mood and affect. Her behavior is normal. Judgment and thought content normal.  Skin: mild gluteal breakdown stable   Assessment/Plan: 1. Functional deficits secondary to deconditioning related to an incarcerated small bowel, s/p lap  chole and lysis of adhsesions which require 3+ hours per day of interdisciplinary therapy in a comprehensive inpatient rehab setting. Physiatrist is providing close team supervision and 24 hour management of active medical problems listed below. Physiatrist and rehab team continue to assess barriers to discharge/monitor patient progress toward functional and medical goals.  DC home today with HH PT, OT, RN FIM: FIM - Bathing Bathing Steps Patient Completed: Chest;Right Arm;Left Arm;Abdomen;Front perineal area;Buttocks;Right upper leg;Left upper leg;Right lower leg (including foot);Left lower leg (including foot) Bathing: 5: Supervision: Safety issues/verbal cues  FIM - Upper Body Dressing/Undressing Upper body dressing/undressing steps patient completed: Thread/unthread right sleeve of pullover shirt/dresss;Thread/unthread left sleeve of pullover shirt/dress;Put head through opening of pull over shirt/dress;Pull shirt over trunk Upper body dressing/undressing: 5: Supervision: Safety issues/verbal cues FIM - Lower Body Dressing/Undressing Lower body dressing/undressing steps patient completed: Thread/unthread right underwear leg;Thread/unthread left underwear leg;Pull underwear up/down;Thread/unthread right pants leg;Thread/unthread left pants leg;Pull pants up/down;Don/Doff right sock;Don/Doff left sock Lower body dressing/undressing: 5: Supervision: Safety issues/verbal cues  FIM - Toileting Toileting steps completed by patient: Adjust clothing prior to toileting;Performs perineal hygiene;Adjust clothing after toileting Toileting Assistive Devices: Grab bar or rail for support Toileting: 6: More than reasonable amount of time  FIM - Diplomatic Services operational officer Devices: Elevated toilet seat;Grab bars;Walker Toilet Transfers: 5-To toilet/BSC: Supervision (verbal cues/safety issues);5-From toilet/BSC: Supervision (verbal cues/safety issues)  FIM - Event organiser Devices: Walker;Bed rails Bed/Chair Transfer: 6: Assistive device: no helper (bedrail required to  simulate night stand at home)  FIM - Locomotion: Wheelchair Distance: 150 Locomotion: Wheelchair: 2: Travels 50 - 149 ft with supervision, cueing or coaxing FIM - Locomotion: Ambulation Locomotion: Ambulation Assistive Devices: Designer, industrial/product Ambulation/Gait Assistance: 5: Supervision Locomotion: Ambulation: 4: Travels 150 ft or more with minimal assistance (Pt.>75%)  Comprehension Comprehension Mode: Auditory Comprehension: 6-Follows complex conversation/direction: With extra time/assistive device  Expression Expression Mode: Verbal Expression: 6-Expresses complex ideas: With extra time/assistive device  Social Interaction Social Interaction: 6-Interacts appropriately with others with medication or extra time (anti-anxiety, antidepressant).  Problem Solving Problem Solving: 5-Solves complex 90% of the time/cues < 10% of the time  Memory Memory: 6-Assistive device: No helper  Medical Problem List and Plan:  Deconditioning due to incarcerated hernia with SBO, cholelithiasis with acute and chronically inflamed GB- 1. DVT Prophylaxis/Anticoagulation: Pharmaceutical: Lovenox  2. Pain Management: tylenol for now. No substantial pain at present. Local remedies if possible also  3. Mood: Continue bupropion daily. Motivated to get better--remains positive 4. Neuropsych: This patient is capable of making decisions on her own behalf.  5. 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   -scheduled imodium added, stools more formed as a whole but inconsistent still  -low fiber diet  6. NSTEMI due to demand ischemia:  7. ABLA: transfused last week  -Continue iron supplement  -hgb 9.4   -will  Need follow up labs at home and surgical follow up 8. A Fib: In NSR on amiodarone and coreg. Continue to  monitor heart rate on bid basis.  9. Cardiomyopathy:   weights balanced. Low salt diet to avoid recurrent volume overload. On coreg, Lipitor and lisinopril.   -continue current regimen    LOS (Days) 9 A FACE TO FACE EVALUATION WAS PERFORMED  Wen Merced T 04/11/2013 7:50 AM

## 2013-04-11 NOTE — Discharge Summary (Signed)
Physician Discharge Summary  Patient ID: [name] MRN: [id number] DOB/AGE: 1924/07/21 79 y.o.  Admit date: 04/02/2013 Discharge date: 04/11/2013  Discharge Diagnoses:  Principal Problem:   Physical deconditioning Active Problems:   Incisional hernia, incarcerated s/p lap repair 03/25/2013   NSTEMI (non-ST elevated myocardial infarction)   Anemia of chronic disease   Pedal edema   Discharged Condition:  Stable.  Significant Diagnostic Studies:  N/A  Labs:  Basic Metabolic Panel:  Recent Labs Lab 04/05/13 0505 04/08/13 0513 04/10/13 0510  NA 139 135 137  K 4.4 4.4 4.4  CL 108 102 102  CO2 23 27 28   GLUCOSE 98 86 93  BUN 8 10 11   CREATININE 0.84 0.85 0.96  CALCIUM 7.5* 8.3* 8.3*    CBC:  Recent Labs Lab 04/08/13 0513 04/09/13 0515 04/10/13 0510  WBC 4.2 4.7 4.1  NEUTROABS 2.8 3.2 2.8  HGB 9.7* 9.8* 9.4*  HCT 29.3* 30.1* 28.5*  MCV 92.1 93.2 93.4  PLT 171 185 169    CBG: No results found for this basename: GLUCAP,  in the last 168 hours  Brief HPI:   [name] is a 79 y.o. female with history of PAF, CMV colitis with chronic diarrhea/minimal nausea. Admitted on 03/22/13 with poor po intake, bilious vomiting and inability to keep anything now. She was found to have SBO with incarcerated umbilical hernia and NGT placed. Cardiology consulted for management of A Fib with NSTEMI due to demand ischemia. 2D echo done revealing decrease in EF to 30-35% with question of Takasubo CM due to rapid A fib. . On 11/03/1, she underwent Lap ventral hernia repair with mesh, lysis of adhesions and lap chole with partial omentectomy by Dr. Michaell Cowing. Post op fluid overload treated with diuretics. ABLA being monitored--hgb has been variable in 7.3- 7.9 range. Cardiology recommends follow up in 2 weeks past discharge for discussion regarding NOAC due to multiple embolic risk factors as well as follow up echo.    Hospital Course: [name] was admitted to rehab  04/02/2013 for inpatient therapies to consist of PT, ST and OT at least three hours five days a week. Past admission physiatrist, therapy team and rehab RN have worked together to provide customized collaborative inpatient rehab. Pain control has been reasonable and abdominal wound has healed well without s/s of infection.  Stool consistency and pattern has improved with scheduled fiber and lomotil. Peripheral edema has decreased. She has had problems with hypotension and dizziness due to low BP. Ace was discontinued and lasix was tapered to 20 mg daily.  Cardiology was contacted for input and recommends having patient use lasix daily prn increase in weight by 2lbs or 5 lbs/week. Patient was educated on low salt diet as well as daily weight monitoring. She was transfused with 2 units PRBC for symptomatic anemia. H/H has been monitored and has shown some drop. Repeat CBC to be drawn by [facility] with results to primary MD on Monday. No outward signs of bleeding noted. Patient's energy levels have improved greatly with minimal orthostasis. She is to follow up with cardiology for further adjustment of medications.   Rehab course: During patient's stay in rehab weekly team conferences were held to monitor patient's progress, set goals and discuss barriers to discharge.  Patient has had improvement in activity tolerance, balance, postural control, as well as ability to compensate for deficits. She require supervision  for transfers and mobility. She is able to ambulate 150 feet with rolling walker. She requires supervision for bathing and  dressing tasks. She will continue to receive HHPT past discharge.    Disposition: Home.   Diet:  Low residue. Low salt.  Avoid raw fruits and vegetables.   Special Instructions: 1.  Wound Care: Keep abdominal wound clean and dry--call Dr. Michaell Cowing for redness, pain, drainage, swelling, fever or chills.  Keep foam dressing on sacral area. Change if soiled.  2. Wear support stockings  daily. 3. Weight yourself daily--first thing in morning. If weight goes up by 2 Lbs over night or 5 Lbs in a week--take one lasix 20 mg pill.      Future Appointments Provider Department Dept Phone   04/17/2013 10:00 AM Ardeth Sportsman, MD [facility], [place] [phone]   04/22/2013 2:20 PM Beatrice Lecher, PA-C [facility] [phone]   04/30/2013 10:00 AM Cliffton Asters, MD [facility] [phone]   05/01/2013 3:15 PM Nelwyn Salisbury, MD [facility] [phone]   10/16/2013 1:30 PM Nilda Riggs, NP [facility] [phone]       Medication List    STOP taking these medications       glucosamine-chondroitin 500-400 MG tablet     irbesartan-hydrochlorothiazide 300-12.5 MG per tablet  Commonly known as:  AVALIDE     torsemide 10 MG tablet  Commonly known as:  DEMADEX      TAKE these medications       ALPRAZolam 0.25 MG tablet  Commonly known as:  XANAX  Take 1 tablet (0.25 mg total) by mouth 2 (two) times daily as needed for anxiety.     amiodarone 200 MG tablet  Commonly known as:  PACERONE  Take 1 tablet (200 mg total) by mouth daily.     aspirin 81 MG chewable tablet  Chew 1 tablet (81 mg total) by mouth daily.     atorvastatin 20 MG tablet  Commonly known as:  LIPITOR  Take 1 tablet (20 mg total) by mouth daily with supper.     buPROPion 150 MG 24 hr tablet  Commonly known as:  WELLBUTRIN XL  Take 1 tablet (150 mg total) by mouth every morning.     carvedilol 6.25 MG tablet  Commonly known as:  COREG  Take 1 tablet (6.25 mg total) by mouth 2 (two) times daily with a meal.     co-enzyme Q-10 50 MG capsule  Take 1 capsule (50 mg total) by mouth at bedtime.     cyanocobalamin 1000 MCG/ML injection  Commonly known as:  (VITAMIN B-12)  Inject 1,000 mcg into the muscle every 30 (thirty) days.     feeding supplement (ENSURE COMPLETE) Liqd  Take 237  mLs by mouth daily.     ferrous sulfate 325 (65 FE) MG tablet  Take 1 tablet (325 mg total) by mouth 3 (three) times daily with meals.     fish oil-omega-3 fatty acids 1000 MG capsule  Take 1 capsule (1 g total) by mouth daily.     furosemide 20 MG tablet  Commonly known as:  LASIX  Take 1 tablet (20 mg total) by mouth daily as needed (take if weight goes up by 2 lbs overnight or 5 lbs in a week.).     levothyroxine 125 MCG tablet  Commonly known as:  SYNTHROID, LEVOTHROID  Take 1 tablet (125 mcg total) by mouth daily.     loperamide 2 MG capsule  Commonly known as:  IMODIUM  Take 1 capsule (2 mg total) by  mouth 2 (two) times daily.     multivitamin with minerals Tabs tablet  Take 1 tablet by mouth daily.     mycophenolate 500 MG tablet  Commonly known as:  CELLCEPT  Take 2 tablets (1,000 mg total) by mouth 2 (two) times daily.     potassium chloride 10 MEQ tablet  Commonly known as:  K-DUR  Take 2 tablets (20 mEq total) by mouth daily.     PROBIOTIC DAILY PO  Take 1 tablet by mouth daily.     psyllium 95 % Pack  Commonly known as:  HYDROCIL/METAMUCIL  Take 1 packet by mouth 2 (two) times daily.     ranitidine 150 MG tablet  Commonly known as:  ZANTAC  Take 150 mg by mouth daily.     vitamin C 250 MG tablet  Commonly known as:  ASCORBIC ACID  Take 250 mg by mouth daily.           Follow-up Information   Call Ranelle Oyster, MD. (As needed)    Specialty:  Physical Medicine and Rehabilitation   Contact information:   [address] [place] [address] [phone]       Follow up with Ardeth Sportsman., MD. Call on 04/17/2013. (be there at 10 am.)    Specialty:  General Surgery   Contact information:   [address] [place] [address] [phone]       Follow up with Tereso Newcomer, PA-C. Call on 04/22/2013. (Be there at 2:05 for 2:20 pm appointment)    Specialty:  Physician Assistant   Contact information:   [address] [place] [address] [phone]       Follow up with Nelwyn Salisbury, MD On 05/01/2013. (APPT: 3:15 PM)    Specialty:  Family Medicine   Contact information:   [address] [place] [address] [phone]       Signed: Jacquelynn Cree 04/11/2013, 1:14 PM

## 2013-04-11 NOTE — Progress Notes (Signed)
Patient discharge to home with daughter at 10.  RN provided discharge instructions, patient and daughter verbalized understanding, no further questions asked.  Patient escorted off unit by rehab NS.

## 2013-04-11 NOTE — Progress Notes (Signed)
Physical Therapy Discharge Summary  Patient Details  Name: [name] MRN: [id number] Date of Birth: 01-31-1925  Today's Date: 04/11/2013    Patient has met 9 of 10 long term goals due to improved activity tolerance, improved balance, increased strength and ability to compensate for deficits.  Patient to discharge at an ambulatory level Supervision.   Patient's care partner is independent to provide the necessary physical assistance at discharge.  Reasons goals not met: Pt did not meet modified independent wheelchair level goal in home environment because she still occasionally needs cues for negotiating obstacles however, pt was able to reach an ambulatory level and it is anticipated she will not spend much time in her wheelchair. Her son in-law will be providing 24 hour supervision at home.   Recommendation:  Patient will benefit from ongoing skilled PT services in home health setting to continue to advance safe functional mobility, address ongoing impairments in dynamic balance, endurance, BP management/OOB tolerance, skin and pressure relief management, and minimize fall risk.  Equipment: No equipment provided  Reasons for discharge: treatment goals met and discharge from hospital  Patient/family agrees with progress made and goals achieved: Yes  PT Discharge Precautions/Restrictions Precautions Precautions: Fall Precaution Comments: monitor BP, previously patient has been orthostatic Restrictions Weight Bearing Restrictions: No Vital Signs Therapy Vitals Temp: 98 F (36.7 C) Temp src: Oral Pulse Rate: 53 Resp: 18 BP: 92/54 mmHg Patient Position, if appropriate: Lying Oxygen Therapy SpO2: 99 % O2 Device: None (Room air)  Cognition Overall Cognitive Status: Within Functional Limits for tasks assessed Arousal/Alertness: Awake/alert Orientation Level: Oriented X4 Attention: Alternating Sustained Attention: Appears intact Alternating Attention: Appears  intact Memory: Appears intact Awareness: Appears intact Problem Solving: Appears intact Safety/Judgment: Appears intact Sensation Sensation Light Touch: Appears Intact Proprioception: Appears Intact Coordination Gross Motor Movements are Fluid and Coordinated: Yes Motor  Motor Motor: Within Functional Limits  Mobility Bed Mobility Bed Mobility: Supine to Sit Supine to Sit: 6: Modified independent (Device/Increase time) Supine to Sit Details: Verbal cues for safe use of DME/AE Sitting - Scoot to Edge of Bed: 7: Independent Transfers Sit to Stand: 6: Modified independent (Device/Increase time);From elevated surface Stand to Sit: 6: Modified independent (Device/Increase time) Stand Pivot Transfers: 5: Supervision Stand Pivot Transfer Details (indicate cue type and reason): supervision for safety Locomotion  Ambulation Ambulation/Gait Assistance: 5: Supervision Ambulation Distance (Feet): 160 Feet Assistive device: Rolling walker Gait Gait: Yes Gait Pattern: Impaired Gait Pattern: Step-through pattern;Trunk flexed (decreased foot clearance) Stairs / Additional Locomotion Stairs: Yes Stairs Assistance: 5: Supervision Stairs Assistance Details: Verbal cues for gait pattern;Verbal cues for precautions/safety;Verbal cues for sequencing Stair Management Technique: Two rails Number of Stairs: 8 Wheelchair Mobility Wheelchair Assistance: 6: Modified independent (Device/Increase time);5: Supervision (modified independent controlled environment, supervision with obstacles/home environment) Wheelchair Propulsion: Both upper extremities Distance: 150  Trunk/Postural Assessment  Cervical Assessment Cervical Assessment:  (forward head) Thoracic Assessment Thoracic Assessment:  (slightly kyphotic) Lumbar Assessment Lumbar Assessment: Within Functional Limits Postural Control Postural Control: Within Functional Limits  Balance Dynamic Sitting Balance Dynamic Sitting - Balance  Support: No upper extremity supported;Feet unsupported Dynamic Sitting - Level of Assistance: 7: Independent Dynamic Sitting - Balance Activities: Reaching for weighted objects Static Standing Balance Static Standing - Balance Support: Bilateral upper extremity supported Static Standing - Level of Assistance: 5: Stand by assistance Dynamic Standing Balance Dynamic Standing - Balance Support: Bilateral upper extremity supported Dynamic Standing - Level of Assistance: 5: Stand by assistance Dynamic Standing - Balance Activities: Lateral lean/weight shifting;Forward lean/weight  shifting;Reaching for objects Extremity Assessment      RLE Strength RLE Overall Strength: Other (Comment) (grossly 4/5 except hip flexion 3+/5) LLE Strength LLE Overall Strength: Other (Comment) (grossly 4/5)  See FIM for current functional status  Wilhemina Bonito 04/11/2013, 7:02 AM

## 2013-04-12 ENCOUNTER — Telehealth: Payer: Self-pay

## 2013-04-12 NOTE — Progress Notes (Signed)
Social Work  Discharge Note  The overall goal for the admission was met for:   Discharge location: Yes - home with daughter and son-in-law  Length of Stay: Yes - 9 days  Discharge activity level: Yes - distant supervision overall  Home/community participation: Yes  Services provided included: MD, RD, PT, OT, RN, TR, Pharmacy and SW  Financial Services: Medicare and Private Insurance: [employer]  Follow-up services arranged: Home Health: RN, PT via [facility] and Patient/Family has no preference for HH/DME agencies  Comments (or additional information):  Patient/Family verbalized understanding of follow-up arrangements: Yes  Individual responsible for coordination of the follow-up plan: patient  Confirmed correct DME delivered: NA    [name]

## 2013-04-12 NOTE — Telephone Encounter (Signed)
Susanne from [facility] called to a receive verbal order to resume Home Care and re certify patient after getting discharged from the hospital.

## 2013-04-16 ENCOUNTER — Telehealth: Payer: Self-pay

## 2013-04-16 NOTE — Telephone Encounter (Signed)
[name] with [facility] called to get verbal order to certify home health care.

## 2013-04-16 NOTE — Telephone Encounter (Signed)
Consider it now certified.  Thanks

## 2013-04-17 ENCOUNTER — Ambulatory Visit (INDEPENDENT_AMBULATORY_CARE_PROVIDER_SITE_OTHER): Payer: Medicare Other | Admitting: Surgery

## 2013-04-17 ENCOUNTER — Encounter (INDEPENDENT_AMBULATORY_CARE_PROVIDER_SITE_OTHER): Payer: Self-pay | Admitting: Surgery

## 2013-04-17 VITALS — BP 130/60 | HR 64 | Temp 98.4°F | Resp 14 | Ht 63.0 in | Wt 168.8 lb

## 2013-04-17 DIAGNOSIS — K430 Incisional hernia with obstruction, without gangrene: Secondary | ICD-10-CM

## 2013-04-17 NOTE — Telephone Encounter (Signed)
Rosalita Chessman aware Dr Riley Kill has certified home health.

## 2013-04-17 NOTE — Progress Notes (Signed)
Subjective:     Patient ID: [name], female   DOB: 1924-10-24, 79 y.o.   MRN: [id number]  HPI  [name]  1925-05-08 [id number]  Patient Care Team: Lauren Salisbury, MD as PCP - General  Procedure (Date: 03/24/2013):  POST-OPERATIVE DIAGNOSIS:  incarcerated incisional hernia (omentum > ileum)  Porcelain gallbladder  PROCEDURE: Procedure(s):  LAPAROSCOPIC VENTRAL HERNIA REPAIR  LAPAROSCOPIC CHOLECYSTECTOMY WITH INTRAOPERATIVE CHOLANGIOGRAM  INSERTION OF MESH  LAPAROSCOPIC LYSIS OF ADHESIONS  PARTIAL OMENTECTOMY  SURGEON: Surgeon(s):  Lauren Sportsman, MD   Diagnosis Gallbladder, with omentum - EXTENSIVELY HYALINIZED AND CALCIFIED GALLBLADDER WITH MIXED ACUTE AND CHRONIC INFLAMMATION. - NEGATIVE FOR DYSPLASIA OR MALIGNANCY. - CHOLELITHIASIS. - SEPARATELY SUBMITTED BENIGN OMENTAL TISSUE WITH VASCULAR CONGESTION, HEMORRHAGE, AND INFLAMMATION CONSISTENT WITH A HISTORY OF INCARCERATED HERNIA. [name] Pathologist, Electronic Signature (Case signed 03/27/2013) Specimen [name] and Clinical Information Specimen(s) Obtained: Gallbladder, with omentum Specimen Clinical Information Incarcerated hernia (je) [name] Received in formalin and consists of a 10.5 x 7.3 x 2.5 cm aggregate of yellow pink to red brown adipose and membranous soft tissue. Sectioning reveals unremarkable adipose tissue. Also received is a disrupted gallbladder, measuring 8.0 x 3.7 x 3.5 cm in aggregate. The serosal surface is tan pink and smooth, with a small amount of attached adipose tissue. The mucosa is tan yellow, and focally calcified. The wall is up to 0.8 cm in thickness. The gallbladder is impacted and the mucosa is focally adherent to a 4.2 x 3.4 x 2.4 cm tan green, crypt line, ovoid calculus. The cystic duct measures 0.5 cm in diameter and a 1.6 x 1.2 x 1.1 cm tan yellow, rounded calculus is impacted within the neck of the gallbladder. Block summary: A= representative sections of  omentum. B= gallbladder following decalcifications. (KL:gt, 03/26/13)  This patient returns for surgical re-evaluation.  She comes with her daughter.  She is currently feeling better.  The pain has gone away.  Appetite has come back.  Still very tired and deconditioned.  Transition to home health physical therapy.  Walking with a stroller.  Trying to Marks exercises in the chair.  In good spirits.  Patient Active Problem List   Diagnosis Date Noted  . Physical deconditioning 04/02/2013  . Pedal edema 04/01/2013  . Systolic CHF 03/27/2013  . Pulmonary hypertension 03/27/2013  . Hypokalemia 03/27/2013  . SBO (small bowel obstruction) 03/25/2013  . Ulcerative colitis   . Protein-calorie malnutrition, severe 03/24/2013  . Anemia of chronic disease 03/23/2013  . Acute respiratory failure with hypoxia 03/23/2013  . Atrial fibrillation with RVR 03/22/2013  . N&V (nausea and vomiting) 03/22/2013  . 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 Incisional hernia, incarcerated s/p lap repair 03/25/2013 03/22/2013  . NSTEMI (non-ST elevated myocardial infarction) 03/22/2013  . Acute renal failure 12/07/2012  . ANXIETY 12/26/2007  . HYPOTHYROIDISM 11/10/2006    Past Medical History  Diagnosis Date  . Anemia   . Hyperlipemia   . Hypertension   . Thyroid disease     hypothyroidism  . Anxiety   . Paroxysmal atrial fibrillation   . Vitamin B 12 deficiency   . CHF (congestive heart failure)   . TIA (transient ischemic attack)     4-09  . Microscopic hematuria     benign microscopic hematuria, worked up b [name]  . Gall stone     porcelain gall bladder with single stone  per CT 02-2009 [name]  . Osteoporosis     DEXA on  04-30-10  . Myasthenia gravis     sees Lauren. Terrace Marks   . Ulcerative colitis   . DVT of leg (deep venous thrombosis)     left leg   . CMV colitis   . Cancer     uterine/ovarian    Past Surgical History  Procedure Laterality Date  . Cardiac  catheterization      04/1999  . Abdominal hysterectomy      with oophorectomy  . Colonoscopy  1999    normal   . Colonoscopy N/A 12/10/2012    Procedure: COLONOSCOPY;  Surgeon: Lauren Boop, MD;  Location: [facility];  Service: Endoscopy;  Laterality: N/A;  . Flexible sigmoidoscopy N/A 01/26/2013    Procedure: FLEXIBLE SIGMOIDOSCOPY;  Surgeon: Lauren Boop, MD;  Location: [facility];  Service: Endoscopy;  Laterality: N/A;  . Ventral hernia repair N/A 03/25/2013    Procedure: LAPAROSCOPIC VENTRAL HERNIA REPAIR ;  Surgeon: Lauren Sportsman, MD;  Location: [facility];  Service: General;  Laterality: N/A;  . Cholecystectomy N/A 03/25/2013    Procedure: LAPAROSCOPIC CHOLECYSTECTOMY WITH INTRAOPERATIVE CHOLANGIOGRAM;  Surgeon: Lauren Sportsman, MD;  Location: [facility];  Service: General;  Laterality: N/A;  . Insertion of mesh N/A 03/25/2013    Procedure: INSERTION OF MESH;  Surgeon: Lauren Sportsman, MD;  Location: [facility];  Service: General;  Laterality: N/A;  . Laparoscopic lysis of adhesions N/A 03/25/2013    Procedure: LAPAROSCOPIC LYSIS OF ADHESIONS;  Surgeon: Lauren Sportsman, MD;  Location: [facility];  Service: General;  Laterality: N/A;  . Omentectomy N/A 03/25/2013    Procedure: PARTIAL OMENTECTOMY;  Surgeon: Lauren Sportsman, MD;  Location: [facility];  Service: General;  Laterality: N/A;    History   Social History  . Marital Status: Widowed    Spouse Name: N/A    Number of Children: N/A  . Years of Education: N/A   Occupational History  . Not on file.   Social History Main Topics  . Smoking status: Never Smoker   . Smokeless tobacco: Never Used  . Alcohol Use: No  . Drug Use: No  . Sexual Activity: No   Other Topics Concern  . Not on file   Social History Narrative  . No narrative on file    Family History  Problem Relation Age of Onset  . Heart disease Father     Current Outpatient Prescriptions  Medication Sig Dispense Refill  . ALPRAZolam (XANAX) 0.25 MG tablet Take 1  tablet (0.25 mg total) by mouth 2 (two) times daily as needed for anxiety.  30 tablet  0  . amiodarone (PACERONE) 200 MG tablet Take 1 tablet (200 mg total) by mouth daily.  30 tablet  1  . aspirin 81 MG chewable tablet Chew 1 tablet (81 mg total) by mouth daily.      Marland Kitchen atorvastatin (LIPITOR) 20 MG tablet Take 1 tablet (20 mg total) by mouth daily with supper.  30 tablet  1  . buPROPion (WELLBUTRIN XL) 150 MG 24 hr tablet Take 1 tablet (150 mg total) by mouth every morning.  30 tablet  11  . carvedilol (COREG) 6.25 MG tablet Take 1 tablet (6.25 mg total) by mouth 2 (two) times daily with a meal.  60 tablet  1  . co-enzyme Q-10 50 MG capsule Take 1 capsule (50 mg total) by mouth at bedtime.  30 capsule  1  . cyanocobalamin (,VITAMIN B-12,) 1000 MCG/ML injection Inject 1,000 mcg into the muscle every 30 (  thirty) days.      . feeding supplement (ENSURE COMPLETE) LIQD Take 237 mLs by mouth daily.  30 Bottle  0  . ferrous sulfate 325 (65 FE) MG tablet Take 1 tablet (325 mg total) by mouth 3 (three) times daily with meals.  90 tablet  1  . fish oil-omega-3 fatty acids 1000 MG capsule Take 1 capsule (1 g total) by mouth daily.  30 capsule  1  . furosemide (LASIX) 20 MG tablet Take 1 tablet (20 mg total) by mouth daily as needed (take if weight goes up by 2 lbs overnight or 5 lbs in a week.).  30 tablet  1  . levothyroxine (SYNTHROID, LEVOTHROID) 125 MCG tablet Take 1 tablet (125 mcg total) by mouth daily.  30 tablet  11  . loperamide (IMODIUM) 2 MG capsule Take 1 capsule (2 mg total) by mouth 2 (two) times daily.  90 capsule  0  . Multiple Vitamin (MULTIVITAMIN WITH MINERALS) TABS tablet Take 1 tablet by mouth daily.      . mycophenolate (CELLCEPT) 500 MG tablet Take 2 tablets (1,000 mg total) by mouth 2 (two) times daily.  60 tablet  1  . potassium chloride (K-DUR) 10 MEQ tablet Take 2 tablets (20 mEq total) by mouth daily.      . Probiotic Product (PROBIOTIC DAILY PO) Take 1 tablet by mouth daily.        . psyllium (HYDROCIL/METAMUCIL) 95 % PACK Take 1 packet by mouth 2 (two) times daily.  56 each    . ranitidine (ZANTAC) 150 MG tablet Take 150 mg by mouth daily.      . vitamin C (ASCORBIC ACID) 250 MG tablet Take 250 mg by mouth daily.       No current facility-administered medications for this visit.     Allergies  Allergen Reactions  . Zofran [Ondansetron Hcl]     hypotension  . Furosemide Rash    Patient has tolerated Lasix without allergic reaction, 04/05/13.  No rash after multiple administrations.  Ardine Bjork [Iodinated Diagnostic Agents] Rash  . Sulfamethoxazole Rash    REACTION: unspecified    BP 130/60  Pulse 64  Temp(Src) 98.4 F (36.9 C) (Temporal)  Resp 14  Ht 5\' 3"  (1.6 m)  Wt 168 lb 12.8 oz (76.567 kg)  BMI 29.91 kg/m2  Ct Abdomen Pelvis Wo Contrast  03/22/2013   CLINICAL DATA:  Mid abdominal pain, nausea, vomiting, history hyperlipidemia, hypertension, CHF, myasthenia gravis, ulcerative colitis, DVT  EXAM: CT ABDOMEN AND PELVIS WITHOUT CONTRAST  TECHNIQUE: Multidetector CT imaging of the abdomen and pelvis was performed following the standard protocol without intravenous contrast. Sagittal and coronal MPR images reconstructed from axial data set. Patient drank dilute oral contrast for exam  COMPARISON:  04/07/2009  FINDINGS: Dense mitral annular calcification.  Chronic interstitial changes/fibrosis at lung bases.  Small pericardial effusion.  Small cyst at inferior pole left kidney 3.3 x 3.5 cm image 38.  Within limits of a nonenhanced exam, no other focal abnormalities of the liver, spleen, pancreas, kidneys, or adrenal glands.  Porcelain gallbladder, difficult to exclude a coexistent large calcified gallstone internally.  IVC filter.  Scattered atherosclerotic calcifications.  Umbilical hernia containing fat with an additional infraumbilical hernia containing fat and a small bowel loop with associated mesenteric edema.  Mild dilatation of proximal small bowel loops  with decompression of distal small bowel loops likely representing a degree of a small bowel obstruction at the hernia.  Colon shows mild diffuse questionable  bowel wall thickening which may be related to a history of ulcerative colitis.  No pericolic inflammatory changes.  Stomach unremarkable.  Normal appearing bladder and ureters.  Uterus surgically absent with nonvisualization of ovaries.  Question normal appendix.  No mass, adenopathy, or acute bone lesion.  Diffuse osseous demineralization identified with evidence of L4-L5 fusion  IMPRESSION: Infraumbilical hernia containing a small bowel loop with likely a component of small bowel obstruction and mild bowel wall edema at the site.  Porcelain gallbladder, cannot exclude coexistent internal gallstone.  Small pericardial effusion.  Tiny umbilical hernia containing fat.  Mild diffuse thickening of the colon is questioned, which may be related to history of ulcerative colitis.   Electronically Signed   By: Ulyses Southward M.D.   On: 03/22/2013 18:17   Dg Chest 2 View  03/22/2013   CLINICAL DATA:  Nausea and vomiting  EXAM: CHEST  2 VIEW  COMPARISON:  None.  FINDINGS: The cardiac shadow is within normal limits. The lungs are well aerated bilaterally. No focal infiltrate or effusion is seen. A right chest wall port is noted. No acute bony abnormality is noted.  IMPRESSION: No acute abnormality seen.   Electronically Signed   By: Alcide Clever M.D.   On: 03/22/2013 16:31   Dg Cholangiogram Operative  03/25/2013   CLINICAL DATA:  Cholecystectomy for porcelain gallbladder.  EXAM: INTRAOPERATIVE CHOLANGIOGRAM  TECHNIQUE: Cholangiographic images from the C-arm fluoroscopic device were submitted for interpretation post-operatively. Please see the procedural report for the amount of contrast and the fluoroscopy time utilized.  COMPARISON:  None.  FINDINGS: Intraoperative imaging with a C-arm demonstrates some initial extravasation of injected contrast at the level of the  cystic duct. The biliary tree shows no evidence of filling defect or obstruction.  IMPRESSION: No filling defects identified by intraoperative cholangiography.   Electronically Signed   By: Irish Lack M.D.   On: 03/25/2013 16:53   Dg Chest Port 1 View  03/25/2013   CLINICAL DATA:  Small bowel obstruction  EXAM: PORTABLE CHEST - 1 VIEW  COMPARISON:  03/22/2013  FINDINGS: Stable right IJ Port-A-Cath position. NG tube in place. Borderline cardiomegaly. Mild left basilar atelectasis. No acute infiltrate or pulmonary edema.  IMPRESSION: NG tube in place. No active disease. Borderline cardiomegaly. Mild left basilar atelectasis.   Electronically Signed   By: Natasha Mead M.D.   On: 03/25/2013 08:59   Dg Abd Portable 1v  03/27/2013   CLINICAL DATA:  Diarrhea, abdominal pain, recent cholecystectomy  EXAM: PORTABLE ABDOMEN - 1 VIEW  COMPARISON:  03/25/2013  FINDINGS: Scattered air and stool throughout the bowel. Negative for obstruction or ileus. Degenerative changes of the spine with mild scoliosis. IVC filter noted at the L3 level. Atherosclerosis of the aorta. Bones are osteopenic. Degenerative changes of the pelvis and hips.  IMPRESSION: Nonspecific mild gaseous distention of the bowel but no obstruction or ileus.   Electronically Signed   By: Ruel Favors M.D.   On: 03/27/2013 20:30   Dg Abd Portable 2v  03/25/2013   CLINICAL DATA:  Followup small bowel obstruction  EXAM: PORTABLE ABDOMEN - 2 VIEW  COMPARISON:  02/10/2013  FINDINGS: Nasogastric tube tip is in the stomach. There is a IVC filter in place. Porcelain gallbladder is identified within the right upper quadrant of the abdomen. The bowel gas pattern is normal. There is no evidence of free air.  IMPRESSION: 1.  Nonobstructive bowel gas pattern.  2. Porcelain gallbladder  3. IVC filter.   Electronically  Signed   By: Signa Kell M.D.   On: 03/25/2013 09:03     Review of Systems  Constitutional: Positive for activity change and unexpected weight  change. Negative for fever, chills and diaphoresis.  HENT: Negative for ear pain, sore throat and trouble swallowing.   Eyes: Negative for photophobia and visual disturbance.  Respiratory: Negative for cough and choking.   Cardiovascular: Negative for chest pain and palpitations.  Gastrointestinal: Negative for nausea, vomiting, abdominal pain, diarrhea, constipation, blood in stool, abdominal distention, anal bleeding and rectal pain.  Genitourinary: Negative for dysuria, frequency and difficulty urinating.  Musculoskeletal: Negative for gait problem and myalgias.  Skin: Negative for color change, pallor and rash.  Neurological: Positive for weakness. Negative for dizziness, syncope, speech difficulty and numbness.  Hematological: Negative for adenopathy.  Psychiatric/Behavioral: Negative for confusion and agitation. The patient is not nervous/anxious.        Objective:   Physical Exam  Constitutional: She is oriented to person, place, and time. She appears well-developed and well-nourished. No distress.  HENT:  Head: Normocephalic.  Mouth/Throat: Oropharynx is clear and moist. No oropharyngeal exudate.  Eyes: Conjunctivae and EOM are normal. Pupils are equal, round, and reactive to light. No scleral icterus.  Neck: Normal range of motion. No tracheal deviation present.  Cardiovascular: Normal rate and intact distal pulses.   Pulmonary/Chest: Effort normal. No respiratory distress. She exhibits no tenderness.  Abdominal: Soft. She exhibits no distension. There is no tenderness. There is no rebound and no guarding. Hernia confirmed negative in the right inguinal area and confirmed negative in the left inguinal area.  Incisions clean with normal healing ridges.  No hernias  Genitourinary: No vaginal discharge found.  Musculoskeletal: Normal range of motion. She exhibits no tenderness.  Lymphadenopathy:       Right: No inguinal adenopathy present.       Left: No inguinal adenopathy  present.  Neurological: She is alert and oriented to person, place, and time. No cranial nerve deficit. She exhibits normal muscle tone. Coordination normal.  Skin: Skin is warm and dry. No rash noted. She is not diaphoretic. No pallor.  Psychiatric: She has a normal mood and affect. Her behavior is normal. Judgment and thought content normal.       Assessment:     Gradually recovering from urgent repair of incarcerated abdominal wall hernia causing bowel obstruction and removal of porcelain gallbladder.     Plan:     I think considering her comorbidities and urgent surgery, she has recovered rather well.  I again went over the indications for surgery and her hospital course.  She is still deconditioned, but is making gradual progress.  Her quality-of-life has improved.  She is in good spirits.  She and her daughter are grateful for the care she received.  I am glad she is in a better place.  I hope she continues to improve.  I agree with close followup with her other physicians, especially in managing the edema and heart failure with diuretics.  Followup with primary care physician for other health issues as would normally be done.   Increase activity as tolerated to regular activity.  Low impact exercise such as walking an hour a day at least ideal.  Marks not push through pain.  Diet as tolerated.  Low fat high fiber diet ideal.  Bowel regimen with 30 g fiber a day and fiber supplement as needed to avoid problems.  Return to clinic as needed.   Instructions discussed.  Questions answered.  The patient expressed understanding and appreciation

## 2013-04-17 NOTE — Patient Instructions (Signed)
HERNIA REPAIR: POST OP INSTRUCTIONS  1. DIET: Follow a light bland diet the first 24 hours after arrival home, such as soup, liquids, crackers, etc.  Be sure to include lots of fluids daily.  Avoid fast food or heavy meals as your are more likely to get nauseated.  Eat a low fat the next few days after surgery. 2. Take your usually prescribed home medications unless otherwise directed. 3. PAIN CONTROL: a. Pain is best controlled by a usual combination of three different methods TOGETHER: i. Ice/Heat ii. Over the counter pain medication iii. Prescription pain medication b. Most patients will experience some swelling and bruising around the hernia(s) such as the bellybutton, groins, or old incisions.  Ice packs or heating pads (30-60 minutes up to 6 times a day) will help. Use ice for the first few days to help decrease swelling and bruising, then switch to heat to help relax tight/sore spots and speed recovery.  Some people prefer to use ice alone, heat alone, alternating between ice & heat.  Experiment to what works for you.  Swelling and bruising can take several weeks to resolve.   c. It is helpful to take an over-the-counter pain medication regularly for the first few weeks.  Choose one of the following that works best for you: i. Naproxen (Aleve, etc)  Two 220mg tabs twice a day ii. Ibuprofen (Advil, etc) Three 200mg tabs four times a day (every meal & bedtime) iii. Acetaminophen (Tylenol, etc) 325-650mg four times a day (every meal & bedtime) d. A  prescription for pain medication should be given to you upon discharge.  Take your pain medication as prescribed.  i. If you are having problems/concerns with the prescription medicine (does not control pain, nausea, vomiting, rash, itching, etc), please call us [phone] to see if we need to switch you to a different pain medicine that will work better for you and/or control your side effect better. ii. If you need a refill on your pain  medication, please contact your pharmacy.  They will contact our office to request authorization. Prescriptions will not be filled after 5 pm or on week-ends. 4. Avoid getting constipated.  Between the surgery and the pain medications, it is common to experience some constipation.  Increasing fluid intake and taking a fiber supplement (such as Metamucil, Citrucel, FiberCon, MiraLax, etc) 1-2 times a day regularly will usually help prevent this problem from occurring.  A mild laxative (prune juice, Milk of Magnesia, MiraLax, etc) should be taken according to package directions if there are no bowel movements after 48 hours.   5. Wash / shower every day.  You may shower over the dressings as they are waterproof.   6. Remove your waterproof bandages 5 days after surgery.  You may leave the incision open to air.  You may replace a dressing/Band-Aid to cover the incision for comfort if you wish.  Continue to shower over incision(s) after the dressing is off.    7. ACTIVITIES as tolerated:   a. You may resume regular (light) daily activities beginning the next day-such as daily self-care, walking, climbing stairs-gradually increasing activities as tolerated.  If you can walk 30 minutes without difficulty, it is safe to try more intense activity such as jogging, treadmill, bicycling, low-impact aerobics, swimming, etc. b. Save the most intensive and strenuous activity for last such as sit-ups, heavy lifting, contact sports, etc  Refrain from any heavy lifting or straining until you are off narcotics for pain control.     c. DO NOT PUSH THROUGH PAIN.  Let pain be your guide: If it hurts to do something, don't do it.  Pain is your body warning you to avoid that activity for another week until the pain goes down. d. You may drive when you are no longer taking prescription pain medication, you can comfortably wear a seatbelt, and you can safely maneuver your car and apply brakes. e. Bonita Quin may have sexual intercourse  when it is comfortable.  8. FOLLOW UP in our office a. Please call [facility] at [phone] to set up an appointment to see your surgeon in the office for a follow-up appointment approximately 2-3 weeks after your surgery. b. Make sure that you call for this appointment the day you arrive home to insure a convenient appointment time. 9.  IF YOU HAVE DISABILITY OR FAMILY LEAVE FORMS, BRING THEM TO THE OFFICE FOR PROCESSING.  DO NOT GIVE THEM TO YOUR DOCTOR.  WHEN TO CALL us [phone]: 1. Poor pain control 2. Reactions / problems with new medications (rash/itching, nausea, etc)  3. Fever over 101.5 F (38.5 C) 4. Inability to urinate 5. Nausea and/or vomiting 6. Worsening swelling or bruising 7. Continued bleeding from incision. 8. Increased pain, redness, or drainage from the incision   The clinic staff is available to answer your questions during regular business hours (8:30am-5pm).  Please don't hesitate to call and ask to speak to one of our nurses for clinical concerns.   If you have a medical emergency, go to the nearest emergency room or call 911.  A surgeon from [facility] is always on call at the hospitals in [facility], [place] [address], [address], [place], [place]  [address] ?  [address] [address], [place], [place]   [address] MAIN: [phone] ? TOLL FREE: [phone] ? FAX: [phone] [email or web address]  Exercise to Stay Healthy Exercise helps you become and stay healthy. EXERCISE IDEAS AND TIPS Choose exercises that:  You enjoy.  Fit into your day. You do not need to exercise really hard to be healthy. You can do exercises at a slow or medium level and stay healthy. You can:  Stretch before and after working out.  Try yoga, Pilates, or tai chi.  Lift weights.  Walk fast, swim, jog, run, climb stairs, bicycle, dance, or rollerskate.  Take aerobic classes. Exercises that burn about 150 calories:  Running  1  miles in 15 minutes.  Playing volleyball for 45 to 60 minutes.  Washing and waxing a car for 45 to 60 minutes.  Playing touch football for 45 minutes.  Walking 1  miles in 35 minutes.  Pushing a stroller 1  miles in 30 minutes.  Playing basketball for 30 minutes.  Raking leaves for 30 minutes.  Bicycling 5 miles in 30 minutes.  Walking 2 miles in 30 minutes.  Dancing for 30 minutes.  Shoveling snow for 15 minutes.  Swimming laps for 20 minutes.  Walking up stairs for 15 minutes.  Bicycling 4 miles in 15 minutes.  Gardening for 30 to 45 minutes.  Jumping rope for 15 minutes.  Washing windows or floors for 45 to 60 minutes. Document Released: 06/11/2010 Document Revised: 08/01/2011 Document Reviewed: 06/11/2010 [facility] Patient Information 2014 [place], [place].  Edema Edema is an abnormal build-up of fluids in tissues. Because this is partly dependent on gravity (water flows to the lowest place), it is more common in the legs and thighs (lower extremities). It is also common  in the looser tissues, like around the eyes. Painless swelling of the feet and ankles is common and increases as a person ages. It may affect both legs and may include the calves or even thighs. When squeezed, the fluid may move out of the affected area and may leave a dent for a few moments. CAUSES   Prolonged standing or sitting in one place for extended periods of time. Movement helps pump tissue fluid into the veins, and absence of movement prevents this, resulting in edema.  Varicose veins. The valves in the veins do not work as well as they should. This causes fluid to leak into the tissues.  Fluid and salt overload.  Injury, burn, or surgery to the leg, ankle, or foot, may damage veins and allow fluid to leak out.  Sunburn damages vessels. Leaky vessels allow fluid to go out into the sunburned tissues.  Allergies (from insect bites or stings, medications or chemicals) cause swelling  by allowing vessels to become leaky.  Protein in the blood helps keep fluid in your vessels. Low protein, as in malnutrition, allows fluid to leak out.  Hormonal changes, including pregnancy and menstruation, cause fluid retention. This fluid may leak out of vessels and cause edema.  Medications that cause fluid retention. Examples are sex hormones, blood pressure medications, steroid treatment, or anti-depressants.  Some illnesses cause edema, especially heart failure, kidney disease, or liver disease.  Surgery that cuts veins or lymph nodes, such as surgery done for the heart or for breast cancer, may result in edema. DIAGNOSIS  Your caregiver is usually easily able to determine what is causing your swelling (edema) by simply asking what is wrong (getting a history) and examining you (doing a physical). Sometimes x-rays, EKG (electrocardiogram or heart tracing), and blood work may be done to evaluate for underlying medical illness. TREATMENT  General treatment includes:  Leg elevation (or elevation of the affected body part).  Restriction of fluid intake.  Prevention of fluid overload.  Compression of the affected body part. Compression with elastic bandages or support stockings squeezes the tissues, preventing fluid from entering and forcing it back into the blood vessels.  Diuretics (also called water pills or fluid pills) pull fluid out of your body in the form of increased urination. These are effective in reducing the swelling, but can have side effects and must be used only under your caregiver's supervision. Diuretics are appropriate only for some types of edema. The specific treatment can be directed at any underlying causes discovered. Heart, liver, or kidney disease should be treated appropriately. HOME CARE INSTRUCTIONS   Elevate the legs (or affected body part) above the level of the heart, while lying down.  Avoid sitting or standing still for prolonged periods of  time.  Avoid putting anything directly under the knees when lying down, and do not wear constricting clothing or garters on the upper legs.  Exercising the legs causes the fluid to work back into the veins and lymphatic channels. This may help the swelling go down.  The pressure applied by elastic bandages or support stockings can help reduce ankle swelling.  A low-salt diet may help reduce fluid retention and decrease the ankle swelling.  Take any medications exactly as prescribed. SEEK MEDICAL CARE IF:  Your edema is not responding to recommended treatments. SEEK IMMEDIATE MEDICAL CARE IF:   You develop shortness of breath or chest pain.  You cannot breathe when you lay down; or if, while lying down, you have to get  up and go to the window to get your breath.  You are having increasing swelling without relief from treatment.  You develop a fever over 102 F (38.9 C).  You develop pain or redness in the areas that are swollen.  Tell your caregiver right away if you have gained 03 lb/1.4 kg in 1 day or 05 lb/2.3 kg in a week. MAKE SURE YOU:   Understand these instructions.  Will watch your condition.  Will get help right away if you are not doing well or get worse. Document Released: 05/09/2005 Document Revised: 11/08/2011 Document Reviewed: 12/26/2007 [facility] Patient Information 2014 [place], [place].

## 2013-04-22 ENCOUNTER — Encounter: Payer: Self-pay | Admitting: Physician Assistant

## 2013-04-22 ENCOUNTER — Ambulatory Visit (INDEPENDENT_AMBULATORY_CARE_PROVIDER_SITE_OTHER): Payer: Medicare Other | Admitting: Physician Assistant

## 2013-04-22 ENCOUNTER — Encounter: Payer: Self-pay | Admitting: Cardiology

## 2013-04-22 VITALS — BP 137/71 | HR 61 | Ht 63.0 in | Wt 172.0 lb

## 2013-04-22 DIAGNOSIS — I428 Other cardiomyopathies: Secondary | ICD-10-CM

## 2013-04-22 DIAGNOSIS — I429 Cardiomyopathy, unspecified: Secondary | ICD-10-CM

## 2013-04-22 DIAGNOSIS — I5022 Chronic systolic (congestive) heart failure: Secondary | ICD-10-CM

## 2013-04-22 DIAGNOSIS — I4891 Unspecified atrial fibrillation: Secondary | ICD-10-CM

## 2013-04-22 DIAGNOSIS — I214 Non-ST elevation (NSTEMI) myocardial infarction: Secondary | ICD-10-CM

## 2013-04-22 NOTE — Progress Notes (Signed)
[address] [place], [place]  [address] Phone: [phone] Fax:  [phone]  Date:  04/22/2013   ID:  [name], DOB 1924/10/09, MRN [id number]  PCP:  Nelwyn Salisbury, MD  Cardiologist:  Dr. Verne Carrow     History of Present Illness: [name] is a 79 y.o. female with a history of atrial fibrillation, diastolic CHF, nonobstructive CAD by prior cardiac catheterization, HTN, HL, myasthenia gravis, ulcerative colitis and CMV colitis. She has been hospitalized several times over the last several months. She suffered a DVT in her left leg in 12/2012. She developed significant rectal bleeding on Coumadin and had an IVC filter placed. Her hemoglobin was down to 7 and she was transfused with packed red blood cells. She was deemed a poor candidate for anticoagulation at that time.    She returns to the office today after a prolonged hospitalization last month for an incarcerated hernia complicated by small bowel obstruction, atrial fibrillation with RVR, acute renal failure and non-STEMI. She ultimately underwent laparoscopic ventral hernia repair and removal of a porcelain gallbladder. She was seen by cardiology and followed quite closely. She converted to sinus rhythm on IV amiodarone. Her non-STEMI was felt to likely be a type II non-STEMI secondary to demand ischemia in the setting of atrial fibrillation with RVR and acute illness.  Echocardiogram (03/23/13): Mod LVH, EF 30-35% with AK of the entire apical myocardium, AS/AL/Inf HK, Gr 1 DD, aortic sclerosis, MAC, mild MR, atrial septal lipomatous hypertrophy, mod TR, PASP 37. 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 Her atrial fibrillation was also felt to be related to her acute illness. Anticoagulation was not recommended given this. She was kept on aspirin only. She did have some dizziness and hypotension. She was initially not placed on ACE  inhibitor. This was started prior to her discharge to inpatient rehabilitation. However, in rehabilitation, lisinopril was discontinued again for hypotension. She did require diuresis for volume overload.  Invasive evaluation for non-STEMI was deferred given her age and multiple comorbid illnesses. It was recommended that she remain on amiodarone for 2-3 months. It was also recommended that she have a follow up echocardiogram 6 weeks post discharge to reassess her LV function.  She is here with her daughter today. She is back at home and is followed by home health nurse and home health physical therapist. She denies any chest pain or shortness of breath.  Her activity is limited. She denies orthopnea, PND. She denies syncope.  She has chronic LE edema that is stable. Her weights have been fairly stable since discharge.  Recent Labs: 12/08/2012: TSH 0.640  03/27/2013: Pro B Natriuretic peptide (BNP) 5592.0*  04/10/2013: ALT 6; Creatinine 0.96; Hemoglobin 9.4*; Potassium 4.4   Wt Readings from Last 3 Encounters:  04/22/13 172 lb (78.019 kg)  04/17/13 168 lb 12.8 oz (76.567 kg)  04/11/13 174 lb 9.7 oz (79.2 kg)     Past Medical History  Diagnosis Date  . Anemia   . Hyperlipemia   . Hypertension   . Thyroid disease     hypothyroidism  . Anxiety   . Vitamin B 12 deficiency   . TIA (transient ischemic attack)     4-09  . Microscopic hematuria     benign microscopic hematuria, worked up b Dr Alexis Frock  . Gall stone     porcelain gall bladder with single stone  per CT 02-2009 Dr Birdie Sons  .  Osteoporosis     DEXA on 04-30-10  . Myasthenia gravis     sees Dr. Terrace Arabia   . Ulcerative colitis   . DVT of leg (deep venous thrombosis) 12/2012    left leg; coumadin d/c b/c of LGI bleed; s/p IVC filter  . CMV colitis   . Cancer     uterine/ovarian  . Chronic systolic CHF (congestive heart failure)   . Cardiomyopathy     a. 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 Paroxysmal atrial fibrillation     not on coumadin due to AF in setting of acute illness as well as prior GI bleed on coumadin for DVT  . NSTEMI (non-ST elevated myocardial infarction)     a. 03/2013 in setting of incarcerated ventral hernia, SBO, ARF => likely Type 2 NSTEMI    Current Outpatient Prescriptions  Medication Sig Dispense Refill  . ALPRAZolam (XANAX) 0.25 MG tablet Take 1 tablet (0.25 mg total) by mouth 2 (two) times daily as needed for anxiety.  30 tablet  0  . amiodarone (PACERONE) 200 MG tablet Take 1 tablet (200 mg total) by mouth daily.  30 tablet  1  . aspirin 81 MG chewable tablet Chew 1 tablet (81 mg total) by mouth daily.      Marland Kitchen atorvastatin (LIPITOR) 20 MG tablet Take 1 tablet (20 mg total) by mouth daily with supper.  30 tablet  1  . buPROPion (WELLBUTRIN XL) 150 MG 24 hr tablet Take 1 tablet (150 mg total) by mouth every morning.  30 tablet  11  . carvedilol (COREG) 6.25 MG tablet Take 1 tablet (6.25 mg total) by mouth 2 (two) times daily with a meal.  60 tablet  1  . co-enzyme Q-10 50 MG capsule Take 1 capsule (50 mg total) by mouth at bedtime.  30 capsule  1  . cyanocobalamin (,VITAMIN B-12,) 1000 MCG/ML injection Inject 1,000 mcg into the muscle every 30 (thirty) days.      . feeding supplement (ENSURE COMPLETE) LIQD Take 237 mLs by mouth daily.  30 Bottle  0  . ferrous sulfate 325 (65 FE) MG tablet Take 1 tablet (325 mg total) by mouth 3 (three) times daily with meals.  90 tablet  1  . fish oil-omega-3 fatty acids 1000 MG capsule Take 1 capsule (1 g total) by mouth daily.  30 capsule  1  . furosemide (LASIX) 20 MG tablet Take 1 tablet (20 mg total) by mouth daily as needed (take if weight goes up by 2 lbs overnight or 5 lbs in a  week.).  30 tablet  1  . levothyroxine (SYNTHROID, LEVOTHROID) 125 MCG tablet Take 1 tablet (125 mcg total) by mouth daily.  30 tablet  11  . loperamide (IMODIUM) 2 MG capsule Take 1 capsule (2 mg total) by mouth 2 (two) times daily.  90 capsule  0  . Multiple Vitamin (MULTIVITAMIN WITH MINERALS) TABS tablet Take 1 tablet by mouth daily.      . mycophenolate (CELLCEPT) 500 MG tablet Take 2 tablets (1,000 mg total) by mouth 2 (two) times daily.  60 tablet  1  . potassium chloride (K-DUR) 10 MEQ tablet Take 2 tablets (20 mEq total) by mouth daily.      Marland Kitchen  Probiotic Product (PROBIOTIC DAILY PO) Take 1 tablet by mouth daily.       . psyllium (HYDROCIL/METAMUCIL) 95 % PACK Take 1 packet by mouth 2 (two) times daily.  56 each    . ranitidine (ZANTAC) 150 MG tablet Take 150 mg by mouth daily.      . vitamin C (ASCORBIC ACID) 250 MG tablet Take 250 mg by mouth daily.       No current facility-administered medications for this visit.    Allergies:   Zofran; Furosemide; Ivp dye; and Sulfamethoxazole   Social History:  The patient  reports that she has never smoked. She has never used smokeless tobacco. She reports that she does not drink alcohol or use illicit drugs.   Family History:  The patient's family history includes Heart disease in her father.   ROS:  Please see the history of present illness.      All other systems reviewed and negative.   PHYSICAL EXAM: VS:  BP 137/71  Pulse 61  Ht 5\' 3"  (1.6 m)  Wt 172 lb (78.019 kg)  BMI 30.48 kg/m2 Well nourished, well developed, in no acute distress HEENT: normal Neck: no JVD at 90 Cardiac:  normal S1, S2; RRR; no murmur Lungs:  clear to auscultation bilaterally, no wheezing, rhonchi or rales Abd: soft, nontender, no hepatomegaly Ext: 1+ bilateral LE edema; bilateral compression stockings in place Skin: warm and dry Neuro:  CNs 2-12 intact, no focal abnormalities noted  EKG:  NSR, HR 59, LBBB     ASSESSMENT AND PLAN:  1. Non-STEMI: This  was likely a type II non-STEMI secondary to demand ischemia in the setting of atrial fibrillation with RVR and acute illness. Invasive evaluation has been deferred given her advanced age and comorbid illnesses. Continue aspirin, statin and beta blocker. 2. Cardiomyopathy: This is likely a stress-induced cardiomyopathy given apical wall motion abnormality. I will arrange follow up echocardiogram later this month. Continue beta blocker. She has been unable to tolerate ACE inhibitor due to low blood pressures. Her blood pressures at home have run much lower than what we obtained in the office today. At this point, I will not start her on an ACE inhibitor. If her ejection fraction does not recover, we may need to reconsider the initiation of an ACE inhibitor. 3. Chronic Systolic CHF: Volume stable. Continue current dose of furosemide and potassium. Obtain basic metabolic panel today. 4. Atrial Fibrillation: She is maintaining NSR. She will likely be taken off of amiodarone in the next 2-3 months. Continue aspirin. CHADS2-VASc=7.  However, given her history of significant lower GI bleeding in the setting of Coumadin use in the past, I do not believe that she is an adequate candidate for anticoagulation.   5. Ventral Hernia: Continue follow up with surgery as planned. 6. CMV Colitis:  Continue follow up with gastroenterology and infectious disease as planned. 7. Myasthenia Gravis: Continue follow up with neurology as planned. 8. Disposition: Follow up with Dr. Verne Carrow in 4-5 weeks.  Signed, Tereso Newcomer, PA-C  04/22/2013 2:03 PM

## 2013-04-22 NOTE — Patient Instructions (Addendum)
Your physician has requested that you have an echocardiogram the week of 05/10/13. Echocardiography is a painless test that uses sound waves to create images of your heart. It provides your doctor with information about the size and shape of your heart and how well your heart's chambers and valves are working. This procedure takes approximately one hour. There are no restrictions for this procedure.  Your physician recommends that you return for lab work today for Bmet.   Your physician recommends that you schedule a follow-up appointment in: 4-5 weeks with Dr. Clifton James.

## 2013-04-23 LAB — BASIC METABOLIC PANEL
BUN: 15 mg/dL (ref 6–23)
CO2: 25 mEq/L (ref 19–32)
Chloride: 106 mEq/L (ref 96–112)
Creatinine, Ser: 1 mg/dL (ref 0.4–1.2)
GFR: 56.41 mL/min — ABNORMAL LOW (ref >60.00)
Glucose, Bld: 85 mg/dL (ref 70–99)
Potassium: 4.5 mEq/L (ref 3.5–5.1)

## 2013-04-30 ENCOUNTER — Encounter: Payer: Self-pay | Admitting: Internal Medicine

## 2013-04-30 ENCOUNTER — Ambulatory Visit (INDEPENDENT_AMBULATORY_CARE_PROVIDER_SITE_OTHER): Payer: Medicare Other | Admitting: Internal Medicine

## 2013-04-30 VITALS — BP 123/78 | HR 65 | Temp 97.7°F | Ht 63.0 in | Wt 166.5 lb

## 2013-04-30 DIAGNOSIS — B259 Cytomegaloviral disease, unspecified: Secondary | ICD-10-CM

## 2013-04-30 DIAGNOSIS — A0839 Other viral enteritis: Secondary | ICD-10-CM

## 2013-04-30 DIAGNOSIS — A09 Infectious gastroenteritis and colitis, unspecified: Secondary | ICD-10-CM

## 2013-04-30 NOTE — Progress Notes (Signed)
Patient ID: [name], female   DOB: Dec 01, 1924, 79 y.o.   MRN: [id number]         [name]  Patient Active Problem List   Diagnosis Date Noted  . Cardiomyopathy 04/22/2013  . Physical deconditioning 04/02/2013  . Pedal edema 04/01/2013  . Chronic systolic heart failure 03/27/2013  . Pulmonary hypertension 03/27/2013  . Hypokalemia 03/27/2013  . SBO (small bowel obstruction) 03/25/2013  . Ulcerative colitis   . Protein-calorie malnutrition, severe 03/24/2013  . Anemia of chronic disease 03/23/2013  . Acute respiratory failure with hypoxia 03/23/2013  . Atrial fibrillation with RVR 03/22/2013  . N&V (nausea and vomiting) 03/22/2013  . 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 Incisional hernia, incarcerated s/p lap repair 03/25/2013 03/22/2013  . NSTEMI (non-ST elevated myocardial infarction) 03/22/2013  . Acute renal failure 12/07/2012  . ANXIETY 12/26/2007  . HYPOTHYROIDISM 11/10/2006    Patient's Medications  New Prescriptions   No medications on file  Previous Medications   ALPRAZOLAM (XANAX) 0.25 MG TABLET    Take 1 tablet (0.25 mg total) by mouth 2 (two) times daily as needed for anxiety.   AMIODARONE (PACERONE) 200 MG TABLET    Take 1 tablet (200 mg total) by mouth daily.   ASPIRIN 81 MG CHEWABLE TABLET    Chew 1 tablet (81 mg total) by mouth daily.   ATORVASTATIN (LIPITOR) 20 MG TABLET    Take 1 tablet (20 mg total) by mouth daily with supper.   BUPROPION (WELLBUTRIN XL) 150 MG 24 HR TABLET    Take 1 tablet (150 mg total) by mouth every morning.   CARVEDILOL (COREG) 6.25 MG TABLET    Take 1 tablet (6.25 mg total) by mouth 2 (two) times daily with a meal.   CO-ENZYME Q-10 50 MG CAPSULE    Take 1 capsule (50 mg total) by mouth at bedtime.   CYANOCOBALAMIN (,VITAMIN B-12,) 1000 MCG/ML INJECTION    Inject 1,000 mcg into the muscle every 30 (thirty) days.   FEEDING SUPPLEMENT (ENSURE COMPLETE) LIQD    Take 237 mLs by  mouth daily.   FERROUS SULFATE 325 (65 FE) MG TABLET    Take 1 tablet (325 mg total) by mouth 3 (three) times daily with meals.   FISH OIL-OMEGA-3 FATTY ACIDS 1000 MG CAPSULE    Take 1 capsule (1 g total) by mouth daily.   FUROSEMIDE (LASIX) 20 MG TABLET    Take 1 tablet (20 mg total) by mouth daily as needed (take if weight goes up by 2 lbs overnight or 5 lbs in a week.).   LEVOTHYROXINE (SYNTHROID, LEVOTHROID) 125 MCG TABLET    Take 1 tablet (125 mcg total) by mouth daily.   LOPERAMIDE (IMODIUM) 2 MG CAPSULE    Take 1 capsule (2 mg total) by mouth 2 (two) times daily.   MULTIPLE VITAMIN (MULTIVITAMIN WITH MINERALS) TABS TABLET    Take 1 tablet by mouth daily.   MYCOPHENOLATE (CELLCEPT) 500 MG TABLET    Take 2 tablets (1,000 mg total) by mouth 2 (two) times daily.   POTASSIUM CHLORIDE (K-DUR) 10 MEQ TABLET    Take 2 tablets (20 mEq total) by mouth daily.   PROBIOTIC PRODUCT (PROBIOTIC DAILY PO)    Take 1 tablet by mouth daily.    PSYLLIUM (HYDROCIL/METAMUCIL) 95 % PACK    Take 1 packet by mouth 2 (two) times daily.   RANITIDINE (ZANTAC) 150 MG TABLET    Take 150 mg by mouth  daily.   VITAMIN C (ASCORBIC ACID) 250 MG TABLET    Take 250 mg by mouth daily.  Modified Medications   No medications on file  Discontinued Medications   No medications on file    Subjective: [name] is in for her routine followup visit. She completed 7 weeks of valganciclovir on October 31 for her CMV colitis. On that same day she was readmitted to [facility] with abdominal pain and was found to have an incarcerated ventral hernia and porcelain gallbladder. She underwent laparoscopic surgery. She remained hospitalized through November 21. She does not have any recall of seeing me previously. She is feeling better with no pain and no more diarrhea. She has not had any further GI bleeding.  Review of Systems: Pertinent items are noted in HPI.  Past Medical History  Diagnosis Date  . Anemia   . Hyperlipemia   .  Hypertension   . Thyroid disease     hypothyroidism  . Anxiety   . Vitamin B 12 deficiency   . TIA (transient ischemic attack)     4-09  . Microscopic hematuria     benign microscopic hematuria, worked up b Dr Alexis Frock  . Gall stone     porcelain gall bladder with single stone  per CT 02-2009 Dr Birdie Sons  . Osteoporosis     DEXA on 04-30-10  . Myasthenia gravis     sees Dr. Terrace Arabia   . Ulcerative colitis   . DVT of leg (deep venous thrombosis) 12/2012    left leg; coumadin d/c b/c of LGI bleed; s/p IVC filter  . CMV colitis   . Cancer     uterine/ovarian  . Chronic systolic CHF (congestive heart failure)   . Cardiomyopathy     a. 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 Paroxysmal atrial fibrillation     not on coumadin due to AF in setting of acute illness as well as prior GI bleed on coumadin for DVT  . NSTEMI (non-ST elevated myocardial infarction)     a. 03/2013 in setting of incarcerated ventral hernia, SBO, ARF => likely Type 2 NSTEMI    History  Substance Use Topics  . Smoking status: Never Smoker   . Smokeless tobacco: Never Used  . Alcohol Use: No    Family History  Problem Relation Age of Onset  . Heart disease Father     Allergies  Allergen Reactions  . Zofran [Ondansetron Hcl]     hypotension  . Furosemide Rash    Patient has tolerated Lasix without allergic reaction, 04/05/13.  No rash after multiple administrations.  Ardine Bjork [Iodinated Diagnostic Agents] Rash  . Sulfamethoxazole Rash    REACTION: unspecified    Objective: Temp: 97.7 F (36.5 C) (12/09 1013) Temp src: Oral (12/09 1013) BP: 123/78 mmHg (12/09 1013) Pulse Rate: 65 (12/09 1013)  General: She is in good  spirits Skin: No rash Lungs: Clear Cor: Regular S1 and S2 with no murmurs Abdomen: Soft and nontender   Lab Results  Component Value Date   WBC 4.1 04/10/2013   HGB 9.4* 04/10/2013   HCT 28.5* 04/10/2013   MCV 93.4 04/10/2013   PLT 169 04/10/2013     Assessment: She has no  clinical evidence of persistent CMV colitis. Her leukopenia has resolved off valganciclovir.  Plan: 1. Continue observation off of valganciclovir 2. Follow up here as needed   Cliffton Asters, MD [facility] [phone] pager   [phone] cell 04/30/2013, 10:25 AM

## 2013-05-01 ENCOUNTER — Ambulatory Visit: Payer: Medicare Other | Admitting: Family Medicine

## 2013-05-07 ENCOUNTER — Encounter: Payer: Self-pay | Admitting: Family Medicine

## 2013-05-07 ENCOUNTER — Ambulatory Visit (INDEPENDENT_AMBULATORY_CARE_PROVIDER_SITE_OTHER): Payer: Medicare Other | Admitting: Family Medicine

## 2013-05-07 VITALS — BP 98/48 | HR 59 | Temp 97.7°F | Wt 164.0 lb

## 2013-05-07 DIAGNOSIS — K430 Incisional hernia with obstruction, without gangrene: Secondary | ICD-10-CM

## 2013-05-07 DIAGNOSIS — E538 Deficiency of other specified B group vitamins: Secondary | ICD-10-CM

## 2013-05-07 DIAGNOSIS — E039 Hypothyroidism, unspecified: Secondary | ICD-10-CM

## 2013-05-07 DIAGNOSIS — E876 Hypokalemia: Secondary | ICD-10-CM

## 2013-05-07 DIAGNOSIS — D649 Anemia, unspecified: Secondary | ICD-10-CM

## 2013-05-07 DIAGNOSIS — K56609 Unspecified intestinal obstruction, unspecified as to partial versus complete obstruction: Secondary | ICD-10-CM

## 2013-05-07 DIAGNOSIS — I509 Heart failure, unspecified: Secondary | ICD-10-CM

## 2013-05-07 DIAGNOSIS — R609 Edema, unspecified: Secondary | ICD-10-CM

## 2013-05-07 DIAGNOSIS — I214 Non-ST elevation (NSTEMI) myocardial infarction: Secondary | ICD-10-CM

## 2013-05-07 DIAGNOSIS — I10 Essential (primary) hypertension: Secondary | ICD-10-CM

## 2013-05-07 DIAGNOSIS — R600 Localized edema: Secondary | ICD-10-CM

## 2013-05-07 DIAGNOSIS — N179 Acute kidney failure, unspecified: Secondary | ICD-10-CM

## 2013-05-07 LAB — CBC WITH DIFFERENTIAL/PLATELET
Basophils Relative: 0.1 % (ref 0.0–3.0)
Eosinophils Absolute: 0.1 10*3/uL (ref 0.0–0.7)
Eosinophils Relative: 2.1 % (ref 0.0–5.0)
Hemoglobin: 9.4 g/dL — ABNORMAL LOW (ref 12.0–15.0)
MCHC: 33 g/dL (ref 30.0–36.0)
MCV: 92 fl (ref 78.0–100.0)
Monocytes Relative: 7.9 % (ref 3.0–12.0)
Neutro Abs: 3.3 10*3/uL (ref 1.4–7.7)
Neutrophils Relative %: 77.9 % — ABNORMAL HIGH (ref 43.0–77.0)
Platelets: 172 10*3/uL (ref 150.0–400.0)
RBC: 3.11 Mil/uL — ABNORMAL LOW (ref 3.87–5.11)
WBC: 4.3 10*3/uL — ABNORMAL LOW (ref 4.5–10.5)

## 2013-05-07 MED ORDER — LEVOTHYROXINE SODIUM 125 MCG PO TABS: 125.0000 ug | ORAL_TABLET | Freq: Every day | ORAL | Status: AC

## 2013-05-07 MED ORDER — BUPROPION HCL ER (XL) 150 MG PO TB24: 150.0000 mg | ORAL_TABLET | ORAL | Status: AC

## 2013-05-07 MED ORDER — POTASSIUM CHLORIDE ER 10 MEQ PO TBCR: 20.0000 meq | EXTENDED_RELEASE_TABLET | Freq: Every day | ORAL | Status: DC

## 2013-05-07 MED ORDER — ALPRAZOLAM 0.25 MG PO TABS: 0.2500 mg | ORAL_TABLET | Freq: Two times a day (BID) | ORAL | Status: DC | PRN

## 2013-05-07 MED ORDER — ALPRAZOLAM 1 MG PO TABS: 1.0000 mg | ORAL_TABLET | Freq: Every evening | ORAL | Status: DC | PRN

## 2013-05-07 MED ORDER — CYANOCOBALAMIN 1000 MCG/ML IJ SOLN
1000.0000 ug | Freq: Once | INTRAMUSCULAR | Status: AC
  Administered 2013-05-07: 1000 ug via INTRAMUSCULAR

## 2013-05-07 NOTE — Addendum Note (Signed)
Addended by: Aniceto Boss A on: 05/07/2013 02:46 PM   Modules accepted: Orders

## 2013-05-07 NOTE — Progress Notes (Signed)
   Subjective:    Patient ID: [name], female    DOB: Jan 17, 1925, 79 y.o.   MRN: [id number]  HPI Here with her daughter to follow up a hospital stay from 03-22-13 to 04-02-13, and then a rehab stay until 04-11-13 per Dr. Riley Kill. She developed an acute incarcerated umbilical hernia with a SBO and this precipitated some rapid atrial fibrillation, an acute non-STEMI, and acute renal failure. The atrial fib was converted to sinus rhythm with IV amiodarone, and the hernia was repaired laparoscopically by Dr. Michaell Cowing. Her porcelain gall bladder was removed at the same time. The surgery went well and she saw Dr. Michaell Cowing recently for a post-op check, and he was very satisfied. She has been stable from a cardiologic standpoint, and they plan to keep her on amiodarone for 2-3 months. Her renal status returned to baseline. She was anemic and received 2 units of PRBC. Her ACE was stopped due to hypotension and she was kept on minimal diuretics (Lasix 20 mg daily). Her EF was 30-35% and this wil be rechecked in a few months. After gettting PT and OT in rehab, she has been getting PT at home. She is doing quite well all in all. Her appetite is slowly coming back. No chest pain or SOB. Her BMs are loose but manageable with lomotil. Her weight is stable.    Review of Systems  Constitutional: Negative.   Respiratory: Negative.   Cardiovascular: Positive for leg swelling. Negative for chest pain and palpitations.  Gastrointestinal: Negative.        Objective:   Physical Exam  Constitutional: She is oriented to person, place, and time. She appears well-developed and well-nourished.  In her wheelchair  Cardiovascular: Normal rate, regular rhythm, normal heart sounds and intact distal pulses.   Pulmonary/Chest: Effort normal and breath sounds normal.  Abdominal: Soft. Bowel sounds are normal. She exhibits no distension and no mass. There is no tenderness. There is no rebound and no guarding.  Musculoskeletal:    2+ edema in both feet and lower legs  Neurological: She is alert and oriented to person, place, and time.          Assessment & Plan:  She is recovering well from all the things above. Her renal status and potassium levels are stable. We will check a CBC today to monitor her Hgb. She is due for another ECHO next week.

## 2013-05-08 ENCOUNTER — Other Ambulatory Visit: Payer: Self-pay | Admitting: Physical Medicine & Rehabilitation

## 2013-05-13 ENCOUNTER — Encounter: Payer: Self-pay | Admitting: Physician Assistant

## 2013-05-13 ENCOUNTER — Encounter: Payer: Self-pay | Admitting: Cardiology

## 2013-05-13 ENCOUNTER — Ambulatory Visit (HOSPITAL_COMMUNITY): Payer: Medicare Other | Attending: Cardiology | Admitting: Radiology

## 2013-05-13 DIAGNOSIS — I428 Other cardiomyopathies: Secondary | ICD-10-CM | POA: Insufficient documentation

## 2013-05-13 DIAGNOSIS — I059 Rheumatic mitral valve disease, unspecified: Secondary | ICD-10-CM | POA: Insufficient documentation

## 2013-05-13 DIAGNOSIS — I214 Non-ST elevation (NSTEMI) myocardial infarction: Secondary | ICD-10-CM

## 2013-05-13 DIAGNOSIS — I10 Essential (primary) hypertension: Secondary | ICD-10-CM | POA: Insufficient documentation

## 2013-05-13 DIAGNOSIS — I4891 Unspecified atrial fibrillation: Secondary | ICD-10-CM

## 2013-05-13 DIAGNOSIS — E785 Hyperlipidemia, unspecified: Secondary | ICD-10-CM | POA: Insufficient documentation

## 2013-05-13 DIAGNOSIS — I252 Old myocardial infarction: Secondary | ICD-10-CM | POA: Insufficient documentation

## 2013-05-13 DIAGNOSIS — I2510 Atherosclerotic heart disease of native coronary artery without angina pectoris: Secondary | ICD-10-CM | POA: Insufficient documentation

## 2013-05-13 DIAGNOSIS — I509 Heart failure, unspecified: Secondary | ICD-10-CM | POA: Insufficient documentation

## 2013-05-13 NOTE — Progress Notes (Signed)
Echocardiogram performed.  

## 2013-05-20 ENCOUNTER — Other Ambulatory Visit: Payer: Self-pay | Admitting: Family Medicine

## 2013-05-20 ENCOUNTER — Telehealth: Payer: Self-pay | Admitting: Family Medicine

## 2013-05-20 NOTE — Telephone Encounter (Signed)
FYI

## 2013-05-20 NOTE — Telephone Encounter (Signed)
Patient Information:  Caller Name: [name]  Phone: [phone]  Patient: [name]  Gender: Female  DOB: 1924/10/02  Age: 79 Years  PCP: [name])  Office Follow Up:  Does the office need to follow up with this patient?: Yes  Instructions For The Office: Advised that patient should take Coreg further apart 11 and 11pm with food, and OJ with Iron.  Disposition is to discuss with MD and call back by today for further instructions.  RN Note:  Advised to have patient take Coreg with food and when she wakes at 11, since patient is up at night until 11 then take next dose at hs not 1600; Try to have OJ with Iron.  Will send triage notes for MD and callback if ov needed or further advice.  Symptoms  Reason For Call & Symptoms: Nausea x 2 wks;  Has lost 7 lbs;  Loose dry cough onset 05/16/13;  Vomiting meds at times;  Throws up about 2 x's week or more, sometimes its with meds;  Not able to take any of her vit's only her rx meds and antidiarrhea meds.  Not eating, weak. Takes Coreg when she gets up at 11 then her 2nd dose at 4 pm, Iron TID, these meds started during last hospital stay in Nov. with nausea starting shortly after;  Patient feels the worst in the afternoon and will hardly eat any dinner or anything else thru the evening;  Changed Zantac to Nexium 05/17/13;  Reviewed Health History In EMR: Yes  Reviewed Medications In EMR: Yes  Reviewed Allergies In EMR: Yes  Reviewed Surgeries / Procedures: Yes  Date of Onset of Symptoms: 05/06/2013  Guideline(s) Used:  Vomiting  Disposition Per Guideline:   Callback by PCP Today  Reason For Disposition Reached:   Vomiting a prescription medication  Advice Given:  Reassurance:  Adults with vomiting need to stay hydrated. This is the most important thing. If you don't drink and replace lost fluids, you may get dehydrated.  You can treat vomiting, even if there is mild dehydration, at home.  Here is some care  advice that should help.  Clear Liquids:  Sip water or a rehydration drink (e.g., Gatorade or Powerade).  Avoid Nonprescription Medicines:  Call if vomiting a prescription medicine.  Call Back If:  Signs of dehydration occur  You become worse.  Patient Will Follow Care Advice:  YES

## 2013-05-24 ENCOUNTER — Telehealth: Payer: Self-pay

## 2013-05-24 NOTE — Telephone Encounter (Signed)
[name] needs to get pt extension.  She has been sick and had to cancel some appointments.  Verbal given to extend therapy.  Also request patient be seen in the office.

## 2013-05-29 ENCOUNTER — Ambulatory Visit: Payer: Medicare Other | Admitting: Cardiovascular Disease

## 2013-05-31 ENCOUNTER — Ambulatory Visit (INDEPENDENT_AMBULATORY_CARE_PROVIDER_SITE_OTHER): Payer: Medicare Other | Admitting: Cardiovascular Disease

## 2013-05-31 ENCOUNTER — Encounter: Payer: Self-pay | Admitting: Cardiovascular Disease

## 2013-05-31 VITALS — BP 132/72 | HR 75 | Ht 64.0 in | Wt 156.0 lb

## 2013-05-31 DIAGNOSIS — I214 Non-ST elevation (NSTEMI) myocardial infarction: Secondary | ICD-10-CM

## 2013-05-31 DIAGNOSIS — I5181 Takotsubo syndrome: Secondary | ICD-10-CM

## 2013-05-31 DIAGNOSIS — I509 Heart failure, unspecified: Secondary | ICD-10-CM

## 2013-05-31 DIAGNOSIS — I5022 Chronic systolic (congestive) heart failure: Secondary | ICD-10-CM

## 2013-05-31 DIAGNOSIS — I4891 Unspecified atrial fibrillation: Secondary | ICD-10-CM

## 2013-05-31 LAB — BASIC METABOLIC PANEL
BUN: 17 mg/dL (ref 6–23)
CO2: 21 meq/L (ref 19–32)
Calcium: 8.5 mg/dL (ref 8.4–10.5)
Chloride: 112 mEq/L (ref 96–112)
Creatinine, Ser: 1.1 mg/dL (ref 0.4–1.2)
GFR: 48.41 mL/min — ABNORMAL LOW (ref >60.00)
Glucose, Bld: 97 mg/dL (ref 70–99)
Potassium: 4 mEq/L (ref 3.5–5.1)
SODIUM: 138 meq/L (ref 135–145)

## 2013-05-31 LAB — CBC WITH DIFFERENTIAL/PLATELET
Basophils Absolute: 0 10*3/uL (ref 0.0–0.1)
Basophils Relative: 0.1 % (ref 0.0–3.0)
EOS ABS: 0 10*3/uL (ref 0.0–0.7)
Eosinophils Relative: 0.6 % (ref 0.0–5.0)
HCT: 32.3 % — ABNORMAL LOW (ref 36.0–46.0)
Hemoglobin: 10.7 g/dL — ABNORMAL LOW (ref 12.0–15.0)
Lymphocytes Relative: 8.8 % — ABNORMAL LOW (ref 12.0–46.0)
Lymphs Abs: 0.5 10*3/uL — ABNORMAL LOW (ref 0.7–4.0)
MCHC: 33.2 g/dL (ref 30.0–36.0)
MCV: 91.5 fl (ref 78.0–100.0)
MONOS PCT: 7.9 % (ref 3.0–12.0)
Monocytes Absolute: 0.4 10*3/uL (ref 0.1–1.0)
NEUTROS PCT: 82.6 % — AB (ref 43.0–77.0)
Neutro Abs: 4.4 10*3/uL (ref 1.4–7.7)
PLATELETS: 146 10*3/uL — AB (ref 150.0–400.0)
RBC: 3.53 Mil/uL — ABNORMAL LOW (ref 3.87–5.11)
RDW: 16.7 % — ABNORMAL HIGH (ref 11.5–14.6)
WBC: 5.3 10*3/uL (ref 4.5–10.5)

## 2013-05-31 MED ORDER — CARVEDILOL 6.25 MG PO TABS: 6.2500 mg | ORAL_TABLET | Freq: Two times a day (BID) | ORAL | Status: DC

## 2013-05-31 NOTE — Patient Instructions (Signed)
Your physician wants you to follow-up in: 3 months.  You will receive a reminder letter in the mail two months in advance. If you don't receive a letter, please call our office to schedule the follow-up appointment.  Your physician has recommended you make the following change in your medication:  Stop amiodarone.

## 2013-05-31 NOTE — Progress Notes (Signed)
History of Present Illness: [name] is a 80 y.o. female with a history of atrial fibrillation, diastolic CHF, nonobstructive CAD by prior cardiac catheterization, HTN, HL, myasthenia gravis, ulcerative colitis and CMV colitis here for cardiac follow up. She has been hospitalized several times over the last several months. She suffered a DVT in her left leg in 12/2012. She developed significant rectal bleeding on Coumadin and had an IVC filter placed. Her hemoglobin was down to 7 and she was transfused with packed red blood cells. She was deemed a poor candidate for anticoagulation at that time. She had a prolonged hospitalization December 2014 due to an incarcerated hernia complicated by small bowel obstruction, atrial fibrillation with RVR, acute renal failure and NSTEMI. She ultimately underwent laparoscopic ventral hernia repair and removal of a porcelain gallbladder. She was seen by cardiology and followed closely while in the hospital. She converted to sinus rhythm on IV amiodarone. Her non-STEMI was felt to likely be a type II non-STEMI secondary to demand ischemia in the setting of atrial fibrillation with RVR and acute illness. Echocardiogram (03/23/13): Mod LVH, EF 30-35% with AK of the entire apical myocardium, AS/AL/Inf HK, Gr 1 DD, aortic sclerosis, MAC, mild MR, atrial septal lipomatous hypertrophy, mod TR, PASP 37. 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 Her atrial fibrillation was also felt to be related to her acute illness. Anticoagulation was not recommended given this. She was kept on aspirin only. She did have some dizziness and hypotension. She was initially not placed on ACE inhibitor. This was started prior to her discharge to inpatient rehabilitation. However, in rehabilitation, lisinopril was discontinued again for hypotension. She did require diuresis for volume overload. Invasive evaluation for non-STEMI  was deferred given her age and multiple comorbid illnesses. It was recommended that she remain on amiodarone for 2-3 months. Repeat echo 05/13/13 with normal LV function.   She is here today for follow up. She denies any chest pain or shortness of breath. Her activity is limited. She denies orthopnea, PND. She denies syncope. She has chronic LE edema that is stable. Overall feeling well.   Primary Care Physician: Sharlene Motts  Last Lipid Profile:Lipid Panel     Component Value Date/Time   CHOL 126 03/07/2012 1122   TRIG 275.0* 03/07/2012 1122   HDL 32.30* 03/07/2012 1122   CHOLHDL 4 03/07/2012 1122   VLDL 55.0* 03/07/2012 1122   LDLCALC 63 12/26/2007 0000     Past Medical History  Diagnosis Date  . Anemia   . Hyperlipemia   . Hypertension   . Thyroid disease     hypothyroidism  . Anxiety   . Vitamin B 12 deficiency   . TIA (transient ischemic attack)     4-09  . Microscopic hematuria     benign microscopic hematuria, worked up b Dr Katrine Coho  . Gall stone     porcelain gall bladder with single stone  per CT 02-2009 Dr Newton Pigg  . Osteoporosis     DEXA on 04-30-10  . Myasthenia gravis     sees Dr. Krista Blue   . Ulcerative colitis   . DVT of leg (deep venous thrombosis) 12/2012    left leg; coumadin d/c b/c of LGI bleed; s/p IVC filter  . CMV colitis   . Cancer     uterine/ovarian  . Chronic systolic CHF (congestive heart failure)   . Cardiomyopathy     a. 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 Paroxysmal atrial fibrillation     not on coumadin due to AF in setting of acute illness as well as prior GI bleed on coumadin for DVT  . NSTEMI  (non-ST elevated myocardial infarction)     a. 03/2013 in setting of incarcerated ventral hernia, SBO, ARF => likely Type 2 NSTEMI    Past Surgical History  Procedure Laterality Date  . Cardiac catheterization      04/1999  . Abdominal hysterectomy      with oophorectomy  . Colonoscopy  1999    normal   . Colonoscopy N/A 12/10/2012    Procedure: COLONOSCOPY;  Surgeon: Gatha Mayer, MD;  Location: [facility];  Service: Endoscopy;  Laterality: N/A;  . Flexible sigmoidoscopy N/A 01/26/2013    Procedure: FLEXIBLE SIGMOIDOSCOPY;  Surgeon: Gatha Mayer, MD;  Location: [facility];  Service: Endoscopy;  Laterality: N/A;  . Ventral hernia repair N/A 03/25/2013    Procedure: LAPAROSCOPIC VENTRAL HERNIA REPAIR ;  Surgeon: Adin Hector, MD;  Location: [facility];  Service: General;  Laterality: N/A;  . Cholecystectomy N/A 03/25/2013    Procedure: LAPAROSCOPIC CHOLECYSTECTOMY WITH INTRAOPERATIVE CHOLANGIOGRAM;  Surgeon: Adin Hector, MD;  Location: [facility];  Service: General;  Laterality: N/A;  . Insertion of mesh N/A 03/25/2013    Procedure: INSERTION OF MESH;  Surgeon: Adin Hector, MD;  Location: [facility];  Service: General;  Laterality: N/A;  . Laparoscopic lysis of adhesions N/A 03/25/2013    Procedure: LAPAROSCOPIC LYSIS OF ADHESIONS;  Surgeon: Adin Hector, MD;  Location: [facility];  Service: General;  Laterality: N/A;  . Omentectomy N/A 03/25/2013    Procedure: PARTIAL OMENTECTOMY;  Surgeon: Adin Hector, MD;  Location: [facility];  Service: General;  Laterality: N/A;    Current Outpatient Prescriptions  Medication Sig Dispense Refill  . ALPRAZolam (XANAX) 0.25 MG tablet Take 1 tablet (0.25 mg total) by mouth 2 (two) times daily as needed for anxiety.  60 tablet  5  . ALPRAZolam (XANAX) 1 MG tablet Take 1 tablet (1 mg total) by mouth at bedtime as needed for anxiety or sleep.  30 tablet  5  . amiodarone (PACERONE) 200 MG tablet Take 1 tablet (200 mg total) by mouth daily.  30 tablet  1    . aspirin 81 MG chewable tablet Chew 1 tablet (81 mg total) by mouth daily.      Marland Kitchen atorvastatin (LIPITOR) 20 MG tablet Take 1 tablet (20 mg total) by mouth daily with supper.  30 tablet  1  . buPROPion (WELLBUTRIN XL) 150 MG 24 hr tablet Take 1 tablet (150 mg total) by mouth every morning.  30 tablet  11  . carvedilol (COREG) 6.25 MG tablet Take 1 tablet (6.25 mg total) by mouth 2 (two) times daily with a meal.  60 tablet  1  . cyanocobalamin (,VITAMIN B-12,) 1000 MCG/ML injection Inject 1,000 mcg into the muscle every 30 (thirty) days.      . Esomeprazole Magnesium (NEXIUM PO) Take by mouth daily.      . feeding supplement (ENSURE COMPLETE) LIQD Take 237 mLs by mouth  daily.  30 Bottle  0  . ferrous sulfate 325 (65 FE) MG tablet Take 1 tablet (325 mg total) by mouth 3 (three) times daily with meals.  90 tablet  1  . furosemide (LASIX) 20 MG tablet Take 1 tablet (20 mg total) by mouth daily as needed (take if weight goes up by 2 lbs overnight or 5 lbs in a week.).  30 tablet  1  . levothyroxine (SYNTHROID, LEVOTHROID) 125 MCG tablet Take 1 tablet (125 mcg total) by mouth daily.  30 tablet  11  . loperamide (IMODIUM) 2 MG capsule Take 1 capsule (2 mg total) by mouth 2 (two) times daily.  90 capsule  0  . mycophenolate (CELLCEPT) 500 MG tablet Take 2 tablets (1,000 mg total) by mouth 2 (two) times daily.  60 tablet  1  . potassium chloride (K-DUR) 10 MEQ tablet Take 2 tablets (20 mEq total) by mouth daily.  60 tablet  11  . Probiotic Product (PROBIOTIC DAILY PO) Take 1 tablet by mouth daily.       . psyllium (HYDROCIL/METAMUCIL) 95 % PACK Take 1 packet by mouth 2 (two) times daily.  56 each    . vitamin C (ASCORBIC ACID) 250 MG tablet Take 250 mg by mouth daily.       No current facility-administered medications for this visit.    Allergies  Allergen Reactions  . Zofran [Ondansetron Hcl]     hypotension  . Furosemide Rash    Patient has tolerated Lasix without allergic reaction, 04/05/13.  No  rash after multiple administrations.  Clementeen Hoof [Iodinated Diagnostic Agents] Rash  . Sulfamethoxazole Rash    REACTION: unspecified    History   Social History  . Marital Status: Widowed    Spouse Name: N/A    Number of Children: N/A  . Years of Education: N/A   Occupational History  . Not on file.   Social History Main Topics  . Smoking status: Never Smoker   . Smokeless tobacco: Never Used  . Alcohol Use: No  . Drug Use: No  . Sexual Activity: No   Other Topics Concern  . Not on file   Social History Narrative  . No narrative on file    Family History  Problem Relation Age of Onset  . Heart disease Father     Review of Systems:  As stated in the HPI and otherwise negative.   BP 132/72  Pulse 75  Ht 5\' 4"  (1.626 m)  Wt 156 lb (70.761 kg)  BMI 26.76 kg/m2  Physical Examination: General: Well developed, well nourished, NAD HEENT: OP clear, mucus membranes moist SKIN: warm, dry. No rashes. Neuro: No focal deficits Musculoskeletal: Muscle strength 5/5 all ext Psychiatric: Mood and affect normal Neck: No JVD, no carotid bruits, no thyromegaly, no lymphadenopathy. Lungs:Clear bilaterally, no wheezes, rhonci, crackles Cardiovascular: Regular rate and rhythm. No murmurs, gallops or rubs. Abdomen:Soft. Bowel sounds present. Non-tender.  Extremities: 1+ bilateral lower extremity edema. Pulses are 2 + in the bilateral DP/PT.   Echo 05/13/13: Left ventricle: The cavity size was normal. Wall thickness was normal. Systolic function was normal. The estimated ejection fraction was in the range of 60% to 65%. Wall motion was normal; there were no regional wall motion abnormalities. Doppler parameters are consistent with abnormal left ventricular relaxation (grade 1 diastolic dysfunction). - Mitral valve: Calcified annulus. - Left atrium: The atrium was mildly to moderately dilated.  EKG: Sinus, rate 75 bpm. 1st degree AV block. IVCD.  Non-specific T wave  abnormalities.   Assessment and Plan:   1. Non-STEMI: This was likely a type II non-STEMI secondary to demand ischemia in the setting of atrial fibrillation with RVR and acute illness. Invasive evaluation has been deferred given her advanced age and comorbid illnesses. Continue aspirin, statin and beta blocker.   2. Cardiomyopathy: LV function is now normal. Was likely a stress-induced cardiomyopathy given apical wall motion abnormality. Continue beta blocker. She has been unable to tolerate ACE inhibitor due to low blood pressures.   3. Chronic Systolic CHF: Volume stable. Continue current dose of furosemide and potassium.   4. Atrial Fibrillation: She is maintaining NSR. Some nausea. Will stop amiodarone as this may be causing nausea. Continue aspirin. CHADS2-VASc=7. However, given her history of significant lower GI bleeding in the setting of Coumadin use in the past, will not begin chronic anti-coagulation.

## 2013-06-06 ENCOUNTER — Telehealth: Payer: Self-pay | Admitting: Family Medicine

## 2013-06-06 NOTE — Telephone Encounter (Signed)
Pt daughter  Murray Hodgkins would like sylvia to call her back tomorrow concerning some recommendation from [facility] nurse.

## 2013-06-07 ENCOUNTER — Telehealth: Payer: Self-pay | Admitting: Family Medicine

## 2013-06-07 MED ORDER — PROCHLORPERAZINE MALEATE 10 MG PO TABS
10.0000 mg | ORAL_TABLET | Freq: Three times a day (TID) | ORAL | Status: DC | PRN
Start: 2013-06-07 — End: 2013-06-07

## 2013-06-07 MED ORDER — PROCHLORPERAZINE 25 MG RE SUPP: 25.0000 mg | Freq: Two times a day (BID) | RECTAL | Status: DC | PRN

## 2013-06-07 MED ORDER — PROCHLORPERAZINE 25 MG RE SUPP: 25.0000 mg | Freq: Four times a day (QID) | RECTAL | Status: DC | PRN

## 2013-06-07 NOTE — Telephone Encounter (Signed)
Patient Information:  Caller Name: [name]  Phone: [phone]  Patient: [name]  Gender: Female  DOB: August 24, 1924  Age: 80 Years  PCP: Alysia Penna [facility])  Office Follow Up:  Does the office need to follow up with this patient?: Yes  Instructions For The Office: Please see note below regarding daughter's concerns and callback asap.   Symptoms  Reason For Call & Symptoms: Pt has had nausea x 6 weeks while on heart med for AFib.  On 05/31/13 pt was seen by cardiology for follow up of Echo/Afib, pt was doing well so took off afib medication and told that nausea should improve within 2-3 days.   06/05/13 pt is continuing to have severe nausea, no relief.   [facility] nurse recommended calling PCP for antinausea medication.  Daughter has been trying x 2 days to get in touch with Dr Sarajane Jews or his nurse for medication with no answer.   06/07/13 pt is now refusing to eat/drink anything due to the severe nausea, children are having to force nourishment so pt can take medications.  Pt is refusing to go to ER, but begging for relief.  She states she agreed to have the portacath inserted so that she could receive hydration at home without having to go to ER.   Daughter is extremely concerned about her current condition, she is afraid the pt will not make it the weekend without some relief/hydration.   Advised daughter that Dr Sarajane Jews had called in Compazine tablets.  Daughter would like to know if Dr Sarajane Jews could order IV Fluids for pt to get hydrated and possibly IV Compazine or something else IV for nausea.  [facility] nurse told daughter she would come out today if Dr Sarajane Jews would give her orders.   Please follow up with daughter with Dr Barbie Banner advice asap or contact her with further questions or if Dr  Sarajane Jews would like to talk to her.  Reviewed Health History In EMR: Yes  Reviewed Medications In EMR: Yes  Reviewed Allergies In EMR: Yes  Reviewed Surgeries / Procedures: Yes  Date of  Onset of Symptoms: 04/22/2013  Guideline(s) Used:  Vomiting  Disposition Per Guideline:   Go to ED Now (or to Office with PCP Approval)  Reason For Disposition Reached:   Drinking very little and has signs of dehydration (e.g., no urine > 12 hours, very dry mouth, very lightheaded)  Advice Given:  N/A  Patient Refused Recommendation:  Patient Requests Prescription  Please see note regarding IV fluids/meds request

## 2013-06-07 NOTE — Telephone Encounter (Signed)
Change directions for suppositories to every 12 hours, which I did.

## 2013-06-07 NOTE — Telephone Encounter (Signed)
Nurse with [facility] calling to request order for compazine.

## 2013-06-07 NOTE — Telephone Encounter (Signed)
I spoke with [name], sent new script for suppositories and I spoke with pharmacy to cancel the order for Compazine tablets.

## 2013-06-07 NOTE — Telephone Encounter (Signed)
I cannot prescribe IV fluids over the phone. If she is truly dehydrated she needs to go to the ER. We will call in Compazine but use suppositories instead of tablets so that way it gets in her system better. If she can then start taking fluids by mouth, wonderful, if not she would need to go to the ER. Call in Compazine 25 mg suppositories to use every 6 hours prn nausea, #60 with 2 rf.

## 2013-06-07 NOTE — Telephone Encounter (Signed)
Per Dr. Sarajane Jews order Compazine 10 mg take 1 po tid prn # 90 with 5 refills. I did speak with pt and Vinnie Level from [facility]. I sent script e-scribe to [facility].

## 2013-06-07 NOTE — Telephone Encounter (Signed)
I did speak with pt and sent script e-scribe for Compazine to [facility], please see previous note.

## 2013-06-11 ENCOUNTER — Encounter (HOSPITAL_COMMUNITY): Payer: Self-pay | Admitting: Emergency Medicine

## 2013-06-11 ENCOUNTER — Inpatient Hospital Stay (HOSPITAL_COMMUNITY)
Admission: EM | Admit: 2013-06-11 | Discharge: 2013-06-19 | DRG: 094 | Disposition: A | Discharge status: Rehabilitation Facility | Payer: Medicare Other | Attending: Internal Medicine | Admitting: Internal Medicine

## 2013-06-11 ENCOUNTER — Emergency Department (HOSPITAL_COMMUNITY): Payer: Medicare Other

## 2013-06-11 DIAGNOSIS — G609 Hereditary and idiopathic neuropathy, unspecified: Secondary | ICD-10-CM | POA: Diagnosis present

## 2013-06-11 DIAGNOSIS — M6281 Muscle weakness (generalized): Secondary | ICD-10-CM | POA: Diagnosis present

## 2013-06-11 DIAGNOSIS — I509 Heart failure, unspecified: Secondary | ICD-10-CM | POA: Diagnosis present

## 2013-06-11 DIAGNOSIS — R5381 Other malaise: Secondary | ICD-10-CM | POA: Diagnosis present

## 2013-06-11 DIAGNOSIS — Z800 Family history of malignant neoplasm of digestive organs: Secondary | ICD-10-CM

## 2013-06-11 DIAGNOSIS — I824Y9 Acute embolism and thrombosis of unspecified deep veins of unspecified proximal lower extremity: Secondary | ICD-10-CM | POA: Diagnosis present

## 2013-06-11 DIAGNOSIS — E039 Hypothyroidism, unspecified: Secondary | ICD-10-CM | POA: Diagnosis present

## 2013-06-11 DIAGNOSIS — L8992 Pressure ulcer of unspecified site, stage 2: Secondary | ICD-10-CM | POA: Diagnosis present

## 2013-06-11 DIAGNOSIS — R112 Nausea with vomiting, unspecified: Secondary | ICD-10-CM

## 2013-06-11 DIAGNOSIS — D696 Thrombocytopenia, unspecified: Secondary | ICD-10-CM | POA: Diagnosis present

## 2013-06-11 DIAGNOSIS — K430 Incisional hernia with obstruction, without gangrene: Secondary | ICD-10-CM

## 2013-06-11 DIAGNOSIS — N183 Chronic kidney disease, stage 3 unspecified: Secondary | ICD-10-CM | POA: Diagnosis present

## 2013-06-11 DIAGNOSIS — Z7982 Long term (current) use of aspirin: Secondary | ICD-10-CM

## 2013-06-11 DIAGNOSIS — I5022 Chronic systolic (congestive) heart failure: Secondary | ICD-10-CM | POA: Diagnosis present

## 2013-06-11 DIAGNOSIS — E785 Hyperlipidemia, unspecified: Secondary | ICD-10-CM | POA: Diagnosis present

## 2013-06-11 DIAGNOSIS — E538 Deficiency of other specified B group vitamins: Secondary | ICD-10-CM | POA: Diagnosis present

## 2013-06-11 DIAGNOSIS — R5383 Other fatigue: Secondary | ICD-10-CM

## 2013-06-11 DIAGNOSIS — R197 Diarrhea, unspecified: Secondary | ICD-10-CM | POA: Diagnosis present

## 2013-06-11 DIAGNOSIS — E8809 Other disorders of plasma-protein metabolism, not elsewhere classified: Secondary | ICD-10-CM | POA: Diagnosis present

## 2013-06-11 DIAGNOSIS — I214 Non-ST elevation (NSTEMI) myocardial infarction: Secondary | ICD-10-CM

## 2013-06-11 DIAGNOSIS — I428 Other cardiomyopathies: Secondary | ICD-10-CM | POA: Diagnosis present

## 2013-06-11 DIAGNOSIS — R630 Anorexia: Secondary | ICD-10-CM | POA: Diagnosis present

## 2013-06-11 DIAGNOSIS — I252 Old myocardial infarction: Secondary | ICD-10-CM

## 2013-06-11 DIAGNOSIS — D61818 Other pancytopenia: Secondary | ICD-10-CM

## 2013-06-11 DIAGNOSIS — D638 Anemia in other chronic diseases classified elsewhere: Secondary | ICD-10-CM | POA: Diagnosis present

## 2013-06-11 DIAGNOSIS — I429 Cardiomyopathy, unspecified: Secondary | ICD-10-CM

## 2013-06-11 DIAGNOSIS — R200 Anesthesia of skin: Secondary | ICD-10-CM

## 2013-06-11 DIAGNOSIS — N179 Acute kidney failure, unspecified: Secondary | ICD-10-CM

## 2013-06-11 DIAGNOSIS — Z66 Do not resuscitate: Secondary | ICD-10-CM | POA: Diagnosis present

## 2013-06-11 DIAGNOSIS — L89109 Pressure ulcer of unspecified part of back, unspecified stage: Secondary | ICD-10-CM | POA: Diagnosis present

## 2013-06-11 DIAGNOSIS — M810 Age-related osteoporosis without current pathological fracture: Secondary | ICD-10-CM | POA: Diagnosis present

## 2013-06-11 DIAGNOSIS — Z86718 Personal history of other venous thrombosis and embolism: Secondary | ICD-10-CM

## 2013-06-11 DIAGNOSIS — I4891 Unspecified atrial fibrillation: Secondary | ICD-10-CM | POA: Diagnosis present

## 2013-06-11 DIAGNOSIS — R531 Weakness: Secondary | ICD-10-CM | POA: Diagnosis present

## 2013-06-11 DIAGNOSIS — G7000 Myasthenia gravis without (acute) exacerbation: Secondary | ICD-10-CM | POA: Diagnosis present

## 2013-06-11 DIAGNOSIS — K56609 Unspecified intestinal obstruction, unspecified as to partial versus complete obstruction: Secondary | ICD-10-CM

## 2013-06-11 DIAGNOSIS — K5190 Ulcerative colitis, unspecified, without complications: Secondary | ICD-10-CM | POA: Diagnosis present

## 2013-06-11 DIAGNOSIS — E43 Unspecified severe protein-calorie malnutrition: Secondary | ICD-10-CM | POA: Diagnosis present

## 2013-06-11 DIAGNOSIS — I129 Hypertensive chronic kidney disease with stage 1 through stage 4 chronic kidney disease, or unspecified chronic kidney disease: Secondary | ICD-10-CM | POA: Diagnosis present

## 2013-06-11 DIAGNOSIS — K828 Other specified diseases of gallbladder: Secondary | ICD-10-CM

## 2013-06-11 DIAGNOSIS — Z79899 Other long term (current) drug therapy: Secondary | ICD-10-CM

## 2013-06-11 DIAGNOSIS — Z8542 Personal history of malignant neoplasm of other parts of uterus: Secondary | ICD-10-CM

## 2013-06-11 DIAGNOSIS — G610 Guillain-Barre syndrome: Principal | ICD-10-CM | POA: Diagnosis present

## 2013-06-11 DIAGNOSIS — J9601 Acute respiratory failure with hypoxia: Secondary | ICD-10-CM

## 2013-06-11 DIAGNOSIS — I82403 Acute embolism and thrombosis of unspecified deep veins of lower extremity, bilateral: Secondary | ICD-10-CM

## 2013-06-11 DIAGNOSIS — Z8249 Family history of ischemic heart disease and other diseases of the circulatory system: Secondary | ICD-10-CM

## 2013-06-11 DIAGNOSIS — R600 Localized edema: Secondary | ICD-10-CM | POA: Diagnosis present

## 2013-06-11 DIAGNOSIS — Z6825 Body mass index (BMI) 25.0-25.9, adult: Secondary | ICD-10-CM

## 2013-06-11 DIAGNOSIS — R209 Unspecified disturbances of skin sensation: Secondary | ICD-10-CM

## 2013-06-11 DIAGNOSIS — E876 Hypokalemia: Secondary | ICD-10-CM

## 2013-06-11 DIAGNOSIS — Z888 Allergy status to other drugs, medicaments and biological substances status: Secondary | ICD-10-CM

## 2013-06-11 DIAGNOSIS — I272 Pulmonary hypertension, unspecified: Secondary | ICD-10-CM

## 2013-06-11 DIAGNOSIS — F411 Generalized anxiety disorder: Secondary | ICD-10-CM | POA: Diagnosis present

## 2013-06-11 LAB — URINALYSIS, ROUTINE W REFLEX MICROSCOPIC
BILIRUBIN URINE: NEGATIVE
Glucose, UA: NEGATIVE mg/dL
HGB URINE DIPSTICK: NEGATIVE
Ketones, ur: NEGATIVE mg/dL
Nitrite: NEGATIVE
PH: 5 (ref 5.0–8.0)
Protein, ur: NEGATIVE mg/dL
Specific Gravity, Urine: 1.018 (ref 1.005–1.030)
Urobilinogen, UA: 0.2 mg/dL (ref 0.0–1.0)

## 2013-06-11 LAB — URINE MICROSCOPIC-ADD ON

## 2013-06-11 LAB — CBC
HCT: 29.4 % — ABNORMAL LOW (ref 36.0–46.0)
HEMOGLOBIN: 9.9 g/dL — AB (ref 12.0–15.0)
MCH: 30.3 pg (ref 26.0–34.0)
MCHC: 33.7 g/dL (ref 30.0–36.0)
MCV: 89.9 fL (ref 78.0–100.0)
Platelets: 95 10*3/uL — ABNORMAL LOW (ref 150–400)
RBC: 3.27 MIL/uL — ABNORMAL LOW (ref 3.87–5.11)
RDW: 15.3 % (ref 11.5–15.5)
WBC: 5.3 10*3/uL (ref 4.0–10.5)

## 2013-06-11 LAB — HEMOGLOBIN A1C
Hgb A1c MFr Bld: 5.5 % (ref <5.7)
Mean Plasma Glucose: 111 mg/dL (ref <117)

## 2013-06-11 LAB — COMPREHENSIVE METABOLIC PANEL
ALT: 15 U/L (ref 0–35)
AST: 20 U/L (ref 0–37)
Albumin: 1.9 g/dL — ABNORMAL LOW (ref 3.5–5.2)
Alkaline Phosphatase: 75 U/L (ref 39–117)
BUN: 21 mg/dL (ref 6–23)
CO2: 20 mEq/L (ref 19–32)
Calcium: 7.9 mg/dL — ABNORMAL LOW (ref 8.4–10.5)
Chloride: 106 mEq/L (ref 96–112)
Creatinine, Ser: 0.96 mg/dL (ref 0.50–1.10)
GFR calc Af Amer: 60 mL/min — ABNORMAL LOW (ref >90)
GFR calc non Af Amer: 52 mL/min — ABNORMAL LOW (ref >90)
Glucose, Bld: 94 mg/dL (ref 70–99)
POTASSIUM: 4.2 meq/L (ref 3.7–5.3)
SODIUM: 137 meq/L (ref 137–147)
TOTAL PROTEIN: 3.8 g/dL — AB (ref 6.0–8.3)
Total Bilirubin: 0.6 mg/dL (ref 0.3–1.2)

## 2013-06-11 LAB — TROPONIN I: Troponin I: <0.3 ng/mL (ref <0.30)

## 2013-06-11 LAB — TECHNOLOGIST SMEAR REVIEW

## 2013-06-11 LAB — GLUCOSE, CAPILLARY: Glucose-Capillary: 83 mg/dL (ref 70–99)

## 2013-06-11 LAB — CK: CK TOTAL: 26 U/L (ref 7–177)

## 2013-06-11 MED ORDER — MYCOPHENOLATE MOFETIL 250 MG PO CAPS
1000.0000 mg | ORAL_CAPSULE | Freq: Two times a day (BID) | ORAL | Status: DC
  Administered 2013-06-11 – 2013-06-19 (×16): 1000 mg via ORAL
  Filled 2013-06-11 (×18): qty 4

## 2013-06-11 MED ORDER — ASPIRIN 81 MG PO CHEW
81.0000 mg | CHEWABLE_TABLET | Freq: Every day | ORAL | Status: DC
  Administered 2013-06-11 – 2013-06-19 (×9): 81 mg via ORAL
  Filled 2013-06-11 (×9): qty 1

## 2013-06-11 MED ORDER — BISACODYL 10 MG RE SUPP: 10.0000 mg | Freq: Every day | RECTAL | Status: DC | PRN

## 2013-06-11 MED ORDER — ACETAMINOPHEN 325 MG PO TABS
650.0000 mg | ORAL_TABLET | Freq: Four times a day (QID) | ORAL | Status: DC | PRN
  Administered 2013-06-13 – 2013-06-14 (×2): 650 mg via ORAL
  Filled 2013-06-11 (×2): qty 2

## 2013-06-11 MED ORDER — ENSURE COMPLETE PO LIQD
237.0000 mL | Freq: Every day | ORAL | Status: DC
  Administered 2013-06-11 – 2013-06-12 (×2): 237 mL via ORAL

## 2013-06-11 MED ORDER — SODIUM CHLORIDE 0.9 % IJ SOLN
3.0000 mL | Freq: Two times a day (BID) | INTRAMUSCULAR | Status: DC
  Administered 2013-06-15 – 2013-06-18 (×3): 3 mL via INTRAVENOUS

## 2013-06-11 MED ORDER — SODIUM CHLORIDE 0.9 % IV SOLN
INTRAVENOUS | Status: AC
  Administered 2013-06-11: 21:00:00 via INTRAVENOUS

## 2013-06-11 MED ORDER — PANTOPRAZOLE SODIUM 40 MG PO TBEC
40.0000 mg | DELAYED_RELEASE_TABLET | Freq: Every day | ORAL | Status: DC
  Administered 2013-06-11 – 2013-06-19 (×9): 40 mg via ORAL
  Filled 2013-06-11 (×7): qty 1

## 2013-06-11 MED ORDER — ALPRAZOLAM 0.25 MG PO TABS
0.2500 mg | ORAL_TABLET | Freq: Two times a day (BID) | ORAL | Status: DC | PRN
  Filled 2013-06-11: qty 1

## 2013-06-11 MED ORDER — POLYETHYLENE GLYCOL 3350 17 G PO PACK
17.0000 g | PACK | Freq: Every day | ORAL | Status: DC | PRN
  Filled 2013-06-11: qty 1

## 2013-06-11 MED ORDER — LEVOTHYROXINE SODIUM 125 MCG PO TABS
125.0000 ug | ORAL_TABLET | Freq: Every day | ORAL | Status: DC
  Administered 2013-06-12 – 2013-06-19 (×8): 125 ug via ORAL
  Filled 2013-06-11 (×10): qty 1

## 2013-06-11 MED ORDER — MYCOPHENOLATE MOFETIL 500 MG PO TABS
1000.0000 mg | ORAL_TABLET | Freq: Two times a day (BID) | ORAL | Status: DC
  Filled 2013-06-11 (×2): qty 2

## 2013-06-11 MED ORDER — LOPERAMIDE HCL 2 MG PO CAPS
2.0000 mg | ORAL_CAPSULE | Freq: Every day | ORAL | Status: DC
  Administered 2013-06-12 – 2013-06-19 (×8): 2 mg via ORAL
  Filled 2013-06-11 (×8): qty 1

## 2013-06-11 MED ORDER — ACETAMINOPHEN 650 MG RE SUPP: 650.0000 mg | Freq: Four times a day (QID) | RECTAL | Status: DC | PRN

## 2013-06-11 MED ORDER — ATORVASTATIN CALCIUM 20 MG PO TABS
20.0000 mg | ORAL_TABLET | Freq: Every day | ORAL | Status: DC
  Administered 2013-06-11 – 2013-06-18 (×7): 20 mg via ORAL
  Filled 2013-06-11 (×10): qty 1

## 2013-06-11 MED ORDER — ALPRAZOLAM 0.5 MG PO TABS
1.0000 mg | ORAL_TABLET | Freq: Every evening | ORAL | Status: DC | PRN
  Administered 2013-06-13 – 2013-06-14 (×2): 1 mg via ORAL
  Filled 2013-06-11 (×2): qty 2

## 2013-06-11 MED ORDER — ALPRAZOLAM 0.25 MG PO TABS
0.2500 mg | ORAL_TABLET | Freq: Two times a day (BID) | ORAL | Status: DC | PRN
  Administered 2013-06-15: 0.25 mg via ORAL

## 2013-06-11 MED ORDER — CARVEDILOL 6.25 MG PO TABS
6.2500 mg | ORAL_TABLET | Freq: Two times a day (BID) | ORAL | Status: DC
  Administered 2013-06-12 – 2013-06-15 (×7): 6.25 mg via ORAL
  Filled 2013-06-11 (×12): qty 1

## 2013-06-11 MED ORDER — LOPERAMIDE HCL 2 MG PO CAPS
4.0000 mg | ORAL_CAPSULE | Freq: Every day | ORAL | Status: DC
  Administered 2013-06-11 – 2013-06-18 (×8): 4 mg via ORAL
  Filled 2013-06-11 (×9): qty 2

## 2013-06-11 MED ORDER — PROMETHAZINE HCL 25 MG PO TABS
12.5000 mg | ORAL_TABLET | Freq: Four times a day (QID) | ORAL | Status: DC | PRN
  Administered 2013-06-13 – 2013-06-19 (×5): 12.5 mg via ORAL
  Filled 2013-06-11 (×5): qty 1

## 2013-06-11 MED ORDER — BUPROPION HCL ER (XL) 150 MG PO TB24
150.0000 mg | ORAL_TABLET | Freq: Every day | ORAL | Status: DC
  Administered 2013-06-12 – 2013-06-19 (×7): 150 mg via ORAL
  Filled 2013-06-11 (×11): qty 1

## 2013-06-11 MED ORDER — POTASSIUM CHLORIDE ER 10 MEQ PO TBCR
20.0000 meq | EXTENDED_RELEASE_TABLET | Freq: Two times a day (BID) | ORAL | Status: DC
  Administered 2013-06-11 – 2013-06-19 (×16): 20 meq via ORAL
  Filled 2013-06-11 (×18): qty 2

## 2013-06-11 MED ORDER — FERROUS SULFATE 325 (65 FE) MG PO TABS
325.0000 mg | ORAL_TABLET | Freq: Every day | ORAL | Status: DC
  Administered 2013-06-12 – 2013-06-19 (×7): 325 mg via ORAL
  Filled 2013-06-11 (×11): qty 1

## 2013-06-11 NOTE — Progress Notes (Signed)
[facility] called for transport to [facility].Harlene Ramus

## 2013-06-11 NOTE — Consult Note (Signed)
NEURO HOSPITALIST CONSULT NOTE    Reason for Consult: bilateral LE weakness and numbness  HPI:                                                                                                                                          [name] is an 80 y.o. female, with a past medical history significant for HTN, hyperlipidemia, MI, PAF off coumadin, TIA, hypothyroidism, left DVT, s/p IVC filter placement, vitamin B12 deficiency, myasthenia gravis on Cellcept, transferred to [facility] for further evaluation and management of the above stated symptoms. She initially presented to [facility] ED complaining of 3 days of progressive bilateral lower extremity weakness and numbness and was subsequently transferred to [facility]. [name] said that she is usually able to ambulate with the help of a walker and needs minimal assistance to perform her activities of daily living. However, this past Sunday she started noticing that her legs were weaker and numb, then got worse the next day and yesterday couldn't move her legs at all. Denies pain in her neck, back , or legs, and no similar symptoms in her upper extremities. No bladder or bowel impairment. Furthermore, denies HA, vertigo, slurred speech, language or vision impairment. Has chronic intermittent double vision. Denies recent fever or infection. Last MG exacerbation was years ago.  Past Medical History  Diagnosis Date  . Anemia   . Hyperlipemia   . Hypertension   . Thyroid disease     hypothyroidism  . Anxiety   . Vitamin B 12 deficiency   . TIA (transient ischemic attack)     4-09  . Microscopic hematuria     benign microscopic hematuria, worked up b Dr Katrine Coho  . Gall stone     porcelain gall bladder with single stone  per CT 02-2009 Dr Newton Pigg  . Osteoporosis     DEXA on 04-30-10  . Myasthenia gravis     sees Dr. Krista Blue   . Ulcerative colitis   . DVT of leg (deep venous thrombosis) 12/2012    left leg; coumadin d/c  b/c of LGI bleed; s/p IVC filter  . CMV colitis   . Cancer     uterine/ovarian  . Chronic systolic CHF (congestive heart failure)   . Cardiomyopathy     a. in setting of incarcerated hernia, AF with RVR, ARF, NSTEMI => probable [place] CM given WM abnormality; Echocardiogram (03/23/13): Mod LVH, EF 30-35% with AK of the entire apical myocardium, AS/AL/Inf HK, Gr 1 DD, aortic sclerosis, MAC, mild MR, atrial septal lipomatous hypertrophy, mod TR, PASP 37;   Echo (04/2013): EF 60-65% normal wall motion, Gr 1 DD, MAC, mild to mod LAE  . Paroxysmal atrial fibrillation     not on coumadin due to AF in setting  of acute illness as well as prior GI bleed on coumadin for DVT  . NSTEMI (non-ST elevated myocardial infarction)     a. 03/2013 in setting of incarcerated ventral hernia, SBO, ARF => likely Type 2 NSTEMI    Past Surgical History  Procedure Laterality Date  . Cardiac catheterization      04/1999  . Abdominal hysterectomy      with oophorectomy  . Colonoscopy  1999    normal   . Colonoscopy N/A 12/10/2012    Procedure: COLONOSCOPY;  Surgeon: Gatha Mayer, MD;  Location: [facility];  Service: Endoscopy;  Laterality: N/A;  . Flexible sigmoidoscopy N/A 01/26/2013    Procedure: FLEXIBLE SIGMOIDOSCOPY;  Surgeon: Gatha Mayer, MD;  Location: [facility];  Service: Endoscopy;  Laterality: N/A;  . Ventral hernia repair N/A 03/25/2013    Procedure: LAPAROSCOPIC VENTRAL HERNIA REPAIR ;  Surgeon: Adin Hector, MD;  Location: [facility] ORS;  Service: General;  Laterality: N/A;  . Cholecystectomy N/A 03/25/2013    Procedure: LAPAROSCOPIC CHOLECYSTECTOMY WITH INTRAOPERATIVE CHOLANGIOGRAM;  Surgeon: Adin Hector, MD;  Location: [facility];  Service: General;  Laterality: N/A;  . Insertion of mesh N/A 03/25/2013    Procedure: INSERTION OF MESH;  Surgeon: Adin Hector, MD;  Location: [facility];  Service: General;  Laterality: N/A;  . Laparoscopic lysis of adhesions N/A 03/25/2013    Procedure: LAPAROSCOPIC  LYSIS OF ADHESIONS;  Surgeon: Adin Hector, MD;  Location: [facility];  Service: General;  Laterality: N/A;  . Omentectomy N/A 03/25/2013    Procedure: PARTIAL OMENTECTOMY;  Surgeon: Adin Hector, MD;  Location: [facility];  Service: General;  Laterality: N/A;    Family History  Problem Relation Age of Onset  . Heart disease Father   . Heart disease Sister   . Colon cancer Daughter   . Heart disease Son   . Heart disease Daughter     Family History:  Social History:  reports that she has never smoked. She has never used smokeless tobacco. She reports that she does not drink alcohol or use illicit drugs.  Allergies  Allergen Reactions  . Zofran [Ondansetron Hcl]     hypotension  . Ivp Dye [Iodinated Diagnostic Agents] Rash  . Sulfamethoxazole Rash    REACTION: unspecified    MEDICATIONS:                                                                                                                     I have reviewed the patient's current medications.   ROS:  History obtained from the patient and chart review.  General ROS: negative for - chills, fatigue, fever, night sweats, weight gain or weight loss Psychological ROS: negative for - behavioral disorder, hallucinations, memory difficulties, mood swings or suicidal ideation Ophthalmic ROS: negative for - blurry vision, double vision, eye pain or loss of vision ENT ROS: negative for - epistaxis, nasal discharge, oral lesions, sore throat, tinnitus or vertigo Allergy and Immunology ROS: negative for - hives or itchy/watery eyes Hematological and Lymphatic ROS: negative for - bleeding problems, bruising or swollen lymph nodes Endocrine ROS: negative for - galactorrhea, hair pattern changes, polydipsia/polyuria or temperature intolerance Respiratory ROS: negative for - cough, hemoptysis, shortness of  breath or wheezing Cardiovascular ROS: negative for - chest pain, dyspnea on exertion, edema or irregular heartbeat Gastrointestinal ROS: negative for - abdominal pain, diarrhea, hematemesis, nausea/vomiting or stool incontinence Genito-Urinary ROS: negative for - dysuria, hematuria, incontinence or urinary frequency/urgency Musculoskeletal ROS: negative for - joint swelling Neurological ROS: as noted in HPI Dermatological ROS: negative for rash and skin lesion changes  Physical exam: pleasant female in no apparent distress. Blood pressure 140/66, pulse 71, temperature 98.2 F (36.8 C), temperature source Oral, resp. rate 20, height $RemoveBe'5\' 4"'HDEQShfCB$  (1.626 m), weight 70.308 kg (155 lb), SpO2 100.00%. Head: normocephalic. Neck: supple, no bruits, no JVD. Cardiac: no murmurs. Lungs: clear. Abdomen: soft, no tender, no mass. Extremities: mild BLE edema.  Neurologic Examination:                                                                                                      Mental Status: Alert, oriented, thought content appropriate.  Speech fluent without evidence of aphasia.  Able to follow 3 step commands without difficulty. Cranial Nerves: II: Discs flat bilaterally; Visual fields grossly normal, pupils equal, round, reactive to light and accommodation III,IV, VI: ptosis not present, extra-ocular motions intact bilaterally V,VII: smile symmetric, facial light touch sensation normal bilaterally VIII: hearing normal bilaterally IX,X: gag reflex present XI: bilateral shoulder shrug XII: midline tongue extension without atrophy or fasciculations  Motor: Right : Upper extremity   5/5    Left:     Upper extremity   5/5  Lower extremity   5/5     Lower extremity   5/5 Tone and bulk:normal tone throughout; no atrophy noted Sensory: Pinprick and light touch intact throughout, bilaterally Deep Tendon Reflexes:  2 upper extremities. 2 left knee and 1+ right knee jerk respectively.   Plantars: Right: downgoing   Left: downgoing Cerebellar: normal finger-to-nose, heel-to-shin test no tested. Gait: No tested. CV: pulses palpable throughout    Lab Results  Component Value Date/Time   CHOL 126 03/07/2012 11:22 AM    Results for orders placed during the hospital encounter of 06/11/13 (from the past 48 hour(s))  GLUCOSE, CAPILLARY     Status: None   Collection Time    06/11/13 10:46 AM      Result Value Range   Glucose-Capillary 83  70 - 99 mg/dL  CBC     Status: Abnormal   Collection Time  06/11/13 11:50 AM      Result Value Range   WBC 5.3  4.0 - 10.5 K/uL   RBC 3.27 (*) 3.87 - 5.11 MIL/uL   Hemoglobin 9.9 (*) 12.0 - 15.0 g/dL   HCT 29.4 (*) 36.0 - 46.0 %   MCV 89.9  78.0 - 100.0 fL   MCH 30.3  26.0 - 34.0 pg   MCHC 33.7  30.0 - 36.0 g/dL   RDW 15.3  11.5 - 15.5 %   Platelets 95 (*) 150 - 400 K/uL   Comment: SPECIMEN CHECKED FOR CLOTS     PLATELET COUNT CONFIRMED BY SMEAR  COMPREHENSIVE METABOLIC PANEL     Status: Abnormal   Collection Time    06/11/13 11:50 AM      Result Value Range   Sodium 137  137 - 147 mEq/L   Potassium 4.2  3.7 - 5.3 mEq/L   Chloride 106  96 - 112 mEq/L   CO2 20  19 - 32 mEq/L   Glucose, Bld 94  70 - 99 mg/dL   BUN 21  6 - 23 mg/dL   Creatinine, Ser 0.96  0.50 - 1.10 mg/dL   Calcium 7.9 (*) 8.4 - 10.5 mg/dL   Total Protein 3.8 (*) 6.0 - 8.3 g/dL   Albumin 1.9 (*) 3.5 - 5.2 g/dL   AST 20  0 - 37 U/L   ALT 15  0 - 35 U/L   Alkaline Phosphatase 75  39 - 117 U/L   Total Bilirubin 0.6  0.3 - 1.2 mg/dL   GFR calc non Af Amer 52 (*) >90 mL/min   GFR calc Af Amer 60 (*) >90 mL/min   Comment: (NOTE)     The eGFR has been calculated using the CKD EPI equation.     This calculation has not been validated in all clinical situations.     eGFR's persistently <90 mL/min signify possible Chronic Kidney     Disease.  TROPONIN I     Status: None   Collection Time    06/11/13 11:50 AM      Result Value Range   Troponin I <0.30   <0.30 ng/mL   Comment:            Due to the release kinetics of cTnI,     a negative result within the first hours     of the onset of symptoms does not rule out     myocardial infarction with certainty.     If myocardial infarction is still suspected,     repeat the test at appropriate intervals.  CK     Status: None   Collection Time    06/11/13 11:50 AM      Result Value Range   Total CK 26  7 - 177 U/L  URINALYSIS, ROUTINE W REFLEX MICROSCOPIC     Status: Abnormal   Collection Time    06/11/13 12:23 PM      Result Value Range   Color, Urine YELLOW  YELLOW   APPearance CLOUDY (*) CLEAR   Specific Gravity, Urine 1.018  1.005 - 1.030   pH 5.0  5.0 - 8.0   Glucose, UA NEGATIVE  NEGATIVE mg/dL   Hgb urine dipstick NEGATIVE  NEGATIVE   Bilirubin Urine NEGATIVE  NEGATIVE   Ketones, ur NEGATIVE  NEGATIVE mg/dL   Protein, ur NEGATIVE  NEGATIVE mg/dL   Urobilinogen, UA 0.2  0.0 - 1.0 mg/dL   Nitrite NEGATIVE  NEGATIVE  Leukocytes, UA SMALL (*) NEGATIVE  URINE MICROSCOPIC-ADD ON     Status: Abnormal   Collection Time    06/11/13 12:23 PM      Result Value Range   Squamous Epithelial / LPF MANY (*) RARE   WBC, UA 3-6  <3 WBC/hpf   RBC / HPF 0-2  <3 RBC/hpf   Bacteria, UA FEW (*) RARE   Crystals URIC ACID CRYSTALS (*) NEGATIVE   Urine-Other MUCOUS PRESENT    TROPONIN I     Status: None   Collection Time    06/11/13  6:00 PM      Result Value Range   Troponin I <0.30  <0.30 ng/mL   Comment:            Due to the release kinetics of cTnI,     a negative result within the first hours     of the onset of symptoms does not rule out     myocardial infarction with certainty.     If myocardial infarction is still suspected,     repeat the test at appropriate intervals.  HEMOGLOBIN A1C     Status: None   Collection Time    06/11/13  6:00 PM      Result Value Range   Hemoglobin A1C 5.5  <5.7 %   Comment: (NOTE)                                                                                According to the ADA Clinical Practice Recommendations for 2011, when     HbA1c is used as a screening test:      >=6.5%   Diagnostic of Diabetes Mellitus               (if abnormal result is confirmed)     5.7-6.4%   Increased risk of developing Diabetes Mellitus     References:Diagnosis and Classification of Diabetes Mellitus,Diabetes     Care,2011,34(Suppl 1):S62-S69 and Standards of Medical Care in             Diabetes - 2011,Diabetes Care,2011,34 (Suppl 1):S11-S61.   Mean Plasma Glucose 111  <117 mg/dL   Comment: Performed at [facility]  TECHNOLOGIST SMEAR REVIEW     Status: None   Collection Time    06/11/13  6:00 PM      Result Value Range   Tech Review HYPERSEGMENTED NEUT     Comment: ELLIPTOCYTES     ACANTHOCYTES    Dg Chest 2 View  06/11/2013   CLINICAL DATA:  Hypertension.  History of uterine cancer.  EXAM: CHEST  2 VIEW  COMPARISON:  Single view of the chest 03/25/2013.  FINDINGS: Port-A-Cath remains in place. There is cardiomegaly without edema. Lungs are clear. No pneumothorax or pleural effusion. No focal bony abnormality.  IMPRESSION: Cardiomegaly without acute disease.   Electronically Signed   By: Drusilla Kanner M.D.   On: 06/11/2013 13:29   Assessment/Plan: 80 y/o with progressive BLE weakness and numbness up her knees for the past 3 days. Neuro-exam shows no evidence of frank weakness LE, no obvious sensory, preserve left ankle jerk and diminished left ankle jerk. The overall findings are  not quite consistent with a myelopathy and of course her presentation is not related to her underlying MG. Although she is not frankly weak in her legs and the DTR's are relatively preserved, I can not entirely exclude the possibility of an acute demyelinating ascending polyneuropathy. Discussed at length with patient the need for MRI and LP if MRI negative. PT. Will follow up closely.     Dorian Pod, MD 06/11/2013, 11:09 PM

## 2013-06-11 NOTE — Progress Notes (Signed)
   CARE MANAGEMENT ED NOTE 06/11/2013  Patient:  LEKITA, KEREKES   Account Number:  [id number]  Date Initiated:  06/11/2013  Documentation initiated by:  Livia Snellen  Subjective/Objective Assessment:   patient here complaining of 3 days of progressive weakness and lower extremity fatigue     Subjective/Objective Assessment Detail:     Action/Plan:   Action/Plan Detail:   Anticipated DC Date:       Status Recommendation to Physician:   Result of Recommendation:    Other ED Services  Consult Working [place]  Other    Choice offered to / List presented to:            Status of service:  Completed, signed off  ED Comments:   ED Comments Detail:  EDCM spoke to patient at bedside.  Patient reports she lives with her oldest daughter and her son in [place].  Patinet is currently receiving home health services from [place].  Patient reports she has a walker, wheelchair, bedside commode, a seat built into the shower with safety rails in the bath tub  and handicapped toilet seat. Patient reports up until yestrday, she was able to complete her ADL's on her own.  Patient reports, "I just want my legs back."  [facility] text Erasmo Downer, transition specialist for [place] to let her know of patinet's admission. No further CM needs at this time.

## 2013-06-11 NOTE — ED Notes (Signed)
Patient does not have to urinate

## 2013-06-11 NOTE — ED Provider Notes (Signed)
CSN: [id number]     Arrival date & time 06/11/13  [id number] History   First MD Initiated Contact with Patient 06/11/13 1000     Chief Complaint  Patient presents with  . Fatigue  . Weakness   (Consider location/radiation/quality/duration/timing/severity/associated sxs/prior Treatment) Patient is a 80 y.o. female presenting with weakness. The history is provided by the patient.  Weakness   patient here complaining of 3 days of progressive weakness and lower extremity fatigue. History of similar symptoms associated with hypokalemia. No vomiting or diarrhea. No fever or chills. Denies any headache. No abdominal or chest pain. No cough or shortness of breath. Symptoms have been persistent and are better with rest and worse with exertion. No recent changes in her medications. No treatment used for this prior to arrival. Denies any confusion.  Past Medical History  Diagnosis Date  . Anemia   . Hyperlipemia   . Hypertension   . Thyroid disease     hypothyroidism  . Anxiety   . Vitamin B 12 deficiency   . TIA (transient ischemic attack)     4-09  . Microscopic hematuria     benign microscopic hematuria, worked up b Dr Katrine Coho  . Gall stone     porcelain gall bladder with single stone  per CT 02-2009 Dr Newton Pigg  . Osteoporosis     DEXA on 04-30-10  . Myasthenia gravis     sees Dr. Krista Blue   . Ulcerative colitis   . DVT of leg (deep venous thrombosis) 12/2012    left leg; coumadin d/c b/c of LGI bleed; s/p IVC filter  . CMV colitis   . Cancer     uterine/ovarian  . Chronic systolic CHF (congestive heart failure)   . Cardiomyopathy     a. 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 Paroxysmal atrial fibrillation     not on coumadin due to AF in setting of acute illness as well as prior GI bleed on coumadin for DVT  . NSTEMI (non-ST elevated myocardial infarction)     a. 03/2013 in setting of incarcerated ventral hernia, SBO, ARF => likely Type 2 NSTEMI   Past Surgical History  Procedure Laterality Date  . Cardiac catheterization      04/1999  . Abdominal hysterectomy      with oophorectomy  . Colonoscopy  1999    normal   . Colonoscopy N/A 12/10/2012    Procedure: COLONOSCOPY;  Surgeon: Gatha Mayer, MD;  Location: [facility];  Service: Endoscopy;  Laterality: N/A;  . Flexible sigmoidoscopy N/A 01/26/2013    Procedure: FLEXIBLE SIGMOIDOSCOPY;  Surgeon: Gatha Mayer, MD;  Location: [facility];  Service: Endoscopy;  Laterality: N/A;  . Ventral hernia repair N/A 03/25/2013    Procedure: LAPAROSCOPIC VENTRAL HERNIA REPAIR ;  Surgeon: Adin Hector, MD;  Location: [facility];  Service: General;  Laterality: N/A;  . Cholecystectomy N/A 03/25/2013    Procedure: LAPAROSCOPIC CHOLECYSTECTOMY WITH INTRAOPERATIVE CHOLANGIOGRAM;  Surgeon: Adin Hector, MD;  Location: [facility];  Service: General;  Laterality: N/A;  . Insertion of mesh N/A 03/25/2013    Procedure: INSERTION OF MESH;  Surgeon:  Adin Hector, MD;  Location: [facility];  Service: General;  Laterality: N/A;  . Laparoscopic lysis of adhesions N/A 03/25/2013    Procedure: LAPAROSCOPIC LYSIS OF ADHESIONS;  Surgeon: Adin Hector, MD;  Location: [facility];  Service: General;  Laterality: N/A;  . Omentectomy N/A 03/25/2013    Procedure: PARTIAL OMENTECTOMY;  Surgeon: Adin Hector, MD;  Location: [facility];  Service: General;  Laterality: N/A;   Family History  Problem Relation Age of Onset  . Heart disease Father    History  Substance Use Topics  . Smoking status: Never Smoker   . Smokeless tobacco: Never Used  . Alcohol Use: No   OB History   Grav Para Term Preterm Abortions TAB  SAB Ect Mult Living                 Review of Systems  Neurological: Positive for weakness.  All other systems reviewed and are negative.    Allergies  Zofran; Furosemide; Ivp dye; and Sulfamethoxazole  Home Medications   Current Outpatient Rx  Name  Route  Sig  Dispense  Refill  . ALPRAZolam (XANAX) 0.25 MG tablet   Oral   Take 1 tablet (0.25 mg total) by mouth 2 (two) times daily as needed for anxiety.   60 tablet   5   . ALPRAZolam (XANAX) 1 MG tablet   Oral   Take 1 tablet (1 mg total) by mouth at bedtime as needed for anxiety or sleep.   30 tablet   5   . aspirin 81 MG chewable tablet   Oral   Chew 1 tablet (81 mg total) by mouth daily.         Marland Kitchen atorvastatin (LIPITOR) 20 MG tablet   Oral   Take 1 tablet (20 mg total) by mouth daily with supper.   30 tablet   1   . buPROPion (WELLBUTRIN XL) 150 MG 24 hr tablet   Oral   Take 1 tablet (150 mg total) by mouth every morning.   30 tablet   11   . carvedilol (COREG) 6.25 MG tablet   Oral   Take 1 tablet (6.25 mg total) by mouth 2 (two) times daily with a meal.   60 tablet   11   . cyanocobalamin (,VITAMIN B-12,) 1000 MCG/ML injection   Intramuscular   Inject 1,000 mcg into the muscle every 30 (thirty) days.         . Esomeprazole Magnesium (NEXIUM PO)   Oral   Take by mouth daily.         . feeding supplement (ENSURE COMPLETE) LIQD   Oral   Take 237 mLs by mouth daily.   30 Bottle   0   . ferrous sulfate 325 (65 FE) MG tablet   Oral   Take 1 tablet (325 mg total) by mouth 3 (three) times daily with meals.   90 tablet   1   . furosemide (LASIX) 20 MG tablet   Oral   Take 1 tablet (20 mg total) by mouth daily as needed (take if weight goes up by 2 lbs overnight or 5 lbs in a week.).   30 tablet   1   . levothyroxine (SYNTHROID, LEVOTHROID) 125 MCG tablet   Oral   Take 1 tablet (125 mcg total) by mouth daily.   30 tablet   11   . loperamide (IMODIUM) 2 MG capsule   Oral   Take 1  capsule (2 mg total)  by mouth 2 (two) times daily.   90 capsule   0   . mycophenolate (CELLCEPT) 500 MG tablet   Oral   Take 2 tablets (1,000 mg total) by mouth 2 (two) times daily.   60 tablet   1   . potassium chloride (K-DUR) 10 MEQ tablet   Oral   Take 2 tablets (20 mEq total) by mouth daily.   60 tablet   11   . Probiotic Product (PROBIOTIC DAILY PO)   Oral   Take 1 tablet by mouth daily.          . prochlorperazine (COMPAZINE) 25 MG suppository   Rectal   Place 1 suppository (25 mg total) rectally every 12 (twelve) hours as needed for nausea or vomiting.   60 suppository   2   . psyllium (HYDROCIL/METAMUCIL) 95 % PACK   Oral   Take 1 packet by mouth 2 (two) times daily.   56 each      . vitamin C (ASCORBIC ACID) 250 MG tablet   Oral   Take 250 mg by mouth daily.          BP 123/58  Pulse 62  Resp 16  SpO2 100% Physical Exam  Nursing note and vitals reviewed. Constitutional: She is oriented to person, place, and time. She appears well-developed and well-nourished.  Non-toxic appearance. No distress.  HENT:  Head: Normocephalic and atraumatic.  Eyes: Conjunctivae, EOM and lids are normal. Pupils are equal, round, and reactive to light.  Neck: Normal range of motion. Neck supple. No tracheal deviation present. No mass present.  Cardiovascular: Normal rate, regular rhythm and normal heart sounds.  Exam reveals no gallop.   No murmur heard. Pulmonary/Chest: Effort normal and breath sounds normal. No stridor. No respiratory distress. She has no decreased breath sounds. She has no wheezes. She has no rhonchi. She has no rales.  Abdominal: Soft. Normal appearance and bowel sounds are normal. She exhibits no distension. There is no tenderness. There is no rebound and no CVA tenderness.  Musculoskeletal: Normal range of motion. She exhibits no edema and no tenderness.  Neurological: She is alert and oriented to person, place, and time. She has normal strength. No  cranial nerve deficit or sensory deficit. GCS eye subscore is 4. GCS verbal subscore is 5. GCS motor subscore is 6.  Skin: Skin is warm and dry. No abrasion and no rash noted.  Psychiatric: Her speech is normal and behavior is normal. Her affect is blunt.    ED Course  Procedures (including critical care time) Labs Review Labs Reviewed  URINE CULTURE  CBC  COMPREHENSIVE METABOLIC PANEL  URINALYSIS, ROUTINE W REFLEX MICROSCOPIC  TROPONIN I   Imaging Review No results found.  EKG Interpretation    Date/Time:  Tuesday June 11 2013 10:38:43 EST Ventricular Rate:  61 PR Interval:  77 QRS Duration: 149 QT Interval:  464 QTC Calculation: 467 R Axis:   -48 Text Interpretation:  Sinus rhythm Short PR interval Nonspecific IVCD with LAD Left ventricular hypertrophy Anterior infarct, old No significant change since last tracing Confirmed by [place] ([id number]) on 06/11/2013 12:30:59 PM            MDM  No diagnosis found.   Patient with progressive weakness and has normal strength in upper and lower extremities. Patellar reflexes are trace bilaterally. Symptoms are definitely worse when she stands up and describes decreased strength with use of her muscles. Patient to be evaluated by internal medicine  for admission  Leota Jacobsen, MD 06/11/13 [phone]

## 2013-06-11 NOTE — Progress Notes (Signed)
Received Bed # for [facility] 4N #12. Called report-given to

## 2013-06-11 NOTE — ED Notes (Signed)
Bed: WA04 Expected date:  Expected time:  Means of arrival:  Comments: 

## 2013-06-11 NOTE — H&P (Addendum)
[facility] History and Physical  [name] [id number] DOB: 1924-06-18 DOA: 06/11/2013  Referring physician:  Vivi Martens PCP:  Laurey Morale, MD   Chief Complaint:  Rapidly progressive weakness  HPI:  The patient is a 80 y.o. year-old female with history of myasthenia gravis on cellcept, uterine cancer, DVT s/p IVC filater because of GIB, hypothyroidism, NICM, PAF, anxiety.  Last fall, [name] was hospitalized for incarcerated hernia s/p surgical repair with concomitant cholecystectomy, course complicated by type 2 NSTEMI with cardiomyopathy and SBO.  She has also had CMV colitis and was taken off of her prednisone which had been used for her MG.  She has been living at home with her daughter and son-in-law and completing her ADLs with minimal assistance, using a walker for ambulation.  Two days ago, she was at her baseline.    Yesterday morning, she was getting around normally, however, over the day yesterday, she developed difficulty getting off the toilet, then later difficulty getting up from the bed and finally she was unable to sit up at all without assistance.   She also complained of numbness which progressed to the level of the knee.  She denies focal weakness, although she states her legs are weaker than her arms.  Her numbness is bilateral.  She denies confusion, slurred speech, facial droop, seizures, syncope.  Her MG has flared in the past, but usually her symptoms start with dysphagia and she has not had any dysphagia in the last day.  She has not had fevers, chills, sore throat, cough.  She has chronic nausea and anorexia and has lost about 10-lbs in the last month or so.  She has NBNB emesis occasionally.  She has chronic diarrhea about 2 times per day, applesauce--like or slightly thicker consistency, which has remained unchanged.  Denies dysuria.  She has had some muscle cramps recently.  Her family called EMS to transport her to the ER because they could not lift  her out of bed.  In the ER, WBC 5.3, hgb 9.9, at baseline, platelets 95, lower than previous (baseline 150-200).   BMP with suggestion of mild dehydration with mildly elevated BUN from baseline 21.  UA neg.  CXR neg.  VSS.  CK wnl.  Troponin < 0.3, ECG stable from prior.    Review of Systems:  General:  Denies fevers, chills.  Weight loss, poor appetite HEENT:  Chronic double vision which is corrected by her glasses.  Denies rhinorrhea, sinus congestion, sore throat CV:  Denies chest pain and palpitations, lower extremity edema.  PULM:  Denies SOB, wheezing, cough.   GI:  Per HPI.   GU:  Denies dysuria, frequency, urgency ENDO:  Denies polyuria, polydipsia.   HEME:  Denies hematemesis, blood in stools, melena, abnormal bruising or bleeding.  LYMPH:  Denies lymphadenopathy.   MSK:  Denies arthralgias, myalgias.   DERM:  Denies skin rash or ulcer.   NEURO:  Per HPI PSYCH:  Denies anxiety and depression.    Past Medical History  Diagnosis Date  . Anemia   . Hyperlipemia   . Hypertension   . Thyroid disease     hypothyroidism  . Anxiety   . Vitamin B 12 deficiency   . TIA (transient ischemic attack)     4-09  . Microscopic hematuria     benign microscopic hematuria, worked up b Dr Katrine Coho  . Gall stone     porcelain gall bladder with single stone  per CT 02-2009 Dr Newton Pigg  .  Osteoporosis     DEXA on 04-30-10  . Myasthenia gravis     sees Dr. Krista Blue   . Ulcerative colitis   . DVT of leg (deep venous thrombosis) 12/2012    left leg; coumadin d/c b/c of LGI bleed; s/p IVC filter  . CMV colitis   . Cancer     uterine/ovarian  . Chronic systolic CHF (congestive heart failure)   . Cardiomyopathy     a. 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 Paroxysmal atrial fibrillation     not on coumadin due to AF in setting of acute illness as well as prior GI bleed on coumadin for DVT  . NSTEMI (non-ST elevated myocardial infarction)     a. 03/2013 in setting of incarcerated ventral hernia, SBO, ARF => likely Type 2 NSTEMI   Past Surgical History  Procedure Laterality Date  . Cardiac catheterization      04/1999  . Abdominal hysterectomy      with oophorectomy  . Colonoscopy  1999    normal   . Colonoscopy N/A 12/10/2012    Procedure: COLONOSCOPY;  Surgeon: Gatha Mayer, MD;  Location: [facility];  Service: Endoscopy;  Laterality: N/A;  . Flexible sigmoidoscopy N/A 01/26/2013    Procedure: FLEXIBLE SIGMOIDOSCOPY;  Surgeon: Gatha Mayer, MD;  Location: [facility];  Service: Endoscopy;  Laterality: N/A;  . Ventral hernia repair N/A 03/25/2013    Procedure: LAPAROSCOPIC VENTRAL HERNIA REPAIR ;  Surgeon: Adin Hector, MD;  Location: [facility];  Service: General;  Laterality: N/A;  . Cholecystectomy N/A 03/25/2013    Procedure: LAPAROSCOPIC CHOLECYSTECTOMY WITH INTRAOPERATIVE CHOLANGIOGRAM;  Surgeon: Adin Hector, MD;  Location: [facility];  Service: General;  Laterality: N/A;  . Insertion of mesh N/A 03/25/2013    Procedure: INSERTION OF MESH;  Surgeon: Adin Hector, MD;  Location: [facility];  Service: General;  Laterality: N/A;  . Laparoscopic lysis of adhesions N/A 03/25/2013    Procedure: LAPAROSCOPIC LYSIS OF ADHESIONS;  Surgeon: Adin Hector, MD;  Location: [facility];  Service: General;  Laterality: N/A;  . Omentectomy N/A 03/25/2013    Procedure: PARTIAL OMENTECTOMY;  Surgeon: Adin Hector, MD;  Location: [facility];  Service: General;  Laterality: N/A;   Social History:  reports that she has never smoked. She has never used smokeless tobacco. She reports that she does not drink alcohol or use illicit drugs. Lives with her  daughter and son-in-law and normally gets around with rolling walker.  [facility]  Allergies  Allergen Reactions  . Zofran [Ondansetron Hcl]     hypotension  . Ivp Dye [Iodinated Diagnostic Agents] Rash  . Sulfamethoxazole Rash    REACTION: unspecified    Family History  Problem Relation Age of Onset  . Heart disease Father   . Heart disease Sister   . Colon cancer Daughter   . Heart disease Son   . Heart disease Daughter      Prior to Admission medications   Medication Sig Start Date End Date Taking? Authorizing Provider  ALPRAZolam (XANAX) 0.25 MG tablet Take 0.25 mg by mouth 2 (two) times daily as  needed for anxiety. 05/07/13  Yes Nelwyn Salisbury, MD  ALPRAZolam Prudy Feeler) 1 MG tablet Take 1 mg by mouth at bedtime as needed for anxiety or sleep. 05/07/13  Yes Nelwyn Salisbury, MD  aspirin 81 MG chewable tablet Chew 1 tablet (81 mg total) by mouth daily. 04/11/13  Yes Evlyn Kanner Love, PA-C  atorvastatin (LIPITOR) 20 MG tablet Take 1 tablet (20 mg total) by mouth daily with supper. 02/12/13  Yes Daniel J Angiulli, PA-C  buPROPion (WELLBUTRIN XL) 150 MG 24 hr tablet Take 1 tablet (150 mg total) by mouth every morning. 05/07/13 12015/04/23 Yes Nelwyn Salisbury, MD  carvedilol (COREG) 6.25 MG tablet Take 1 tablet (6.25 mg total) by mouth 2 (two) times daily with a meal. 05/31/13  Yes Kathleene Hazel, MD  cyanocobalamin (,VITAMIN B-12,) 1000 MCG/ML injection Inject 1,000 mcg into the muscle every 30 (thirty) days.   Yes Historical Provider, MD  Esomeprazole Magnesium (NEXIUM PO) Take 1 capsule by mouth daily.    Yes Historical Provider, MD  feeding supplement (ENSURE COMPLETE) LIQD Take 237 mLs by mouth daily. 12/12/12  Yes Laveda Norman, MD  ferrous sulfate 325 (65 FE) MG tablet Take 325 mg by mouth daily with breakfast. 04/11/13  Yes Evlyn Kanner Love, PA-C  furosemide (LASIX) 20 MG tablet Take 1 tablet (20 mg total) by mouth daily as needed (take if weight goes up by 2 lbs overnight or 5 lbs in  a week.). 04/11/13  Yes Evlyn Kanner Love, PA-C  Glucosamine HCl (GLUCOSAMINE PO) Take 1 tablet by mouth daily.   Yes Historical Provider, MD  levothyroxine (SYNTHROID, LEVOTHROID) 125 MCG tablet Take 1 tablet (125 mcg total) by mouth daily. 05/07/13  Yes Nelwyn Salisbury, MD  loperamide (IMODIUM) 2 MG capsule Take 2-4 mg by mouth 2 (two) times daily. 2mg  in the morning and 4mg  at night 04/11/13  Yes Evlyn Kanner Love, PA-C  mycophenolate (CELLCEPT) 500 MG tablet Take 2 tablets (1,000 mg total) by mouth 2 (two) times daily. 02/12/13  Yes Daniel J Angiulli, PA-C  potassium chloride (K-DUR) 10 MEQ tablet Take 20 mEq by mouth 2 (two) times daily. 05/07/13  Yes Nelwyn Salisbury, MD  Probiotic Product (PROBIOTIC DAILY PO) Take 1 tablet by mouth daily.    Yes Historical Provider, MD   Physical Exam: Filed Vitals:   06/11/13 1006 06/11/13 1048 06/11/13 1217 06/11/13 1637  BP: 123/58  139/66 119/56  Pulse: 62  65 64  Temp:  98.2 F (36.8 C)    TempSrc:  Rectal    Resp: 16  19 19   SpO2: 100%  99% 100%     General:  CF, NAD, lying on stretcher  Eyes:  PERRL, anicteric, non-injected.  ENT:  Nares clear.  OP clear, non-erythematous without plaques or exudates.  MMM.  Neck:  Supple without TM or JVD.    Lymph:  No cervical, supraclavicular, or submandibular LAD.  Cardiovascular:  RRR, normal S1, S2, without m/r/g.  2+ pulses, warm extremities  Respiratory:  CTA bilaterally without increased WOB.  Abdomen:  NABS.  Soft, ND/NT.    Skin:  No rashes or focal lesions.  Musculoskeletal:  Normal bulk and tone.  2+ bilateral LE edema, 1+  Psychiatric:  A & O x 4.  Appropriate affect.  Neurologic:  CN 3-12 intact.  5-/5 strength bilateral upper extremities with 2+ bicep reflex.  Able to lift bilateral legs about 3-inches off stretcher and hold for 7 seconds, but could not raise  or keep them higher.  4/5 bilateral hips, ankles 5/5.  Absent right patellar reflex and 2+ left patellar reflex.  Able to feel light  touch bilateral lower legs, but sensation is blunted compared to upper legs above level of knee.    Labs on Admission:  Basic Metabolic Panel:  Recent Labs Lab 06/11/13 1150  NA 137  K 4.2  CL 106  CO2 20  GLUCOSE 94  BUN 21  CREATININE 0.96  CALCIUM 7.9*   Liver Function Tests:  Recent Labs Lab 06/11/13 1150  AST 20  ALT 15  ALKPHOS 75  BILITOT 0.6  PROT 3.8*  ALBUMIN 1.9*   No results found for this basename: LIPASE, AMYLASE,  in the last 168 hours No results found for this basename: AMMONIA,  in the last 168 hours CBC:  Recent Labs Lab 06/11/13 1150  WBC 5.3  HGB 9.9*  HCT 29.4*  MCV 89.9  PLT 95*   Cardiac Enzymes:  Recent Labs Lab 06/11/13 1150  CKTOTAL 26  TROPONINI <0.30    BNP (last 3 results)  Recent Labs  12/08/12 0026 03/27/13 0120  PROBNP 465.5* 5592.0*   CBG:  Recent Labs Lab 06/11/13 1046  GLUCAP 83    Radiological Exams on Admission: Dg Chest 2 View  06/11/2013   CLINICAL DATA:  Hypertension.  History of uterine cancer.  EXAM: CHEST  2 VIEW  COMPARISON:  Single view of the chest 03/25/2013.  FINDINGS: Port-A-Cath remains in place. There is cardiomegaly without edema. Lungs are clear. No pneumothorax or pleural effusion. No focal bony abnormality.  IMPRESSION: Cardiomegaly without acute disease.   Electronically Signed   By: Inge Rise M.D.   On: 06/11/2013 13:29    EKG: Independently reviewed. NSR, Q-waves in anterior leads consistent with prior, no new ST-segment elevations or depressions, no acute ischemia.  Shortened PR interval.    Assessment/Plan Principal Problem:   Proximal muscle weakness Active Problems:   HYPOTHYROIDISM   ANXIETY   Anemia of chronic disease   Protein-calorie malnutrition, severe   Pedal edema   Cardiomyopathy   Weakness   Numbness of legs  ---  Progressive weakness and lower extremity numbness.  DDx includes occult infection, GBS/transverse myelitis, myasthenia flare,  endocrinopathy such as hypothyroidism or adrenal insufficiency.  GBS/transverse myelitis possible. No obvious infections suggested by history, UA, or CXR.  These symptoms are not consistent with her previous myasthenia flare and that would not explain her numbness.  She does not appear significantly dehydrated on exam or by labs.  Has only moderate thrombocytopenia and hgb stable so TTP less likely. -  Check TSH and cortisol level -  Smear to check for schistocytes -  Continue to monitor for symptoms of underlying infection -  q4h neuro checks -  Continue pulse ox  -  Neurology consultation - spoke with Dr. Doy Mince from neurology who requested patient be transferred to [facility] -  Will hold heparin in case she needs urgent LP -  PT/OT  -  Gentle hydration overnight -  Air mattress  Myasthenia Gravis with progressive weakness -  Choose medications carefully  -  Continue cellcept  -  Neurology consultation -  Monitor for evidence of respiratory distress  PAF, not on A/C secondary to GIB, currently NSR HTN/HLD, BP stable, hold statin in acute setting and continue BB ICM, weight stable leg swelling and weight loss.  CXR without interstitial edema.  Hold lasix Lower extremity edema, stable and likely due to low albumin,  although she also has hx of DVT (has IVC filter) -   TED hose, elevate when able  CKD stage II/III, stable.  Minimize nephrotoxins and renally dose medications  Hypothyroidism, check TSH and continue synthroid  Anxiety, stable.  Continue prn xanax  CMV colitis s/p valcyte but with ongoing diarrhea requiring imodium  Anemia of chronic disease, hemoglobin stable.  Defer to PCP  Thrombocytopenia, new onset, likely acute phase reactant -  Check smear for platelet morphology and eval for schistocytes  Progressive weight loss likely   -  Start prn phenergan -  consider appetite stimulant once acute problem has been further investigated  Severe protein-calorie  malnutrition with hypoalbuminemia and weight loss due to anorexia with nausea -  Nutrition consultation -  Dietary supplements -  Liberalized diet  Sacral decubitus ulcer -  Wound care consult -  Air mattress  Hx of uterine CA in remission DVT LLE s/p IVC filter and not on A/C due to GIB  Diet:  regular Access:  port IVF:  yes Proph:  SCD  Code Status: DNR Family Communication: patient, daughter, and son-in-law Disposition Plan: Admit to telemetry  Time spent: 60 min Janece Canterbury [facility] Pager [phone]  If 7PM-7AM, please contact night-coverage [email or web address] Password [email or web address] 06/11/2013, 5:14 PM

## 2013-06-11 NOTE — ED Notes (Signed)
Per EMS: Pt from home c/o progressive fatigue/weakness x 3 days.  States that now she does not have energy to walk.  States this has happened before and she had to go to rehab to regain strength.  PMS is good.  Just unable to stand.

## 2013-06-12 ENCOUNTER — Inpatient Hospital Stay (HOSPITAL_COMMUNITY): Payer: Medicare Other

## 2013-06-12 DIAGNOSIS — R609 Edema, unspecified: Secondary | ICD-10-CM

## 2013-06-12 DIAGNOSIS — N179 Acute kidney failure, unspecified: Secondary | ICD-10-CM

## 2013-06-12 LAB — URINE CULTURE: Colony Count: 30000

## 2013-06-12 LAB — VITAMIN B12: Vitamin B-12: 782 pg/mL (ref 211–911)

## 2013-06-12 LAB — TSH: TSH: 0.974 u[IU]/mL (ref 0.350–4.500)

## 2013-06-12 LAB — RPR: RPR Ser Ql: NONREACTIVE

## 2013-06-12 LAB — CORTISOL: Cortisol, Plasma: 19.6 ug/dL

## 2013-06-12 MED ORDER — ADULT MULTIVITAMIN W/MINERALS CH
1.0000 | ORAL_TABLET | Freq: Every day | ORAL | Status: DC
  Administered 2013-06-12 – 2013-06-19 (×8): 1 via ORAL
  Filled 2013-06-12 (×8): qty 1

## 2013-06-12 MED ORDER — ENSURE COMPLETE PO LIQD
237.0000 mL | Freq: Two times a day (BID) | ORAL | Status: DC
  Administered 2013-06-12 – 2013-06-18 (×8): 237 mL via ORAL

## 2013-06-12 MED ORDER — SODIUM CHLORIDE 0.9 % IJ SOLN
10.0000 mL | INTRAMUSCULAR | Status: DC | PRN
  Administered 2013-06-12 – 2013-06-15 (×2): 20 mL
  Administered 2013-06-17 – 2013-06-18 (×2): 10 mL

## 2013-06-12 MED ORDER — JUVEN PO PACK
1.0000 | PACK | Freq: Two times a day (BID) | ORAL | Status: DC
  Administered 2013-06-12 – 2013-06-15 (×6): 1 via ORAL
  Filled 2013-06-12 (×14): qty 1

## 2013-06-12 NOTE — Progress Notes (Signed)
[facility]  Patient Status: Active (receiving services up to time of hospitalization)  [facility] is providing the following services: RN  If patient discharges after hours, please call [phone].   [name] 06/12/2013, 9:00 PM

## 2013-06-12 NOTE — Progress Notes (Signed)
Subjective: No significant changes.   Exam: Filed Vitals:   06/12/13 0935  BP: 100/55  Pulse: 66  Temp: 99 F (37.2 C)  Resp: 20   Gen: In bed, NAD MS: Awake, alert, oriented x 3  RJ:JOACZ, eomi Motor: able to give good strenght, but fatigues rapidly.  Sensory: decreased to LT/ absent vibration to the knees bilaterally.  YSA:YTKZS in UE, difficult to ellicit ankle jerks. Knee reflex present bilaterally.    Impression: 80 yo F with worsening gait difficulty and LE numbness. Possibilities include myasthenia causing LE fatiguability, GBS, B12 causing posterior column dysfunciton/neuropathy.   Recommendations: 1) B12/RPR 2) MRI as ordered.  3) PT/OT 4) consider LP after MRI if not clear.   Roland Rack, MD [facility] [phone]  If 7pm- 7am, please page neurology on call at [phone].

## 2013-06-12 NOTE — Progress Notes (Signed)
Agree with intern assessment.  Pryor Ochoa RD, LDN Pager: [phone] After Hours Pager: [phone]

## 2013-06-12 NOTE — Progress Notes (Signed)
INITIAL NUTRITION ASSESSMENT  DOCUMENTATION CODES Per approved criteria  -Severe malnutrition in the context of chronic illness   INTERVENTION: 1.  Ensure Complete BID, each supplement provides 350 kcals, 13 grams protein 2.  Multivitamin daily 3.  Juven BID, each packet provides 80 kcals  NUTRITION DIAGNOSIS: Predicted suboptimal energy intake related to poor appetite as evidenced by patient dietary recall and weight loss.   Goal: Patient to meet >/= 90% of estimated nutrition needs  Monitor:  Weight trends, lab trends, I/Os, PO intake and supplement acceptance  Reason for Assessment: Nutrition Consult, Malnutrition Screening Tool Risk  80 y.o. female  Admitting Dx: Proximal muscle weakness  ASSESSMENT: Patient is an 80 y.o. Female with PMH of myasthenia gravis on cellcept, uterine cancer, DVT s/p IVC filater because of GIB hypothyroidism, NICM, PAF, anxiety, anemia, and vitamin B12 deficiency. Patient was admitted to [facility] ED on 1/20 complaining of progressive muscle weakness over the previous 3 days. Patient was transferred to [facility] later on 1/20.    Patient reported that her usual weight was 220 lb and the last time she weighed this amount was in October 2013. She reports that she has lost 70 pounds in the past year due to a poor appetite. Per medical records, the patient has had a 10% weight loss since early December.   The patient stated that she usually eats 3 meals a day, but the meals she described were small and nutritionally inadequate (breakfast-cereal, lunch-soup, dinner-mashed potatoes). Patient reported that her appetite has been poor while in the hospital, and she said she ate soup for dinner on 1/20 and oatmeal and juice for breakfast on 1/21. Upon dietetic intern visit, patient was drinking Ensure Complete. The patient reported that she would try to drink 2 Ensure Completes a day. She also stated that she tries to drink 2 Ensure Completes per week while she  is at home.   Nutrition Focused Physical Exam:  Subcutaneous Fat:  Orbital Region: WNL Upper Arm Region: mild depletion Thoracic and Lumbar Region: moderate depletion  Muscle:  Temple Region: WNL Clavicle Bone Region: moderate depletion Clavicle and Acromion Bone Region: severe depletion Scapular Bone Region: severe depletion Dorsal Hand: WNL Patellar Region: WNL Anterior Thigh Region: WNL Posterior Calf Region: WNL  Edema: 2+ bilateral LE edema  Patient meets criteria for severe malnutrition in the context of chronic illness as evidenced by </= 75% energy intake for >/= 1 month, severe muscle mass depletion, and > 7.5% weight loss in 3 months.    Height: Ht Readings from Last 1 Encounters:  06/11/13 5\' 4"  (1.626 m)    Weight: Wt Readings from Last 1 Encounters:  06/11/13 155 lb (70.308 kg)    Ideal Body Weight: 120 lb  % Ideal Body Weight: 129%  Wt Readings from Last 10 Encounters:  06/11/13 155 lb (70.308 kg)  05/31/13 156 lb (70.761 kg)  05/07/13 164 lb (74.39 kg)  04/30/13 166 lb 8 oz (75.524 kg)  04/22/13 172 lb (78.019 kg)  04/17/13 168 lb 12.8 oz (76.567 kg)  04/11/13 174 lb 9.7 oz (79.2 kg)  04/02/13 181 lb 3.5 oz (82.2 kg)  04/02/13 181 lb 3.5 oz (82.2 kg)  03/18/13 159 lb 12 oz (72.462 kg)    Usual Body Weight: 220 lb  % Usual Body Weight: 70%  BMI:  Body mass index is 26.59 kg/(m^2).  Estimated Nutritional Needs: Kcal: 1700-1900 Protein: 85-95 grams Fluid: 1.7-1.9 L  Skin: sacral decubitus ulcer  Diet Order: General  EDUCATION NEEDS: -No education needs identified at this time   Intake/Output Summary (Last 24 hours) at 06/12/13 1002 Last data filed at 06/11/13 2330  Gross per 24 hour  Intake    660 ml  Output    500 ml  Net    160 ml    Last BM: 1/21  Labs:   Recent Labs Lab 06/11/13 1150  NA 137  K 4.2  CL 106  CO2 20  BUN 21  CREATININE 0.96  CALCIUM 7.9*  GLUCOSE 94    CBG (last 3)   Recent Labs   06/11/13 1046  GLUCAP 83    Scheduled Meds: . aspirin  81 mg Oral Daily  . atorvastatin  20 mg Oral Q supper  . buPROPion  150 mg Oral Q breakfast  . carvedilol  6.25 mg Oral BID WC  . feeding supplement (ENSURE COMPLETE)  237 mL Oral Daily  . ferrous sulfate  325 mg Oral Q breakfast  . levothyroxine  125 mcg Oral QAC breakfast  . loperamide  2 mg Oral Daily  . loperamide  4 mg Oral QHS  . mycophenolate  1,000 mg Oral BID  . pantoprazole  40 mg Oral Daily  . potassium chloride  20 mEq Oral BID  . sodium chloride  3 mL Intravenous Q12H    Continuous Infusions:   Past Medical History  Diagnosis Date  . Anemia   . Hyperlipemia   . Hypertension   . Thyroid disease     hypothyroidism  . Anxiety   . Vitamin B 12 deficiency   . TIA (transient ischemic attack)     4-09  . Microscopic hematuria     benign microscopic hematuria, worked up b Dr Katrine Coho  . Gall stone     porcelain gall bladder with single stone  per CT 02-2009 Dr Newton Pigg  . Osteoporosis     DEXA on 04-30-10  . Myasthenia gravis     sees Dr. Krista Blue   . Ulcerative colitis   . DVT of leg (deep venous thrombosis) 12/2012    left leg; coumadin d/c b/c of LGI bleed; s/p IVC filter  . CMV colitis   . Cancer     uterine/ovarian  . Chronic systolic CHF (congestive heart failure)   . Cardiomyopathy     a. in setting of incarcerated hernia, AF with RVR, ARF, NSTEMI => probable [place] CM given WM abnormality; Echocardiogram (03/23/13): Mod LVH, EF 30-35% with AK of the entire apical myocardium, AS/AL/Inf HK, Gr 1 DD, aortic sclerosis, MAC, mild MR, atrial septal lipomatous hypertrophy, mod TR, PASP 37;   Echo (04/2013): EF 60-65% normal wall motion, Gr 1 DD, MAC, mild to mod LAE  . Paroxysmal atrial fibrillation     not on coumadin due to AF in setting of acute illness as well as prior GI bleed on coumadin for DVT  . NSTEMI (non-ST elevated myocardial infarction)     a. 03/2013 in setting of incarcerated  ventral hernia, SBO, ARF => likely Type 2 NSTEMI    Past Surgical History  Procedure Laterality Date  . Cardiac catheterization      04/1999  . Abdominal hysterectomy      with oophorectomy  . Colonoscopy  1999    normal   . Colonoscopy N/A 12/10/2012    Procedure: COLONOSCOPY;  Surgeon: Gatha Mayer, MD;  Location: [facility] ENDOSCOPY;  Service: Endoscopy;  Laterality: N/A;  . Flexible sigmoidoscopy N/A 01/26/2013    Procedure: FLEXIBLE  SIGMOIDOSCOPY;  Surgeon: Gatha Mayer, MD;  Location: [facility];  Service: Endoscopy;  Laterality: N/A;  . Ventral hernia repair N/A 03/25/2013    Procedure: LAPAROSCOPIC VENTRAL HERNIA REPAIR ;  Surgeon: Adin Hector, MD;  Location: [facility] ORS;  Service: General;  Laterality: N/A;  . Cholecystectomy N/A 03/25/2013    Procedure: LAPAROSCOPIC CHOLECYSTECTOMY WITH INTRAOPERATIVE CHOLANGIOGRAM;  Surgeon: Adin Hector, MD;  Location: [facility] ORS;  Service: General;  Laterality: N/A;  . Insertion of mesh N/A 03/25/2013    Procedure: INSERTION OF MESH;  Surgeon: Adin Hector, MD;  Location: [facility] ORS;  Service: General;  Laterality: N/A;  . Laparoscopic lysis of adhesions N/A 03/25/2013    Procedure: LAPAROSCOPIC LYSIS OF ADHESIONS;  Surgeon: Adin Hector, MD;  Location: [facility] ORS;  Service: General;  Laterality: N/A;  . Omentectomy N/A 03/25/2013    Procedure: PARTIAL OMENTECTOMY;  Surgeon: Adin Hector, MD;  Location: [facility] ORS;  Service: General;  Laterality: N/A;    Claudell Kyle, Dietetic Intern Pager: [phone]

## 2013-06-12 NOTE — Consult Note (Signed)
WOC wound consult note Reason for Consult: Pressure ulcer to coccyx and sacrum, present on admission.  Wound type: Pressure ulcers Pressure Ulcer POA: Yes Measurement: Sacral wound (located more to the left side of sacrum) is 4 cm x 3 cm x 0.1 cm Coccyx wound 2 cm x 1.6 cm x 0.1 cm Wound bed: Both are 100% pink and moist. Drainage (amount, consistency, odor) Minimal serous drainage.  Periwound: Intact Dressing procedure/placement/frequency: Patient is placed on LALM for pressure redistribution.  Cleanse sacrum and coccyx with NS and pat gently dry.  Apply Allevyn silicone border foam (sacral shape) wound wounds to promote healing. Change every 3 days and PRN soilage.  Will not follow at this time.  Please re-consult if needed.  Domenic Moras RN BSN [place] Pager [phone]

## 2013-06-12 NOTE — Evaluation (Signed)
Physical Therapy Evaluation Patient Details Name: [name] MRN: [id number] DOB: 07-Jul-1924 Today's Date: 06/12/2013 Time: 1610-9604 PT Time Calculation (min): 22 min  PT Assessment / Plan / Recommendation History of Present Illness  The patient is a 80 y.o. year-old female with history of myasthenia gravis on cellcept, uterine cancer, DVT s/p IVC filater because of GIB, hypothyroidism, NICM, PAF, anxiety.  Last fall, [name] was hospitalized for incarcerated hernia s/p surgical repair with concomitant cholecystectomy, course complicated by type 2 NSTEMI with cardiomyopathy and SBO.  She has also had CMV colitis and was taken off of her prednisone which had been used for her MG.  She has been living at home with her daughter and son-in-law and completing her ADLs with minimal assistance, using a walker for ambulation.  Two days ago, she was at her baseline.  Pt then began experiencing rapid weakness and was adm to [place] then transferred to [facility].   Clinical Impression  Pt adm due to the above. Presents with limitations in functional mobility secondary to generalized LE weakness adn other indications listed below (see PT problem list). Pt to benefit from skilled acute PT to address deficits listed below and increase independence with mobility prior to D/C home with family. Pt does have 24/7 (A) upon D/C. Will recommend resumption of HHPT services upon D/C.   PT Assessment  Patient needs continued PT services    Follow Up Recommendations  Home health PT;Supervision/Assistance - 24 hour    Does the patient have the potential to tolerate intense rehabilitation      Barriers to Discharge        Equipment Recommendations  None recommended by PT    Recommendations for Other Services OT consult   Frequency Min 3X/week    Precautions / Restrictions Precautions Precautions: Fall Restrictions Weight Bearing Restrictions: No   Pertinent Vitals/Pain No complaints.       Mobility  Bed Mobility Overal bed mobility: Needs Assistance Bed Mobility: Supine to Sit Supine to sit: Min assist;HOB elevated General bed mobility comments: Pt required (A) to advance LEs to/off EOB; cues for hand placement and sequencing  Transfers Overall transfer level: Needs assistance Equipment used: 1 person hand held assist Transfers: Sit to/from [facility] Sit to Stand: Min assist Stand pivot transfers: Mod assist General transfer comment: pt able to power up but requires (A) to acheive upright standing position and max cues for upright posture; pt with narrow BOS during transfer and very anxioius due to recent weakness; max cues for hand placement and sequencing; use of gt belt to provide increas support during transfer          PT Diagnosis: Difficulty walking;Generalized weakness  PT Problem List: Decreased strength;Decreased activity tolerance;Decreased balance;Decreased mobility;Decreased knowledge of use of DME;Decreased safety awareness;Impaired sensation PT Treatment Interventions: DME instruction;Gait training;Functional mobility training;Therapeutic activities;Therapeutic exercise;Balance training;Neuromuscular re-education;Patient/family education     PT Goals(Current goals can be found in the care plan section) Acute Rehab PT Goals Patient Stated Goal: to go home with family after all the testing PT Goal Formulation: With patient Time For Goal Achievement: 06/26/13 Potential to Achieve Goals: Good  Visit Information  Last PT Received On: 06/12/13 Assistance Needed: +1 History of Present Illness: The patient is a 80 y.o. year-old female with history of myasthenia gravis on cellcept, uterine cancer, DVT s/p IVC filater because of GIB, hypothyroidism, NICM, PAF, anxiety.  Last fall, [name] was hospitalized for incarcerated hernia s/p surgical repair with concomitant cholecystectomy, course complicated  by type 2 NSTEMI with  cardiomyopathy and SBO.  She has also had CMV colitis and was taken off of her prednisone which had been used for her MG.  She has been living at home with her daughter and son-in-law and completing her ADLs with minimal assistance, using a walker for ambulation.  Two days ago, she was at her baseline.  Pt then began experiencing rapid weakness and was adm to [place] then transferred to [facility].        Prior [place] expects to be discharged to:: Private residence Living Arrangements: Children Available Help at Discharge: Family;Available 24 hours/day Type of Home: House Home Access: Level entry Home Layout: Two level;Able to live on main level with bedroom/bathroom Home Equipment: Walker - 4 wheels;Shower seat - built in;Grab bars - toilet;Bedside commode Additional Comments: has a built in shower seat and tub shower Prior Function Level of Independence: Independent with assistive device(s) Comments: ambulates with rollator  Communication Communication: No difficulties Dominant Hand: Right    Cognition  Cognition Arousal/Alertness: Awake/alert Behavior During Therapy: WFL for tasks assessed/performed Overall Cognitive Status: Within Functional Limits for tasks assessed    Extremity/Trunk Assessment Upper Extremity Assessment Upper Extremity Assessment: Defer to OT evaluation Lower Extremity Assessment Lower Extremity Assessment: Generalized weakness (pt c/o numbness and tingling in bil feet ) Cervical / Trunk Assessment Cervical / Trunk Assessment: Normal   Balance Balance Overall balance assessment: Needs assistance Sitting-balance support: Feet supported;Single extremity supported Sitting balance-Leahy Scale: Good Standing balance support: During functional activity;Single extremity supported Standing balance-Leahy Scale: Poor  End of Session PT - End of Session Equipment Utilized During Treatment: Gait belt Activity Tolerance: Patient  tolerated treatment well Patient left: in chair;with call bell/phone within reach;with chair alarm set Nurse Communication: Mobility status;Precautions  GP Functional Assessment Tool Used: clinical judgement  Functional Limitation: Mobility: Walking and moving around Mobility: Walking and Moving Around Current Status (X3235): At least 20 percent but less than 40 percent impaired, limited or restricted Mobility: Walking and Moving Around Goal Status [id number]): At least 1 percent but less than 20 percent impaired, limited or restricted   Gustavus Bryant, [place] 06/12/2013, 9:27 AM

## 2013-06-12 NOTE — Progress Notes (Signed)
CSW received referral for questionable SNF.   Chart reviewed. PT recommended HHPT  Will advise RN Case Manager for assessment of home health and DME needs.   CSW will sign off. Please re-consult is CSW needs arise.    Nayson Traweek LCSWA  [facility]  4N 1-16;  6N1-16 Phone: [phone]     

## 2013-06-12 NOTE — Progress Notes (Signed)
Pt transferred from [place] to [facility] earlier in shift. She presented to ED with LE weakness. Neuro wanted pt transferred to [facility] for further evaluation and workup. Per RN, no acute events since transfer. VSS. Neuro has seen pt in consult and plan is for MRI and LP if MRI neg. Note reviewed. Clance Boll, NP [facility]

## 2013-06-12 NOTE — Progress Notes (Signed)
[facility] PROGRESS NOTE  [name] [id number] DOB: 03-03-1925 DOA: 06/11/2013 PCP: Laurey Morale, MD  Assessment/Plan: Progressive weakness and lower extremity numbness. DDx includes occult infection, GBS/transverse myelitis, myasthenia flare, endocrinopathy such as hypothyroidism or adrenal insufficiency. GBS/transverse myelitis possible. No obvious infections suggested by history, UA, or CXR. These symptoms are not consistent with her previous myasthenia flare and that would not explain her numbness. She does not appear significantly dehydrated on exam or by labs. Has only moderate thrombocytopenia and hgb stable so TTP less likely.  - TSH ok and cortisol level ok - Continue to monitor for symptoms of underlying infection  - q4h neuro checks  - Neurology consultation - MRI pending and then LP if not explainatory - PT/OT  - Gentle hydration overnight  - Air mattress   Myasthenia Gravis with progressive weakness  - Continue cellcept  - Neurology consultation  - Monitor for evidence of respiratory distress   PAF, not on A/C secondary to GIB, currently NSR   HTN/HLD, BP stable, hold statin in acute setting and continue BB   ICM, weight stable leg swelling and weight loss. CXR without interstitial edema. Hold lasix  Lower extremity edema, stable and likely due to low albumin, although she also has hx of DVT (has IVC filter)  - TED hose, elevate when able   CKD stage II/III, stable. Minimize nephrotoxins and renally dose medications   Hypothyroidism, continue synthroid   Anxiety, stable. 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 Defer to PCP   Thrombocytopenia, new onset, likely acute phase reactant   Progressive weight loss likely  - Start prn phenergan  - consider appetite stimulant once acute problem has been further investigated   Severe protein-calorie  malnutrition with hypoalbuminemia and weight loss due to anorexia with nausea  - Nutrition consultation  - Dietary supplements  - Liberalized diet   Sacral decubitus ulcer  - Wound care consult  - Air mattress   Hx of uterine CA in remission   DVT LLE s/p IVC filter and not on A/C due to GIB         Code Status: DNR Family Communication: patient Disposition Plan:    Consultants:  neuro  Procedures:    Antibiotics:    HPI/Subjective: Wants to get back in bed  Objective: Filed Vitals:   06/12/13 0935  BP: 100/55  Pulse: 66  Temp: 99 F (37.2 C)  Resp: 20    Intake/Output Summary (Last 24 hours) at 06/12/13 1140 Last data filed at 06/11/13 2330  Gross per 24 hour  Intake    660 ml  Output    500 ml  Net    160 ml   Filed Weights   06/11/13 1900  Weight: 70.308 kg (155 lb)    Exam:   General:  Pleasant/cooperative  Cardiovascular: rrr  Respiratory: clear anterior  Abdomen: +BS, soft  Musculoskeletal: moves all 4 ext   Data Reviewed: Basic Metabolic Panel:  Recent Labs Lab 06/11/13 1150  NA 137  K 4.2  CL 106  CO2 20  GLUCOSE 94  BUN 21  CREATININE 0.96  CALCIUM 7.9*   Liver Function Tests:  Recent Labs Lab 06/11/13 1150  AST 20  ALT 15  ALKPHOS 75  BILITOT 0.6  PROT 3.8*  ALBUMIN 1.9*   No results found for this basename: LIPASE, AMYLASE,  in the last 168 hours No results found for  this basename: AMMONIA,  in the last 168 hours CBC:  Recent Labs Lab 06/11/13 1150  WBC 5.3  HGB 9.9*  HCT 29.4*  MCV 89.9  PLT 95*   Cardiac Enzymes:  Recent Labs Lab 06/11/13 1150 06/11/13 1800  CKTOTAL 26  --   TROPONINI <0.30 <0.30   BNP (last 3 results)  Recent Labs  12/08/12 0026 03/27/13 0120  PROBNP 465.5* 5592.0*   CBG:  Recent Labs Lab 06/11/13 1046  GLUCAP 83    No results found for this or any previous visit (from the past 240 hour(s)).   Studies: Dg Chest 2 View  06/11/2013   CLINICAL DATA:   Hypertension.  History of uterine cancer.  EXAM: CHEST  2 VIEW  COMPARISON:  Single view of the chest 03/25/2013.  FINDINGS: Port-A-Cath remains in place. There is cardiomegaly without edema. Lungs are clear. No pneumothorax or pleural effusion. No focal bony abnormality.  IMPRESSION: Cardiomegaly without acute disease.   Electronically Signed   By: Inge Rise M.D.   On: 06/11/2013 13:29    Scheduled Meds: . aspirin  81 mg Oral Daily  . atorvastatin  20 mg Oral Q supper  . buPROPion  150 mg Oral Q breakfast  . carvedilol  6.25 mg Oral BID WC  . feeding supplement (ENSURE COMPLETE)  237 mL Oral BID BM  . ferrous sulfate  325 mg Oral Q breakfast  . levothyroxine  125 mcg Oral QAC breakfast  . loperamide  2 mg Oral Daily  . loperamide  4 mg Oral QHS  . mycophenolate  1,000 mg Oral BID  . pantoprazole  40 mg Oral Daily  . potassium chloride  20 mEq Oral BID  . sodium chloride  3 mL Intravenous Q12H   Continuous Infusions:   Principal Problem:   Proximal muscle weakness Active Problems:   HYPOTHYROIDISM   ANXIETY   Anemia of chronic disease   Protein-calorie malnutrition, severe   Pedal edema   Cardiomyopathy   Weakness   Numbness of legs    Time spent: 35 min    VANN, JESSICA  [facility] Pager [phone]. If 7PM-7AM, please contact night-coverage at [email or web address], password [facility] 06/12/2013, 11:40 AM  LOS: 1 day

## 2013-06-13 DIAGNOSIS — R209 Unspecified disturbances of skin sensation: Secondary | ICD-10-CM

## 2013-06-13 LAB — BASIC METABOLIC PANEL
BUN: 24 mg/dL — AB (ref 6–23)
CHLORIDE: 111 meq/L (ref 96–112)
CO2: 21 meq/L (ref 19–32)
CREATININE: 1.05 mg/dL (ref 0.50–1.10)
Calcium: 7.4 mg/dL — ABNORMAL LOW (ref 8.4–10.5)
GFR calc non Af Amer: 46 mL/min — ABNORMAL LOW (ref >90)
GFR, EST AFRICAN AMERICAN: 54 mL/min — AB (ref >90)
GLUCOSE: 99 mg/dL (ref 70–99)
POTASSIUM: 4.6 meq/L (ref 3.7–5.3)
Sodium: 139 mEq/L (ref 137–147)

## 2013-06-13 LAB — CBC
HCT: 26.3 % — ABNORMAL LOW (ref 36.0–46.0)
Hemoglobin: 8.5 g/dL — ABNORMAL LOW (ref 12.0–15.0)
MCH: 30 pg (ref 26.0–34.0)
MCHC: 32.3 g/dL (ref 30.0–36.0)
MCV: 92.9 fL (ref 78.0–100.0)
Platelets: 67 10*3/uL — ABNORMAL LOW (ref 150–400)
RBC: 2.83 MIL/uL — ABNORMAL LOW (ref 3.87–5.11)
RDW: 15.8 % — ABNORMAL HIGH (ref 11.5–15.5)
WBC: 4.1 10*3/uL (ref 4.0–10.5)

## 2013-06-13 MED ORDER — GADOBENATE DIMEGLUMINE 529 MG/ML IV SOLN
15.0000 mL | Freq: Once | INTRAVENOUS | Status: AC
  Administered 2013-06-13: 15 mL via INTRAVENOUS

## 2013-06-13 NOTE — Progress Notes (Signed)
UR complete.  Keeva Reisen RN, MSN 

## 2013-06-13 NOTE — Progress Notes (Addendum)
MRI called to postpone lumbar MRI d/t pt having contrast last pm. MRI needs 48 hours in between scans with contrast.  MD notified.  Lumbar MRI without contrast ordered, MRI contacted.

## 2013-06-13 NOTE — Progress Notes (Signed)
Pt daughter called to inform RN that her mother had a fall the day of admission.  Pt for lumbar MRI today, MD paged.

## 2013-06-13 NOTE — Care Management Note (Unsigned)
    Page 1 of 1   06/13/2013     3:37:11 PM   CARE MANAGEMENT NOTE 06/13/2013  Patient:  MARTIN, SMEAL   Account Number:  [id number]  Date Initiated:  06/13/2013  Documentation initiated by:  Lorne Skeens  Subjective/Objective Assessment:   patient admitted with progressive weakness. Patient is active with [facility]     Action/Plan:   Will follow for discharge needs   Anticipated DC Date:     Anticipated DC Plan:           Choice offered to / List presented to:  C-1 Patient        [place] arranged  HH-1 RN  [place].   Status of service:   Medicare Important Message given?   (If response is "NO", the following Medicare IM given date fields will be blank) Date Medicare IM given:   Date Additional Medicare IM given:    Discharge Disposition:    Per UR Regulation:    If discussed at Long Length of Stay Meetings, dates discussed:    Comments:  06/13/13 [place], MSN, CM- Met with patient to discuss home health needs.  Patient was active with [facility] prior to this admission and would like to return to their care at discharge.  Mary with [facility] was notified of [facility] health orders.  CM will notify [facility] when patient is ready for discharge.

## 2013-06-13 NOTE — Progress Notes (Signed)
Subjective: No significant changes.   Exam: Filed Vitals:   06/13/13 1039  BP: 91/49  Pulse: 57  Temp:   Resp: 20   Gen: In bed, NAD MS: Awake, alert, oriented x 3  NO:BSJGG, eomi Motor: able to give good strength Sensory: decreased to LT/ absent vibration to the knees bilaterally. Vibration is absent to the knees, decreased to the elbows.  EZM:OQHUT in UE, difficult to ellicit ankle jerks. Knee reflex present bilaterally.    Impression: 80 yo F with worsening gait difficulty and LE numbness. Possibilities include myasthenia causing LE fatiguability, GBS, B12 causing posterior column dysfunciton/neuropathy. With her degree of decreased sensation, if due to GBS I would expect a more pronounced hyporeflexia. Would get MRI L-Spine, and if negative will need to conisder treating for presumed GBS. With platelets low, would feel uncomfortable with LP at this time.   Recommendations: 1) MRI L-spine 2) PT/OT 3) consider LP if platelets come up.   Roland Rack, MD [facility] [phone]  If 7pm- 7am, please page neurology on call at [phone].

## 2013-06-13 NOTE — Progress Notes (Signed)
PT Cancellation Note  Patient Details Name: [name] MRN: [id number] DOB: 1925-01-10   Cancelled Treatment:    Reason Eval/Treat Not Completed: Fatigue/lethargy limiting ability to participate. Patient declining PT at this time stating that she didn't sleep well last night and she doesn't feel well. Will follow up as able    Robinette, Tonia Brooms 06/13/2013, 10:57 AM

## 2013-06-13 NOTE — Progress Notes (Signed)
[facility] PROGRESS NOTE  [name] [id number] DOB: 1925/05/16 DOA: 06/11/2013 PCP: Laurey Morale, MD  Assessment/Plan: Progressive weakness and lower extremity numbness. DDx includes occult infection, GBS/transverse myelitis, myasthenia flare, endocrinopathy such as hypothyroidism or adrenal insufficiency. GBS/transverse myelitis possible. No obvious infections suggested by history, UA, or CXR. These symptoms are not consistent with her previous myasthenia flare and that would not explain her numbness. She does not appear significantly dehydrated on exam or by labs. Has only moderate thrombocytopenia and hgb stable so TTP less likely.  - TSH ok and cortisol level ok - Continue to monitor for symptoms of underlying infection  - q4h neuro checks  - Neurology consultation - MRI done- ordered for lumbar spine as well- cannot do until tomm- neurology considering LP once plts are increased to a safe level - PT/OT - home health - Gentle hydration overnight  - Air mattress   Myasthenia Gravis with progressive weakness  - Continue cellcept  - Neurology consultation  - Monitor for evidence of respiratory distress  -incentive spirometry  PAF, not on A/C secondary to GIB, currently NSR   HTN/HLD, BP stable, hold statin in acute setting and continue BB   ICM, weight stable leg swelling and weight loss. CXR without interstitial edema. Hold lasix  Lower extremity edema, stable and likely due to low albumin, although she also has hx of DVT (has IVC filter)  - TED hose, elevate when able   CKD stage II/III, stable. Minimize nephrotoxins and renally dose medications   Hypothyroidism, continue synthroid   Anxiety, stable. 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 Defer to PCP   Thrombocytopenia, new onset, likely acute phase reactant   Progressive weight loss likely  - Start prn  phenergan  - consider appetite stimulant once acute problem has been further investigated   Severe protein-calorie malnutrition with hypoalbuminemia and weight loss due to anorexia with nausea  - Nutrition consultation  - Dietary supplements  - Liberalized diet   Sacral decubitus ulcer  - Wound care consult  - Air mattress   Hx of uterine CA in remission   DVT LLE s/p IVC filter and not on A/C due to GIB         Code Status: DNR Family Communication: patient Disposition Plan:    Consultants:  neuro  Procedures:    Antibiotics:    HPI/Subjective: Feels weakness is improving  Objective: Filed Vitals:   06/13/13 1354  BP: 123/57  Pulse: 63  Temp: 98.2 F (36.8 C)  Resp: 20    Intake/Output Summary (Last 24 hours) at 06/13/13 1355 Last data filed at 06/13/13 1000  Gross per 24 hour  Intake    560 ml  Output      0 ml  Net    560 ml   Filed Weights   06/11/13 1900  Weight: 70.308 kg (155 lb)    Exam:   General:  Pleasant/cooperative  Cardiovascular: rrr  Respiratory: clear anterior  Abdomen: +BS, soft  Musculoskeletal: moves all 4 ext - weak LE  Data Reviewed: Basic Metabolic Panel:  Recent Labs Lab 06/11/13 1150 06/13/13 0550  NA 137 139  K 4.2 4.6  CL 106 111  CO2 20 21  GLUCOSE 94 99  BUN 21 24*  CREATININE 0.96 1.05  CALCIUM 7.9* 7.4*   Liver Function Tests:  Recent Labs Lab 06/11/13 1150  AST 20  ALT 15  ALKPHOS 75  BILITOT 0.6  PROT 3.8*  ALBUMIN 1.9*   No results found for this basename: LIPASE, AMYLASE,  in the last 168 hours No results found for this basename: AMMONIA,  in the last 168 hours CBC:  Recent Labs Lab 06/11/13 1150 06/13/13 0550  WBC 5.3 4.1  HGB 9.9* 8.5*  HCT 29.4* 26.3*  MCV 89.9 92.9  PLT 95* 67*   Cardiac Enzymes:  Recent Labs Lab 06/11/13 1150 06/11/13 1800  CKTOTAL 26  --   TROPONINI <0.30 <0.30   BNP (last 3 results)  Recent Labs  12/08/12 0026 03/27/13 0120   PROBNP 465.5* 5592.0*   CBG:  Recent Labs Lab 06/11/13 1046  GLUCAP 83    Recent Results (from the past 240 hour(s))  URINE CULTURE     Status: None   Collection Time    06/11/13 12:23 PM      Result Value Range Status   Specimen Description URINE, CLEAN CATCH   Final   Special Requests NONE   Final   Culture  Setup Time     Final   Value: 06/11/2013 16:38     Performed at [facility]     Final   Value: 30,000 COLONIES/ML     Performed at [facility]   Culture     Final   Value: Multiple bacterial morphotypes present, none predominant. Suggest appropriate recollection if clinically indicated.     Performed at [facility]   Report Status 06/12/2013 FINAL   Final     Studies: Mr Brain Wo Contrast  06/13/2013   CLINICAL DATA:  Progressive lower extremity weakness.  EXAM: MRI HEAD WITHOUT CONTRAST  MRI CERVICAL SPINE WITHOUT AND WITH CONTRAST  TECHNIQUE: Multiplanar, multiecho pulse sequences of the brain and surrounding structures, cervical spine, and thoracic spine to include the craniocervical junction and cervicothoracic junction, were obtained without and with intravenous contrast.  CONTRAST:  68mL MULTIHANCE GADOBENATE DIMEGLUMINE 529 MG/ML IV SOLN  COMPARISON:  Prior CT from 10/25/2007  FINDINGS: MRI HEAD FINDINGS  Susceptibility artifact is present at the right parietal scalp.  Diffuse prominence of the CSF containing spaces is compatible with advanced age related cerebral atrophy. Mild scattered and confluent T2/FLAIR hyperintensity seen within the periventricular and deep white matter and deep white matter, most compatible with chronic microvascular ischemic changes.  No mass lesion or midline shift. Ventricles are normal in size without evidence of hydrocephalus. No extra-axial fluid collection.  No diffusion-weighted signal abnormality is identified to suggest acute intracranial infarct. No intracranial hemorrhage identified. Normal  flow voids are seen within the intracranial vasculature.  Craniocervical junction is normal. Pituitary gland within normal limits. Globes and optic nerves are within normal limits.  T1 hyperintense subcentimeter lesion noted within the left parietal calvarium, which may represent focal fatty marrow (series 11, image 200). This finding is of doubtful clinical significance. Otherwise, the Calvarium demonstrates a normal appearance with normal signal intensity. Degenerative changes noted within the upper spine.  MRI CERVICAL SPINE FINDINGS  The craniocervical junction is widely patent.  There is slight reversal of the normal cervical lordosis with apex at C5-6. Trace anterolisthesis of C3 on C4 is present.  Signal intensity within the vertebral body bone marrow is somewhat patchy and heterogeneous in appearance, which may be related to underlying osteoporosis. T1/T2 hyperintense lesion within the T1 vertebral body most likely reflects a benign hemangioma. Signal intensity within the cervical spinal cord is normal. No abnormal signal  intensity seen within the dorsal columns to suggest subacute combined degeneration. No abnormal enhancement seen on post-contrast sequences.  At C2-3, there is severe right with moderate left facet arthrosis and broad-based diffuse degenerative disc bulge. No significant canal or neural foraminal stenosis.  At C3-4, there is severe right with mild left facet arthrosis with mild broad-based degenerative disc osteophyte. No significant canal or neural foraminal stenosis.  At C4-5, there is broad-based degenerative disc osteophyte with right greater than left uncovertebral osteophytosis and facet arthrosis. Left paracentral disc osteophyte flattens the left ventral thecal sac and abuts the ventral spinal cord. No significant canal or neural foraminal stenosis identified.  At C5-6, there is severe degenerative intervertebral disc space narrowing with broad-based degenerative disc osteophyte and  bilateral facet arthrosis. Posterior disc osteophyte flattens and largely effaces the ventral thecal sac, abutting the cervical spinal cord. No significant canal stenosis or foraminal narrowing identified.  At C6-7, there is broad-based degenerative disc osteophyte with prominent bilateral facet arthrosis and uncovertebral osteophytosis. No significant canal stenosis identified. Neural foramina are widely patent.  At C7-T1, there is mild broad-based degenerative disc osteophyte with bilateral facet arthrosis. Resultant mild bilateral neural foraminal narrowing is present without central canal stenosis.  Visualized soft tissues of the neck are within normal limits. Normal flow voids are seen within the vertebral arteries.  MRI THORACIC SPINE FINDINGS:  The vertebral bodies are normally aligned with preservation of the normal thoracic kyphosis. Vertebral body heights are preserved. No acute fracture or or listhesis.  Signal intensity within the vertebral body bone marrow is somewhat patchy and heterogeneous in appearance, which may be related to osteoporosis. No discrete osseous lesions.  Signal intensity within the thoracic spinal cord is within normal limits. Dorsal columns demonstrate a normal appearance with normal signal intensity. No abnormal enhancement seen on post-contrast sequences. The nerve roots of the cauda equina are within normal limits. The conus terminates at the L1 level.  Multilevel degenerative disc bulging is seen within the lower thoracic spine, most prevalent at T10-11, T11-12 and T12-L1. Mild to moderate bilateral facet hypertrophy is present at these levels as well. No definite focal disc protrusion identified within the thoracic spine. Degenerative disc bulging is also noted distally at the L1-2 and L2-3 levels on sagittal sequences.  Small bilateral pleural effusions with associated bibasilar atelectasis is present. Visualized paraspinous soft tissues are within normal limits.  IMPRESSION:  MRI HEAD:  1. No acute intracranial infarct or other abnormality identified. 2. Advanced age-related atrophy with mild chronic microvascular ischemic changes.  MRI CERVICAL SPINE:  1. Normal appearance of the cervical spinal cord without signal abnormality to suggest subacute combined degeneration or other abnormality. No abnormal enhancement. 2. Extensive multilevel degenerative disc disease throughout the cervical spine as detailed above without significant canal or neural foraminal stenosis  MRI THORACIC SPINE:  1. Normal appearance of the thoracic spinal cord without signal abnormality to suggest subacute combined degeneration or other abnormality. No abnormal enhancement. 2. Degenerative disc bulging and facet arthropathy at the T10 through L1 levels without significant canal or neural foraminal stenosis. 3. Small to moderate bilateral pleural effusions with associated bibasilar atelectasis.   Electronically Signed   By: Jeannine Boga M.D.   On: 06/13/2013 04:16   Mr Cervical Spine W Wo Contrast  06/13/2013   CLINICAL DATA:  Progressive lower extremity weakness.  EXAM: MRI HEAD WITHOUT CONTRAST  MRI CERVICAL SPINE WITHOUT AND WITH CONTRAST  TECHNIQUE: Multiplanar, multiecho pulse sequences of the brain and surrounding structures,  cervical spine, and thoracic spine to include the craniocervical junction and cervicothoracic junction, were obtained without and with intravenous contrast.  CONTRAST:  67mL MULTIHANCE GADOBENATE DIMEGLUMINE 529 MG/ML IV SOLN  COMPARISON:  Prior CT from 10/25/2007  FINDINGS: MRI HEAD FINDINGS  Susceptibility artifact is present at the right parietal scalp.  Diffuse prominence of the CSF containing spaces is compatible with advanced age related cerebral atrophy. Mild scattered and confluent T2/FLAIR hyperintensity seen within the periventricular and deep white matter and deep white matter, most compatible with chronic microvascular ischemic changes.  No mass lesion or midline  shift. Ventricles are normal in size without evidence of hydrocephalus. No extra-axial fluid collection.  No diffusion-weighted signal abnormality is identified to suggest acute intracranial infarct. No intracranial hemorrhage identified. Normal flow voids are seen within the intracranial vasculature.  Craniocervical junction is normal. Pituitary gland within normal limits. Globes and optic nerves are within normal limits.  T1 hyperintense subcentimeter lesion noted within the left parietal calvarium, which may represent focal fatty marrow (series 11, image 200). This finding is of doubtful clinical significance. Otherwise, the Calvarium demonstrates a normal appearance with normal signal intensity. Degenerative changes noted within the upper spine.  MRI CERVICAL SPINE FINDINGS  The craniocervical junction is widely patent.  There is slight reversal of the normal cervical lordosis with apex at C5-6. Trace anterolisthesis of C3 on C4 is present.  Signal intensity within the vertebral body bone marrow is somewhat patchy and heterogeneous in appearance, which may be related to underlying osteoporosis. T1/T2 hyperintense lesion within the T1 vertebral body most likely reflects a benign hemangioma. Signal intensity within the cervical spinal cord is normal. No abnormal signal intensity seen within the dorsal columns to suggest subacute combined degeneration. No abnormal enhancement seen on post-contrast sequences.  At C2-3, there is severe right with moderate left facet arthrosis and broad-based diffuse degenerative disc bulge. No significant canal or neural foraminal stenosis.  At C3-4, there is severe right with mild left facet arthrosis with mild broad-based degenerative disc osteophyte. No significant canal or neural foraminal stenosis.  At C4-5, there is broad-based degenerative disc osteophyte with right greater than left uncovertebral osteophytosis and facet arthrosis. Left paracentral disc osteophyte flattens the  left ventral thecal sac and abuts the ventral spinal cord. No significant canal or neural foraminal stenosis identified.  At C5-6, there is severe degenerative intervertebral disc space narrowing with broad-based degenerative disc osteophyte and bilateral facet arthrosis. Posterior disc osteophyte flattens and largely effaces the ventral thecal sac, abutting the cervical spinal cord. No significant canal stenosis or foraminal narrowing identified.  At C6-7, there is broad-based degenerative disc osteophyte with prominent bilateral facet arthrosis and uncovertebral osteophytosis. No significant canal stenosis identified. Neural foramina are widely patent.  At C7-T1, there is mild broad-based degenerative disc osteophyte with bilateral facet arthrosis. Resultant mild bilateral neural foraminal narrowing is present without central canal stenosis.  Visualized soft tissues of the neck are within normal limits. Normal flow voids are seen within the vertebral arteries.  MRI THORACIC SPINE FINDINGS:  The vertebral bodies are normally aligned with preservation of the normal thoracic kyphosis. Vertebral body heights are preserved. No acute fracture or or listhesis.  Signal intensity within the vertebral body bone marrow is somewhat patchy and heterogeneous in appearance, which may be related to osteoporosis. No discrete osseous lesions.  Signal intensity within the thoracic spinal cord is within normal limits. Dorsal columns demonstrate a normal appearance with normal signal intensity. No abnormal enhancement seen on post-contrast  sequences. The nerve roots of the cauda equina are within normal limits. The conus terminates at the L1 level.  Multilevel degenerative disc bulging is seen within the lower thoracic spine, most prevalent at T10-11, T11-12 and T12-L1. Mild to moderate bilateral facet hypertrophy is present at these levels as well. No definite focal disc protrusion identified within the thoracic spine. Degenerative  disc bulging is also noted distally at the L1-2 and L2-3 levels on sagittal sequences.  Small bilateral pleural effusions with associated bibasilar atelectasis is present. Visualized paraspinous soft tissues are within normal limits.  IMPRESSION: MRI HEAD:  1. No acute intracranial infarct or other abnormality identified. 2. Advanced age-related atrophy with mild chronic microvascular ischemic changes.  MRI CERVICAL SPINE:  1. Normal appearance of the cervical spinal cord without signal abnormality to suggest subacute combined degeneration or other abnormality. No abnormal enhancement. 2. Extensive multilevel degenerative disc disease throughout the cervical spine as detailed above without significant canal or neural foraminal stenosis  MRI THORACIC SPINE:  1. Normal appearance of the thoracic spinal cord without signal abnormality to suggest subacute combined degeneration or other abnormality. No abnormal enhancement. 2. Degenerative disc bulging and facet arthropathy at the T10 through L1 levels without significant canal or neural foraminal stenosis. 3. Small to moderate bilateral pleural effusions with associated bibasilar atelectasis.   Electronically Signed   By: Jeannine Boga M.D.   On: 06/13/2013 04:16   Mr Thoracic Spine W Wo Contrast  06/13/2013   CLINICAL DATA:  Progressive lower extremity weakness.  EXAM: MRI HEAD WITHOUT CONTRAST  MRI CERVICAL SPINE WITHOUT AND WITH CONTRAST  TECHNIQUE: Multiplanar, multiecho pulse sequences of the brain and surrounding structures, cervical spine, and thoracic spine to include the craniocervical junction and cervicothoracic junction, were obtained without and with intravenous contrast.  CONTRAST:  37mL MULTIHANCE GADOBENATE DIMEGLUMINE 529 MG/ML IV SOLN  COMPARISON:  Prior CT from 10/25/2007  FINDINGS: MRI HEAD FINDINGS  Susceptibility artifact is present at the right parietal scalp.  Diffuse prominence of the CSF containing spaces is compatible with advanced  age related cerebral atrophy. Mild scattered and confluent T2/FLAIR hyperintensity seen within the periventricular and deep white matter and deep white matter, most compatible with chronic microvascular ischemic changes.  No mass lesion or midline shift. Ventricles are normal in size without evidence of hydrocephalus. No extra-axial fluid collection.  No diffusion-weighted signal abnormality is identified to suggest acute intracranial infarct. No intracranial hemorrhage identified. Normal flow voids are seen within the intracranial vasculature.  Craniocervical junction is normal. Pituitary gland within normal limits. Globes and optic nerves are within normal limits.  T1 hyperintense subcentimeter lesion noted within the left parietal calvarium, which may represent focal fatty marrow (series 11, image 200). This finding is of doubtful clinical significance. Otherwise, the Calvarium demonstrates a normal appearance with normal signal intensity. Degenerative changes noted within the upper spine.  MRI CERVICAL SPINE FINDINGS  The craniocervical junction is widely patent.  There is slight reversal of the normal cervical lordosis with apex at C5-6. Trace anterolisthesis of C3 on C4 is present.  Signal intensity within the vertebral body bone marrow is somewhat patchy and heterogeneous in appearance, which may be related to underlying osteoporosis. T1/T2 hyperintense lesion within the T1 vertebral body most likely reflects a benign hemangioma. Signal intensity within the cervical spinal cord is normal. No abnormal signal intensity seen within the dorsal columns to suggest subacute combined degeneration. No abnormal enhancement seen on post-contrast sequences.  At C2-3, there is severe right with  moderate left facet arthrosis and broad-based diffuse degenerative disc bulge. No significant canal or neural foraminal stenosis.  At C3-4, there is severe right with mild left facet arthrosis with mild broad-based degenerative  disc osteophyte. No significant canal or neural foraminal stenosis.  At C4-5, there is broad-based degenerative disc osteophyte with right greater than left uncovertebral osteophytosis and facet arthrosis. Left paracentral disc osteophyte flattens the left ventral thecal sac and abuts the ventral spinal cord. No significant canal or neural foraminal stenosis identified.  At C5-6, there is severe degenerative intervertebral disc space narrowing with broad-based degenerative disc osteophyte and bilateral facet arthrosis. Posterior disc osteophyte flattens and largely effaces the ventral thecal sac, abutting the cervical spinal cord. No significant canal stenosis or foraminal narrowing identified.  At C6-7, there is broad-based degenerative disc osteophyte with prominent bilateral facet arthrosis and uncovertebral osteophytosis. No significant canal stenosis identified. Neural foramina are widely patent.  At C7-T1, there is mild broad-based degenerative disc osteophyte with bilateral facet arthrosis. Resultant mild bilateral neural foraminal narrowing is present without central canal stenosis.  Visualized soft tissues of the neck are within normal limits. Normal flow voids are seen within the vertebral arteries.  MRI THORACIC SPINE FINDINGS:  The vertebral bodies are normally aligned with preservation of the normal thoracic kyphosis. Vertebral body heights are preserved. No acute fracture or or listhesis.  Signal intensity within the vertebral body bone marrow is somewhat patchy and heterogeneous in appearance, which may be related to osteoporosis. No discrete osseous lesions.  Signal intensity within the thoracic spinal cord is within normal limits. Dorsal columns demonstrate a normal appearance with normal signal intensity. No abnormal enhancement seen on post-contrast sequences. The nerve roots of the cauda equina are within normal limits. The conus terminates at the L1 level.  Multilevel degenerative disc bulging  is seen within the lower thoracic spine, most prevalent at T10-11, T11-12 and T12-L1. Mild to moderate bilateral facet hypertrophy is present at these levels as well. No definite focal disc protrusion identified within the thoracic spine. Degenerative disc bulging is also noted distally at the L1-2 and L2-3 levels on sagittal sequences.  Small bilateral pleural effusions with associated bibasilar atelectasis is present. Visualized paraspinous soft tissues are within normal limits.  IMPRESSION: MRI HEAD:  1. No acute intracranial infarct or other abnormality identified. 2. Advanced age-related atrophy with mild chronic microvascular ischemic changes.  MRI CERVICAL SPINE:  1. Normal appearance of the cervical spinal cord without signal abnormality to suggest subacute combined degeneration or other abnormality. No abnormal enhancement. 2. Extensive multilevel degenerative disc disease throughout the cervical spine as detailed above without significant canal or neural foraminal stenosis  MRI THORACIC SPINE:  1. Normal appearance of the thoracic spinal cord without signal abnormality to suggest subacute combined degeneration or other abnormality. No abnormal enhancement. 2. Degenerative disc bulging and facet arthropathy at the T10 through L1 levels without significant canal or neural foraminal stenosis. 3. Small to moderate bilateral pleural effusions with associated bibasilar atelectasis.   Electronically Signed   By: Jeannine Boga M.D.   On: 06/13/2013 04:16    Scheduled Meds: . aspirin  81 mg Oral Daily  . atorvastatin  20 mg Oral Q supper  . buPROPion  150 mg Oral Q breakfast  . carvedilol  6.25 mg Oral BID WC  . feeding supplement (ENSURE COMPLETE)  237 mL Oral BID BM  . ferrous sulfate  325 mg Oral Q breakfast  . levothyroxine  125 mcg Oral QAC breakfast  .  loperamide  2 mg Oral Daily  . loperamide  4 mg Oral QHS  . multivitamin with minerals  1 tablet Oral Daily  . mycophenolate  1,000 mg  Oral BID  . nutrition supplement (JUVEN)  1 packet Oral BID BM  . pantoprazole  40 mg Oral Daily  . potassium chloride  20 mEq Oral BID  . sodium chloride  3 mL Intravenous Q12H   Continuous Infusions:   Principal Problem:   Proximal muscle weakness Active Problems:   HYPOTHYROIDISM   ANXIETY   Anemia of chronic disease   Protein-calorie malnutrition, severe   Pedal edema   Cardiomyopathy   Weakness   Numbness of legs    Time spent: 35 min    Ulla Mckiernan  [facility] Pager [phone]. If 7PM-7AM, please contact night-coverage at [email or web address], password [facility] 06/13/2013, 1:55 PM  LOS: 2 days

## 2013-06-13 NOTE — Progress Notes (Signed)
OT Cancellation Note  Patient Details Name: Shaya Reddick MRN: [id number] DOB: [birth year]   Cancelled Treatment:    Reason Eval/Treat Not Completed: Pain limiting ability to participate and pt also awaiting an MRI of back.   Betsy Pries 06/13/2013, 11:21 AM

## 2013-06-14 ENCOUNTER — Inpatient Hospital Stay (HOSPITAL_COMMUNITY): Payer: Medicare Other

## 2013-06-14 DIAGNOSIS — E039 Hypothyroidism, unspecified: Secondary | ICD-10-CM

## 2013-06-14 DIAGNOSIS — D638 Anemia in other chronic diseases classified elsewhere: Secondary | ICD-10-CM

## 2013-06-14 LAB — BASIC METABOLIC PANEL
BUN: 21 mg/dL (ref 6–23)
CO2: 21 mEq/L (ref 19–32)
Calcium: 7.3 mg/dL — ABNORMAL LOW (ref 8.4–10.5)
Chloride: 112 mEq/L (ref 96–112)
Creatinine, Ser: 0.89 mg/dL (ref 0.50–1.10)
GFR calc Af Amer: 66 mL/min — ABNORMAL LOW (ref >90)
GFR calc non Af Amer: 57 mL/min — ABNORMAL LOW (ref >90)
GLUCOSE: 86 mg/dL (ref 70–99)
POTASSIUM: 4.2 meq/L (ref 3.7–5.3)
Sodium: 140 mEq/L (ref 137–147)

## 2013-06-14 LAB — CSF CELL COUNT WITH DIFFERENTIAL
RBC Count, CSF: 1 /mm3 — ABNORMAL HIGH
Tube #: 1
WBC, CSF: 1 /mm3 (ref 0–5)

## 2013-06-14 LAB — CBC
HCT: 26.5 % — ABNORMAL LOW (ref 36.0–46.0)
HEMOGLOBIN: 8.9 g/dL — AB (ref 12.0–15.0)
MCH: 30.9 pg (ref 26.0–34.0)
MCHC: 33.6 g/dL (ref 30.0–36.0)
MCV: 92 fL (ref 78.0–100.0)
Platelets: 67 10*3/uL — ABNORMAL LOW (ref 150–400)
RBC: 2.88 MIL/uL — ABNORMAL LOW (ref 3.87–5.11)
RDW: 15.9 % — AB (ref 11.5–15.5)
WBC: 3.4 10*3/uL — ABNORMAL LOW (ref 4.0–10.5)

## 2013-06-14 LAB — PROTEIN AND GLUCOSE, CSF
GLUCOSE CSF: 45 mg/dL (ref 43–76)
Total  Protein, CSF: 28 mg/dL (ref 15–45)

## 2013-06-14 MED ORDER — HYDROCODONE-ACETAMINOPHEN 5-325 MG PO TABS
1.0000 | ORAL_TABLET | Freq: Four times a day (QID) | ORAL | Status: DC | PRN
  Administered 2013-06-14 – 2013-06-18 (×8): 1 via ORAL
  Filled 2013-06-14 (×9): qty 1

## 2013-06-14 MED ORDER — IMMUNE GLOBULIN (HUMAN) 10 GM/200ML IV SOLN
400.0000 mg/kg | INTRAVENOUS | Status: AC
  Administered 2013-06-14 – 2013-06-18 (×5): 30 g via INTRAVENOUS
  Filled 2013-06-14 (×7): qty 600

## 2013-06-14 NOTE — Procedures (Signed)
Lumbar puncture performed under fluoroscopy without complication. Full description of procedure in radiology report.

## 2013-06-14 NOTE — Progress Notes (Signed)
PT Cancellation Note  Patient Details Name: [name] MRN: [id number] DOB: [birth year]   Cancelled Treatment:    Reason Eval/Treat Not Completed: Patient at procedure or test/unavailable. Patient at MRI, Will follow up as able this afternoon   Mane Consolo, Tonia Brooms 06/14/2013, 11:45 AM

## 2013-06-14 NOTE — Progress Notes (Signed)
PT Cancellation Note  Patient Details Name: [name] MRN: [id number] DOB: 1924-07-18   Cancelled Treatment:    Reason Eval/Treat Not Completed: Medical issues which prohibited therapy . Patient on bedrest until 4:45pm. Will plan to follow up with patient on Saturday.    Jacqualyn Posey 06/14/2013, 1:17 PM

## 2013-06-14 NOTE — Progress Notes (Signed)
[facility] PROGRESS NOTE  [name] [id number] DOB: 11-27-1924 DOA: 06/11/2013 PCP: Laurey Morale, MD  Assessment/Plan: Progressive weakness and lower extremity numbness. DDx includes occult infection, GBS/transverse myelitis, myasthenia flare, endocrinopathy such as hypothyroidism or adrenal insufficiency. GBS/transverse myelitis possible. No obvious infections suggested by history, UA, or CXR. These symptoms are not consistent with her previous myasthenia flare and that would not explain her numbness. She does not appear significantly dehydrated on exam or by labs. Has only moderate thrombocytopenia and hgb stable so TTP less likely.  - TSH ok and cortisol level ok - Continue to monitor for symptoms of underlying infection  - q4h neuro checks  - Neurology consultation - MRI done- ordered for lumbar spine as well-   LP 1/23 - PT/OT - home health - Air mattress   Myasthenia Gravis with progressive weakness  - Continue cellcept  - Neurology consultation  - Monitor for evidence of respiratory distress  -incentive spirometry  PAF, not on A/C secondary to GIB, currently NSR   HTN/HLD, BP stable, hold statin in acute setting and continue BB   ICM, weight stable leg swelling and weight loss. CXR without interstitial edema. Hold lasix  Lower extremity edema, stable and likely due to low albumin, although she also has hx of DVT (has IVC filter)  - TED hose, elevate when able   CKD stage II/III, stable. Minimize nephrotoxins and renally dose medications   Hypothyroidism, continue synthroid   Anxiety, stable. 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 Defer to PCP   Thrombocytopenia, new onset, likely acute phase reactant   Progressive weight loss likely  - Start prn phenergan  - consider appetite stimulant once acute problem has been further investigated   Severe  protein-calorie malnutrition with hypoalbuminemia and weight loss due to anorexia with nausea  - Nutrition consultation  - Dietary supplements  - Liberalized diet   Sacral decubitus ulcer  - Wound care consult  - Air mattress   Hx of uterine CA in remission   DVT LLE s/p IVC filter and not on A/C due to GIB         Code Status: DNR Family Communication: patient Disposition Plan:    Consultants:  neuro  Procedures:    Antibiotics:    HPI/Subjective: C/o some pain No SOB, no CP  Objective: Filed Vitals:   06/14/13 0939  BP: 93/52  Pulse: 60  Temp: 98.7 F (37.1 C)  Resp: 20    Intake/Output Summary (Last 24 hours) at 06/14/13 1204 Last data filed at 06/14/13 0900  Gross per 24 hour  Intake    240 ml  Output      0 ml  Net    240 ml   Filed Weights   06/11/13 1900  Weight: 70.308 kg (155 lb)    Exam:   General:  Pleasant/cooperative  Cardiovascular: rrr  Respiratory: clear anterior  Abdomen: +BS, soft  Musculoskeletal: moves all 4 ext - weak LE  Data Reviewed: Basic Metabolic Panel:  Recent Labs Lab 06/11/13 1150 06/13/13 0550 06/14/13 0450  NA 137 139 140  K 4.2 4.6 4.2  CL 106 111 112  CO2 20 21 21   GLUCOSE 94 99 86  BUN 21 24* 21  CREATININE 0.96 1.05 0.89  CALCIUM 7.9* 7.4* 7.3*   Liver Function Tests:  Recent Labs Lab 06/11/13 1150  AST 20  ALT 15  ALKPHOS 75  BILITOT 0.6  PROT 3.8*  ALBUMIN 1.9*   No results found for this basename: LIPASE, AMYLASE,  in the last 168 hours No results found for this basename: AMMONIA,  in the last 168 hours CBC:  Recent Labs Lab 06/11/13 1150 06/13/13 0550 06/14/13 0450  WBC 5.3 4.1 3.4*  HGB 9.9* 8.5* 8.9*  HCT 29.4* 26.3* 26.5*  MCV 89.9 92.9 92.0  PLT 95* 67* 67*   Cardiac Enzymes:  Recent Labs Lab 06/11/13 1150 06/11/13 1800  CKTOTAL 26  --   TROPONINI <0.30 <0.30   BNP (last 3 results)  Recent Labs  12/08/12 0026 03/27/13 0120  PROBNP 465.5*  5592.0*   CBG:  Recent Labs Lab 06/11/13 1046  GLUCAP 83    Recent Results (from the past 240 hour(s))  URINE CULTURE     Status: None   Collection Time    06/11/13 12:23 PM      Result Value Range Status   Specimen Description URINE, CLEAN CATCH   Final   Special Requests NONE   Final   Culture  Setup Time     Final   Value: 06/11/2013 16:38     Performed at [facility]     Final   Value: 30,000 COLONIES/ML     Performed at [facility]   Culture     Final   Value: Multiple bacterial morphotypes present, none predominant. Suggest appropriate recollection if clinically indicated.     Performed at [facility]   Report Status 06/12/2013 FINAL   Final     Studies: Mr Brain Wo Contrast  06/13/2013   CLINICAL DATA:  Progressive lower extremity weakness.  EXAM: MRI HEAD WITHOUT CONTRAST  MRI CERVICAL SPINE WITHOUT AND WITH CONTRAST  TECHNIQUE: Multiplanar, multiecho pulse sequences of the brain and surrounding structures, cervical spine, and thoracic spine to include the craniocervical junction and cervicothoracic junction, were obtained without and with intravenous contrast.  CONTRAST:  86mL MULTIHANCE GADOBENATE DIMEGLUMINE 529 MG/ML IV SOLN  COMPARISON:  Prior CT from 10/25/2007  FINDINGS: MRI HEAD FINDINGS  Susceptibility artifact is present at the right parietal scalp.  Diffuse prominence of the CSF containing spaces is compatible with advanced age related cerebral atrophy. Mild scattered and confluent T2/FLAIR hyperintensity seen within the periventricular and deep white matter and deep white matter, most compatible with chronic microvascular ischemic changes.  No mass lesion or midline shift. Ventricles are normal in size without evidence of hydrocephalus. No extra-axial fluid collection.  No diffusion-weighted signal abnormality is identified to suggest acute intracranial infarct. No intracranial hemorrhage identified. Normal flow voids are  seen within the intracranial vasculature.  Craniocervical junction is normal. Pituitary gland within normal limits. Globes and optic nerves are within normal limits.  T1 hyperintense subcentimeter lesion noted within the left parietal calvarium, which may represent focal fatty marrow (series 11, image 200). This finding is of doubtful clinical significance. Otherwise, the Calvarium demonstrates a normal appearance with normal signal intensity. Degenerative changes noted within the upper spine.  MRI CERVICAL SPINE FINDINGS  The craniocervical junction is widely patent.  There is slight reversal of the normal cervical lordosis with apex at C5-6. Trace anterolisthesis of C3 on C4 is present.  Signal intensity within the vertebral body bone marrow is somewhat patchy and heterogeneous in appearance, which may be related to underlying osteoporosis. T1/T2 hyperintense lesion within the T1 vertebral body most likely reflects a benign hemangioma. Signal intensity within the cervical spinal cord is  normal. No abnormal signal intensity seen within the dorsal columns to suggest subacute combined degeneration. No abnormal enhancement seen on post-contrast sequences.  At C2-3, there is severe right with moderate left facet arthrosis and broad-based diffuse degenerative disc bulge. No significant canal or neural foraminal stenosis.  At C3-4, there is severe right with mild left facet arthrosis with mild broad-based degenerative disc osteophyte. No significant canal or neural foraminal stenosis.  At C4-5, there is broad-based degenerative disc osteophyte with right greater than left uncovertebral osteophytosis and facet arthrosis. Left paracentral disc osteophyte flattens the left ventral thecal sac and abuts the ventral spinal cord. No significant canal or neural foraminal stenosis identified.  At C5-6, there is severe degenerative intervertebral disc space narrowing with broad-based degenerative disc osteophyte and bilateral  facet arthrosis. Posterior disc osteophyte flattens and largely effaces the ventral thecal sac, abutting the cervical spinal cord. No significant canal stenosis or foraminal narrowing identified.  At C6-7, there is broad-based degenerative disc osteophyte with prominent bilateral facet arthrosis and uncovertebral osteophytosis. No significant canal stenosis identified. Neural foramina are widely patent.  At C7-T1, there is mild broad-based degenerative disc osteophyte with bilateral facet arthrosis. Resultant mild bilateral neural foraminal narrowing is present without central canal stenosis.  Visualized soft tissues of the neck are within normal limits. Normal flow voids are seen within the vertebral arteries.  MRI THORACIC SPINE FINDINGS:  The vertebral bodies are normally aligned with preservation of the normal thoracic kyphosis. Vertebral body heights are preserved. No acute fracture or or listhesis.  Signal intensity within the vertebral body bone marrow is somewhat patchy and heterogeneous in appearance, which may be related to osteoporosis. No discrete osseous lesions.  Signal intensity within the thoracic spinal cord is within normal limits. Dorsal columns demonstrate a normal appearance with normal signal intensity. No abnormal enhancement seen on post-contrast sequences. The nerve roots of the cauda equina are within normal limits. The conus terminates at the L1 level.  Multilevel degenerative disc bulging is seen within the lower thoracic spine, most prevalent at T10-11, T11-12 and T12-L1. Mild to moderate bilateral facet hypertrophy is present at these levels as well. No definite focal disc protrusion identified within the thoracic spine. Degenerative disc bulging is also noted distally at the L1-2 and L2-3 levels on sagittal sequences.  Small bilateral pleural effusions with associated bibasilar atelectasis is present. Visualized paraspinous soft tissues are within normal limits.  IMPRESSION: MRI HEAD:   1. No acute intracranial infarct or other abnormality identified. 2. Advanced age-related atrophy with mild chronic microvascular ischemic changes.  MRI CERVICAL SPINE:  1. Normal appearance of the cervical spinal cord without signal abnormality to suggest subacute combined degeneration or other abnormality. No abnormal enhancement. 2. Extensive multilevel degenerative disc disease throughout the cervical spine as detailed above without significant canal or neural foraminal stenosis  MRI THORACIC SPINE:  1. Normal appearance of the thoracic spinal cord without signal abnormality to suggest subacute combined degeneration or other abnormality. No abnormal enhancement. 2. Degenerative disc bulging and facet arthropathy at the T10 through L1 levels without significant canal or neural foraminal stenosis. 3. Small to moderate bilateral pleural effusions with associated bibasilar atelectasis.   Electronically Signed   By: Jeannine Boga M.D.   On: 06/13/2013 04:16   Mr Cervical Spine W Wo Contrast  06/13/2013   CLINICAL DATA:  Progressive lower extremity weakness.  EXAM: MRI HEAD WITHOUT CONTRAST  MRI CERVICAL SPINE WITHOUT AND WITH CONTRAST  TECHNIQUE: Multiplanar, multiecho pulse sequences of the  brain and surrounding structures, cervical spine, and thoracic spine to include the craniocervical junction and cervicothoracic junction, were obtained without and with intravenous contrast.  CONTRAST:  58mL MULTIHANCE GADOBENATE DIMEGLUMINE 529 MG/ML IV SOLN  COMPARISON:  Prior CT from 10/25/2007  FINDINGS: MRI HEAD FINDINGS  Susceptibility artifact is present at the right parietal scalp.  Diffuse prominence of the CSF containing spaces is compatible with advanced age related cerebral atrophy. Mild scattered and confluent T2/FLAIR hyperintensity seen within the periventricular and deep white matter and deep white matter, most compatible with chronic microvascular ischemic changes.  No mass lesion or midline shift.  Ventricles are normal in size without evidence of hydrocephalus. No extra-axial fluid collection.  No diffusion-weighted signal abnormality is identified to suggest acute intracranial infarct. No intracranial hemorrhage identified. Normal flow voids are seen within the intracranial vasculature.  Craniocervical junction is normal. Pituitary gland within normal limits. Globes and optic nerves are within normal limits.  T1 hyperintense subcentimeter lesion noted within the left parietal calvarium, which may represent focal fatty marrow (series 11, image 200). This finding is of doubtful clinical significance. Otherwise, the Calvarium demonstrates a normal appearance with normal signal intensity. Degenerative changes noted within the upper spine.  MRI CERVICAL SPINE FINDINGS  The craniocervical junction is widely patent.  There is slight reversal of the normal cervical lordosis with apex at C5-6. Trace anterolisthesis of C3 on C4 is present.  Signal intensity within the vertebral body bone marrow is somewhat patchy and heterogeneous in appearance, which may be related to underlying osteoporosis. T1/T2 hyperintense lesion within the T1 vertebral body most likely reflects a benign hemangioma. Signal intensity within the cervical spinal cord is normal. No abnormal signal intensity seen within the dorsal columns to suggest subacute combined degeneration. No abnormal enhancement seen on post-contrast sequences.  At C2-3, there is severe right with moderate left facet arthrosis and broad-based diffuse degenerative disc bulge. No significant canal or neural foraminal stenosis.  At C3-4, there is severe right with mild left facet arthrosis with mild broad-based degenerative disc osteophyte. No significant canal or neural foraminal stenosis.  At C4-5, there is broad-based degenerative disc osteophyte with right greater than left uncovertebral osteophytosis and facet arthrosis. Left paracentral disc osteophyte flattens the left  ventral thecal sac and abuts the ventral spinal cord. No significant canal or neural foraminal stenosis identified.  At C5-6, there is severe degenerative intervertebral disc space narrowing with broad-based degenerative disc osteophyte and bilateral facet arthrosis. Posterior disc osteophyte flattens and largely effaces the ventral thecal sac, abutting the cervical spinal cord. No significant canal stenosis or foraminal narrowing identified.  At C6-7, there is broad-based degenerative disc osteophyte with prominent bilateral facet arthrosis and uncovertebral osteophytosis. No significant canal stenosis identified. Neural foramina are widely patent.  At C7-T1, there is mild broad-based degenerative disc osteophyte with bilateral facet arthrosis. Resultant mild bilateral neural foraminal narrowing is present without central canal stenosis.  Visualized soft tissues of the neck are within normal limits. Normal flow voids are seen within the vertebral arteries.  MRI THORACIC SPINE FINDINGS:  The vertebral bodies are normally aligned with preservation of the normal thoracic kyphosis. Vertebral body heights are preserved. No acute fracture or or listhesis.  Signal intensity within the vertebral body bone marrow is somewhat patchy and heterogeneous in appearance, which may be related to osteoporosis. No discrete osseous lesions.  Signal intensity within the thoracic spinal cord is within normal limits. Dorsal columns demonstrate a normal appearance with normal signal intensity. No abnormal  enhancement seen on post-contrast sequences. The nerve roots of the cauda equina are within normal limits. The conus terminates at the L1 level.  Multilevel degenerative disc bulging is seen within the lower thoracic spine, most prevalent at T10-11, T11-12 and T12-L1. Mild to moderate bilateral facet hypertrophy is present at these levels as well. No definite focal disc protrusion identified within the thoracic spine. Degenerative disc  bulging is also noted distally at the L1-2 and L2-3 levels on sagittal sequences.  Small bilateral pleural effusions with associated bibasilar atelectasis is present. Visualized paraspinous soft tissues are within normal limits.  IMPRESSION: MRI HEAD:  1. No acute intracranial infarct or other abnormality identified. 2. Advanced age-related atrophy with mild chronic microvascular ischemic changes.  MRI CERVICAL SPINE:  1. Normal appearance of the cervical spinal cord without signal abnormality to suggest subacute combined degeneration or other abnormality. No abnormal enhancement. 2. Extensive multilevel degenerative disc disease throughout the cervical spine as detailed above without significant canal or neural foraminal stenosis  MRI THORACIC SPINE:  1. Normal appearance of the thoracic spinal cord without signal abnormality to suggest subacute combined degeneration or other abnormality. No abnormal enhancement. 2. Degenerative disc bulging and facet arthropathy at the T10 through L1 levels without significant canal or neural foraminal stenosis. 3. Small to moderate bilateral pleural effusions with associated bibasilar atelectasis.   Electronically Signed   By: Jeannine Boga M.D.   On: 06/13/2013 04:16   Mr Thoracic Spine W Wo Contrast  06/13/2013   CLINICAL DATA:  Progressive lower extremity weakness.  EXAM: MRI HEAD WITHOUT CONTRAST  MRI CERVICAL SPINE WITHOUT AND WITH CONTRAST  TECHNIQUE: Multiplanar, multiecho pulse sequences of the brain and surrounding structures, cervical spine, and thoracic spine to include the craniocervical junction and cervicothoracic junction, were obtained without and with intravenous contrast.  CONTRAST:  63mL MULTIHANCE GADOBENATE DIMEGLUMINE 529 MG/ML IV SOLN  COMPARISON:  Prior CT from 10/25/2007  FINDINGS: MRI HEAD FINDINGS  Susceptibility artifact is present at the right parietal scalp.  Diffuse prominence of the CSF containing spaces is compatible with advanced age  related cerebral atrophy. Mild scattered and confluent T2/FLAIR hyperintensity seen within the periventricular and deep white matter and deep white matter, most compatible with chronic microvascular ischemic changes.  No mass lesion or midline shift. Ventricles are normal in size without evidence of hydrocephalus. No extra-axial fluid collection.  No diffusion-weighted signal abnormality is identified to suggest acute intracranial infarct. No intracranial hemorrhage identified. Normal flow voids are seen within the intracranial vasculature.  Craniocervical junction is normal. Pituitary gland within normal limits. Globes and optic nerves are within normal limits.  T1 hyperintense subcentimeter lesion noted within the left parietal calvarium, which may represent focal fatty marrow (series 11, image 200). This finding is of doubtful clinical significance. Otherwise, the Calvarium demonstrates a normal appearance with normal signal intensity. Degenerative changes noted within the upper spine.  MRI CERVICAL SPINE FINDINGS  The craniocervical junction is widely patent.  There is slight reversal of the normal cervical lordosis with apex at C5-6. Trace anterolisthesis of C3 on C4 is present.  Signal intensity within the vertebral body bone marrow is somewhat patchy and heterogeneous in appearance, which may be related to underlying osteoporosis. T1/T2 hyperintense lesion within the T1 vertebral body most likely reflects a benign hemangioma. Signal intensity within the cervical spinal cord is normal. No abnormal signal intensity seen within the dorsal columns to suggest subacute combined degeneration. No abnormal enhancement seen on post-contrast sequences.  At C2-3, there  is severe right with moderate left facet arthrosis and broad-based diffuse degenerative disc bulge. No significant canal or neural foraminal stenosis.  At C3-4, there is severe right with mild left facet arthrosis with mild broad-based degenerative disc  osteophyte. No significant canal or neural foraminal stenosis.  At C4-5, there is broad-based degenerative disc osteophyte with right greater than left uncovertebral osteophytosis and facet arthrosis. Left paracentral disc osteophyte flattens the left ventral thecal sac and abuts the ventral spinal cord. No significant canal or neural foraminal stenosis identified.  At C5-6, there is severe degenerative intervertebral disc space narrowing with broad-based degenerative disc osteophyte and bilateral facet arthrosis. Posterior disc osteophyte flattens and largely effaces the ventral thecal sac, abutting the cervical spinal cord. No significant canal stenosis or foraminal narrowing identified.  At C6-7, there is broad-based degenerative disc osteophyte with prominent bilateral facet arthrosis and uncovertebral osteophytosis. No significant canal stenosis identified. Neural foramina are widely patent.  At C7-T1, there is mild broad-based degenerative disc osteophyte with bilateral facet arthrosis. Resultant mild bilateral neural foraminal narrowing is present without central canal stenosis.  Visualized soft tissues of the neck are within normal limits. Normal flow voids are seen within the vertebral arteries.  MRI THORACIC SPINE FINDINGS:  The vertebral bodies are normally aligned with preservation of the normal thoracic kyphosis. Vertebral body heights are preserved. No acute fracture or or listhesis.  Signal intensity within the vertebral body bone marrow is somewhat patchy and heterogeneous in appearance, which may be related to osteoporosis. No discrete osseous lesions.  Signal intensity within the thoracic spinal cord is within normal limits. Dorsal columns demonstrate a normal appearance with normal signal intensity. No abnormal enhancement seen on post-contrast sequences. The nerve roots of the cauda equina are within normal limits. The conus terminates at the L1 level.  Multilevel degenerative disc bulging is  seen within the lower thoracic spine, most prevalent at T10-11, T11-12 and T12-L1. Mild to moderate bilateral facet hypertrophy is present at these levels as well. No definite focal disc protrusion identified within the thoracic spine. Degenerative disc bulging is also noted distally at the L1-2 and L2-3 levels on sagittal sequences.  Small bilateral pleural effusions with associated bibasilar atelectasis is present. Visualized paraspinous soft tissues are within normal limits.  IMPRESSION: MRI HEAD:  1. No acute intracranial infarct or other abnormality identified. 2. Advanced age-related atrophy with mild chronic microvascular ischemic changes.  MRI CERVICAL SPINE:  1. Normal appearance of the cervical spinal cord without signal abnormality to suggest subacute combined degeneration or other abnormality. No abnormal enhancement. 2. Extensive multilevel degenerative disc disease throughout the cervical spine as detailed above without significant canal or neural foraminal stenosis  MRI THORACIC SPINE:  1. Normal appearance of the thoracic spinal cord without signal abnormality to suggest subacute combined degeneration or other abnormality. No abnormal enhancement. 2. Degenerative disc bulging and facet arthropathy at the T10 through L1 levels without significant canal or neural foraminal stenosis. 3. Small to moderate bilateral pleural effusions with associated bibasilar atelectasis.   Electronically Signed   By: Jeannine Boga M.D.   On: 06/13/2013 04:16    Scheduled Meds: . aspirin  81 mg Oral Daily  . atorvastatin  20 mg Oral Q supper  . buPROPion  150 mg Oral Q breakfast  . carvedilol  6.25 mg Oral BID WC  . feeding supplement (ENSURE COMPLETE)  237 mL Oral BID BM  . ferrous sulfate  325 mg Oral Q breakfast  . levothyroxine  125 mcg  Oral QAC breakfast  . loperamide  2 mg Oral Daily  . loperamide  4 mg Oral QHS  . multivitamin with minerals  1 tablet Oral Daily  . mycophenolate  1,000 mg Oral  BID  . nutrition supplement (JUVEN)  1 packet Oral BID BM  . pantoprazole  40 mg Oral Daily  . potassium chloride  20 mEq Oral BID  . sodium chloride  3 mL Intravenous Q12H   Continuous Infusions:   Principal Problem:   Proximal muscle weakness Active Problems:   HYPOTHYROIDISM   ANXIETY   Anemia of chronic disease   Protein-calorie malnutrition, severe   Pedal edema   Cardiomyopathy   Weakness   Numbness of legs    Time spent: 35 min    Sian Joles  [facility] Pager [phone]. If 7PM-7AM, please contact night-coverage at [email or web address], password [facility] 06/14/2013, 12:04 PM  LOS: 3 days

## 2013-06-14 NOTE — Progress Notes (Signed)
Subjective: No significant changes.   Exam: Filed Vitals:   06/14/13 0607  BP: 114/61  Pulse: 66  Temp: 97.3 F (36.3 C)  Resp: 22   Gen: In bed, NAD MS: Awake, alert, oriented x 3  UX:LKGMW, eomi Motor: able to give good strength Sensory: decreased to temp to hips.  Vibration is absent to the knees, decreased to the elbows.  NUU:VOZDG in UE, difficult to ellicit ankle jerks. Knee reflex present bilaterally.    Impression: 80 yo F with worsening gait difficulty and LE numbness. Possibilities include myasthenia causing LE fatiguability, GBS, B12 causing posterior column dysfunciton/neuropathy. With her degree of decreased sensation, if due to GBS I would expect a more pronounced hyporeflexia. Would get MRI L-Spine, and LP. Platelets at 67 are not a contraindication to LP, but will have it performed under fluoro.    Recommendations: 1) MRI L-spine 2) PT/OT 3) LP today under fluoro for protein, glucose, cell count  Roland Rack, MD [facility] [phone]  If 7pm- 7am, please page neurology on call at [phone].

## 2013-06-14 NOTE — Progress Notes (Signed)
OT Cancellation Note  Patient Details Name: [name] MRN: [id number] DOB: 1925/02/28   Cancelled Treatment:    Reason Eval/Treat Not Completed: Medical issues which prohibited therapy. Patient on bedrest until 4:45 pm. Will plan to f/u with pt on Saturday.  06/14/2013 Darrol Jump OTR/L Pager [phone] Office [phone]

## 2013-06-15 ENCOUNTER — Inpatient Hospital Stay (HOSPITAL_COMMUNITY): Payer: Medicare Other

## 2013-06-15 DIAGNOSIS — D61818 Other pancytopenia: Secondary | ICD-10-CM

## 2013-06-15 LAB — BASIC METABOLIC PANEL
BUN: 29 mg/dL — ABNORMAL HIGH (ref 6–23)
CHLORIDE: 110 meq/L (ref 96–112)
CO2: 21 meq/L (ref 19–32)
CREATININE: 0.93 mg/dL (ref 0.50–1.10)
Calcium: 7.1 mg/dL — ABNORMAL LOW (ref 8.4–10.5)
GFR calc non Af Amer: 54 mL/min — ABNORMAL LOW (ref >90)
GFR, EST AFRICAN AMERICAN: 62 mL/min — AB (ref >90)
Glucose, Bld: 105 mg/dL — ABNORMAL HIGH (ref 70–99)
POTASSIUM: 4.6 meq/L (ref 3.7–5.3)
Sodium: 137 mEq/L (ref 137–147)

## 2013-06-15 LAB — CBC
HEMATOCRIT: 25.9 % — AB (ref 36.0–46.0)
HEMOGLOBIN: 8.5 g/dL — AB (ref 12.0–15.0)
MCH: 30.1 pg (ref 26.0–34.0)
MCHC: 32.8 g/dL (ref 30.0–36.0)
MCV: 91.8 fL (ref 78.0–100.0)
Platelets: 69 10*3/uL — ABNORMAL LOW (ref 150–400)
RBC: 2.82 MIL/uL — ABNORMAL LOW (ref 3.87–5.11)
RDW: 15.7 % — AB (ref 11.5–15.5)
WBC: 1.9 10*3/uL — ABNORMAL LOW (ref 4.0–10.5)

## 2013-06-15 NOTE — Progress Notes (Signed)
Subjective: Started on IVIG yesterday, no acute events.    Exam: Filed Vitals:   06/15/13 0439  BP: 101/53  Pulse: 56  Temp: 97.5 F (36.4 C)  Resp: 20   Gen: In bed, NAD MS: Awake, alert, oriented x 78  YO:VZCHY, eomi Motor: able to give good strength Sensory: decreased to temp to hips.  Vibration is absent to the knees, decreased to the elbows.  IFO:YDXAJ in UE, difficult to ellicit ankle jerks. Knee reflex present bilaterally.    Impression: 80 yo F with worsening gait difficulty and LE numbness. Possibilities include myasthenia causing LE fatiguability coupled with an underlying neuropathy, GBS, B12 causing posterior column dysfunciton/neuropathy. With her degree of decreased sensation, if due to GBS I would expect a more pronounced hyporeflexia. Would get MRI L-Spine. Also will send serum copper.  If due to MG or GBS, the treatment would be IVIG, and though I am not certain we have a definite diagnosis of one vs. The other, I think that IVIG treatment is prudent at this time.   Recommendations: 1) MRI L-spine 2) PT/OT 3) IVIG day 2/5  Roland Rack, MD [facility] [phone]  If 7pm- 7am, please page neurology on call at [phone].

## 2013-06-15 NOTE — Evaluation (Signed)
Occupational Therapy Evaluation Patient Details Name: Birgit Nowling MRN: [id number] DOB: 1924-09-10 Today's Date: 06/15/2013 Time: 5462-7035 OT Time Calculation (min): 29 min  OT Assessment / Plan / Recommendation History of present illness The patient is a 80 y.o. year-old female with history of myasthenia gravis on cellcept, uterine cancer, DVT s/p IVC filater because of GIB, hypothyroidism, NICM, PAF, anxiety.  Last fall, [name] was hospitalized for incarcerated hernia s/p surgical repair with concomitant cholecystectomy, course complicated by type 2 NSTEMI with cardiomyopathy and SBO.  She has also had CMV colitis and was taken off of her prednisone which had been used for her MG.  She has been living at home with her daughter and son-in-law and completing her ADLs with minimal assistance, using a walker for ambulation.  Two days ago, she was at her baseline.  Pt then began experiencing rapid weakness and was adm to [place] then transferred to [facility].    Clinical Impression   The patient is weaker than she was on initial eval by PT and now is being treated with IVIG for possible MG exacerbation or GBS. Pt presents with decreased sitting balance, decreased standing balance, increased cramping in LLE with activity all affecting pt's ability to perform her BADLs or A adequately with her BADLs and PTA she was Mod I with these. Feel she would benefit from in patient rehab.    OT Assessment  Patient needs continued OT Services    Follow Up Recommendations  CIR             Frequency  Min 2X/week    Precautions / Restrictions Precautions Precautions: Fall Restrictions Weight Bearing Restrictions: No   Pertinent Vitals/Pain 8/10 cramping in left thigh post getting from bed to recliner; massage, repositioned, heat applied    ADL  Eating/Feeding: Set up Where Assessed - Eating/Feeding: Bed level Grooming: Set up;Supervision/safety Where Assessed - Grooming: Supported  sitting Upper Body Bathing: Supervision/safety;Set up Where Assessed - Upper Body Bathing: Supported sitting Lower Body Bathing: Maximal assistance Where Assessed - Lower Body Bathing: Supported sit to stand Upper Body Dressing: Moderate assistance Where Assessed - Upper Body Dressing: Supported sitting Lower Body Dressing: +1 Total assistance Where Assessed - Lower Body Dressing: Supported sit to Lobbyist: Moderate assistance Toilet Transfer Method: Stand pivot Toilet Transfer Equipment:  (Bed to recliner going to pt's right) Toileting - Clothing Manipulation and Hygiene: +1 Total assistance Where Assessed - Toileting Clothing Manipulation and Hygiene: Standing Equipment Used: Gait belt;Rolling walker Transfers/Ambulation Related to ADLs: Mod A sit<stand and stand pivot    OT Diagnosis: Generalized weakness;Acute pain  OT Problem List: Decreased strength;Decreased activity tolerance;Impaired balance (sitting and/or standing);Pain;Decreased knowledge of use of DME or AE;Obesity OT Treatment Interventions: Self-care/ADL training;Patient/family education;Balance training;Therapeutic activities;DME and/or AE instruction   OT Goals(Current goals can be found in the care plan section) Acute Rehab OT Goals OT Goal Formulation: With patient Time For Goal Achievement: 06/29/13 Potential to Achieve Goals: Good  Visit Information  Last OT Received On: 06/15/13 Assistance Needed: +2 History of Present Illness: The patient is a 80 y.o. year-old female with history of myasthenia gravis on cellcept, uterine cancer, DVT s/p IVC filater because of GIB, hypothyroidism, NICM, PAF, anxiety.  Last fall, [name] was hospitalized for incarcerated hernia s/p surgical repair with concomitant cholecystectomy, course complicated by type 2 NSTEMI with cardiomyopathy and SBO.  She has also had CMV colitis and was taken off of her prednisone which had been used for her MG.  She has been living at  home with her daughter and son-in-law and completing her ADLs with minimal assistance, using a walker for ambulation.  Two days ago, she was at her baseline.  Pt then began experiencing rapid weakness and was adm to [place] then transferred to [facility].        Prior [place] expects to be discharged to:: Private residence Living Arrangements: Children Available Help at Discharge: Family;Available 24 hours/day Type of Home: House Home Access: Level entry Home Layout: Two level;Able to live on main level with bedroom/bathroom Home Equipment: Walker - 4 wheels;Shower seat - built in;Grab bars - toilet;Bedside commode Additional Comments: has a built in shower seat and tub shower Prior Function Level of Independence: Independent with assistive device(s) Comments: ambulates with rollator  Communication Communication: No difficulties Dominant Hand: Right         Vision/Perception Vision - History Patient Visual Report: No change from baseline   Cognition  Cognition Arousal/Alertness: Awake/alert Behavior During Therapy: WFL for tasks assessed/performed Overall Cognitive Status: Within Functional Limits for tasks assessed    Extremity/Trunk Assessment Upper Extremity Assessment Upper Extremity Assessment: Generalized weakness     Mobility Bed Mobility Overal bed mobility: Needs Assistance Bed Mobility: Supine to Sit Supine to sit: Mod assist;HOB elevated General bed mobility comments: A to advance LLE off of bed Transfers Overall transfer level: Needs assistance Equipment used: Rolling walker (2 wheeled) Transfers: Sit to/from [facility] Sit to Stand: +2 physical assistance;Mod assist Stand pivot transfers: Mod assist;+2 physical assistance General transfer comment: pt able to power up but requires (A) to acheive upright standing position and max cues for upright posture; pt with narrow BOS during transfer and very  anxioius due to recent weakness; max cues for hand placement and sequencing; use of gt belt to provide increas support during transfer         Balance Balance Overall balance assessment: Needs assistance Sitting-balance support: Feet supported;Single extremity supported Sitting balance-Leahy Scale: Fair Postural control: Posterior lean (with VCs to correct)   End of Session OT - End of Session Equipment Utilized During Treatment: Gait belt;Rolling walker Activity Tolerance: Patient limited by fatigue Patient left: in chair;with call bell/phone within reach Nurse Communication: Mobility status (NT and nursing student)    Almon Register [phone] 06/15/2013, 4:17 PM

## 2013-06-15 NOTE — Progress Notes (Signed)
PT Cancel Note:     Pt off floor for MRI.  Will f/u tomorrow.     Sarajane Marek, [place] [phone] 06/15/2013

## 2013-06-15 NOTE — Progress Notes (Signed)
[facility] PROGRESS NOTE  [name] [id number] DOB: 1925-03-08 DOA: 06/11/2013 PCP: Laurey Morale, MD  Assessment/Plan: Progressive weakness and lower extremity numbness.   - TSH ok and cortisol level ok - Continue to monitor for symptoms of underlying infection  - q4h neuro checks  - Neurology consultation - MRI done- ordered for lumbar spine as well-   LP 1/23 - PT/OT - home health - Air mattress  - started on IVIG- day 2/5   Leukopenia/thrombocytopenia -check CBC with diff -may need to d/c cellcept  Myasthenia Gravis with progressive weakness  - Continue cellcept  For now - Neurology consultation  - Monitor for evidence of respiratory distress  -incentive spirometry  PAF, not on A/C secondary to GIB, currently NSR   HTN/HLD, BP stable, hold statin in acute setting and continue BB   ICM, weight stable leg swelling and weight loss. CXR without interstitial edema. Hold lasix  Lower extremity edema, stable and likely due to low albumin, although she also has hx of DVT (has IVC filter)  - TED hose, elevate when able   CKD stage II/III, stable. Minimize nephrotoxins and renally dose medications   Hypothyroidism, continue synthroid   Anxiety, stable. 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 Defer to PCP   Thrombocytopenia, new onset, likely acute phase reactant   Progressive weight loss likely  - Start prn phenergan  - consider appetite stimulant once acute problem has been further investigated   Severe protein-calorie malnutrition with hypoalbuminemia and weight loss due to anorexia with nausea  - Nutrition consultation  - Dietary supplements  - Liberalized diet   Sacral decubitus ulcer  - Wound care consult  - Air mattress   Hx of uterine CA in remission   DVT LLE s/p IVC filter and not on A/C due to GIB         Code Status: DNR Family  Communication: patient Disposition Plan:    Consultants:  neuro  Procedures:    Antibiotics:    HPI/Subjective: C/o some pain No SOB, no CP  Objective: Filed Vitals:   06/15/13 1059  BP: 116/57  Pulse: 59  Temp: 98.9 F (37.2 C)  Resp: 20    Intake/Output Summary (Last 24 hours) at 06/15/13 1238 Last data filed at 06/15/13 1046  Gross per 24 hour  Intake    260 ml  Output      0 ml  Net    260 ml   Filed Weights   06/11/13 1900  Weight: 70.308 kg (155 lb)    Exam:   General:  Pleasant/cooperative  Cardiovascular: rrr  Respiratory: clear anterior  Abdomen: +BS, soft  Musculoskeletal: moves all 4 ext - weak LE  Data Reviewed: Basic Metabolic Panel:  Recent Labs Lab 06/11/13 1150 06/13/13 0550 06/14/13 0450 06/15/13 0500  NA 137 139 140 137  K 4.2 4.6 4.2 4.6  CL 106 111 112 110  CO2 20 21 21 21   GLUCOSE 94 99 86 105*  BUN 21 24* 21 29*  CREATININE 0.96 1.05 0.89 0.93  CALCIUM 7.9* 7.4* 7.3* 7.1*   Liver Function Tests:  Recent Labs Lab 06/11/13 1150  AST 20  ALT 15  ALKPHOS 75  BILITOT 0.6  PROT 3.8*  ALBUMIN 1.9*   No results found for this basename: LIPASE, AMYLASE,  in the last 168 hours No results found for this basename: AMMONIA,  in the last  168 hours CBC:  Recent Labs Lab 06/11/13 1150 06/13/13 0550 06/14/13 0450 06/15/13 0500  WBC 5.3 4.1 3.4* 1.9*  HGB 9.9* 8.5* 8.9* 8.5*  HCT 29.4* 26.3* 26.5* 25.9*  MCV 89.9 92.9 92.0 91.8  PLT 95* 67* 67* 69*   Cardiac Enzymes:  Recent Labs Lab 06/11/13 1150 06/11/13 1800  CKTOTAL 26  --   TROPONINI <0.30 <0.30   BNP (last 3 results)  Recent Labs  12/08/12 0026 03/27/13 0120  PROBNP 465.5* 5592.0*   CBG:  Recent Labs Lab 06/11/13 1046  GLUCAP 83    Recent Results (from the past 240 hour(s))  URINE CULTURE     Status: None   Collection Time    06/11/13 12:23 PM      Result Value Range Status   Specimen Description URINE, CLEAN CATCH   Final    Special Requests NONE   Final   Culture  Setup Time     Final   Value: 06/11/2013 16:38     Performed at [facility]     Final   Value: 30,000 COLONIES/ML     Performed at [facility]   Culture     Final   Value: Multiple bacterial morphotypes present, none predominant. Suggest appropriate recollection if clinically indicated.     Performed at [facility]   Report Status 06/12/2013 FINAL   Final     Studies: Mr Lumbar Spine Wo Contrast  06/15/2013   CLINICAL DATA:  Worsening gait difficulty. Lower extremity numbness.  EXAM: MRI LUMBAR SPINE WITHOUT CONTRAST  TECHNIQUE: Multiplanar, multisequence MR imaging was performed. No intravenous contrast was administered.  COMPARISON:  CT scan of the abdomen and pelvis dated 03/22/2013  FINDINGS: Tip of the conus is at L1 appears normal. The distal thoracic spinal cord appears normal. There appears to be a small amount of ascites in the abdomen with small amount of fluid around the right kidney, nonspecific. Multiple bilateral renal cysts.  Slight lumbar scoliosis with convexity to the left centered at L3.  T10-11 through L1-2: Slight disc space narrowing with tiny broad-based disc bulge is at each level with no neural impingement.  L2-3: Marked disc space narrowing. Small broad-based disc bulge with accompanying osteophytes with minimal narrowing of the spinal canal. No neural impingement. Slight hypertrophy of the right ligamentum flavum.  L3-4: Right anterior lateral osteophytes fuse the L3-4 level. Disc spaces markedly narrowed. Small broad-based osteophytes minimally narrow the spinal canal. Slight hypertrophy of the right ligamentum flavum creates slight narrowing of the right lateral recess without neural impingement.  L4-5:  The vertebral bodies are fused.  No neural impingement.  L5-S1: Slight disc space narrowing with a tiny broad-based disc bulge with accompanying osteophytes. Slight degenerative changes of  the facet joints with narrowing of the left lateral recess without impingement upon the exiting left S1 nerve.  IMPRESSION: 1. No neural impingement in the lumbar spine or lower thoracic spine. 2. Distal thoracic spinal cord appears normal. 3. Multilevel degenerative disc disease. 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   Electronically Signed   By: Rozetta Nunnery M.D.   On: 06/15/2013 10:40   Dg Fluoro Guide Lumbar Puncture  06/14/2013   CLINICAL DATA:  Bilateral lower extremity weakness.  EXAM: DIAGNOSTIC LUMBAR PUNCTURE UNDER FLUOROSCOPIC GUIDANCE  FLUOROSCOPY TIME:  40 seconds  PROCEDURE: Informed consent was obtained from the patient prior to the procedure, including potential complications of headache, allergy, and pain. With the patient prone,  the lower back was prepped with Betadine. 1% Lidocaine was used for local anesthesia. Lumbar puncture was performed at the L3-4 level using a 20 gauge needle with return of clear CSF with an opening pressure of 10 cm water. 7ml of CSF were obtained for laboratory studies. The patient tolerated the procedure well and there were no apparent complications.  IMPRESSION: Lumbar puncture without complication.   Electronically Signed   By: Inge Rise M.D.   On: 06/14/2013 12:15    Scheduled Meds: . aspirin  81 mg Oral Daily  . atorvastatin  20 mg Oral Q supper  . buPROPion  150 mg Oral Q breakfast  . carvedilol  6.25 mg Oral BID WC  . feeding supplement (ENSURE COMPLETE)  237 mL Oral BID BM  . ferrous sulfate  325 mg Oral Q breakfast  . Immune Globulin 5%  400 mg/kg Intravenous Q24 Hr x 5  . levothyroxine  125 mcg Oral QAC breakfast  . loperamide  2 mg Oral Daily  . loperamide  4 mg Oral QHS  . multivitamin with minerals  1 tablet Oral Daily  . mycophenolate  1,000 mg Oral BID  . nutrition supplement (JUVEN)  1 packet Oral BID BM  . pantoprazole  40 mg Oral Daily  . potassium chloride  20 mEq Oral BID  . sodium chloride  3 mL Intravenous Q12H   Continuous  Infusions:   Principal Problem:   Proximal muscle weakness Active Problems:   HYPOTHYROIDISM   ANXIETY   Anemia of chronic disease   Protein-calorie malnutrition, severe   Pedal edema   Cardiomyopathy   Weakness   Numbness of legs    Time spent: 35 min    VANN, JESSICA  [facility] Pager [phone]. If 7PM-7AM, please contact night-coverage at [email or web address], password [facility] 06/15/2013, 12:38 PM  LOS: 4 days

## 2013-06-16 DIAGNOSIS — I82409 Acute embolism and thrombosis of unspecified deep veins of unspecified lower extremity: Secondary | ICD-10-CM

## 2013-06-16 DIAGNOSIS — R609 Edema, unspecified: Secondary | ICD-10-CM

## 2013-06-16 DIAGNOSIS — D631 Anemia in chronic kidney disease: Secondary | ICD-10-CM

## 2013-06-16 DIAGNOSIS — N189 Chronic kidney disease, unspecified: Secondary | ICD-10-CM

## 2013-06-16 DIAGNOSIS — G7000 Myasthenia gravis without (acute) exacerbation: Secondary | ICD-10-CM

## 2013-06-16 DIAGNOSIS — G610 Guillain-Barre syndrome: Principal | ICD-10-CM

## 2013-06-16 DIAGNOSIS — N039 Chronic nephritic syndrome with unspecified morphologic changes: Secondary | ICD-10-CM

## 2013-06-16 LAB — BASIC METABOLIC PANEL
BUN: 24 mg/dL — ABNORMAL HIGH (ref 6–23)
CHLORIDE: 107 meq/L (ref 96–112)
CO2: 20 meq/L (ref 19–32)
Calcium: 7 mg/dL — ABNORMAL LOW (ref 8.4–10.5)
Creatinine, Ser: 0.82 mg/dL (ref 0.50–1.10)
GFR calc Af Amer: 72 mL/min — ABNORMAL LOW (ref >90)
GFR calc non Af Amer: 63 mL/min — ABNORMAL LOW (ref >90)
GLUCOSE: 104 mg/dL — AB (ref 70–99)
Potassium: 4.5 mEq/L (ref 3.7–5.3)
SODIUM: 134 meq/L — AB (ref 137–147)

## 2013-06-16 LAB — CBC WITH DIFFERENTIAL/PLATELET
BASOS PCT: 0 % (ref 0–1)
Basophils Absolute: 0 10*3/uL (ref 0.0–0.1)
Eosinophils Absolute: 0.1 10*3/uL (ref 0.0–0.7)
Eosinophils Relative: 2 % (ref 0–5)
HEMATOCRIT: 26.4 % — AB (ref 36.0–46.0)
Hemoglobin: 8.8 g/dL — ABNORMAL LOW (ref 12.0–15.0)
LYMPHS ABS: 0.2 10*3/uL — AB (ref 0.7–4.0)
Lymphocytes Relative: 9 % — ABNORMAL LOW (ref 12–46)
MCH: 30.4 pg (ref 26.0–34.0)
MCHC: 33.3 g/dL (ref 30.0–36.0)
MCV: 91.3 fL (ref 78.0–100.0)
MONOS PCT: 10 % (ref 3–12)
Monocytes Absolute: 0.3 10*3/uL (ref 0.1–1.0)
NEUTROS ABS: 2.1 10*3/uL (ref 1.7–7.7)
Neutrophils Relative %: 79 % — ABNORMAL HIGH (ref 43–77)
Platelets: 79 10*3/uL — ABNORMAL LOW (ref 150–400)
RBC: 2.89 MIL/uL — AB (ref 3.87–5.11)
RDW: 15.6 % — ABNORMAL HIGH (ref 11.5–15.5)
WBC: 2.7 10*3/uL — ABNORMAL LOW (ref 4.0–10.5)

## 2013-06-16 NOTE — Progress Notes (Signed)
Subjective: Started on IVIG yesterday, no acute events.    Exam: Filed Vitals:   06/16/13 0940  BP: 109/63  Pulse: 65  Temp: 97.7 F (36.5 C)  Resp: 18   Gen: In bed, NAD MS: Awake, alert, oriented x 3  XY:VOPFY, eomi Motor: able to give good strength Sensory: decreased to temp to hips.  Vibration is decreased in the toes and knees, but she does have some vibratory sensation now. This is improved since previous days. .  TWK:MQKMM in UE, difficult to ellicit ankle jerks. Knee reflex present bilaterally.    Impression: 80 yo F with worsening gait difficulty and LE numbness. Possibilities include myasthenia causing LE fatiguability coupled with an underlying neuropathy, GBS, B12 causing posterior column dysfunciton/neuropathy.   With improvement in vibratory sensation, would consider GBS much more strongly.   Recommendations: 1) copper pending 2) PT/OT 3) IVIG day 3/5   Roland Rack, MD [facility] [phone]  If 7pm- 7am, please page neurology on call at [phone].

## 2013-06-16 NOTE — Progress Notes (Signed)
Dr. Eliseo Squires and Dr. Leonel Ramsay made aware of positive BLE dopplers. Will monitor.

## 2013-06-16 NOTE — Progress Notes (Signed)
Physical Therapy Treatment Patient Details Name: [name] MRN: [id number] DOB: Oct 18, 1924 Today's Date: 06/16/2013 Time: 6270-3500 PT Time Calculation (min): 24 min  PT Assessment / Plan / Recommendation  History of Present Illness The patient is a 80 y.o. year-old female with history of myasthenia gravis on cellcept, uterine cancer, DVT s/p IVC filater because of GIB, hypothyroidism, NICM, PAF, anxiety.  Last fall, [name] was hospitalized for incarcerated hernia s/p surgical repair with concomitant cholecystectomy, course complicated by type 2 NSTEMI with cardiomyopathy and SBO.  She has also had CMV colitis and was taken off of her prednisone which had been used for her MG.  She has been living at home with her daughter and son-in-law and completing her ADLs with minimal assistance, using a walker for ambulation.  Two days ago, she was at her baseline.  Pt then began experiencing rapid weakness and was adm to [place] then transferred to [facility].    PT Comments   Patient with increase in weakness since PT session on 1/21, impacting functional mobility.  Recommend Inpatient Rehab consult for comprehensive therapies prior to return home.  Follow Up Recommendations  CIR     Does the patient have the potential to tolerate intense rehabilitation     Barriers to Discharge        Equipment Recommendations  Wheelchair (measurements PT);Wheelchair cushion (measurements PT)    Recommendations for Other Services Rehab consult  Frequency Min 3X/week   Progress towards PT Goals Progress towards PT goals: Not progressing toward goals - comment (Patient with increase in weakness, impacting mobility)  Plan Discharge plan needs to be updated    Precautions / Restrictions Precautions Precautions: Fall Restrictions Weight Bearing Restrictions: No   Pertinent Vitals/Pain Pain impacting mobility    Mobility  Bed Mobility Overal bed mobility: Needs Assistance Bed Mobility: Supine to  Sit;Sit to Supine Supine to sit: Mod assist;HOB elevated Sit to supine: Mod assist;+2 for physical assistance General bed mobility comments: Verbal cues for technique.  Assist to move LE's off of bed and to raise trunk to sitting.  Once upright, patient with fair sitting balance. Patient sat EOB x 6 minutes working on sitting balance, posture, and activity tolerance.  Required +2 assist to return to supine. Transfers Overall transfer level: Needs assistance Equipment used: Rolling walker (2 wheeled) Transfers: Sit to/from Stand Sit to Stand: Mod assist;+2 physical assistance General transfer comment: Verbal cues for hand placement.  Assist to rise to standing and for balance.  Patient stood x 3 minutes.  Attempted to march in place - difficulty maintaining LE control when raising opposite foot from floor..  Patient declined transfer to chair.      PT Goals (current goals can now be found in the care plan section)    Visit Information  Last PT Received On: 06/16/13 Assistance Needed: +2 History of Present Illness: The patient is a 80 y.o. year-old female with history of myasthenia gravis on cellcept, uterine cancer, DVT s/p IVC filater because of GIB, hypothyroidism, NICM, PAF, anxiety.  Last fall, [name] was hospitalized for incarcerated hernia s/p surgical repair with concomitant cholecystectomy, course complicated by type 2 NSTEMI with cardiomyopathy and SBO.  She has also had CMV colitis and was taken off of her prednisone which had been used for her MG.  She has been living at home with her daughter and son-in-law and completing her ADLs with minimal assistance, using a walker for ambulation.  Two days ago, she was at her baseline.  Pt then began experiencing rapid weakness and was adm to [place] then transferred to [facility].     Subjective Data  Subjective: "I can try"  Max encouragement for participation   Cognition  Cognition Arousal/Alertness: Awake/alert Behavior During  Therapy: WFL for tasks assessed/performed Overall Cognitive Status: Within Functional Limits for tasks assessed    Balance  Balance Overall balance assessment: Needs assistance Sitting-balance support: Single extremity supported;Feet supported Sitting balance-Leahy Scale: Fair Sitting balance - Comments: Patient sat EOB x 6 minutes Postural control: Posterior lean Standing balance support: Bilateral upper extremity supported Standing balance-Leahy Scale: Poor Standing balance comment: Stood x 3 minutes.  Attempted marching in place - difficulty maintaining LE control in stance to lift opposite LE.  End of Session PT - End of Session Equipment Utilized During Treatment: Gait belt Activity Tolerance: Patient limited by fatigue;Patient limited by pain Patient left: in bed;with call bell/phone within reach;with family/visitor present Nurse Communication: Mobility status;Patient requests pain meds   GP     Despina Pole 06/16/2013, 5:35 PM Carita Pian. Sanjuana Kava, [place] Pager [phone]

## 2013-06-16 NOTE — Progress Notes (Signed)
Rehab Admissions Coordinator Note:  Patient was screened by Cleatrice Burke for appropriateness for an Inpatient Acute Rehab Consult.  At this time, inpt rehab consult will be completed Monday.  Cleatrice Burke 06/16/2013, 5:24 PM  I can be reached at [phone].

## 2013-06-16 NOTE — Progress Notes (Addendum)
[facility] PROGRESS NOTE  [name] [id number] DOB: Sep 23, 1924 DOA: 06/11/2013 PCP: Laurey Morale, MD  Assessment/Plan: Progressive weakness and lower extremity numbness.   - TSH ok and cortisol level ok - Continue to monitor for symptoms of underlying infection  - q4h neuro checks  - Neurology consultation - MRI done- ordered for lumbar spine as well-   LP 1/23- suspect GB as patient has improved with IVIG - PT/OT - CIR?  - Air mattress  - started on IVIG- day 3/5  DVT- -?HIT, will call oncology- ? Since trending up ?heparin exposure -since 1/9 (outpatient labs) plts have dropped about half- 146---> 79 -?cellcept causing- plts and WBC to go down  Leukopenia/thrombocytopenia -WBC improved  Myasthenia Gravis with progressive weakness  - Continue cellcept  For now - Neurology consultation  - Monitor for evidence of respiratory distress  -incentive spirometry  PAF, not on A/C secondary to GIB, currently NSR   HTN/HLD, BP stable, hold statin in acute setting and continue BB   ICM, weight stable leg swelling and weight loss. CXR without interstitial edema. Hold lasix  Lower extremity edema, stable and likely due to low albumin, although she also has hx of DVT (has IVC filter)  - TED hose, elevate when able   CKD stage II/III, stable. Minimize nephrotoxins and renally dose medications   Hypothyroidism, continue synthroid   Anxiety, stable. 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 Defer to PCP   Thrombocytopenia, new onset, likely acute phase reactant   Progressive weight loss likely  - Start prn phenergan  - consider appetite stimulant once acute problem has been further investigated   Severe protein-calorie malnutrition with hypoalbuminemia and weight loss due to anorexia with nausea  - Nutrition consultation  - Dietary supplements  - Liberalized diet    Sacral decubitus ulcer  - Wound care consult  - Air mattress   Hx of uterine CA in remission   DVT LLE s/p IVC filter and not on A/C due to GIB         Code Status: DNR Family Communication: patient Disposition Plan: ?CIR   Consultants:  neuro  Procedures:    Antibiotics:    HPI/Subjective: Nausea this AM  Objective: Filed Vitals:   06/16/13 0940  BP: 109/63  Pulse: 65  Temp: 97.7 F (36.5 C)  Resp: 18    Intake/Output Summary (Last 24 hours) at 06/16/13 1121 Last data filed at 06/15/13 1746  Gross per 24 hour  Intake    140 ml  Output      0 ml  Net    140 ml   Filed Weights   06/11/13 1900  Weight: 70.308 kg (155 lb)    Exam:   General:  Pleasant/cooperative  Cardiovascular: rrr  Respiratory: clear anterior  Abdomen: +BS, soft  Musculoskeletal: moves all 4 ext - weak LE  Data Reviewed: Basic Metabolic Panel:  Recent Labs Lab 06/11/13 1150 06/13/13 0550 06/14/13 0450 06/15/13 0500 06/16/13 0500  NA 137 139 140 137 134*  K 4.2 4.6 4.2 4.6 4.5  CL 106 111 112 110 107  CO2 20 21 21 21 20   GLUCOSE 94 99 86 105* 104*  BUN 21 24* 21 29* 24*  CREATININE 0.96 1.05 0.89 0.93 0.82  CALCIUM 7.9* 7.4* 7.3* 7.1* 7.0*   Liver Function Tests:  Recent Labs Lab 06/11/13 1150  AST 20  ALT 15  ALKPHOS  75  BILITOT 0.6  PROT 3.8*  ALBUMIN 1.9*   No results found for this basename: LIPASE, AMYLASE,  in the last 168 hours No results found for this basename: AMMONIA,  in the last 168 hours CBC:  Recent Labs Lab 06/11/13 1150 06/13/13 0550 06/14/13 0450 06/15/13 0500 06/16/13 0500  WBC 5.3 4.1 3.4* 1.9* 2.7*  NEUTROABS  --   --   --   --  2.1  HGB 9.9* 8.5* 8.9* 8.5* 8.8*  HCT 29.4* 26.3* 26.5* 25.9* 26.4*  MCV 89.9 92.9 92.0 91.8 91.3  PLT 95* 67* 67* 69* 79*   Cardiac Enzymes:  Recent Labs Lab 06/11/13 1150 06/11/13 1800  CKTOTAL 26  --   TROPONINI <0.30 <0.30   BNP (last 3 results)  Recent Labs   12/08/12 0026 03/27/13 0120  PROBNP 465.5* 5592.0*   CBG:  Recent Labs Lab 06/11/13 1046  GLUCAP 83    Recent Results (from the past 240 hour(s))  URINE CULTURE     Status: None   Collection Time    06/11/13 12:23 PM      Result Value Range Status   Specimen Description URINE, CLEAN CATCH   Final   Special Requests NONE   Final   Culture  Setup Time     Final   Value: 06/11/2013 16:38     Performed at [facility]     Final   Value: 30,000 COLONIES/ML     Performed at [facility]   Culture     Final   Value: Multiple bacterial morphotypes present, none predominant. Suggest appropriate recollection if clinically indicated.     Performed at [facility]   Report Status 06/12/2013 FINAL   Final     Studies: Mr Lumbar Spine Wo Contrast  06/15/2013   CLINICAL DATA:  Worsening gait difficulty. Lower extremity numbness.  EXAM: MRI LUMBAR SPINE WITHOUT CONTRAST  TECHNIQUE: Multiplanar, multisequence MR imaging was performed. No intravenous contrast was administered.  COMPARISON:  CT scan of the abdomen and pelvis dated 03/22/2013  FINDINGS: Tip of the conus is at L1 appears normal. The distal thoracic spinal cord appears normal. There appears to be a small amount of ascites in the abdomen with small amount of fluid around the right kidney, nonspecific. Multiple bilateral renal cysts.  Slight lumbar scoliosis with convexity to the left centered at L3.  T10-11 through L1-2: Slight disc space narrowing with tiny broad-based disc bulge is at each level with no neural impingement.  L2-3: Marked disc space narrowing. Small broad-based disc bulge with accompanying osteophytes with minimal narrowing of the spinal canal. No neural impingement. Slight hypertrophy of the right ligamentum flavum.  L3-4: Right anterior lateral osteophytes fuse the L3-4 level. Disc spaces markedly narrowed. Small broad-based osteophytes minimally narrow the spinal canal. Slight  hypertrophy of the right ligamentum flavum creates slight narrowing of the right lateral recess without neural impingement.  L4-5:  The vertebral bodies are fused.  No neural impingement.  L5-S1: Slight disc space narrowing with a tiny broad-based disc bulge with accompanying osteophytes. Slight degenerative changes of the facet joints with narrowing of the left lateral recess without impingement upon the exiting left S1 nerve.  IMPRESSION: 1. No neural impingement in the lumbar spine or lower thoracic spine. 2. Distal thoracic spinal cord appears normal. 3. Multilevel degenerative disc disease. 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   Electronically Signed   By: Rozetta Nunnery M.D.   On:  06/15/2013 10:40   Dg Fluoro Guide Lumbar Puncture  06/14/2013   CLINICAL DATA:  Bilateral lower extremity weakness.  EXAM: DIAGNOSTIC LUMBAR PUNCTURE UNDER FLUOROSCOPIC GUIDANCE  FLUOROSCOPY TIME:  40 seconds  PROCEDURE: Informed consent was obtained from the patient prior to the procedure, including potential complications of headache, allergy, and pain. With the patient prone, the lower back was prepped with Betadine. 1% Lidocaine was used for local anesthesia. Lumbar puncture was performed at the L3-4 level using a 20 gauge needle with return of clear CSF with an opening pressure of 10 cm water. 75ml of CSF were obtained for laboratory studies. The patient tolerated the procedure well and there were no apparent complications.  IMPRESSION: Lumbar puncture without complication.   Electronically Signed   By: Inge Rise M.D.   On: 06/14/2013 12:15    Scheduled Meds: . aspirin  81 mg Oral Daily  . atorvastatin  20 mg Oral Q supper  . buPROPion  150 mg Oral Q breakfast  . carvedilol  6.25 mg Oral BID WC  . feeding supplement (ENSURE COMPLETE)  237 mL Oral BID BM  . ferrous sulfate  325 mg Oral Q breakfast  . Immune Globulin 5%  400 mg/kg Intravenous Q24 Hr x 5  . levothyroxine  125 mcg Oral QAC breakfast  . loperamide  2  mg Oral Daily  . loperamide  4 mg Oral QHS  . multivitamin with minerals  1 tablet Oral Daily  . mycophenolate  1,000 mg Oral BID  . nutrition supplement (JUVEN)  1 packet Oral BID BM  . pantoprazole  40 mg Oral Daily  . potassium chloride  20 mEq Oral BID  . sodium chloride  3 mL Intravenous Q12H   Continuous Infusions:   Principal Problem:   Proximal muscle weakness Active Problems:   HYPOTHYROIDISM   ANXIETY   Anemia of chronic disease   Protein-calorie malnutrition, severe   Pedal edema   Cardiomyopathy   Weakness   Numbness of legs    Time spent: 35 min    Therin Vetsch  [facility] Pager [phone]. If 7PM-7AM, please contact night-coverage at [email or web address], password [facility] 06/16/2013, 11:21 AM  LOS: 5 days

## 2013-06-16 NOTE — Progress Notes (Signed)
[place] [phone]   [phone]  NEW PATIENT EVALUATION   Name: [name] Date: 06/16/2013 MRN: [id number] DOB: Mar 01, 1925  PCP: Laurey Morale, MD   REFERRING PHYSICIAN: Princella Ion REASON FOR REFERRAL: B/L DVT, concern for HIT    HISTORY OF PRESENT ILLNESS:[name] is a 80 y.o. female who is with a history of atrial fibrillation, diastolic CHF, nonobstructive CAD by prior cardiac catheterization, HTN, HL, myasthenia gravis, ulcerative colitis and CMV colitis, L DVT s/p IVC Filter (01/24/2013) secondary to GIB admitted and transferred to [facility] on 01/20  with progressive bilateral lower extremity weakness not consistent with her prior myasthenia flare. Neurology was consulted and lumbar puncture obtain.  They recommended MRI L-spine and IVIG was started on 01/23 for suspicion of MG or GBS.  She is on day #3 of 5.    Due to worsening lower extremity pain and swelling a Lower extremity duplex was obtained and revealed the following: R DVT noted in the common femoral, profunda, femoral, popliteal, posterior tibial, and peroneal veins and L DVT noted in the common femoral, profunda, femoral, popliteal, gastrocnemius, posterior tibial, and peroneal veins. Prior venous duplex on 01/14/2013 revealed deep vein thrombosis involving the left common femoral vein, left femoral vein, left popliteal vein, left posterial tibial vein, left peronealvein, and left gastrocnemius vein.  On this admission, her platelets were 95,000, with a white count of 5.3 and hemoglobin of 9.9 with an MCV of 89.0, RDW of 15.3.  She is on iron supplementation with ferrous sulfate 325 mg with breakfast, and was given vitamin B-12 shots monthly by Dr. Sarajane Jews.  She was continued on cellceptat1,000 mg bid on 02/12/2013. Her platelets on 02/09/2013 were 90,000 then 257,000 later on 03/22/2013. She also was discharged on 02/12/2014 on Valcyte 900 mg p.o. b.i.d. for total of 3 weeks, then  decreased to 900 mg daily and follow up per Infectious Disease, Dr. Michel Bickers for her CMV colitis.  We are being consulted due to thrombocytopenia and bilateral DVT with suspicion for HIT.  I spoke with the patient nurse.  She denies any bleeding near IV sites.  She developed significant rectal bleeding on Coumadin and had an IVC filter placed as noted above. Her hemoglobin was down to 7 and she was transfused with packed red blood cells. She was deemed a poor candidate for anticoagulation at that time. She had a prolonged hospitalization December 2014 due to an incarcerated hernia complicated by small bowel obstruction, atrial fibrillation with RVR, acute renal failure and NSTEMI  PAST MEDICAL HISTORY:  has a past medical history of Anemia; Hyperlipemia; Hypertension; Thyroid disease; Anxiety; Vitamin B 12 deficiency; TIA (transient ischemic attack); Microscopic hematuria; Gall stone; Osteoporosis; Myasthenia gravis; Ulcerative colitis; DVT of leg (deep venous thrombosis) (12/2012); CMV colitis; Cancer; Chronic systolic CHF (congestive heart failure); Cardiomyopathy; Paroxysmal atrial fibrillation; and NSTEMI (non-ST elevated myocardial infarction).     PAST SURGICAL HISTORY: Past Surgical History  Procedure Laterality Date  . Cardiac catheterization      04/1999  . Abdominal hysterectomy      with oophorectomy  . Colonoscopy  1999    normal   . Colonoscopy N/A 12/10/2012    Procedure: COLONOSCOPY;  Surgeon: Gatha Mayer, MD;  Location: [facility];  Service: Endoscopy;  Laterality: N/A;  . Flexible sigmoidoscopy N/A 01/26/2013    Procedure: FLEXIBLE SIGMOIDOSCOPY;  Surgeon: Gatha Mayer, MD;  Location: [facility];  Service: Endoscopy;  Laterality: N/A;  . Ventral hernia repair  N/A 03/25/2013    Procedure: LAPAROSCOPIC VENTRAL HERNIA REPAIR ;  Surgeon: Ardeth Sportsman, MD;  Location: [facility];  Service: General;  Laterality: N/A;  . Cholecystectomy N/A 03/25/2013    Procedure: LAPAROSCOPIC  CHOLECYSTECTOMY WITH INTRAOPERATIVE CHOLANGIOGRAM;  Surgeon: Ardeth Sportsman, MD;  Location: [facility];  Service: General;  Laterality: N/A;  . Insertion of mesh N/A 03/25/2013    Procedure: INSERTION OF MESH;  Surgeon: Ardeth Sportsman, MD;  Location: [facility];  Service: General;  Laterality: N/A;  . Laparoscopic lysis of adhesions N/A 03/25/2013    Procedure: LAPAROSCOPIC LYSIS OF ADHESIONS;  Surgeon: Ardeth Sportsman, MD;  Location: [facility];  Service: General;  Laterality: N/A;  . Omentectomy N/A 03/25/2013    Procedure: PARTIAL OMENTECTOMY;  Surgeon: Ardeth Sportsman, MD;  Location: [facility];  Service: General;  Laterality: N/A;   ALLERGIES: Zofran; Ivp dye; and Sulfamethoxazole  SOCIAL HISTORY:  reports that she has never smoked. She has never used smokeless tobacco. She reports that she does not drink alcohol or use illicit drugs.  FAMILY HISTORY: family history includes Colon cancer in her daughter; Heart disease in her daughter, father, sister, and son.  LABORATORY DATA:  Results for orders placed during the hospital encounter of 06/11/13 (from the past 48 hour(s))  BASIC METABOLIC PANEL     Status: Abnormal   Collection Time    06/15/13  5:00 AM      Result Value Range   Sodium 137  137 - 147 mEq/L   Potassium 4.6  3.7 - 5.3 mEq/L   Chloride 110  96 - 112 mEq/L   CO2 21  19 - 32 mEq/L   Glucose, Bld 105 (*) 70 - 99 mg/dL   BUN 29 (*) 6 - 23 mg/dL   Creatinine, Ser 3.00  0.50 - 1.10 mg/dL   Calcium 7.1 (*) 8.4 - 10.5 mg/dL   GFR calc non Af Amer 54 (*) >90 mL/min   GFR calc Af Amer 62 (*) >90 mL/min   Comment: (NOTE)     The eGFR has been calculated using the CKD EPI equation.     This calculation has not been validated in all clinical situations.     eGFR's persistently <90 mL/min signify possible Chronic Kidney     Disease.  CBC     Status: Abnormal   Collection Time    06/15/13  5:00 AM      Result Value Range   WBC 1.9 (*) 4.0 - 10.5 K/uL   RBC 2.82 (*) 3.87 - 5.11 MIL/uL    Hemoglobin 8.5 (*) 12.0 - 15.0 g/dL   HCT 97.9 (*) 49.9 - 71.8 %   MCV 91.8  78.0 - 100.0 fL   MCH 30.1  26.0 - 34.0 pg   MCHC 32.8  30.0 - 36.0 g/dL   RDW 20.9 (*) 90.6 - 89.3 %   Platelets 69 (*) 150 - 400 K/uL   Comment: CONSISTENT WITH PREVIOUS RESULT  BASIC METABOLIC PANEL     Status: Abnormal   Collection Time    06/16/13  5:00 AM      Result Value Range   Sodium 134 (*) 137 - 147 mEq/L   Potassium 4.5  3.7 - 5.3 mEq/L   Chloride 107  96 - 112 mEq/L   CO2 20  19 - 32 mEq/L   Glucose, Bld 104 (*) 70 - 99 mg/dL   BUN 24 (*) 6 - 23 mg/dL   Creatinine, Ser 4.06  0.50 - 1.10 mg/dL   Calcium 7.0 (*) 8.4 - 10.5 mg/dL   GFR calc non Af Amer 63 (*) >90 mL/min   GFR calc Af Amer 72 (*) >90 mL/min   Comment: (NOTE)     The eGFR has been calculated using the CKD EPI equation.     This calculation has not been validated in all clinical situations.     eGFR's persistently <90 mL/min signify possible Chronic Kidney     Disease.  CBC WITH DIFFERENTIAL     Status: Abnormal   Collection Time    06/16/13  5:00 AM      Result Value Range   WBC 2.7 (*) 4.0 - 10.5 K/uL   RBC 2.89 (*) 3.87 - 5.11 MIL/uL   Hemoglobin 8.8 (*) 12.0 - 15.0 g/dL   HCT 26.4 (*) 36.0 - 46.0 %   MCV 91.3  78.0 - 100.0 fL   MCH 30.4  26.0 - 34.0 pg   MCHC 33.3  30.0 - 36.0 g/dL   RDW 15.6 (*) 11.5 - 15.5 %   Platelets 79 (*) 150 - 400 K/uL   Comment: CONSISTENT WITH PREVIOUS RESULT   Neutrophils Relative % 79 (*) 43 - 77 %   Lymphocytes Relative 9 (*) 12 - 46 %   Monocytes Relative 10  3 - 12 %   Eosinophils Relative 2  0 - 5 %   Basophils Relative 0  0 - 1 %   Neutro Abs 2.1  1.7 - 7.7 K/uL   Lymphs Abs 0.2 (*) 0.7 - 4.0 K/uL   Monocytes Absolute 0.3  0.1 - 1.0 K/uL   Eosinophils Absolute 0.1  0.0 - 0.7 K/uL   Basophils Absolute 0.0  0.0 - 0.1 K/uL   WBC Morphology TOXIC GRANULATION     Comment: VACUOLATED NEUTROPHILS     INCREASED BANDS (>20% BANDS)     DOHLE BODIES      Results for TAWONA, FILSINGER (MRN [id number]) as of 06/16/2013 15:20  Ref. Range 05/07/2013 14:28 05/31/2013 12:16 06/11/2013 11:50 06/13/2013 05:50 06/14/2013 04:50 06/15/2013 05:00 06/16/2013 05:00  Platelets Latest Range: 150-400 K/uL 172.0 146.0 (L) 95 (L) 67 (L) 67 (L) 69 (L) 79 (L)   Results for LILIT, CINELLI (MRN [id number]) as of 06/16/2013 15:20  Ref. Range 05/07/2013 14:28 05/31/2013 12:16 06/11/2013 11:50 06/13/2013 05:50 06/14/2013 04:50 06/15/2013 05:00 06/16/2013 05:00  WBC Latest Range: 4.0-10.5 K/uL 4.3 (L) 5.3 5.3 4.1 3.4 (L) 1.9 (L) 2.7 (L)  Results for ANACRISTINA, STEFFEK (MRN [id number]) as of 06/16/2013 15:20  Ref. Range 05/31/2013 12:16 06/11/2013 11:50 06/13/2013 05:50 06/14/2013 04:50 06/15/2013 05:00 06/16/2013 05:00  Hemoglobin Latest Range: 12.0-15.0 g/dL 10.7 (L) 9.9 (L) 8.5 (L) 8.9 (L) 8.5 (L) 8.8 (L)   Results for BAMBI, FEHNEL (MRN [id number]) as of 06/16/2013 16:30  Ref. Range 12/08/2012 08:28  Iron Latest Range: 42-135 ug/dL 26 (L)  UIBC Latest Range: 125-400 ug/dL 126  TIBC Latest Range: 250-470 ug/dL 152 (L)  Saturation Ratios Latest Range: 20-55 % 17 (L)  Ferritin Latest Range: 10-291 ng/mL 323 (H)  Folate No range found >20.0   RADIOGRAPHY: Dg Chest 2 View  06/11/2013   CLINICAL DATA:  Hypertension.  History of uterine cancer.  EXAM: CHEST  2 VIEW  COMPARISON:  Single view of the chest 03/25/2013.  FINDINGS: Port-A-Cath remains in place. There is cardiomegaly without edema. Lungs are clear. No pneumothorax or pleural effusion. No focal bony abnormality.  IMPRESSION: Cardiomegaly without acute disease.   Electronically  Signed   By: Inge Rise M.D.   On: 06/11/2013 13:29   Mr Brain Wo Contrast  06/13/2013   CLINICAL DATA:  Progressive lower extremity weakness.  EXAM: MRI HEAD WITHOUT CONTRAST  MRI CERVICAL SPINE WITHOUT AND WITH CONTRAST  TECHNIQUE: Multiplanar, multiecho pulse sequences of the brain and surrounding structures, cervical spine, and thoracic spine to include the  craniocervical junction and cervicothoracic junction, were obtained without and with intravenous contrast.  CONTRAST:  25mL MULTIHANCE GADOBENATE DIMEGLUMINE 529 MG/ML IV SOLN  COMPARISON:  Prior CT from 10/25/2007  FINDINGS: MRI HEAD FINDINGS  Susceptibility artifact is present at the right parietal scalp.  Diffuse prominence of the CSF containing spaces is compatible with advanced age related cerebral atrophy. Mild scattered and confluent T2/FLAIR hyperintensity seen within the periventricular and deep white matter and deep white matter, most compatible with chronic microvascular ischemic changes.  No mass lesion or midline shift. Ventricles are normal in size without evidence of hydrocephalus. No extra-axial fluid collection.  No diffusion-weighted signal abnormality is identified to suggest acute intracranial infarct. No intracranial hemorrhage identified. Normal flow voids are seen within the intracranial vasculature.  Craniocervical junction is normal. Pituitary gland within normal limits. Globes and optic nerves are within normal limits.  T1 hyperintense subcentimeter lesion noted within the left parietal calvarium, which may represent focal fatty marrow (series 11, image 200). This finding is of doubtful clinical significance. Otherwise, the Calvarium demonstrates a normal appearance with normal signal intensity. Degenerative changes noted within the upper spine.  MRI CERVICAL SPINE FINDINGS  The craniocervical junction is widely patent.  There is slight reversal of the normal cervical lordosis with apex at C5-6. Trace anterolisthesis of C3 on C4 is present.  Signal intensity within the vertebral body bone marrow is somewhat patchy and heterogeneous in appearance, which may be related to underlying osteoporosis. T1/T2 hyperintense lesion within the T1 vertebral body most likely reflects a benign hemangioma. Signal intensity within the cervical spinal cord is normal. No abnormal signal intensity seen within  the dorsal columns to suggest subacute combined degeneration. No abnormal enhancement seen on post-contrast sequences.  At C2-3, there is severe right with moderate left facet arthrosis and broad-based diffuse degenerative disc bulge. No significant canal or neural foraminal stenosis.  At C3-4, there is severe right with mild left facet arthrosis with mild broad-based degenerative disc osteophyte. No significant canal or neural foraminal stenosis.  At C4-5, there is broad-based degenerative disc osteophyte with right greater than left uncovertebral osteophytosis and facet arthrosis. Left paracentral disc osteophyte flattens the left ventral thecal sac and abuts the ventral spinal cord. No significant canal or neural foraminal stenosis identified.  At C5-6, there is severe degenerative intervertebral disc space narrowing with broad-based degenerative disc osteophyte and bilateral facet arthrosis. Posterior disc osteophyte flattens and largely effaces the ventral thecal sac, abutting the cervical spinal cord. No significant canal stenosis or foraminal narrowing identified.  At C6-7, there is broad-based degenerative disc osteophyte with prominent bilateral facet arthrosis and uncovertebral osteophytosis. No significant canal stenosis identified. Neural foramina are widely patent.  At C7-T1, there is mild broad-based degenerative disc osteophyte with bilateral facet arthrosis. Resultant mild bilateral neural foraminal narrowing is present without central canal stenosis.  Visualized soft tissues of the neck are within normal limits. Normal flow voids are seen within the vertebral arteries.  MRI THORACIC SPINE FINDINGS:  The vertebral bodies are normally aligned with preservation of the normal thoracic kyphosis. Vertebral body heights are preserved. No acute fracture or  or listhesis.  Signal intensity within the vertebral body bone marrow is somewhat patchy and heterogeneous in appearance, which may be related to  osteoporosis. No discrete osseous lesions.  Signal intensity within the thoracic spinal cord is within normal limits. Dorsal columns demonstrate a normal appearance with normal signal intensity. No abnormal enhancement seen on post-contrast sequences. The nerve roots of the cauda equina are within normal limits. The conus terminates at the L1 level.  Multilevel degenerative disc bulging is seen within the lower thoracic spine, most prevalent at T10-11, T11-12 and T12-L1. Mild to moderate bilateral facet hypertrophy is present at these levels as well. No definite focal disc protrusion identified within the thoracic spine. Degenerative disc bulging is also noted distally at the L1-2 and L2-3 levels on sagittal sequences.  Small bilateral pleural effusions with associated bibasilar atelectasis is present. Visualized paraspinous soft tissues are within normal limits.  IMPRESSION: MRI HEAD:  1. No acute intracranial infarct or other abnormality identified. 2. Advanced age-related atrophy with mild chronic microvascular ischemic changes.  MRI CERVICAL SPINE:  1. Normal appearance of the cervical spinal cord without signal abnormality to suggest subacute combined degeneration or other abnormality. No abnormal enhancement. 2. Extensive multilevel degenerative disc disease throughout the cervical spine as detailed above without significant canal or neural foraminal stenosis  MRI THORACIC SPINE:  1. Normal appearance of the thoracic spinal cord without signal abnormality to suggest subacute combined degeneration or other abnormality. No abnormal enhancement. 2. Degenerative disc bulging and facet arthropathy at the T10 through L1 levels without significant canal or neural foraminal stenosis. 3. Small to moderate bilateral pleural effusions with associated bibasilar atelectasis.   Electronically Signed   By: Jeannine Boga M.D.   On: 06/13/2013 04:16   Mr Lumbar Spine Wo Contrast  06/15/2013   CLINICAL DATA:   Worsening gait difficulty. Lower extremity numbness.  EXAM: MRI LUMBAR SPINE WITHOUT CONTRAST  TECHNIQUE: Multiplanar, multisequence MR imaging was performed. No intravenous contrast was administered.  COMPARISON:  CT scan of the abdomen and pelvis dated 03/22/2013  FINDINGS: Tip of the conus is at L1 appears normal. The distal thoracic spinal cord appears normal. There appears to be a small amount of ascites in the abdomen with small amount of fluid around the right kidney, nonspecific. Multiple bilateral renal cysts.  Slight lumbar scoliosis with convexity to the left centered at L3.  T10-11 through L1-2: Slight disc space narrowing with tiny broad-based disc bulge is at each level with no neural impingement.  L2-3: Marked disc space narrowing. Small broad-based disc bulge with accompanying osteophytes with minimal narrowing of the spinal canal. No neural impingement. Slight hypertrophy of the right ligamentum flavum.  L3-4: Right anterior lateral osteophytes fuse the L3-4 level. Disc spaces markedly narrowed. Small broad-based osteophytes minimally narrow the spinal canal. Slight hypertrophy of the right ligamentum flavum creates slight narrowing of the right lateral recess without neural impingement.  L4-5:  The vertebral bodies are fused.  No neural impingement.  L5-S1: Slight disc space narrowing with a tiny broad-based disc bulge with accompanying osteophytes. Slight degenerative changes of the facet joints with narrowing of the left lateral recess without impingement upon the exiting left S1 nerve.  IMPRESSION: 1. No neural impingement in the lumbar spine or lower thoracic spine. 2. Distal thoracic spinal cord appears normal. 3. Multilevel degenerative disc disease. 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   Electronically Signed   By: Rozetta Nunnery M.D.   On: 06/15/2013 10:40   Mr Cervical Spine  W Wo Contrast  06/13/2013   CLINICAL DATA:  Progressive lower extremity weakness.  EXAM: MRI HEAD WITHOUT CONTRAST   MRI CERVICAL SPINE WITHOUT AND WITH CONTRAST  TECHNIQUE: Multiplanar, multiecho pulse sequences of the brain and surrounding structures, cervical spine, and thoracic spine to include the craniocervical junction and cervicothoracic junction, were obtained without and with intravenous contrast.  CONTRAST:  93mL MULTIHANCE GADOBENATE DIMEGLUMINE 529 MG/ML IV SOLN  COMPARISON:  Prior CT from 10/25/2007  FINDINGS: MRI HEAD FINDINGS  Susceptibility artifact is present at the right parietal scalp.  Diffuse prominence of the CSF containing spaces is compatible with advanced age related cerebral atrophy. Mild scattered and confluent T2/FLAIR hyperintensity seen within the periventricular and deep white matter and deep white matter, most compatible with chronic microvascular ischemic changes.  No mass lesion or midline shift. Ventricles are normal in size without evidence of hydrocephalus. No extra-axial fluid collection.  No diffusion-weighted signal abnormality is identified to suggest acute intracranial infarct. No intracranial hemorrhage identified. Normal flow voids are seen within the intracranial vasculature.  Craniocervical junction is normal. Pituitary gland within normal limits. Globes and optic nerves are within normal limits.  T1 hyperintense subcentimeter lesion noted within the left parietal calvarium, which may represent focal fatty marrow (series 11, image 200). This finding is of doubtful clinical significance. Otherwise, the Calvarium demonstrates a normal appearance with normal signal intensity. Degenerative changes noted within the upper spine.  MRI CERVICAL SPINE FINDINGS  The craniocervical junction is widely patent.  There is slight reversal of the normal cervical lordosis with apex at C5-6. Trace anterolisthesis of C3 on C4 is present.  Signal intensity within the vertebral body bone marrow is somewhat patchy and heterogeneous in appearance, which may be related to underlying osteoporosis. T1/T2  hyperintense lesion within the T1 vertebral body most likely reflects a benign hemangioma. Signal intensity within the cervical spinal cord is normal. No abnormal signal intensity seen within the dorsal columns to suggest subacute combined degeneration. No abnormal enhancement seen on post-contrast sequences.  At C2-3, there is severe right with moderate left facet arthrosis and broad-based diffuse degenerative disc bulge. No significant canal or neural foraminal stenosis.  At C3-4, there is severe right with mild left facet arthrosis with mild broad-based degenerative disc osteophyte. No significant canal or neural foraminal stenosis.  At C4-5, there is broad-based degenerative disc osteophyte with right greater than left uncovertebral osteophytosis and facet arthrosis. Left paracentral disc osteophyte flattens the left ventral thecal sac and abuts the ventral spinal cord. No significant canal or neural foraminal stenosis identified.  At C5-6, there is severe degenerative intervertebral disc space narrowing with broad-based degenerative disc osteophyte and bilateral facet arthrosis. Posterior disc osteophyte flattens and largely effaces the ventral thecal sac, abutting the cervical spinal cord. No significant canal stenosis or foraminal narrowing identified.  At C6-7, there is broad-based degenerative disc osteophyte with prominent bilateral facet arthrosis and uncovertebral osteophytosis. No significant canal stenosis identified. Neural foramina are widely patent.  At C7-T1, there is mild broad-based degenerative disc osteophyte with bilateral facet arthrosis. Resultant mild bilateral neural foraminal narrowing is present without central canal stenosis.  Visualized soft tissues of the neck are within normal limits. Normal flow voids are seen within the vertebral arteries.  MRI THORACIC SPINE FINDINGS:  The vertebral bodies are normally aligned with preservation of the normal thoracic kyphosis. Vertebral body  heights are preserved. No acute fracture or or listhesis.  Signal intensity within the vertebral body bone marrow is somewhat patchy and  heterogeneous in appearance, which may be related to osteoporosis. No discrete osseous lesions.  Signal intensity within the thoracic spinal cord is within normal limits. Dorsal columns demonstrate a normal appearance with normal signal intensity. No abnormal enhancement seen on post-contrast sequences. The nerve roots of the cauda equina are within normal limits. The conus terminates at the L1 level.  Multilevel degenerative disc bulging is seen within the lower thoracic spine, most prevalent at T10-11, T11-12 and T12-L1. Mild to moderate bilateral facet hypertrophy is present at these levels as well. No definite focal disc protrusion identified within the thoracic spine. Degenerative disc bulging is also noted distally at the L1-2 and L2-3 levels on sagittal sequences.  Small bilateral pleural effusions with associated bibasilar atelectasis is present. Visualized paraspinous soft tissues are within normal limits.  IMPRESSION: MRI HEAD:  1. No acute intracranial infarct or other abnormality identified. 2. Advanced age-related atrophy with mild chronic microvascular ischemic changes.  MRI CERVICAL SPINE:  1. Normal appearance of the cervical spinal cord without signal abnormality to suggest subacute combined degeneration or other abnormality. No abnormal enhancement. 2. Extensive multilevel degenerative disc disease throughout the cervical spine as detailed above without significant canal or neural foraminal stenosis  MRI THORACIC SPINE:  1. Normal appearance of the thoracic spinal cord without signal abnormality to suggest subacute combined degeneration or other abnormality. No abnormal enhancement. 2. Degenerative disc bulging and facet arthropathy at the T10 through L1 levels without significant canal or neural foraminal stenosis. 3. Small to moderate bilateral pleural effusions  with associated bibasilar atelectasis.   Electronically Signed   By: Rise Mu M.D.   On: 06/13/2013 04:16   Mr Thoracic Spine W Wo Contrast  06/13/2013   CLINICAL DATA:  Progressive lower extremity weakness.  EXAM: MRI HEAD WITHOUT CONTRAST  MRI CERVICAL SPINE WITHOUT AND WITH CONTRAST  TECHNIQUE: Multiplanar, multiecho pulse sequences of the brain and surrounding structures, cervical spine, and thoracic spine to include the craniocervical junction and cervicothoracic junction, were obtained without and with intravenous contrast.  CONTRAST:  39mL MULTIHANCE GADOBENATE DIMEGLUMINE 529 MG/ML IV SOLN  COMPARISON:  Prior CT from 10/25/2007  FINDINGS: MRI HEAD FINDINGS  Susceptibility artifact is present at the right parietal scalp.  Diffuse prominence of the CSF containing spaces is compatible with advanced age related cerebral atrophy. Mild scattered and confluent T2/FLAIR hyperintensity seen within the periventricular and deep white matter and deep white matter, most compatible with chronic microvascular ischemic changes.  No mass lesion or midline shift. Ventricles are normal in size without evidence of hydrocephalus. No extra-axial fluid collection.  No diffusion-weighted signal abnormality is identified to suggest acute intracranial infarct. No intracranial hemorrhage identified. Normal flow voids are seen within the intracranial vasculature.  Craniocervical junction is normal. Pituitary gland within normal limits. Globes and optic nerves are within normal limits.  T1 hyperintense subcentimeter lesion noted within the left parietal calvarium, which may represent focal fatty marrow (series 11, image 200). This finding is of doubtful clinical significance. Otherwise, the Calvarium demonstrates a normal appearance with normal signal intensity. Degenerative changes noted within the upper spine.  MRI CERVICAL SPINE FINDINGS  The craniocervical junction is widely patent.  There is slight reversal of the  normal cervical lordosis with apex at C5-6. Trace anterolisthesis of C3 on C4 is present.  Signal intensity within the vertebral body bone marrow is somewhat patchy and heterogeneous in appearance, which may be related to underlying osteoporosis. T1/T2 hyperintense lesion within the T1 vertebral body most likely reflects  a benign hemangioma. Signal intensity within the cervical spinal cord is normal. No abnormal signal intensity seen within the dorsal columns to suggest subacute combined degeneration. No abnormal enhancement seen on post-contrast sequences.  At C2-3, there is severe right with moderate left facet arthrosis and broad-based diffuse degenerative disc bulge. No significant canal or neural foraminal stenosis.  At C3-4, there is severe right with mild left facet arthrosis with mild broad-based degenerative disc osteophyte. No significant canal or neural foraminal stenosis.  At C4-5, there is broad-based degenerative disc osteophyte with right greater than left uncovertebral osteophytosis and facet arthrosis. Left paracentral disc osteophyte flattens the left ventral thecal sac and abuts the ventral spinal cord. No significant canal or neural foraminal stenosis identified.  At C5-6, there is severe degenerative intervertebral disc space narrowing with broad-based degenerative disc osteophyte and bilateral facet arthrosis. Posterior disc osteophyte flattens and largely effaces the ventral thecal sac, abutting the cervical spinal cord. No significant canal stenosis or foraminal narrowing identified.  At C6-7, there is broad-based degenerative disc osteophyte with prominent bilateral facet arthrosis and uncovertebral osteophytosis. No significant canal stenosis identified. Neural foramina are widely patent.  At C7-T1, there is mild broad-based degenerative disc osteophyte with bilateral facet arthrosis. Resultant mild bilateral neural foraminal narrowing is present without central canal stenosis.  Visualized  soft tissues of the neck are within normal limits. Normal flow voids are seen within the vertebral arteries.  MRI THORACIC SPINE FINDINGS:  The vertebral bodies are normally aligned with preservation of the normal thoracic kyphosis. Vertebral body heights are preserved. No acute fracture or or listhesis.  Signal intensity within the vertebral body bone marrow is somewhat patchy and heterogeneous in appearance, which may be related to osteoporosis. No discrete osseous lesions.  Signal intensity within the thoracic spinal cord is within normal limits. Dorsal columns demonstrate a normal appearance with normal signal intensity. No abnormal enhancement seen on post-contrast sequences. The nerve roots of the cauda equina are within normal limits. The conus terminates at the L1 level.  Multilevel degenerative disc bulging is seen within the lower thoracic spine, most prevalent at T10-11, T11-12 and T12-L1. Mild to moderate bilateral facet hypertrophy is present at these levels as well. No definite focal disc protrusion identified within the thoracic spine. Degenerative disc bulging is also noted distally at the L1-2 and L2-3 levels on sagittal sequences.  Small bilateral pleural effusions with associated bibasilar atelectasis is present. Visualized paraspinous soft tissues are within normal limits.  IMPRESSION: MRI HEAD:  1. No acute intracranial infarct or other abnormality identified. 2. Advanced age-related atrophy with mild chronic microvascular ischemic changes.  MRI CERVICAL SPINE:  1. Normal appearance of the cervical spinal cord without signal abnormality to suggest subacute combined degeneration or other abnormality. No abnormal enhancement. 2. Extensive multilevel degenerative disc disease throughout the cervical spine as detailed above without significant canal or neural foraminal stenosis  MRI THORACIC SPINE:  1. Normal appearance of the thoracic spinal cord without signal abnormality to suggest subacute  combined degeneration or other abnormality. No abnormal enhancement. 2. Degenerative disc bulging and facet arthropathy at the T10 through L1 levels without significant canal or neural foraminal stenosis. 3. Small to moderate bilateral pleural effusions with associated bibasilar atelectasis.   Electronically Signed   By: Jeannine Boga M.D.   On: 06/13/2013 04:16   Dg Fluoro Guide Lumbar Puncture  06/14/2013   CLINICAL DATA:  Bilateral lower extremity weakness.  EXAM: DIAGNOSTIC LUMBAR PUNCTURE UNDER FLUOROSCOPIC GUIDANCE  FLUOROSCOPY TIME:  40 seconds  PROCEDURE: Informed consent was obtained from the patient prior to the procedure, including potential complications of headache, allergy, and pain. With the patient prone, the lower back was prepped with Betadine. 1% Lidocaine was used for local anesthesia. Lumbar puncture was performed at the L3-4 level using a 20 gauge needle with return of clear CSF with an opening pressure of 10 cm water. 35ml of CSF were obtained for laboratory studies. The patient tolerated the procedure well and there were no apparent complications.  IMPRESSION: Lumbar puncture without complication.   Electronically Signed   By: Inge Rise M.D.   On: 06/14/2013 12:15    REVIEW OF SYSTEMS:  Constitutional: Denies fevers, chills or abnormal weight loss Eyes: Denies blurriness of vision Ears, nose, mouth, throat, and face: Denies mucositis or sore throat Respiratory: Denies cough, dyspnea or wheezes Cardiovascular: Denies palpitation, chest discomfort or lower extremity swelling Gastrointestinal:  Denies nausea, heartburn or change in bowel habits Skin: Denies abnormal skin rashes Lymphatics: Denies new lymphadenopathy or easy bruising Neurological:Reports lower extremity numbness, tingling and  weaknesses Behavioral/Psych: Mood is stable, no new changes  All other systems were reviewed with the patient and are negative.  PHYSICAL EXAM:  height is $RemoveB'5\' 4"'aCoMCFji$  (1.626 m) and  weight is 155 lb (70.308 kg). Her oral temperature is 98.4 F (36.9 C). Her blood pressure is 102/61 and her pulse is 59. Her respiration is 18 and oxygen saturation is 98%.    GENERAL:alert, no distress and comfortable; laying in bed non-toxic SKIN: skin color, texture, turgor are normal, no rashes or significant lesions; She has a Port-a-Cath in place in the right chest  EYES: normal, Conjunctiva are pink and non-injected, sclera clear OROPHARYNX:no exudate, no erythema and lips, buccal mucosa, and tongue normal  NECK: supple, thyroid normal size, non-tender, without nodularity LYMPH:  no palpable lymphadenopathy in the cervical, axillary or inguinal LUNGS: clear to auscultation and percussion with normal breathing effort HEART: regular rate & rhythm and no murmurs and 1+ lower extremity edema ABDOMEN:abdomen soft, non-tender and normal bowel sounds Musculoskeletal:no cyanosis of digits and no clubbing  NEURO: alert & oriented x 3 with fluent speech, decreased lower extremity strength bilaterlly   IMPRESSION: Sarahjane Matherly is a 80 y.o. female with a history of   Smear reviewed significant for toxic granulation, increased bands and dohle bodies.   1. Thrombocytopenia --Her 4-T score for heparin induced thrombocytopenia suggest a low probability. Clinically, besides further clots off anticoagulation with clot risk factors, she has no bleeding at intravenous sites or additional symptoms suggestive of HIT.  I would recommend against any argatroban especially in the setting of her recent GIB on anti-coagulation.    Other causes of her low plts could certainly be medications, i.e., Cellcept versus low grade DIC.  She continues to be a poor risk for anticoagulation.   Timeline and current plt trend increasing from 69 to 79 (consistently trending with her WBC, and Hemoglobin) points await from HIT.  --We can send for DIC panel given smear suggestive of possible infection; however, her increased  left shift can also be related to steroids. If clinically she remains afebrile and without signs of infection, it is likely the latter.  Monitor counts and plt trend.   2. Bilateral DVTs s/p IVC placement --She likely developed additional clots due to prolonged immobilization, prior history of DVTs.  In addition, IVC filters themselves are pro-coagulants and only help prevent clot propagation to the lungs which can be life threatening. --H.o.  L DVT (01/2013) s/p successful placement of an infrarenal IVC filter via the right IJ on 01/26/2013  3. Anemia:  Secondary cellcept +/- CKDz and chronic disease. Cont Ferrous sulfate 325 mg daily. Asymptomatic for signs of anemia presently.  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  5. Myasthenia gravis: On cell cept.  7. Hypothyroidism: On synthroid.   All questions were answered. The patient knows to call the clinic with any problems, questions or concerns. We can certainly see the patient much sooner if necessary.  I spent 40 minutes counseling the patient face to face. The total time spent in the appointment was 60 minutes.    Akanksha Bellmore, MD 06/16/2013 2:14 PM

## 2013-06-16 NOTE — Progress Notes (Signed)
Bilateral lower extremity venous duplex completed.  Right:  DVT noted in the common femoral, profunda, femoral, popliteal, posterior tibial, and peroneal veins.  No evidence of superficial thrombosis.  No Baker's cyst.  Left: DVT noted in the common femoral, profunda, femoral, popliteal, gastrocnemius, posterior tibial, and peroneal veins.  Superficial thrombus noted in the proximal greater saphenous vein.  No Baker's cyst.

## 2013-06-17 DIAGNOSIS — D696 Thrombocytopenia, unspecified: Secondary | ICD-10-CM

## 2013-06-17 DIAGNOSIS — G7000 Myasthenia gravis without (acute) exacerbation: Secondary | ICD-10-CM

## 2013-06-17 LAB — BASIC METABOLIC PANEL
BUN: 23 mg/dL (ref 6–23)
CALCIUM: 6.9 mg/dL — AB (ref 8.4–10.5)
CO2: 21 mEq/L (ref 19–32)
Chloride: 106 mEq/L (ref 96–112)
Creatinine, Ser: 0.82 mg/dL (ref 0.50–1.10)
GFR calc Af Amer: 72 mL/min — ABNORMAL LOW (ref >90)
GFR, EST NON AFRICAN AMERICAN: 63 mL/min — AB (ref >90)
GLUCOSE: 83 mg/dL (ref 70–99)
Potassium: 4.6 mEq/L (ref 3.7–5.3)
Sodium: 135 mEq/L — ABNORMAL LOW (ref 137–147)

## 2013-06-17 LAB — CBC
HCT: 25.1 % — ABNORMAL LOW (ref 36.0–46.0)
Hemoglobin: 8.2 g/dL — ABNORMAL LOW (ref 12.0–15.0)
MCH: 29.9 pg (ref 26.0–34.0)
MCHC: 32.7 g/dL (ref 30.0–36.0)
MCV: 91.6 fL (ref 78.0–100.0)
PLATELETS: 80 10*3/uL — AB (ref 150–400)
RBC: 2.74 MIL/uL — AB (ref 3.87–5.11)
RDW: 15.7 % — ABNORMAL HIGH (ref 11.5–15.5)
WBC: 2.5 10*3/uL — ABNORMAL LOW (ref 4.0–10.5)

## 2013-06-17 NOTE — Progress Notes (Signed)
UR complete.  Brenyn Petrey RN, MSN 

## 2013-06-17 NOTE — Consult Note (Signed)
Physical Medicine and Rehabilitation Consult Reason for Consult: Deconditioning/myasthenia gravis Referring Physician: [facility]   HPI: [name] is a 80 y.o. right-handed female with history of myasthenia gravis on CellCept, uterine cancer, DVT with IVC filter, GI bleed. Patient well known to rehabilitation services from admission November 2014 for deconditioning related to incarcerated hernia. Patient has been living with her daughter and son-in-law completing her ADLs minimal assistance using a walker for ambulation. Admitted 06/11/2013 with progressive weakness over the last 2 days involving the lower extremities with numbness. MRI of the brain negative for any acute changes. MRI cervical and thoracic spine is unremarkable except for some degenerative disc. MRI lumbar spine with no neural impingement. Neurology consulted possibilities included myasthenia gravis causing lower extremity fatigability versus possible GBS and placed on IVIG x5 days. Venous Doppler studies bilateral lower extremity showed DVT common femoral, profunda, femoral, popliteal, posterior tibial and perineal veins. Patient does have an IVC filter in place. Oncology services consulted for thrombocytopenia question HIT which was ruled out. Close monitoring of latest platelet count 80,000 as well as hemoglobin 8.2. Physical occupational therapy evaluations completed an ongoing with recommendations of physical medicine rehabilitation consult to consider inpatient rehabilitation services.   Review of Systems  Musculoskeletal: Positive for back pain, joint pain and myalgias.  Neurological: Positive for tingling and weakness.  Psychiatric/Behavioral:       Anxiety  All other systems reviewed and are negative.   Past Medical History  Diagnosis Date  . Anemia   . Hyperlipemia   . Hypertension   . Thyroid disease     hypothyroidism  . Anxiety   . Vitamin B 12 deficiency   . TIA (transient ischemic attack)     4-09  .  Microscopic hematuria     benign microscopic hematuria, worked up b Dr Katrine Coho  . Gall stone     porcelain gall bladder with single stone  per CT 02-2009 Dr Newton Pigg  . Osteoporosis     DEXA on 04-30-10  . Myasthenia gravis     sees Dr. Krista Blue   . Ulcerative colitis   . DVT of leg (deep venous thrombosis) 12/2012    left leg; coumadin d/c b/c of LGI bleed; s/p IVC filter  . CMV colitis   . Cancer     uterine/ovarian  . Chronic systolic CHF (congestive heart failure)   . Cardiomyopathy     a. in setting of incarcerated hernia, AF with RVR, ARF, NSTEMI => probable [place] CM given WM abnormality; Echocardiogram (03/23/13): Mod LVH, EF 30-35% with AK of the entire apical myocardium, AS/AL/Inf HK, Gr 1 DD, aortic sclerosis, MAC, mild MR, atrial septal lipomatous hypertrophy, mod TR, PASP 37;   Echo (04/2013): EF 60-65% normal wall motion, Gr 1 DD, MAC, mild to mod LAE  . Paroxysmal atrial fibrillation     not on coumadin due to AF in setting of acute illness as well as prior GI bleed on coumadin for DVT  . NSTEMI (non-ST elevated myocardial infarction)     a. 03/2013 in setting of incarcerated ventral hernia, SBO, ARF => likely Type 2 NSTEMI   Past Surgical History  Procedure Laterality Date  . Cardiac catheterization      04/1999  . Abdominal hysterectomy      with oophorectomy  . Colonoscopy  1999    normal   . Colonoscopy N/A 12/10/2012    Procedure: COLONOSCOPY;  Surgeon: Gatha Mayer, MD;  Location: [facility];  Service: Endoscopy;  Laterality:  N/A;  . Flexible sigmoidoscopy N/A 01/26/2013    Procedure: FLEXIBLE SIGMOIDOSCOPY;  Surgeon: Gatha Mayer, MD;  Location: [facility];  Service: Endoscopy;  Laterality: N/A;  . Ventral hernia repair N/A 03/25/2013    Procedure: LAPAROSCOPIC VENTRAL HERNIA REPAIR ;  Surgeon: Adin Hector, MD;  Location: [facility];  Service: General;  Laterality: N/A;  . Cholecystectomy N/A 03/25/2013    Procedure: LAPAROSCOPIC CHOLECYSTECTOMY  WITH INTRAOPERATIVE CHOLANGIOGRAM;  Surgeon: Adin Hector, MD;  Location: [facility];  Service: General;  Laterality: N/A;  . Insertion of mesh N/A 03/25/2013    Procedure: INSERTION OF MESH;  Surgeon: Adin Hector, MD;  Location: [facility];  Service: General;  Laterality: N/A;  . Laparoscopic lysis of adhesions N/A 03/25/2013    Procedure: LAPAROSCOPIC LYSIS OF ADHESIONS;  Surgeon: Adin Hector, MD;  Location: [facility];  Service: General;  Laterality: N/A;  . Omentectomy N/A 03/25/2013    Procedure: PARTIAL OMENTECTOMY;  Surgeon: Adin Hector, MD;  Location: [facility];  Service: General;  Laterality: N/A;   Family History  Problem Relation Age of Onset  . Heart disease Father   . Heart disease Sister   . Colon cancer Daughter   . Heart disease Son   . Heart disease Daughter    Social History:  reports that she has never smoked. She has never used smokeless tobacco. She reports that she does not drink alcohol or use illicit drugs. Allergies:  Allergies  Allergen Reactions  . Zofran [Ondansetron Hcl]     hypotension  . Ivp Dye [Iodinated Diagnostic Agents] Rash  . Sulfamethoxazole Rash    REACTION: unspecified   Medications Prior to Admission  Medication Sig Dispense Refill  . ALPRAZolam (XANAX) 0.25 MG tablet Take 0.25 mg by mouth 2 (two) times daily as needed for anxiety.      . ALPRAZolam (XANAX) 1 MG tablet Take 1 mg by mouth at bedtime as needed for anxiety or sleep.      Marland Kitchen aspirin 81 MG chewable tablet Chew 1 tablet (81 mg total) by mouth daily.      Marland Kitchen atorvastatin (LIPITOR) 20 MG tablet Take 1 tablet (20 mg total) by mouth daily with supper.  30 tablet  1  . buPROPion (WELLBUTRIN XL) 150 MG 24 hr tablet Take 1 tablet (150 mg total) by mouth every morning.  30 tablet  11  . carvedilol (COREG) 6.25 MG tablet Take 1 tablet (6.25 mg total) by mouth 2 (two) times daily with a meal.  60 tablet  11  . cyanocobalamin (,VITAMIN B-12,) 1000 MCG/ML injection Inject 1,000 mcg into the muscle  every 30 (thirty) days.      . Esomeprazole Magnesium (NEXIUM PO) Take 1 capsule by mouth daily.       . feeding supplement (ENSURE COMPLETE) LIQD Take 237 mLs by mouth daily.  30 Bottle  0  . ferrous sulfate 325 (65 FE) MG tablet Take 325 mg by mouth daily with breakfast.      . furosemide (LASIX) 20 MG tablet Take 1 tablet (20 mg total) by mouth daily as needed (take if weight goes up by 2 lbs overnight or 5 lbs in a week.).  30 tablet  1  . Glucosamine HCl (GLUCOSAMINE PO) Take 1 tablet by mouth daily.      Marland Kitchen levothyroxine (SYNTHROID, LEVOTHROID) 125 MCG tablet Take 1 tablet (125 mcg total) by mouth daily.  30 tablet  11  . loperamide (IMODIUM) 2 MG capsule Take  2-4 mg by mouth 2 (two) times daily. 2mg  in the morning and 4mg  at night      . mycophenolate (CELLCEPT) 500 MG tablet Take 2 tablets (1,000 mg total) by mouth 2 (two) times daily.  60 tablet  1  . potassium chloride (K-DUR) 10 MEQ tablet Take 20 mEq by mouth 2 (two) times daily.      . Probiotic Product (PROBIOTIC DAILY PO) Take 1 tablet by mouth daily.         Home: Home Living Family/patient expects to be discharged to:: Private residence Living Arrangements: Children Available Help at Discharge: Family;Available 24 hours/day Type of Home: House Home Access: Level entry Home Layout: Two level;Able to live on main level with bedroom/bathroom Home Equipment: Walker - 4 wheels;Shower seat - built in;Grab bars - toilet;Bedside commode Additional Comments: has a built in shower seat and tub shower  Functional History: Prior Function Comments: ambulates with rollator  Functional Status:  Mobility:          ADL: ADL Eating/Feeding: Set up Where Assessed - Eating/Feeding: Bed level Grooming: Set up;Supervision/safety Where Assessed - Grooming: Supported sitting Upper Body Bathing: Supervision/safety;Set up Where Assessed - Upper Body Bathing: Supported sitting Lower Body Bathing: Maximal assistance Where Assessed -  Lower Body Bathing: Supported sit to stand Upper Body Dressing: Moderate assistance Where Assessed - Upper Body Dressing: Supported sitting Lower Body Dressing: +1 Total assistance Where Assessed - Lower Body Dressing: Supported sit to Lobbyist: Moderate assistance Toilet Transfer Method: Stand pivot Toilet Transfer Equipment:  (Bed to recliner going to pt's right) Equipment Used: Gait belt;Rolling walker Transfers/Ambulation Related to ADLs: Mod A sit<stand and stand pivot  Cognition: Cognition Overall Cognitive Status: Within Functional Limits for tasks assessed Orientation Level: Oriented X4 Cognition Arousal/Alertness: Awake/alert Behavior During Therapy: WFL for tasks assessed/performed Overall Cognitive Status: Within Functional Limits for tasks assessed  Blood pressure 120/53, pulse 64, temperature 98.2 F (36.8 C), temperature source Oral, resp. rate 18, height 5\' 4"  (1.626 m), weight 70.308 kg (155 lb), SpO2 99.00%. Physical Exam  Vitals reviewed. Constitutional: She is oriented to person, place, and time.  Frail 80 year old female.  HENT:  Head: Normocephalic.  Eyes: EOM are normal.  Neck: Normal range of motion. Neck supple. No thyromegaly present.  Cardiovascular: Normal rate and regular rhythm.   Respiratory: Effort normal and breath sounds normal. No respiratory distress.  GI: Soft. Bowel sounds are normal. She exhibits no distension.  Neurological: She is alert and oriented to person, place, and time.  Follows simple commands. UE 4-prox to 4 distally. LE's 1 to 2- with LEFt HF, RIGHT HF 3-. KE 2- left, 2+ right, ankles 3/5. No sensory deficits, no ptosis, no dsyarthria, CN exam intact  Skin: Skin is warm and dry.  Psychiatric: She has a normal mood and affect. Her behavior is normal. Judgment and thought content normal.    Results for orders placed during the hospital encounter of 06/11/13 (from the past 24 hour(s))  BASIC METABOLIC PANEL      Status: Abnormal   Collection Time    06/17/13  4:45 AM      Result Value Range   Sodium 135 (*) 137 - 147 mEq/L   Potassium 4.6  3.7 - 5.3 mEq/L   Chloride 106  96 - 112 mEq/L   CO2 21  19 - 32 mEq/L   Glucose, Bld 83  70 - 99 mg/dL   BUN 23  6 - 23 mg/dL   Creatinine, Ser  0.82  0.50 - 1.10 mg/dL   Calcium 6.9 (*) 8.4 - 10.5 mg/dL   GFR calc non Af Amer 63 (*) >90 mL/min   GFR calc Af Amer 72 (*) >90 mL/min  CBC     Status: Abnormal   Collection Time    06/17/13  4:45 AM      Result Value Range   WBC 2.5 (*) 4.0 - 10.5 K/uL   RBC 2.74 (*) 3.87 - 5.11 MIL/uL   Hemoglobin 8.2 (*) 12.0 - 15.0 g/dL   HCT 25.1 (*) 36.0 - 46.0 %   MCV 91.6  78.0 - 100.0 fL   MCH 29.9  26.0 - 34.0 pg   MCHC 32.7  30.0 - 36.0 g/dL   RDW 15.7 (*) 11.5 - 15.5 %   Platelets 80 (*) 150 - 400 K/uL   Mr Lumbar Spine Wo Contrast  06/15/2013   CLINICAL DATA:  Worsening gait difficulty. Lower extremity numbness.  EXAM: MRI LUMBAR SPINE WITHOUT CONTRAST  TECHNIQUE: Multiplanar, multisequence MR imaging was performed. No intravenous contrast was administered.  COMPARISON:  CT scan of the abdomen and pelvis dated 03/22/2013  FINDINGS: Tip of the conus is at L1 appears normal. The distal thoracic spinal cord appears normal. There appears to be a small amount of ascites in the abdomen with small amount of fluid around the right kidney, nonspecific. Multiple bilateral renal cysts.  Slight lumbar scoliosis with convexity to the left centered at L3.  T10-11 through L1-2: Slight disc space narrowing with tiny broad-based disc bulge is at each level with no neural impingement.  L2-3: Marked disc space narrowing. Small broad-based disc bulge with accompanying osteophytes with minimal narrowing of the spinal canal. No neural impingement. Slight hypertrophy of the right ligamentum flavum.  L3-4: Right anterior lateral osteophytes fuse the L3-4 level. Disc spaces markedly narrowed. Small broad-based osteophytes minimally narrow the  spinal canal. Slight hypertrophy of the right ligamentum flavum creates slight narrowing of the right lateral recess without neural impingement.  L4-5:  The vertebral bodies are fused.  No neural impingement.  L5-S1: Slight disc space narrowing with a tiny broad-based disc bulge with accompanying osteophytes. Slight degenerative changes of the facet joints with narrowing of the left lateral recess without impingement upon the exiting left S1 nerve.  IMPRESSION: 1. No neural impingement in the lumbar spine or lower thoracic spine. 2. Distal thoracic spinal cord appears normal. 3. Multilevel degenerative disc disease. 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   Electronically Signed   By: Rozetta Nunnery M.D.   On: 06/15/2013 10:40    Assessment/Plan: Diagnosis: ?myasthenia gravis 1. Does the need for close, 24 hr/day medical supervision in concert with the patient's rehab needs make it unreasonable for this patient to be served in a less intensive setting? Yes 2. Co-Morbidities requiring supervision/potential complications: anxiety, dvt, afib 3. Due to bladder management, bowel management, safety, skin/wound care, disease management, medication administration, pain management and patient education, does the patient require 24 hr/day rehab nursing? Yes 4. Does the patient require coordinated care of a physician, rehab nurse, PT (1-2 hrs/day, 5 days/week) and OT (1-2 hrs/day, 5 days/week) to address physical and functional deficits in the context of the above medical diagnosis(es)? Yes Addressing deficits in the following areas: balance, endurance, locomotion, strength, transferring, bowel/bladder control, bathing, dressing, feeding, grooming, toileting and psychosocial support 5. Can the patient actively participate in an intensive therapy program of at least 3 hrs of therapy per day at least 5 days per  week? Yes 6. The potential for patient to make measurable gains while on inpatient rehab is  excellent 7. Anticipated functional outcomes upon discharge from inpatient rehab are supervision  with PT, supervision to min assist with OT, n/a with SLP. 8. Estimated rehab length of stay to reach the above functional goals is: 15-20 days 9. Does the patient have adequate social supports to accommodate these discharge functional goals? Yes and Potentially 10. Anticipated D/C setting: Home 11. Anticipated post D/C treatments: [place] therapy 12. Overall Rehab/Functional Prognosis: good  RECOMMENDATIONS: This patient's condition is appropriate for continued rehabilitative care in the following setting: CIR Patient has agreed to participate in recommended program. Yes Note that insurance prior authorization may be required for reimbursement for recommended care.  Comment: Will follow along. Would like to bring her back to inpatient rehab as she's a very hard worker and family has been supportive in the past. Rehab Admissions Coordinator to follow up.  Thanks,  [name], MD, Mellody Drown     06/17/2013

## 2013-06-17 NOTE — Progress Notes (Signed)
Physical Therapy Treatment Patient Details Name: [name] MRN: [id number] DOB: [birth year] Today's Date: 06/17/2013 Time: 6761-9509 PT Time Calculation (min): 16 min  PT Assessment / Plan / Recommendation  History of Present Illness The patient is a 80 y.o. year-old female with history of myasthenia gravis on cellcept, uterine cancer, DVT s/p IVC filater because of GIB, hypothyroidism, NICM, PAF, anxiety.  Last fall, [name] was hospitalized for incarcerated hernia s/p surgical repair with concomitant cholecystectomy, course complicated by type 2 NSTEMI with cardiomyopathy and SBO.  She has also had CMV colitis and was taken off of her prednisone which had been used for her MG.  She has been living at home with her daughter and son-in-law and completing her ADLs with minimal assistance, using a walker for ambulation.  Two days ago, she was at her baseline.  Pt then began experiencing rapid weakness and was adm to [place] then transferred to [facility].    PT Comments   Patient making slow gains with mobility and gait.  Follow Up Recommendations  CIR     Does the patient have the potential to tolerate intense rehabilitation     Barriers to Discharge        Equipment Recommendations  Wheelchair (measurements PT);Wheelchair cushion (measurements PT)    Recommendations for Other Services Rehab consult  Frequency Min 3X/week   Progress towards PT Goals Progress towards PT goals: Progressing toward goals  Plan Current plan remains appropriate    Precautions / Restrictions Precautions Precautions: Fall Restrictions Weight Bearing Restrictions: No   Pertinent Vitals/Pain Lt hip pain continues    Mobility  Bed Mobility Overal bed mobility: Needs Assistance Bed Mobility: Supine to Sit Supine to sit: Min assist;HOB elevated General bed mobility comments: Verbal cues for technique.  Assist to move LLE off of bed.  Assist to raise trunk to sitting.  Good sitting balance once  upright. Transfers Overall transfer level: Needs assistance Equipment used: Rolling walker (2 wheeled) Transfers: Sit to/from Stand Sit to Stand: Mod assist;+2 physical assistance General transfer comment: Verbal cues for hand placement.  Assist to rise to standing and for balance.  Patient stood x 30 seconds to gain balance and decrease lightheadedness.  Cues for hand placement and assist to control descent to chair. Ambulation/Gait Ambulation/Gait assistance: Mod assist;+2 physical assistance Ambulation Distance (Feet): 8 Feet Assistive device: Rolling walker (2 wheeled) Gait Pattern/deviations: Step-through pattern;Decreased step length - right;Decreased step length - left;Decreased weight shift to left;Shuffle;Trunk flexed Gait velocity interpretation: Below normal speed for age/gender General Gait Details: Verbal cues for safe use of RW and gait sequence.  Patient able to maneuver RW straight ahead.  Able to ambulate 8' with encouragement.  Fatigued quickly.      PT Goals (current goals can now be found in the care plan section)    Visit Information  Last PT Received On: 06/17/13 Assistance Needed: +2 History of Present Illness: The patient is a 80 y.o. year-old female with history of myasthenia gravis on cellcept, uterine cancer, DVT s/p IVC filater because of GIB, hypothyroidism, NICM, PAF, anxiety.  Last fall, [name] was hospitalized for incarcerated hernia s/p surgical repair with concomitant cholecystectomy, course complicated by type 2 NSTEMI with cardiomyopathy and SBO.  She has also had CMV colitis and was taken off of her prednisone which had been used for her MG.  She has been living at home with her daughter and son-in-law and completing her ADLs with minimal assistance, using a walker for ambulation.  Two  days ago, she was at her baseline.  Pt then began experiencing rapid weakness and was adm to [place] then transferred to [facility].     Subjective Data  Subjective: "My  hip still hurts"   Cognition  Cognition Arousal/Alertness: Awake/alert Behavior During Therapy: WFL for tasks assessed/performed Overall Cognitive Status: Within Functional Limits for tasks assessed    Balance  Balance Overall balance assessment: Needs assistance Sitting-balance support: No upper extremity supported;Feet supported Sitting balance-Leahy Scale: Fair Standing balance support: Bilateral upper extremity supported Standing balance-Leahy Scale: Fair  End of Session PT - End of Session Equipment Utilized During Treatment: Gait belt Activity Tolerance: Patient limited by fatigue;Patient limited by pain Patient left: in chair;with call bell/phone within reach Nurse Communication: Mobility status (Patient in chair.)   GP     [name] 06/17/2013, 4:35 PM [name]. [name], [name] Pager [phone]

## 2013-06-17 NOTE — Progress Notes (Signed)
Subjective: No significant changes  Exam: Filed Vitals:   06/17/13 1407  BP: 102/61  Pulse: 62  Temp: 97.5 F (36.4 C)  Resp: 18   Gen: In bed, NAD MS: Awake, alert, oriented x 3  BC:WUGQB, eomi Motor: able to give good strength Sensory: decreased to temp to hips.  Vibration is decreased in the toes and knees, but she does have some vibratory sensation now.  VQX:IHWTU in UE, difficult to ellicit ankle jerks. Knee reflex present bilaterally.    Impression: 80 yo F with worsening gait difficulty and LE numbness. Possibilities include myasthenia causing LE fatiguability coupled with an underlying neuropathy, GBS, B12 causing posterior column dysfunciton/neuropathy. Her LE weakness could certainly be related to her DVTs and pain, however I think that thsi would not explain her marked decrease to vibration earlier in the hospitalization or the improvement in this finding since IVIG was started.   With improvement in vibratory sensation, would consider GBS much more strongly.   Recommendations: 1) copper pending 2) PT/OT 3) IVIG day 4/5   Roland Rack, MD [facility] [phone]  If 7pm- 7am, please page neurology on call at [phone].

## 2013-06-17 NOTE — Progress Notes (Signed)
[facility] PROGRESS NOTE  [name] [id number] DOB: [birth year] DOA: 06/11/2013 PCP: Laurey Morale, MD  Assessment/Plan: Progressive weakness and lower extremity numbness.   - TSH ok and cortisol level ok - Continue to monitor for symptoms of underlying infection  - q4h neuro checks  - Neurology consultation - MRI done- ordered for lumbar spine as well-   LP 1/23- suspect GB as patient has improved with IVIG - PT/OT - CIR?  - Air mattress  - started on IVIG- day 4/5  DVT- -appreciate oncology -poor candidate for anticoagulation Has filter- place in 2014  Leukopenia/thrombocytopenia -WBC improved  Myasthenia Gravis with progressive weakness  - Continue cellcept  For now - Neurology consultation  - Monitor for evidence of respiratory distress  -incentive spirometry  PAF, not on A/C secondary to GIB, currently NSR   HTN/HLD, BP stable, hold statin in acute setting and continue BB   ICM, weight stable leg swelling and weight loss. CXR without interstitial edema. Hold lasix  Lower extremity edema, stable and likely due to low albumin, although she also has hx of DVT (has IVC filter)  - TED hose, elevate when able   CKD stage II/III, stable. Minimize nephrotoxins and renally dose medications   Hypothyroidism, continue synthroid   Anxiety, stable. 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 Defer to PCP   Thrombocytopenia, new onset, likely acute phase reactant   Progressive weight loss likely  - Start prn phenergan  - consider appetite stimulant once acute problem has been further investigated   Severe protein-calorie malnutrition with hypoalbuminemia and weight loss due to anorexia with nausea  - Nutrition consultation  - Dietary supplements  - Liberalized diet   Sacral decubitus ulcer  - Wound care consult  - Air mattress   Hx of uterine CA in  remission           Code Status: DNR Family Communication: patient Disposition Plan: ?CIR   Consultants:  neuro  Procedures:    Antibiotics:    HPI/Subjective: No further nausea Feeling better  Objective: Filed Vitals:   06/17/13 0938  BP: 115/64  Pulse: 61  Temp: 98 F (36.7 C)  Resp: 18   No intake or output data in the 24 hours ending 06/17/13 1256 Filed Weights   06/11/13 1900  Weight: 70.308 kg (155 lb)    Exam:   General:  Pleasant/cooperative  Cardiovascular: rrr  Respiratory: clear anterior  Abdomen: +BS, soft  Musculoskeletal: moves all 4 ext - weak LE  Data Reviewed: Basic Metabolic Panel:  Recent Labs Lab 06/13/13 0550 06/14/13 0450 06/15/13 0500 06/16/13 0500 06/17/13 0445  NA 139 140 137 134* 135*  K 4.6 4.2 4.6 4.5 4.6  CL 111 112 110 107 106  CO2 21 21 21 20 21   GLUCOSE 99 86 105* 104* 83  BUN 24* 21 29* 24* 23  CREATININE 1.05 0.89 0.93 0.82 0.82  CALCIUM 7.4* 7.3* 7.1* 7.0* 6.9*   Liver Function Tests:  Recent Labs Lab 06/11/13 1150  AST 20  ALT 15  ALKPHOS 75  BILITOT 0.6  PROT 3.8*  ALBUMIN 1.9*   No results found for this basename: LIPASE, AMYLASE,  in the last 168 hours No results found for this basename: AMMONIA,  in the last 168 hours CBC:  Recent Labs Lab 06/13/13 0550 06/14/13 0450 06/15/13 0500 06/16/13 0500 06/17/13 0445  WBC 4.1 3.4* 1.9* 2.7* 2.5*  NEUTROABS  --   --   --  2.1  --   HGB 8.5* 8.9* 8.5* 8.8* 8.2*  HCT 26.3* 26.5* 25.9* 26.4* 25.1*  MCV 92.9 92.0 91.8 91.3 91.6  PLT 67* 67* 69* 79* 80*   Cardiac Enzymes:  Recent Labs Lab 06/11/13 1150 06/11/13 1800  CKTOTAL 26  --   TROPONINI <0.30 <0.30   BNP (last 3 results)  Recent Labs  12/08/12 0026 03/27/13 0120  PROBNP 465.5* 5592.0*   CBG:  Recent Labs Lab 06/11/13 1046  GLUCAP 83    Recent Results (from the past 240 hour(s))  URINE CULTURE     Status: None   Collection Time    06/11/13 12:23 PM       Result Value Range Status   Specimen Description URINE, CLEAN CATCH   Final   Special Requests NONE   Final   Culture  Setup Time     Final   Value: 06/11/2013 16:38     Performed at [facility]     Final   Value: 30,000 COLONIES/ML     Performed at [facility]   Culture     Final   Value: Multiple bacterial morphotypes present, none predominant. Suggest appropriate recollection if clinically indicated.     Performed at [facility]   Report Status 06/12/2013 FINAL   Final     Studies: No results found.  Scheduled Meds: . aspirin  81 mg Oral Daily  . atorvastatin  20 mg Oral Q supper  . buPROPion  150 mg Oral Q breakfast  . feeding supplement (ENSURE COMPLETE)  237 mL Oral BID BM  . ferrous sulfate  325 mg Oral Q breakfast  . Immune Globulin 5%  400 mg/kg Intravenous Q24 Hr x 5  . levothyroxine  125 mcg Oral QAC breakfast  . loperamide  2 mg Oral Daily  . loperamide  4 mg Oral QHS  . multivitamin with minerals  1 tablet Oral Daily  . mycophenolate  1,000 mg Oral BID  . nutrition supplement (JUVEN)  1 packet Oral BID BM  . pantoprazole  40 mg Oral Daily  . potassium chloride  20 mEq Oral BID  . sodium chloride  3 mL Intravenous Q12H   Continuous Infusions:   Principal Problem:   Proximal muscle weakness Active Problems:   HYPOTHYROIDISM   ANXIETY   Anemia of chronic disease   Protein-calorie malnutrition, severe   Pedal edema   Cardiomyopathy   Weakness   Numbness of legs    Time spent: 35 min    Shadee Rathod  [facility] Pager [phone]. If 7PM-7AM, please contact night-coverage at [email or web address], password [facility] 06/17/2013, 12:56 PM  LOS: 6 days

## 2013-06-18 LAB — CBC
HCT: 27.4 % — ABNORMAL LOW (ref 36.0–46.0)
Hemoglobin: 9.1 g/dL — ABNORMAL LOW (ref 12.0–15.0)
MCH: 30.3 pg (ref 26.0–34.0)
MCHC: 33.2 g/dL (ref 30.0–36.0)
MCV: 91.3 fL (ref 78.0–100.0)
PLATELETS: 95 10*3/uL — AB (ref 150–400)
RBC: 3 MIL/uL — AB (ref 3.87–5.11)
RDW: 15.6 % — ABNORMAL HIGH (ref 11.5–15.5)
WBC: 2.4 10*3/uL — ABNORMAL LOW (ref 4.0–10.5)

## 2013-06-18 LAB — BASIC METABOLIC PANEL
BUN: 21 mg/dL (ref 6–23)
CALCIUM: 7 mg/dL — AB (ref 8.4–10.5)
CO2: 20 mEq/L (ref 19–32)
Chloride: 105 mEq/L (ref 96–112)
Creatinine, Ser: 0.85 mg/dL (ref 0.50–1.10)
GFR calc Af Amer: 69 mL/min — ABNORMAL LOW (ref >90)
GFR, EST NON AFRICAN AMERICAN: 60 mL/min — AB (ref >90)
Glucose, Bld: 97 mg/dL (ref 70–99)
POTASSIUM: 4.7 meq/L (ref 3.7–5.3)
Sodium: 132 mEq/L — ABNORMAL LOW (ref 137–147)

## 2013-06-18 LAB — HEPARIN INDUCED THROMBOCYTOPENIA PNL
Heparin Induced Plt Ab: NEGATIVE
Patient O.D.: 0.128
UFH HIGH DOSE UFH H: 5 %
UFH Low Dose 0.1 IU/mL: 1 % Release
UFH Low Dose 0.5 IU/mL: 2 % Release
UFH SRA RESULT: NEGATIVE

## 2013-06-18 LAB — COPPER, SERUM: Copper: 60 ug/dL — ABNORMAL LOW (ref 70–175)

## 2013-06-18 MED ORDER — BOOST / RESOURCE BREEZE PO LIQD
1.0000 | ORAL | Status: DC
  Administered 2013-06-19: 1 via ORAL

## 2013-06-18 MED ORDER — ENSURE PUDDING PO PUDG
1.0000 | ORAL | Status: DC
  Administered 2013-06-18: 1 via ORAL

## 2013-06-18 MED ORDER — METOCLOPRAMIDE HCL 5 MG/ML IJ SOLN
5.0000 mg | Freq: Once | INTRAMUSCULAR | Status: AC
  Administered 2013-06-18: 5 mg via INTRAVENOUS
  Filled 2013-06-18: qty 1

## 2013-06-18 NOTE — Progress Notes (Signed)
Subjective: No significant changes  Exam: Filed Vitals:   06/18/13 1700  BP: 125/66  Pulse: 73  Temp: 98.2 F (36.8 C)  Resp: 20   Gen: In bed, NAD MS: Awake, alert, oriented x 3  YB:WLSLH, eomi Motor: able to give good strength Sensory: decreased to temp to hips.  Vibration is almost intact at the knees  TDS:KAJGO in UE, difficult to ellicit ankle jerks. Knee reflex present bilaterally.    Impression: 80 yo F with worsening gait difficulty and LE numbness. Possibilities include myasthenia causing LE fatiguability coupled with an underlying neuropathy, GBS, B12 causing posterior column dysfunciton/neuropathy. Her LE weakness could certainly be related to her DVTs and pain, however I think that thsi would not explain her marked decrease to vibration earlier in the hospitalization or the improvement in this finding since IVIG was started. Her vibration today is markedly improved, almost back to normal.  With improvement in vibratory sensation, I strongly suspect Guillain-Barr syndrome as her presenting illness..   Recommendations: 1) continue with physical therapy and occupational therapy,  2) no further recommendations at this time as the patient has completed IVIG. Neurology will sign off at this time, please call with any further questions.  Roland Rack, MD [facility] [phone]  If 7pm- 7am, please page neurology on call at [phone].

## 2013-06-18 NOTE — Clinical Documentation Improvement (Signed)
PLEASE NOTE IF PRESENT ON ADMISSION  Sacral decubitus ulcer noted in Nutritional Consult.   Please clarify if Present on Admission and the stage of the decubiti if known!   Stage  I  Pressure Ulcer   (reddening of the skin) Stage  II Pressure Ulcer  (blister open or unopened) Stage  III Pressure Ulcer (through all layers skin) Stage IV Pressure Ulcer   (through skin & underlying  muscle, tendons, and bones)   Thank You, Zoila Shutter ,RN Clinical Documentation Specialist:  [phone]  Cell: [place] Information Management

## 2013-06-18 NOTE — Progress Notes (Signed)
Physical Therapy Treatment Patient Details Name: [name] MRN: [id number] DOB: April 22, 1925 Today's Date: 06/18/2013 Time: 4536-4680 PT Time Calculation (min): 40 min  PT Assessment / Plan / Recommendation  History of Present Illness The patient is a 81 y.o. year-old female with history of myasthenia gravis on cellcept, uterine cancer, DVT s/p IVC filater because of GIB, hypothyroidism, NICM, PAF, anxiety.  Last fall, [name] was hospitalized for incarcerated hernia s/p surgical repair with concomitant cholecystectomy, course complicated by type 2 NSTEMI with cardiomyopathy and SBO.  She has also had CMV colitis and was taken off of her prednisone which had been used for her MG.  She has been living at home with her daughter and son-in-law and completing her ADLs with minimal assistance, using a walker for ambulation.  Two days ago, she was at her baseline.  Pt then began experiencing rapid weakness and was adm to [place] then transferred to [facility].    PT Comments   Pt able to progress to mod A, no longer requires +2.  Pt requires frequent rests but increasing strength and functional transfers.  Follow Up Recommendations  CIR     Does the patient have the potential to tolerate intense rehabilitation     Barriers to Discharge        Equipment Recommendations  Other (comment) (TBD)    Recommendations for Other Services    Frequency Min 3X/week   Progress towards PT Goals Progress towards PT goals: Progressing toward goals  Plan Current plan remains appropriate    Precautions / Restrictions Precautions Precautions: Fall Restrictions Weight Bearing Restrictions: No   Pertinent Vitals/Pain Pt c/o back pain when reaching, eases with rest    Mobility  Bed Mobility Bed Mobility: Supine to Sit Supine to sit: Min assist;HOB elevated General bed mobility comments: assist for LEs out of bed Transfers Overall transfer level: Needs assistance Equipment used: None Transfers:  Squat Pivot Transfers Squat pivot transfers: Mod assist General transfer comment: Performed squat pivot transfers bed to [facility] and BSC to recliner with mod A, cues for hand placement, facilitation for wt shifting.  Pt requires only 1 person assist using this technique    Exercises     PT Diagnosis:    PT Problem List:   PT Treatment Interventions:     PT Goals (current goals can now be found in the care plan section)    Visit Information  Last PT Received On: 06/18/13 Assistance Needed: +1 History of Present Illness: The patient is a 80 y.o. year-old female with history of myasthenia gravis on cellcept, uterine cancer, DVT s/p IVC filater because of GIB, hypothyroidism, NICM, PAF, anxiety.  Last fall, [name] was hospitalized for incarcerated hernia s/p surgical repair with concomitant cholecystectomy, course complicated by type 2 NSTEMI with cardiomyopathy and SBO.  She has also had CMV colitis and was taken off of her prednisone which had been used for her MG.  She has been living at home with her daughter and son-in-law and completing her ADLs with minimal assistance, using a walker for ambulation.  Two days ago, she was at her baseline.  Pt then began experiencing rapid weakness and was adm to [place] then transferred to [facility].     Subjective Data      Cognition  Cognition Arousal/Alertness: Awake/alert Behavior During Therapy: WFL for tasks assessed/performed Overall Cognitive Status: Within Functional Limits for tasks assessed    Balance  Balance Overall balance assessment: Independent Sitting-balance support: Feet supported Sitting balance -  Comments: pt able to sit on BSC x 10 minutes without assist Standing balance support: During functional activity Standing balance comment: pt able to stand for hygiene after using BSC with min A, c/o back pain with reaching to wipe but eases with rest.  End of Session PT - End of Session Activity Tolerance: Patient tolerated  treatment well Patient left: in chair;with call bell/phone within reach Nurse Communication: Mobility status   GP     [name] 06/18/2013, 9:34 AM

## 2013-06-18 NOTE — Progress Notes (Signed)
Patient got nauseous this morning after being repositioned in bed for breakfast and after ingesting her 8 am pills, no vomit noted however patient was spitting out copious amounts of clear saliva. PT then got patient to the [facility] for bowel movement then to chair. Patient stating that nausea medicine does not work well with her. Per patient, nausea had gotten better while on BSC. Will cont to monitor

## 2013-06-18 NOTE — Progress Notes (Signed)
Rehab admissions - Evaluated for possible admission.  I met with patient.  Patient well known to me from previous rehab admissions.  She would like to come back to inpatient rehab.  I will follow up in am to see if patient is medically ready.  Call me for questions.  #[phone]

## 2013-06-18 NOTE — Progress Notes (Signed)
Patient back to bed with 2 assist and walker, bed alarm set, continuous pulse ox applied, TED hose applied. Call bell in reach, will cont to monitor

## 2013-06-18 NOTE — Progress Notes (Signed)
[facility] PROGRESS NOTE  [name] [id number] DOB: 1925-02-15 DOA: 06/11/2013 PCP: Laurey Morale, MD  The patient is a 80 y.o. year-old female with history of myasthenia gravis on cellcept, uterine cancer, DVT s/p IVC filater because of GIB, hypothyroidism, NICM, PAF, anxiety. Last fall, [name] was hospitalized for incarcerated hernia s/p surgical repair with concomitant cholecystectomy, course complicated by type 2 NSTEMI with cardiomyopathy and SBO. She has also had CMV colitis and was taken off of her prednisone which had been used for her MG. She has been living at home with her daughter and son-in-law and completing her ADLs with minimal assistance, using a walker for ambulation. Two days ago, she was at her baseline.  Yesterday morning, she was getting around normally, however, over the day yesterday, she developed difficulty getting off the toilet, then later difficulty getting up from the bed and finally she was unable to sit up at all without assistance. She also complained of numbness which progressed to the level of the knee. She denies focal weakness, although she states her legs are weaker than her arms. Her numbness is bilateral. She denies confusion, slurred speech, facial droop, seizures, syncope. Her MG has flared in the past, but usually her symptoms start with dysphagia and she has not had any dysphagia in the last day. She has not had fevers, chills, sore throat, cough. She has chronic nausea and anorexia and has lost about 10-lbs in the last month or so. She has NBNB emesis occasionally. She has chronic diarrhea about 2 times per day, applesauce--like or slightly thicker consistency, which has remained unchanged. Denies dysuria. She has had some muscle cramps recently. Her family called EMS to transport her to the ER because they could not lift her out of bed.   Patient was given IVIG and diagnosis of [name] was given  Assessment/Plan: Progressive  weakness and lower extremity numbness- presumed GB   - TSH ok and cortisol level ok - Continue to monitor for symptoms of underlying infection  - q4h neuro checks  - Neurology consultation - MRI done- ordered for lumbar spine as well-   LP 1/23- suspect GB as patient has improved with IVIG - PT/OT - CIR in AM? - Air mattress  -  IVIG- day 5/5- presumed GB  Nausea -no GB -add reglan x 1 dose to see if improves  DVT- -appreciate oncology -poor candidate for anticoagulation Has filter- place in 2014  Leukopenia/thrombocytopenia -WBC improved/plts improved  Myasthenia Gravis with progressive weakness  - Continue cellcept  For now - Neurology consultation  - Monitor for evidence of respiratory distress  -incentive spirometry  PAF, not on A/C secondary to GIB, currently NSR   HTN/HLD, BP stable, hold statin in acute setting and continue BB   ICM, weight stable leg swelling and weight loss. CXR without interstitial edema. Hold lasix  Lower extremity edema, stable and likely due to low albumin, although she also has hx of DVT (has IVC filter)  - TED hose, elevate when able   CKD stage II/III, stable. Minimize nephrotoxins and renally dose medications   Hypothyroidism, continue synthroid   Anxiety, stable. 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 Defer to PCP   Thrombocytopenia, new onset, likely acute phase reactant   Progressive weight loss likely  - Start prn phenergan  - consider appetite stimulant once acute problem has been further investigated   Severe protein-calorie  malnutrition with hypoalbuminemia and weight loss due to anorexia with nausea  - Nutrition consultation  - Dietary supplements  - Liberalized diet   Sacral decubitus ulcer-stage 2 - Wound care consult  - Air mattress   Hx of uterine CA in remission           Code Status: DNR Family  Communication: patient/daughter on phone yesterday Disposition Plan: ?CIR once nausea resolves   Consultants:  Neuro  CIR  oncology  Procedures:  LP  Antibiotics:    HPI/Subjective: Vomiting again this AM No abd pain No SOB  Objective: Filed Vitals:   06/18/13 0900  BP: 120/81  Pulse: 71  Temp: 98 F (36.7 C)  Resp: 18    Intake/Output Summary (Last 24 hours) at 06/18/13 1243 Last data filed at 06/18/13 0900  Gross per 24 hour  Intake    360 ml  Output      0 ml  Net    360 ml   Filed Weights   06/11/13 1900 06/17/13 2017  Weight: 70.308 kg (155 lb) 66.9 kg (147 lb 7.8 oz)    Exam:   General:  Pleasant/cooperative  Cardiovascular: rrr  Respiratory: clear anterior  Abdomen: +BS, soft  Musculoskeletal: moves all 4 ext - weak LE  Data Reviewed: Basic Metabolic Panel:  Recent Labs Lab 06/14/13 0450 06/15/13 0500 06/16/13 0500 06/17/13 0445 06/18/13 0500  NA 140 137 134* 135* 132*  K 4.2 4.6 4.5 4.6 4.7  CL 112 110 107 106 105  CO2 21 21 20 21 20   GLUCOSE 86 105* 104* 83 97  BUN 21 29* 24* 23 21  CREATININE 0.89 0.93 0.82 0.82 0.85  CALCIUM 7.3* 7.1* 7.0* 6.9* 7.0*   Liver Function Tests: No results found for this basename: AST, ALT, ALKPHOS, BILITOT, PROT, ALBUMIN,  in the last 168 hours No results found for this basename: LIPASE, AMYLASE,  in the last 168 hours No results found for this basename: AMMONIA,  in the last 168 hours CBC:  Recent Labs Lab 06/14/13 0450 06/15/13 0500 06/16/13 0500 06/17/13 0445 06/18/13 0500  WBC 3.4* 1.9* 2.7* 2.5* 2.4*  NEUTROABS  --   --  2.1  --   --   HGB 8.9* 8.5* 8.8* 8.2* 9.1*  HCT 26.5* 25.9* 26.4* 25.1* 27.4*  MCV 92.0 91.8 91.3 91.6 91.3  PLT 67* 69* 79* 80* 95*   Cardiac Enzymes:  Recent Labs Lab 06/11/13 1800  TROPONINI <0.30   BNP (last 3 results)  Recent Labs  12/08/12 0026 03/27/13 0120  PROBNP 465.5* 5592.0*   CBG: No results found for this basename: GLUCAP,  in  the last 168 hours  Recent Results (from the past 240 hour(s))  URINE CULTURE     Status: None   Collection Time    06/11/13 12:23 PM      Result Value Range Status   Specimen Description URINE, CLEAN CATCH   Final   Special Requests NONE   Final   Culture  Setup Time     Final   Value: 06/11/2013 16:38     Performed at [place]     Final   Value: 30,000 COLONIES/ML     Performed at [facility]   Culture     Final   Value: Multiple bacterial morphotypes present, none predominant. Suggest appropriate recollection if clinically indicated.     Performed at [facility]   Report Status 06/12/2013 FINAL   Final  Studies: No results found.  Scheduled Meds: . aspirin  81 mg Oral Daily  . atorvastatin  20 mg Oral Q supper  . buPROPion  150 mg Oral Q breakfast  . feeding supplement (ENSURE COMPLETE)  237 mL Oral BID BM  . ferrous sulfate  325 mg Oral Q breakfast  . Immune Globulin 5%  400 mg/kg Intravenous Q24 Hr x 5  . levothyroxine  125 mcg Oral QAC breakfast  . loperamide  2 mg Oral Daily  . loperamide  4 mg Oral QHS  . metoCLOPramide (REGLAN) injection  5 mg Intravenous Once  . multivitamin with minerals  1 tablet Oral Daily  . mycophenolate  1,000 mg Oral BID  . nutrition supplement (JUVEN)  1 packet Oral BID BM  . pantoprazole  40 mg Oral Daily  . potassium chloride  20 mEq Oral BID  . sodium chloride  3 mL Intravenous Q12H   Continuous Infusions:   Principal Problem:   Proximal muscle weakness Active Problems:   HYPOTHYROIDISM   ANXIETY   Anemia of chronic disease   Protein-calorie malnutrition, severe   Pedal edema   Cardiomyopathy   Weakness   Numbness of legs    Time spent: 35 min    Taiwan Talcott  [facility] Pager [phone]. If 7PM-7AM, please contact night-coverage at [email or web address], password [facility] 06/18/2013, 12:43 PM  LOS: 7 days

## 2013-06-18 NOTE — Progress Notes (Signed)
Hematology  Her plts continue to trend upward.  Her mild leukopenia and anemia may also be partially explained by her medications.  We will sign off.  Please call with additional questions.  Concha Norway, MD.

## 2013-06-18 NOTE — Progress Notes (Signed)
NUTRITION FOLLOW UP  Intervention:   Continue Ensure Complete BID Provide Ensure Pudding once daily Provide Resource Breeze once daily Continue Multivitamin with minerals daily Discontinue Juven  Nutrition Dx:   Predicted suboptimal energy intake related to poor appetite as evidenced by patient dietary recall and weight loss; ongoing  Goal:   Pt to meet >/= 90% of their estimated nutrition needs; not met  Monitor:   Weight trends; additional 8 lb weight loss since admission Labs; low sodium, low calcium, low GFR, low hemoglobin I/O's: +2 L PO intake; varies from 0-40% to 70-80% Supplement intake; 1 to 2 Ensure Complete daily  Assessment:   Patient is an 80 y.o. Female with PMH of myasthenia gravis on cellcept, uterine cancer, DVT s/p IVC filater because of GIB hypothyroidism, NICM, PAF, anxiety, anemia, and vitamin B12 deficiency. Patient was admitted to [facility] ED on 1/20 complaining of progressive muscle weakness over the previous 3 days. Patient was transferred to [facility] later on 1/20.  Initial assessment 1/21 pt met criteria for severe malnutrition in the context of chronic illness.  Pt reports having nausea yesterday and today with one episode of vomiting. She refused breakfast this morning ate some applesauce and half of an Ensure Complete for lunch. Per nursing notes, pt's PO intake varies from 0-40% of meals to 70-80%. Pt reports ongoing poor appetite and is willing to try additional supplements. Encouraged PO intake and snacking.    Height: Ht Readings from Last 1 Encounters:  06/11/13 5' 4" (1.626 m)    Weight Status:   Wt Readings from Last 1 Encounters:  06/17/13 147 lb 7.8 oz (66.9 kg)    Re-estimated needs:  Kcal: 1700-1900  Protein: 85-95 grams  Fluid: 1.7-1.9 L  Skin: +1 generalized edema; stage 2 pressure ulcer to perineum  Diet Order: Dysphagia   Intake/Output Summary (Last 24 hours) at 06/18/13 1514 Last data filed at 06/18/13 0900  Gross  per 24 hour  Intake    360 ml  Output      0 ml  Net    360 ml    Last BM: 1/27   Labs:   Recent Labs Lab 06/16/13 0500 06/17/13 0445 06/18/13 0500  NA 134* 135* 132*  K 4.5 4.6 4.7  CL 107 106 105  CO2 _0 BUN 24* 23 21  CREATININE 0.82 0.82 0.85  CALCIUM 7.0* 6.9* 7.0*  GLUCOSE 104* 83 97    CBG (last 3)  No results found for this basename: GLUCAP,  in the last 72 hours  Scheduled Meds: . aspirin  81 mg Oral Daily  . atorvastatin  20 mg Oral Q supper  . buPROPion  150 mg Oral Q breakfast  . feeding supplement (ENSURE COMPLETE)  237 mL Oral BID BM  . ferrous sulfate  325 mg Oral Q breakfast  . Immune Globulin 5%  400 mg/kg Intravenous Q24 Hr x 5  . levothyroxine  125 mcg Oral QAC breakfast  . loperamide  2 mg Oral Daily  . loperamide  4 mg Oral QHS  . multivitamin with minerals  1 tablet Oral Daily  . mycophenolate  1,000 mg Oral BID  . nutrition supplement (JUVEN)  1 packet Oral BID BM  . pantoprazole  40 mg Oral Daily  . potassium chloride  20 mEq Oral BID  . sodium chloride  3 mL Intravenous Q12H    Continuous Infusions:   Pryor Ochoa RD, LDN Inpatient Clinical Dietitian Pager: [phone] After Hours Pager:  [phone]

## 2013-06-18 NOTE — Progress Notes (Signed)
Nausea has continued this morning, and phenergan pill given. MD made aware. Patient said to me "are you going to make me eat today?" I said "I will not force you of course but if you could try to eat a little it could really help you." patient said "I dont want to this morning." Will cont to monitor.

## 2013-06-19 ENCOUNTER — Inpatient Hospital Stay (HOSPITAL_COMMUNITY)
Admission: RE | Admit: 2013-06-19 | Discharge: 2013-07-06 | DRG: 945 | Disposition: A | Discharge status: Hospice | Payer: Medicare Other | Source: Intra-hospital | Attending: Physical Medicine & Rehabilitation | Admitting: Physical Medicine & Rehabilitation

## 2013-06-19 DIAGNOSIS — R4181 Age-related cognitive decline: Secondary | ICD-10-CM | POA: Diagnosis present

## 2013-06-19 DIAGNOSIS — D638 Anemia in other chronic diseases classified elsewhere: Secondary | ICD-10-CM

## 2013-06-19 DIAGNOSIS — R110 Nausea: Secondary | ICD-10-CM | POA: Diagnosis not present

## 2013-06-19 DIAGNOSIS — E785 Hyperlipidemia, unspecified: Secondary | ICD-10-CM | POA: Diagnosis present

## 2013-06-19 DIAGNOSIS — L89109 Pressure ulcer of unspecified part of back, unspecified stage: Secondary | ICD-10-CM | POA: Diagnosis present

## 2013-06-19 DIAGNOSIS — Z515 Encounter for palliative care: Secondary | ICD-10-CM

## 2013-06-19 DIAGNOSIS — E039 Hypothyroidism, unspecified: Secondary | ICD-10-CM | POA: Diagnosis present

## 2013-06-19 DIAGNOSIS — F411 Generalized anxiety disorder: Secondary | ICD-10-CM | POA: Diagnosis present

## 2013-06-19 DIAGNOSIS — R627 Adult failure to thrive: Secondary | ICD-10-CM | POA: Diagnosis present

## 2013-06-19 DIAGNOSIS — E43 Unspecified severe protein-calorie malnutrition: Secondary | ICD-10-CM | POA: Diagnosis present

## 2013-06-19 DIAGNOSIS — Z7982 Long term (current) use of aspirin: Secondary | ICD-10-CM

## 2013-06-19 DIAGNOSIS — R197 Diarrhea, unspecified: Secondary | ICD-10-CM | POA: Diagnosis present

## 2013-06-19 DIAGNOSIS — I82509 Chronic embolism and thrombosis of unspecified deep veins of unspecified lower extremity: Secondary | ICD-10-CM | POA: Diagnosis present

## 2013-06-19 DIAGNOSIS — Z79899 Other long term (current) drug therapy: Secondary | ICD-10-CM

## 2013-06-19 DIAGNOSIS — I4891 Unspecified atrial fibrillation: Secondary | ICD-10-CM

## 2013-06-19 DIAGNOSIS — R443 Hallucinations, unspecified: Secondary | ICD-10-CM | POA: Diagnosis not present

## 2013-06-19 DIAGNOSIS — I252 Old myocardial infarction: Secondary | ICD-10-CM

## 2013-06-19 DIAGNOSIS — N390 Urinary tract infection, site not specified: Secondary | ICD-10-CM | POA: Diagnosis present

## 2013-06-19 DIAGNOSIS — N179 Acute kidney failure, unspecified: Secondary | ICD-10-CM

## 2013-06-19 DIAGNOSIS — Z8673 Personal history of transient ischemic attack (TIA), and cerebral infarction without residual deficits: Secondary | ICD-10-CM

## 2013-06-19 DIAGNOSIS — Z86718 Personal history of other venous thrombosis and embolism: Secondary | ICD-10-CM

## 2013-06-19 DIAGNOSIS — I824Y9 Acute embolism and thrombosis of unspecified deep veins of unspecified proximal lower extremity: Secondary | ICD-10-CM | POA: Diagnosis present

## 2013-06-19 DIAGNOSIS — G610 Guillain-Barre syndrome: Secondary | ICD-10-CM

## 2013-06-19 DIAGNOSIS — F329 Major depressive disorder, single episode, unspecified: Secondary | ICD-10-CM | POA: Diagnosis present

## 2013-06-19 DIAGNOSIS — M25559 Pain in unspecified hip: Secondary | ICD-10-CM | POA: Diagnosis present

## 2013-06-19 DIAGNOSIS — Z7901 Long term (current) use of anticoagulants: Secondary | ICD-10-CM

## 2013-06-19 DIAGNOSIS — K5190 Ulcerative colitis, unspecified, without complications: Secondary | ICD-10-CM

## 2013-06-19 DIAGNOSIS — D696 Thrombocytopenia, unspecified: Secondary | ICD-10-CM | POA: Diagnosis present

## 2013-06-19 DIAGNOSIS — I5022 Chronic systolic (congestive) heart failure: Secondary | ICD-10-CM | POA: Diagnosis present

## 2013-06-19 DIAGNOSIS — Z66 Do not resuscitate: Secondary | ICD-10-CM | POA: Diagnosis not present

## 2013-06-19 DIAGNOSIS — G7000 Myasthenia gravis without (acute) exacerbation: Secondary | ICD-10-CM | POA: Diagnosis present

## 2013-06-19 DIAGNOSIS — I509 Heart failure, unspecified: Secondary | ICD-10-CM | POA: Diagnosis present

## 2013-06-19 DIAGNOSIS — I824Z9 Acute embolism and thrombosis of unspecified deep veins of unspecified distal lower extremity: Secondary | ICD-10-CM | POA: Diagnosis present

## 2013-06-19 DIAGNOSIS — Z5189 Encounter for other specified aftercare: Principal | ICD-10-CM

## 2013-06-19 DIAGNOSIS — D649 Anemia, unspecified: Secondary | ICD-10-CM | POA: Diagnosis present

## 2013-06-19 DIAGNOSIS — F3289 Other specified depressive episodes: Secondary | ICD-10-CM | POA: Diagnosis present

## 2013-06-19 DIAGNOSIS — I429 Cardiomyopathy, unspecified: Secondary | ICD-10-CM

## 2013-06-19 DIAGNOSIS — L8990 Pressure ulcer of unspecified site, unspecified stage: Secondary | ICD-10-CM | POA: Diagnosis present

## 2013-06-19 DIAGNOSIS — I10 Essential (primary) hypertension: Secondary | ICD-10-CM | POA: Diagnosis present

## 2013-06-19 LAB — CBC
HCT: 25.9 % — ABNORMAL LOW (ref 36.0–46.0)
HEMOGLOBIN: 8.6 g/dL — AB (ref 12.0–15.0)
MCH: 30.3 pg (ref 26.0–34.0)
MCHC: 33.2 g/dL (ref 30.0–36.0)
MCV: 91.2 fL (ref 78.0–100.0)
Platelets: 84 10*3/uL — ABNORMAL LOW (ref 150–400)
RBC: 2.84 MIL/uL — AB (ref 3.87–5.11)
RDW: 15.7 % — ABNORMAL HIGH (ref 11.5–15.5)
WBC: 2.3 10*3/uL — ABNORMAL LOW (ref 4.0–10.5)

## 2013-06-19 LAB — BASIC METABOLIC PANEL
BUN: 21 mg/dL (ref 6–23)
CO2: 20 meq/L (ref 19–32)
Calcium: 6.9 mg/dL — ABNORMAL LOW (ref 8.4–10.5)
Chloride: 107 mEq/L (ref 96–112)
Creatinine, Ser: 0.83 mg/dL (ref 0.50–1.10)
GFR calc Af Amer: 71 mL/min — ABNORMAL LOW (ref >90)
GFR, EST NON AFRICAN AMERICAN: 62 mL/min — AB (ref >90)
Glucose, Bld: 119 mg/dL — ABNORMAL HIGH (ref 70–99)
Potassium: 4.4 mEq/L (ref 3.7–5.3)
SODIUM: 134 meq/L — AB (ref 137–147)

## 2013-06-19 MED ORDER — METOCLOPRAMIDE HCL 5 MG PO TABS: 5.0000 mg | ORAL_TABLET | Freq: Three times a day (TID) | ORAL | Status: AC

## 2013-06-19 MED ORDER — FERROUS SULFATE 325 (65 FE) MG PO TABS
325.0000 mg | ORAL_TABLET | Freq: Every day | ORAL | Status: DC
  Administered 2013-06-20 – 2013-06-23 (×4): 325 mg via ORAL
  Filled 2013-06-19 (×5): qty 1

## 2013-06-19 MED ORDER — ENSURE COMPLETE PO LIQD
237.0000 mL | Freq: Two times a day (BID) | ORAL | Status: DC
  Administered 2013-06-20 – 2013-06-23 (×2): 237 mL via ORAL

## 2013-06-19 MED ORDER — PANTOPRAZOLE SODIUM 40 MG PO TBEC
40.0000 mg | DELAYED_RELEASE_TABLET | Freq: Every day | ORAL | Status: DC
  Administered 2013-06-20 – 2013-06-23 (×4): 40 mg via ORAL
  Filled 2013-06-19 (×5): qty 1

## 2013-06-19 MED ORDER — BISACODYL 10 MG RE SUPP: 10.0000 mg | Freq: Every day | RECTAL | Status: AC | PRN

## 2013-06-19 MED ORDER — MYCOPHENOLATE MOFETIL 250 MG PO CAPS
1000.0000 mg | ORAL_CAPSULE | Freq: Two times a day (BID) | ORAL | Status: DC
  Administered 2013-06-19 – 2013-07-04 (×27): 1000 mg via ORAL
  Filled 2013-06-19 (×33): qty 4

## 2013-06-19 MED ORDER — POLYETHYLENE GLYCOL 3350 17 G PO PACK
17.0000 g | PACK | Freq: Every day | ORAL | Status: DC | PRN
  Filled 2013-06-19: qty 1

## 2013-06-19 MED ORDER — BUPROPION HCL ER (XL) 150 MG PO TB24
150.0000 mg | ORAL_TABLET | Freq: Every day | ORAL | Status: DC
  Administered 2013-06-20 – 2013-07-06 (×17): 150 mg via ORAL
  Filled 2013-06-19 (×18): qty 1

## 2013-06-19 MED ORDER — PROMETHAZINE HCL 25 MG/ML IJ SOLN
6.2500 mg | Freq: Once | INTRAMUSCULAR | Status: AC
  Administered 2013-06-19: 6.25 mg via INTRAVENOUS
  Filled 2013-06-19: qty 1

## 2013-06-19 MED ORDER — METOCLOPRAMIDE HCL 5 MG PO TABS
5.0000 mg | ORAL_TABLET | Freq: Three times a day (TID) | ORAL | Status: DC
  Administered 2013-06-19: 5 mg via ORAL
  Filled 2013-06-19 (×3): qty 1

## 2013-06-19 MED ORDER — POTASSIUM CHLORIDE ER 10 MEQ PO TBCR
20.0000 meq | EXTENDED_RELEASE_TABLET | Freq: Two times a day (BID) | ORAL | Status: DC
  Administered 2013-06-19 – 2013-06-23 (×8): 20 meq via ORAL
  Filled 2013-06-19 (×10): qty 2

## 2013-06-19 MED ORDER — ADULT MULTIVITAMIN W/MINERALS CH
1.0000 | ORAL_TABLET | Freq: Every day | ORAL | Status: DC
  Administered 2013-06-20 – 2013-06-23 (×3): 1 via ORAL
  Filled 2013-06-19 (×5): qty 1

## 2013-06-19 MED ORDER — ASPIRIN 81 MG PO CHEW
81.0000 mg | CHEWABLE_TABLET | Freq: Every day | ORAL | Status: DC
  Administered 2013-06-20 – 2013-07-06 (×17): 81 mg via ORAL
  Filled 2013-06-19 (×19): qty 1

## 2013-06-19 MED ORDER — BISACODYL 10 MG RE SUPP: 10.0000 mg | Freq: Every day | RECTAL | Status: DC | PRN

## 2013-06-19 MED ORDER — ACETAMINOPHEN 325 MG PO TABS
650.0000 mg | ORAL_TABLET | Freq: Four times a day (QID) | ORAL | Status: DC | PRN
  Administered 2013-07-01 – 2013-07-04 (×3): 650 mg via ORAL
  Filled 2013-06-19 (×3): qty 2

## 2013-06-19 MED ORDER — ATORVASTATIN CALCIUM 20 MG PO TABS
20.0000 mg | ORAL_TABLET | Freq: Every day | ORAL | Status: DC
  Administered 2013-06-19 – 2013-07-04 (×14): 20 mg via ORAL
  Filled 2013-06-19 (×16): qty 1

## 2013-06-19 MED ORDER — HYDROCODONE-ACETAMINOPHEN 5-325 MG PO TABS: 1.0000 | ORAL_TABLET | Freq: Four times a day (QID) | ORAL | Status: AC | PRN

## 2013-06-19 MED ORDER — ALPRAZOLAM 0.25 MG PO TABS
1.0000 mg | ORAL_TABLET | Freq: Every evening | ORAL | Status: DC | PRN
  Administered 2013-06-19 – 2013-06-21 (×3): 1 mg via ORAL
  Filled 2013-06-19 (×3): qty 4

## 2013-06-19 MED ORDER — BIOTENE DRY MOUTH MT LIQD
15.0000 mL | Freq: Two times a day (BID) | OROMUCOSAL | Status: DC
  Administered 2013-06-19 – 2013-07-06 (×30): 15 mL via OROMUCOSAL

## 2013-06-19 MED ORDER — ACETAMINOPHEN 650 MG RE SUPP: 650.0000 mg | Freq: Four times a day (QID) | RECTAL | Status: DC | PRN

## 2013-06-19 MED ORDER — HYDROCODONE-ACETAMINOPHEN 5-325 MG PO TABS
1.0000 | ORAL_TABLET | Freq: Four times a day (QID) | ORAL | Status: DC | PRN
  Administered 2013-06-22 – 2013-06-29 (×6): 1 via ORAL
  Filled 2013-06-19 (×6): qty 1

## 2013-06-19 MED ORDER — PROMETHAZINE HCL 12.5 MG PO TABS: 12.5000 mg | ORAL_TABLET | Freq: Four times a day (QID) | ORAL | Status: AC | PRN

## 2013-06-19 MED ORDER — ALPRAZOLAM 0.25 MG PO TABS: 0.2500 mg | ORAL_TABLET | Freq: Two times a day (BID) | ORAL | Status: DC | PRN

## 2013-06-19 MED ORDER — ONDANSETRON HCL 4 MG PO TABS: 4.0000 mg | ORAL_TABLET | Freq: Four times a day (QID) | ORAL | Status: DC | PRN

## 2013-06-19 MED ORDER — LIDOCAINE 5 % EX PTCH
1.0000 | MEDICATED_PATCH | CUTANEOUS | Status: DC
  Administered 2013-06-19 – 2013-07-05 (×17): 1 via TRANSDERMAL
  Filled 2013-06-19 (×18): qty 1

## 2013-06-19 MED ORDER — ONDANSETRON HCL 4 MG/2ML IJ SOLN: 4.0000 mg | Freq: Four times a day (QID) | INTRAMUSCULAR | Status: DC | PRN

## 2013-06-19 NOTE — Progress Notes (Signed)
Rehab admissions - We met with pt this am and conducted pt interview. Pt is agreeable to pursue inpatient rehab. Spoke with Dr. Eliseo Squires who stated that pt is medically ready for inpatient rehab. Bed is available today and will admit pt today to CIR. Case manager and social worker aware of plan. Informed Rn as well. Please call with any questions. Thanks,  Nanetta Batty, [place] [facility] [facility] Admissions Coordinator [phone]

## 2013-06-19 NOTE — PMR Pre-admission (Signed)
PMR Admission Coordinator Pre-Admission Assessment  Patient: [name] is an 80 y.o., female MRN: [id number] DOB: 13-Mar-1925 Height: 5\' 4"  (162.6 cm) Weight: 66.9 kg (147 lb 7.8 oz)              Insurance Information HMO: no   PPO:      PCP:     IPA:      80/20:      OTHER:  PRIMARY: Medicare Part A & B      Policy#: [id number]      Subscriber: self CM Name:       Phone#:      Fax#:  Pre-Cert#: verified in Visual merchandiser: retired Benefits:  Phone #:      Name:  Eff. Date: Part A: 05-24-91, Part B: 10-22-91     Deduct: $1260      Out of Pocket Max: none      Life Max: unlimited CIR: 100%      SNF: 100% days 1-20, 80% days 21-100 (100 day limit) Outpatient: 80%     Co-Pay: 20% Home Health: 100%       Co-Pay: none (no visit limits) DME: 80%      Co-Pay: 20% Providers: pt's preference  SECONDARY: A[employer]      Policy#: [id number]      Subscriber: self   CM Name:       Phone#:      Fax#:  Pre-Cert#:       Employer: retired Benefits:  Phone #: [phone]     Name:  Eff. Date:      Deduct:       Out of Pocket Max:       Life Max:  CIR:       SNF:  Outpatient:      Co-Pay:  Home Health:       Co-Pay:  DME:      Co-Pay:   Emergency Contact Information Contact Information   Name Relation Home Work [place] Daughter [phone]  [phone]   [facility] Daughter [phone]     Current Medical History  Patient Admitting Diagnosis: Deconditioning/? myasthenia gravis  History of Present Illness: [name] is a 80 y.o. right-handed female with history of myasthenia gravis on CellCept, uterine cancer, DVT with IVC filter, GI bleed. Patient well known to rehabilitation services from admission November 2014 for deconditioning related to incarcerated hernia. Patient has been living with her daughter and son-in-law completing her ADLs minimal assistance using a walker for ambulation. Admitted 06/11/2013 with progressive weakness over  the last 2 days involving the lower extremities with numbness. MRI of the brain negative for any acute changes. MRI cervical and thoracic spine is unremarkable except for some degenerative disc. MRI lumbar spine with no neural impingement. Neurology consulted possibilities included myasthenia gravis causing lower extremity fatigability versus possible GBS and placed on IVIG x5 days. Venous Doppler studies bilateral lower extremity showed DVT common femoral, profunda, femoral, popliteal, posterior tibial and perineal veins. Patient does have an IVC filter in place. Oncology services consulted for thrombocytopenia question HIT which was ruled out. Close monitoring of latest platelet count 80,000 as well as hemoglobin 8.2. Physical occupational therapy evaluations completed an ongoing with recommendations of physical medicine rehabilitation consult to consider inpatient rehabilitation services.  NIH Total: 3  Past Medical History  Past Medical History  Diagnosis Date  . Anemia   . Hyperlipemia   . Hypertension   . Thyroid disease  hypothyroidism  . Anxiety   . Vitamin B 12 deficiency   . TIA (transient ischemic attack)     4-09  . Microscopic hematuria     benign microscopic hematuria, worked up b Dr Katrine Coho  . Gall stone     porcelain gall bladder with single stone  per CT 02-2009 Dr Newton Pigg  . Osteoporosis     DEXA on 04-30-10  . Myasthenia gravis     sees Dr. Krista Blue   . Ulcerative colitis   . DVT of leg (deep venous thrombosis) 12/2012    left leg; coumadin d/c b/c of LGI bleed; s/p IVC filter  . CMV colitis   . Cancer     uterine/ovarian  . Chronic systolic CHF (congestive heart failure)   . Cardiomyopathy     a. in setting of incarcerated hernia, AF with RVR, ARF, NSTEMI => probable [place] CM given WM abnormality; Echocardiogram (03/23/13): Mod LVH, EF 30-35% with AK of the entire apical myocardium, AS/AL/Inf HK, Gr 1 DD, aortic sclerosis, MAC, mild MR, atrial septal  lipomatous hypertrophy, mod TR, PASP 37;   Echo (04/2013): EF 60-65% normal wall motion, Gr 1 DD, MAC, mild to mod LAE  . Paroxysmal atrial fibrillation     not on coumadin due to AF in setting of acute illness as well as prior GI bleed on coumadin for DVT  . NSTEMI (non-ST elevated myocardial infarction)     a. 03/2013 in setting of incarcerated ventral hernia, SBO, ARF => likely Type 2 NSTEMI    Family History  family history includes Colon cancer in her daughter; Heart disease in her daughter, father, sister, and son.  Prior Rehab/Hospitalizations: had previous inpatient rehab stay in December 2014. Was then followed up with home health RN/PT.     Current Medications  Current facility-administered medications:acetaminophen (TYLENOL) suppository 650 mg, 650 mg, Rectal, Q6H PRN, Janece Canterbury, MD;  acetaminophen (TYLENOL) tablet 650 mg, 650 mg, Oral, Q6H PRN, Janece Canterbury, MD, 650 mg at 06/14/13 1223;  ALPRAZolam Duanne Moron) tablet 0.25 mg, 0.25 mg, Oral, BID PRN, Janece Canterbury, MD;  ALPRAZolam Duanne Moron) tablet 0.25 mg, 0.25 mg, Oral, BID PRN, Janece Canterbury, MD, 0.25 mg at 06/15/13 1111 ALPRAZolam Duanne Moron) tablet 1 mg, 1 mg, Oral, QHS PRN, Janece Canterbury, MD, 1 mg at 06/14/13 2120;  aspirin chewable tablet 81 mg, 81 mg, Oral, Daily, Janece Canterbury, MD, 81 mg at 06/18/13 1018;  atorvastatin (LIPITOR) tablet 20 mg, 20 mg, Oral, Q supper, Janece Canterbury, MD, 20 mg at 06/18/13 1845;  bisacodyl (DULCOLAX) suppository 10 mg, 10 mg, Rectal, Daily PRN, Janece Canterbury, MD buPROPion (WELLBUTRIN XL) 24 hr tablet 150 mg, 150 mg, Oral, Q breakfast, Janece Canterbury, MD, 150 mg at 06/19/13 [id number];  feeding supplement (ENSURE COMPLETE) (ENSURE COMPLETE) liquid 237 mL, 237 mL, Oral, BID BM, Reanne J Barnett, RD, 237 mL at 06/18/13 1020;  feeding supplement (ENSURE) (ENSURE) pudding 1 Container, 1 Container, Oral, Q24H, Baird Lyons, RD, 1 Container at 06/18/13 1845 feeding supplement (RESOURCE BREEZE)  (RESOURCE BREEZE) liquid 1 Container, 1 Container, Oral, Q24H, Reanne J Barnett, RD;  ferrous sulfate tablet 325 mg, 325 mg, Oral, Q breakfast, Janece Canterbury, MD, 325 mg at 06/19/13 [id number];  HYDROcodone-acetaminophen (NORCO/VICODIN) 5-325 MG per tablet 1 tablet, 1 tablet, Oral, Q6H PRN, Geradine Girt, DO, 1 tablet at 06/18/13 2217 levothyroxine (SYNTHROID, LEVOTHROID) tablet 125 mcg, 125 mcg, Oral, QAC breakfast, Janece Canterbury, MD, 125 mcg at 06/19/13 0758;  loperamide (IMODIUM) capsule 2 mg, 2  mg, Oral, Daily, Janece Canterbury, MD, 2 mg at 06/18/13 1018;  loperamide (IMODIUM) capsule 4 mg, 4 mg, Oral, QHS, Janece Canterbury, MD, 4 mg at 06/18/13 2211;  multivitamin with minerals tablet 1 tablet, 1 tablet, Oral, Daily, Baird Lyons, RD, 1 tablet at 06/18/13 1019 mycophenolate (CELLCEPT) capsule 1,000 mg, 1,000 mg, Oral, BID, Janece Canterbury, MD, 1,000 mg at 06/18/13 2211;  pantoprazole (PROTONIX) EC tablet 40 mg, 40 mg, Oral, Daily, Janece Canterbury, MD, 40 mg at 06/18/13 1018;  polyethylene glycol (MIRALAX / GLYCOLAX) packet 17 g, 17 g, Oral, Daily PRN, Janece Canterbury, MD;  potassium chloride (K-DUR) CR tablet 20 mEq, 20 mEq, Oral, BID, Janece Canterbury, MD, 20 mEq at 06/18/13 2212 promethazine (PHENERGAN) tablet 12.5 mg, 12.5 mg, Oral, Q6H PRN, Janece Canterbury, MD, 12.5 mg at 06/18/13 0845;  sodium chloride 0.9 % injection 10-40 mL, 10-40 mL, Intracatheter, PRN, Geradine Girt, DO, 10 mL at 06/18/13 0523;  sodium chloride 0.9 % injection 3 mL, 3 mL, Intravenous, Q12H, Janece Canterbury, MD, 3 mL at 06/18/13 1020  Patients Current Diet: Dysphagia  Precautions / Restrictions Precautions Precautions: Fall Restrictions Weight Bearing Restrictions: No   Prior Activity Level Household:  (has been staying at home for awhile. "My daughter was afraid I would fall. I hadn't been out in awhile to get my hair done or have a pedicure."  [place] Devices/Equipment:  Dentures (specify type);Eyeglasses;Walker (specify type);Bedside commode/3-in-1;Wheelchair Home Equipment: Walker - 4 wheels;Shower seat - built in;Grab bars - toilet;Bedside commode  Prior Functional Level Prior Function Level of Independence: Independent with assistive device(s) Comments: ambulates with rollator   Current Functional Level Cognition  Overall Cognitive Status: Within Functional Limits for tasks assessed Orientation Level: Oriented X4    Extremity Assessment (includes Sensation/Coordination)          ADLs  Eating/Feeding: Set up Where Assessed - Eating/Feeding: Bed level Grooming: Set up;Supervision/safety Where Assessed - Grooming: Supported sitting Upper Body Bathing: Supervision/safety;Set up Where Assessed - Upper Body Bathing: Supported sitting Lower Body Bathing: Maximal assistance Where Assessed - Lower Body Bathing: Supported sit to stand Upper Body Dressing: Moderate assistance Where Assessed - Upper Body Dressing: Supported sitting Lower Body Dressing: +1 Total assistance Where Assessed - Lower Body Dressing: Supported sit to Lobbyist: Moderate assistance Toilet Transfer Method: Stand pivot Toilet Transfer Equipment:  (Bed to recliner going to pt's right) Toileting - Clothing Manipulation and Hygiene: +1 Total assistance Where Assessed - Toileting Clothing Manipulation and Hygiene: Standing Equipment Used: Gait belt;Rolling walker Transfers/Ambulation Related to ADLs: Mod A sit<stand and stand pivot    Mobility  Overal bed mobility: Needs Assistance Bed Mobility: Supine to Sit Supine to sit: Min assist;HOB elevated Sit to supine: Mod assist;+2 for physical assistance General bed mobility comments: assist for LEs out of bed    Transfers  Overall transfer level: Needs assistance Equipment used: None Transfers: Squat Pivot Transfers Sit to Stand: Mod assist;+2 physical assistance Stand pivot transfers: Mod assist;+2 physical  assistance Squat pivot transfers: Mod assist General transfer comment: Performed squat pivot transfers bed to [facility] and BSC to recliner with mod A, cues for hand placement, facilitation for wt shifting.  Pt requires only 1 person assist using this technique    Ambulation / Gait / Stairs / Wheelchair Mobility  Ambulation/Gait Ambulation Distance (Feet): 8 Feet General Gait Details: Verbal cues for safe use of RW and gait sequence.  Patient able to maneuver RW straight ahead.  Able to  ambulate 8' with encouragement.  Fatigued quickly.    Posture / Balance Dynamic Sitting Balance Sitting balance - Comments: pt able to sit on BSC x 10 minutes without assist    Special needs/care consideration BiPAP/CPAP no CPM no Continuous Drip IV  Dialysis no          Life Vest no  Oxygen - currently on 1L O2, Rn states they plan to wean pt to room air   Special Bed no  Trach Size no Wound Vac (area) no       Skin - pt states she has had a rash on her lower back for months ("it burns sometimes") and that home health RN was checking it weekly                              Bowel mgmt: - last BM documented 1-27. Pt states she had a BM today (1-28).  Bladder mgmt: - has been using the bedpan or ambulating to commode Vision issues - pt has double vision and wears glasses for activity Diabetic mgmt no   Previous Home Environment Living Arrangements: Children Available Help at Discharge: Family;Available 24 hours/day Type of Home: House Home Layout: Two level;Able to live on main level with bedroom/bathroom Home Access: Level entry Bathroom Shower/Tub: Walk-in shower;Door [employer] Toilet: Handicapped height [place]: Yes Type of Home Care Services: Home RN [place] (if known): [facility] Additional Comments: has a built in shower seat and tub shower  Discharge Living Setting Plans for Discharge Living Setting: Lives with (comment) (dtr and son in law) Type of Home at Discharge:  House Discharge Home Layout: Two level;Able to live on main level with bedroom/bathroom Discharge Home Access: Level entry Does the patient have any problems obtaining your medications?: No  Social/Family/Support Systems Contact Information: dtr Lucie Leather and son in law "Rin", Rin will be primary caregiver as dtr works Anticipated Caregiver: [place], son in Insurance account manager Information: see above (pt did not know Rin's number) Ability/Limitations of Caregiver: no limitations, son in law is retired Careers adviser: 24/7 Discharge Plan Discussed with Primary Caregiver: Yes (per pt., pt has been to [facility] before and family already familiar) Is Caregiver In Agreement with Plan?: Yes Does Caregiver/Family have Issues with Lodging/Transportation while Pt is in Rehab?: No  Goals/Additional Needs Patient/Family Goal for Rehab: Sup w/ PT, Sup to minA w/ OT, NA w/ SLP   Expected length of stay: 15-20 days Cultural Considerations: none Dietary Needs: Dys 2 Equipment Needs: to be determined Special Service Needs: none Pt/Family Agrees to Admission and willing to participate: Yes Program Orientation Provided & Reviewed with Pt/Caregiver Including Roles  & Responsibilities: Yes   Decrease burden of Care through IP rehab admission: NA   Possible need for SNF placement upon discharge: not anticipated  Patient Condition: This patient's medical and functional status has changed since the consult dated: 06/17/13 in which the Rehabilitation Physician determined and documented that the patient's condition is appropriate for intensive rehabilitative care in an inpatient rehabilitation facility. See "History of Present Illness" (above) for medical update. Functional changes are: moderate assistance for transfers with rest breaks. Patient's medical and functional status update has been discussed with the Rehabilitation physician and patient remains appropriate for inpatient  rehabilitation. Will admit to inpatient rehab today.  Preadmission Screen Completed By:  Nanetta Batty, PT 06/19/2013 10:09 AM ______________________________________________________________________   Discussed status with Dr. Naaman Plummer on 06/19/13  at 1054 and received telephone approval for admission today.  Admission Coordinator:  Nanetta Batty, PT time 1054/Date 06/19/13

## 2013-06-19 NOTE — Discharge Summary (Signed)
Physician Discharge Summary  [name] [id number] DOB: 02-28-1925 DOA: 06/11/2013  PCP: Lauren Morale, MD  Admit date: 06/11/2013 Discharge date: 06/19/2013  Time spent: 35 minutes  Recommendations for Outpatient Follow-up:  1. CBC- monitor plts and WBC count periodically 2. New med: reglan for nausea  Discharge Diagnoses:  Principal Problem:   Proximal muscle weakness Active Problems:   HYPOTHYROIDISM   ANXIETY   Anemia of chronic disease   Protein-calorie malnutrition, severe   Pedal edema   Cardiomyopathy   Weakness   Numbness of legs   Discharge Condition: improved  Diet recommendation: DYS 1 - per patient request (does not have dentures)  Filed Weights   06/11/13 1900 06/17/13 2017  Weight: 70.308 kg (155 lb) 66.9 kg (147 lb 7.8 oz)    History of present illness:  The patient is a 80 y.o. year-old female with history of myasthenia gravis on cellcept, uterine cancer, DVT s/p IVC filater because of GIB, hypothyroidism, NICM, PAF, anxiety. Last fall, [name] was hospitalized for incarcerated hernia s/p surgical repair with concomitant cholecystectomy, course complicated by type 2 NSTEMI with cardiomyopathy and SBO. She has also had CMV colitis and was taken off of her prednisone which had been used for her MG. She has been living at home with her daughter and son-in-law and completing her ADLs with minimal assistance, using a walker for ambulation. Two days ago, she was at her baseline.  Yesterday morning, she was getting around normally, however, over the day yesterday, she developed difficulty getting off the toilet, then later difficulty getting up from the bed and finally she was unable to sit up at all without assistance. She also complained of numbness which progressed to the level of the knee. She denies focal weakness, although she states her legs are weaker than her arms. Her numbness is bilateral. She denies confusion, slurred speech, facial droop,  seizures, syncope. Her MG has flared in the past, but usually her symptoms start with dysphagia and she has not had any dysphagia in the last day. She has not had fevers, chills, sore throat, cough. She has chronic nausea and anorexia and has lost about 10-lbs in the last month or so. She has NBNB emesis occasionally. She has chronic diarrhea about 2 times per day, applesauce--like or slightly thicker consistency, which has remained unchanged. Denies dysuria. She has had some muscle cramps recently. Her family called EMS to transport her to the ER because they could not lift her out of bed.  In the ER, WBC 5.3, hgb 9.9, at baseline, platelets 95, lower than previous (baseline 150-200). BMP with suggestion of mild dehydration with mildly elevated BUN from baseline 21. UA neg. CXR neg. VSS. CK wnl. Troponin < 0.3, ECG stable from prior.   Hospital Course:  Progressive weakness and lower extremity numbness- presumed GB  - TSH ok and cortisol level ok  - Continue to monitor for symptoms of underlying infection  - q4h neuro checks  - Neurology consultation - MRI done- ordered for lumbar spine as well- LP 1/23- suspect GB as patient has improved with IVIG  - Air mattress  - IVIG- day 5/5- presumed GB   Nausea  -improved with addition of reglan   DVT-  -appreciate oncology  -poor candidate for anticoagulation  Has filter- placed in 2014   Leukopenia/thrombocytopenia  -WBC improved/plts improved  -from cellcept- monitor periodically  Myasthenia Gravis with progressive weakness  - Continue cellcept For now - Monitor for evidence of respiratory distress  -incentive  spirometry   PAF, not on A/C secondary to GIB, currently NSR   HTN/HLD, BP stable, hold statin in acute setting and continue BB   ICM, weight stable leg swelling and weight loss. CXR without interstitial edema. Hold lasix   Lower extremity edema, stable and likely due to low albumin, although she also has hx of DVT (has IVC  filter)  - TED hose, elevate when able   CKD stage II/III, stable. Minimize nephrotoxins and renally dose medications   Hypothyroidism, continue synthroid   Anxiety, stable. 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 Defer to PCP    Severe protein-calorie malnutrition with hypoalbuminemia and weight loss due to anorexia with nausea  - Nutrition consultation  - Dietary supplements  - Liberalized diet   Sacral decubitus ulcer-stage 2  - Wound care consult  - Air mattress   Hx of uterine CA in remission      Procedures:  duplex  Consultations:  Oncology  neurology  Discharge Exam: Filed Vitals:   06/19/13 1046  BP: 140/66  Pulse: 64  Temp: 97.8 F (36.6 C)  Resp: 20    General: A+Ox3, NAD Cardiovascular: rrr Respiratory: clear anterior  Discharge Instructions      Discharge Orders   Future Appointments Provider Department Dept Phone   10/16/2013 1:30 PM Dennie Bible, NP [facility] [phone]   Future Orders Complete By Expires   Discharge instructions  As directed    Comments:     DYS 1       Medication List    STOP taking these medications       carvedilol 6.25 MG tablet  Commonly known as:  COREG     cyanocobalamin 1000 MCG/ML injection  Commonly known as:  (VITAMIN B-12)     furosemide 20 MG tablet  Commonly known as:  LASIX     PROBIOTIC DAILY PO      TAKE these medications       ALPRAZolam 0.25 MG tablet  Commonly known as:  XANAX  Take 0.25 mg by mouth 2 (two) times daily as needed for anxiety.     ALPRAZolam 1 MG tablet  Commonly known as:  XANAX  Take 1 mg by mouth at bedtime as needed for anxiety or sleep.     aspirin 81 MG chewable tablet  Chew 1 tablet (81 mg total) by mouth daily.     atorvastatin 20 MG tablet  Commonly known as:  LIPITOR  Take 1 tablet (20 mg total) by mouth daily with  supper.     bisacodyl 10 MG suppository  Commonly known as:  DULCOLAX  Place 1 suppository (10 mg total) rectally daily as needed for moderate constipation.     buPROPion 150 MG 24 hr tablet  Commonly known as:  WELLBUTRIN XL  Take 1 tablet (150 mg total) by mouth every morning.     feeding supplement (ENSURE COMPLETE) Liqd  Take 237 mLs by mouth daily.     ferrous sulfate 325 (65 FE) MG tablet  Take 325 mg by mouth daily with breakfast.     GLUCOSAMINE PO  Take 1 tablet by mouth daily.     HYDROcodone-acetaminophen 5-325 MG per tablet  Commonly known as:  NORCO/VICODIN  Take 1 tablet by mouth every 6 (six) hours as needed for moderate pain.     levothyroxine 125 MCG tablet  Commonly known as:  SYNTHROID, LEVOTHROID  Take 1 tablet (125 mcg total) by mouth daily.     loperamide 2 MG capsule  Commonly known as:  IMODIUM  Take 2-4 mg by mouth 2 (two) times daily. 2mg  in the morning and 4mg  at night     metoCLOPramide 5 MG tablet  Commonly known as:  REGLAN  Take 1 tablet (5 mg total) by mouth 3 (three) times daily before meals.     mycophenolate 500 MG tablet  Commonly known as:  CELLCEPT  Take 2 tablets (1,000 mg total) by mouth 2 (two) times daily.     NEXIUM PO  Take 1 capsule by mouth daily.     potassium chloride 10 MEQ tablet  Commonly known as:  K-DUR  Take 20 mEq by mouth 2 (two) times daily.     promethazine 12.5 MG tablet  Commonly known as:  PHENERGAN  Take 1 tablet (12.5 mg total) by mouth every 6 (six) hours as needed for nausea.       Allergies  Allergen Reactions  . Zofran [Ondansetron Hcl]     hypotension  . Ivp Dye [Iodinated Diagnostic Agents] Rash  . Sulfamethoxazole Rash    REACTION: unspecified      The results of significant diagnostics from this hospitalization (including imaging, microbiology, ancillary and laboratory) are listed below for reference.    Significant Diagnostic Studies: Dg Chest 2 View  06/11/2013   CLINICAL DATA:   Hypertension.  History of uterine cancer.  EXAM: CHEST  2 VIEW  COMPARISON:  Single view of the chest 03/25/2013.  FINDINGS: Port-A-Cath remains in place. There is cardiomegaly without edema. Lungs are clear. No pneumothorax or pleural effusion. No focal bony abnormality.  IMPRESSION: Cardiomegaly without acute disease.   Electronically Signed   By: Inge Rise M.D.   On: 06/11/2013 13:29   Mr Brain Wo Contrast  06/13/2013   CLINICAL DATA:  Progressive lower extremity weakness.  EXAM: MRI HEAD WITHOUT CONTRAST  MRI CERVICAL SPINE WITHOUT AND WITH CONTRAST  TECHNIQUE: Multiplanar, multiecho pulse sequences of the brain and surrounding structures, cervical spine, and thoracic spine to include the craniocervical junction and cervicothoracic junction, were obtained without and with intravenous contrast.  CONTRAST:  29mL MULTIHANCE GADOBENATE DIMEGLUMINE 529 MG/ML IV SOLN  COMPARISON:  Prior CT from 10/25/2007  FINDINGS: MRI HEAD FINDINGS  Susceptibility artifact is present at the right parietal scalp.  Diffuse prominence of the CSF containing spaces is compatible with advanced age related cerebral atrophy. Mild scattered and confluent T2/FLAIR hyperintensity seen within the periventricular and deep white matter and deep white matter, most compatible with chronic microvascular ischemic changes.  No mass lesion or midline shift. Ventricles are normal in size without evidence of hydrocephalus. No extra-axial fluid collection.  No diffusion-weighted signal abnormality is identified to suggest acute intracranial infarct. No intracranial hemorrhage identified. Normal flow voids are seen within the intracranial vasculature.  Craniocervical junction is normal. Pituitary gland within normal limits. Globes and optic nerves are within normal limits.  T1 hyperintense subcentimeter lesion noted within the left parietal calvarium, which may represent focal fatty marrow (series 11, image 200). This finding is of doubtful  clinical significance. Otherwise, the Calvarium demonstrates a normal appearance with normal signal intensity. Degenerative changes noted within the upper spine.  MRI CERVICAL SPINE FINDINGS  The craniocervical junction is widely patent.  There is slight reversal of the normal cervical lordosis with apex at C5-6. Trace anterolisthesis of C3 on C4 is present.  Signal intensity within the  vertebral body bone marrow is somewhat patchy and heterogeneous in appearance, which may be related to underlying osteoporosis. T1/T2 hyperintense lesion within the T1 vertebral body most likely reflects a benign hemangioma. Signal intensity within the cervical spinal cord is normal. No abnormal signal intensity seen within the dorsal columns to suggest subacute combined degeneration. No abnormal enhancement seen on post-contrast sequences.  At C2-3, there is severe right with moderate left facet arthrosis and broad-based diffuse degenerative disc bulge. No significant canal or neural foraminal stenosis.  At C3-4, there is severe right with mild left facet arthrosis with mild broad-based degenerative disc osteophyte. No significant canal or neural foraminal stenosis.  At C4-5, there is broad-based degenerative disc osteophyte with right greater than left uncovertebral osteophytosis and facet arthrosis. Left paracentral disc osteophyte flattens the left ventral thecal sac and abuts the ventral spinal cord. No significant canal or neural foraminal stenosis identified.  At C5-6, there is severe degenerative intervertebral disc space narrowing with broad-based degenerative disc osteophyte and bilateral facet arthrosis. Posterior disc osteophyte flattens and largely effaces the ventral thecal sac, abutting the cervical spinal cord. No significant canal stenosis or foraminal narrowing identified.  At C6-7, there is broad-based degenerative disc osteophyte with prominent bilateral facet arthrosis and uncovertebral osteophytosis. No  significant canal stenosis identified. Neural foramina are widely patent.  At C7-T1, there is mild broad-based degenerative disc osteophyte with bilateral facet arthrosis. Resultant mild bilateral neural foraminal narrowing is present without central canal stenosis.  Visualized soft tissues of the neck are within normal limits. Normal flow voids are seen within the vertebral arteries.  MRI THORACIC SPINE FINDINGS:  The vertebral bodies are normally aligned with preservation of the normal thoracic kyphosis. Vertebral body heights are preserved. No acute fracture or or listhesis.  Signal intensity within the vertebral body bone marrow is somewhat patchy and heterogeneous in appearance, which may be related to osteoporosis. No discrete osseous lesions.  Signal intensity within the thoracic spinal cord is within normal limits. Dorsal columns demonstrate a normal appearance with normal signal intensity. No abnormal enhancement seen on post-contrast sequences. The nerve roots of the cauda equina are within normal limits. The conus terminates at the L1 level.  Multilevel degenerative disc bulging is seen within the lower thoracic spine, most prevalent at T10-11, T11-12 and T12-L1. Mild to moderate bilateral facet hypertrophy is present at these levels as well. No definite focal disc protrusion identified within the thoracic spine. Degenerative disc bulging is also noted distally at the L1-2 and L2-3 levels on sagittal sequences.  Small bilateral pleural effusions with associated bibasilar atelectasis is present. Visualized paraspinous soft tissues are within normal limits.  IMPRESSION: MRI HEAD:  1. No acute intracranial infarct or other abnormality identified. 2. Advanced age-related atrophy with mild chronic microvascular ischemic changes.  MRI CERVICAL SPINE:  1. Normal appearance of the cervical spinal cord without signal abnormality to suggest subacute combined degeneration or other abnormality. No abnormal  enhancement. 2. Extensive multilevel degenerative disc disease throughout the cervical spine as detailed above without significant canal or neural foraminal stenosis  MRI THORACIC SPINE:  1. Normal appearance of the thoracic spinal cord without signal abnormality to suggest subacute combined degeneration or other abnormality. No abnormal enhancement. 2. Degenerative disc bulging and facet arthropathy at the T10 through L1 levels without significant canal or neural foraminal stenosis. 3. Small to moderate bilateral pleural effusions with associated bibasilar atelectasis.   Electronically Signed   By: Jeannine Boga M.D.   On: 06/13/2013 04:16  Mr Lumbar Spine Wo Contrast  06/15/2013   CLINICAL DATA:  Worsening gait difficulty. Lower extremity numbness.  EXAM: MRI LUMBAR SPINE WITHOUT CONTRAST  TECHNIQUE: Multiplanar, multisequence MR imaging was performed. No intravenous contrast was administered.  COMPARISON:  CT scan of the abdomen and pelvis dated 03/22/2013  FINDINGS: Tip of the conus is at L1 appears normal. The distal thoracic spinal cord appears normal. There appears to be a small amount of ascites in the abdomen with small amount of fluid around the right kidney, nonspecific. Multiple bilateral renal cysts.  Slight lumbar scoliosis with convexity to the left centered at L3.  T10-11 through L1-2: Slight disc space narrowing with tiny broad-based disc bulge is at each level with no neural impingement.  L2-3: Marked disc space narrowing. Small broad-based disc bulge with accompanying osteophytes with minimal narrowing of the spinal canal. No neural impingement. Slight hypertrophy of the right ligamentum flavum.  L3-4: Right anterior lateral osteophytes fuse the L3-4 level. Disc spaces markedly narrowed. Small broad-based osteophytes minimally narrow the spinal canal. Slight hypertrophy of the right ligamentum flavum creates slight narrowing of the right lateral recess without neural impingement.  L4-5:   The vertebral bodies are fused.  No neural impingement.  L5-S1: Slight disc space narrowing with a tiny broad-based disc bulge with accompanying osteophytes. Slight degenerative changes of the facet joints with narrowing of the left lateral recess without impingement upon the exiting left S1 nerve.  IMPRESSION: 1. No neural impingement in the lumbar spine or lower thoracic spine. 2. Distal thoracic spinal cord appears normal. 3. Multilevel degenerative disc disease. 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   Electronically Signed   By: Rozetta Nunnery M.D.   On: 06/15/2013 10:40   Mr Cervical Spine W Wo Contrast  06/13/2013   CLINICAL DATA:  Progressive lower extremity weakness.  EXAM: MRI HEAD WITHOUT CONTRAST  MRI CERVICAL SPINE WITHOUT AND WITH CONTRAST  TECHNIQUE: Multiplanar, multiecho pulse sequences of the brain and surrounding structures, cervical spine, and thoracic spine to include the craniocervical junction and cervicothoracic junction, were obtained without and with intravenous contrast.  CONTRAST:  48mL MULTIHANCE GADOBENATE DIMEGLUMINE 529 MG/ML IV SOLN  COMPARISON:  Prior CT from 10/25/2007  FINDINGS: MRI HEAD FINDINGS  Susceptibility artifact is present at the right parietal scalp.  Diffuse prominence of the CSF containing spaces is compatible with advanced age related cerebral atrophy. Mild scattered and confluent T2/FLAIR hyperintensity seen within the periventricular and deep white matter and deep white matter, most compatible with chronic microvascular ischemic changes.  No mass lesion or midline shift. Ventricles are normal in size without evidence of hydrocephalus. No extra-axial fluid collection.  No diffusion-weighted signal abnormality is identified to suggest acute intracranial infarct. No intracranial hemorrhage identified. Normal flow voids are seen within the intracranial vasculature.  Craniocervical junction is normal. Pituitary gland within normal limits. Globes and optic nerves are within  normal limits.  T1 hyperintense subcentimeter lesion noted within the left parietal calvarium, which may represent focal fatty marrow (series 11, image 200). This finding is of doubtful clinical significance. Otherwise, the Calvarium demonstrates a normal appearance with normal signal intensity. Degenerative changes noted within the upper spine.  MRI CERVICAL SPINE FINDINGS  The craniocervical junction is widely patent.  There is slight reversal of the normal cervical lordosis with apex at C5-6. Trace anterolisthesis of C3 on C4 is present.  Signal intensity within the vertebral body bone marrow is somewhat patchy and heterogeneous in appearance, which may be related to underlying  osteoporosis. T1/T2 hyperintense lesion within the T1 vertebral body most likely reflects a benign hemangioma. Signal intensity within the cervical spinal cord is normal. No abnormal signal intensity seen within the dorsal columns to suggest subacute combined degeneration. No abnormal enhancement seen on post-contrast sequences.  At C2-3, there is severe right with moderate left facet arthrosis and broad-based diffuse degenerative disc bulge. No significant canal or neural foraminal stenosis.  At C3-4, there is severe right with mild left facet arthrosis with mild broad-based degenerative disc osteophyte. No significant canal or neural foraminal stenosis.  At C4-5, there is broad-based degenerative disc osteophyte with right greater than left uncovertebral osteophytosis and facet arthrosis. Left paracentral disc osteophyte flattens the left ventral thecal sac and abuts the ventral spinal cord. No significant canal or neural foraminal stenosis identified.  At C5-6, there is severe degenerative intervertebral disc space narrowing with broad-based degenerative disc osteophyte and bilateral facet arthrosis. Posterior disc osteophyte flattens and largely effaces the ventral thecal sac, abutting the cervical spinal cord. No significant canal  stenosis or foraminal narrowing identified.  At C6-7, there is broad-based degenerative disc osteophyte with prominent bilateral facet arthrosis and uncovertebral osteophytosis. No significant canal stenosis identified. Neural foramina are widely patent.  At C7-T1, there is mild broad-based degenerative disc osteophyte with bilateral facet arthrosis. Resultant mild bilateral neural foraminal narrowing is present without central canal stenosis.  Visualized soft tissues of the neck are within normal limits. Normal flow voids are seen within the vertebral arteries.  MRI THORACIC SPINE FINDINGS:  The vertebral bodies are normally aligned with preservation of the normal thoracic kyphosis. Vertebral body heights are preserved. No acute fracture or or listhesis.  Signal intensity within the vertebral body bone marrow is somewhat patchy and heterogeneous in appearance, which may be related to osteoporosis. No discrete osseous lesions.  Signal intensity within the thoracic spinal cord is within normal limits. Dorsal columns demonstrate a normal appearance with normal signal intensity. No abnormal enhancement seen on post-contrast sequences. The nerve roots of the cauda equina are within normal limits. The conus terminates at the L1 level.  Multilevel degenerative disc bulging is seen within the lower thoracic spine, most prevalent at T10-11, T11-12 and T12-L1. Mild to moderate bilateral facet hypertrophy is present at these levels as well. No definite focal disc protrusion identified within the thoracic spine. Degenerative disc bulging is also noted distally at the L1-2 and L2-3 levels on sagittal sequences.  Small bilateral pleural effusions with associated bibasilar atelectasis is present. Visualized paraspinous soft tissues are within normal limits.  IMPRESSION: MRI HEAD:  1. No acute intracranial infarct or other abnormality identified. 2. Advanced age-related atrophy with mild chronic microvascular ischemic changes.   MRI CERVICAL SPINE:  1. Normal appearance of the cervical spinal cord without signal abnormality to suggest subacute combined degeneration or other abnormality. No abnormal enhancement. 2. Extensive multilevel degenerative disc disease throughout the cervical spine as detailed above without significant canal or neural foraminal stenosis  MRI THORACIC SPINE:  1. Normal appearance of the thoracic spinal cord without signal abnormality to suggest subacute combined degeneration or other abnormality. No abnormal enhancement. 2. Degenerative disc bulging and facet arthropathy at the T10 through L1 levels without significant canal or neural foraminal stenosis. 3. Small to moderate bilateral pleural effusions with associated bibasilar atelectasis.   Electronically Signed   By: Jeannine Boga M.D.   On: 06/13/2013 04:16   Mr Thoracic Spine W Wo Contrast  06/13/2013   CLINICAL DATA:  Progressive lower extremity  weakness.  EXAM: MRI HEAD WITHOUT CONTRAST  MRI CERVICAL SPINE WITHOUT AND WITH CONTRAST  TECHNIQUE: Multiplanar, multiecho pulse sequences of the brain and surrounding structures, cervical spine, and thoracic spine to include the craniocervical junction and cervicothoracic junction, were obtained without and with intravenous contrast.  CONTRAST:  64mL MULTIHANCE GADOBENATE DIMEGLUMINE 529 MG/ML IV SOLN  COMPARISON:  Prior CT from 10/25/2007  FINDINGS: MRI HEAD FINDINGS  Susceptibility artifact is present at the right parietal scalp.  Diffuse prominence of the CSF containing spaces is compatible with advanced age related cerebral atrophy. Mild scattered and confluent T2/FLAIR hyperintensity seen within the periventricular and deep white matter and deep white matter, most compatible with chronic microvascular ischemic changes.  No mass lesion or midline shift. Ventricles are normal in size without evidence of hydrocephalus. No extra-axial fluid collection.  No diffusion-weighted signal abnormality is identified  to suggest acute intracranial infarct. No intracranial hemorrhage identified. Normal flow voids are seen within the intracranial vasculature.  Craniocervical junction is normal. Pituitary gland within normal limits. Globes and optic nerves are within normal limits.  T1 hyperintense subcentimeter lesion noted within the left parietal calvarium, which may represent focal fatty marrow (series 11, image 200). This finding is of doubtful clinical significance. Otherwise, the Calvarium demonstrates a normal appearance with normal signal intensity. Degenerative changes noted within the upper spine.  MRI CERVICAL SPINE FINDINGS  The craniocervical junction is widely patent.  There is slight reversal of the normal cervical lordosis with apex at C5-6. Trace anterolisthesis of C3 on C4 is present.  Signal intensity within the vertebral body bone marrow is somewhat patchy and heterogeneous in appearance, which may be related to underlying osteoporosis. T1/T2 hyperintense lesion within the T1 vertebral body most likely reflects a benign hemangioma. Signal intensity within the cervical spinal cord is normal. No abnormal signal intensity seen within the dorsal columns to suggest subacute combined degeneration. No abnormal enhancement seen on post-contrast sequences.  At C2-3, there is severe right with moderate left facet arthrosis and broad-based diffuse degenerative disc bulge. No significant canal or neural foraminal stenosis.  At C3-4, there is severe right with mild left facet arthrosis with mild broad-based degenerative disc osteophyte. No significant canal or neural foraminal stenosis.  At C4-5, there is broad-based degenerative disc osteophyte with right greater than left uncovertebral osteophytosis and facet arthrosis. Left paracentral disc osteophyte flattens the left ventral thecal sac and abuts the ventral spinal cord. No significant canal or neural foraminal stenosis identified.  At C5-6, there is severe degenerative  intervertebral disc space narrowing with broad-based degenerative disc osteophyte and bilateral facet arthrosis. Posterior disc osteophyte flattens and largely effaces the ventral thecal sac, abutting the cervical spinal cord. No significant canal stenosis or foraminal narrowing identified.  At C6-7, there is broad-based degenerative disc osteophyte with prominent bilateral facet arthrosis and uncovertebral osteophytosis. No significant canal stenosis identified. Neural foramina are widely patent.  At C7-T1, there is mild broad-based degenerative disc osteophyte with bilateral facet arthrosis. Resultant mild bilateral neural foraminal narrowing is present without central canal stenosis.  Visualized soft tissues of the neck are within normal limits. Normal flow voids are seen within the vertebral arteries.  MRI THORACIC SPINE FINDINGS:  The vertebral bodies are normally aligned with preservation of the normal thoracic kyphosis. Vertebral body heights are preserved. No acute fracture or or listhesis.  Signal intensity within the vertebral body bone marrow is somewhat patchy and heterogeneous in appearance, which may be related to osteoporosis. No discrete osseous lesions.  Signal intensity within the thoracic spinal cord is within normal limits. Dorsal columns demonstrate a normal appearance with normal signal intensity. No abnormal enhancement seen on post-contrast sequences. The nerve roots of the cauda equina are within normal limits. The conus terminates at the L1 level.  Multilevel degenerative disc bulging is seen within the lower thoracic spine, most prevalent at T10-11, T11-12 and T12-L1. Mild to moderate bilateral facet hypertrophy is present at these levels as well. No definite focal disc protrusion identified within the thoracic spine. Degenerative disc bulging is also noted distally at the L1-2 and L2-3 levels on sagittal sequences.  Small bilateral pleural effusions with associated bibasilar atelectasis  is present. Visualized paraspinous soft tissues are within normal limits.  IMPRESSION: MRI HEAD:  1. No acute intracranial infarct or other abnormality identified. 2. Advanced age-related atrophy with mild chronic microvascular ischemic changes.  MRI CERVICAL SPINE:  1. Normal appearance of the cervical spinal cord without signal abnormality to suggest subacute combined degeneration or other abnormality. No abnormal enhancement. 2. Extensive multilevel degenerative disc disease throughout the cervical spine as detailed above without significant canal or neural foraminal stenosis  MRI THORACIC SPINE:  1. Normal appearance of the thoracic spinal cord without signal abnormality to suggest subacute combined degeneration or other abnormality. No abnormal enhancement. 2. Degenerative disc bulging and facet arthropathy at the T10 through L1 levels without significant canal or neural foraminal stenosis. 3. Small to moderate bilateral pleural effusions with associated bibasilar atelectasis.   Electronically Signed   By: Jeannine Boga M.D.   On: 06/13/2013 04:16   Dg Fluoro Guide Lumbar Puncture  06/14/2013   CLINICAL DATA:  Bilateral lower extremity weakness.  EXAM: DIAGNOSTIC LUMBAR PUNCTURE UNDER FLUOROSCOPIC GUIDANCE  FLUOROSCOPY TIME:  40 seconds  PROCEDURE: Informed consent was obtained from the patient prior to the procedure, including potential complications of headache, allergy, and pain. With the patient prone, the lower back was prepped with Betadine. 1% Lidocaine was used for local anesthesia. Lumbar puncture was performed at the L3-4 level using a 20 gauge needle with return of clear CSF with an opening pressure of 10 cm water. 2ml of CSF were obtained for laboratory studies. The patient tolerated the procedure well and there were no apparent complications.  IMPRESSION: Lumbar puncture without complication.   Electronically Signed   By: Inge Rise M.D.   On: 06/14/2013 12:15     Microbiology: Recent Results (from the past 240 hour(s))  URINE CULTURE     Status: None   Collection Time    06/11/13 12:23 PM      Result Value Range Status   Specimen Description URINE, CLEAN CATCH   Final   Special Requests NONE   Final   Culture  Setup Time     Final   Value: 06/11/2013 16:38     Performed at [facility]     Final   Value: 30,000 COLONIES/ML     Performed at [facility]   Culture     Final   Value: Multiple bacterial morphotypes present, none predominant. Suggest appropriate recollection if clinically indicated.     Performed at [facility]   Report Status 06/12/2013 FINAL   Final     Labs: Basic Metabolic Panel:  Recent Labs Lab 06/15/13 0500 06/16/13 0500 06/17/13 0445 06/18/13 0500 06/19/13 0507  NA 137 134* 135* 132* 134*  K 4.6 4.5 4.6 4.7 4.4  CL 110 107 106 105 107  CO2  21 20 21 20 20   GLUCOSE 105* 104* 83 97 119*  BUN 29* 24* 23 21 21   CREATININE 0.93 0.82 0.82 0.85 0.83  CALCIUM 7.1* 7.0* 6.9* 7.0* 6.9*   Liver Function Tests: No results found for this basename: AST, ALT, ALKPHOS, BILITOT, PROT, ALBUMIN,  in the last 168 hours No results found for this basename: LIPASE, AMYLASE,  in the last 168 hours No results found for this basename: AMMONIA,  in the last 168 hours CBC:  Recent Labs Lab 06/15/13 0500 06/16/13 0500 06/17/13 0445 06/18/13 0500 06/19/13 0507  WBC 1.9* 2.7* 2.5* 2.4* 2.3*  NEUTROABS  --  2.1  --   --   --   HGB 8.5* 8.8* 8.2* 9.1* 8.6*  HCT 25.9* 26.4* 25.1* 27.4* 25.9*  MCV 91.8 91.3 91.6 91.3 91.2  PLT 69* 79* 80* 95* 84*   Cardiac Enzymes: No results found for this basename: CKTOTAL, CKMB, CKMBINDEX, TROPONINI,  in the last 168 hours BNP: BNP (last 3 results)  Recent Labs  12/08/12 0026 03/27/13 0120  PROBNP 465.5* 5592.0*   CBG: No results found for this basename: GLUCAP,  in the last 168 hours     Signed:  Eliseo Squires, Maykel Reitter  [facility] 06/19/2013, 11:17 AM

## 2013-06-19 NOTE — H&P (Signed)
Physical Medicine and Rehabilitation Admission H&P  Chief Complaint   Patient presents with   .  Fatigue   .  Weakness   :  Chief complaint: Weakness  HPI: [name] is a 80 y.o. right-handed female with history of myasthenia gravis on CellCept, uterine cancer, DVT with IVC filter, GI bleed. Patient well known to rehabilitation services from admission November 2014 for deconditioning related to incarcerated hernia. Patient has been living with her daughter and son-in-law completing her ADLs minimal assistance using a walker for ambulation. Admitted 06/11/2013 with progressive weakness over the last 2 days involving the lower extremities with numbness. MRI of the brain negative for any acute changes. MRI cervical and thoracic spine is unremarkable except for some degenerative disc. MRI lumbar spine with no neural impingement. Neurology consulted possibilities included myasthenia gravis causing lower extremity fatigability versus possible GBS and placed on IVIG x5 days completed 06/18/2013. Venous Doppler studies bilateral lower extremity showed DVT common femoral, profunda, femoral, popliteal, posterior tibial and perineal veins. Patient does have an IVC filter in place. Oncology services consulted for thrombocytopenia question HIT which was ruled out and platelets continue to trend upward and monitored. Patient's latest platelet count 84,000 hemoglobin 8.6. Patient with sacral wound and air mattress overlay in place with wound care of allevyn to promote healing change every 3 days as needed. Physical and occupational therapy evaluations completed an ongoing with recommendations of physical medicine rehabilitation consult to consider inpatient rehabilitation services. Patient was admitted for comprehensive rehabilitation program    ROS Review of Systems  Musculoskeletal: Positive for back pain, joint pain and myalgias.  Neurological: Positive for tingling and weakness.  Psychiatric/Behavioral:   Anxiety . Depression  All other systems reviewed and are negative  Past Medical History   Diagnosis  Date   .  Anemia    .  Hyperlipemia    .  Hypertension    .  Thyroid disease      hypothyroidism   .  Anxiety    .  Vitamin B 12 deficiency    .  TIA (transient ischemic attack)      4-09   .  Microscopic hematuria      benign microscopic hematuria, worked up b Dr Katrine Coho   .  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  Osteoporosis      DEXA on 04-30-10   .  Myasthenia gravis      sees Dr. Krista Blue   .  Ulcerative colitis    .  DVT of leg (deep venous thrombosis)  12/2012     left leg; coumadin d/c b/c of LGI bleed; s/p IVC filter   .  CMV colitis    .  Cancer      uterine/ovarian   .  Chronic systolic CHF (congestive heart failure)    .  Cardiomyopathy      a. in setting of incarcerated hernia, AF with RVR, ARF, NSTEMI => probable [place] CM given WM abnormality; Echocardiogram (03/23/13): Mod LVH, EF 30-35% with AK of the entire apical myocardium, AS/AL/Inf HK, Gr 1 DD, aortic sclerosis, MAC, mild MR, atrial septal lipomatous hypertrophy, mod TR, PASP 37; Echo (04/2013): EF 60-65% normal wall motion, Gr 1 DD, MAC, mild to mod LAE   .  Paroxysmal atrial fibrillation      not on coumadin due to AF in setting of acute illness as well as prior  GI bleed on coumadin for DVT   .  NSTEMI (non-ST elevated myocardial infarction)      a. 03/2013 in setting of incarcerated ventral hernia, SBO, ARF => likely Type 2 NSTEMI    Past Surgical History   Procedure  Laterality  Date   .  Cardiac catheterization       04/1999   .  Abdominal hysterectomy       with oophorectomy   .  Colonoscopy   1999     normal   .  Colonoscopy  N/A  12/10/2012     Procedure: COLONOSCOPY; Surgeon: Gatha Mayer, MD; Location: [facility]; Service: Endoscopy; Laterality: N/A;   .  Flexible sigmoidoscopy  N/A  01/26/2013     Procedure: FLEXIBLE  SIGMOIDOSCOPY; Surgeon: Gatha Mayer, MD; Location: [facility]; Service: Endoscopy; Laterality: N/A;   .  Ventral hernia repair  N/A  03/25/2013     Procedure: LAPAROSCOPIC VENTRAL HERNIA REPAIR ; Surgeon: Adin Hector, MD; Location: [facility]; Service: General; Laterality: N/A;   .  Cholecystectomy  N/A  03/25/2013     Procedure: LAPAROSCOPIC CHOLECYSTECTOMY WITH INTRAOPERATIVE CHOLANGIOGRAM; Surgeon: Adin Hector, MD; Location: [facility]; Service: General; Laterality: N/A;   .  Insertion of mesh  N/A  03/25/2013     Procedure: INSERTION OF MESH; Surgeon: Adin Hector, MD; Location: [facility]; Service: General; Laterality: N/A;   .  Laparoscopic lysis of adhesions  N/A  03/25/2013     Procedure: LAPAROSCOPIC LYSIS OF ADHESIONS; Surgeon: Adin Hector, MD; Location: [facility]; Service: General; Laterality: N/A;   .  Omentectomy  N/A  03/25/2013     Procedure: PARTIAL OMENTECTOMY; Surgeon: Adin Hector, MD; Location: [facility]; Service: General; Laterality: N/A;    Family History   Problem  Relation  Age of Onset   .  Heart disease  Father    .  Heart disease  Sister    .  Colon cancer  Daughter    .  Heart disease  Son    .  Heart disease  Daughter     Social History: reports that she has never smoked. She has never used smokeless tobacco. She reports that she does not drink alcohol or use illicit drugs.  Allergies:  Allergies   Allergen  Reactions   .  Zofran [Ondansetron Hcl]      hypotension   .  Ivp Dye [Iodinated Diagnostic Agents]  Rash   .  Sulfamethoxazole  Rash     REACTION: unspecified    Medications Prior to Admission   Medication  Sig  Dispense  Refill   .  ALPRAZolam (XANAX) 0.25 MG tablet  Take 0.25 mg by mouth 2 (two) times daily as needed for anxiety.     .  ALPRAZolam (XANAX) 1 MG tablet  Take 1 mg by mouth at bedtime as needed for anxiety or sleep.     Marland Kitchen  aspirin 81 MG chewable tablet  Chew 1 tablet (81 mg total) by mouth daily.     Marland Kitchen  atorvastatin (LIPITOR) 20 MG  tablet  Take 1 tablet (20 mg total) by mouth daily with supper.  30 tablet  1   .  buPROPion (WELLBUTRIN XL) 150 MG 24 hr tablet  Take 1 tablet (150 mg total) by mouth every morning.  30 tablet  11   .  carvedilol (COREG) 6.25 MG tablet  Take 1 tablet (6.25 mg total) by mouth 2 (two) times daily with  a meal.  60 tablet  11   .  cyanocobalamin (,VITAMIN B-12,) 1000 MCG/ML injection  Inject 1,000 mcg into the muscle every 30 (thirty) days.     .  Esomeprazole Magnesium (NEXIUM PO)  Take 1 capsule by mouth daily.     .  feeding supplement (ENSURE COMPLETE) LIQD  Take 237 mLs by mouth daily.  30 Bottle  0   .  ferrous sulfate 325 (65 FE) MG tablet  Take 325 mg by mouth daily with breakfast.     .  furosemide (LASIX) 20 MG tablet  Take 1 tablet (20 mg total) by mouth daily as needed (take if weight goes up by 2 lbs overnight or 5 lbs in a week.).  30 tablet  1   .  Glucosamine HCl (GLUCOSAMINE PO)  Take 1 tablet by mouth daily.     Marland Kitchen  levothyroxine (SYNTHROID, LEVOTHROID) 125 MCG tablet  Take 1 tablet (125 mcg total) by mouth daily.  30 tablet  11   .  loperamide (IMODIUM) 2 MG capsule  Take 2-4 mg by mouth 2 (two) times daily. $RemoveBefo'2mg'NSJcxgAcsIB$  in the morning and $RemoveBef'4mg'reJdwexzpx$  at night     .  mycophenolate (CELLCEPT) 500 MG tablet  Take 2 tablets (1,000 mg total) by mouth 2 (two) times daily.  60 tablet  1   .  potassium chloride (K-DUR) 10 MEQ tablet  Take 20 mEq by mouth 2 (two) times daily.     .  Probiotic Product (PROBIOTIC DAILY PO)  Take 1 tablet by mouth daily.      Home:  Home Living  Family/patient expects to be discharged to:: Private residence  Living Arrangements: Children  Available Help at Discharge: Family;Available 24 hours/day  Type of Home: House  Home Access: Level entry  Home Layout: Two level;Able to live on main level with bedroom/bathroom  Home Equipment: Walker - 4 wheels;Shower seat - built in;Grab bars - toilet;Bedside commode  Additional Comments: has a built in shower seat and tub shower   Functional History:  Prior Function  Comments: ambulates with rollator  Functional Status:  Mobility:    Ambulation/Gait  Ambulation Distance (Feet): 8 Feet  General Gait Details: Verbal cues for safe use of RW and gait sequence. Patient able to maneuver RW straight ahead. Able to ambulate 8' with encouragement. Fatigued quickly.   ADL:  ADL  Eating/Feeding: Set up  Where Assessed - Eating/Feeding: Bed level  Grooming: Set up;Supervision/safety  Where Assessed - Grooming: Supported sitting  Upper Body Bathing: Supervision/safety;Set up  Where Assessed - Upper Body Bathing: Supported sitting  Lower Body Bathing: Maximal assistance  Where Assessed - Lower Body Bathing: Supported sit to stand  Upper Body Dressing: Moderate assistance  Where Assessed - Upper Body Dressing: Supported sitting  Lower Body Dressing: +1 Total assistance  Where Assessed - Lower Body Dressing: Supported sit to Retail buyer: Moderate assistance  Toilet Transfer Method: Stand pivot  Toilet Transfer Equipment: (Bed to recliner going to pt's right)  Equipment Used: Gait belt;Rolling walker  Transfers/Ambulation Related to ADLs: Mod A sit<stand and stand pivot  Cognition:  Cognition  Overall Cognitive Status: Within Functional Limits for tasks assessed  Orientation Level: Oriented X4  Cognition  Arousal/Alertness: Awake/alert  Behavior During Therapy: WFL for tasks assessed/performed  Overall Cognitive Status: Within Functional Limits for tasks assessed    Physical Exam:  Blood pressure 129/52, pulse 66, temperature 97.8 F (36.6 C), temperature source Oral, resp. rate 18, height $RemoveBe'5\' 4"'ElwxpwIrg$  (  1.626 m), weight 66.9 kg (147 lb 7.8 oz), SpO2 100.00%.  Constitutional: She is oriented to person, place, and time.  Frail 80 year old female.  HENT:  Head: Normocephalic.  Eyes: EOM are normal.  Neck: Normal range of motion. Neck supple. No thyromegaly present.  Cardiovascular: Normal rate and regular  rhythm. No murmurs. No gallops Respiratory: Effort normal and breath sounds normal. No respiratory distress. No wheezes, rales GI: Soft. Bowel sounds are normal. She exhibits no distension.  Neurological: She is alert and oriented to person, place, and time.  Follows simple commands. UE 4-prox with deltoid, bicep to 4 distally. LE's 1  with LEFt HF (pain component), RIGHT HF 3. KE 2- left, 2+ right, ankles 3/5. Decreased LT at both feet to about the malleoli, no ptosis, no dsyarthria, CN exam intact  Skin: gluteal/perineal/waist line with a few patches of healing round lesions. Appear to be resolving herpetic lesions. Area generally clean, granulating, no unopened vesicles. Psychiatric: She has a normal mood and affect. Her behavior is normal. Judgment and thought content normal    Results for orders placed during the hospital encounter of 06/11/13 (from the past 48 hour(s))   BASIC METABOLIC PANEL Status: Abnormal    Collection Time    06/18/13 5:00 AM   Result  Value  Range    Sodium  132 (*)  137 - 147 mEq/L    Potassium  4.7  3.7 - 5.3 mEq/L    Chloride  105  96 - 112 mEq/L    CO2  20  19 - 32 mEq/L    Glucose, Bld  97  70 - 99 mg/dL    BUN  21  6 - 23 mg/dL    Creatinine, Ser  0.85  0.50 - 1.10 mg/dL    Calcium  7.0 (*)  8.4 - 10.5 mg/dL    GFR calc non Af Amer  60 (*)  >90 mL/min    GFR calc Af Amer  69 (*)  >90 mL/min    Comment:  (NOTE)     The eGFR has been calculated using the CKD EPI equation.     This calculation has not been validated in all clinical situations.     eGFR's persistently <90 mL/min signify possible Chronic Kidney     Disease.   CBC Status: Abnormal    Collection Time    06/18/13 5:00 AM   Result  Value  Range    WBC  2.4 (*)  4.0 - 10.5 K/uL    RBC  3.00 (*)  3.87 - 5.11 MIL/uL    Hemoglobin  9.1 (*)  12.0 - 15.0 g/dL    HCT  27.4 (*)  36.0 - 46.0 %    MCV  91.3  78.0 - 100.0 fL    MCH  30.3  26.0 - 34.0 pg    MCHC  33.2  30.0 - 36.0 g/dL    RDW   15.6 (*)  11.5 - 15.5 %    Platelets  95 (*)  150 - 400 K/uL    Comment:  CONSISTENT WITH PREVIOUS RESULT   BASIC METABOLIC PANEL Status: Abnormal    Collection Time    06/19/13 5:07 AM   Result  Value  Range    Sodium  134 (*)  137 - 147 mEq/L    Potassium  4.4  3.7 - 5.3 mEq/L    Chloride  107  96 - 112 mEq/L    CO2  20  19 -  32 mEq/L    Glucose, Bld  119 (*)  70 - 99 mg/dL    BUN  21  6 - 23 mg/dL    Creatinine, Ser  0.83  0.50 - 1.10 mg/dL    Calcium  6.9 (*)  8.4 - 10.5 mg/dL    GFR calc non Af Amer  62 (*)  >90 mL/min    GFR calc Af Amer  71 (*)  >90 mL/min    Comment:  (NOTE)     The eGFR has been calculated using the CKD EPI equation.     This calculation has not been validated in all clinical situations.     eGFR's persistently <90 mL/min signify possible Chronic Kidney     Disease.   CBC Status: Abnormal    Collection Time    06/19/13 5:07 AM   Result  Value  Range    WBC  2.3 (*)  4.0 - 10.5 K/uL    RBC  2.84 (*)  3.87 - 5.11 MIL/uL    Hemoglobin  8.6 (*)  12.0 - 15.0 g/dL    HCT  25.9 (*)  36.0 - 46.0 %    MCV  91.2  78.0 - 100.0 fL    MCH  30.3  26.0 - 34.0 pg    MCHC  33.2  30.0 - 36.0 g/dL    RDW  15.7 (*)  11.5 - 15.5 %    Platelets  84 (*)  150 - 400 K/uL    Comment:  CONSISTENT WITH PREVIOUS RESULT    No results found.  Post Admission Physician Evaluation:  1. Functional deficits secondary to likely Guillain Barre Syndrome. 2. Patient is admitted to receive collaborative, interdisciplinary care between the physiatrist, rehab nursing staff, and therapy team. 3. Patient's level of medical complexity and substantial therapy needs in context of that medical necessity cannot be provided at a lesser intensity of care such as a SNF. 4. Patient has experienced substantial functional loss from his/her baseline which was documented above under the "Functional History" and "Functional Status" headings. Judging by the patient's diagnosis, physical exam, and functional  history, the patient has potential for functional progress which will result in measurable gains while on inpatient rehab. These gains will be of substantial and practical use upon discharge in facilitating mobility and self-care at the household level. 5. Physiatrist will provide 24 hour management of medical needs as well as oversight of the therapy plan/treatment and provide guidance as appropriate regarding the interaction of the two. 6. 24 hour rehab nursing will assist with bladder management, bowel management, safety, skin/wound care, disease management, medication administration, pain management and patient education and help integrate therapy concepts, techniques,education, etc. 7. PT will assess and treat for/with: Lower extremity strength, range of motion, stamina, balance, functional mobility, safety, adaptive techniques and equipment, NMR, pain mgt, family education. Goals are: supervision to min assist. 8. OT will assess and treat for/with: ADL's, functional mobility, safety, upper extremity strength, adaptive techniques and equipment, pain mgt, NMR, education, egosupport. Goals are: supervision to min assist. 9. SLP will assess and treat for/with: n/a. Goals are: n/a. 10. Case Management and Social Worker will assess and treat for psychological issues and discharge planning. 11. Team conference will be held weekly to assess progress toward goals and to determine barriers to discharge. 12. Patient will receive at least 3 hours of therapy per day at least 5 days per week. 13. ELOS: 14-18 days  14. Prognosis: excellent   Medical Problem  List and Plan:  1. Suspect GBS. ? Myasthenia gravis. IVIG completed. Continue CellCept  2. DVT Prophylaxis/Anticoagulation: History of DVT with IVC filter  3. Pain Management: Hydrocodone as needed. Monitor with increased mobility   -add lidoderm patch for left low back/hip pain---suspect pain is from lumbar spondylosis/referral 3.  Mood/anxiety/depression: Wellbutrin 150 mg daily, Xanax 0.25 mg twice daily as needed and 1 mg each bedtime as needed  4. Neuropsych: This patient is capable of making decisions on her own behalf.  5. Hypothyroidism. Synthroid  6. Hyperlipidemia. Lipitor  7. Chronic anemia/thrombocytopenia. Latest hemoglobin 8.6 and platelet count 84,000 improved from 67,000. Continue iron supplement followup CBC  8. Sacral wounds. Have requested [place] RN input  -I believe these are evolving herpetic lesions.  -by their appearance it's really too late to treat.  -advise allevyn dressings for now to keep wounds clean free of moisture/soiling  -air mattress 9. Chronic loose stool: probiotic/diet  [name], MD, [facility]   06/19/2013

## 2013-06-20 ENCOUNTER — Inpatient Hospital Stay (HOSPITAL_COMMUNITY): Payer: Medicare Other | Admitting: Physical Therapy

## 2013-06-20 ENCOUNTER — Inpatient Hospital Stay (HOSPITAL_COMMUNITY): Payer: Medicare Other

## 2013-06-20 DIAGNOSIS — G610 Guillain-Barre syndrome: Secondary | ICD-10-CM

## 2013-06-20 LAB — CBC WITH DIFFERENTIAL/PLATELET
BASOS ABS: 0 10*3/uL (ref 0.0–0.1)
Basophils Relative: 1 % (ref 0–1)
EOS PCT: 1 % (ref 0–5)
Eosinophils Absolute: 0 10*3/uL (ref 0.0–0.7)
HCT: 29.7 % — ABNORMAL LOW (ref 36.0–46.0)
Hemoglobin: 10.2 g/dL — ABNORMAL LOW (ref 12.0–15.0)
LYMPHS ABS: 0.3 10*3/uL — AB (ref 0.7–4.0)
Lymphocytes Relative: 10 % — ABNORMAL LOW (ref 12–46)
MCH: 30.8 pg (ref 26.0–34.0)
MCHC: 34.3 g/dL (ref 30.0–36.0)
MCV: 89.7 fL (ref 78.0–100.0)
Monocytes Absolute: 0.2 10*3/uL (ref 0.1–1.0)
Monocytes Relative: 6 % (ref 3–12)
NEUTROS ABS: 2.5 10*3/uL (ref 1.7–7.7)
Neutrophils Relative %: 82 % — ABNORMAL HIGH (ref 43–77)
PLATELETS: 81 10*3/uL — AB (ref 150–400)
RBC: 3.31 MIL/uL — AB (ref 3.87–5.11)
RDW: 15.5 % (ref 11.5–15.5)
WBC: 3 10*3/uL — AB (ref 4.0–10.5)

## 2013-06-20 LAB — COMPREHENSIVE METABOLIC PANEL
ALK PHOS: 75 U/L (ref 39–117)
ALT: 14 U/L (ref 0–35)
AST: 22 U/L (ref 0–37)
Albumin: 1.3 g/dL — ABNORMAL LOW (ref 3.5–5.2)
BILIRUBIN TOTAL: 0.4 mg/dL (ref 0.3–1.2)
BUN: 17 mg/dL (ref 6–23)
CHLORIDE: 107 meq/L (ref 96–112)
CO2: 20 meq/L (ref 19–32)
Calcium: 7.2 mg/dL — ABNORMAL LOW (ref 8.4–10.5)
Creatinine, Ser: 0.79 mg/dL (ref 0.50–1.10)
GFR, EST AFRICAN AMERICAN: 84 mL/min — AB (ref >90)
GFR, EST NON AFRICAN AMERICAN: 73 mL/min — AB (ref >90)
GLUCOSE: 89 mg/dL (ref 70–99)
POTASSIUM: 4.6 meq/L (ref 3.7–5.3)
SODIUM: 137 meq/L (ref 137–147)
Total Protein: 5.1 g/dL — ABNORMAL LOW (ref 6.0–8.3)

## 2013-06-20 LAB — CERULOPLASMIN: CERULOPLASMIN: 17 mg/dL — AB (ref 20–60)

## 2013-06-20 MED ORDER — SACCHAROMYCES BOULARDII 250 MG PO CAPS
500.0000 mg | ORAL_CAPSULE | Freq: Two times a day (BID) | ORAL | Status: DC
  Administered 2013-06-20 – 2013-06-23 (×7): 500 mg via ORAL
  Filled 2013-06-20 (×10): qty 2

## 2013-06-20 MED ORDER — SODIUM CHLORIDE 0.9 % IJ SOLN
10.0000 mL | INTRAMUSCULAR | Status: DC | PRN
  Administered 2013-06-20 – 2013-06-27 (×6): 10 mL
  Administered 2013-06-28: 20 mL
  Administered 2013-06-29 – 2013-07-06 (×2): 10 mL

## 2013-06-20 MED ORDER — ALTEPLASE 2 MG IJ SOLR
2.0000 mg | Freq: Once | INTRAMUSCULAR | Status: AC
  Administered 2013-06-20: 2 mg
  Filled 2013-06-20: qty 2

## 2013-06-20 NOTE — Progress Notes (Signed)
Subjective/Complaints: A little disoriented this morning. Recalled a disjointed story from last night having to do with her family. Thought it was still evening. Still with loose stool A 12 point review of systems has been performed and if not noted above is otherwise negative.   Objective: Vital Signs: Blood pressure 104/70, pulse 79, temperature 97.9 F (36.6 C), temperature source Oral, resp. rate 18, height 5\' 4"  (1.626 m), SpO2 99.00%. No results found.  Recent Labs  06/18/13 0500 06/19/13 0507  WBC 2.4* 2.3*  HGB 9.1* 8.6*  HCT 27.4* 25.9*  PLT 95* 84*    Recent Labs  06/18/13 0500 06/19/13 0507  NA 132* 134*  K 4.7 4.4  CL 105 107  GLUCOSE 97 119*  BUN 21 21  CREATININE 0.85 0.83  CALCIUM 7.0* 6.9*   CBG (last 3)  No results found for this basename: GLUCAP,  in the last 72 hours  Wt Readings from Last 3 Encounters:  06/17/13 66.9 kg (147 lb 7.8 oz)  05/31/13 70.761 kg (156 lb)  05/07/13 74.39 kg (164 lb)    Physical Exam:  Constitutional: She is oriented to person, place, and time.  Frail 80 year old female.  HENT:  Head: Normocephalic.  Eyes: EOM are normal.  Neck: Normal range of motion. Neck supple. No thyromegaly present.  Cardiovascular: Normal rate and regular rhythm. No murmurs. No gallops  Respiratory: Effort normal and breath sounds normal. No respiratory distress. No wheezes, rales  GI: Soft. Bowel sounds are normal. She exhibits no distension.  Neurological: She is alert and oriented to place. Confused to time and situation  Follows simple commands. UE 4-prox with deltoid, bicep to 4 distally. LE's 1 with LEFt HF (pain component), RIGHT HF 3. KE 2- left, 2+ right, ankles 3/5. Decreased LT at both feet to about the malleoli, no ptosis, no dsyarthria, CN exam intact  Skin: persistent gluteal/perineal/waist line with a few patches of healing round lesions. Appear to be resolving herpetic lesions. Area generally clean, granulating, no  unopened vesicles. Psychiatric: She has a normal mood and affect. Her behavior is normal. Judgment and thought content normal      Assessment/Plan: 1. Functional deficits secondary to Guillain Barre Syndrome which require 3+ hours per day of interdisciplinary therapy in a comprehensive inpatient rehab setting. Physiatrist is providing close team supervision and 24 hour management of active medical problems listed below. Physiatrist and rehab team continue to assess barriers to discharge/monitor patient progress toward functional and medical goals. FIM:                                 Medical Problem List and Plan:  1. Suspect GBS. (? Myasthenia gravis). IVIG completed. Continue CellCept  2. DVT Prophylaxis/Anticoagulation: History of DVT with IVC filter  3. Pain Management: Hydrocodone as needed. Monitor with increased mobility  -added lidoderm patch for left low back/hip pain---suspect pain is from lumbar spondylosis/referral  3. Mood/anxiety/depression: Wellbutrin 150 mg daily, Xanax 0.25 mg twice daily as needed and 1 mg each bedtime as needed  4. Neuropsych: This patient is capable of making decisions on her own behalf.  5. Hypothyroidism. Synthroid  6. Hyperlipidemia. Lipitor  7. Chronic anemia/thrombocytopenia. Latest hemoglobin 8.6 and platelet count 84,000 improved from 67,000. Continue iron supplement followup CBC  8. Sacral wounds. Have requested [facility] RN input  -these are likely herpetic lesions/ HSV  -by their appearance it's really  too late to treat.  -advise allevyn dressings for now to keep wounds clean free of moisture/soiling  -air mattress  9. Chronic loose stool: probiotic/diet control   LOS (Days) 1 A FACE TO FACE EVALUATION WAS PERFORMED  SWARTZ,ZACHARY T 06/20/2013 7:53 AM

## 2013-06-20 NOTE — Progress Notes (Signed)
Patient information reviewed and entered into eRehab system by Karington Zarazua, RN, CRRN, PPS Coordinator.  Information including medical coding and functional independence measure will be reviewed and updated through discharge.     Per nursing patient was given "Data Collection Information Summary for Patients in Inpatient Rehabilitation Facilities with attached "Privacy Act Statement-Health Care Records" upon admission.  

## 2013-06-20 NOTE — Plan of Care (Signed)
Problem: RH BLADDER ELIMINATION Goal: RH STG MANAGE BLADDER WITH ASSISTANCE STG Manage Bladder With Mod I Assistance  Outcome: Not Progressing Incontinent due to urgency

## 2013-06-20 NOTE — Evaluation (Signed)
Physical Therapy Assessment and Plan  Patient Details  Name: [name] MRN: [id number] Date of Birth: 09-07-1924  PT Diagnosis: Abnormal posture, Coordination disorder, Difficulty walking, Impaired cognition, Impaired sensation and Muscle weakness Rehab Potential: Good ELOS: 14-19 days   Today's Date: 06/20/2013 Time: 8099-8338 Time Calculation (min): 65 min  Problem List:  Patient Active Problem List   Diagnosis Date Noted  . GBS (Guillain-Barre syndrome) 06/19/2013  . Weakness 06/11/2013  . Proximal muscle weakness 06/11/2013  . Numbness of legs 06/11/2013  . Cardiomyopathy 04/22/2013  . Physical deconditioning 04/02/2013  . Pedal edema 04/01/2013  . Chronic systolic heart failure 25/09/3974  . Pulmonary hypertension 03/27/2013  . Hypokalemia 03/27/2013  . SBO (small bowel obstruction) 03/25/2013  . Ulcerative colitis   . Protein-calorie malnutrition, severe 03/24/2013  . Anemia of chronic disease 03/23/2013  . Acute respiratory failure with hypoxia 03/23/2013  . Atrial fibrillation with RVR 03/22/2013  . N&V (nausea and vomiting) 03/22/2013  . 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 Incisional hernia, incarcerated s/p lap repair 03/25/2013 03/22/2013  . NSTEMI (non-ST elevated myocardial infarction) 03/22/2013  . Acute renal failure 12/07/2012  . ANXIETY 12/26/2007  . HYPOTHYROIDISM 11/10/2006    Past Medical History:  Past Medical History  Diagnosis Date  . Anemia   . Hyperlipemia   . Hypertension   . Thyroid disease     hypothyroidism  . Anxiety   . Vitamin B 12 deficiency   . TIA (transient ischemic attack)     4-09  . Microscopic hematuria     benign microscopic hematuria, worked up b Dr Katrine Coho  . Gall stone     porcelain gall bladder with single stone  per CT 02-2009 Dr Newton Pigg  . Osteoporosis     DEXA on 04-30-10  . Myasthenia gravis     sees Dr. Krista Blue   . Ulcerative colitis   . DVT of leg (deep venous  thrombosis) 12/2012    left leg; coumadin d/c b/c of LGI bleed; s/p IVC filter  . CMV colitis   . Cancer     uterine/ovarian  . Chronic systolic CHF (congestive heart failure)   . Cardiomyopathy     a. in setting of incarcerated hernia, AF with RVR, ARF, NSTEMI => probable [place] CM given WM abnormality; Echocardiogram (03/23/13): Mod LVH, EF 30-35% with AK of the entire apical myocardium, AS/AL/Inf HK, Gr 1 DD, aortic sclerosis, MAC, mild MR, atrial septal lipomatous hypertrophy, mod TR, PASP 37;   Echo (04/2013): EF 60-65% normal wall motion, Gr 1 DD, MAC, mild to mod LAE  . Paroxysmal atrial fibrillation     not on coumadin due to AF in setting of acute illness as well as prior GI bleed on coumadin for DVT  . NSTEMI (non-ST elevated myocardial infarction)     a. 03/2013 in setting of incarcerated ventral hernia, SBO, ARF => likely Type 2 NSTEMI   Past Surgical History:  Past Surgical History  Procedure Laterality Date  . Cardiac catheterization      04/1999  . Abdominal hysterectomy      with oophorectomy  . Colonoscopy  1999    normal   . Colonoscopy N/A 12/10/2012    Procedure: COLONOSCOPY;  Surgeon: Gatha Mayer, MD;  Location: [facility];  Service: Endoscopy;  Laterality: N/A;  . Flexible sigmoidoscopy N/A 01/26/2013    Procedure: FLEXIBLE SIGMOIDOSCOPY;  Surgeon: Gatha Mayer, MD;  Location: [facility];  Service: Endoscopy;  Laterality: N/A;  .  Ventral hernia repair N/A 03/25/2013    Procedure: LAPAROSCOPIC VENTRAL HERNIA REPAIR ;  Surgeon: Adin Hector, MD;  Location: [facility];  Service: General;  Laterality: N/A;  . Cholecystectomy N/A 03/25/2013    Procedure: LAPAROSCOPIC CHOLECYSTECTOMY WITH INTRAOPERATIVE CHOLANGIOGRAM;  Surgeon: Adin Hector, MD;  Location: [facility];  Service: General;  Laterality: N/A;  . Insertion of mesh N/A 03/25/2013    Procedure: INSERTION OF MESH;  Surgeon: Adin Hector, MD;  Location: [facility];  Service: General;  Laterality: N/A;  .  Laparoscopic lysis of adhesions N/A 03/25/2013    Procedure: LAPAROSCOPIC LYSIS OF ADHESIONS;  Surgeon: Adin Hector, MD;  Location: [facility];  Service: General;  Laterality: N/A;  . Omentectomy N/A 03/25/2013    Procedure: PARTIAL OMENTECTOMY;  Surgeon: Adin Hector, MD;  Location: [facility];  Service: General;  Laterality: N/A;    Assessment & Plan Clinical Impression: [name] is a 80 y.o. right-handed female with history of myasthenia gravis on CellCept, uterine cancer, DVT with IVC filter, GI bleed. Patient well known to rehabilitation services from admission November 2014 for deconditioning related to incarcerated hernia. Patient has been living with her daughter and son-in-law completing her ADLs minimal assistance using a walker for ambulation. Admitted 06/11/2013 with progressive weakness over the last 2 days involving the lower extremities with numbness. MRI of the brain negative for any acute changes. MRI cervical and thoracic spine is unremarkable except for some degenerative disc. MRI lumbar spine with no neural impingement. Neurology consulted possibilities included myasthenia gravis causing lower extremity fatigability versus possible GBS and placed on IVIG x5 days completed 06/18/2013. Venous Doppler studies bilateral lower extremity showed DVT common femoral, profunda, femoral, popliteal, posterior tibial and perineal veins. Patient does have an IVC filter in place. Oncology services consulted for thrombocytopenia question HIT which was ruled out and platelets continue to trend upward and monitored. Patient's latest platelet count 84,000 hemoglobin 8.6. Patient with sacral wound and air mattress overlay in place with wound care of allevyn to promote healing change every 3 days as needed. Patient transferred to CIR on 06/19/2013 .   Very limited eval due to impaired cognition (repeatedly thought family was in room when only therapist present), decreased ability to follow one-step  commands, and lightheadedness with position change. Patient currently requires max to total assist with mobility secondary to muscle weakness, impaired timing and sequencing, unbalanced muscle activation, ataxia and decreased coordination, decreased attention, decreased awareness, decreased memory and delayed processing and decreased sitting balance, impaired balance strategies.   Prior to hospitalization, patient was supervision with mobility and lived with Daughter in a House home.  Home access is  Level entry.  Patient will benefit from skilled PT intervention to maximize safe functional mobility, minimize fall risk and decrease caregiver burden for planned discharge home with 24 hour assist.  Anticipate patient will benefit from follow up [facility] at discharge.  PT - End of Session Activity Tolerance: Decreased this session Endurance Deficit: Yes PT Assessment Rehab Potential: Good PT Patient demonstrates impairments in the following area(s): Balance;Endurance;Pain;Sensory;Safety PT Transfers Functional Problem(s): Bed Mobility;Bed to Chair;Car;Furniture PT Locomotion Functional Problem(s): Ambulation;Wheelchair Mobility PT Plan PT Intensity: Minimum of 1-2 x/day ,45 to 90 minutes PT Frequency: 5 out of 7 days PT Duration Estimated Length of Stay: 14-19 days PT Treatment/Interventions: Ambulation/gait training;Balance/vestibular training;Cognitive remediation/compensation;Disease management/prevention;Discharge planning;DME/adaptive equipment instruction;Neuromuscular re-education;Pain management;Functional mobility training;Patient/family education;Therapeutic Exercise;Splinting/orthotics;Skin care/wound management;Therapeutic Activities;UE/LE Coordination activities;UE/LE Strength taining/ROM;Psychosocial support;Wheelchair propulsion/positioning PT Transfers Anticipated Outcome(s): Min assist PT  Locomotion Anticipated Outcome(s): Mod assist short distances PT Recommendation Follow Up  Recommendations: Home health PT;24 hour supervision/assistance Patient destination: Home Equipment Recommended: To be determined  Skilled Therapeutic Intervention  Session limited by pt's lightheadedness and cognitive impairments - see vitals below. Requested order for TEDs. RN aware of lightheadedness. Performed 10 reps of bridging for strengthening and rolling working on [place]. Pt unable to remain sitting EOB, was extremely lightheaded (reported she did not feel safe with transfer), and had difficulty following therapist's commands therefore deemed unsafe to transfer to w/c this morning. RN reporting pt may still be effected by medication received last night. Will continue to assess in afternoon session.     PT Evaluation Precautions/Restrictions Precautions Precautions: Fall Restrictions Weight Bearing Restrictions: No General   Vital SignsTherapy Vitals BP: 136/79 mmHg Patient Position, if appropriate: Lying (after sitting, unable to get read sitting) Oxygen Therapy SpO2:  (unable to get accurate read ) Pain Pain Assessment Pain Assessment: No/denies pain Faces Pain Scale: Hurts little more Pain Type: Acute pain Pain Location: Leg Pain Orientation: Left Pain Descriptors / Indicators: Aching Pain Onset: With Activity Pain Intervention(s): Repositioned;Rest Home Living/Prior Functioning Home Living Available Help at Discharge: Family Type of Home: House Home Access: Level entry Home Layout: Two level;Able to live on main level with bedroom/bathroom Additional Comments: has a built in shower seat and tub shower  Lives With: Daughter and son-in-law Prior Function - reports no h/o falls since last admission Level of Independence: Needs assistance with ADLs Bath: Minimal Dressing: Minimal Driving: No Vocation: Retired Leisure: Hobbies-no Comments: ambulates with rollator  Vision/Perception  Vision - History Baseline Vision: Wears glasses only for reading Patient  Visual Report: No change from baseline Vision - Assessment Eye Alignment: Within Functional Limits Perception Perception: Within Functional Limits Praxis Praxis: Intact  Cognition Overall Cognitive Status: Impaired/Different from baseline Arousal/Alertness: Suspect due to medications Orientation Level: Oriented to person;Disoriented to situation;Disoriented to time;Oriented to place Attention: Focused Focused Attention: Appears intact Sustained Attention: Impaired Sustained Attention Impairment: Verbal complex;Functional complex Memory: Impaired Memory Impairment: Decreased short term memory;Decreased recall of new information Decreased Short Term Memory: Functional basic;Verbal complex Awareness: Impaired Awareness Impairment: Intellectual impairment Problem Solving: Impaired Problem Solving Impairment: Functional basic Executive Function: Reasoning;Organizing;Decision Making;Self Correcting Reasoning: Impaired Reasoning Impairment: Verbal complex Organizing: Impaired Organizing Impairment: Functional basic;Verbal complex Decision Making: Impaired Decision Making Impairment: Verbal complex;Functional basic Self Correcting: Impaired Self Correcting Impairment: Functional basic Behaviors: Restless Safety/Judgment: Impaired Comments: Pt reports hearing daughter in her room asking questions, thinks daughter then son-in-law in room. Does not answer questions appropriately. Difficulty following one step commands.  Sensation Sensation Light Touch: Impaired Detail Light Touch Impaired Details: Impaired LLE;Impaired RLE Stereognosis: Appears Intact Hot/Cold: Appears Intact Proprioception: Appears Intact Proprioception Impaired Details: Impaired RUE;Impaired LUE;Impaired RLE;Impaired LLE Coordination Gross Motor Movements are Fluid and Coordinated: Yes Fine Motor Movements are Fluid and Coordinated: Yes Coordination and Movement Description: Able to comb hair, don/doff glasses,  don shirt, self-feed pudding, drink, and flip pages while reading magazine during later treatment session Motor  Motor Motor: Within Functional Limits  Mobility Bed Mobility Bed Mobility: Rolling Right;Supine to Sit;Sitting - Scoot to Edge of Bed Rolling Right: 3: Mod assist Rolling Right Details: Manual facilitation for placement Rolling Right Details (indicate cue type and reason): Mod/max verbal cues for sequencing, required verbal and tactile cues to release railing to roll side/side Rolling Left: 3: Mod assist Supine to Sit: 2: Max assist Supine to Sit Details: Manual facilitation for placement Supine to  Sit Details (indicate cue type and reason): Requires assist to upright trunk and assist LEs. Pt immediately lightheaded.  Sitting - Scoot to Edge of Bed: 3: Mod assist Sitting - Scoot to [employer] of Bed Details: Manual facilitation for weight shifting Sit to Supine: HOB flat;2: Max assist Scooting to [facility]: 3: Mod assist Scooting to [facility] Details (indicate cue type and reason): THrough bridging of LEs and pulling on rails. significant time to complete tasks.  Transfers Transfers: No (deemed unsafe due to cognition, lightheadedness) Sit to Stand: 2: Max assist Sit to Stand Details: Manual facilitation for weight bearing;Verbal cues for precautions/safety Stand to Sit: 3: Mod assist Stand to Sit Details (indicate cue type and reason): Manual facilitation for placement Locomotion  Ambulation Ambulation: No (unsafe due to decreased ability to follow comnands, and lightheadedness. )  Trunk/Postural Assessment  Cervical Assessment Cervical Assessment: Within Functional Limits Thoracic Assessment Thoracic Assessment: Exceptions to [facility] (kyphotics) Lumbar Assessment Lumbar Assessment: Exceptions to [facility] (posterior pelvic tilt) Postural Control Postural Control: Deficits on evaluation Trunk Control: poor trunk control on eval, ?medication related Righting Reactions: Decreased on eval   Balance Dynamic Sitting Balance Sitting balance - Comments: Pt sat EOB x 10 min, balance progressive got worse, posterior lean and lateral losses of balance. mod assist for balance Extremity Assessment  RUE Assessment RUE Assessment: Within Functional Limits LUE Assessment LUE Assessment: Within Functional Limits RLE Assessment RLE Assessment: Exceptions to [facility] RLE Strength RLE Overall Strength Comments: Unable to formally test due to decreased ability to follow commands however significant weakness noted, grossly >/= 2/5. Functionally pt requires assist to progress LEs over EOB. Reports sensation loss knee and below worse at toes. LLE Assessment LLE Assessment: Exceptions to [facility] LLE Strength LLE Overall Strength Comments: Unable to formally test due to decreased ability to follow commands however significant weakness noted, grossly >/= 2/5. Functionally pt requires assist to progress LEs over EOB. Reports sensation loss knee and below worse at toes.  FIM:  FIM - Control and instrumentation engineer Devices: Arm rests;Bed rails Bed/Chair Transfer: 2: Supine > Sit: Max A (lifting assist/Pt. 25-49%);2: Sit > Supine: Max A (lifting assist/Pt. 25-49%)   Refer to Care Plan for Long Term Goals  Recommendations for other services: None  Discharge Criteria: Patient will be discharged from PT if patient refuses treatment 3 consecutive times without medical reason, if treatment goals not met, if there is a change in medical status, if patient makes no progress towards goals or if patient is discharged from hospital.  The above assessment, treatment plan, treatment alternatives and goals were discussed and mutually agreed upon: by patient  Lahoma Rocker 06/20/2013, 12:30 PM

## 2013-06-20 NOTE — Progress Notes (Addendum)
INITIAL NUTRITION ASSESSMENT  DOCUMENTATION CODES Per approved criteria  -Severe malnutrition in the context of chronic illness   INTERVENTION: Continue Ensure Complete BID, each supplement provides 350 kcals, 13 grams protein. Continue Multivitamin daily. RD to continue to follow nutrition care plan.  NUTRITION DIAGNOSIS: Predicted suboptimal energy intake related to poor appetite as evidenced by patient dietary recall and weight loss.   Goal: Patient to meet >/= 90% of estimated nutrition needs  Monitor:  Weight trends, lab trends, I/Os, PO intake and supplement acceptance  Reason for Assessment: Malnutrition Screening Tool Risk  80 y.o. female  Admitting Dx: GBS (Guillain-Barre syndrome)  ASSESSMENT: Patient is an 80 y.o. Female with PMH of myasthenia gravis on cellcept, uterine cancer, DVT s/p IVC filter because of GIB, hypothyroidism, NICM, PAF, anxiety, anemia, and vitamin B12 deficiency. Patient was admitted to [facility] ED on 1/20 complaining of progressive muscle weakness over the previous 3 days. Patient was transferred to [facility] later on 1/20. Work-up revealed myasthenia gravis causing lower extremity fatigability versus possible GBS.  Pt admitted to inpatient rehab on 1/28.  Patient reported that her usual weight was 220 lb and the last time she weighed this amount was in October 2013. She reports that she has lost 70 pounds in the past year due to a poor appetite. Per medical records, the patient has had a 10% weight loss since early December. Pt has lost and additiona 9 lb during acute inpatient stay.  The patient stated that she usually eats 3 meals a day, but the meals she described were small and nutritionally inadequate (breakfast-cereal, lunch-soup, dinner-mashed potatoes). She reports that she tries to drink 2 Ensure Completes per week while she is at home.   Pt was followed by RD staff during acute hospitalization. Pt with variable intake of meals during  acute hospitalization, would take Ensure Complete supplements - prefers Strawberry. Patient is now having loose stools. Ordered for [employer]. Pt reports that overall, her intake has improved since acute admission.  WOC RN saw pt as she currently has 7 stage II pressure ulcers to her sacrum.  Nutrition Focused Physical Exam:  Subcutaneous Fat:  Orbital Region: WNL Upper Arm Region: mild depletion Thoracic and Lumbar Region: moderate depletion  Muscle:  Temple Region: moderate depletion Clavicle Bone Region: moderate depletion Clavicle and Acromion Bone Region: severe depletion Scapular Bone Region: severe depletion Dorsal Hand: WNL Patellar Region: n/a Anterior Thigh Region: n/a Posterior Calf Region: n/a  Edema: n/a  Patient meets criteria for severe malnutrition in the context of chronic illness as evidenced by </= 75% energy intake for >/= 1 month, severe muscle mass depletion, and > 8% weight loss in 3 months.    Height: Ht Readings from Last 1 Encounters:  06/19/13 5\' 4"  (1.626 m)    Weight: Wt Readings from Last 1 Encounters:  06/17/13 147 lb 7.8 oz (66.9 kg)    Ideal Body Weight: 120 lb  % Ideal Body Weight: 123%  Wt Readings from Last 10 Encounters:  06/17/13 147 lb 7.8 oz (66.9 kg)  05/31/13 156 lb (70.761 kg)  05/07/13 164 lb (74.39 kg)  04/30/13 166 lb 8 oz (75.524 kg)  04/22/13 172 lb (78.019 kg)  04/17/13 168 lb 12.8 oz (76.567 kg)  04/11/13 174 lb 9.7 oz (79.2 kg)  04/02/13 181 lb 3.5 oz (82.2 kg)  04/02/13 181 lb 3.5 oz (82.2 kg)  03/18/13 159 lb 12 oz (72.462 kg)    Usual Body Weight: 220 lb  % Usual  Body Weight: 67%  BMI:  25.2 - WNL  Estimated Nutritional Needs: Kcal: 1700-1900 Protein: 85-95 grams Fluid: 1.7-1.9 L  Skin: sacral decubitus ulcer, stage II x 7  Diet Order: Dysphagia 2; thin  EDUCATION NEEDS: -No education needs identified at this time   Intake/Output Summary (Last 24 hours) at 06/20/13 1118 Last data filed at  06/20/13 0800  Gross per 24 hour  Intake    360 ml  Output      9 ml  Net    351 ml    Last BM: 1/29  Labs:   Recent Labs Lab 06/18/13 0500 06/19/13 0507 06/20/13 0925  NA 132* 134* 137  K 4.7 4.4 4.6  CL 105 107 107  CO2 20 20 20   BUN 21 21 17   CREATININE 0.85 0.83 0.79  CALCIUM 7.0* 6.9* 7.2*  GLUCOSE 97 119* 89    CBG (last 3)  No results found for this basename: GLUCAP,  in the last 72 hours  Scheduled Meds: . alteplase  2 mg Intracatheter Once  . antiseptic oral rinse  15 mL Mouth Rinse BID  . aspirin  81 mg Oral Daily  . atorvastatin  20 mg Oral Q supper  . buPROPion  150 mg Oral Q breakfast  . feeding supplement (ENSURE COMPLETE)  237 mL Oral BID BM  . ferrous sulfate  325 mg Oral Q breakfast  . lidocaine  1 patch Transdermal Q24H  . multivitamin with minerals  1 tablet Oral Daily  . mycophenolate  1,000 mg Oral BID  . pantoprazole  40 mg Oral Daily  . potassium chloride  20 mEq Oral BID  . saccharomyces boulardii  500 mg Oral BID    Continuous Infusions:   Past Medical History  Diagnosis Date  . Anemia   . Hyperlipemia   . Hypertension   . Thyroid disease     hypothyroidism  . Anxiety   . Vitamin B 12 deficiency   . TIA (transient ischemic attack)     4-09  . Microscopic hematuria     benign microscopic hematuria, worked up b Dr Katrine Coho  . Gall stone     porcelain gall bladder with single stone  per CT 02-2009 Dr Newton Pigg  . Osteoporosis     DEXA on 04-30-10  . Myasthenia gravis     sees Dr. Krista Blue   . Ulcerative colitis   . DVT of leg (deep venous thrombosis) 12/2012    left leg; coumadin d/c b/c of LGI bleed; s/p IVC filter  . CMV colitis   . Cancer     uterine/ovarian  . Chronic systolic CHF (congestive heart failure)   . Cardiomyopathy     a. in setting of incarcerated hernia, AF with RVR, ARF, NSTEMI => probable Tako-Tsubo CM given WM abnormality; Echocardiogram (03/23/13): Mod LVH, EF 30-35% with AK of the entire apical  myocardium, AS/AL/Inf HK, Gr 1 DD, aortic sclerosis, MAC, mild MR, atrial septal lipomatous hypertrophy, mod TR, PASP 37;   Echo (04/2013): EF 60-65% normal wall motion, Gr 1 DD, MAC, mild to mod LAE  . Paroxysmal atrial fibrillation     not on coumadin due to AF in setting of acute illness as well as prior GI bleed on coumadin for DVT  . NSTEMI (non-ST elevated myocardial infarction)     a. 03/2013 in setting of incarcerated ventral hernia, SBO, ARF => likely Type 2 NSTEMI    Past Surgical History  Procedure Laterality Date  . Cardiac  catheterization      04/1999  . Abdominal hysterectomy      with oophorectomy  . Colonoscopy  1999    normal   . Colonoscopy N/A 12/10/2012    Procedure: COLONOSCOPY;  Surgeon: Gatha Mayer, MD;  Location: [facility];  Service: Endoscopy;  Laterality: N/A;  . Flexible sigmoidoscopy N/A 01/26/2013    Procedure: FLEXIBLE SIGMOIDOSCOPY;  Surgeon: Gatha Mayer, MD;  Location: [facility];  Service: Endoscopy;  Laterality: N/A;  . Ventral hernia repair N/A 03/25/2013    Procedure: LAPAROSCOPIC VENTRAL HERNIA REPAIR ;  Surgeon: Adin Hector, MD;  Location: [facility];  Service: General;  Laterality: N/A;  . Cholecystectomy N/A 03/25/2013    Procedure: LAPAROSCOPIC CHOLECYSTECTOMY WITH INTRAOPERATIVE CHOLANGIOGRAM;  Surgeon: Adin Hector, MD;  Location: [facility];  Service: General;  Laterality: N/A;  . Insertion of mesh N/A 03/25/2013    Procedure: INSERTION OF MESH;  Surgeon: Adin Hector, MD;  Location: [facility];  Service: General;  Laterality: N/A;  . Laparoscopic lysis of adhesions N/A 03/25/2013    Procedure: LAPAROSCOPIC LYSIS OF ADHESIONS;  Surgeon: Adin Hector, MD;  Location: [facility];  Service: General;  Laterality: N/A;  . Omentectomy N/A 03/25/2013    Procedure: PARTIAL OMENTECTOMY;  Surgeon: Adin Hector, MD;  Location: [facility];  Service: General;  Laterality: N/A;    Inda Coke MS, RD, LDN Pager: [phone] After-hours pager:  [phone]

## 2013-06-20 NOTE — Evaluation (Signed)
Occupational Therapy Assessment and Plan  Patient Details  Name: [name] MRN: [id number] Date of Birth: 1924-12-05  OT Diagnosis: altered mental status and muscle weakness (generalized) Rehab Potential: Rehab Potential: Good ELOS: 14-18 days   Today's Date: 06/20/2013 Time: 6195-0932 Time Calculation (min): 64 min  Problem List:  Patient Active Problem List   Diagnosis Date Noted  . GBS (Guillain-Barre syndrome) 06/19/2013  . Weakness 06/11/2013  . Proximal muscle weakness 06/11/2013  . Numbness of legs 06/11/2013  . Cardiomyopathy 04/22/2013  . Physical deconditioning 04/02/2013  . Pedal edema 04/01/2013  . Chronic systolic heart failure 67/04/4579  . Pulmonary hypertension 03/27/2013  . Hypokalemia 03/27/2013  . SBO (small bowel obstruction) 03/25/2013  . Ulcerative colitis   . Protein-calorie malnutrition, severe 03/24/2013  . Anemia of chronic disease 03/23/2013  . Acute respiratory failure with hypoxia 03/23/2013  . Atrial fibrillation with RVR 03/22/2013  . N&V (nausea and vomiting) 03/22/2013  . 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 Incisional hernia, incarcerated s/p lap repair 03/25/2013 03/22/2013  . NSTEMI (non-ST elevated myocardial infarction) 03/22/2013  . Acute renal failure 12/07/2012  . ANXIETY 12/26/2007  . HYPOTHYROIDISM 11/10/2006    Past Medical History:  Past Medical History  Diagnosis Date  . Anemia   . Hyperlipemia   . Hypertension   . Thyroid disease     hypothyroidism  . Anxiety   . Vitamin B 12 deficiency   . TIA (transient ischemic attack)     4-09  . Microscopic hematuria     benign microscopic hematuria, worked up b Dr Katrine Coho  . Gall stone     porcelain gall bladder with single stone  per CT 02-2009 Dr Newton Pigg  . Osteoporosis     DEXA on 04-30-10  . Myasthenia gravis     sees Dr. Krista Blue   . Ulcerative colitis   . DVT of leg (deep venous thrombosis) 12/2012    left leg; coumadin d/c b/c of  LGI bleed; s/p IVC filter  . CMV colitis   . Cancer     uterine/ovarian  . Chronic systolic CHF (congestive heart failure)   . Cardiomyopathy     a. in setting of incarcerated hernia, AF with RVR, ARF, NSTEMI => probable Tako-Tsubo CM given WM abnormality; Echocardiogram (03/23/13): Mod LVH, EF 30-35% with AK of the entire apical myocardium, AS/AL/Inf HK, Gr 1 DD, aortic sclerosis, MAC, mild MR, atrial septal lipomatous hypertrophy, mod TR, PASP 37;   Echo (04/2013): EF 60-65% normal wall motion, Gr 1 DD, MAC, mild to mod LAE  . Paroxysmal atrial fibrillation     not on coumadin due to AF in setting of acute illness as well as prior GI bleed on coumadin for DVT  . NSTEMI (non-ST elevated myocardial infarction)     a. 03/2013 in setting of incarcerated ventral hernia, SBO, ARF => likely Type 2 NSTEMI   Past Surgical History:  Past Surgical History  Procedure Laterality Date  . Cardiac catheterization      04/1999  . Abdominal hysterectomy      with oophorectomy  . Colonoscopy  1999    normal   . Colonoscopy N/A 12/10/2012    Procedure: COLONOSCOPY;  Surgeon: Gatha Mayer, MD;  Location: [facility];  Service: Endoscopy;  Laterality: N/A;  . Flexible sigmoidoscopy N/A 01/26/2013    Procedure: FLEXIBLE SIGMOIDOSCOPY;  Surgeon: Gatha Mayer, MD;  Location: [facility];  Service: Endoscopy;  Laterality: N/A;  . Ventral hernia repair  N/A 03/25/2013    Procedure: LAPAROSCOPIC VENTRAL HERNIA REPAIR ;  Surgeon: Adin Hector, MD;  Location: [facility];  Service: General;  Laterality: N/A;  . Cholecystectomy N/A 03/25/2013    Procedure: LAPAROSCOPIC CHOLECYSTECTOMY WITH INTRAOPERATIVE CHOLANGIOGRAM;  Surgeon: Adin Hector, MD;  Location: [facility];  Service: General;  Laterality: N/A;  . Insertion of mesh N/A 03/25/2013    Procedure: INSERTION OF MESH;  Surgeon: Adin Hector, MD;  Location: [facility];  Service: General;  Laterality: N/A;  . Laparoscopic lysis of adhesions N/A 03/25/2013     Procedure: LAPAROSCOPIC LYSIS OF ADHESIONS;  Surgeon: Adin Hector, MD;  Location: [facility];  Service: General;  Laterality: N/A;  . Omentectomy N/A 03/25/2013    Procedure: PARTIAL OMENTECTOMY;  Surgeon: Adin Hector, MD;  Location: [facility];  Service: General;  Laterality: N/A;    Assessment & Plan Clinical Impression: Patient is an 80 y.o. right-handed female with history of myasthenia gravis on CellCept, uterine cancer, DVT with IVC filter, GI bleed. Patient well known to rehabilitation services from admission November 2014 for deconditioning related to incarcerated hernia. Patient has been living with her daughter and son-in-law completing her ADLs minimal assistance using a walker for ambulation. Admitted 06/11/2013 with progressive weakness over the last 2 days involving the lower extremities with numbness. MRI of the brain negative for any acute changes. MRI cervical and thoracic spine is unremarkable except for some degenerative disc. MRI lumbar spine with no neural impingement. Neurology consulted possibilities included myasthenia gravis causing lower extremity fatigability versus possible GBS and placed on IVIG x5 days completed 06/18/2013. Venous Doppler studies bilateral lower extremity showed DVT common femoral, profunda, femoral, popliteal, posterior tibial and perineal veins. Patient does have an IVC filter in place. Oncology services consulted for thrombocytopenia question HIT which was ruled out and platelets continue to trend upward and monitored. Patient's latest platelet count 84,000 hemoglobin 8.6. Patient with sacral wound and air mattress overlay in place with wound care of allevyn to promote healing change every 3 days as needed. Physical and occupational therapy evaluations completed an ongoing with recommendations of physical medicine rehabilitation consult to consider inpatient rehabilitation services. Patient was admitted for comprehensive rehabilitation program   Patient  transferred to CIR on 06/19/2013 .    Patient currently requires max with basic self-care skills secondary to muscle weakness and decreased attention, decreased awareness, decreased problem solving, decreased safety awareness and decreased memory.  Prior to hospitalization, patient could complete BADL with min.  Patient will benefit from skilled intervention to increase independence with basic self-care skills prior to discharge home with care partner.  Anticipate patient will require minimal physical assistance and follow up home health.  OT - End of Session Activity Tolerance: Tolerates 10 - 20 min activity with multiple rests Endurance Deficit: Yes OT Assessment Rehab Potential: Good OT Patient demonstrates impairments in the following area(s): Cognition;Safety;Endurance;Perception;Behavior;Balance OT Basic ADL's Functional Problem(s): Eating;Grooming;Bathing;Dressing;Toileting OT Transfers Functional Problem(s): Toilet;Tub/Shower OT Plan OT Intensity: Minimum of 1-2 x/day, 45 to 90 minutes OT Frequency: 5 out of 7 days OT Duration/Estimated Length of Stay: 14-18 days OT Treatment/Interventions: Balance/vestibular training;Cognitive remediation/compensation;Discharge planning;Patient/family education;Therapeutic Exercise;Therapeutic Activities;Self Care/advanced ADL retraining;Functional mobility training OT Self Feeding Anticipated Outcome(s): Independent OT Basic Self-Care Anticipated Outcome(s): Min A OT Toileting Anticipated Outcome(s): Mod I OT Bathroom Transfers Anticipated Outcome(s): Supervision OT Recommendation Patient destination: Home Follow Up Recommendations: Home health OT Equipment Recommended: To be determined   Skilled Therapeutic Intervention 1:1 OT initial evaluation completed with  intervention provided to focus on improved activity tolerance, improved alertness and attention, and dynamic sitting and standing balance.   Patient received supine in bed but receptive  for transfer to w/c and limited ADL (change of clothing only).  Patient required max assist to rise from supine to sitting at edge of bed, max assist to transfer to w/c but performed seated dressing at sink side with min assist for upper body and max assist for lower body, standing supported to allow OT to pull up underwear and pants.   Patient was intermittently confused and restless during activity, reporting that her son-in-law and daughter were waiting to take her home.  OT assist patient with phone call to her daughter to reduce her anxiety near end of session.  Patient escorted to RN station, safety belt in place with supervision from RN staff and reading material provided as diversional activity.  OT Evaluation Precautions/Restrictions  Precautions Precautions: Fall Restrictions Weight Bearing Restrictions: No  General Chart Reviewed: Yes Family/Caregiver Present: No  Vital Signs Therapy Vitals BP: 136/79 mmHg Patient Position, if appropriate: Lying (after sitting, unable to get read sitting) Oxygen Therapy SpO2:  (unable to get accurate read )  Pain Pain Assessment Pain Assessment: No/denies pain Faces Pain Scale: Hurts little more Pain Type: Acute pain Pain Location: Leg Pain Orientation: Left Pain Descriptors / Indicators: Aching Pain Onset: With Activity Pain Intervention(s): Repositioned;Rest  Home Living/Prior Functioning Home Living Available Help at Discharge: Family Type of Home: House Home Access: Level entry Home Layout: Two level;Able to live on main level with bedroom/bathroom Additional Comments: has a built in shower seat and tub shower  Lives With: Daughter IADL History Homemaking Responsibilities: No Current License: No Occupation: Retired Prior Function Level of Independence: Needs assistance with ADLs Bath: Minimal Dressing: Minimal Driving: No Vocation: Retired Leisure: Hobbies-no Comments: ambulates with rollator   ADL ADL ADL  Comments: see FIM  Vision/Perception  Vision - History Baseline Vision: Wears glasses only for reading Patient Visual Report: No change from baseline Vision - Assessment Eye Alignment: Within Functional Limits Perception Perception: Within Functional Limits Praxis Praxis: Intact   Cognition Overall Cognitive Status: Impaired/Different from baseline Arousal/Alertness: Suspect due to medications Orientation Level: Oriented to person;Disoriented to situation;Disoriented to time;Oriented to place Attention: Focused Focused Attention: Appears intact Sustained Attention: Impaired Sustained Attention Impairment: Verbal complex;Functional complex Memory: Impaired Memory Impairment: Decreased short term memory;Decreased recall of new information Decreased Short Term Memory: Functional basic;Verbal complex Awareness: Impaired Awareness Impairment: Intellectual impairment Problem Solving: Impaired Problem Solving Impairment: Functional basic Executive Function: Reasoning;Organizing;Decision Making;Self Correcting Reasoning: Impaired Reasoning Impairment: Verbal complex Organizing: Impaired Organizing Impairment: Functional basic;Verbal complex Decision Making: Impaired Decision Making Impairment: Verbal complex;Functional basic Self Correcting: Impaired Self Correcting Impairment: Functional basic Behaviors: Restless Safety/Judgment: Impaired Comments: Pt reports hearing daughter in her room asking questions, thinks daughter then son-in-law in room. Does not answer questions appropriately. Difficulty following one step commands.   Sensation Sensation Light Touch: Impaired Detail Light Touch Impaired Details: Impaired LLE;Impaired RLE Stereognosis: Appears Intact Hot/Cold: Appears Intact Proprioception: Appears Intact Proprioception Impaired Details: Impaired RUE;Impaired LUE;Impaired RLE;Impaired LLE Coordination Gross Motor Movements are Fluid and Coordinated: Yes Fine Motor  Movements are Fluid and Coordinated: Yes Coordination and Movement Description: Able to comb hair, don/doff glasses, don shirt, self-feed pudding, drink, and flip pages while reading magazine during later treatment session  Motor  Motor Motor: Within Functional Limits  Mobility  Bed Mobility Bed Mobility: Rolling Right;Supine to Sit;Sitting - Scoot to [place] of Bed  Rolling Right: 3: Mod assist Rolling Right Details: Manual facilitation for placement Rolling Right Details (indicate cue type and reason): Mod/max verbal cues for sequencing, required verbal and tactile cues to release railing to roll side/side Rolling Left: 3: Mod assist Supine to Sit: 2: Max assist Supine to Sit Details: Manual facilitation for placement Supine to Sit Details (indicate cue type and reason): Requires assist to upright trunk and assist LEs. Pt immediately lightheaded.  Sitting - Scoot to Edge of Bed: 3: Mod assist Sitting - Scoot to [employer] of Bed Details: Manual facilitation for weight shifting Sit to Supine: HOB flat;2: Max assist Scooting to [facility]: 3: Mod assist Scooting to [facility] Details (indicate cue type and reason): THrough bridging of LEs and pulling on rails. significant time to complete tasks.  Transfers Transfers: Sit to Stand;Stand to Sit Sit to Stand: 2: Max assist Sit to Stand Details: Manual facilitation for weight bearing;Verbal cues for precautions/safety Stand to Sit: 3: Mod assist Stand to Sit Details (indicate cue type and reason): Manual facilitation for placement   Trunk/Postural Assessment  Cervical Assessment Cervical Assessment: Within Functional Limits Thoracic Assessment Thoracic Assessment: Within Functional Limits Lumbar Assessment Lumbar Assessment: Within Functional Limits Postural Control Postural Control: Deficits on evaluation   Balance Dynamic Sitting Balance Sitting balance - Comments: Pt sat EOB x 10 min, balance progressive got worse, posterior lean and lateral losses  of balance. mod assist for balance  Extremity/Trunk Assessment RUE Assessment RUE Assessment: Within Functional Limits LUE Assessment LUE Assessment: Within Functional Limits  FIM:  FIM - Grooming Grooming Steps: Brush, comb hair Grooming: 2: Patient completes 1 of 4 or 2 of 5 steps FIM - Bathing Bathing: 0: Activity did not occur FIM - Upper Body Dressing/Undressing Upper body dressing/undressing steps patient completed: Thread/unthread right sleeve of pullover shirt/dresss;Thread/unthread left sleeve of pullover shirt/dress;Put head through opening of pull over shirt/dress Upper body dressing/undressing: 4: Min-Patient completed 75 plus % of tasks FIM - Lower Body Dressing/Undressing Lower body dressing/undressing: 1: Total-Patient completed less than 25% of tasks FIM - Control and instrumentation engineer Devices: Arm rests;Bed rails Bed/Chair Transfer: 2: Supine > Sit: Max A (lifting assist/Pt. 25-49%);2: Bed > Chair or W/C: Max A (lift and lower assist)   Refer to Care Plan for Long Term Goals  Recommendations for other services: None  Discharge Criteria: Patient will be discharged from OT if patient refuses treatment 3 consecutive times without medical reason, if treatment goals not met, if there is a change in medical status, if patient makes no progress towards goals or if patient is discharged from hospital.  The above assessment, treatment plan, treatment alternatives and goals were discussed and mutually agreed upon: by patient  Second session: Time: 1446-1507 Time Calculation (min): 21 min  Pain Assessment: No pain  Skilled Therapeutic Interventions: ADL-retraining with focus on improved bed mobility and functional transfer in/out of bed.  Patient received asleep in bed, aroused by repeated stimulation and prompts to planned activity.   Patient completed bed mobility, rolling to left but required max assist to place hands on bedrail and to lift legs out of  bed.   Patient able to sustain static sitting for 2 minutes with steadying assist in prep for mechanical lift transfer to toilet using Steady.  Patient able to pull up to Steady using both legs and arm with mod assist but could not sustain static standing sufficiently to allow access to both seat extension.   Patient immediately sat down on 1 of 2 seat  extensions and required max A +2, to recover to supine in bed.   Patient refused further treatment due to fatigue.    Call light placed within reach.  See FIM for current functional status  Therapy/Group: Individual Therapy  [place] 06/20/2013, 3:28 PM

## 2013-06-20 NOTE — Plan of Care (Signed)
Problem: RH BOWEL ELIMINATION Goal: RH STG MANAGE BOWEL WITH ASSISTANCE STG Manage Bowel with Mod I Assistance.  Outcome: Progressing No incontinent episode today

## 2013-06-20 NOTE — Consult Note (Addendum)
WOC wound consult note Reason for Consult: evaluation of sacral wounds, now transferred to inpatient rehab.  My partner evaluated same wounds 06/13/13 during acute care hospitalization, please see her notes as well.  These areas are all Stage II Pressure Ulcers scattered over the sacral area.   Wound type: Stage II Pressure Ulcers  Pressure Ulcer POA: Yes 7 areas over the sacrum Measurement: scattered lesions largest on the left aprox. 4.0cm x 3.0cm x 0.1cm and several areas on the right 3.0cm x 2.0cm x 0.1cm  Wound bed: partial thickness skin loss associated with pressure, potentially aggravated by frequent loose stools. The sites do have epithelial bud throughout the wound bed. Drainage (amount, consistency, odor) yellow, but not concerned for infection.  Periwound:intact Dressing procedure/placement/frequency: silicone foam to protect, insulate and absorb excess exudate. Change every 3 days and PRN soilage. Air mattress in place for moisture management and offloading. Asked bedside nurse to have rehab assess for appropriate wheel chair cushion.  Will order chair pressure redistribution pad for use when up in chair.   Discussed POC with patient and bedside nurse.  Re consult if needed, will not follow at this time. Thanks  Estelita Iten [place], [place] [phone])

## 2013-06-20 NOTE — Progress Notes (Addendum)
Physical Therapy Session Note  Patient Details  Name: [name] MRN: [id number] Date of Birth: 1924-12-18  Today's Date: 06/20/2013 Time:1300-1345  Time Calculation (min): 45 min  Short Term Goals: Week 1:  PT Short Term Goal 1 (Week 1): Pt will transfer bed<> wheelchair with mod assist PT Short Term Goal 2 (Week 1): Pt will sit <> stand with mod assist PT Short Term Goal 3 (Week 1): Pt will propel w/c x 25' with mod assist PT Short Term Goal 4 (Week 1): Pt will verbalize understanding of mechanism for pressure relief and frequency for improved skin integrity   Skilled Therapeutic Interventions/Progress Updates:  Pt improved alertness initially however declined as session progressed, reports of increased lightheadedness and general malaise. BP 107/74 HR 88 bpm. Pt hallucinating about people in her room - reports she saw a man then looked again and didn't see anyone. Pt reports increased double vision (reports longstanding issue but worse today). Pt keeps eyes closed most of session and all activities (standing, transfers, etc). Pt answers questions appropriately ~40% of time however then goes into tangential conservation that is hard to follow.    Wheelchair propulsion x 30' with mod assist, pt keeps eyes closed. Ambulation x 4' with RW and +2 total assist significant posterior lean. Request for bathroom, stand pivot transfer with RW max assist, posterior lean and difficulty managing RW. Sit <> stands from [facility] max assist facilitating anterior weight shift multiple stands needed to don brief and pull up pants. Pt needed +2 to safely transfer from [facility] to w/c and then w/c>bed.   Therapy Documentation Precautions:  Precautions Precautions: Fall Restrictions Weight Bearing Restrictions: No Pain: Pain Assessment Pain Assessment: No/denies pain  See FIM for current functional status  Therapy/Group: Individual Therapy  Lahoma Rocker 06/20/2013, 3:32 PM

## 2013-06-21 ENCOUNTER — Inpatient Hospital Stay (HOSPITAL_COMMUNITY): Payer: Medicare Other | Admitting: Physical Therapy

## 2013-06-21 ENCOUNTER — Inpatient Hospital Stay (HOSPITAL_COMMUNITY): Payer: Medicare Other

## 2013-06-21 LAB — URINALYSIS, ROUTINE W REFLEX MICROSCOPIC
BILIRUBIN URINE: NEGATIVE
Glucose, UA: NEGATIVE mg/dL
Hgb urine dipstick: NEGATIVE
KETONES UR: NEGATIVE mg/dL
Leukocytes, UA: NEGATIVE
NITRITE: NEGATIVE
Protein, ur: NEGATIVE mg/dL
Specific Gravity, Urine: 1.023 (ref 1.005–1.030)
UROBILINOGEN UA: 0.2 mg/dL (ref 0.0–1.0)
pH: 5 (ref 5.0–8.0)

## 2013-06-21 NOTE — Progress Notes (Signed)
Physical Therapy Session Note  Patient Details  Name: [name] MRN: [id number] Date of Birth: 05-06-1925  Today's Date: 06/21/2013 Time:  0730- 0828  Total Time: 58 min  Short Term Goals: Week 1:  PT Short Term Goal 1 (Week 1): Pt will transfer bed<> wheelchair with mod assist PT Short Term Goal 2 (Week 1): Pt will sit <> stand with mod assist PT Short Term Goal 3 (Week 1): Pt will propel w/c x 25' with mod assist PT Short Term Goal 4 (Week 1): Pt will verbalize understanding of mechanism for pressure relief and frequency for improved skin integrity   Skilled Therapeutic Interventions/Progress Updates:    Pain: Lt. Hip not rated, declined medication - pt repositioned Pt admits some confusion this morning however pt much improved as compared to yesterday. Still a little disoriented but pt aware of this, able to follow one-step commands 90% of time today. Supine bil LE warm up: heel cord stretch PROM, hip/knee flexion/extension AAROM, small hip circles AAROM x 10 reps each. Supine>sit mod assist, requires assist for bil LEs and uprighting of trunk. Sit <> stands initially max assist due to posterior lean however progressed to min assist with repetition (10x) while holding onto sink and PT facilitating anterior weight shift. Pt able to propel w/c around room with min assist and mod verbal cues. Practiced lateral leans for pressure relief in w/c. Nursing made aware of times to come into room and stand pt for pressure relief as pt has difficulty adequately lifting buttocks.   Second Session Time:  1135-1200 Time Calculation (min): 25 min Skilled Therapeutic Interventions/Progress Updates:  Pain: grimaces from Lt hip pain but declines medication, rest/repositioned  Pt still with some disorientation however continues to improve. Supine>sit with mod assist, less assist needed for LEs. Seated anterior weight shifting/anterior pelvic tilt + reaching task progressing to 2 x 5 rep sit <> stands to  RW. Stand pivot transfer to RW, posterior lean increased as she continued transfer - max assist and PT to negotiate RW. Wheelchair pushups x 10 reps, now able to unweight buttocks but unable to fully clear- needs continued practice. RN aware of returning pt to bed after lunch to keep pressure off buttocks.   Therapy Documentation Precautions:  Precautions Precautions: Fall Restrictions Weight Bearing Restrictions: No  See FIM for current functional status  Therapy/Group: Individual Therapy both sessions  Lahoma Rocker 06/21/2013, 7:15 AM

## 2013-06-21 NOTE — Progress Notes (Signed)
Subjective/Complaints: Just waking up. Slept "fairly" well. Urine with odor A 12 point review of systems has been performed and if not noted above is otherwise negative.   Objective: Vital Signs: Blood pressure 105/60, pulse 74, temperature 98 F (36.7 C), temperature source Oral, resp. rate 18, height 5\' 4"  (1.626 m), SpO2 96.00%. No results found.  Recent Labs  06/19/13 0507 06/20/13 0820  WBC 2.3* 3.0*  HGB 8.6* 10.2*  HCT 25.9* 29.7*  PLT 84* 81*    Recent Labs  06/19/13 0507 06/20/13 0925  NA 134* 137  K 4.4 4.6  CL 107 107  GLUCOSE 119* 89  BUN 21 17  CREATININE 0.83 0.79  CALCIUM 6.9* 7.2*   CBG (last 3)  No results found for this basename: GLUCAP,  in the last 72 hours  Wt Readings from Last 3 Encounters:  06/17/13 66.9 kg (147 lb 7.8 oz)  05/31/13 70.761 kg (156 lb)  05/07/13 74.39 kg (164 lb)    Physical Exam:  Constitutional: She is oriented to person, place. Urine malodorous  Frail 80 year old female.  HENT:  Head: Normocephalic.  Eyes: EOM are normal.  Neck: Normal range of motion. Neck supple. No thyromegaly present.  Cardiovascular: Normal rate and regular rhythm. No murmurs. No gallops  Respiratory: Effort normal and breath sounds normal. No respiratory distress. No wheezes, rales  GI: Soft. Bowel sounds are normal. She exhibits no distension.  Neurological: She is alert and oriented to place. Slow to arouse this am.follows all simple commands  Follows simple commands. UE 4-prox with deltoid, bicep to 4 distally. LE's 1 with LEFt HF (pain component), RIGHT HF 3. KE 2- left, 2+ right, ankles 3/5. Decreased LT at both feet to about the malleoli, no ptosis, no dsyarthria, CN exam intact  Skin: persistent gluteal/perineal/waist line with a few patches of healing round lesions. Appear to be resolving herpetic lesions. Area generally clean, granulating, no unopened vesicles. Psychiatric: She has a normal mood and affect. Her behavior is normal.  Judgment and thought content normal      Assessment/Plan: 1. Functional deficits secondary to Guillain Barre Syndrome which require 3+ hours per day of interdisciplinary therapy in a comprehensive inpatient rehab setting. Physiatrist is providing close team supervision and 24 hour management of active medical problems listed below. Physiatrist and rehab team continue to assess barriers to discharge/monitor patient progress toward functional and medical goals. FIM: FIM - Bathing Bathing: 0: Activity did not occur  FIM - Upper Body Dressing/Undressing Upper body dressing/undressing steps patient completed: Thread/unthread right sleeve of pullover shirt/dresss;Thread/unthread left sleeve of pullover shirt/dress;Put head through opening of pull over shirt/dress Upper body dressing/undressing: 4: Min-Patient completed 75 plus % of tasks FIM - Lower Body Dressing/Undressing Lower body dressing/undressing: 1: Total-Patient completed less than 25% of tasks  FIM - Toileting Toileting steps completed by patient: Adjust clothing prior to toileting;Performs perineal hygiene;Adjust clothing after toileting Toileting Assistive Devices: Grab bar or rail for support Toileting: 1: Two helpers  FIM - Radio producer Devices: Elevated toilet seat Toilet Transfers: 1-Two helpers  FIM - Control and instrumentation engineer Devices: Arm rests;Bed rails Bed/Chair Transfer: 2: Sit > Supine: Max A (lifting assist/Pt. 25-49%);1: Two helpers  FIM - Locomotion: Wheelchair Locomotion: Wheelchair: 1: Travels less than 50 ft with moderate assistance (Pt: 50 - 74%) FIM - Locomotion: Ambulation Locomotion: Ambulation Assistive Devices: Administrator Ambulation/Gait Assistance: 1: +2 Total assist Locomotion: Ambulation: 1: Two helpers  Comprehension Comprehension Mode: Auditory Comprehension:  4-Understands basic 75 - 89% of the time/requires cueing 10 - 24% of the  time  Expression Expression Mode: Verbal Expression: 3-Expresses basic 50 - 74% of the time/requires cueing 25 - 50% of the time. Needs to repeat parts of sentences.  Social Interaction Social Interaction: 3-Interacts appropriately 50 - 74% of the time - May be physically or verbally inappropriate.  Problem Solving Problem Solving: 3-Solves basic 50 - 74% of the time/requires cueing 25 - 49% of the time  Memory Memory: 2-Recognizes or recalls 25 - 49% of the time/requires cueing 51 - 75% of the time Medical Problem List and Plan:  1. Suspect GBS. (? Myasthenia gravis). IVIG completed. Continue CellCept  2. DVT Prophylaxis/Anticoagulation: History of DVT with IVC filter  3. Pain Management: Hydrocodone as needed. Monitor with increased mobility  -added lidoderm patch for left low back/hip pain---suspect pain is from lumbar spondylosis/referral  3. Mood/anxiety/depression: Wellbutrin 150 mg daily, Xanax 0.25 mg twice daily as needed and 1 mg each bedtime as needed  4. Neuropsych: This patient is capable of making decisions on her own behalf.  5. Hypothyroidism. Synthroid  6. Hyperlipidemia. Lipitor  7. Chronic anemia/thrombocytopenia. Latest hemoglobin 8.6 and platelet count 84,000 improved from 67,000. Continue iron supplement followup CBC  8. Sacral wounds. Appreciate WOC input and recs -these are likely herpetic lesions/ HSV  -by their appearance it's really too late to treat.  -air mattress  9. Chronic loose stool: probiotic/diet control   LOS (Days) 2 A FACE TO FACE EVALUATION WAS PERFORMED  SWARTZ,ZACHARY T 06/21/2013 7:52 AM

## 2013-06-21 NOTE — IPOC Note (Signed)
Overall Plan of Care [facility]) Patient Details Name: [name] MRN: [id number] DOB: [birth year]  Admitting Diagnosis: MG  Hospital Problems: Principal Problem:   GBS (Guillain-Barre syndrome)     Functional Problem List: Nursing Bladder;Bowel;Endurance;Medication Management;Nutrition;Pain;Safety;Skin Integrity;Sensory  PT Balance;Endurance;Pain;Sensory;Safety  OT Cognition;Safety;Endurance;Perception;Behavior;Balance  SLP    TR         Basic ADL's: OT Eating;Grooming;Bathing;Dressing;Toileting     Advanced  ADL's: OT       Transfers: PT Bed Mobility;Bed to Chair;Car;Furniture  OT Toilet;Tub/Shower     Locomotion: PT Ambulation;Wheelchair Mobility     Additional Impairments: OT    SLP        TR      Anticipated Outcomes Item Anticipated Outcome  Self Feeding Independent  Swallowing      Basic self-care  Min A  Toileting  Mod I   Bathroom Transfers Supervision  Bowel/Bladder  continent of bowel and bladder with modified independence   Transfers  Min assist  Locomotion  Mod assist short distances  Communication     Cognition     Pain  3 or less on sclae of 1-10  Safety/Judgment  supervision   Therapy Plan: PT Intensity: Minimum of 1-2 x/day ,45 to 90 minutes PT Frequency: 5 out of 7 days PT Duration Estimated Length of Stay: 14-19 days OT Intensity: Minimum of 1-2 x/day, 45 to 90 minutes OT Frequency: 5 out of 7 days OT Duration/Estimated Length of Stay: 14-18 days         Team Interventions: Nursing Interventions Patient/Family Education;Bladder Management;Bowel Management;Disease Management/Prevention;Pain Management;Medication Management;Skin Care/Wound Management;Discharge Planning;Psychosocial Support  PT interventions Ambulation/gait training;Balance/vestibular training;Cognitive remediation/compensation;Disease management/prevention;Discharge planning;DME/adaptive equipment instruction;Neuromuscular re-education;Pain  management;Functional mobility training;Patient/family education;Therapeutic Exercise;Splinting/orthotics;Skin care/wound management;Therapeutic Activities;UE/LE Coordination activities;UE/LE Strength taining/ROM;Psychosocial support;Wheelchair propulsion/positioning  OT Interventions Balance/vestibular training;Cognitive remediation/compensation;Discharge planning;Patient/family education;Therapeutic Exercise;Therapeutic Activities;Self Care/advanced ADL retraining;Functional mobility training  SLP Interventions    TR Interventions    SW/CM Interventions      Team Discharge Planning: Destination: PT-Home ,OT- Home , SLP-  Projected Follow-up: PT-Home health PT;24 hour supervision/assistance, OT-  Home health OT, SLP-  Projected Equipment Needs: PT-To be determined, OT- To be determined, SLP-  Equipment Details: PT- , OT-  Patient/family involved in discharge planning: PT- Patient,  OT-Patient, SLP-   MD ELOS: 17 days Medical Rehab Prognosis:  Excellent Assessment: The patient has been admitted for CIR therapies. The team will be addressing, functional mobility, strength, stamina, balance, safety, adaptive techniques/equipment, self-care, bowel and bladder mgt, patient and caregiver education, pain mgt, skin care, egosupport, . Goals have been set at min assist for basic self-care and min to mod assist for transfers and mobility. Good family support.    [name], MD, FAAPMR      See Team Conference Notes for weekly updates to the plan of care

## 2013-06-21 NOTE — Progress Notes (Signed)
Occupational Therapy Session Note  Patient Details  Name: [name] MRN: [id number] Date of Birth: 23-Nov-1924  Today's Date: 06/21/2013 Time:0830-0930 Time Calculation (min): 60 min  Short Term Goals: Week 1:  OT Short Term Goal 1 (Week 1): Patient will complete upper body bathing and dressing with min assist OT Short Term Goal 2 (Week 1): Patient will complete lower body bathing and dressing with max assist OT Short Term Goal 3 (Week 1): Patient will complete toilet transfer with mod assist OT Short Term Goal 4 (Week 1): Patient will demo ability to perform sit <> stand during ADL to pull-up underwear and/or pants with mod assist  Skilled Therapeutic Interventions/Progress Updates: ADL-retraining with focus on improved activity tolerance, transfers, static standing balance, and dynamic sitting balance.    Patient received supine in bed, alert and oriented to person, place, situation with partial recall of previous encounter and improved awareness of episodes of confusion.   Patient receptive to seated assisted bathing and dressing, deferring grooming.   From supine, patient required max assist to rise to sitting at edge bed, although with improved performance of lifting upper body (pt = 60%).   From edge of bed, patient completed stand-pivot transfer to w/c with manual facilitation to shift-weight and to place hand as well as verbal cues for sequencing.   Patient was escorted to sink side in her w/c and required frequent breaks throughout bathing and dressing but improved level of participation in task (pt = 40%, globally for B & D, see FIM).   Patient was able to wash upper legs and peri-area this session with mod assist to stand and manual contact at both hips while standing, supporting her self by alternate use of right and left arm on countertop.    Patient able to reach to either ankle but was unable to lace pants or socks this session.   Patient transferred back to bed with mod assist in  prep for RN care (catheterization to empty bladder).    Therapy Documentation Precautions:  Precautions Precautions: Fall Restrictions Weight Bearing Restrictions: No  Pain: Pain Assessment Pain Assessment: Faces Pain Score: 0-No pain Faces Pain Scale: Hurts even more Pain Type: Acute pain Pain Location: Buttocks Pain Orientation: Posterior Patients Stated Pain Goal: 3 Multiple Pain Sites: No  ADL: ADL ADL Comments: see FIM  See FIM for current functional status  Therapy/Group: Individual Therapy  Second session: Time: 1415-1500 Time Calculation (min):  45 min  Pain Assessment: 6/10, buttocks, repositioned  Skilled Therapeutic Interventions: Therapeutic activities with focus on improved activity tolerance, bed mobility, functional mobility using RW, transfers, and general conditioning using NuStep.   Patient alert and oriented, family present in her room.   With supervision assist patient completed bed mobility, rolling to her right and sitting up at edge of bed using only bed rail to assist.   Patient required mod assist to rise from sit>stand at [place] and ambulated 5' with min assist to advance left leg with mod assist to transfer to w/c.   From w/c patient was escorted to gym, completing similar transfer to NuStep and performed 7 min of continuous bil U/LE general strengthening with OT assist to apply left foot strap for stabilization and re-ed on use of device.   Patient completed therex and returned to w/c with similar transfer and escorted back to her room.   Safety belt applied, call light placed within reach, RN aware.  See FIM for current functional status  Therapy/Group: Individual Therapy  Salome Spotted 06/21/2013,  12:26 PM

## 2013-06-22 ENCOUNTER — Inpatient Hospital Stay (HOSPITAL_COMMUNITY): Payer: Medicare Other | Admitting: Physical Therapy

## 2013-06-22 ENCOUNTER — Inpatient Hospital Stay (HOSPITAL_COMMUNITY): Payer: Medicare Other | Admitting: *Deleted

## 2013-06-22 DIAGNOSIS — G610 Guillain-Barre syndrome: Secondary | ICD-10-CM

## 2013-06-22 LAB — URINE CULTURE
COLONY COUNT: NO GROWTH
Culture: NO GROWTH

## 2013-06-22 NOTE — Plan of Care (Signed)
Problem: RH BLADDER ELIMINATION Goal: RH STG MANAGE BLADDER WITH ASSISTANCE STG Manage Bladder With Mod I Assistance  Outcome: Progressing Pt continent so far this shift, no incontinence episodes , toileting every 3 hours, depends in use, up to BR/BSC with steady

## 2013-06-22 NOTE — Progress Notes (Signed)
Physical Therapy Session Note  Patient Details  Name: [name] MRN: [id number] Date of Birth: 03/02/1925  Today's Date: 06/22/2013 Time: 1300-1330 Time Calculation (min): 30 min  Short Term Goals: Week 1:  PT Short Term Goal 1 (Week 1): Pt will transfer bed<> wheelchair with mod assist PT Short Term Goal 2 (Week 1): Pt will sit <> stand with mod assist PT Short Term Goal 3 (Week 1): Pt will propel w/c x 25' with mod assist PT Short Term Goal 4 (Week 1): Pt will verbalize understanding of mechanism for pressure relief and frequency for improved skin integrity   Therapy Documentation Precautions:  Precautions Precautions: Fall Restrictions Weight Bearing Restrictions: No Pain: occasionally at L hip but not currently  Therapeutic Activity:(15') bed mobility rolling R side with HOB flat and using rail with min-assist, R side lying to sitting Mod-assist with assist of  LE's, Transfers sit->stand S/min-assist from elevated bed, sit->stand from chair with min-assist into RW, stand->sit S/Mod-I. Gait Training:(15') using RW ambulated 1 x 25' inside room to toilet with S/min-guard assist. Parallel bars 4 x 15' and side stepping R and L 4 x with supervision.  Therapy/Group: Individual Therapy  Evelyn Aguinaldo J 06/22/2013, 1:15 PM

## 2013-06-22 NOTE — Plan of Care (Signed)
Problem: RH BOWEL ELIMINATION Goal: RH STG MANAGE BOWEL WITH ASSISTANCE STG Manage Bowel with Mod I Assistance.  Outcome: Not Progressing Incontinent of stool x 1,patient unaware of incontinent, total assist for hygiene, total assist to change depends

## 2013-06-22 NOTE — Progress Notes (Signed)
Patient ID: Saleemah Mollenhauer, female   DOB: 10-10-1924, 80 y.o.   MRN: [id number] Mrytle Bento is a 80 y.o. female 11-19-1924 [id number]  Subjective: No new complaints. Ongoing fatigue, no pain. No new problems. Slept well. Feeling OK.  Objective: Vital signs in last 24 hours: Temp:  [97.6 F (36.4 C)-97.9 F (36.6 C)] 97.9 F (36.6 C) (01/31 0515) Pulse Rate:  [67-74] 67 (01/31 0515) Resp:  [17-19] 17 (01/31 0515) BP: (102-136)/(66-76) 102/66 mmHg (01/31 0515) SpO2:  [97 %-98 %] 97 % (01/31 0515) Weight change:  Last BM Date: 06/21/13  Intake/Output from previous day: 01/30 0701 - 01/31 0700 In: -  Out: 254 [Urine:252; Stool:2]  Physical Exam General: No apparent distress   Sitting in BS WC Lungs: Normal effort. Lungs clear to auscultation, no crackles or wheezes. Cardiovascular: Regular rate and rhythm, no edema Musculoskeletal:  Neurovascularly intact Neurological: No new neurological deficits  Lab Results: BMET    Component Value Date/Time   NA 137 06/20/2013 0925   K 4.6 06/20/2013 0925   CL 107 06/20/2013 0925   CO2 20 06/20/2013 0925   GLUCOSE 89 06/20/2013 0925   BUN 17 06/20/2013 0925   CREATININE 0.79 06/20/2013 0925   CALCIUM 7.2* 06/20/2013 0925   GFRNONAA 73* 06/20/2013 0925   GFRAA 84* 06/20/2013 0925   CBC    Component Value Date/Time   WBC 3.0* 06/20/2013 0820   RBC 3.31* 06/20/2013 0820   RBC 2.73* 12/08/2012 0828   HGB 10.2* 06/20/2013 0820   HGB 7.3* 01/24/2013 1440   HCT 29.7* 06/20/2013 0820   PLT 81* 06/20/2013 0820   MCV 89.7 06/20/2013 0820   MCH 30.8 06/20/2013 0820   MCHC 34.3 06/20/2013 0820   RDW 15.5 06/20/2013 0820   LYMPHSABS 0.3* 06/20/2013 0820   MONOABS 0.2 06/20/2013 0820   EOSABS 0.0 06/20/2013 0820   BASOSABS 0.0 06/20/2013 0820   CBG's (last 3):  No results found for this basename: GLUCAP,  in the last 72 hours LFT's Lab Results  Component Value Date   ALT 14 06/20/2013   AST 22 06/20/2013   ALKPHOS 75 06/20/2013   BILITOT 0.4  06/20/2013    Studies/Results: No results found.  Medications:  I have reviewed the patient's current medications. Scheduled Medications: . antiseptic oral rinse  15 mL Mouth Rinse BID  . aspirin  81 mg Oral Daily  . atorvastatin  20 mg Oral Q supper  . buPROPion  150 mg Oral Q breakfast  . feeding supplement (ENSURE COMPLETE)  237 mL Oral BID BM  . ferrous sulfate  325 mg Oral Q breakfast  . lidocaine  1 patch Transdermal Q24H  . multivitamin with minerals  1 tablet Oral Daily  . mycophenolate  1,000 mg Oral BID  . pantoprazole  40 mg Oral Daily  . potassium chloride  20 mEq Oral BID  . saccharomyces boulardii  500 mg Oral BID   PRN Medications: acetaminophen, acetaminophen, ALPRAZolam, ALPRAZolam, bisacodyl, HYDROcodone-acetaminophen, sodium chloride  Assessment/Plan: Principal Problem:   GBS (Guillain-Barre syndrome)  (? Myasthenia gravis). IVIG completed. Continue CellCept  2. DVT Prophylaxis/Anticoagulation: History of DVT with IVC filter  3. Pain Management: Hydrocodone as needed. Monitor with increased mobility  -added lidoderm patch for left low back/hip pain---suspect pain is from lumbar spondylosis/referral  3. Mood/anxiety/depression: Wellbutrin 150 mg daily, Xanax 0.25 mg twice daily as needed and 1 mg each bedtime as needed  4. Neuropsych: This patient is capable of making decisions on her own behalf.  5. Hypothyroidism. Synthroid  6. Hyperlipidemia. Lipitor  7. Chronic anemia/thrombocytopenia. Improved. Continue iron supplement followup CBC  8. Sacral wounds. Appreciate WOC input and recs  -these are likely herpetic lesions/ HSV  -air mattress  9. Chronic loose stool: probiotic/diet control   Length of stay, days: 3    Sammie Schermerhorn A. Asa Lente, MD 06/22/2013, 8:04 AM

## 2013-06-22 NOTE — Progress Notes (Signed)
Physical Therapy Session Note  Patient Details  Name: [name] MRN: [id number] Date of Birth: 1924-09-16  Today's Date: 06/22/2013 Time: 0730-0830 Time Calculation (min): 60 min  Short Term Goals: Week 1:  PT Short Term Goal 1 (Week 1): Pt will transfer bed<> wheelchair with mod assist PT Short Term Goal 2 (Week 1): Pt will sit <> stand with mod assist PT Short Term Goal 3 (Week 1): Pt will propel w/c x 25' with mod assist PT Short Term Goal 4 (Week 1): Pt will verbalize understanding of mechanism for pressure relief and frequency for improved skin integrity   Therapy Documentation Precautions:  Precautions Precautions: Fall Restrictions Weight Bearing Restrictions: No Pain: denies pain    Therapeutic Exercise:(30')  Supine B LE's Active including bridging, and manually resisted leg presses Therapeutic Activity:(30') Bed Mobility (with air mattress deflated and HOB flat) patient using side bed rails with min-assist to roll to R and to L, Scooting to [facility] is Max-assist, R sidelying to sitting EOB with Mod-assist to bring LE's and lower while sitting, scooting to EOB with min-assist, Transfer Training; bed -> w/c via stand-pivot transfer with min/Mod-assist. Sit<-->Stand into RW multiple times for exercise and for reinforcement into body mechanics with emphasis on forward trunk lean to initiate transfer as well as during late descent back into sitting.    Therapy/Group: Individual Therapy  Marlin Jarrard J 06/22/2013, 8:00 AM

## 2013-06-22 NOTE — Progress Notes (Addendum)
Occupational Therapy Note Patient Details  Name: [name] MRN: [id number] Date of Birth: Oct 12, 1924 Today's Date: 06/22/2013  Time: 0930-1030  (60 min)  1st session Pain:  none Individual session  1st session:  Skilled OT intervention for wc mobility, sit to stand, toilet transfers, standing balance during functional activity.  Pt. Sitting in wc upon OT arrival.  Pt. Propelled wc to sink.  Performed UB bathing and dressing at set up level.  Pushed wc to 3n1 over toilet and pt transferred from wc to toilet with mod assist using grab bar.  Pt urinated and had BM.  Stood with minimal assist  to clean periarea and total assit with pericare and pants.  Transferred back to wc and and then to bed with mod assist.  Remained in bed with bed alarm on and call bell,phone within reach.  Time: 1330- 1410 (40 min)  2nd session Pain: none Individual session  2nd session:  Addressed functional mobility, transfers, endurance, strength.  Propelled wc to gym and transferred to nustep with mod assist.  Pt. Use nustep for 10 minutes at 50 STP at 4 work load.  Left pt in wc in room with safety belt on and call bell,phone within reach at end of session.       Lisa Roca 06/22/2013, 10:10 AM

## 2013-06-23 MED ORDER — PROMETHAZINE HCL 12.5 MG RE SUPP: 12.5000 mg | RECTAL | Status: DC | PRN

## 2013-06-23 MED ORDER — PANTOPRAZOLE SODIUM 40 MG PO PACK
40.0000 mg | PACK | Freq: Every day | ORAL | Status: DC
  Administered 2013-06-26: 40 mg via ORAL
  Filled 2013-06-23 (×4): qty 20

## 2013-06-23 MED ORDER — PROMETHAZINE HCL 12.5 MG PO TABS
12.5000 mg | ORAL_TABLET | ORAL | Status: DC | PRN
  Administered 2013-06-23 – 2013-06-24 (×2): 12.5 mg via ORAL
  Filled 2013-06-23 (×2): qty 1

## 2013-06-23 MED ORDER — FERROUS SULFATE 300 (60 FE) MG/5ML PO SYRP
300.0000 mg | ORAL_SOLUTION | Freq: Every day | ORAL | Status: DC
  Filled 2013-06-23 (×3): qty 5

## 2013-06-23 MED ORDER — PROMETHAZINE HCL 25 MG/ML IJ SOLN: 12.5000 mg | INTRAMUSCULAR | Status: DC | PRN

## 2013-06-23 MED ORDER — FLORANEX PO PACK
1.0000 g | PACK | Freq: Three times a day (TID) | ORAL | Status: DC
  Administered 2013-06-24 – 2013-07-06 (×32): 1 g via ORAL
  Filled 2013-06-23 (×40): qty 1

## 2013-06-23 MED ORDER — POTASSIUM CHLORIDE 20 MEQ/15ML (10%) PO LIQD
20.0000 meq | Freq: Two times a day (BID) | ORAL | Status: DC
  Administered 2013-06-23: 20 meq via ORAL
  Filled 2013-06-23 (×4): qty 15

## 2013-06-23 MED ORDER — ADULT MULTIVITAMIN LIQUID CH
5.0000 mL | Freq: Every day | ORAL | Status: DC
  Filled 2013-06-23 (×3): qty 5

## 2013-06-23 NOTE — Progress Notes (Signed)
Patient ID: [name], female   DOB: Dec 01, 1924, 80 y.o.   MRN: [id number] [name] is a 80 y.o. female 01/08/1925 [id number]  Subjective: No new complaints. Continued weakness (general) and fatigue, no pain. No new problems. Slept well. Feeling OK.  Objective: Vital signs in last 24 hours: Temp:  [97.5 F (36.4 C)] 97.5 F (36.4 C) (01/31 1431) Pulse Rate:  [109] 109 (01/31 1431) Resp:  [16] 16 (01/31 1431) BP: (114)/(80) 114/80 mmHg (01/31 1431) SpO2:  [95 %] 95 % (01/31 1431) Weight change:  Last BM Date: 06/22/13  Intake/Output from previous day: 01/31 0701 - 02/01 0700 In: 700 [P.O.:700] Out: 1 [Urine:1]  Physical Exam General: No apparent distress    Lungs: Normal effort. Lungs clear to auscultation, no crackles or wheezes. Cardiovascular: Regular rate and rhythm, no edema Musculoskeletal:  Neurovascularly intact Neurological: No new neurological deficits  Lab Results: BMET    Component Value Date/Time   NA 137 06/20/2013 0925   K 4.6 06/20/2013 0925   CL 107 06/20/2013 0925   CO2 20 06/20/2013 0925   GLUCOSE 89 06/20/2013 0925   BUN 17 06/20/2013 0925   CREATININE 0.79 06/20/2013 0925   CALCIUM 7.2* 06/20/2013 0925   GFRNONAA 73* 06/20/2013 0925   GFRAA 84* 06/20/2013 0925   CBC    Component Value Date/Time   WBC 3.0* 06/20/2013 0820   RBC 3.31* 06/20/2013 0820   RBC 2.73* 12/08/2012 0828   HGB 10.2* 06/20/2013 0820   HGB 7.3* 01/24/2013 1440   HCT 29.7* 06/20/2013 0820   PLT 81* 06/20/2013 0820   MCV 89.7 06/20/2013 0820   MCH 30.8 06/20/2013 0820   MCHC 34.3 06/20/2013 0820   RDW 15.5 06/20/2013 0820   LYMPHSABS 0.3* 06/20/2013 0820   MONOABS 0.2 06/20/2013 0820   EOSABS 0.0 06/20/2013 0820   BASOSABS 0.0 06/20/2013 0820   CBG's (last 3):  No results found for this basename: GLUCAP,  in the last 72 hours LFT's Lab Results  Component Value Date   ALT 14 06/20/2013   AST 22 06/20/2013   ALKPHOS 75 06/20/2013   BILITOT 0.4 06/20/2013     Studies/Results: No results found.  Medications:  I have reviewed the patient's current medications. Scheduled Medications: . antiseptic oral rinse  15 mL Mouth Rinse BID  . aspirin  81 mg Oral Daily  . atorvastatin  20 mg Oral Q supper  . buPROPion  150 mg Oral Q breakfast  . feeding supplement (ENSURE COMPLETE)  237 mL Oral BID BM  . ferrous sulfate  325 mg Oral Q breakfast  . lidocaine  1 patch Transdermal Q24H  . multivitamin with minerals  1 tablet Oral Daily  . mycophenolate  1,000 mg Oral BID  . pantoprazole  40 mg Oral Daily  . potassium chloride  20 mEq Oral BID  . saccharomyces boulardii  500 mg Oral BID   PRN Medications: acetaminophen, acetaminophen, ALPRAZolam, ALPRAZolam, bisacodyl, HYDROcodone-acetaminophen, sodium chloride  Assessment/Plan: Principal Problem:   GBS (Guillain-Barre syndrome)  (? Myasthenia gravis). IVIG completed. Continue CellCept  2. DVT Prophylaxis/Anticoagulation: History of DVT with IVC filter  3. Pain Management: Hydrocodone as needed. Monitor with increased mobility  -added lidoderm patch for left low back/hip pain---suspect pain is from lumbar spondylosis/referral  3. Mood/anxiety/depression: Wellbutrin 150 mg daily, Xanax 0.25 mg twice daily as needed and 1 mg each bedtime as needed  4. Neuropsych: This patient is capable of making decisions on her own behalf.  5. Hypothyroidism. Synthroid  6.  Hyperlipidemia. Lipitor  7. Chronic anemia/thrombocytopenia. Improved. Continue iron supplement followup CBC  8. Sacral wounds. Appreciate WOC input and recs  -these are likely herpetic lesions/ HSV  -air mattress  9. Chronic loose stool: probiotic/diet control   Length of stay, days: 4    Valerie A. Asa Lente, MD 06/23/2013, 9:25 AM

## 2013-06-23 NOTE — Progress Notes (Signed)
Patient has had multiple watery bowel movements today with incontinent episodes. Refuses to go the the bathroom/BSC. She prefers to use the bedpan and stay on it for a while. Her lesions on her bottom gets soaked from her stool while she sits on the bedpan. Dr. Asa Lente notified. Order received for flexiseal. Will monitor.

## 2013-06-23 NOTE — Plan of Care (Signed)
Problem: RH BOWEL ELIMINATION Goal: RH STG MANAGE BOWEL WITH ASSISTANCE STG Manage Bowel with Mod I Assistance.  Outcome: Not Progressing flexiseal in place

## 2013-06-24 ENCOUNTER — Inpatient Hospital Stay (HOSPITAL_COMMUNITY): Payer: Medicare Other | Admitting: *Deleted

## 2013-06-24 ENCOUNTER — Inpatient Hospital Stay (HOSPITAL_COMMUNITY): Payer: Medicare Other | Admitting: Occupational Therapy

## 2013-06-24 ENCOUNTER — Encounter (HOSPITAL_COMMUNITY): Payer: Medicare Other | Admitting: *Deleted

## 2013-06-24 ENCOUNTER — Inpatient Hospital Stay (HOSPITAL_COMMUNITY): Payer: Medicare Other | Admitting: Physical Therapy

## 2013-06-24 DIAGNOSIS — G610 Guillain-Barre syndrome: Secondary | ICD-10-CM

## 2013-06-24 LAB — BASIC METABOLIC PANEL
BUN: 23 mg/dL (ref 6–23)
CO2: 19 mEq/L (ref 19–32)
CREATININE: 1.16 mg/dL — AB (ref 0.50–1.10)
Calcium: 7.3 mg/dL — ABNORMAL LOW (ref 8.4–10.5)
Chloride: 106 mEq/L (ref 96–112)
GFR, EST AFRICAN AMERICAN: 48 mL/min — AB (ref >90)
GFR, EST NON AFRICAN AMERICAN: 41 mL/min — AB (ref >90)
Glucose, Bld: 78 mg/dL (ref 70–99)
Potassium: 4.1 mEq/L (ref 3.7–5.3)
Sodium: 135 mEq/L — ABNORMAL LOW (ref 137–147)

## 2013-06-24 MED ORDER — PROMETHAZINE HCL 12.5 MG PO TABS: 12.5000 mg | ORAL_TABLET | Freq: Three times a day (TID) | ORAL | Status: DC

## 2013-06-24 MED ORDER — LOPERAMIDE HCL 2 MG PO CAPS
4.0000 mg | ORAL_CAPSULE | Freq: Two times a day (BID) | ORAL | Status: DC
  Filled 2013-06-24 (×3): qty 2

## 2013-06-24 MED ORDER — POTASSIUM CHLORIDE CRYS ER 20 MEQ PO TBCR
20.0000 meq | EXTENDED_RELEASE_TABLET | Freq: Two times a day (BID) | ORAL | Status: DC
  Administered 2013-06-25 – 2013-07-04 (×18): 20 meq via ORAL
  Filled 2013-06-24 (×21): qty 1

## 2013-06-24 MED ORDER — PROMETHAZINE HCL 12.5 MG PO TABS
12.5000 mg | ORAL_TABLET | Freq: Four times a day (QID) | ORAL | Status: DC | PRN
  Administered 2013-06-24 – 2013-06-25 (×2): 12.5 mg via ORAL
  Filled 2013-06-24 (×2): qty 1

## 2013-06-24 MED ORDER — LOPERAMIDE HCL 2 MG PO CAPS
4.0000 mg | ORAL_CAPSULE | Freq: Three times a day (TID) | ORAL | Status: DC
  Administered 2013-06-24 – 2013-06-27 (×9): 4 mg via ORAL
  Filled 2013-06-24 (×12): qty 2

## 2013-06-24 NOTE — Progress Notes (Signed)
Physical Therapy Session Note  Patient Details  Name: Keyshawna Prouse MRN: [id number] Date of Birth: November 21, 1924  Today's Date: 06/24/2013 Time: 1610-9604 Time Calculation (min): 45 min  Short Term Goals: Week 1:  PT Short Term Goal 1 (Week 1): Pt will transfer bed<> wheelchair with mod assist PT Short Term Goal 2 (Week 1): Pt will sit <> stand with mod assist PT Short Term Goal 3 (Week 1): Pt will propel w/c x 25' with mod assist PT Short Term Goal 4 (Week 1): Pt will verbalize understanding of mechanism for pressure relief and frequency for improved skin integrity   Skilled Therapeutic Interventions/Progress Updates:    Pt in bed with bucket reports she has vomited this morning, currently very nauseated. Declined getting OOB but agreeable to bil LE exercise however found with large leak of rectal tube. RN made aware, pt engaged in activity tolerance and rolling to be cleaned and repositioned. Pt requires max/+2 for rolling today. Discussed with nursing continuing rolling for pressure relief despite air mattress. Pt discouraged over weekend events and having rectal tube in place, provide emotional support and encouragement.   Therapy Documentation Precautions:  Precautions Precautions: Fall Restrictions Weight Bearing Restrictions: No General: Amount of Missed PT Time (min): 15 Minutes Missed Time Reason: Patient ill (comment) (nauseated) Pain:  Grimaces with Lt. Hip movement but declines medication  See FIM for current functional status  Therapy/Group: Individual Therapy  Lahoma Rocker 06/24/2013, 9:24 AM

## 2013-06-24 NOTE — Progress Notes (Signed)
[facility] Individual Statement of Services  Patient Name:  [name]  Date:  06/24/2013  Welcome to the [facility].  Our goal is to provide you with an individualized program based on your diagnosis and situation, designed to meet your specific needs.  With this comprehensive rehabilitation program, you will be expected to participate in at least 3 hours of rehabilitation therapies Monday-Friday, with modified therapy programming on the weekends.  Your rehabilitation program will include the following services:  Physical Therapy (PT), Occupational Therapy (OT), 24 hour per day rehabilitation nursing, Therapeutic Recreaction (TR), Case Management (Social Worker), Rehabilitation Medicine, Nutrition Services and Pharmacy Services  Weekly team conferences will be held on Tuesdays to discuss your progress.  Your Social Worker will talk with you frequently to get your input and to update you on team discussions.  Team conferences with you and your family in attendance may also be held.  Expected length of stay: 2 weeks  Overall anticipated outcome: minimal - moderate assist  Depending on your progress and recovery, your program may change. Your Social Worker will coordinate services and will keep you informed of any changes. Your Social Worker's name and contact numbers are listed  below.  The following services may also be recommended but are not provided by the [facility] will be made to provide these services after discharge if needed.  Arrangements include referral to agencies that provide these services.  Your insurance has been verified to be:  Medicare and [place] Your primary doctor is:  Dr. Alysia Penna  Pertinent information will be shared with your doctor and your insurance  company.  Social Worker:  [place], [place] or ([phone]   Information discussed with and copy given to patient by: Lennart Pall, 06/24/2013, 2:19 PM

## 2013-06-24 NOTE — Progress Notes (Signed)
Social Work  Social Work Assessment and Plan  Patient Details  Name: [name] MRN: [id number] Date of Birth: February 23, 1925  Today's Date: 06/21/2013  Problem List:  Patient Active Problem List   Diagnosis Date Noted  . GBS (Guillain-Barre syndrome) 06/19/2013  . Weakness 06/11/2013  . Proximal muscle weakness 06/11/2013  . Numbness of legs 06/11/2013  . Cardiomyopathy 04/22/2013  . Physical deconditioning 04/02/2013  . Pedal edema 04/01/2013  . Chronic systolic heart failure 27/78/2423  . Pulmonary hypertension 03/27/2013  . Hypokalemia 03/27/2013  . SBO (small bowel obstruction) 03/25/2013  . Ulcerative colitis   . Protein-calorie malnutrition, severe 03/24/2013  . Anemia of chronic disease 03/23/2013  . Acute respiratory failure with hypoxia 03/23/2013  . Atrial fibrillation with RVR 03/22/2013  . N&V (nausea and vomiting) 03/22/2013  . 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 Incisional hernia, incarcerated s/p lap repair 03/25/2013 03/22/2013  . NSTEMI (non-ST elevated myocardial infarction) 03/22/2013  . Acute renal failure 12/07/2012  . ANXIETY 12/26/2007  . HYPOTHYROIDISM 11/10/2006   Past Medical History:  Past Medical History  Diagnosis Date  . Anemia   . Hyperlipemia   . Hypertension   . Thyroid disease     hypothyroidism  . Anxiety   . Vitamin B 12 deficiency   . TIA (transient ischemic attack)     4-09  . Microscopic hematuria     benign microscopic hematuria, worked up b Dr Katrine Coho  . Gall stone     porcelain gall bladder with single stone  per CT 02-2009 Dr Newton Pigg  . Osteoporosis     DEXA on 04-30-10  . Myasthenia gravis     sees Dr. Krista Blue   . Ulcerative colitis   . DVT of leg (deep venous thrombosis) 12/2012    left leg; coumadin d/c b/c of LGI bleed; s/p IVC filter  . CMV colitis   . Cancer     uterine/ovarian  . Chronic systolic CHF (congestive heart failure)   . Cardiomyopathy     a. in setting of  incarcerated hernia, AF with RVR, ARF, NSTEMI => probable [place] CM given WM abnormality; Echocardiogram (03/23/13): Mod LVH, EF 30-35% with AK of the entire apical myocardium, AS/AL/Inf HK, Gr 1 DD, aortic sclerosis, MAC, mild MR, atrial septal lipomatous hypertrophy, mod TR, PASP 37;   Echo (04/2013): EF 60-65% normal wall motion, Gr 1 DD, MAC, mild to mod LAE  . Paroxysmal atrial fibrillation     not on coumadin due to AF in setting of acute illness as well as prior GI bleed on coumadin for DVT  . NSTEMI (non-ST elevated myocardial infarction)     a. 03/2013 in setting of incarcerated ventral hernia, SBO, ARF => likely Type 2 NSTEMI   Past Surgical History:  Past Surgical History  Procedure Laterality Date  . Cardiac catheterization      04/1999  . Abdominal hysterectomy      with oophorectomy  . Colonoscopy  1999    normal   . Colonoscopy N/A 12/10/2012    Procedure: COLONOSCOPY;  Surgeon: Gatha Mayer, MD;  Location: [facility];  Service: Endoscopy;  Laterality: N/A;  . Flexible sigmoidoscopy N/A 01/26/2013    Procedure: FLEXIBLE SIGMOIDOSCOPY;  Surgeon: Gatha Mayer, MD;  Location: [facility];  Service: Endoscopy;  Laterality: N/A;  . Ventral hernia repair N/A 03/25/2013    Procedure: LAPAROSCOPIC VENTRAL HERNIA REPAIR ;  Surgeon: Adin Hector, MD;  Location: [facility];  Service: General;  Laterality: N/A;  . Cholecystectomy N/A 03/25/2013    Procedure: LAPAROSCOPIC CHOLECYSTECTOMY WITH INTRAOPERATIVE CHOLANGIOGRAM;  Surgeon: Adin Hector, MD;  Location: [facility];  Service: General;  Laterality: N/A;  . Insertion of mesh N/A 03/25/2013    Procedure: INSERTION OF MESH;  Surgeon: Adin Hector, MD;  Location: [facility];  Service: General;  Laterality: N/A;  . Laparoscopic lysis of adhesions N/A 03/25/2013    Procedure: LAPAROSCOPIC LYSIS OF ADHESIONS;  Surgeon: Adin Hector, MD;  Location: [facility];  Service: General;  Laterality: N/A;  . Omentectomy N/A 03/25/2013    Procedure:  PARTIAL OMENTECTOMY;  Surgeon: Adin Hector, MD;  Location: [facility];  Service: General;  Laterality: N/A;   Social History:  reports that she has never smoked. She has never used smokeless tobacco. She reports that she does not drink alcohol or use illicit drugs.  Family / Support Systems Marital Status: Widow/Widower How Long?: 3 yrs Patient Roles: Parent Children: daughter, Virl Son @ [phone] or (C) [phone] and daughter, Lucie Leather @ [phone] or [phone] Other Supports: son-in-law, Charlena Cross @ [phone] Anticipated Caregiver: [place], son in law Ability/Limitations of Caregiver: no limitations, son in law is retired Careers adviser: 24/7 Family Dynamics: daughters very involved and supportive - daughter, [name], does indicate that son-in-law has been providing more direct assist since pt's decline began in June 2014  Social History Preferred language: English Religion: Protestant Cultural Background: pt originally from [place] but in the [place] for 40+ yrs  (Husband was from [place]) Education: HS Read: Yes Write: Yes Employment Status: Retired Freight forwarder Issues: none Guardian/Conservator: none - per MD, pt capable of making decisions on her own behalf   Abuse/Neglect Physical Abuse: Denies Verbal Abuse: Denies Sexual Abuse: Denies Exploitation of patient/patient's resources: Denies Self-Neglect: Denies  Emotional Status Pt's affect, behavior adn adjustment status: Pt lying in bed and admits fatigue and nausea.  Familiar with this SW from two prior CIR stays.  Pt hopeful she will make progress with this stay as she has done previously.  Denies any significant emotional distress - will monitor Recent Psychosocial Issues: physical/ functional decline since last summer and increasing dependence on family Pyschiatric History: None Substance Abuse History: None  Patient / Family Perceptions, Expectations & Goals Pt/Family understanding  of illness & functional limitations: Pt and family with general understanding of admitting, suspected diagnosis of MG and of treatment performed.  Good understanding of the purpose of CIR.   Premorbid pt/family roles/activities: Family was assisting pt as needed at home PTA with son-in-law the primary support.  Pt had d/c'd home after prior CIR stays functioning at a mod I - supervision level.   Anticipated changes in roles/activities/participation: goals with this admit look to be min/ mod assist overall which will mean that family will need to increase the amount of assist they provide.  Son-in-law will need to assume more caregiving responsibilities. Pt/family expectations/goals: Pt states she wants to "feel better".  Daughter [name]) states, "We just want to be able to avoid a nursing home if we can".  Community [employer] Agencies: None Premorbid Home Care/DME Agencies: Other (Comment) ([place]) Transportation available at discharge: yes Resource referrals recommended: Psychology  Discharge Planning Living Arrangements: Children Support Systems: Children Type of Residence: Private residence Insurance Resources: [place] (specify) Web designer) Financial Resources: Radio broadcast assistant Screen Referred: No Living Expenses: Lives with family Money Management: Family Does the patient have any problems obtaining your medications?: No  Home Management: family Patient/Family Preliminary Plans: family and pt hopeful that she will regain enough strength and function that she can return home.  Daughter states, "I know we aren't going to cure her...she's dying..." Social Work Anticipated Follow Up Needs: HH/OP;SNF Expected length of stay: 15-20 days  Clinical Impression Frail, elderly appearing woman here on CIR again for the 3rd time since 01/2013.  Familiar to this SW as I have followed her on both previous admits.  Family still attempting to provide care for pt  at home, however, daughter notes that she is concerned that pt declining to the point that they may have difficulty providing care at home this time.  Will follow for support and d/c planning.  Logan Vegh 06/21/2013, 2:16 PM

## 2013-06-24 NOTE — Progress Notes (Signed)
Subjective/Complaints: Rectal tube placed because of diarrhea. Pt wants her immodium A 12 point review of systems has been performed and if not noted above is otherwise negative.   Objective: Vital Signs: Blood pressure 122/72, pulse 79, temperature 98 F (36.7 C), temperature source Oral, resp. rate 18, height 5\' 4"  (1.626 m), SpO2 99.00%. No results found. No results found for this basename: WBC, HGB, HCT, PLT,  in the last 72 hours  Recent Labs  06/24/13 0454  NA 135*  K 4.1  CL 106  GLUCOSE 78  BUN 23  CREATININE 1.16*  CALCIUM 7.3*   CBG (last 3)  No results found for this basename: GLUCAP,  in the last 72 hours  Wt Readings from Last 3 Encounters:  06/17/13 66.9 kg (147 lb 7.8 oz)  05/31/13 70.761 kg (156 lb)  05/07/13 74.39 kg (164 lb)    Physical Exam:  Constitutional: She is oriented to person, place. Urine malodorous  Frail 80 year old female.  HENT:  Head: Normocephalic.  Eyes: EOM are normal.  Neck: Normal range of motion. Neck supple. No thyromegaly present.  Cardiovascular: Normal rate and regular rhythm. No murmurs. No gallops  Respiratory: Effort normal and breath sounds normal. No respiratory distress. No wheezes, rales  GI: Soft. Bowel sounds are normal. She exhibits no distension. Rectal tube Neurological: She is alert and oriented to place. Slow to arouse this am.follows all simple commands  Follows simple commands. UE 4-prox with deltoid, bicep to 4 distally. LE's 1 with LEFt HF (pain component), RIGHT HF 3. KE 2- left, 2+ right, ankles 3/5. Decreased LT at both feet to about the malleoli, no ptosis, no dsyarthria, CN exam intact  Skin: persistent gluteal/perineal/waist line with a few patches of healing round lesions. Appear to be resolving herpetic lesions. Area generally clean, granulating, no unopened vesicles. Psychiatric: She has a normal mood and affect. Her behavior is normal. Judgment and thought content normal       Assessment/Plan: 1. Functional deficits secondary to Guillain Barre Syndrome which require 3+ hours per day of interdisciplinary therapy in a comprehensive inpatient rehab setting. Physiatrist is providing close team supervision and 24 hour management of active medical problems listed below. Physiatrist and rehab team continue to assess barriers to discharge/monitor patient progress toward functional and medical goals. FIM: FIM - Bathing Bathing Steps Patient Completed: Chest;Right Arm;Left Arm;Abdomen;Front perineal area;Right upper leg;Left upper leg Bathing: 3: Mod-Patient completes 5-7 5f 10 parts or 50-74%  FIM - Upper Body Dressing/Undressing Upper body dressing/undressing steps patient completed: Thread/unthread right sleeve of pullover shirt/dresss;Thread/unthread left sleeve of pullover shirt/dress;Put head through opening of pull over shirt/dress;Pull shirt over trunk Upper body dressing/undressing: 5: Set-up assist to: Obtain clothing/put away FIM - Lower Body Dressing/Undressing Lower body dressing/undressing steps patient completed: Pull underwear up/down;Pull pants up/down Lower body dressing/undressing: 2: Max-Patient completed 25-49% of tasks  FIM - Toileting Toileting steps completed by patient: Performs perineal hygiene Toileting Assistive Devices: Grab bar or rail for support Toileting: 3: Mod-Patient completed 2 of 3 steps  FIM - Radio producer Devices: Elevated toilet seat Toilet Transfers: 3-To toilet/BSC: Mod A (lift or lower assist);3-From toilet/BSC: Mod A (lift or lower assist)  FIM - Bed/Chair Transfer Bed/Chair Transfer Assistive Devices: Bed rails;Arm rests;Walker Bed/Chair Transfer: 3: Chair or W/C > Bed: Mod A (lift or lower assist);3: Bed > Chair or W/C: Mod A (lift or lower assist)  FIM - Locomotion: Wheelchair Locomotion: Wheelchair: 1: Travels less than 50 ft with minimal  assistance (Pt.>75%) FIM - Locomotion:  Ambulation Locomotion: Ambulation Assistive Devices: Walker - Rolling Ambulation/Gait Assistance: 1: +2 Total assist Locomotion: Ambulation: 1: Two helpers  Comprehension Comprehension Mode: Auditory Comprehension: 6-Follows complex conversation/direction: With extra time/assistive device  Expression Expression Mode: Verbal Expression: 4-Expresses basic 75 - 89% of the time/requires cueing 10 - 24% of the time. Needs helper to occlude trach/needs to repeat words.  Social Interaction Social Interaction: 4-Interacts appropriately 75 - 89% of the time - Needs redirection for appropriate language or to initiate interaction.  Problem Solving Problem Solving: 3-Solves basic 50 - 74% of the time/requires cueing 25 - 49% of the time  Memory Memory: 4-Recognizes or recalls 75 - 89% of the time/requires cueing 10 - 24% of the time Medical Problem List and Plan:  1. Suspect GBS. (? Myasthenia gravis). IVIG completed. Continue CellCept  2. DVT Prophylaxis/Anticoagulation: History of DVT with IVC filter  3. Pain Management: Hydrocodone as needed. Monitor with increased mobility  -added lidoderm patch for left low back/hip pain---suspect pain is from lumbar spondylosis/referral  3. Mood/anxiety/depression: Wellbutrin 150 mg daily, Xanax 0.25 mg twice daily as needed and 1 mg each bedtime as needed  4. Neuropsych: This patient is capable of making decisions on her own behalf.  5. Hypothyroidism. Synthroid  6. Hyperlipidemia. Lipitor  7. Chronic anemia/thrombocytopenia. Latest hemoglobin 8.6 and platelet count 84,000 improved from 67,000. Continue iron supplement followup CBC  8. Sacral wounds. Appreciate WOC input and recs -these are likely herpetic lesions/ HSV  -by their appearance it's really too late to treat.  -air mattress  9. Chronic loose stool: probiotic/will continue rectal tube this am  -start imodium scheduled   LOS (Days) 5 A FACE TO FACE EVALUATION WAS  PERFORMED  Joedy Eickhoff T 06/24/2013 7:46 AM

## 2013-06-24 NOTE — Progress Notes (Signed)
Occupational Therapy Note  Patient Details  Name: [name] MRN: [id number] Date of Birth: [birth year] Today's Date: 06/24/2013   Cancelled session:    Session 1   Pt. Lying in bed with pink basin lying beside her.  She is nauseated and now has rectal  tube inserted for chronic diarrhea.  Pt refused bathing and dressing or UE therapeutic exercise.  Sheral Flow, RN of pt's choice.  Left pt in bed with call bell in reach and bedside table in reach.     2nd session:   Time:  1330-1415   (45 min) Pain: none noted Individual session  Pt lying in bed upon OT arrival.  Pt agreed to engage in UE exercises.  Did BUE in all ranges.  Did rolling for pressure relief.  Cleaned peri area form drainage from rectal tubing.  Pt. Rolled side to side with mod assist.  Left pt in bed with call bell in hand.    Lisa Roca 06/24/2013, 10:43 AM

## 2013-06-24 NOTE — Progress Notes (Signed)
Patent unable to tolerate swallowing crushed, whole, or liquid formed medication.  Two capsuls were found by the Nurse Tech in the patients stool intact. Dr. Francella Solian made aware of the issue. Orders given for Phenergan 12.26m Q 4 hrs prn for nausea vomiting. adm

## 2013-06-24 NOTE — Progress Notes (Signed)
Occupational Therapy Session Note  Patient Details  Name: [name] MRN: [id number] Date of Birth: [birth year]  Today's Date: 06/24/2013  Short Term Goals: Week 1:  OT Short Term Goal 1 (Week 1): Patient will complete upper body bathing and dressing with min assist OT Short Term Goal 2 (Week 1): Patient will complete lower body bathing and dressing with max assist OT Short Term Goal 3 (Week 1): Patient will complete toilet transfer with mod assist OT Short Term Goal 4 (Week 1): Patient will demo ability to perform sit <> stand during ADL to pull-up underwear and/or pants with mod assist  Skilled Therapeutic Interventions/Progress Updates:  Patient missed 30 minutes of skilled therapy secondary to refusal secondary to nausea and diarrhea; RN made aware.   Precautions:  Precautions Precautions: Fall Restrictions Weight Bearing Restrictions: No  See FIM for current functional status  Therapy/Group: Individual Therapy  Kylie Gros 06/24/2013, 11:17 AM

## 2013-06-25 ENCOUNTER — Inpatient Hospital Stay (HOSPITAL_COMMUNITY): Payer: Medicare Other | Admitting: Physical Therapy

## 2013-06-25 ENCOUNTER — Inpatient Hospital Stay (HOSPITAL_COMMUNITY): Payer: Medicare Other

## 2013-06-25 LAB — CLOSTRIDIUM DIFFICILE BY PCR: Toxigenic C. Difficile by PCR: NEGATIVE

## 2013-06-25 MED ORDER — PROCHLORPERAZINE MALEATE 10 MG PO TABS
10.0000 mg | ORAL_TABLET | Freq: Three times a day (TID) | ORAL | Status: DC
  Administered 2013-06-25 – 2013-07-06 (×33): 10 mg via ORAL
  Filled 2013-06-25 (×37): qty 1

## 2013-06-25 MED ORDER — SODIUM CHLORIDE 0.45 % IV SOLN
INTRAVENOUS | Status: DC
  Administered 2013-06-25 – 2013-07-04 (×11): via INTRAVENOUS
  Administered 2013-07-05: 1000 mL via INTRAVENOUS

## 2013-06-25 MED ORDER — PROCHLORPERAZINE MALEATE 5 MG PO TABS
5.0000 mg | ORAL_TABLET | Freq: Three times a day (TID) | ORAL | Status: DC
  Filled 2013-06-25 (×4): qty 1

## 2013-06-25 NOTE — Plan of Care (Signed)
Problem: RH SKIN INTEGRITY Goal: RH STG SKIN FREE OF INFECTION/BREAKDOWN Pt will have no new skin breakdown/infection while on rehab with min assist for general skin care and total assist of caregiver for care of buttocks and peri area.  Outcome: Not Progressing Pt incontinent

## 2013-06-25 NOTE — Progress Notes (Signed)
Subjective/Complaints: Stool a little better. Still loose. Feeling nauseas since yesterday.  A 12 point review of systems has been performed and if not noted above is otherwise negative.   Objective: Vital Signs: Blood pressure 116/75, pulse 79, temperature 98.3 F (36.8 C), temperature source Oral, resp. rate 17, height 5\' 4"  (1.626 m), SpO2 98.00%. No results found. No results found for this basename: WBC, HGB, HCT, PLT,  in the last 72 hours  Recent Labs  06/24/13 0454  NA 135*  K 4.1  CL 106  GLUCOSE 78  BUN 23  CREATININE 1.16*  CALCIUM 7.3*   CBG (last 3)  No results found for this basename: GLUCAP,  in the last 72 hours  Wt Readings from Last 3 Encounters:  06/17/13 66.9 kg (147 lb 7.8 oz)  05/31/13 70.761 kg (156 lb)  05/07/13 74.39 kg (164 lb)    Physical Exam:  Constitutional: She is oriented to person, place. Urine malodorous  Frail 80 year old female.  HENT:  Head: Normocephalic.  Eyes: EOM are normal.  Neck: Normal range of motion. Neck supple. No thyromegaly present.  Cardiovascular: Normal rate and regular rhythm. No murmurs. No gallops  Respiratory: Effort normal and breath sounds normal. No respiratory distress. No wheezes, rales  GI: Soft. Bowel sounds are normal. She exhibits no distension. Rectal tube Neurological: She is alert and oriented to place. Slow to arouse this am.follows all simple commands  Follows simple commands. UE 4-prox with deltoid, bicep to 4 distally. LE's 1 with LEFt HF (pain component), RIGHT HF 3. KE 2- left, 2+ right, ankles 3/5. Decreased LT at both feet to about the malleoli, no ptosis, no dsyarthria, CN exam intact  Skin: persistent gluteal/perineal/waist line with a few patches of healing round lesions. Appear to be resolving herpetic lesions. Area generally clean, granulating, no unopened vesicles. Psychiatric: She has a normal mood and affect. Her behavior is normal. Judgment and thought content normal       Assessment/Plan: 1. Functional deficits secondary to Guillain Barre Syndrome which require 3+ hours per day of interdisciplinary therapy in a comprehensive inpatient rehab setting. Physiatrist is providing close team supervision and 24 hour management of active medical problems listed below. Physiatrist and rehab team continue to assess barriers to discharge/monitor patient progress toward functional and medical goals. FIM: FIM - Bathing Bathing Steps Patient Completed: Chest;Right Arm;Left Arm;Abdomen;Front perineal area;Right upper leg;Left upper leg Bathing: 3: Mod-Patient completes 5-7 11f 10 parts or 50-74%  FIM - Upper Body Dressing/Undressing Upper body dressing/undressing steps patient completed: Thread/unthread right sleeve of pullover shirt/dresss;Thread/unthread left sleeve of pullover shirt/dress;Put head through opening of pull over shirt/dress;Pull shirt over trunk Upper body dressing/undressing: 5: Set-up assist to: Obtain clothing/put away FIM - Lower Body Dressing/Undressing Lower body dressing/undressing steps patient completed: Pull underwear up/down;Pull pants up/down Lower body dressing/undressing: 2: Max-Patient completed 25-49% of tasks  FIM - Toileting Toileting steps completed by patient: Performs perineal hygiene Toileting Assistive Devices: Grab bar or rail for support Toileting: 3: Mod-Patient completed 2 of 3 steps  FIM - Radio producer Devices: Elevated toilet seat Toilet Transfers: 3-To toilet/BSC: Mod A (lift or lower assist);3-From toilet/BSC: Mod A (lift or lower assist)  FIM - Bed/Chair Transfer Bed/Chair Transfer Assistive Devices: Bed rails;Arm rests;Walker Bed/Chair Transfer: 3: Chair or W/C > Bed: Mod A (lift or lower assist);3: Bed > Chair or W/C: Mod A (lift or lower assist)  FIM - Locomotion: Wheelchair Locomotion: Wheelchair: 1: Travels less than 50 ft with  minimal assistance (Pt.>75%) FIM - Locomotion:  Ambulation Locomotion: Ambulation Assistive Devices: Walker - Rolling Ambulation/Gait Assistance: 1: +2 Total assist Locomotion: Ambulation: 1: Two helpers  Comprehension Comprehension Mode: Auditory Comprehension: 6-Follows complex conversation/direction: With extra time/assistive device  Expression Expression Mode: Verbal Expression: 4-Expresses basic 75 - 89% of the time/requires cueing 10 - 24% of the time. Needs helper to occlude trach/needs to repeat words.  Social Interaction Social Interaction: 4-Interacts appropriately 75 - 89% of the time - Needs redirection for appropriate language or to initiate interaction.  Problem Solving Problem Solving: 3-Solves basic 50 - 74% of the time/requires cueing 25 - 49% of the time  Memory Memory: 4-Recognizes or recalls 75 - 89% of the time/requires cueing 10 - 24% of the time Medical Problem List and Plan:  1. Suspect GBS. (? Myasthenia gravis). IVIG completed. Continue CellCept  2. DVT Prophylaxis/Anticoagulation: History of DVT with IVC filter  3. Pain Management: Hydrocodone as needed. Monitor with increased mobility  -added lidoderm patch for left low back/hip pain---suspect pain is from lumbar spondylosis/referral  3. Mood/anxiety/depression: Wellbutrin 150 mg daily, Xanax 0.25 mg twice daily as needed and 1 mg each bedtime as needed  4. Neuropsych: This patient is capable of making decisions on her own behalf.  5. Hypothyroidism. Synthroid  6. Hyperlipidemia. Lipitor  7. Chronic anemia/thrombocytopenia. Latest hemoglobin 8.6 and platelet count 84,000 improved from 67,000.   iron supplement held.  followup CBC  8. Sacral wounds. Appreciate WOC input and recs -  likely herpetic lesions/ HSV  -by their appearance it's really too late to treat.  -air mattress  9. Chronic loose stool: probiotic  -started imodium scheduled  -would like to dc the rectal tube  -stopped iron and multivit due to nausea  -re-check c diff due to recent  increase   LOS (Days) 6 A FACE TO FACE EVALUATION WAS PERFORMED  Marieanne Marxen T 06/25/2013 7:31 AM

## 2013-06-25 NOTE — Progress Notes (Signed)
Physical Therapy Session Note  Patient Details  Name: [name] MRN: [id number] Date of Birth: [birth year]  Today's Date: 06/25/2013 Time: 7096-2836 Time Calculation (min): 35 min    Short Term Goals: Week 1:  PT Short Term Goal 1 (Week 1): Pt will transfer bed<> wheelchair with mod assist PT Short Term Goal 2 (Week 1): Pt will sit <> stand with mod assist PT Short Term Goal 3 (Week 1): Pt will propel w/c x 25' with mod assist PT Short Term Goal 4 (Week 1): Pt will verbalize understanding of mechanism for pressure relief and frequency for improved skin integrity   Skilled Therapeutic Interventions/Progress Updates:    MD verbally cleared activity as tolerated. Pt declined to get out of bed but agreeable to supine LE exercises: resisted hip/knee extension 4 x 10 reps bilaterally, ankle plantarflexion/dorsiflexion with passive overstretch to heel cord x 10 reps, pillow squeeze for hip adduction x 10 reps of 10 sec holds, and manually resisted hip abduction x 10 reps. Pt performed bil. Rolling with mod/max assist to fully complete roll on air mattress. Pt mentioned nausea again, PT suggested an icy pop - pt agreeable and PT retrieved one for pt to try.   When questioned about unresponsive episode pt reports she remembers setting up for the transfer, standing to the walker then the next thing she remembered was lying in the bed. Pt has no recall of the actual transfer.   Therapy Documentation Precautions:  Precautions Precautions: Fall Restrictions Weight Bearing Restrictions: No Pain: No c/o pain  See FIM for current functional status  Therapy/Group: Individual Therapy  [place] Rocker 06/25/2013, 3:23 PM

## 2013-06-25 NOTE — Progress Notes (Signed)
Had a long discussion with daughter in reference to patient's nausea and diarrhea prior to admission. She did take scheduled Imodium 3 times a day as well as Compazine 3 times a day prior to admission with good results. We'll resume these at patient family request and monitored

## 2013-06-25 NOTE — Progress Notes (Signed)
Physical Therapy Session Note  Patient Details  Name: [name] MRN: [id number] Date of Birth: [birth year]  Today's Date: 06/25/2013 Time: 0900-0930 Time Calculation (min): 30 min  Short Term Goals: Week 1:  PT Short Term Goal 1 (Week 1): Pt will transfer bed<> wheelchair with mod assist PT Short Term Goal 2 (Week 1): Pt will sit <> stand with mod assist PT Short Term Goal 3 (Week 1): Pt will propel w/c x 25' with mod assist PT Short Term Goal 4 (Week 1): Pt will verbalize understanding of mechanism for pressure relief and frequency for improved skin integrity   Skilled Therapeutic Interventions/Progress Updates:   Pain: no c/o pain  Pt recalled throwing up yesterday - still nauseated today. TEDs donned. Agreeable to get OOB but slow moving. Heavy mod assist supine> sit. Extended time sitting EOB to allow pt to decrease dizziness reports room spinning dizziness. No nystagmus noted however pt does appear to have worsening symptoms with positional changes. Attempted visual targeting without decreased symptoms. Stand pivot transfer with max assist, posterior lean and requires PT to manage RW. Step by step verbal cues needed. Pt left with OT.    Second Session Time:  1130-1200 Time Calculation (min): 30 min Skilled Therapeutic Interventions/Progress Updates:  Pain: Lt. Hip/femur faces 4/10 repositioned Pt seated in w/c at start. PT retrieved tilt-in-space wheelchair for in-between therapy sessions so that nursing will be better able to provide pressure relief. Will leave standard chair in room for practice of propulsion during therapies. Pt requested to return to bed. PT set-up w/c for transfer, sit > stand with mod assist and began stand pivot. Pt step-pivoted ~2 steps then suddenly sat down (1/2 on bed, 1/2 on w/c) with guidance of PT to prevent fall. Therapist called for assist to make sure rest of transfer happened smoothly. Pt then progressively became less response and therapist  completed total assist +1 transfer back to bed, pt became totally limp through trunk and LEs but had what appeared to be involuntary tremor movements of UEs - unresponsive to therapist. RN arrived to room and pt returned to supine with +2 assist, continued to not response to verbal or tactile stimulation - eyes not focusing or moving to stimulus. RN called rapid response and team came quickly. After a few minutes pt began to respond and report dizziness. Pt left with nursing for further evaluation.    Therapy Documentation Precautions:  Precautions Precautions: Fall Restrictions Weight Bearing Restrictions: No  See FIM for current functional status  Therapy/Group: Individual Therapy both sessions  Lahoma Rocker 06/25/2013, 12:37 PM

## 2013-06-25 NOTE — Plan of Care (Signed)
Problem: RH BOWEL ELIMINATION Goal: RH STG MANAGE BOWEL W/MEDICATION W/ASSISTANCE STG Manage Bowel with Medication with [place].  Outcome: Not Progressing Currently prescribed Imodium

## 2013-06-25 NOTE — Plan of Care (Signed)
Problem: RH BOWEL ELIMINATION Goal: RH STG MANAGE BOWEL WITH ASSISTANCE STG Manage Bowel with Mod I Assistance.  Outcome: Not Progressing Pt has rectal tube at this time

## 2013-06-25 NOTE — Progress Notes (Signed)
Nursing staff got call from Ladona Horns, PT in patients room saying she needed transfer help STAT.  Upon arrival to room, patient was slumped over Hannahs shoulder.  She mentioned she thought the patient was having a seizure due to jerking movements.  Patient was found to be unresponsive.  She would not respond to verbal commands.  Eyes were wide open and fixed on ceiling.  Left room to get crash cart.  Notified the primary nurse of new developments and called Rapid Response.  Patient was already starting to respond once I returned with the crash cart.  Marlowe [place], PA responded, patient was alert when he arrived.  No further orders placed at this time.  Will continue to monitor.  Brita Romp, RN

## 2013-06-25 NOTE — Progress Notes (Signed)
Occupational Therapy Session Note  Patient Details  Name: [name] MRN: [id number] Date of Birth: [birth year]  Today's Date: 06/25/2013 Time: 0930-1030 Time Calculation (min): 60 min  Short Term Goals: Week 1:  OT Short Term Goal 1 (Week 1): Patient will complete upper body bathing and dressing with min assist OT Short Term Goal 2 (Week 1): Patient will complete lower body bathing and dressing with max assist OT Short Term Goal 3 (Week 1): Patient will complete toilet transfer with mod assist OT Short Term Goal 4 (Week 1): Patient will demo ability to perform sit <> stand during ADL to pull-up underwear and/or pants with mod assist  Skilled Therapeutic Interventions/Progress Updates: ADL-retraining at w/c level, sink side, with focus on improved activity tolerance, grooming, dynamic sitting balance, and safety awareness.   Patient received in w/c, receptive to bathing but expressed preference to remain in gown for lower body due to ongoing RN wound care management.   Patient required setup assist and numerous cues to initiate bathing task during this session due to lethargy.  Patient was only able to initiate bathing upper body but ceased active participation after washing her hands and face, requiring total assist to complete task while in w/c.  Patient donned shirt with setup assist and accepted challenge to don socks but fatigued after donning right sock.   Patient reported poor appetite with am meal but then accepted setup for self-feeding with alternate food of her choice (Rice [employer] and orange juice), at end of session.  Therapy Documentation Precautions:  Precautions Precautions: Fall Restrictions Weight Bearing Restrictions: No  Pain: No report of pain   ADL: ADL ADL Comments: see FIM  See FIM for current functional status  Therapy/Group: Individual Therapy  Second session: Time: 1300-1320 Time Calculation (min):  20 min  Pain Assessment: No report of pain    Skilled Therapeutic Interventions: Patient received supine in bed, alert and oriented although complaining of nausea and dizziness.   Patient receptive to limited treatment but participation was interrupted by emesis into wash basin.   Patient required extra time and close monitoring for safety but remained alert and responsive throughout session.  Patient accepted wash cloth presented to wash her face, requested refreshment (sips of ginger ale) and reported no energy to continue session due to continued nausea and fatigue.  See FIM for current functional status  Therapy/Group: Individual Therapy  [place] 06/25/2013, 12:14 PM

## 2013-06-25 NOTE — Plan of Care (Signed)
Problem: RH BLADDER ELIMINATION Goal: RH STG MANAGE BLADDER WITH ASSISTANCE STG Manage Bladder With Mod I Assistance.  Outcome: Not Progressing Pt has been incontinent

## 2013-06-26 ENCOUNTER — Inpatient Hospital Stay (HOSPITAL_COMMUNITY): Payer: Medicare Other

## 2013-06-26 ENCOUNTER — Inpatient Hospital Stay (HOSPITAL_COMMUNITY): Payer: Medicare Other | Admitting: Physical Therapy

## 2013-06-26 LAB — BASIC METABOLIC PANEL WITH GFR
BUN: 34 mg/dL — ABNORMAL HIGH (ref 6–23)
CO2: 17 meq/L — ABNORMAL LOW (ref 19–32)
Calcium: 7.2 mg/dL — ABNORMAL LOW (ref 8.4–10.5)
Chloride: 108 meq/L (ref 96–112)
Creatinine, Ser: 1.45 mg/dL — ABNORMAL HIGH (ref 0.50–1.10)
GFR calc Af Amer: 36 mL/min — ABNORMAL LOW (ref >90)
GFR calc non Af Amer: 31 mL/min — ABNORMAL LOW (ref >90)
Glucose, Bld: 89 mg/dL (ref 70–99)
Potassium: 4.7 meq/L (ref 3.7–5.3)
Sodium: 137 meq/L (ref 137–147)

## 2013-06-26 LAB — CBC
HCT: 27.7 % — ABNORMAL LOW (ref 36.0–46.0)
Hemoglobin: 9.3 g/dL — ABNORMAL LOW (ref 12.0–15.0)
MCH: 30.6 pg (ref 26.0–34.0)
MCHC: 33.6 g/dL (ref 30.0–36.0)
MCV: 91.1 fL (ref 78.0–100.0)
Platelets: 79 K/uL — ABNORMAL LOW (ref 150–400)
RBC: 3.04 MIL/uL — ABNORMAL LOW (ref 3.87–5.11)
RDW: 15.8 % — ABNORMAL HIGH (ref 11.5–15.5)
WBC: 3.8 K/uL — ABNORMAL LOW (ref 4.0–10.5)

## 2013-06-26 MED ORDER — MEGESTROL ACETATE 400 MG/10ML PO SUSP
400.0000 mg | Freq: Two times a day (BID) | ORAL | Status: DC
  Administered 2013-06-26 – 2013-07-03 (×16): 400 mg via ORAL
  Filled 2013-06-26 (×20): qty 10

## 2013-06-26 MED ORDER — PANTOPRAZOLE SODIUM 40 MG PO TBEC
40.0000 mg | DELAYED_RELEASE_TABLET | Freq: Every day | ORAL | Status: DC
  Administered 2013-06-26 – 2013-07-06 (×11): 40 mg via ORAL
  Filled 2013-06-26 (×12): qty 1

## 2013-06-26 NOTE — Plan of Care (Signed)
Problem: RH BLADDER ELIMINATION Goal: RH STG MANAGE BLADDER WITH ASSISTANCE STG Manage Bladder With Mod I Assistance.  Outcome: Progressing Continent today with every 3 hours toileting, depends in use

## 2013-06-26 NOTE — Progress Notes (Signed)
Physical Therapy Note  Patient Details  Name: [name] MRN: [id number] Date of Birth: 03-02-1925 Today's Date: 06/26/2013  1000 -1055 (55 minutes) individual Pain: no complaint of pain Other: 2/10 c/o dizziness in sitting (BP sitting 121/82 pulse 89 ; 10/10 dizziness in standing (unable to obtain standing BP); dizziness increases with position changes Focus of treatment: Therapeutic activities focusing on monitoring dizziness; functional mobility Treatment: Pt up in wc upon arrival with complaints as above; sit to stand from wc to RW max assist with immediate c/o of increased dizziness (no nausea); transfer stand /turn wc >< mat max assist stand/turn with max posterior trunk lean; sit to supine (mat) mod/max assist trunk, bilateral LEs with decreasing c/o dizziness; rolling to left to decrease c/o neck pain mod assist; supine to sit (mat) mod  Assist with increased time ; transfer mat to wc (scoot ) max assist; returned to room with all needs within reach.    9233-0076 (30 minutes) individual Pain: no reported pain Focus of treatment: transfer training; bed mobility training Treatment: Nursing attempting to toilet patient in bathroom; transfer squat/ pivot max assist with mod posterior trunk lean; pt unable to tolerate sitting on commode secondary to increased c/o dizziness; transfer to bed max assist (squat/pivot) ; sit to supine max assist (bed); rolling left or right using bedrails min assist with vcs for positioning LEs to roll .   1545 -1615 (30 minutes) individual Pain: no reported pain Other: 5/10 dizziness in supine Treatment: Pt in bed upon arrival; therapeutic exercises focused on bilateral LE strengthening in supine X 15- ankle pumps, heel slides , hip/knee extension, hip abduction; supine to side to sit mod/max assist; sit to stand to RW X 2 max assist (pt attempts to stand with narrow BOS); pt able to tolerate standing < 30 seconds before c/o of 10/10 dizziness; sit to supine  mod/max assist ; all needs within reach and bed alarm activated.   Carman Essick,JIM 06/26/2013, 10:34 AM

## 2013-06-26 NOTE — Plan of Care (Signed)
Problem: RH BOWEL ELIMINATION Goal: RH STG MANAGE BOWEL W/MEDICATION W/ASSISTANCE STG Manage Bowel with Medication with [place].  Outcome: Progressing Pt on scheduled imodium

## 2013-06-26 NOTE — Progress Notes (Signed)
Subjective/Complaints: Feels better today. Stools less frequent.stilll has rectal tube. Has poor appetite A 12 point review of systems has been performed and if not noted above is otherwise negative.   Objective: Vital Signs: Blood pressure 147/69, pulse 88, temperature 97.8 F (36.6 C), temperature source Oral, resp. rate 17, height 5\' 4"  (1.626 m), weight 64.3 kg (141 lb 12.1 oz), SpO2 97.00%. No results found.  Recent Labs  06/26/13 0435  WBC 3.8*  HGB 9.3*  HCT 27.7*  PLT 79*    Recent Labs  06/24/13 0454 06/26/13 0435  NA 135* 137  K 4.1 4.7  CL 106 108  GLUCOSE 78 89  BUN 23 34*  CREATININE 1.16* 1.45*  CALCIUM 7.3* 7.2*   CBG (last 3)  No results found for this basename: GLUCAP,  in the last 72 hours  Wt Readings from Last 3 Encounters:  06/26/13 64.3 kg (141 lb 12.1 oz)  06/17/13 66.9 kg (147 lb 7.8 oz)  05/31/13 70.761 kg (156 lb)    Physical Exam:  Constitutional: She is oriented to person, place. Urine malodorous  Frail 80 year old female.  HENT:  Head: Normocephalic.  Eyes: EOM are normal.  Neck: Normal range of motion. Neck supple. No thyromegaly present.  Cardiovascular: Normal rate and regular rhythm. No murmurs. No gallops  Respiratory: Effort normal and breath sounds normal. No respiratory distress. No wheezes, rales  GI: Soft. Bowel sounds are normal. She exhibits no distension. Rectal tube still with soft, liquid stool Neurological: She is alert and oriented to place. Slow to arouse this am.follows all simple commands  Follows simple commands. UE 4-prox with deltoid, bicep to 4 distally. LE's 1 with LEFt HF (pain component), RIGHT HF 3. KE 2- left, 2+ right, ankles 3/5. Decreased LT at both feet to about the malleoli, no ptosis, no dsyarthria, CN exam intact  Skin: stable gluteal/perineal/waist line with a few patches of healing round lesions. Appear to be resolving herpetic lesions.  Marland Kitchen Psychiatric: She has a normal mood and affect. Her  behavior is normal. Judgment and thought content normal      Assessment/Plan: 1. Functional deficits secondary to Guillain Barre Syndrome which require 3+ hours per day of interdisciplinary therapy in a comprehensive inpatient rehab setting. Physiatrist is providing close team supervision and 24 hour management of active medical problems listed below. Physiatrist and rehab team continue to assess barriers to discharge/monitor patient progress toward functional and medical goals. FIM: FIM - Bathing Bathing Steps Patient Completed: Chest;Right Arm;Left Arm;Abdomen;Front perineal area;Right upper leg;Left upper leg Bathing: 1: Total-Patient completes 0-2 of 10 parts or less than 25%  FIM - Upper Body Dressing/Undressing Upper body dressing/undressing steps patient completed: Thread/unthread right sleeve of pullover shirt/dresss;Thread/unthread left sleeve of pullover shirt/dress;Put head through opening of pull over shirt/dress;Pull shirt over trunk Upper body dressing/undressing: 4: Min-Patient completed 75 plus % of tasks FIM - Lower Body Dressing/Undressing Lower body dressing/undressing steps patient completed: Don/Doff right sock Lower body dressing/undressing: 0: Wears gown/pajamas-no public clothing  FIM - Toileting Toileting steps completed by patient: Performs perineal hygiene Toileting Assistive Devices: Grab bar or rail for support Toileting: 3: Mod-Patient completed 2 of 3 steps  FIM - Radio producer Devices: Elevated toilet seat Toilet Transfers: 3-To toilet/BSC: Mod A (lift or lower assist);3-From toilet/BSC: Mod A (lift or lower assist)  FIM - Bed/Chair Transfer Bed/Chair Transfer Assistive Devices: Bed rails;Arm rests;Walker Bed/Chair Transfer: 2: Supine > Sit: Max A (lifting assist/Pt. 25-49%);2: Sit > Supine: Max A (  lifting assist/Pt. 25-49%);2: Bed > Chair or W/C: Max A (lift and lower assist);1: Chair or W/C > Bed: Total A (helper does  all/Pt. < 25%)  FIM - Locomotion: Wheelchair Locomotion: Wheelchair: 1: Travels less than 50 ft with minimal assistance (Pt.>75%) FIM - Locomotion: Ambulation Locomotion: Ambulation Assistive Devices: Administrator Ambulation/Gait Assistance: 1: +2 Total assist Locomotion: Ambulation: 1: Two helpers  Comprehension Comprehension Mode: Auditory Comprehension: 6-Follows complex conversation/direction: With extra time/assistive device  Expression Expression Mode: Verbal Expression: 5-Expresses complex 90% of the time/cues < 10% of the time  Social Interaction Social Interaction: 5-Interacts appropriately 90% of the time - Needs monitoring or encouragement for participation or interaction.  Problem Solving Problem Solving: 3-Solves basic 50 - 74% of the time/requires cueing 25 - 49% of the time  Memory Memory: 5-Recognizes or recalls 90% of the time/requires cueing < 10% of the time Medical Problem List and Plan:  1. Suspect GBS. (? Myasthenia gravis). IVIG completed. Continue CellCept  2. DVT Prophylaxis/Anticoagulation: History of DVT with IVC filter  3. Pain Management: Hydrocodone as needed. Monitor with increased mobility  -added lidoderm patch for left low back/hip pain---suspect pain is from lumbar spondylosis/referral  3. Mood/anxiety/depression: Wellbutrin 150 mg daily, Xanax 0.25 mg twice daily as needed and 1 mg each bedtime as needed  4. Neuropsych: This patient is capable of making decisions on her own behalf.  5. Hypothyroidism. Synthroid  6. Hyperlipidemia. Lipitor  7. Chronic anemia/thrombocytopenia. Latest hemoglobin 8.6 and platelet count 84,000 improved from 67,000.   iron supplement held.  followup CBC  8. Sacral wounds. Appreciate WOC input and recs -  likely herpetic lesions/ HSV  -by their appearance it's really too late to treat.  -air mattress  9. Chronic loose stool: probiotic  -started imodium scheduled  -continue rectal tube  -stopped iron and  multivit due to nausea  -c diff negative  -continue IVF for now  -add megace for appetite   LOS (Days) 7 A FACE TO FACE EVALUATION WAS PERFORMED  Lauris Serviss T 06/26/2013 8:01 AM

## 2013-06-26 NOTE — Progress Notes (Signed)
Occupational Therapy Session Note  Patient Details  Name: [name] MRN: [id number] Date of Birth: 13-Dec-1924  Today's Date: 06/26/2013 Time: 0832-0930 Time Calculation (min): 58 min  Short Term Goals: Week 1:  OT Short Term Goal 1 (Week 1): Patient will complete upper body bathing and dressing with min assist OT Short Term Goal 2 (Week 1): Patient will complete lower body bathing and dressing with max assist OT Short Term Goal 3 (Week 1): Patient will complete toilet transfer with mod assist OT Short Term Goal 4 (Week 1): Patient will demo ability to perform sit <> stand during ADL to pull-up underwear and/or pants with mod assist  Skilled Therapeutic Interventions/Progress Updates:    Pt seen for ADL retraining with focus on sitting balance, functional transfers, and sit<>stand. Pt received supine in bed requiring mod cues for encouragement to participate in therapy this AM. Max assist for supine>sit with min-supervision sitting EOB unsupported. Pt completed stand pivot transfer bed>w/c with max assist and mod cueing. Pt completed bathing and dressing at sink declining UB dressing and bathing. Pt required mod assist for sit<>stand with mod assist requiring assist for buttocks hygiene and clothing management around waist. Pt required frequent rest breaks d/t fatigue and c/o dizziness. Completed self-feeding with focus on nutritional intake with mod cueing to try food. At end of session pt left sitting in tilt in space w/c with all needs in reach.   Therapy Documentation Precautions:  Precautions Precautions: Fall Restrictions Weight Bearing Restrictions: No General:   Vital Signs:   Pain: Pt with report of pain in LLE.   See FIM for current functional status  Therapy/Group: Individual Therapy  Duayne Cal 06/26/2013, 10:41 AM

## 2013-06-26 NOTE — Plan of Care (Signed)
Problem: RH BOWEL ELIMINATION Goal: RH STG MANAGE BOWEL WITH ASSISTANCE STG Manage Bowel with Mod I Assistance.  Outcome: Not Progressing Flex seal in place for diarrhea, total assist of staff to manage

## 2013-06-26 NOTE — Plan of Care (Signed)
Problem: RH BOWEL ELIMINATION Goal: RH STG MANAGE BOWEL WITH ASSISTANCE STG Manage Bowel with Max Assistance.  Outcome: Not Progressing Total assist. Flexiseal in use.

## 2013-06-27 ENCOUNTER — Inpatient Hospital Stay (HOSPITAL_COMMUNITY): Payer: Medicare Other | Admitting: Physical Therapy

## 2013-06-27 ENCOUNTER — Inpatient Hospital Stay (HOSPITAL_COMMUNITY): Payer: Medicare Other | Admitting: Occupational Therapy

## 2013-06-27 ENCOUNTER — Inpatient Hospital Stay (HOSPITAL_COMMUNITY): Payer: Medicare Other

## 2013-06-27 LAB — BASIC METABOLIC PANEL
BUN: 36 mg/dL — ABNORMAL HIGH (ref 6–23)
CALCIUM: 7 mg/dL — AB (ref 8.4–10.5)
CO2: 16 mEq/L — ABNORMAL LOW (ref 19–32)
Chloride: 106 mEq/L (ref 96–112)
Creatinine, Ser: 1.39 mg/dL — ABNORMAL HIGH (ref 0.50–1.10)
GFR calc Af Amer: 38 mL/min — ABNORMAL LOW (ref >90)
GFR, EST NON AFRICAN AMERICAN: 33 mL/min — AB (ref >90)
Glucose, Bld: 93 mg/dL (ref 70–99)
POTASSIUM: 4.3 meq/L (ref 3.7–5.3)
SODIUM: 134 meq/L — AB (ref 137–147)

## 2013-06-27 MED ORDER — CALCIUM POLYCARBOPHIL 625 MG PO TABS
625.0000 mg | ORAL_TABLET | Freq: Two times a day (BID) | ORAL | Status: DC
  Administered 2013-06-27 – 2013-07-04 (×15): 625 mg via ORAL
  Administered 2013-07-04: 22:00:00 via ORAL
  Administered 2013-07-05 – 2013-07-06 (×3): 625 mg via ORAL
  Filled 2013-06-27 (×23): qty 1

## 2013-06-27 MED ORDER — CHOLESTYRAMINE 4 G PO PACK
4.0000 g | PACK | Freq: Two times a day (BID) | ORAL | Status: DC
  Administered 2013-06-27 – 2013-07-01 (×7): 4 g via ORAL
  Filled 2013-06-27 (×12): qty 1

## 2013-06-27 MED ORDER — CHOLESTYRAMINE 4 G PO PACK: 4.0000 g | PACK | Freq: Two times a day (BID) | ORAL | Status: DC

## 2013-06-27 MED ORDER — BOOST / RESOURCE BREEZE PO LIQD
1.0000 | Freq: Two times a day (BID) | ORAL | Status: DC
  Administered 2013-06-28 – 2013-07-04 (×7): 1 via ORAL

## 2013-06-27 MED ORDER — LOPERAMIDE HCL 2 MG PO CAPS
4.0000 mg | ORAL_CAPSULE | Freq: Four times a day (QID) | ORAL | Status: DC
  Filled 2013-06-27 (×4): qty 2

## 2013-06-27 MED ORDER — LOPERAMIDE HCL 2 MG PO CAPS
4.0000 mg | ORAL_CAPSULE | Freq: Three times a day (TID) | ORAL | Status: DC
  Administered 2013-06-27 – 2013-06-30 (×9): 4 mg via ORAL
  Filled 2013-06-27 (×12): qty 2

## 2013-06-27 NOTE — Patient Care Conference (Signed)
Inpatient RehabilitationTeam Conference and Plan of Care Update Date: 06/25/2013   Time: 2:30 PM    Patient Name: [name]      Medical Record Number: [id number]  Date of Birth: [birth year] Sex: Female         Room/Bed: 4M08C/4M08C-01 Payor Info: Payor: MEDICARE / Plan: MEDICARE PART A AND B / Product Type: *No Product type* /    Admitting Diagnosis: MG  Admit Date/Time:  06/19/2013  3:07 PM Admission Comments: No comment available   Primary Diagnosis:  GBS (Guillain-Barre syndrome) Principal Problem: GBS (Guillain-Barre syndrome)  Patient Active Problem List   Diagnosis Date Noted  . GBS (Guillain-Barre syndrome) 06/19/2013  . Weakness 06/11/2013  . Proximal muscle weakness 06/11/2013  . Numbness of legs 06/11/2013  . Cardiomyopathy 04/22/2013  . Physical deconditioning 04/02/2013  . Pedal edema 04/01/2013  . Chronic systolic heart failure 40/98/1191  . Pulmonary hypertension 03/27/2013  . Hypokalemia 03/27/2013  . SBO (small bowel obstruction) 03/25/2013  . Ulcerative colitis   . Protein-calorie malnutrition, severe 03/24/2013  . Anemia of chronic disease 03/23/2013  . Acute respiratory failure with hypoxia 03/23/2013  . Atrial fibrillation with RVR 03/22/2013  . N&V (nausea and vomiting) 03/22/2013  . 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 Incisional hernia, incarcerated s/p lap repair 03/25/2013 03/22/2013  . NSTEMI (non-ST elevated myocardial infarction) 03/22/2013  . Acute renal failure 12/07/2012  . ANXIETY 12/26/2007  . HYPOTHYROIDISM 11/10/2006    Expected Discharge Date: Expected Discharge Date:  (2 weeks vs SNF)  Team Members Present: Physician leading conference: Dr. Alger Simons Social Worker Present: Lennart Pall, LCSW Nurse Present: Janyth Pupa, RN PT Present: Canary Brim, Rayburn Ma, PT OT Present: Chrys Racer, Olena Leatherwood, OT PPS Coordinator present : Daiva Nakayama, RN, CRRN;Becky Alwyn Ren, PT     Current  Status/Progress Goal Weekly Team Focus  Medical   guillain barre, generalized weakness. continued GI issues, wound care  improve exercise tolerance,   diarrhea, nausea mgt   Bowel/Bladder   Foley tube with black, liquid stool. LBM 06/24/13  Managed bowel programa  Assess effectiveness of Immodium, proboitic medication   Swallow/Nutrition/ Hydration             ADL's   Max A for B & D and transfers  Mod A bathing, Min A dressing, Min A transfers and dynamic standing balance, Supervision for dynamic sitting balance  Endurance, transfers, sit <> stand, dynamic sitting/standing balance   Mobility   Inconsistent, max /+2 assist  Min assist transfers, mod assist ambulation  OOB tolerance, transfers, pressure relief, sit <> stands, activity tolerance   Communication             Safety/Cognition/ Behavioral Observations            Pain   No c/o pain  <3  Monitor for nonverbal indicators of pain   Skin   Stage 3 to sacrum x 2 with allevyn dressing intact. Skin tear to labia, EPBC and allevyn dressing applied for protection  No additional skin breakdown  Routine turn q 2hrs     Rehab Goals Patient on target to meet rehab goals: Yes *See Care Plan and progress notes for long and short-term goals.  Barriers to Discharge: profound weakness, ongoing diarrhea, wound care    Possible Resolutions to Barriers:  full time supervision and assistance at home    Discharge Planning/Teaching Needs:  home with family providing 24/7 assistance vs possible SNF      Team Discussion:  Syncopal episode today - starting IVF.  Currently max - mod assist overall with min/ mod assist goals.   Concern that family may not be able to meet these needs - SW to follow up.  Also anticipate will need pressure relief education close management at home.  Revisions to Treatment Plan:  None   Continued Need for Acute Rehabilitation Level of Care: The patient requires daily medical management by a physician with  specialized training in physical medicine and rehabilitation for the following conditions: Daily direction of a multidisciplinary physical rehabilitation program to ensure safe treatment while eliciting the highest outcome that is of practical value to the patient.: Yes Daily medical management of patient stability for increased activity during participation in an intensive rehabilitation regime.: Yes Daily analysis of laboratory values and/or radiology reports with any subsequent need for medication adjustment of medical intervention for : Neurological problems;Other  [name], [place] 06/27/2013, 10:47 AM

## 2013-06-27 NOTE — Progress Notes (Signed)
NUTRITION FOLLOW UP  Intervention:   1.  Supplements; Resource Breeze po TID, each supplement provides 250 kcal and 9 grams of protein 2.  Continue MVI daily. 3.  General healthful diet; encourage intake of foods and beverages as able.  RD to follow and assess for nutritional adequacy.   Nutrition Dx:   Predicted suboptimal intake, discontinued New Nutrition Dx: Inadequate oral intake related to poor appetite AEB PO 50% of meal, wt loss  Monitor:   1.  Food/Beverage; pt meeting >/=90% estimated needs with tolerance.  Not met.  Interventions in place. 2.  Wt/wt change; monitor trends  Ongoing.   Assessment:   Patient is an 80 y.o. Female with PMH of myasthenia gravis on cellcept, uterine cancer, DVT s/p IVC filter because of GIB, hypothyroidism, NICM, PAF, anxiety, anemia, and vitamin B12 deficiency. Patient was admitted to [facility] ED on 1/20 complaining of progressive muscle weakness over the previous 3 days. Patient was transferred to [facility] later on 1/20. Work-up revealed myasthenia gravis causing lower extremity fatigability versus possible GBS. Pt admitted to inpatient rehab on 1/28.  Pt with several wounds to sacrum and frequent watery diarrhea.  Now with flexiseal.  PO intake 0-55% of meals. Ensure Complete has been discontinued. Will order Breeze for pt.  Pt started Megace yesterday.   Height: Ht Readings from Last 1 Encounters:  06/19/13 _0  (1.626 m)    Weight Status:   Wt Readings from Last 1 Encounters:  06/26/13 141 lb 12.1 oz (64.3 kg)    Re-estimated needs:  Kcal: 1700-1900 Protein: 85-95g Fluid: ~2.0 L/day  Skin: Stage 2 pressure ulcer, generalized edema  Diet Order: Dysphagia 2, thin   Intake/Output Summary (Last 24 hours) at 06/27/13 0910 Last data filed at 06/26/13 1447  Gross per 24 hour  Intake    120 ml  Output    600 ml  Net   -480 ml    Last BM: 2/4, diarrhea via flexiseal   Labs:   Recent Labs Lab 06/24/13 0454  06/26/13 0435 06/27/13 0515  NA 135* 137 134*  K 4.1 4.7 4.3  CL 106 108 106  CO2 19 17* 16*  BUN 23 34* 36*  CREATININE 1.16* 1.45* 1.39*  CALCIUM 7.3* 7.2* 7.0*  GLUCOSE 78 89 93    CBG (last 3)  No results found for this basename: GLUCAP,  in the last 72 hours  Scheduled Meds: . antiseptic oral rinse  15 mL Mouth Rinse BID  . aspirin  81 mg Oral Daily  . atorvastatin  20 mg Oral Q supper  . buPROPion  150 mg Oral Q breakfast  . cholestyramine  4 g Oral BID  . lactobacillus  1 g Oral TID WC  . lidocaine  1 patch Transdermal Q24H  . loperamide  4 mg Oral QID  . megestrol  400 mg Oral BID  . mycophenolate  1,000 mg Oral BID  . pantoprazole  40 mg Oral Daily  . polycarbophil  625 mg Oral BID  . potassium chloride  20 mEq Oral BID  . prochlorperazine  10 mg Oral TID    Continuous Infusions: . sodium chloride 75 mL/hr at 06/26/13 1752    Brynda Greathouse, MS RD LDN Clinical Inpatient Dietitian Pager: [phone] Weekend/After hours pager: [phone]

## 2013-06-27 NOTE — Progress Notes (Signed)
Occupational Therapy Session Note  Patient Details  Name: [name] MRN: [id number] Date of Birth: 02-21-1925  Today's Date: 06/27/2013 Time: 1300-1330 Time Calculation (min): 30 min  Short Term Goals: Week 1:  OT Short Term Goal 1 (Week 1): Patient will complete upper body bathing and dressing with min assist OT Short Term Goal 2 (Week 1): Patient will complete lower body bathing and dressing with max assist OT Short Term Goal 3 (Week 1): Patient will complete toilet transfer with mod assist OT Short Term Goal 4 (Week 1): Patient will demo ability to perform sit <> stand during ADL to pull-up underwear and/or pants with mod assist  Skilled Therapeutic Interventions/Progress Updates:  Upon entering room, patient found supine in bed. BP=105/72. Patient sat edge of bed with moderate assistance. From here, BP=76/45, therapist notified RN and RN stated PA said patient to participate as tolerated. BP checked a few times after this and increased >89/61. Patient transferred > w/c with max assist from therapist. Tilted patient in w/c and left patient seated in w/c with bilateral leg rests donned and NT in room to assist with urinal.   Precautions:  Precautions Precautions: Fall Restrictions Weight Bearing Restrictions: No  See FIM for current functional status  Therapy/Group: Individual Therapy  Chou Busler 06/27/2013, 2:45 PM

## 2013-06-27 NOTE — Progress Notes (Signed)
Physical Therapy Weekly Progress Note  Patient Details  Name: [name] MRN: [id number] Date of Birth: [birth year]  Today's Date: 06/27/2013 Time: 1000-1100 Time Calculation (min): 60 min  Patient has partially met 1 of 4 short term goals.  Pt has not been progressing as well as anticipated due to medical complication: more severe than normal diarrhea, fatigue, and dizziness. Pt verbalizes room spinning dizziness whenever she is not supine, worsens with position changes and movement. Pt also reports that separate from this "room spinning" dizziness she also gets lightheaded at times (but not as consistently as vertigo). She currently requires max to +2 total assist for sit <> stands and transfers. She has not been able to participate in functional gait training yet. The team is working hard to keep her wounds from progressing through use of air mattress on her bed, tilt-in-space chair with pressure relief cushion during time between therapy sessions so that nursing can perform pressure relief, and nursing has been working hard to keep wound clean.   Patient continues to demonstrate the following deficits: very poor out of bed tolerance, vertigo, orthostasis, posterior lean in standing, decreased functional mobility as compared to baseline, difficulty with pressure relief and therefore will continue to benefit from skilled PT intervention to enhance overall performance with activity tolerance, balance, postural control and ability to compensate for deficits.  Patient not progressing well toward long term goals.  See goal revision..  Plan of care revisions: goals downgraded.  PT Short Term Goals Week 1:  PT Short Term Goal 1 (Week 1): Pt will transfer bed<> wheelchair with mod assist PT Short Term Goal 1 - Progress (Week 1): Not met PT Short Term Goal 2 (Week 1): Pt will sit <> stand with mod assist PT Short Term Goal 2 - Progress (Week 1): Not met PT Short Term Goal 3 (Week 1): Pt will propel  w/c x 25' with mod assist PT Short Term Goal 3 - Progress (Week 1): Not met PT Short Term Goal 4 (Week 1): Pt will verbalize understanding of mechanism for pressure relief and frequency for improved skin integrity  PT Short Term Goal 4 - Progress (Week 1): Partly met  Skilled Therapeutic Interventions/Progress Updates:    Pt reclined in tilt-in-space chair upon entry, reports she is feeling a little better but still having "room spinning dizziness." No definite nystagmus seen today however pt tends to keep her eyes closed. Sit <> stands x 5 bouts with mod/max assist and static standing balance x 30-45 sec each pt then requires seated rest due to dizziness. Pt then also began reporting lightheadedness - see BP below. RN contacted who spoke with PA who advised activity to tolerance, pt able to perform one more stand then requested back to bed. Stand pivot transfer with +2 for safety, max assist overall for posterior lean, PT managing RW. Sit >supine +2 assist for trunk and bil LEs. Pt reports relief of vertigo and lightheadedness when supine in bed. Rest of session focused on supine therex for bil. LEs: manually resisted ankle plantarflexion/dorsiflexion with passive overstretch into dorsiflexion; hip/knee flexion/extension manually resisted 3 x 10 reps each.   Nausea improved today.  Orthostatic BPs  Sitting start of session, reclined 105/69  Sitting after 3 bouts of standing 85/56  Supine 123/79    Therapy Documentation Precautions:  Precautions Precautions: Fall Restrictions Weight Bearing Restrictions: No Pain:  no c/o  See FIM for current functional status  Therapy/Group: Individual Therapy  Lahoma Rocker 06/27/2013, 11:40 AM

## 2013-06-27 NOTE — Progress Notes (Signed)
Called to room by therapist, patient's blood pressure sitting on side of bed, with TED hose in place 76/45, up to 89/61 sitting in tilted wheelchair, patient denies dizziness, working with OT therapist Mardene Celeste. Mar[place]e [place], Pa made aware of above, no new orders given at this time. [place], Atul Delucia Selinda Eon

## 2013-06-27 NOTE — Progress Notes (Signed)
Occupational Therapy Session Note  Patient Details  Name: [name] MRN: [id number] Date of Birth: 08/25/1924  Today's Date: 06/27/2013 Time: 0930-1030 Time Calculation (min): 60 min  Short Term Goals: Week 1:  OT Short Term Goal 1 (Week 1): Patient will complete upper body bathing and dressing with min assist OT Short Term Goal 2 (Week 1): Patient will complete lower body bathing and dressing with max assist OT Short Term Goal 3 (Week 1): Patient will complete toilet transfer with mod assist OT Short Term Goal 4 (Week 1): Patient will demo ability to perform sit <> stand during ADL to pull-up underwear and/or pants with mod assist  Skilled Therapeutic Interventions/Progress Updates: ADL-retraining with focus on improved activity tolerance, bed mobility, transfer, sit <> stand, and dynamic standing balance.   Patient received supine in bed, alert and receptive to planned BADL activity.   With extra time, patient rolled to her left, grasped bed rail and completed bed mobility to max assist to manage bil LE.  Patient was able to maintain sitting balance at edge of bed, using bed rail, prn, due to mild episode of dizziness (resolved after several minutes).   Patient complete stand-pivot transfer to tilting w/c with max assist (pt = 40+%) and progressed through bathing & dressing task, at w/c level sink side.   After total assist to wash and don TED at LE, patient completed upper body bathing and grooming with only setup assist and extra time.  Patient stood as cued to wash peri-area, with OT providing mod assist to lift for standing and to stabilize hips, while patient supported her self with left arm on countertop while washing, approx 45 seconds.  OT provided assist to wash buttocks and to recover from standing to sitting (pt = 60%).   Patient participated for entire 60 minute session, with 4 small rest breaks to proceed.  Patient left in tilting w/c, reclined, call light within reach.     Therapy Documentation Precautions:  Precautions Precautions: Fall Restrictions Weight Bearing Restrictions: No  Pain: Pain Assessment Pain Assessment: 0-10 Pain Score: 0-No pain Patients Stated Pain Goal: 3 Multiple Pain Sites: No  ADL: ADL ADL Comments: see FIM  See FIM for current functional status  Therapy/Group: Individual Therapy  Jamariya Davidoff 06/27/2013, 11:58 AM

## 2013-06-27 NOTE — Progress Notes (Signed)
Called to patient's room by therapy, patient complained of dizziness, light headed after standing a couple of times with therapists, patient's blood pressure dropped form 105/69 sitting to 85/56 sitting patient placed back in bed, patient's TEDS in place, re check of blood pressure lying in bed 100/60, once lying then dizzy resolved, spoke to [employer], PA, made aware of above information no new orders given at this time. [place], [name]

## 2013-06-27 NOTE — Plan of Care (Signed)
Problem: RH BLADDER ELIMINATION Goal: RH STG MANAGE BLADDER WITH ASSISTANCE STG Manage Bladder With Mod I Assistance.  Outcome: Not Progressing Incontinent x 1 today

## 2013-06-27 NOTE — Progress Notes (Signed)
Subjective/Complaints: Had a better day overall. Still with poor appetite. Rectal tube remains in place for watery stool  A 12 point review of systems has been performed and if not noted above is otherwise negative.   Objective: Vital Signs: Blood pressure 105/71, pulse 74, temperature 97.8 F (36.6 C), temperature source Oral, resp. rate 19, height 5\' 4"  (1.626 m), weight 64.3 kg (141 lb 12.1 oz), SpO2 99.00%. No results found.  Recent Labs  06/26/13 0435  WBC 3.8*  HGB 9.3*  HCT 27.7*  PLT 79*    Recent Labs  06/26/13 0435 06/27/13 0515  NA 137 134*  K 4.7 4.3  CL 108 106  GLUCOSE 89 93  BUN 34* 36*  CREATININE 1.45* 1.39*  CALCIUM 7.2* 7.0*   CBG (last 3)  No results found for this basename: GLUCAP,  in the last 72 hours  Wt Readings from Last 3 Encounters:  06/26/13 64.3 kg (141 lb 12.1 oz)  06/17/13 66.9 kg (147 lb 7.8 oz)  05/31/13 70.761 kg (156 lb)    Physical Exam:  Constitutional: She is oriented to person, place. Looks a bit brighter today Frail 80 year old female.  HENT:  Head: Normocephalic.  Eyes: EOM are normal.  Neck: Normal range of motion. Neck supple. No thyromegaly present.  Cardiovascular: Normal rate and regular rhythm. No murmurs. No gallops  Respiratory: Effort normal and breath sounds normal. No respiratory distress. No wheezes, rales  GI: Soft. Bowel sounds are normal. She exhibits no distension. Rectal tube still with soft, liquid stool Neurological: She is alert and oriented to place. Slow to arouse this am.follows all simple commands  Follows simple commands. UE 4-prox with deltoid, bicep to 4 distally. LE's 1 with LEFt HF (pain component), RIGHT HF 3. KE 2- left, 2+ right, ankles 3/5. Decreased LT at both feet to about the malleoli, no ptosis, no dsyarthria, CN exam intact  Skin: stable gluteal/perineal/waist line with a few patches of healing round lesions. Appear to be resolving herpetic lesions.  Marland Kitchen Psychiatric: She has a  normal mood and affect. Her behavior is normal. Judgment and thought content normal      Assessment/Plan: 1. Functional deficits secondary to Guillain Barre Syndrome which require 3+ hours per day of interdisciplinary therapy in a comprehensive inpatient rehab setting. Physiatrist is providing close team supervision and 24 hour management of active medical problems listed below. Physiatrist and rehab team continue to assess barriers to discharge/monitor patient progress toward functional and medical goals. FIM: FIM - Bathing Bathing Steps Patient Completed: Right upper leg;Left upper leg (declined washing UB) Bathing: 2: Max-Patient completes 3-4 32f 10 parts or 25-49%  FIM - Upper Body Dressing/Undressing Upper body dressing/undressing steps patient completed: Thread/unthread right sleeve of pullover shirt/dresss;Thread/unthread left sleeve of pullover shirt/dress;Put head through opening of pull over shirt/dress;Pull shirt over trunk Upper body dressing/undressing: 0: Wears gown/pajamas-no public clothing FIM - Lower Body Dressing/Undressing Lower body dressing/undressing steps patient completed: Don/Doff right sock Lower body dressing/undressing: 1: Total-Patient completed less than 25% of tasks  FIM - Toileting Toileting steps completed by patient: Performs perineal hygiene Toileting Assistive Devices: Grab bar or rail for support Toileting: 3: Mod-Patient completed 2 of 3 steps  FIM - Radio producer Devices: Elevated toilet seat Toilet Transfers: 3-To toilet/BSC: Mod A (lift or lower assist);3-From toilet/BSC: Mod A (lift or lower assist)  FIM - Bed/Chair Transfer Bed/Chair Transfer Assistive Devices: Bed rails;Arm rests;Walker Bed/Chair Transfer: 2: Supine > Sit: Max A (lifting assist/Pt. 25-49%);2:  Bed > Chair or W/C: Max A (lift and lower assist)  FIM - Locomotion: Wheelchair Locomotion: Wheelchair: 1: Travels less than 50 ft with minimal  assistance (Pt.>75%) FIM - Locomotion: Ambulation Locomotion: Ambulation Assistive Devices: Administrator Ambulation/Gait Assistance: 1: +2 Total assist Locomotion: Ambulation: 1: Two helpers  Comprehension Comprehension Mode: Auditory Comprehension: 6-Follows complex conversation/direction: With extra time/assistive device  Expression Expression Mode: Verbal Expression: 5-Expresses complex 90% of the time/cues < 10% of the time  Social Interaction Social Interaction: 5-Interacts appropriately 90% of the time - Needs monitoring or encouragement for participation or interaction.  Problem Solving Problem Solving: 3-Solves basic 50 - 74% of the time/requires cueing 25 - 49% of the time  Memory Memory: 5-Recognizes or recalls 90% of the time/requires cueing < 10% of the time Medical Problem List and Plan:  1. Suspect GBS. (? Myasthenia gravis). IVIG completed. Continue CellCept  2. DVT Prophylaxis/Anticoagulation: History of DVT with IVC filter  3. Pain Management: Hydrocodone as needed. Monitor with increased mobility  -added lidoderm patch for left low back/hip pain---suspect pain is from lumbar spondylosis/referral  3. Mood/anxiety/depression: Wellbutrin 150 mg daily, Xanax 0.25 mg twice daily as needed and 1 mg each bedtime as needed  4. Neuropsych: This patient is capable of making decisions on her own behalf.  5. Hypothyroidism. Synthroid  6. Hyperlipidemia. Lipitor  7. Chronic anemia/thrombocytopenia. Latest hemoglobin 8.6 and platelet count 84,000 improved from 67,000.   iron supplement held.  followup CBC  8. Sacral wounds. Appreciate WOC input and recs -  likely herpetic lesions/ HSV  -by their appearance it's really too late to treat.  -air mattress  9. Chronic loose stool: more severe during this hospitalization   -probiotic  -started imodium scheduled, increased to qid  -continue rectal tube  -stopped iron and multivit due to nausea  -c diff negative  -added  fiber, try questran also  -will request GI consult as I am not sure what else to add at this point  -cellcept also likely contributing   10. FEN  -continue IVF for now            -added megace for appetite   LOS (Days) 8 A FACE TO FACE EVALUATION WAS PERFORMED  Tommaso Cavitt T 06/27/2013 8:22 AM

## 2013-06-27 NOTE — Progress Notes (Signed)
And contacts made the patient's gastroenterologist/[place](Dr. Carlean Purl) spoke to his [place] [phone] with recommendations to begin Questran twice a day and monitor for improvement in persistent stooling. If no improvement GI to followup

## 2013-06-27 NOTE — Progress Notes (Signed)
Social Work Patient ID: [name], female   DOB: 1925-03-04, 80 y.o.   MRN: [id number]  Lennart Pall, LCSW Social Worker Signed  Patient Care Conference Service date: 06/27/2013 10:47 AM  Inpatient RehabilitationTeam Conference and Plan of Care Update Date: 06/25/2013   Time: 2:30 PM     Patient Name: [name]       Medical Record Number: [id number]   Date of Birth: Apr 27, 1925 Sex: Female         Room/Bed: 4M08C/4M08C-01 Payor Info: Payor: MEDICARE / Plan: MEDICARE PART A AND B / Product Type: *No Product type* /   Admitting Diagnosis: MG   Admit Date/Time:  06/19/2013  3:07 PM Admission Comments: No comment available   Primary Diagnosis:  GBS (Guillain-Barre syndrome) Principal Problem: GBS (Guillain-Barre syndrome)    Patient Active Problem List     Diagnosis  Date Noted   .  GBS (Guillain-Barre syndrome)  06/19/2013   .  Weakness  06/11/2013   .  Proximal muscle weakness  06/11/2013   .  Numbness of legs  06/11/2013   .  Cardiomyopathy  04/22/2013   .  Physical deconditioning  04/02/2013   .  Pedal edema  04/01/2013   .  Chronic systolic heart failure  78/58/8502   .  Pulmonary hypertension  03/27/2013   .  Hypokalemia  03/27/2013   .  SBO (small bowel obstruction)  03/25/2013   .  Ulcerative colitis     .  Protein-calorie malnutrition, severe  03/24/2013   .  Anemia of chronic disease  03/23/2013   .  Acute respiratory failure with hypoxia  03/23/2013   .  Atrial fibrillation with RVR  03/22/2013   .  N&V (nausea and vomiting)  03/22/2013   .  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  Incisional hernia, incarcerated s/p lap repair 03/25/2013  03/22/2013   .  NSTEMI (non-ST elevated myocardial infarction)  03/22/2013   .  Acute renal failure  12/07/2012   .  ANXIETY  12/26/2007   .  HYPOTHYROIDISM  11/10/2006     Expected Discharge Date: Expected Discharge Date:  (2 weeks vs SNF)  Team Members Present: Physician leading conference: Dr.  Alger Simons Social Worker Present: Lennart Pall, LCSW Nurse Present: Janyth Pupa, RN PT Present: Canary Brim, Rayburn Ma, PT OT Present: Chrys Racer, Olena Leatherwood, OT PPS Coordinator present : Daiva Nakayama, RN, CRRN;Becky Alwyn Ren, PT        Current Status/Progress  Goal  Weekly Team Focus   Medical     guillain barre, generalized weakness. continued GI issues, wound care  improve exercise tolerance,   diarrhea, nausea mgt   Bowel/Bladder     Foley tube with black, liquid stool. LBM 06/24/13  Managed bowel programa  Assess effectiveness of Immodium, proboitic medication   Swallow/Nutrition/ Hydration            ADL's     Max A for B & D and transfers  Mod A bathing, Min A dressing, Min A transfers and dynamic standing balance, Supervision for dynamic sitting balance  Endurance, transfers, sit <> stand, dynamic sitting/standing balance   Mobility     Inconsistent, max /+2 assist  Min assist transfers, mod assist ambulation  OOB tolerance, transfers, pressure relief, sit <> stands, activity tolerance   Communication            Safety/Cognition/ Behavioral Observations           Pain  No c/o pain  <3  Monitor for nonverbal indicators of pain   Skin     Stage 3 to sacrum x 2 with allevyn dressing intact. Skin tear to labia, EPBC and allevyn dressing applied for protection  No additional skin breakdown  Routine turn q 2hrs     Rehab Goals Patient on target to meet rehab goals: Yes *See Care Plan and progress notes for long and short-term goals.    Barriers to Discharge:  profound weakness, ongoing diarrhea, wound care      Possible Resolutions to Barriers:    full time supervision and assistance at home      Discharge Planning/Teaching Needs:    home with family providing 24/7 assistance vs possible SNF      Team Discussion:    Syncopal episode today - starting IVF.  Currently max - mod assist overall with min/ mod assist goals.   Concern that family  may not be able to meet these needs - SW to follow up.  Also anticipate will need pressure relief education close management at home.   Revisions to Treatment Plan:    None    Continued Need for Acute Rehabilitation Level of Care: The patient requires daily medical management by a physician with specialized training in physical medicine and rehabilitation for the following conditions: Daily direction of a multidisciplinary physical rehabilitation program to ensure safe treatment while eliciting the highest outcome that is of practical value to the patient.: Yes Daily medical management of patient stability for increased activity during participation in an intensive rehabilitation regime.: Yes Daily analysis of laboratory values and/or radiology reports with any subsequent need for medication adjustment of medical intervention for : Neurological problems;Other  [name], [place] 06/27/2013, 10:47 AM

## 2013-06-27 NOTE — Progress Notes (Signed)
Social Work Patient ID: [name], female   DOB: 1925/03/20, 80 y.o.   MRN: [id number]  Met yesterday afternoon with pt and daughter, [name], to review team conference.  Discussed targeted goals of min/ moderate assistance and stressed that this will mean a significant change to what family was providing for her PTA and after previous CIR d/c.  Both report understanding of this.  Daughter feels pt "looks so much better" with the start of IVF and remains hopeful that she might have unexpected improvement.  Have explained that we need for them to discuss their abilities given targeted goals and let me know if they wish to keep d/c plan for home.  Will follow up with them in a couple of days.  Bengie Kaucher, LCSW

## 2013-06-27 NOTE — Progress Notes (Signed)
Physical Therapy Session Note  Patient Details  Name: [name] MRN: [id number] Date of Birth: Mar 06, 1925  Today's Date: 06/27/2013 Time: 1345-1430 Time Calculation (min): 45 min  Short Term Goals: Week 2:  PT Short Term Goal 1 (Week 2): Pt will transfer bed <> wheelchair with mod assist.  PT Short Term Goal 2 (Week 2): Pt will sit <> stand with mod assist PT Short Term Goal 3 (Week 2): Pt will tolerate 1 min continuous static standing with mod assist and RW.   Skilled Therapeutic Interventions/Progress Updates:    Pt seated in w/c at start, reports no significant change since this morning. Seated bil LE exercise for activity tolerance: marching, manually resisted knee extension, manually resisted knee flexion, toe taps 2 x 10 reps each. BP 101/62, attempted in standing however pt unable to remain standing long enough. Sit <> stands x 2 reps to RW with mod/max assist PT facilitating anterior weight shift. Stand pivot transfer with +2 for safety. Scooting to [facility] using bridging technique with +2 assist, pt = 50%.   RN requested pt back in bed for bladder scan.   Therapy Documentation Precautions:  Precautions Precautions: Fall Restrictions Weight Bearing Restrictions: No Pain:  Pt verbalizing some pain in buttocks, RN aware  See FIM for current functional status  Therapy/Group: Individual Therapy  [place] Rocker 06/27/2013, 3:46 PM

## 2013-06-28 ENCOUNTER — Inpatient Hospital Stay (HOSPITAL_COMMUNITY): Payer: Medicare Other

## 2013-06-28 ENCOUNTER — Inpatient Hospital Stay (HOSPITAL_COMMUNITY): Payer: Medicare Other | Admitting: Physical Therapy

## 2013-06-28 DIAGNOSIS — G610 Guillain-Barre syndrome: Secondary | ICD-10-CM

## 2013-06-28 NOTE — Progress Notes (Signed)
Occupational Therapy Session Note  Patient Details  Name: [name] MRN: [id number] Date of Birth: [birth year]  Today's Date: 06/28/2013 Time: 9622-2979 Time Calculation (min): 65 min  Short Term Goals: Week 2:  OT Short Term Goal 1 (Week 2): Patient will complete lower body bathing and dressing with max assist OT Short Term Goal 2 (Week 2): Patient will complete toilet transfer with mod assist OT Short Term Goal 3 (Week 2): Patient will complete upper body bathing and dressing with supervision and setup OT Short Term Goal 4 (Week 2): Patient will demo ability to perform sit <> stand during ADL to pull-up underwear and/or pants with min assist  Skilled Therapeutic Interventions/Progress Updates: ADL-retraining with focus on improved activity tolerance, transfers, sit <> stand, and dynamic sitting/standing balance.   Patient received supine in bed, alert and oriented to planned activity.   Patient reported ongoing symptoms of dizziness although with less nausea during this session.   Patient was challenged to perform bed mobility and sit at edge of bed in prep for assisted transfer to w/c.   Patient initiated bed mobility and then stated she was unable to move her legs over edge of bed although participating this session with assisted lifting of both legs (pt = 60%).   Patient required extra time to recover with change of position (supine - sit) and demo'd decline in performance of sit > stand, with evidence of increased posterior lean during transfer from bed to w/c.   After transfer to tilt-in -space w/c, BP/HR was assessed (111/74, 81 bpm), and patient requested tilt to recover (approx 3 minutes).   Patient receptive to continued ADL at sink and engages in bating and dressing above her knees but remains dependent for lower legs and feet (see FIM).    Therapy Documentation Precautions:  Precautions Precautions: Fall Restrictions Weight Bearing Restrictions: No  Pain: Pain  Assessment Pain Score: 0-No pain  ADL: ADL ADL Comments: see FIM  See FIM for current functional status  Therapy/Group: Individual Therapy  Second session: Time: 8921-1941 Time Calculation (min):  45 min  Pain Assessment: No pain  Skilled Therapeutic Interventions: Therapeutic activity with focus on improved activity tolerance, general strengthening, and improved symptom management.   Patient received supine in bed, reporting residual fatigue and dizziness from previous physical therapy session.  After brief rest break, patient was receptive to general strengthening using thera-band for upper and lower body.   Patient completed 10 reps of 6 exercises for shoulder, arms, and lower legs, with therapist providing setup and graded resistance for patient while she remained supine w/HOB elevated.   Patient required frequent rest breaks of 1 to 2 minutes between exercises and 1 additional break to consume 8 spoonful of peaches and magic cup supplement during session.   Patient reported continued dizziness during exercises but progressed by shutting her eyes while exerting and by practicing diaphragmatic breathing, as instructed, to improve respirations after exercises.   See FIM for current functional status  Therapy/Group: Individual Therapy  [place] 06/28/2013, 12:44 PM

## 2013-06-28 NOTE — Progress Notes (Signed)
Physical Therapy Session Note  Patient Details  Name: [name] MRN: [id number] Date of Birth: [birth year]  Today's Date: 06/28/2013 Time: 6659-9357 Time Calculation (min): 59 min  Short Term Goals: Week 2:  PT Short Term Goal 1 (Week 2): Pt will transfer bed <> wheelchair with mod assist.  PT Short Term Goal 2 (Week 2): Pt will sit <> stand with mod assist PT Short Term Goal 3 (Week 2): Pt will tolerate 1 min continuous static standing with mod assist and RW.   Skilled Therapeutic Interventions/Progress Updates:    Pt received semi-reclined in bed; reports dizziness prior to moving/elevating [facility] but agreeable to therapy. Vitals monitored before, throughout session; see vitals signs for detailed readings.   Session focused on LE strengthening. See below for detailed description of supine LE therex.  Pt required increased cueing, time for LLE therex as compared with RLE. Pt with lethargy throughout; eyes closed for >25% of session. Pt required frequent rest breaks throughout.Therapist departed with pt semi-reclined (HOB at 55 degrees) with 4 bed rails up, bed alarm on, RN present to administer meds, and all needs within reach. Pt reports no dizziness post-session.  Therapy Documentation Precautions:  Precautions Precautions: Fall Restrictions Weight Bearing Restrictions: No Vital Signs: Therapy Vitals Pulse Rate: 79 Resp: 15 BP: 99/64 mmHg Patient Position, if appropriate: Lying (HOB elevated 50 degrees) Pain: Pain Assessment Pain Assessment: No/denies pain Pain Score: 0-No pain Exercises:  With pt semi-reclined in bed, therapist instructed pt in the following LE therapeutic exercises: bilat heel slides (manually supported LE's) 2x10 reps per side; adductor pillow squeezes 3x5 reps (2-second holds) bilaterally; manually-resisted bilat hip abduction (hips/knees flexed to 90 degrees) x10 reps; SAQ x10 reps per side; hip abduction with knee extension (manually supported LE's) x10  reps per side; manually resisted ankle plantar/dorsiflexion x10 reps per direction, isometric gluteus max contraction x8 reps on L, x6 reps on R (to pt fatigue).   See FIM for current functional status  Therapy/Group: Individual Therapy  Kemari Mares, Malva Cogan 06/28/2013, 4:03 PM

## 2013-06-28 NOTE — Progress Notes (Signed)
Occupational Therapy Weekly Progress Note  Patient Details  Name: [name] MRN: [id number] Date of Birth: 12-08-1924  Today's Date: 06/28/2013  Patient has met 2 of 4 short term goals.  Patient experienced decline in medical status, frequent nausea and dizziness, and function during week 1 with rectal tube required to aid with BM due to complex wound care.  Patient continues to demonstrate the following deficits: Poor endurance, impaired LE strength, impaired mobility, diminished symptom management skills and therefore will continue to benefit from skilled OT intervention to enhance overall performance with BADL.  Patient progressing toward long term goals..  Continue plan of care.  OT Short Term Goals Week 1:  OT Short Term Goal 1 (Week 1): Patient will complete upper body bathing and dressing with min assist OT Short Term Goal 1 - Progress (Week 1): Met OT Short Term Goal 2 (Week 1): Patient will complete lower body bathing and dressing with max assist OT Short Term Goal 2 - Progress (Week 1): Progressing toward goal OT Short Term Goal 3 (Week 1): Patient will complete toilet transfer with mod assist OT Short Term Goal 3 - Progress (Week 1): Not progressing OT Short Term Goal 4 (Week 1): Patient will demo ability to perform sit <> stand during ADL to pull-up underwear and/or pants with mod assist OT Short Term Goal 4 - Progress (Week 1): Met Week 2:  OT Short Term Goal 1 (Week 2): Patient will complete lower body bathing and dressing with max assist OT Short Term Goal 2 (Week 2): Patient will complete toilet transfer with mod assist OT Short Term Goal 3 (Week 2): Patient will complete upper body bathing and dressing with supervision and setup OT Short Term Goal 4 (Week 2): Patient will demo ability to perform sit <> stand during ADL to pull-up underwear and/or pants with min assist  Therapy Documentation Precautions:  Precautions Precautions: Fall Restrictions Weight Bearing  Restrictions: No  Vital Signs: Therapy Vitals Temp: 98.3 F (36.8 C) Temp src: Oral Pulse Rate: 75 Resp: 19 BP: 105/70 mmHg Patient Position, if appropriate: Lying Oxygen Therapy SpO2: 98 % O2 Device: None (Room air)  Pain: No pain    ADL: ADL ADL Comments: see FIM   [name] 06/28/2013, 7:55 AM

## 2013-06-28 NOTE — Progress Notes (Signed)
Physical Therapy Session Note  Patient Details  Name: [name] MRN: [id number] Date of Birth: 07-27-1924  Today's Date: 06/28/2013 Time: 1300-1330 Time Calculation (min): 30 min  Skilled Therapeutic Interventions/Progress Updates:    Agreeable to therapy. See chart below for BP vitals. Supine>sit mod assist with HOB elevated assist needed for Lt LE and trunk. Reciprocal scooting to EOB mod assist, initially pt with posterior lean which decreased with time. C/O lightheadedness and room spinning dizziness. Pt allowed extended time sitting EOB to try to decrease dizziness. Sit <> stand with mod/max assist and PT facilitation of anterior weight shift. Pt able to stand long enough today to get BP reading. Pt very orthostatic, RN made aware. Pt requested to return to bed. Sit > supine max assist, +2 to reposition in bed.   Orthostatic BPs  Supine 101/78  Sitting 88/58  Standing 78/41  Sitting after standing 88/56     Therapy Documentation Precautions:  Precautions Precautions: Fall Restrictions Weight Bearing Restrictions: No Pain: Pain Assessment Pain Score: 0-No pain  See FIM for current functional status  Therapy/Group: Individual Therapy  Lahoma Rocker 06/28/2013, 1:40 PM

## 2013-06-28 NOTE — Progress Notes (Signed)
Subjective/Complaints: Had another reasonable day. Still not much appetite. Stool still loose  A 12 point review of systems has been performed and if not noted above is otherwise negative.   Objective: Vital Signs: Blood pressure 105/70, pulse 75, temperature 98.3 F (36.8 C), temperature source Oral, resp. rate 19, height 5\' 4"  (1.626 m), weight 64.3 kg (141 lb 12.1 oz), SpO2 98.00%. No results found.  Recent Labs  06/26/13 0435  WBC 3.8*  HGB 9.3*  HCT 27.7*  PLT 79*    Recent Labs  06/26/13 0435 06/27/13 0515  NA 137 134*  K 4.7 4.3  CL 108 106  GLUCOSE 89 93  BUN 34* 36*  CREATININE 1.45* 1.39*  CALCIUM 7.2* 7.0*   CBG (last 3)  No results found for this basename: GLUCAP,  in the last 72 hours  Wt Readings from Last 3 Encounters:  06/26/13 64.3 kg (141 lb 12.1 oz)  06/17/13 66.9 kg (147 lb 7.8 oz)  05/31/13 70.761 kg (156 lb)    Physical Exam:  Constitutional: She is oriented to person, place. Looks a bit brighter today Frail 80 year old female.  HENT:  Head: Normocephalic.  Eyes: EOM are normal.  Neck: Normal range of motion. Neck supple. No thyromegaly present.  Cardiovascular: Normal rate and regular rhythm. No murmurs. No gallops  Respiratory: Effort normal and breath sounds normal. No respiratory distress. No wheezes, rales  GI: Soft. Bowel sounds are normal. She exhibits no distension. Rectal tube still with soft, liquid stool Neurological: She is alert and oriented to place. Slow to arouse this am.follows all simple commands  Follows simple commands. UE 4-prox with deltoid, bicep to 4 distally. LE's 1 with LEFt HF (pain component), RIGHT HF 3. KE 2- left, 2+ right, ankles 3/5. Decreased LT at both feet to about the malleoli, no ptosis, no dsyarthria, CN exam intact  Skin: stable gluteal/perineal/waist line with a few patches of healing round lesions. Appear to be resolving herpetic lesions.  Marland Kitchen Psychiatric: She has a normal mood and affect. Her  behavior is normal. Judgment and thought content normal      Assessment/Plan: 1. Functional deficits secondary to Guillain Barre Syndrome which require 3+ hours per day of interdisciplinary therapy in a comprehensive inpatient rehab setting. Physiatrist is providing close team supervision and 24 hour management of active medical problems listed below. Physiatrist and rehab team continue to assess barriers to discharge/monitor patient progress toward functional and medical goals. FIM: FIM - Bathing Bathing Steps Patient Completed: Chest;Right Arm;Left Arm;Abdomen;Front perineal area;Right upper leg;Left upper leg Bathing: 3: Mod-Patient completes 5-7 79f 10 parts or 50-74%  FIM - Upper Body Dressing/Undressing Upper body dressing/undressing steps patient completed: Thread/unthread right sleeve of pullover shirt/dresss;Thread/unthread left sleeve of pullover shirt/dress;Put head through opening of pull over shirt/dress Upper body dressing/undressing: 4: Min-Patient completed 75 plus % of tasks FIM - Lower Body Dressing/Undressing Lower body dressing/undressing steps patient completed: Don/Doff right sock Lower body dressing/undressing: 0: Wears gown/pajamas-no public clothing  FIM - Toileting Toileting steps completed by patient: Performs perineal hygiene Toileting Assistive Devices: Grab bar or rail for support Toileting: 3: Mod-Patient completed 2 of 3 steps  FIM - Radio producer Devices: Elevated toilet seat Toilet Transfers: 3-To toilet/BSC: Mod A (lift or lower assist);3-From toilet/BSC: Mod A (lift or lower assist)  FIM - Bed/Chair Transfer Bed/Chair Transfer Assistive Devices: Bed rails;Arm rests;Walker Bed/Chair Transfer: 1: Two helpers  FIM - Locomotion: Glass blower/designer: Wheelchair: 1: Travels less than 50 ft with  minimal assistance (Pt.>75%) FIM - Locomotion: Ambulation Locomotion: Ambulation Assistive Devices: Walker -  Rolling Ambulation/Gait Assistance: 1: +2 Total assist Locomotion: Ambulation: 1: Two helpers  Comprehension Comprehension Mode: Auditory Comprehension: 6-Follows complex conversation/direction: With extra time/assistive device  Expression Expression Mode: Verbal Expression: 5-Expresses complex 90% of the time/cues < 10% of the time  Social Interaction Social Interaction: 5-Interacts appropriately 90% of the time - Needs monitoring or encouragement for participation or interaction.  Problem Solving Problem Solving: 3-Solves basic 50 - 74% of the time/requires cueing 25 - 49% of the time  Memory Memory: 5-Recognizes or recalls 90% of the time/requires cueing < 10% of the time Medical Problem List and Plan:  1. Suspect GBS. (? Myasthenia gravis). IVIG completed. Continue CellCept  2. DVT Prophylaxis/Anticoagulation: History of DVT with IVC filter  3. Pain Management: Hydrocodone as needed. Monitor with increased mobility  -added lidoderm patch for left low back/hip pain---suspect pain is from lumbar spondylosis/referral  3. Mood/anxiety/depression: Wellbutrin 150 mg daily, Xanax 0.25 mg twice daily as needed and 1 mg each bedtime as needed  4. Neuropsych: This patient is capable of making decisions on her own behalf.  5. Hypothyroidism. Synthroid  6. Hyperlipidemia. Lipitor  7. Chronic anemia/thrombocytopenia. Latest hemoglobin 8.6 and platelet count 84,000 improved from 67,000.   iron supplement held.  followup CBC  8. Sacral wounds. Appreciate WOC input and recs -  likely herpetic lesions/ HSV   -air mattress  9. Chronic loose stool: more severe during this hospitalization   -probiotic  -imodium 4mg  qid  -continue rectal tube  -stopped iron and multivit due to nausea  -c diff negative  -continue fiber and questran  -GI contacted yesterday who will be following at a distance  -cellcept also likely contributing   10. FEN  -continue IVF for now, decrease to 50cc /hr             -added megace for appetite, encouraged pt to push po intake   LOS (Days) 9 A FACE TO FACE EVALUATION WAS PERFORMED  SWARTZ,ZACHARY T 06/28/2013 8:40 AM

## 2013-06-29 ENCOUNTER — Inpatient Hospital Stay (HOSPITAL_COMMUNITY): Payer: Medicare Other | Admitting: Physical Therapy

## 2013-06-29 DIAGNOSIS — N179 Acute kidney failure, unspecified: Secondary | ICD-10-CM

## 2013-06-29 DIAGNOSIS — I428 Other cardiomyopathies: Secondary | ICD-10-CM

## 2013-06-29 DIAGNOSIS — F411 Generalized anxiety disorder: Secondary | ICD-10-CM

## 2013-06-29 LAB — BASIC METABOLIC PANEL
BUN: 33 mg/dL — ABNORMAL HIGH (ref 6–23)
CHLORIDE: 107 meq/L (ref 96–112)
CO2: 14 mEq/L — ABNORMAL LOW (ref 19–32)
CREATININE: 1.16 mg/dL — AB (ref 0.50–1.10)
Calcium: 6.8 mg/dL — ABNORMAL LOW (ref 8.4–10.5)
GFR calc Af Amer: 48 mL/min — ABNORMAL LOW (ref >90)
GFR, EST NON AFRICAN AMERICAN: 41 mL/min — AB (ref >90)
Glucose, Bld: 107 mg/dL — ABNORMAL HIGH (ref 70–99)
Potassium: 4.1 mEq/L (ref 3.7–5.3)
Sodium: 132 mEq/L — ABNORMAL LOW (ref 137–147)

## 2013-06-29 NOTE — Progress Notes (Signed)
Patient ID: [name], female   DOB: August 27, 1924, 80 y.o.   MRN: [id number] [name] is a 80 y.o. female August 19, 1924 [id number]  Subjective: No new complaints. No new problems. Slept well. Feeling OK.  Objective: Vital signs in last 24 hours: Temp:  [98.4 F (36.9 C)] 98.4 F (36.9 C) (02/07 0600) Pulse Rate:  [70-88] 70 (02/07 0600) Resp:  [12-17] 17 (02/07 0600) BP: (78-103)/(41-67) 94/59 mmHg (02/07 0600) SpO2:  [96 %-97 %] 96 % (02/07 0600) Weight change:  Last BM Date: 06/28/13  Intake/Output from previous day: 02/06 0701 - 02/07 0700 In: 240 [P.O.:240] Out: 1000 [Urine:150; Stool:850] Last cbgs: CBG (last 3)  No results found for this basename: GLUCAP,  in the last 72 hours   Physical Exam General: No apparent distress  Obese Lungs: Normal effort. Lungs clear to auscultation, no crackles or wheezes. Cardiovascular: Regular rate and rhythm, no edema Abdomen: S/NT/ND; BS(+) Musculoskeletal:  weak Neurological: No new neurological deficits Wounds:   N/A    Alert, cooperative   Lab Results: BMET    Component Value Date/Time   NA 132* 06/29/2013 0445   K 4.1 06/29/2013 0445   CL 107 06/29/2013 0445   CO2 14* 06/29/2013 0445   GLUCOSE 107* 06/29/2013 0445   BUN 33* 06/29/2013 0445   CREATININE 1.16* 06/29/2013 0445   CALCIUM 6.8* 06/29/2013 0445   GFRNONAA 41* 06/29/2013 0445   GFRAA 48* 06/29/2013 0445   CBC    Component Value Date/Time   WBC 3.8* 06/26/2013 0435   RBC 3.04* 06/26/2013 0435   RBC 2.73* 12/08/2012 0828   HGB 9.3* 06/26/2013 0435   HGB 7.3* 01/24/2013 1440   HCT 27.7* 06/26/2013 0435   PLT 79* 06/26/2013 0435   MCV 91.1 06/26/2013 0435   MCH 30.6 06/26/2013 0435   MCHC 33.6 06/26/2013 0435   RDW 15.8* 06/26/2013 0435   LYMPHSABS 0.3* 06/20/2013 0820   MONOABS 0.2 06/20/2013 0820   EOSABS 0.0 06/20/2013 0820   BASOSABS 0.0 06/20/2013 0820    Studies/Results: No results found.  Medications: I have reviewed the patient's current  medications.  Assessment/Plan:   1. Suspect GBS. (? Myasthenia gravis). IVIG completed. Continue CellCept  2. DVT Prophylaxis/Anticoagulation: History of DVT with IVC filter  3. Pain Management: Hydrocodone as needed. Monitor with increased mobility  -added lidoderm patch for left low back/hip pain---suspect pain is from lumbar spondylosis/referral  3. Mood/anxiety/depression: Wellbutrin 150 mg daily, Xanax 0.25 mg twice daily as needed and 1 mg each bedtime as needed  4. Neuropsych: This patient is capable of making decisions on her own behalf.  5. Hypothyroidism. Synthroid  6. Hyperlipidemia. Lipitor  7. Chronic anemia/thrombocytopenia. Latest hemoglobin 8.6 and platelet count 84,000 improved from 67,000. iron supplement held. followup CBC  8. Sacral wounds. Appreciate WOC input and recs  - likely herpetic lesions/ HSV  -air mattress  9. Chronic loose stool: more severe during this hospitalization  -probiotic  -imodium 4mg  qid  -continue rectal tube  -stopped iron and multivit due to nausea  -c diff negative  -continue fiber and questran  -GI contacted yesterday who will be following at a distance  -cellcept also likely contributing  10. FEN  -continue IVF for now, decrease to 50cc /hr  -added megace for appetite, encouraged pt to push po intake  Cont Rx    Length of stay, days: [place] , [place] 06/29/2013, 9:09 AM

## 2013-06-29 NOTE — Progress Notes (Signed)
Physical Therapy Note  Patient Details  Name: Paizlie Klaus MRN: [id number] Date of Birth: Jan 17, 1925 Today's Date: 06/29/2013  1320 - 1350 (30 minutes) individual (missed 30 minutes secondary to nursing care) Pain: no complaint of pain Focus of treatment: bed mobility; sitting tolerance monitoring BP Treatment: pt in bed upon arrival requesting bedpan; pt able to roll from side to side with mod/max assist using bedrail + vcs fo technique; BP supine 110/72 ; supine to side to sit max assist (pt 20%); sitting edge of bed (BP 60/36 with increased c/o dizziness) ; returned to supine .    Marquon Alcala,JIM 06/29/2013, 1:46 PM

## 2013-06-30 DIAGNOSIS — I4891 Unspecified atrial fibrillation: Secondary | ICD-10-CM

## 2013-06-30 DIAGNOSIS — D638 Anemia in other chronic diseases classified elsewhere: Secondary | ICD-10-CM

## 2013-06-30 DIAGNOSIS — K5190 Ulcerative colitis, unspecified, without complications: Secondary | ICD-10-CM

## 2013-06-30 MED ORDER — DIPHENOXYLATE-ATROPINE 2.5-0.025 MG PO TABS: 1.0000 | ORAL_TABLET | Freq: Four times a day (QID) | ORAL | Status: DC | PRN

## 2013-06-30 NOTE — Progress Notes (Signed)
Patient ID: [name], female   DOB: Oct 03, 1924, 80 y.o.   MRN: [id number] [name] is a 80 y.o. female 1924-10-15 [id number]  Subjective: No new complaints. No new problems. Slept well. Feeling OK.  Objective: Vital signs in last 24 hours: Temp:  [97 F (36.1 C)-97.3 F (36.3 C)] 97 F (36.1 C) (02/08 0530) Pulse Rate:  [78] 78 (02/08 0530) Resp:  [17] 17 (02/08 0530) BP: (95-99)/(61-65) 95/61 mmHg (02/08 0530) SpO2:  [99 %] 99 % (02/08 0530) Weight change:  Last BM Date: 06/30/13  Intake/Output from previous day: 02/07 0701 - 02/08 0700 In: 3712.9 [P.O.:120; I.V.:3592.9] Out: 1150 [Urine:200; Stool:950] Last cbgs: CBG (last 3)  No results found for this basename: GLUCAP,  in the last 72 hours   Physical Exam General: No apparent distress  Obese Lungs: Normal effort. Lungs clear to auscultation, no crackles or wheezes. Cardiovascular: Regular rate and rhythm, no edema Abdomen: S/NT/ND; BS(+) Musculoskeletal:  weak Neurological: No new neurological deficits Wounds:   N/A    Alert, cooperative Loose stool in the bag 600 cc   Lab Results: BMET    Component Value Date/Time   NA 132* 06/29/2013 0445   K 4.1 06/29/2013 0445   CL 107 06/29/2013 0445   CO2 14* 06/29/2013 0445   GLUCOSE 107* 06/29/2013 0445   BUN 33* 06/29/2013 0445   CREATININE 1.16* 06/29/2013 0445   CALCIUM 6.8* 06/29/2013 0445   GFRNONAA 41* 06/29/2013 0445   GFRAA 48* 06/29/2013 0445   CBC    Component Value Date/Time   WBC 3.8* 06/26/2013 0435   RBC 3.04* 06/26/2013 0435   RBC 2.73* 12/08/2012 0828   HGB 9.3* 06/26/2013 0435   HGB 7.3* 01/24/2013 1440   HCT 27.7* 06/26/2013 0435   PLT 79* 06/26/2013 0435   MCV 91.1 06/26/2013 0435   MCH 30.6 06/26/2013 0435   MCHC 33.6 06/26/2013 0435   RDW 15.8* 06/26/2013 0435   LYMPHSABS 0.3* 06/20/2013 0820   MONOABS 0.2 06/20/2013 0820   EOSABS 0.0 06/20/2013 0820   BASOSABS 0.0 06/20/2013 0820    Studies/Results: No results found.  Medications: I have reviewed the  patient's current medications.  Assessment/Plan:   1. Suspect GBS. (? Myasthenia gravis). IVIG completed. Continue CellCept  2. DVT Prophylaxis/Anticoagulation: History of DVT with IVC filter  3. Pain Management: Hydrocodone as needed. Monitor with increased mobility  -added lidoderm patch for left low back/hip pain---suspect pain is from lumbar spondylosis/referral  3. Mood/anxiety/depression: Wellbutrin 150 mg daily, Xanax 0.25 mg twice daily as needed and 1 mg each bedtime as needed  4. Neuropsych: This patient is capable of making decisions on her own behalf.  5. Hypothyroidism. Synthroid  6. Hyperlipidemia. Lipitor  7. Chronic anemia/thrombocytopenia. Latest hemoglobin 8.6 and platelet count 84,000 improved from 67,000. iron supplement held. followup CBC  8. Sacral wounds. Appreciate WOC input and recs  - likely herpetic lesions/ HSV  -air mattress  9. Chronic loose stool: more severe during this hospitalization  -probiotic  -lomotil to try -no dairy  -may try enzymes, ie Creon -continue rectal tube  -stopped iron and multivit due to nausea  -c diff negative  -continue fiber and questran  -GI contacted yesterday who will be following at a distance  -cellcept also likely contributing  10. FEN  -continue IVF for now, decrease to 50cc /hr  -added megace for appetite, encouraged pt to push po intake  Cont Rx    Length of stay, days: [place] , [place]  06/30/2013, 9:24 AM

## 2013-07-01 ENCOUNTER — Inpatient Hospital Stay (HOSPITAL_COMMUNITY): Payer: Medicare Other

## 2013-07-01 ENCOUNTER — Inpatient Hospital Stay (HOSPITAL_COMMUNITY): Payer: Medicare Other | Admitting: Physical Therapy

## 2013-07-01 DIAGNOSIS — G610 Guillain-Barre syndrome: Secondary | ICD-10-CM

## 2013-07-01 DIAGNOSIS — R197 Diarrhea, unspecified: Secondary | ICD-10-CM | POA: Diagnosis present

## 2013-07-01 MED ORDER — COLESTIPOL HCL 1 G PO TABS
1.0000 g | ORAL_TABLET | Freq: Two times a day (BID) | ORAL | Status: DC
  Administered 2013-07-01 – 2013-07-02 (×2): 1 g via ORAL
  Filled 2013-07-01 (×4): qty 1

## 2013-07-01 NOTE — Progress Notes (Signed)
Occupational Therapy Session Note  Patient Details  Name: [name] MRN: [id number] Date of Birth: 08-Jul-1924  Today's Date: 07/01/2013 Time: 0830-0930 Time Calculation (min): 60 min  Short Term Goals: Week 2:  OT Short Term Goal 1 (Week 2): Patient will complete lower body bathing and dressing with max assist OT Short Term Goal 2 (Week 2): Patient will complete toilet transfer with mod assist OT Short Term Goal 3 (Week 2): Patient will complete upper body bathing and dressing with supervision and setup OT Short Term Goal 4 (Week 2): Patient will demo ability to perform sit <> stand during ADL to pull-up underwear and/or pants with min assist  Skilled Therapeutic Interventions/Progress Updates:  ADL-retraining with focus on improved bed mobility, transfer, and improved activity tolerance.   Patient received supine in bed and prepared for ADL although complaining of lethargy.   With extra time, patient was able to advance both legs toward edge of bed, with verbal cues for sequencing.  Patient rose from supine to sit with only mod assist (lifting upper trunk) this session (pt = 70%).   Patient reported symptoms of dizziness that lasted approx 5 minutes and prevented her ability to attempt stand-pivot transfer.   OT was unable to capture accurate BP/HR using dynamap this session however patient completed total assist squat pivot transfer to w/c without adverse response.   After brief rest, patient received total assist with bathing lower legs and feet and assist with donning TEDs. Patient became more alert and participated in upper body bathing and dressing with setup assist but would not attempt standing to wash peri-area this session.   Patient returned to bedside in w/c, reclined, call light placed within reach.     Therapy Documentation Precautions:  Precautions Precautions: Fall Restrictions Weight Bearing Restrictions: No  Pain: Pain Assessment Pain Assessment: No/denies  pain  ADL: ADL ADL Comments: see FIM  See FIM for current functional status  Therapy/Group: Individual Therapy  [place] 07/01/2013, 10:03 AM

## 2013-07-01 NOTE — Progress Notes (Signed)
Subjective/Complaints: Can't swallow food, but then on second thought it's more the smell of the food which makes her sick and unable to eat/swallow. Still having watery stool.  A 12 point review of systems has been performed and if not noted above is otherwise negative.   Objective: Vital Signs: Blood pressure 100/64, pulse 82, temperature 98.2 F (36.8 C), temperature source Axillary, resp. rate 17, height 5\' 4"  (1.626 m), weight 64.3 kg (141 lb 12.1 oz), SpO2 98.00%. No results found. No results found for this basename: WBC, HGB, HCT, PLT,  in the last 72 hours  Recent Labs  06/29/13 0445  NA 132*  K 4.1  CL 107  GLUCOSE 107*  BUN 33*  CREATININE 1.16*  CALCIUM 6.8*   CBG (last 3)  No results found for this basename: GLUCAP,  in the last 72 hours  Wt Readings from Last 3 Encounters:  06/26/13 64.3 kg (141 lb 12.1 oz)  06/17/13 66.9 kg (147 lb 7.8 oz)  05/31/13 70.761 kg (156 lb)    Physical Exam:  Constitutional: She is oriented to person, place. Looks a bit brighter today Frail 80 year old female.  HENT:  Head: Normocephalic.  Eyes: EOM are normal.  Neck: Normal range of motion. Neck supple. No thyromegaly present.  Cardiovascular: Normal rate and regular rhythm. No murmurs. No gallops  Respiratory: Effort normal and breath sounds normal. No respiratory distress. No wheezes, rales  GI: Soft. Bowel sounds are normal. She exhibits no distension. Rectal tube still with soft, liquid stool Neurological: She is alert and oriented to place. Slow to arouse this am.follows all simple commands  Follows simple commands. UE 4-prox with deltoid, bicep to 4 distally. LE's 1 to 1+ with LEFt HF (pain component), RIGHT HF 3. KE 2- left, 2+ right, ankles 3/5. Decreased LT at both feet ,no ptosis, no dsyarthria, CN exam intact  Skin: stable gluteal/perineal/waist line with a few patches of healing round lesions. Appear to be resolving herpetic lesions.  Marland Kitchen Psychiatric: affect very  flat. Appears discouraged.      Assessment/Plan: 1. Functional deficits secondary to Guillain Barre Syndrome which require 3+ hours per day of interdisciplinary therapy in a comprehensive inpatient rehab setting. Physiatrist is providing close team supervision and 24 hour management of active medical problems listed below. Physiatrist and rehab team continue to assess barriers to discharge/monitor patient progress toward functional and medical goals.  May have to look at another venue for care. Given ongoing fatigue, bowel issues, etc---having difficulties tolerating therapy at this level, and we don't seem to be making any progress.  FIM: FIM - Bathing Bathing Steps Patient Completed: Chest;Right Arm;Left Arm;Abdomen;Front perineal area;Right upper leg;Left upper leg Bathing: 3: Mod-Patient completes 5-7 60f 10 parts or 50-74%  FIM - Upper Body Dressing/Undressing Upper body dressing/undressing steps patient completed: Thread/unthread right sleeve of pullover shirt/dresss;Thread/unthread left sleeve of pullover shirt/dress;Put head through opening of pull over shirt/dress;Pull shirt over trunk Upper body dressing/undressing: 4: Min-Patient completed 75 plus % of tasks FIM - Lower Body Dressing/Undressing Lower body dressing/undressing steps patient completed: Don/Doff right sock Lower body dressing/undressing: 1: Total-Patient completed less than 25% of tasks  FIM - Toileting Toileting steps completed by patient: Performs perineal hygiene Toileting Assistive Devices: Grab bar or rail for support Toileting: 3: Mod-Patient completed 2 of 3 steps  FIM - Radio producer Devices: Elevated toilet seat Toilet Transfers: 3-To toilet/BSC: Mod A (lift or lower assist);3-From toilet/BSC: Mod A (lift or lower assist)  FIM -  Bed/Chair Financial planner Devices: Arm rests;Bed rails;HOB elevated Bed/Chair Transfer: 2: Supine > Sit: Max A (lifting  assist/Pt. 25-49%);2: Sit > Supine: Max A (lifting assist/Pt. 25-49%);2: Bed > Chair or W/C: Max A (lift and lower assist);1: Chair or W/C > Bed: Total A (helper does all/Pt. < 25%)  FIM - Locomotion: Wheelchair Locomotion: Wheelchair: 1: Travels less than 50 ft with minimal assistance (Pt.>75%) FIM - Locomotion: Ambulation Locomotion: Ambulation Assistive Devices: Administrator Ambulation/Gait Assistance: 1: +2 Total assist Locomotion: Ambulation: 1: Two helpers  Comprehension Comprehension Mode: Auditory Comprehension: 6-Follows complex conversation/direction: With extra time/assistive device  Expression Expression Mode: Verbal Expression: 5-Expresses complex 90% of the time/cues < 10% of the time  Social Interaction Social Interaction: 5-Interacts appropriately 90% of the time - Needs monitoring or encouragement for participation or interaction.  Problem Solving Problem Solving: 3-Solves basic 50 - 74% of the time/requires cueing 25 - 49% of the time  Memory Memory: 5-Recognizes or recalls 90% of the time/requires cueing < 10% of the time Medical Problem List and Plan:  1. Suspect GBS. (? Myasthenia gravis). IVIG completed. Continue CellCept  2. DVT Prophylaxis/Anticoagulation: History of DVT with IVC filter  3. Pain Management: Hydrocodone as needed. Monitor with increased mobility  -added lidoderm patch for left low back/hip pain---suspect pain is from lumbar spondylosis/referral  3. Mood/anxiety/depression: Wellbutrin 150 mg daily, Xanax 0.25 mg twice daily as needed and 1 mg each bedtime as needed   --seems to be having increased depressions with flat affect, loss of energy, poor appetite, dreams of dying--would like to get neuropsych's input  4. Neuropsych: This patient is capable of making decisions on her own behalf.   5. Hypothyroidism. Synthroid  6. Hyperlipidemia. Lipitor  7. Chronic anemia/thrombocytopenia. Latest hemoglobin 8.6 and platelet count 84,000 improved  from 67,000.   iron supplement held.  followup CBC  8. Sacral wounds. Appreciate WOC input and recs -  likely herpetic lesions/ HSV   -air mattress  9. Chronic loose stool: more severe during this hospitalization   -probiotic  -imodium 4mg  qid  -rectal tube out  -stopped iron and multivit due to nausea  -c diff negative  -continue fiber and questran  -GI aware  -cellcept also likely contributing  -not sure what else to add at this point   10. FEN  -continue IVF at 50cc /hr            -added megace for appetite, encouraged pt to push po intake  -check labs tomorrow   LOS (Days) 12 A FACE TO FACE EVALUATION WAS PERFORMED  SWARTZ,ZACHARY T 07/01/2013 7:53 AM

## 2013-07-01 NOTE — Consult Note (Signed)
Referring Provider: Dr. Naaman Plummer Primary Care Physician:  Laurey Morale, MD Primary Gastroenterologist:  Dr. Carlean Purl  Reason for Consultation:  Persistent loose stools  HPI: [name] is a 80 y.o. female with history of myasthenia gravis on cellcept, uterine cancer, DVT s/p IVC filter because of GIB, hypothyroidism, NICM, PAF, anxiety. Last fall, [name] was hospitalized for incarcerated hernia s/p surgical repair with concomitant cholecystectomy, course complicated by type 2 NSTEMI with cardiomyopathy and SBO. She has also had CMV colitis and was taken off of her prednisone, which had been used for her MG (along with cellcept). She presented to the hospital on 1/20 with complaints of sudden onset of weakness, numbness in lower extremities.  She was seen by neurology and is was determined that she may have had Guillain-Barre; received IVIG.  She has been in rehab since 1/28.  GI is being consulted due to persistently loose stools despite Questran 4 gm BID, daily probiotic, Fibercon BID, and lomotil QID prn.  She was Cdiff negative on 2/3.  Has a rectal tube in place and is on lactose free diet.  Having just under 1 liter of liquid stool each day.  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  I believe that she completed a course of Ganciclovir, but I do not see where she was ever followed up by ID or GI after that hospitalization.  She remains on Cellcept for MG.  She says that the diarrhea seemed to get worse again around Christmas.  No abdominal pain.  No blood in stool.  Has 200 cc light brown stool in rectal tube.  Is not eating or drinking much.  Says that she has a lot of nausea; even seeing food makes her nauseated.    Past Medical History  Diagnosis Date  . Anemia   . Hyperlipemia   . Hypertension   . Thyroid disease     hypothyroidism  . Anxiety   . Vitamin B 12 deficiency   . TIA (transient ischemic attack)     4-09  . Microscopic hematuria      benign microscopic hematuria, worked up b Dr Katrine Coho  . Gall stone     porcelain gall bladder with single stone  per CT 02-2009 Dr Newton Pigg  . Osteoporosis     DEXA on 04-30-10  . Myasthenia gravis     sees Dr. Krista Blue   . Ulcerative colitis   . DVT of leg (deep venous thrombosis) 12/2012    left leg; coumadin d/c b/c of LGI bleed; s/p IVC filter  . CMV colitis   . Cancer     uterine/ovarian  . Chronic systolic CHF (congestive heart failure)   . Cardiomyopathy     a. in setting of incarcerated hernia, AF with RVR, ARF, NSTEMI => probable [place] CM given WM abnormality; Echocardiogram (03/23/13): Mod LVH, EF 30-35% with AK of the entire apical myocardium, AS/AL/Inf HK, Gr 1 DD, aortic sclerosis, MAC, mild MR, atrial septal lipomatous hypertrophy, mod TR, PASP 37;   Echo (04/2013): EF 60-65% normal wall motion, Gr 1 DD, MAC, mild to mod LAE  . Paroxysmal atrial fibrillation     not on coumadin due to AF in setting of acute illness as well as prior GI bleed on coumadin for DVT  . NSTEMI (non-ST elevated myocardial infarction)     a. 03/2013 in setting of incarcerated ventral hernia, SBO, ARF => likely Type 2 NSTEMI    Past Surgical History  Procedure Laterality Date  . Cardiac catheterization      04/1999  . Abdominal hysterectomy      with oophorectomy  . Colonoscopy  1999    normal   . Colonoscopy N/A 12/10/2012    Procedure: COLONOSCOPY;  Surgeon: Gatha Mayer, MD;  Location: [facility];  Service: Endoscopy;  Laterality: N/A;  . Flexible sigmoidoscopy N/A 01/26/2013    Procedure: FLEXIBLE SIGMOIDOSCOPY;  Surgeon: Gatha Mayer, MD;  Location: [facility];  Service: Endoscopy;  Laterality: N/A;  . Ventral hernia repair N/A 03/25/2013    Procedure: LAPAROSCOPIC VENTRAL HERNIA REPAIR ;  Surgeon: Adin Hector, MD;  Location: [facility];  Service: General;  Laterality: N/A;  . Cholecystectomy N/A 03/25/2013    Procedure: LAPAROSCOPIC CHOLECYSTECTOMY WITH INTRAOPERATIVE  CHOLANGIOGRAM;  Surgeon: Adin Hector, MD;  Location: [facility];  Service: General;  Laterality: N/A;  . Insertion of mesh N/A 03/25/2013    Procedure: INSERTION OF MESH;  Surgeon: Adin Hector, MD;  Location: [facility];  Service: General;  Laterality: N/A;  . Laparoscopic lysis of adhesions N/A 03/25/2013    Procedure: LAPAROSCOPIC LYSIS OF ADHESIONS;  Surgeon: Adin Hector, MD;  Location: [facility];  Service: General;  Laterality: N/A;  . Omentectomy N/A 03/25/2013    Procedure: PARTIAL OMENTECTOMY;  Surgeon: Adin Hector, MD;  Location: [facility];  Service: General;  Laterality: N/A;    Prior to Admission medications   Medication Sig Start Date End Date Taking? Authorizing Provider  ALPRAZolam (XANAX) 0.25 MG tablet Take 0.25 mg by mouth 2 (two) times daily as needed for anxiety. 05/07/13  Yes Laurey Morale, MD  ALPRAZolam Duanne Moron) 1 MG tablet Take 1 mg by mouth at bedtime as needed for anxiety or sleep. 05/07/13  Yes Laurey Morale, MD  aspirin 81 MG chewable tablet Chew 1 tablet (81 mg total) by mouth daily. 04/11/13  Yes Ivan Anchors Love, PA-C  atorvastatin (LIPITOR) 20 MG tablet Take 1 tablet (20 mg total) by mouth daily with supper. 02/12/13  Yes Daniel J Angiulli, PA-C  bisacodyl (DULCOLAX) 10 MG suppository Place 1 suppository (10 mg total) rectally daily as needed for moderate constipation. 06/19/13  Yes Jessica U Vann, DO  buPROPion (WELLBUTRIN XL) 150 MG 24 hr tablet Take 1 tablet (150 mg total) by mouth every morning. 05/07/13 130-Dec-2015 Yes Laurey Morale, MD  Esomeprazole Magnesium (NEXIUM PO) Take 1 capsule by mouth daily.    Yes Historical Provider, MD  feeding supplement (ENSURE COMPLETE) LIQD Take 237 mLs by mouth daily. 12/12/12  Yes Monika Salk, MD  ferrous sulfate 325 (65 FE) MG tablet Take 325 mg by mouth daily with breakfast. 04/11/13  Yes Ivan Anchors Love, PA-C  Glucosamine HCl (GLUCOSAMINE PO) Take 1 tablet by mouth daily.   Yes Historical Provider, MD  HYDROcodone-acetaminophen  (NORCO/VICODIN) 5-325 MG per tablet Take 1 tablet by mouth every 6 (six) hours as needed for moderate pain. 06/19/13  Yes Geradine Girt, DO  levothyroxine (SYNTHROID, LEVOTHROID) 125 MCG tablet Take 1 tablet (125 mcg total) by mouth daily. 05/07/13  Yes Laurey Morale, MD  loperamide (IMODIUM) 2 MG capsule Take 2-4 mg by mouth 2 (two) times daily. 2mg  in the morning and 4mg  at night 04/11/13  Yes Ivan Anchors Love, PA-C  metoCLOPramide (REGLAN) 5 MG tablet Take 1 tablet (5 mg total) by mouth 3 (three) times daily before meals. 06/19/13  Yes Geradine Girt, DO  mycophenolate (CELLCEPT) 500 MG tablet  Take 2 tablets (1,000 mg total) by mouth 2 (two) times daily. 02/12/13  Yes Daniel J Angiulli, PA-C  potassium chloride (K-DUR) 10 MEQ tablet Take 20 mEq by mouth 2 (two) times daily. 05/07/13  Yes Laurey Morale, MD  promethazine (PHENERGAN) 12.5 MG tablet Take 1 tablet (12.5 mg total) by mouth every 6 (six) hours as needed for nausea. 06/19/13  Yes Geradine Girt, DO    Current Facility-Administered Medications  Medication Dose Route Frequency Provider Last Rate Last Dose  . 0.45 % sodium chloride infusion   Intravenous Continuous [name], MD 50 mL/hr at 06/30/13 1113    . acetaminophen (TYLENOL) tablet 650 mg  650 mg Oral Q6H PRN Lavon Paganini Angiulli, PA-C   650 mg at 07/01/13 1230   Or  . acetaminophen (TYLENOL) suppository 650 mg  650 mg Rectal Q6H PRN Lavon Paganini Angiulli, PA-C      . ALPRAZolam Duanne Moron) tablet 0.25 mg  0.25 mg Oral BID PRN Lavon Paganini Angiulli, PA-C      . ALPRAZolam Duanne Moron) tablet 1 mg  1 mg Oral QHS PRN Lavon Paganini Angiulli, PA-C   1 mg at 06/21/13 2201  . antiseptic oral rinse (BIOTENE) solution 15 mL  15 mL Mouth Rinse BID [name], MD   15 mL at 07/01/13 0800  . aspirin chewable tablet 81 mg  81 mg Oral Daily Lavon Paganini Angiulli, PA-C   81 mg at 07/01/13 0815  . atorvastatin (LIPITOR) tablet 20 mg  20 mg Oral Q supper Lavon Paganini Angiulli, PA-C   20 mg at 06/30/13 1647  . bisacodyl  (DULCOLAX) suppository 10 mg  10 mg Rectal Daily PRN Lavon Paganini Angiulli, PA-C      . buPROPion (WELLBUTRIN XL) 24 hr tablet 150 mg  150 mg Oral Q breakfast Lavon Paganini Angiulli, PA-C   150 mg at 07/01/13 0815  . cholestyramine (QUESTRAN) packet 4 g  4 g Oral BID [name], MD   4 g at 07/01/13 0813  . diphenoxylate-atropine (LOMOTIL) 2.5-0.025 MG per tablet 1 tablet  1 tablet Oral QID PRN Evie Lacks Plotnikov, MD      . feeding supplement (RESOURCE BREEZE) (RESOURCE BREEZE) liquid 1 Container  1 Container Oral BID BM Elonda Husky, RD   1 Container at 06/30/13 1436  . HYDROcodone-acetaminophen (NORCO/VICODIN) 5-325 MG per tablet 1 tablet  1 tablet Oral Q6H PRN Cathlyn Parsons, PA-C   1 tablet at 06/29/13 4235  . lactobacillus (FLORANEX/LACTINEX) granules 1 g  1 g Oral TID WC [name], MD   1 g at 07/01/13 1229  . lidocaine (LIDODERM) 5 % 1 patch  1 patch Transdermal Q24H [name], MD   1 patch at 06/30/13 1700  . megestrol (MEGACE) 400 MG/10ML suspension 400 mg  400 mg Oral BID [name], MD   400 mg at 07/01/13 0813  . mycophenolate (CELLCEPT) capsule 1,000 mg  1,000 mg Oral BID Lavon Paganini Angiulli, PA-C   1,000 mg at 07/01/13 0815  . pantoprazole (PROTONIX) EC tablet 40 mg  40 mg Oral Daily Arman Bogus, RPH   40 mg at 07/01/13 0815  . polycarbophil (FIBERCON) tablet 625 mg  625 mg Oral BID [name], MD   625 mg at 07/01/13 0813  . potassium chloride SA (K-DUR,KLOR-CON) CR tablet 20 mEq  20 mEq Oral BID [name], MD   20 mEq at 07/01/13 0814  . prochlorperazine (COMPAZINE) tablet  10 mg  10 mg Oral TID Lavon Paganini Angiulli, PA-C   10 mg at 07/01/13 [id number]  . sodium chloride 0.9 % injection 10-40 mL  10-40 mL Intracatheter PRN [name], MD   10 mL at 06/29/13 0443    Allergies as of 06/19/2013 - Review Complete 06/19/2013  Allergen Reaction Noted  . Zofran [ondansetron hcl]  02/26/2013  . Ivp dye [iodinated diagnostic agents] Rash 12/31/2012   . Sulfamethoxazole Rash 11/10/2006    Family History  Problem Relation Age of Onset  . Heart disease Father   . Heart disease Sister   . Colon cancer Daughter   . Heart disease Son   . Heart disease Daughter     History   Social History  . Marital Status: Widowed    Spouse Name: N/A    Number of Children: N/A  . Years of Education: N/A   Occupational History  . Not on file.   Social History Main Topics  . Smoking status: Never Smoker   . Smokeless tobacco: Never Used  . Alcohol Use: No  . Drug Use: No  . Sexual Activity: No   Other Topics Concern  . Not on file   Social History Narrative   Lives with her daughter and son-in-law and normally gets around with rolling walker.  Advanced     Review of Systems: Ten point ROS is O/W negative except as mentioned in HPI.  Physical Exam: Vital signs in last 24 hours: Temp:  [97.4 F (36.3 C)-98.2 F (36.8 C)] 98.2 F (36.8 C) (02/09 0525) Pulse Rate:  [82-83] 82 (02/09 0525) Resp:  [17] 17 (02/09 0525) BP: (100-101)/(64-66) 100/64 mmHg (02/09 0525) SpO2:  [98 %-99 %] 98 % (02/09 0525) Last BM Date: 06/30/13 General:  Alert, elderly, pleasant and cooperative in NAD Head:  Normocephalic and atraumatic. Eyes:  Sclera clear, no icterus.  Conjunctiva pink. Ears:  Normal auditory acuity. Mouth:  No deformity or lesions.  Poor dentition.   Lungs:  Clear throughout to auscultation.  No wheezes, crackles, or rhonchi.  Heart:  Regular rate and rhythm; no murmurs, clicks, rubs, or gallops. Abdomen:  Soft, non-distended.  BS present.  Non-tender.  Rectal:  Deferred.  Rectal tube with light brown liquid stool (200 cc).  Msk:  Symmetrical without gross deformities. Pulses:  Normal pulses noted. Extremities:  Trace pitting edema in B/L LE's Neurologic:  Alert and  oriented x4.  LE weakness.  Psych:  Alert and cooperative.  Flat affect.  Intake/Output from previous day: 02/08 0701 - 02/09 0700 In: 390 [P.O.:390] Out: 927  [Urine:2; Stool:925]  BMET  Recent Labs  06/29/13 0445  NA 132*  K 4.1  CL 107  CO2 14*  GLUCOSE 107*  BUN 33*  CREATININE 1.16*  CALCIUM 6.8*   IMPRESSION/PLAN:  -Diarrhea.  History of CMV colitis s/p treatment with Ganciclovir.  Having watery stools (just under one liter per day); has rectal tube.  On questran BID, but nursing is unsure how much she is actually getting because she has not been eating or drinking much.  Will change to colestid 1 gm (pill form) BID instead.  Continue lomotil prn, probiotic, and fiber supplement.  Lactose free diet.  Could consider trial of pancreatic enzymes.  ? Cellcept contributing.  ? Need for flex sig. -Suspected GBS. IVIG completed.  -Myasthenia Gravis:  On CellCept.  -History of DVT with IVC filter  -Hypothyroidism -Chronic anemia/thrombocytopenia: Stable. -Sacral wounds:  ? herpetic lesions/HSV.   ZEHR, JESSICA  D.  07/01/2013, 12:36 PM  Pager number [place] Attending  I have also seen and assessed the patient and agree with the above note. She is not as sick as she was in 2014. We will follow and see where things go - may need a repeat flex sig and biopsy to see if she has recurrent CMV.   Gatha Mayer, MD, [facility] [phone] (pager) 07/01/2013 3:51 PM

## 2013-07-01 NOTE — Progress Notes (Signed)
Physical Therapy Session Note  Patient Details  Name: Seniah Lawrence MRN: [id number] Date of Birth: February 24, 1925  Today's Date: 07/01/2013 Time: 1030-1130 Time Calculation (min): 60 min  Short Term Goals: Week 2:  PT Short Term Goal 1 (Week 2): Pt will transfer bed <> wheelchair with mod assist.  PT Short Term Goal 2 (Week 2): Pt will sit <> stand with mod assist PT Short Term Goal 3 (Week 2): Pt will tolerate 1 min continuous static standing with mod assist and RW.   Skilled Therapeutic Interventions/Progress Updates:    Goal of session to get pt out of room for improved mood and to work towards tolerance of upright position. Pt engaged in functional activity of watering plants seated fully upright in w/c, pt intermittently (~every 2 min) had to be reclined in chair for rest breaks and to decrease lightheadedness. Pt then engaged in seated LE exercises with partially reclined position: long arc quads resisted (3# weights) 2 x 10 reps, ankle pumps 2 x 20 reps, marching 2 x 10 reps with extended rest inbetween. Pt requested return to bed, unable to sit up for lunch. Scoot pivot transfer max assist (+2 for safety). Sit >supine +2 assist. PT encouraged PO intake during session.  Orthostatic BPs  Reclined in w/c 104/73  Sitting Upright 92/65  Sitting after 2 min 85/60  Reclined in w/c at end of session 101/71    Therapy Documentation Precautions:  Precautions Precautions: Fall Restrictions Weight Bearing Restrictions: No Pain: Pain Assessment Pain Assessment: No/denies pain  See FIM for current functional status  Therapy/Group: Individual Therapy  Lahoma Rocker 07/01/2013, 12:27 PM

## 2013-07-01 NOTE — Progress Notes (Signed)
Physical Therapy Note  Patient Details  Name: [name] MRN: [id number] Date of Birth: [birth year] Today's Date: 07/01/2013  Pt missed 30 minutes of skilled physical therapy, as pt declined due to nausea. Nsg aware and had administered medication just prior to therapist attempting to initiate session.  Stefano Gaul 07/01/2013, 2:49 PM

## 2013-07-01 NOTE — Progress Notes (Signed)
Physical Therapy Session Note  Patient Details  Name: [name] MRN: [id number] Date of Birth: [birth year]  Today's Date: 07/01/2013 Time: 1330-1410 Time Calculation (min): 40 min  Skilled Therapeutic Interventions/Progress Updates:  1:1. Pt received semi-reclined in bed, agreeable to therapy in bed. Pt declined to get OOB secondary to fatigue. Focus this session on supine therex for B LE strength/ROM. PROM to B gastroc/soleus complex and assisted therex for 2x10 reps of assisted ankle pumps, quad sets, glute sets, heel presses and add squeezes. GI PA-C present during tx session, referred questions to RN, Dolores Lory. Pt req max A x1person for t/f sup<>sit EOB, BP obtained in sitting 60/45 and HR 80bpm. RN noted that abdominal binder ordered to address orthostatic hypotension. Pt req Ax2persons for repositioning in bed. Pt semi-reclined in bed at end of session w/ all needs in reach, 4 rails up and bed alarm on.   Therapy Documentation Precautions:  Precautions Precautions: Fall Restrictions Weight Bearing Restrictions: No  See FIM for current functional status  Therapy/Group: Individual Therapy  Gilmore Laroche 07/01/2013, 2:13 PM

## 2013-07-01 NOTE — Progress Notes (Signed)
Orthopedic Tech Progress Note Patient Details:  [name] 01-16-1925 [id number]  Ortho Devices Type of Ortho Device: Abdominal binder Ortho Device/Splint Location: abdomen Ortho Device/Splint Interventions: Ordered Abdominal binder left with pt in room ; rn notified ; viewed order from doctor's order list  [name] 07/01/2013, 2:55 PM

## 2013-07-02 ENCOUNTER — Inpatient Hospital Stay (HOSPITAL_COMMUNITY): Payer: Medicare Other

## 2013-07-02 ENCOUNTER — Inpatient Hospital Stay (HOSPITAL_COMMUNITY): Payer: Medicare Other | Admitting: Occupational Therapy

## 2013-07-02 ENCOUNTER — Inpatient Hospital Stay (HOSPITAL_COMMUNITY): Payer: Medicare Other | Admitting: Physical Therapy

## 2013-07-02 LAB — COMPREHENSIVE METABOLIC PANEL
ALBUMIN: 1.1 g/dL — AB (ref 3.5–5.2)
ALT: 10 U/L (ref 0–35)
AST: 13 U/L (ref 0–37)
Alkaline Phosphatase: 85 U/L (ref 39–117)
BUN: 36 mg/dL — ABNORMAL HIGH (ref 6–23)
CALCIUM: 7.1 mg/dL — AB (ref 8.4–10.5)
CO2: 12 meq/L — AB (ref 19–32)
Chloride: 108 mEq/L (ref 96–112)
Creatinine, Ser: 1.28 mg/dL — ABNORMAL HIGH (ref 0.50–1.10)
GFR calc Af Amer: 42 mL/min — ABNORMAL LOW (ref >90)
GFR, EST NON AFRICAN AMERICAN: 36 mL/min — AB (ref >90)
Glucose, Bld: 80 mg/dL (ref 70–99)
Potassium: 4.5 mEq/L (ref 3.7–5.3)
SODIUM: 133 meq/L — AB (ref 137–147)
TOTAL PROTEIN: 3.5 g/dL — AB (ref 6.0–8.3)
Total Bilirubin: 0.3 mg/dL (ref 0.3–1.2)

## 2013-07-02 MED ORDER — COLESTIPOL HCL 1 G PO TABS
2.0000 g | ORAL_TABLET | Freq: Two times a day (BID) | ORAL | Status: DC
  Administered 2013-07-02: 2 g via ORAL
  Filled 2013-07-02 (×4): qty 2

## 2013-07-02 MED ORDER — VITAL 1.0 CAL PO LIQD
237.0000 mL | ORAL | Status: DC
  Administered 2013-07-04: 237 mL via ORAL
  Filled 2013-07-02 (×7): qty 237

## 2013-07-02 MED ORDER — WHITE PETROLATUM GEL
Status: AC
  Administered 2013-07-02: 0.2
  Filled 2013-07-02: qty 5

## 2013-07-02 NOTE — Progress Notes (Signed)
Subjective/Complaints: Having vivid, bad dreams at night. Wakes up disoriented. Still with poor appetite, nausea, loose stool.   A 12 point review of systems has been performed and if not noted above is otherwise negative.   Objective: Vital Signs: Blood pressure 106/64, pulse 78, temperature 97.7 F (36.5 C), temperature source Oral, resp. rate 17, height 5\' 4"  (1.626 m), weight 64.3 kg (141 lb 12.1 oz), SpO2 98.00%. No results found. No results found for this basename: WBC, HGB, HCT, PLT,  in the last 72 hours  Recent Labs  07/02/13 0539  NA 133*  K 4.5  CL 108  GLUCOSE 80  BUN 36*  CREATININE 1.28*  CALCIUM 7.1*   CBG (last 3)  No results found for this basename: GLUCAP,  in the last 72 hours  Wt Readings from Last 3 Encounters:  06/26/13 64.3 kg (141 lb 12.1 oz)  06/17/13 66.9 kg (147 lb 7.8 oz)  05/31/13 70.761 kg (156 lb)    Physical Exam:  Constitutional: She is oriented to person, place. Room smells of stool Frail 80 year old female.  HENT:  Head: Normocephalic.  Eyes: EOM are normal.  Neck: Normal range of motion. Neck supple. No thyromegaly present.  Cardiovascular: Normal rate and regular rhythm. No murmurs. No gallops  Respiratory: Effort normal and breath sounds normal. No respiratory distress. No wheezes, rales  GI: Soft. Bowel sounds are normal. She exhibits no distension. Rectal tube still with soft, liquid stool Neurological: She is alert and oriented to place. Slow to arouse this am.follows all simple commands  Follows simple commands. UE 4-prox with deltoid, bicep to 4 distally. LE's 1 to 1+ with LEFt HF (pain component), RIGHT HF 3. KE 2- left, 2+ right, ankles 3/5. Decreased LT at both feet ,no ptosis, no dsyarthria, CN exam intact  Skin: stable gluteal/perineal/waist line with a few patches of healing round lesions. Appear to be resolving herpetic lesions.  Marland Kitchen Psychiatric: affect very flat. stilll appears discouraged.       Assessment/Plan: 1. Functional deficits secondary to Guillain Barre Syndrome which require 3+ hours per day of interdisciplinary therapy in a comprehensive inpatient rehab setting. Physiatrist is providing close team supervision and 24 hour management of active medical problems listed below. Physiatrist and rehab team continue to assess barriers to discharge/monitor patient progress toward functional and medical goals.  May have to look at another venue for care. Given ongoing fatigue, bowel issues, etc---having difficulties tolerating therapy at this level, and we don't seem to be making any progress.  FIM: FIM - Bathing Bathing Steps Patient Completed: Chest;Right Arm;Left Arm;Abdomen;Right upper leg;Left upper leg Bathing: 3: Mod-Patient completes 5-7 25f 10 parts or 50-74%  FIM - Upper Body Dressing/Undressing Upper body dressing/undressing steps patient completed: Thread/unthread right sleeve of pullover shirt/dresss;Thread/unthread left sleeve of pullover shirt/dress;Put head through opening of pull over shirt/dress Upper body dressing/undressing: 4: Min-Patient completed 75 plus % of tasks FIM - Lower Body Dressing/Undressing Lower body dressing/undressing steps patient completed: Don/Doff right sock Lower body dressing/undressing: 1: Total-Patient completed less than 25% of tasks  FIM - Toileting Toileting steps completed by patient: Performs perineal hygiene Toileting Assistive Devices: Grab bar or rail for support Toileting: 3: Mod-Patient completed 2 of 3 steps  FIM - Radio producer Devices: Elevated toilet seat Toilet Transfers: 3-To toilet/BSC: Mod A (lift or lower assist);3-From toilet/BSC: Mod A (lift or lower assist)  FIM - Bed/Chair Transfer Bed/Chair Transfer Assistive Devices: Bed rails;HOB elevated Bed/Chair Transfer: 1: Two helpers  FIM - Locomotion: Wheelchair Locomotion: Wheelchair: 1: Travels less than 50 ft with minimal  assistance (Pt.>75%) FIM - Locomotion: Ambulation Locomotion: Ambulation Assistive Devices: Administrator Ambulation/Gait Assistance: 1: +2 Total assist Locomotion: Ambulation: 1: Two helpers  Comprehension Comprehension Mode: Auditory Comprehension: 5-Understands complex 90% of the time/Cues < 10% of the time  Expression Expression Mode: Verbal Expression: 5-Expresses complex 90% of the time/cues < 10% of the time  Social Interaction Social Interaction: 4-Interacts appropriately 75 - 89% of the time - Needs redirection for appropriate language or to initiate interaction.  Problem Solving Problem Solving: 3-Solves basic 50 - 74% of the time/requires cueing 25 - 49% of the time  Memory Memory: 5-Recognizes or recalls 90% of the time/requires cueing < 10% of the time Medical Problem List and Plan:  1. Suspect GBS. (? Myasthenia gravis). IVIG completed. Continue CellCept  2. DVT Prophylaxis/Anticoagulation: History of DVT with IVC filter  3. Pain Management: Hydrocodone as needed. Monitor with increased mobility  -added lidoderm patch for left low back/hip pain---suspect pain is from lumbar spondylosis/referral  3. Mood/anxiety/depression: Wellbutrin 150 mg daily, Xanax 0.25 mg twice daily as needed and 1 mg each bedtime as needed   --seems to be having increased depressions with flat affect, loss of energy, poor appetite, dreams of dying--would like to get neuropsych's input  4. Neuropsych: This patient is capable of making decisions on her own behalf.   5. Hypothyroidism. Synthroid  6. Hyperlipidemia. Lipitor  7. Chronic anemia/thrombocytopenia. Latest hemoglobin 8.6 and platelet count 84,000 improved from 67,000.   iron supplement held.  followup CBC  8. Sacral wounds. Appreciate WOC input and recs -  likely herpetic lesions/ HSV   -air mattress  9. Chronic loose stool: more severe during this hospitalization   -probiotic  -imodium 4mg  qid  -rectal tube out  -stopped iron  and multivit due to nausea  -c diff negative  -continue fiber. Questran changed to colestid to see if she more reliably takes  -GI consult appreciated. ?flex-sig---to r/o CMV colitis  -cellcept also likely contributing  -not sure what else to add at this point   10. FEN  -continue IVF at 50cc /hr            -added megace for appetite, encouraged pt to push po intake  -labs ok today   LOS (Days) 13 A FACE TO FACE EVALUATION WAS PERFORMED  Etheridge Geil T 07/02/2013 7:48 AM

## 2013-07-02 NOTE — Progress Notes (Signed)
[facility] Progress Note  Subjective:  Is very tired/wiped out today and could not participate in OT.  Says that she was confused on what day it was.  Still has light brown liquid stool in rectal tube.  Complains of nausea.  Encouraged her to eat/drink and make sure that she is taking her medications.  Objective:  Vital signs in last 24 hours: Temp:  [97.7 F (36.5 C)] 97.7 F (36.5 C) (02/10 0511) Pulse Rate:  [69-80] 78 (02/10 0511) Resp:  [17] 17 (02/10 0511) BP: (65-106)/(48-64) 106/64 mmHg (02/10 0511) SpO2:  [98 %-100 %] 98 % (02/10 0511) Last BM Date: 07/01/13 General:  Alert, elderly, in NAD Heart:  Regular rate and rhythm; no murmurs Pulm:  CTAB.  No W/R/R. Abdomen:  Soft, non-distended.  BS present.  Non-tender.  Extremities:  Trace edema in B/L LE's. Psych:  Alert and cooperative.  Flate affect.  Intake/Output from previous day: 02/09 0701 - 02/10 0700 In: 800 [P.O.:200; I.V.:600] Out: 900 [Stool:900] Intake/Output this shift: Total I/O In: 120 [P.O.:120] Out: -   BMET  Recent Labs  07/02/13 0539  NA 133*  K 4.5  CL 108  CO2 12*  GLUCOSE 80  BUN 36*  CREATININE 1.28*  CALCIUM 7.1*   LFT  Recent Labs  07/02/13 0539  PROT 3.5*  ALBUMIN 1.1*  AST 13  ALT 10  ALKPHOS 85  BILITOT 0.3   Assessment / Plan: -Diarrhea. History of CMV colitis s/p treatment with Ganciclovir. Having watery stools (just under one liter per day); has rectal tube. Questran changed to colestid 1 gm (pill form) BID yesterday. Continue lomotil prn, probiotic, and fiber supplement. Lactose free diet. Could consider trial of pancreatic enzymes. ? Cellcept contributing. ? Need for flex sig.  Would give her a couple of days with current treatment.  -Suspected GBS. IVIG completed.  -Myasthenia Gravis: On CellCept.  -History of DVT with IVC filter  -Hypothyroidism  -Chronic anemia/thrombocytopenia: Stable.  -Sacral wounds: ? herpetic lesions/HSV.   **? Goals of  care/palliative consult if not already done.  Looks like family meeting is planned for Thursday afternoon.    LOS: 13 days   ZEHR, JESSICA D.  07/02/2013, 9:42 AM  Pager number [place] Attending  I have also seen and assessed the patient and agree with the above note. Looks more frail today. Diarrhea persists. Agree w/ change to colestid - have made it 2 g bid.  Daughter indicates that patient has had loose stools since last year's CMV colitis. 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 Has been on CellCept since 1990's so that probably not cause of diarrhea.  I asked the patient if she thought a sigmoidoscopy was something she would be willing to undergo and she said "I will leave that to you".  Will f/u in 2 days. Continue current Tx for now and do not feel that sigmoidoscopy is urgent/essential and await goals of care meeting.  Gatha Mayer, MD, [facility] [phone] (pager) 07/02/2013 5:00 PM

## 2013-07-02 NOTE — Progress Notes (Signed)
Social Work Patient ID: [name], female   DOB: 1925-03-24, 80 y.o.   MRN: [id number]  Lauren Pall, LCSW Social Worker Signed  Patient Care Conference Service date: 07/02/2013 6:26 PM  Inpatient RehabilitationTeam Conference and Plan of Care Update Date: 07/02/2013   Time: 2:15 PM     Patient Name: [name]       Medical Record Number: [id number]   Date of Birth: Apr 28, 1925 Sex: Female         Room/Bed: 4M08C/4M08C-01 Payor Info: Payor: MEDICARE / Plan: MEDICARE PART A AND B / Product Type: *No Product type* /   Admitting Diagnosis: MG   Admit Date/Time:  06/19/2013  3:07 PM Admission Comments: No comment available   Primary Diagnosis:  GBS (Guillain-Barre syndrome) Principal Problem: GBS (Guillain-Barre syndrome)    Patient Active Problem List     Diagnosis  Date Noted   .  Diarrhea  07/01/2013   .  GBS (Guillain-Barre syndrome)  06/19/2013   .  Weakness  06/11/2013   .  Proximal muscle weakness  06/11/2013   .  Numbness of legs  06/11/2013   .  Cardiomyopathy  04/22/2013   .  Physical deconditioning  04/02/2013   .  Pedal edema  04/01/2013   .  Chronic systolic heart failure  [id number]   .  Pulmonary hypertension  03/27/2013   .  Hypokalemia  03/27/2013   .  SBO (small bowel obstruction)  03/25/2013   .  Ulcerative colitis     .  Protein-calorie malnutrition, severe  03/24/2013   .  Anemia of chronic disease  03/23/2013   .  Acute respiratory failure with hypoxia  03/23/2013   .  Atrial fibrillation with RVR  03/22/2013   .  N&V (nausea and vomiting)  03/22/2013   .  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  Incisional hernia, incarcerated s/p lap repair 03/25/2013  03/22/2013   .  NSTEMI (non-ST elevated myocardial infarction)  03/22/2013   .  Acute renal failure  12/07/2012   .  ANXIETY  12/26/2007   .  HYPOTHYROIDISM  11/10/2006     Expected Discharge Date: Expected Discharge Date:  (SNF)  Team Members Present: Physician leading  conference: Dr. Alger Simons Social Worker Present: Lauren Pall, LCSW Nurse Present: Dorien Chihuahua, RN PT Present: Canary Brim, Rayburn Ma, PT OT Present: Chrys Racer, OT;Kris Gellert, OT;Frank Loleta Rose, OT PPS Coordinator present : Daiva Nakayama, RN, CRRN        Current Status/Progress  Goal  Weekly Team Focus   Medical     continued stooling, ?GI work up, ?CMV colitis  see prior,  setting goals of care, GI work up   Bowel/Bladder     offering toileting every 3 hours for voiding, up to BSC/BR/bedpan at night, depends in use  continent of bladder with  toileting and modified independence   Continue to encourage pt to take immodium, and probiotic medication   Swallow/Nutrition/ Hydration     very poor intake of meals and oral fluids, on megace 400mg  po BID , 1/2 NS at 50cc/hr continously, encouraged fluids  50% of every meal and 1 liter of oral fluids in 24 hours     ADL's     Max A for lower body bathing and dressing (B & D), Min A for upper B & D, Max A - Mod A for transfers  Mod A bathing, Min A dressing, Min A transfers and dynamic standing balance, Supervision  for dynamic sitting balance  Improved activity tolerance, sit <> stand, dynamic standing balnce, transfers   Mobility     Max/+2 assist  min/mod assist transfers, no ambulation goals anymore. Anticipate goals will need to be downgraded.  OOB tolerance, decreasing orthostasis, pressure relief, strength   Communication            Safety/Cognition/ Behavioral Observations    air mattress overlay, side rails up x 4 for overlay, dizzy with sitting even with abdominal binder and TEDS in place  no falls with injury     Pain     Intermittent complaint of discomfort to sacrum. Refusing any medication at this time.  <3  Continue to offer Tylenol 650mg  prior to initial therapy session   Skin     multiple stage 2 areas to sacral /buttocks/perineanun and right outer labia, alleyvn foam dressings in place, change every 3 days  and prn, Barrier cream to outer labia and perineum , air mattress overlay  No additional skin breakdown  Continue with airmattress overlay. Encourage increase po intake to promote healing    Rehab Goals Patient on target to meet rehab goals: No Rehab Goals Revised: medical issues continue to impede progress *See Care Plan and progress notes for long and short-term goals.    Barriers to Discharge:  see prior      Possible Resolutions to Barriers:    ?palliative care, workking toward lower level of care      Discharge Planning/Teaching Needs:    to meet with daughters on 2/12 to determine if home with family providing significant 24/ 7 care vs. change in plan to SNF   skin care to daughter to manage at home.    Team Discussion:  Still not tolerating theapy schedule.  Possible GI w/u.  SW to meet with daughters to discuss d/c plan. Poor po intake continues   Revisions to Treatment Plan:    Decrease schedule to qd.  Palliative Care  and Neuropsych consults in place.    Continued Need for Acute Rehabilitation Level of Care: The patient requires daily medical management by a physician with specialized training in physical medicine and rehabilitation for the following conditions: Daily direction of a multidisciplinary physical rehabilitation program to ensure safe treatment while eliciting the highest outcome that is of practical value to the patient.: Yes Daily medical management of patient stability for increased activity during participation in an intensive rehabilitation regime.: Yes Daily analysis of laboratory values and/or radiology reports with any subsequent need for medication adjustment of medical intervention for : Neurological problems;Marda Stalker 07/02/2013, 6:27 PM         Lauren Pall, LCSW Social Worker Signed  Patient Care Conference Service date: 06/27/2013 10:47 AM  Inpatient RehabilitationTeam Conference and Plan of Care Update Date: 06/25/2013   Time: 2:30 PM      Patient Name: [name]       Medical Record Number: [id number]   Date of Birth: 25-Feb-1925 Sex: Female         Room/Bed: 4M08C/4M08C-01 Payor Info: Payor: MEDICARE / Plan: MEDICARE PART A AND B / Product Type: *No Product type* /   Admitting Diagnosis: MG   Admit Date/Time:  06/19/2013  3:07 PM Admission Comments: No comment available   Primary Diagnosis:  GBS (Guillain-Barre syndrome) Principal Problem: GBS (Guillain-Barre syndrome)    Patient Active Problem List     Diagnosis  Date Noted   .  GBS (Guillain-Barre syndrome)  06/19/2013   .  Weakness  06/11/2013   .  Proximal muscle weakness  06/11/2013   .  Numbness of legs  06/11/2013   .  Cardiomyopathy  04/22/2013   .  Physical deconditioning  04/02/2013   .  Pedal edema  04/01/2013   .  Chronic systolic heart failure  00/92/3300   .  Pulmonary hypertension  03/27/2013   .  Hypokalemia  03/27/2013   .  SBO (small bowel obstruction)  03/25/2013   .  Ulcerative colitis     .  Protein-calorie malnutrition, severe  03/24/2013   .  Anemia of chronic disease  03/23/2013   .  Acute respiratory failure with hypoxia  03/23/2013   .  Atrial fibrillation with RVR  03/22/2013   .  N&V (nausea and vomiting)  03/22/2013   .  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  Incisional hernia, incarcerated s/p lap repair 03/25/2013  03/22/2013   .  NSTEMI (non-ST elevated myocardial infarction)  03/22/2013   .  Acute renal failure  12/07/2012   .  ANXIETY  12/26/2007   .  HYPOTHYROIDISM  11/10/2006     Expected Discharge Date: Expected Discharge Date:  (2 weeks vs SNF)  Team Members Present: Physician leading conference: Dr. Alger Simons Social Worker Present: Lauren Pall, LCSW Nurse Present: Janyth Pupa, RN PT Present: Canary Brim, Rayburn Ma, PT OT Present: Chrys Racer, Olena Leatherwood, OT PPS Coordinator present : Daiva Nakayama, RN, CRRN;Becky Alwyn Ren, PT        Current Status/Progress   Goal  Weekly Team Focus   Medical     guillain barre, generalized weakness. continued GI issues, wound care  improve exercise tolerance,   diarrhea, nausea mgt   Bowel/Bladder     Foley tube with black, liquid stool. LBM 06/24/13  Managed bowel programa  Assess effectiveness of Immodium, proboitic medication   Swallow/Nutrition/ Hydration            ADL's     Max A for B & D and transfers  Mod A bathing, Min A dressing, Min A transfers and dynamic standing balance, Supervision for dynamic sitting balance  Endurance, transfers, sit <> stand, dynamic sitting/standing balance   Mobility     Inconsistent, max /+2 assist  Min assist transfers, mod assist ambulation  OOB tolerance, transfers, pressure relief, sit <> stands, activity tolerance   Communication            Safety/Cognition/ Behavioral Observations           Pain     No c/o pain  <3  Monitor for nonverbal indicators of pain   Skin     Stage 3 to sacrum x 2 with allevyn dressing intact. Skin tear to labia, EPBC and allevyn dressing applied for protection  No additional skin breakdown  Routine turn q 2hrs     Rehab Goals Patient on target to meet rehab goals: Yes *See Care Plan and progress notes for long and short-term goals.    Barriers to Discharge:  profound weakness, ongoing diarrhea, wound care      Possible Resolutions to Barriers:    full time supervision and assistance at home      Discharge Planning/Teaching Needs:    home with family providing 24/7 assistance vs possible SNF      Team Discussion:    Syncopal episode today - starting IVF.  Currently max - mod assist overall with min/ mod assist goals.   Concern that family may not  be able to meet these needs - SW to follow up.  Also anticipate will need pressure relief education close management at home.   Revisions to Treatment Plan:    None    Continued Need for Acute Rehabilitation Level of Care: The patient requires daily medical management by a  physician with specialized training in physical medicine and rehabilitation for the following conditions: Daily direction of a multidisciplinary physical rehabilitation program to ensure safe treatment while eliciting the highest outcome that is of practical value to the patient.: Yes Daily medical management of patient stability for increased activity during participation in an intensive rehabilitation regime.: Yes Daily analysis of laboratory values and/or radiology reports with any subsequent need for medication adjustment of medical intervention for : Neurological problems;Other  Caitland Porchia, [place] 06/27/2013, 10:47 AM

## 2013-07-02 NOTE — Progress Notes (Signed)
Thank you for consulting the Palliative Medicine Team at [facility] to meet your patient's and family's needs.   The reason that you asked [place] to see your patient is  For GOc  We have scheduled your patient for a meeting: 2/12 5 pm at family request, want both sisterd  The Surrogate decision make is: Daughter, Shanda Bumps information: [phone]  Other family members that need to be present:  Sister at Eva's discretion    Your patient is able/unable to participate: some  Additional Narrative:   Alice Vitelli L. Lovena Le, MD MBA The Palliative Medicine Team at [facility] Phone: [phone] Pager: [phone]

## 2013-07-02 NOTE — Progress Notes (Signed)
Called to room by physical therapist Orvil Feil. Patient sitting at edge of bed, complained of being dizzy, not feeling well. Blood pressure 70-80's /50's. Last pressure sitting 81/50 heart rate 88. Paged Marlowe Shores, PA, made aware of blood pressure readings and complaints, instructed to let therapy know to work patient to tolerance, no new orders given at this time. Roberts-Von[place], Jamarkis Branam Selinda Eon

## 2013-07-02 NOTE — Progress Notes (Signed)
Occupational Therapy Session Note  Patient Details  Name: [name] MRN: [id number] Date of Birth: 05/25/1924  Today's Date: 07/02/2013 Time: 3893-7342 Time Calculation (min): 44 min    Skilled Therapeutic Interventions/Progress Updates:    Pt performed rolling in the bed side to side as therapist assisted with donning pants over both LEs and pulling over her hips.  Pt's BP checked with HOB elevated at 87/61 93/63.  Applied abdominal binder as well in supine rolling.  Pt able to roll using the side rails with min assist to both sides.  Needed mod assist to transition to sitting EOB.  Pt was able to maintain static sitting with close supervision.  BP checked again at 123/90.  Pt still reports dizziness with sitting however.  Performed squat pivot transfer to tilt in space wheelchair with max assist.  Needed second person for partial stand in order for therapist to adjust her wheelchair cushion which slid when she was performing the transfer.  Pt tilted back in the wheelchair with waist belt in place.  BP checked again at 98/72.  Pt left in wheelchair beside of bed with call button in her lap.  Nursing notified of PB readings.  Therapy Documentation Precautions:  Precautions Precautions: Fall Precaution Comments: increased dizziness with sitting Restrictions Weight Bearing Restrictions: No  Vital Signs: Therapy Vitals Temp: 97.5 F (36.4 C) Temp src: Oral Pulse Rate: 79 Resp: 18 BP: 98/72 mmHg (in tilt wheelchair with OT) Patient Position, if appropriate: Sitting Oxygen Therapy SpO2: 95 % O2 Device: None (Room air) Pain: Pain Assessment Pain Assessment: No/denies pain   See FIM for current functional status  Therapy/Group: Individual Therapy  Tamia Dial OTR/L 07/02/2013, 3:51 PM

## 2013-07-02 NOTE — Progress Notes (Signed)
Physical Therapy Session Note  Patient Details  Name: [name] MRN: [id number] Date of Birth: 11-Aug-1924  Today's Date: 07/02/2013 Time: 0730-0825 Time Calculation (min): 55 min  Skilled Therapeutic Interventions/Progress Updates:    Pt supine in bed upon arrival, agreeable to therapy but admits she does not feel well. Reports she often awakes slightly confused and uncertain of where she is - reports having bad dreams. Pt required mod assist for rolling to don abdominal binder, nurse called in to assess wounds and to assist with clean-up from leaking rectal tube. HOB raised slowly to allow pt to slowly come to upright without reports of dizziness however upon reaching EOB pt with c/o extreme lightheadedness. (Supine  BP 115/74; Sitting EOB 77/50; Sitting after 8 min 81/50 with TEDS and abdominal binder on). Pt tolerated sitting EOB x 13 min with max assist due to posterior lean. Pt engaged in anterior weightshifting task but then began asking to return to bed, did not feel she was able to transfer to wheelchair.   Therapy Documentation Precautions:  Precautions Precautions: Fall Restrictions Weight Bearing Restrictions: No Pain: Pain Assessment Pain Assessment: 0-10 Pain Score: 0-No pain  See FIM for current functional status  Therapy/Group: Individual Therapy  Lahoma Rocker 07/02/2013, 12:25 PM

## 2013-07-02 NOTE — Progress Notes (Signed)
Social Work Patient ID: [name], female   DOB: 07/14/1924, 80 y.o.   MRN: [id number]   Spoke with daughter, [name], yesterday afternoon at length to discuss pt's care needs and d/c plans.  Daughter understands that pt will require much more assistance with this discharge and she does not feel that her sister and brother-in-law can provide this.  Reports that "... It will just all fall on me and I have health issues myself".  [name] with many concerns about her sister and brother-in-law, feeling they "... Aren't being realistic about the situation."  Stressed to this daughter that she must address her concerns directly with her sister and that we need to establish a d/c plan moving forward of either home with 24/7 care or SNF.  [name] requests my assistance with speaking with her sister.  Have scheduled meeting with the two of them on Thursday afternoon to make a plan.  Will keep team updated.  Will go ahead and prep FL2.   Raquel Sayres, LCSW

## 2013-07-02 NOTE — Progress Notes (Signed)
Occupational Therapy Session Note  Patient Details  Name: Jackelyn Illingworth MRN: [id number] Date of Birth: July 13, 1924  Today's Date: 07/02/2013 Time: 1032-1040 Time Calculation (min): 8 min  Short Term Goals: Week 2:  OT Short Term Goal 1 (Week 2): Patient will complete lower body bathing and dressing with max assist OT Short Term Goal 2 (Week 2): Patient will complete toilet transfer with mod assist OT Short Term Goal 3 (Week 2): Patient will complete upper body bathing and dressing with supervision and setup OT Short Term Goal 4 (Week 2): Patient will demo ability to perform sit <> stand during ADL to pull-up underwear and/or pants with min assist  Skilled Therapeutic Interventions/Progress Updates: ADL-retraining with focus on improved activity tolerance, transfers, sitting/standing balance.   Patient received supine in bed, mildly confused and reporting scissors were placed in her blanket.   Patient reoriented quickly and was agreeable to continued treatment and plan to assist with transfer from bed to w/c in prep for ADL at sink side.   Patient was assisted with bed mobility from supine to sitting at edge of bed with mod assist to manage feet and lift upper body however patient was unable to maintain unsupported static sitting this date or tolerate assist with donning abdominal binder. Patient terminated session, closing her eyes and leaning to recover to supine.   Dynamap unreliable for vitals assessment this session.   RN notified of patient's rare termination of session due to fatigue.   Therapy Documentation Precautions:  Precautions Precautions: Fall Restrictions Weight Bearing Restrictions: No  General: General Amount of Missed OT Time (min): 52 Minutes  Pain: Pain Assessment Pain Assessment: 0-10 Pain Score: 0-No pain Patients Stated Pain Goal: 3 Multiple Pain Sites: No  ADL: ADL ADL Comments: see FIM  See FIM for current functional status  Therapy/Group: Individual  Therapy  Second session: Time: 0630-1601 Time Calculation (min):  30 min  Pain Assessment: 4/10, low back, repositioned  Skilled Therapeutic Interventions: Therapeutic activity with focus on general strengthening using 1 lb dowel, endurance, w/c > bed transfer, and bed mobility.   Patient received in her w/c, reporting pain with need for pressure relief.   OT reclined w/c and adjusted leg rests to comfort level which allowed patient to participate in UE strengthening using 1 lb dowel with min-mod resistance provided against movements.   Patient performed 40 total repetitions with good focus on task although requiring rest breaks of 1 - 2 minutes between each 10 rep exercises.   Patient requested return to bed due to fatigue and required total assist for squat pivot transfer to her right and recovery to supine in bed.   Patient then performed rolls to both left and right sides with mod assist as instructed to facilitate removal of abdominal binder.   Daughter arrived at end of session and patient was alert and responsive to her arrival.  See FIM for current functional status  Therapy/Group: Individual Therapy  Lashena Signer 07/02/2013, 10:57 AM

## 2013-07-02 NOTE — Patient Care Conference (Signed)
Inpatient RehabilitationTeam Conference and Plan of Care Update Date: 07/02/2013   Time: 2:15 PM    Patient Name: Tijana Walder      Medical Record Number: [id number]  Date of Birth: [birth year] Sex: Female         Room/Bed: 4M08C/4M08C-01 Payor Info: Payor: MEDICARE / Plan: MEDICARE PART A AND B / Product Type: *No Product type* /    Admitting Diagnosis: MG  Admit Date/Time:  06/19/2013  3:07 PM Admission Comments: No comment available   Primary Diagnosis:  GBS (Guillain-Barre syndrome) Principal Problem: GBS (Guillain-Barre syndrome)  Patient Active Problem List   Diagnosis Date Noted  . Diarrhea 07/01/2013  . GBS (Guillain-Barre syndrome) 06/19/2013  . Weakness 06/11/2013  . Proximal muscle weakness 06/11/2013  . Numbness of legs 06/11/2013  . Cardiomyopathy 04/22/2013  . Physical deconditioning 04/02/2013  . Pedal edema 04/01/2013  . Chronic systolic heart failure 48/54/6270  . Pulmonary hypertension 03/27/2013  . Hypokalemia 03/27/2013  . SBO (small bowel obstruction) 03/25/2013  . Ulcerative colitis   . Protein-calorie malnutrition, severe 03/24/2013  . Anemia of chronic disease 03/23/2013  . Acute respiratory failure with hypoxia 03/23/2013  . Atrial fibrillation with RVR 03/22/2013  . N&V (nausea and vomiting) 03/22/2013  . 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 Incisional hernia, incarcerated s/p lap repair 03/25/2013 03/22/2013  . NSTEMI (non-ST elevated myocardial infarction) 03/22/2013  . Acute renal failure 12/07/2012  . ANXIETY 12/26/2007  . HYPOTHYROIDISM 11/10/2006    Expected Discharge Date: Expected Discharge Date:  (SNF)  Team Members Present: Physician leading conference: Dr. Alger Simons Social Worker Present: Lennart Pall, LCSW Nurse Present: Dorien Chihuahua, RN PT Present: Canary Brim, Rayburn Ma, PT OT Present: Chrys Racer, OT;Kris Gellert, OT;Frank Loleta Rose, OT PPS Coordinator present : Daiva Nakayama, RN, CRRN    Current Status/Progress Goal Weekly Team Focus  Medical   continued stooling, ?GI work up, ?CMV colitis  see prior,  setting goals of care, GI work up   Bowel/Bladder   offering toileting every 3 hours for voiding, up to BSC/BR/bedpan at night, depends in use  continent of bladder with  toileting and modified independence   Continue to encourage pt to take immodium, and probiotic medication   Swallow/Nutrition/ Hydration   very poor intake of meals and oral fluids, on megace 400mg  po BID , 1/2 NS at 50cc/hr continously, encouraged fluids  50% of every meal and 1 liter of oral fluids in 24 hours      ADL's   Max A for lower body bathing and dressing (B & D), Min A for upper B & D, Max A - Mod A for transfers  Mod A bathing, Min A dressing, Min A transfers and dynamic standing balance, Supervision for dynamic sitting balance  Improved activity tolerance, sit <> stand, dynamic standing balnce, transfers   Mobility   Max/+2 assist  min/mod assist transfers, no ambulation goals anymore. Anticipate goals will need to be downgraded.  OOB tolerance, decreasing orthostasis, pressure relief, strength   Communication             Safety/Cognition/ Behavioral Observations  air mattress overlay, side rails up x 4 for overlay, dizzy with sitting even with abdominal binder and TEDS in place  no falls with injury      Pain   Intermittent complaint of discomfort to sacrum. Refusing any medication at this time.  <3  Continue to offer Tylenol 650mg  prior to initial therapy session   Skin   multiple stage  2 areas to sacral /buttocks/perineanun and right outer labia, alleyvn foam dressings in place, change every 3 days and prn, Barrier cream to outer labia and perineum , air mattress overlay  No additional skin breakdown  Continue with airmattress overlay. Encourage increase po intake to promote healing    Rehab Goals Patient on target to meet rehab goals: No Rehab Goals Revised: medical issues continue to  impede progress *See Care Plan and progress notes for long and short-term goals.  Barriers to Discharge: see prior    Possible Resolutions to Barriers:  ?palliative care, workking toward lower level of care    Discharge Planning/Teaching Needs:  to meet with daughters on 2/12 to determine if home with family providing significant 24/ 7 care vs. change in plan to SNF  skin care to daughter to manage at home.   Team Discussion: Still not tolerating theapy schedule.  Possible GI w/u.  SW to meet with daughters to discuss d/c plan. Poor po intake continues  Revisions to Treatment Plan:  Decrease schedule to qd.  Palliative Care  and Neuropsych consults in place.   Continued Need for Acute Rehabilitation Level of Care: The patient requires daily medical management by a physician with specialized training in physical medicine and rehabilitation for the following conditions: Daily direction of a multidisciplinary physical rehabilitation program to ensure safe treatment while eliciting the highest outcome that is of practical value to the patient.: Yes Daily medical management of patient stability for increased activity during participation in an intensive rehabilitation regime.: Yes Daily analysis of laboratory values and/or radiology reports with any subsequent need for medication adjustment of medical intervention for : Neurological problems;Other  Huie Ghuman 07/02/2013, 6:27 PM

## 2013-07-02 NOTE — Progress Notes (Signed)
Social Work Patient ID: [name], female   DOB: [birth year], 80 y.o.   MRN: [id number]  Met with pt's daughter following team conference.  Discussed continued concerns of poor ability to tolerate CIR program.  Daughter aware and agreeable with referral to Palliative Care.  Will still plan to meet with daughters on Thursday afternoon to make deifinitive d/c plan.  Daughter, [name], reports that she has "started the discussion" with her sister already and welcoming of input from [place].  Continue to follow.  Akia Desroches, LCSW

## 2013-07-02 NOTE — Plan of Care (Signed)
Problem: RH BLADDER ELIMINATION Goal: RH STG MANAGE BLADDER WITH ASSISTANCE STG Manage Bladder With Mod I Assistance.  Outcome: Not Progressing Uses bedpan at night, max assist by staff

## 2013-07-02 NOTE — Progress Notes (Signed)
NUTRITION FOLLOW UP  Intervention:   1.  Supplements; Vital 1.0 PO once daily as oral feeding supplement.  2.  Continue MVI daily. 3.  General healthful diet; encourage intake of foods and beverages as able.  RD to follow and assess for nutritional adequacy.   Nutrition Dx: Inadequate oral intake related to poor appetite AEB PO 50% of meal, wt loss  Monitor:   1.  Food/Beverage; pt meeting >/=90% estimated needs with tolerance.  Not met.  Interventions in place. 2.  Wt/wt change; monitor trends  Ongoing.   Assessment:   Patient is an 80 y.o. Female with PMH of myasthenia gravis on cellcept, uterine cancer, DVT s/p IVC filter because of GIB, hypothyroidism, NICM, PAF, anxiety, anemia, and vitamin B12 deficiency. Patient was admitted to [facility] ED on 1/20 complaining of progressive muscle weakness over the previous 3 days. Patient was transferred to [facility] later on 1/20. Work-up revealed myasthenia gravis causing lower extremity fatigability versus possible GBS. Pt admitted to inpatient rehab on 1/28.  Pt with several wounds to sacrum and frequent watery diarrhea.  Now with flexiseal.  PO intake 0-30% of meals.  Pt not taking supplements.  Per RN, pt has had hot tea, tea, and ginger ale today. Continues Megace, however no significant improvement in intake.  Pt continues with increased output from flexiseal.  Seen by GI who recommends continuing probiotic, fiber, and lactose free diet.   RD will order Vital PO for pt due to malabsorption and impaired GI function.  Discussed with RN.  With inability to improve PO despite encouragement, appetite stimulant, and multiple supplements, enteral nutrition may be appropriate.  RD notes palliative consult has been requested and family meeting to discuss disposition.  RD will follow for appropriate interventions.    Height: Ht Readings from Last 1 Encounters:  06/19/13 _0  (1.626 m)    Weight Status:   Wt Readings from Last 1 Encounters:   06/26/13 141 lb 12.1 oz (64.3 kg)    Re-estimated needs:  Kcal: 1700-1900 Protein: 85-95g Fluid: ~2.0 L/day  Skin: Stage 2 pressure ulcer, generalized edema  Diet Order: Dysphagia 2, thin   Intake/Output Summary (Last 24 hours) at 07/02/13 1501 Last data filed at 07/02/13 1300  Gross per 24 hour  Intake    890 ml  Output    900 ml  Net    -10 ml    Last BM: 2/4, diarrhea via flexiseal   Labs:   Recent Labs Lab 06/27/13 0515 06/29/13 0445 07/02/13 0539  NA 134* 132* 133*  K 4.3 4.1 4.5  CL 106 107 108  CO2 16* 14* 12*  BUN 36* 33* 36*  CREATININE 1.39* 1.16* 1.28*  CALCIUM 7.0* 6.8* 7.1*  GLUCOSE 93 107* 80    CBG (last 3)  No results found for this basename: GLUCAP,  in the last 72 hours  Scheduled Meds: . antiseptic oral rinse  15 mL Mouth Rinse BID  . aspirin  81 mg Oral Daily  . atorvastatin  20 mg Oral Q supper  . buPROPion  150 mg Oral Q breakfast  . colestipol  1 g Oral BID  . feeding supplement (RESOURCE BREEZE)  1 Container Oral BID BM  . lactobacillus  1 g Oral TID WC  . lidocaine  1 patch Transdermal Q24H  . megestrol  400 mg Oral BID  . mycophenolate  1,000 mg Oral BID  . pantoprazole  40 mg Oral Daily  . polycarbophil  625 mg Oral  BID  . potassium chloride  20 mEq Oral BID  . prochlorperazine  10 mg Oral TID    Continuous Infusions: . sodium chloride 50 mL/hr at 07/02/13 1230    Brynda Greathouse, MS RD LDN Clinical Inpatient Dietitian Pager: [phone] Weekend/After hours pager: [phone]

## 2013-07-03 ENCOUNTER — Inpatient Hospital Stay (HOSPITAL_COMMUNITY): Payer: Medicare Other

## 2013-07-03 ENCOUNTER — Inpatient Hospital Stay (HOSPITAL_COMMUNITY): Payer: Medicare Other | Admitting: Physical Therapy

## 2013-07-03 DIAGNOSIS — G610 Guillain-Barre syndrome: Secondary | ICD-10-CM

## 2013-07-03 MED ORDER — CHOLESTYRAMINE 4 G PO PACK
4.0000 g | PACK | ORAL | Status: DC
  Administered 2013-07-03 – 2013-07-04 (×2): 4 g via ORAL
  Filled 2013-07-03 (×4): qty 1

## 2013-07-03 NOTE — Plan of Care (Signed)
Problem: RH BOWEL ELIMINATION Goal: RH STG MANAGE BOWEL WITH ASSISTANCE STG Manage Bowel with Max Assistance.  Outcome: Not Progressing Requires total assist with flexaseal at this time  Problem: RH BLADDER ELIMINATION Goal: RH STG MANAGE BLADDER WITH ASSISTANCE STG Manage Bladder With Mod I Assistance.  Outcome: Not Progressing Patient unable to void for >8 hours-required I/O cath

## 2013-07-03 NOTE — Progress Notes (Signed)
Patient's urine is brown and malodorous. Patient afebrile, but noted more confusion today. Paged Dan A. PA-C on call for a urinalysis and culture order. Telephone order placed. RN to continue to monitor, patient stable.

## 2013-07-03 NOTE — Progress Notes (Signed)
Subjective/Complaints: Slow to arouse. Somewhat confused this am. Better when re-oriented. Still feels tired, diarrhea persists  A 12 point review of systems has been performed and if not noted above is otherwise negative.   Objective: Vital Signs: Blood pressure 99/63, pulse 77, temperature 98.6 F (37 C), temperature source Oral, resp. rate 17, height 5\' 4"  (1.626 m), weight 64.3 kg (141 lb 12.1 oz), SpO2 99.00%. No results found. No results found for this basename: WBC, HGB, HCT, PLT,  in the last 72 hours  Recent Labs  07/02/13 0539  NA 133*  K 4.5  CL 108  GLUCOSE 80  BUN 36*  CREATININE 1.28*  CALCIUM 7.1*   CBG (last 3)  No results found for this basename: GLUCAP,  in the last 72 hours  Wt Readings from Last 3 Encounters:  06/26/13 64.3 kg (141 lb 12.1 oz)  06/17/13 66.9 kg (147 lb 7.8 oz)  05/31/13 70.761 kg (156 lb)    Physical Exam:  Constitutional: She is oriented to person, place. Room smells of stool Frail 80 year old , reaching at dentures which arent there HENT:  Head: Normocephalic.  Eyes: EOM are normal.  Neck: Normal range of motion. Neck supple. No thyromegaly present.  Cardiovascular: Normal rate and regular rhythm. No murmurs. No gallops  Respiratory: Effort normal and breath sounds normal. No respiratory distress. No wheezes, rales  GI: Soft. Bowel sounds are normal. She exhibits no distension. Rectal tube still with soft, liquid stool Neurological: She is alert and oriented to place. Slow to arouse this am.follows all simple commands  Follows simple commands. UE 4-prox with deltoid, bicep to 4 distally. LE's 1 to 1+ with LEFt HF (pain component), RIGHT HF 3. KE 2- left, 2+ right, ankles 3/5. Decreased LT at both feet ,no ptosis, no dsyarthria, CN exam intact  Skin: stable gluteal/perineal/waist line with a few patches of healing round lesions. Appear to be resolving herpetic lesions.  Marland Kitchen Psychiatric: affect very flat. A little confused this am..       Assessment/Plan: 1. Functional deficits secondary to Guillain Barre Syndrome which require 3+ hours per day of interdisciplinary therapy in a comprehensive inpatient rehab setting. Physiatrist is providing close team supervision and 24 hour management of active medical problems listed below. Physiatrist and rehab team continue to assess barriers to discharge/monitor patient progress toward functional and medical goals.  Therapy reduced to QD due to poor tolerance Appreciate Palliative care assistance. Dr. Jenne Campus to meet with patient and family today to establish goals of care  FIM: FIM - Bathing Bathing Steps Patient Completed: Chest;Right Arm;Left Arm;Abdomen;Right upper leg;Left upper leg Bathing: 3: Mod-Patient completes 5-7 57f 10 parts or 50-74%  FIM - Upper Body Dressing/Undressing Upper body dressing/undressing steps patient completed: Thread/unthread right sleeve of pullover shirt/dresss;Thread/unthread left sleeve of pullover shirt/dress;Put head through opening of pull over shirt/dress Upper body dressing/undressing: 4: Min-Patient completed 75 plus % of tasks FIM - Lower Body Dressing/Undressing Lower body dressing/undressing steps patient completed: Don/Doff right sock Lower body dressing/undressing: 1: Total-Patient completed less than 25% of tasks  FIM - Toileting Toileting steps completed by patient: Performs perineal hygiene Toileting Assistive Devices: Grab bar or rail for support Toileting: 3: Mod-Patient completed 2 of 3 steps  FIM - Radio producer Devices: Elevated toilet seat Toilet Transfers: 3-To toilet/BSC: Mod A (lift or lower assist);3-From toilet/BSC: Mod A (lift or lower assist)  FIM - Bed/Chair Transfer Bed/Chair Transfer Assistive Devices: Bed rails Bed/Chair Transfer: 3: Supine > Sit:  Mod A (lifting assist/Pt. 50-74%/lift 2 legs;2: Chair or W/C > Bed: Max A (lift and lower assist)  FIM - Locomotion:  Wheelchair Locomotion: Wheelchair: 1: Travels less than 50 ft with minimal assistance (Pt.>75%) FIM - Locomotion: Ambulation Locomotion: Ambulation Assistive Devices: Administrator Ambulation/Gait Assistance: 1: +2 Total assist Locomotion: Ambulation: 1: Two helpers  Comprehension Comprehension Mode: Auditory Comprehension: 5-Understands complex 90% of the time/Cues < 10% of the time  Expression Expression Mode: Verbal Expression: 5-Expresses complex 90% of the time/cues < 10% of the time  Social Interaction Social Interaction: 4-Interacts appropriately 75 - 89% of the time - Needs redirection for appropriate language or to initiate interaction.  Problem Solving Problem Solving: 3-Solves basic 50 - 74% of the time/requires cueing 25 - 49% of the time  Memory Memory: 5-Recognizes or recalls 90% of the time/requires cueing < 10% of the time Medical Problem List and Plan:  1. Suspect GBS. (? Myasthenia gravis). IVIG completed. Continue CellCept  2. DVT Prophylaxis/Anticoagulation: History of DVT with IVC filter  3. Pain Management: Hydrocodone as needed. Monitor with increased mobility  -added lidoderm patch for left low back/hip pain---suspect pain is from lumbar spondylosis/referral  3. Mood/anxiety/depression: Wellbutrin 150 mg daily, Xanax 0.25 mg twice daily as needed and 1 mg each bedtime as needed   --seems to be having increased depressions with flat affect, loss of energy, poor appetite, dreams of dying--would like to get neuropsych's input  4. Neuropsych: This patient is capable of making decisions on her own behalf.   5. Hypothyroidism. Synthroid  6. Hyperlipidemia. Lipitor  7. Chronic anemia/thrombocytopenia. Latest hemoglobin 8.6 and platelet count 84,000 improved from 67,000.   iron supplement held.  followup CBC  8. Sacral wounds. Appreciate WOC input and recs -  likely herpetic lesions/ HSV   -air mattress  9. Chronic loose stool: more severe during this  hospitalization   -probiotic  -imodium 4mg  qid  -rectal tube   -stopped iron and multivit due to nausea  -c diff negative  -continue fiber and colestid  -GI consult appreciated. ?flex-sig---to r/o CMV colitis--pt is willing to pursue but not sure how appropriate it is at this point.  -cellcept contribution?      10. FEN  -continue IVF at 50cc /hr, po intake still poor            -added megace for appetite, encouraged pt to push po intake  -labs ok    LOS (Days) 14 A FACE TO FACE EVALUATION WAS PERFORMED  [name] T 07/03/2013 7:48 AM

## 2013-07-03 NOTE — Progress Notes (Signed)
Occupational Therapy Session Note  Patient Details  Name: [name] MRN: [id number] Date of Birth: June 26, 1924  Today's Date: 07/03/2013 Time: 1030-1130 Time Calculation (min): 60 min  Short Term Goals: Week 2:  OT Short Term Goal 1 (Week 2): Patient will complete lower body bathing and dressing with max assist OT Short Term Goal 2 (Week 2): Patient will complete toilet transfer with mod assist OT Short Term Goal 3 (Week 2): Patient will complete upper body bathing and dressing with supervision and setup OT Short Term Goal 4 (Week 2): Patient will demo ability to perform sit <> stand during ADL to pull-up underwear and/or pants with min assist  Skilled Therapeutic Interventions/Progress Updates: ADL-retraining with focus on improved activity tolerance, bed mobility, and functional transfers.   Patient received supine in bed with diminished alertness and disorientation to place, day and time.   Patient was encouraged to participate in bed mobility to allow RN access to change dressing during this session.  Patient required hand guidance to locate bed rails but sustain attention to task and completed trunk rotation to roll to both left and right with min manual facilitation to initiate.    Following wound care with total assist to change diaper, patient was presented with washcloth to wash peri-area but was unable to reach or sustain grasp and required total assist while supine.   Lower body bathing was provided with total assist and included PROM of bil LE to improve alertness.   HR/BP assessed using dynamap as follows:   HR 97/67 (supine), BP 81 bpm.   Patient was assisted to sitting at edge of bed (pt-= 40%) and to w/c using squat-pivot transfer (pt=20%).    Encounter completed with no remaining time for upper body bathing and dressing due to fatigue.   Patient left at bedside reclined in w/c, call light placed in her hand, with phone and refreshments at over bed table beside w/c.     Therapy  Documentation Precautions:  Precautions Precautions: Fall Precaution Comments: increased dizziness with sitting Restrictions Weight Bearing Restrictions: No  Pain: Pain Assessment Pain Assessment: No/denies pain  ADL: ADL ADL Comments: see FIM  See FIM for current functional status  Therapy/Group: Individual Therapy  Olivia Lopez de Gutierrez 07/03/2013, 12:41 PM

## 2013-07-03 NOTE — Progress Notes (Signed)
Physical Therapy Note  Patient Details  Name: [name] MRN: [id number] Date of Birth: 05/21/1925 Today's Date: 07/03/2013  1345-1420 (35 minutes) individual (missed 25 minutes /fatigue) Pain :no complaint of pain Focus of treatment: therapeutic exercise focused on bilateral LE strengthening/ activity tolerance Other: BP supine - 109/70 , sitting 81/50, supine- 116/78 with reported increased dizziness in sitting  Treatment: pt in bed upon arrival; therapeutic exercise X 15 - AA ankle pumps, heel slides, hip abduction; raised head of bed ball toss 2 x 20; supine to sit max assist ; sitting edge of bed (with TED hose) X 2 minutes; sit to supine max assist (see BPs above).   Lando Alcalde,JIM 07/03/2013, 2:21 PM

## 2013-07-04 ENCOUNTER — Inpatient Hospital Stay (HOSPITAL_COMMUNITY): Payer: Medicare Other | Admitting: Physical Therapy

## 2013-07-04 ENCOUNTER — Inpatient Hospital Stay (HOSPITAL_COMMUNITY): Payer: Medicare Other

## 2013-07-04 DIAGNOSIS — Z515 Encounter for palliative care: Secondary | ICD-10-CM

## 2013-07-04 LAB — COMPREHENSIVE METABOLIC PANEL
ALBUMIN: 1.3 g/dL — AB (ref 3.5–5.2)
ALT: 10 U/L (ref 0–35)
AST: 16 U/L (ref 0–37)
Alkaline Phosphatase: 108 U/L (ref 39–117)
BILIRUBIN TOTAL: 0.2 mg/dL — AB (ref 0.3–1.2)
BUN: 38 mg/dL — AB (ref 6–23)
CHLORIDE: 106 meq/L (ref 96–112)
CO2: 11 meq/L — AB (ref 19–32)
Calcium: 7.4 mg/dL — ABNORMAL LOW (ref 8.4–10.5)
Creatinine, Ser: 1.33 mg/dL — ABNORMAL HIGH (ref 0.50–1.10)
GFR, EST AFRICAN AMERICAN: 40 mL/min — AB (ref >90)
GFR, EST NON AFRICAN AMERICAN: 35 mL/min — AB (ref >90)
Glucose, Bld: 65 mg/dL — ABNORMAL LOW (ref 70–99)
Potassium: 4.7 mEq/L (ref 3.7–5.3)
Sodium: 131 mEq/L — ABNORMAL LOW (ref 137–147)
Total Protein: 3.6 g/dL — ABNORMAL LOW (ref 6.0–8.3)

## 2013-07-04 LAB — URINALYSIS, ROUTINE W REFLEX MICROSCOPIC
Bilirubin Urine: NEGATIVE
Glucose, UA: NEGATIVE mg/dL
Ketones, ur: NEGATIVE mg/dL
Nitrite: NEGATIVE
Protein, ur: 30 mg/dL — AB
Specific Gravity, Urine: 1.018 (ref 1.005–1.030)
Urobilinogen, UA: 0.2 mg/dL (ref 0.0–1.0)
pH: 5.5 (ref 5.0–8.0)

## 2013-07-04 LAB — CBC
HCT: 27.8 % — ABNORMAL LOW (ref 36.0–46.0)
Hemoglobin: 9.7 g/dL — ABNORMAL LOW (ref 12.0–15.0)
MCH: 30.4 pg (ref 26.0–34.0)
MCHC: 34.9 g/dL (ref 30.0–36.0)
MCV: 87.1 fL (ref 78.0–100.0)
Platelets: 101 10*3/uL — ABNORMAL LOW (ref 150–400)
RBC: 3.19 MIL/uL — ABNORMAL LOW (ref 3.87–5.11)
RDW: 16.1 % — AB (ref 11.5–15.5)
WBC: 5.3 10*3/uL (ref 4.0–10.5)

## 2013-07-04 LAB — URINE MICROSCOPIC-ADD ON

## 2013-07-04 LAB — TSH: TSH: 9.471 u[IU]/mL — ABNORMAL HIGH (ref 0.350–4.500)

## 2013-07-04 MED ORDER — DICLOFENAC SODIUM 1 % TD GEL
2.0000 g | Freq: Four times a day (QID) | TRANSDERMAL | Status: DC | PRN
  Filled 2013-07-04: qty 100

## 2013-07-04 MED ORDER — DEXTROSE 5 % IV SOLN
1.0000 g | INTRAVENOUS | Status: DC
  Administered 2013-07-04 – 2013-07-05 (×2): 1 g via INTRAVENOUS
  Filled 2013-07-04 (×3): qty 10

## 2013-07-04 MED ORDER — DIPHENOXYLATE-ATROPINE 2.5-0.025 MG/5ML PO LIQD
10.0000 mL | Freq: Four times a day (QID) | ORAL | Status: DC
  Administered 2013-07-04 – 2013-07-06 (×5): 10 mL via ORAL
  Filled 2013-07-04 (×6): qty 10

## 2013-07-04 MED ORDER — OXYCODONE HCL 20 MG/ML PO CONC
5.0000 mg | ORAL | Status: DC | PRN
  Administered 2013-07-05: 5 mg via ORAL
  Filled 2013-07-04: qty 1

## 2013-07-04 MED ORDER — DIPHENOXYLATE-ATROPINE 2.5-0.025 MG/5ML PO LIQD: 5.0000 mL | Freq: Four times a day (QID) | ORAL | Status: DC

## 2013-07-04 NOTE — Progress Notes (Signed)
Physical Therapy Weekly Progress Note  Patient Details  Name: [name] MRN: [id number] Date of Birth: 1925/05/03  Today's Date: 07/04/2013 Time: 1300-1340 Time Calculation (min): 40 min  Patient has met 0 of 3 short term goals.  Pt has been regressing functionally and has not been tolerating therapies well. Blood pressures have been orthostatic despite abdominal binder and TED hose to compensate. Pt verbalizes generalized malaise and although is very willing to participate as much as able - has little energy or strength to contribute. Pt's difficulties with therapies have been communicated throughout the team and meetings are underway with the family. Pt does not appear to make expected gains needed to go home and a secondary plan needs to be formulated.   Patient continues to demonstrate the following deficits: decreased endurance, orthostatic hypotension, impaired sitting balance, and has been unable to tolerate standing and therefore will continue to benefit from skilled PT intervention to enhance overall performance with activity tolerance.  Patient not progressing toward long term goals.  See goal revision..  Plan of care revisions: see MD notes on update, goals updated.  PT Short Term Goals Week 2:  PT Short Term Goal 1 (Week 2): Pt will transfer bed <> wheelchair with mod assist.  PT Short Term Goal 1 - Progress (Week 2): Not met PT Short Term Goal 2 (Week 2): Pt will sit <> stand with mod assist PT Short Term Goal 2 - Progress (Week 2): Not met PT Short Term Goal 3 (Week 2): Pt will tolerate 1 min continuous static standing with mod assist and RW.  PT Short Term Goal 3 - Progress (Week 2): Not met  Skilled Therapeutic Interventions/Progress Updates:    Pt reclined in tilt-in-space chair, more confused and less coherent speech. Difficulty keeping eyes open. BP 92/66. Pt tolerated bil ankle pumps with light manual resistance and bil hip/knee moderately resisted 3 x 10 reps each  with rest breaks between. Pt requested to return to bed, utilized maximove lift with two persons back to bed. Rolling with moderate assist to remove lift sling.    Therapy Documentation Precautions:  Precautions Precautions: Fall Precaution Comments: increased dizziness with sitting Restrictions Weight Bearing Restrictions: No Pain:  no c/o  See FIM for current functional status  Therapy/Group: Individual Therapy  Lahoma Rocker 07/04/2013, 4:02 PM

## 2013-07-04 NOTE — Progress Notes (Addendum)
Subjective/Complaints: Slow to arouse. Confused and hallucinating  A 12 point review of systems has been performed and if not noted above is otherwise negative.   Objective: Vital Signs: Blood pressure 89/56, pulse 91, temperature 99 F (37.2 C), temperature source Oral, resp. rate 17, height 5\' 4"  (1.626 m), weight 64.3 kg (141 lb 12.1 oz), SpO2 90.00%. No results found. No results found for this basename: WBC, HGB, HCT, PLT,  in the last 72 hours  Recent Labs  07/02/13 0539  NA 133*  K 4.5  CL 108  GLUCOSE 80  BUN 36*  CREATININE 1.28*  CALCIUM 7.1*   CBG (last 3)  No results found for this basename: GLUCAP,  in the last 72 hours  Wt Readings from Last 3 Encounters:  06/26/13 64.3 kg (141 lb 12.1 oz)  06/17/13 66.9 kg (147 lb 7.8 oz)  05/31/13 70.761 kg (156 lb)    Physical Exam:  Constitutional: She is oriented to person, place. Room smells of stool Frail 80 year old , talking to self--incoherent HENT:  Head: Normocephalic.  Eyes: EOM are normal.  Neck: Normal range of motion. Neck supple. No thyromegaly present.  Cardiovascular: Normal rate and regular rhythm. No murmurs. No gallops  Respiratory: Effort normal and breath sounds normal. No respiratory distress. No wheezes, rales  GI: Soft. Bowel sounds are normal. She exhibits no distension. Rectal tube still with soft, liquid stool Neurological:  . Slow to arouse this am.   Follows simple commands inconsistently . UE 4-prox with deltoid, bicep to 4 distally. LE's 1 to 1+ with LEFt HF (pain component), RIGHT HF 3. KE 2- left, 2+ right, ankles 3/5. Decreased LT at both feet ,no ptosis, no dsyarthria, CN exam intact  Skin: stable gluteal/perineal/waist line with a few patches of healing round lesions. Appear to be resolving herpetic lesions.  Marland Kitchen Psychiatric: affect very flat.  confused this am..      Assessment/Plan: 1. Functional deficits secondary to Guillain Barre Syndrome which require 3+ hours per day of  interdisciplinary therapy in a comprehensive inpatient rehab setting. Physiatrist is providing close team supervision and 24 hour management of active medical problems listed below. Physiatrist and rehab team continue to assess barriers to discharge/monitor patient progress toward functional and medical goals.  Therapy reduced to QD due to poor tolerance  Appreciate Palliative care assistance. Dr. Jenne Campus to meet with patient and family today to establish goals of care   Additionally, I spoke with daughter [name] this morning about scenario, care needs. We agreed that comfort is first and foremost. I will pursue a "modest" work up of her confusion, but [name] doesn't want anything very interventional. Will place a foley for comfort as well---(pt tries to hold in urine at times so as not to have to get on bed pan or inconvenience staff.) May need to pursue hospice level care. In a lot of ways it appears that [name] has given up---daughter agrees.    FIM: FIM - Bathing Bathing Steps Patient Completed: Chest;Right Arm;Left Arm;Abdomen;Right upper leg;Left upper leg Bathing: 3: Mod-Patient completes 5-7 15f 10 parts or 50-74%  FIM - Upper Body Dressing/Undressing Upper body dressing/undressing steps patient completed: Thread/unthread right sleeve of pullover shirt/dresss;Thread/unthread left sleeve of pullover shirt/dress;Put head through opening of pull over shirt/dress Upper body dressing/undressing: 4: Min-Patient completed 75 plus % of tasks FIM - Lower Body Dressing/Undressing Lower body dressing/undressing steps patient completed: Don/Doff right sock Lower body dressing/undressing: 1: Total-Patient completed less than 25% of tasks  FIM -  Toileting Toileting steps completed by patient: Performs perineal hygiene Toileting Assistive Devices: Grab bar or rail for support Toileting: 3: Mod-Patient completed 2 of 3 steps  FIM - Radio producer Devices: Elevated  toilet seat Toilet Transfers: 0-Activity did not occur  FIM - Control and instrumentation engineer Devices: Bed rails Bed/Chair Transfer: 3: Chair or W/C > Bed: Mod A (lift or lower assist);3: Bed > Chair or W/C: Mod A (lift or lower assist)  FIM - Locomotion: Wheelchair Locomotion: Wheelchair: 1: Travels less than 50 ft with minimal assistance (Pt.>75%) FIM - Locomotion: Ambulation Locomotion: Ambulation Assistive Devices: Administrator Ambulation/Gait Assistance: 1: +2 Total assist Locomotion: Ambulation: 1: Two helpers  Comprehension Comprehension Mode: Auditory Comprehension: 5-Understands complex 90% of the time/Cues < 10% of the time  Expression Expression Mode: Verbal Expression: 5-Expresses complex 90% of the time/cues < 10% of the time  Social Interaction Social Interaction: 4-Interacts appropriately 75 - 89% of the time - Needs redirection for appropriate language or to initiate interaction.  Problem Solving Problem Solving: 3-Solves basic 50 - 74% of the time/requires cueing 25 - 49% of the time  Memory Memory: 3-Recognizes or recalls 50 - 74% of the time/requires cueing 25 - 49% of the time Medical Problem List and Plan:  1. Suspect GBS. (? Myasthenia gravis). IVIG completed. Continue CellCept  2. DVT Prophylaxis/Anticoagulation: History of DVT with IVC filter  3. Pain Management: Hydrocodone as needed. Monitor with increased mobility  -added lidoderm patch for left low back/hip pain---suspect pain is from lumbar spondylosis/referral  3. Mood/anxiety/depression: Wellbutrin 150 mg daily, Xanax 0.25 mg twice daily as needed and 1 mg each bedtime as needed   --increased confusion noted, megace stopped  -urine culture/ua ordered --(urine malodorous and "brown" per RN last night)---NEED THIS COLLECTED  -recheck labwork today as well  4. Neuropsych: This patient is capable of making decisions on her own behalf.  5. Hypothyroidism. Synthroid  6.  Hyperlipidemia. Lipitor  7. Chronic anemia/thrombocytopenia.    iron supplement held.  followup CBC  8. Sacral wounds. Appreciate WOC input and recs -  likely herpetic lesions/ HSV   -air mattress  9. Chronic loose stool: more severe during this hospitalization   -probiotic  -imodium 4mg  qid  -rectal tube   -stopped iron and multivit due to nausea  -c diff negative  -continue fiber and colestid  -GI consult appreciated. ?flex-sig---to r/o CMV colitis--pt is willing to pursue but not sure how appropriate it is at this point.  -cellcept contribution?      10. FEN  -continue IVF at 50cc /hr, po intake still poor            -stopped megace, could be contributing to increased confusion  -labs ok  11. Uro: insert foley for comfort today.  LOS (Days) 15 A FACE TO FACE EVALUATION WAS PERFORMED  SWARTZ,ZACHARY T 07/04/2013 8:12 AM

## 2013-07-04 NOTE — Progress Notes (Signed)
[facility] Progress Note  Subjective:  Is very tired and weak.  Speaks very quietly and does not say much today.  Tearful.  Objective:  Vital signs in last 24 hours: Temp:  [97.7 F (36.5 C)-99 F (37.2 C)] 99 F (37.2 C) (02/12 0453) Pulse Rate:  [80-91] 91 (02/12 0453) Resp:  [14-17] 17 (02/12 0453) BP: (89-100)/(48-56) 89/56 mmHg (02/12 0453) SpO2:  [90 %-97 %] 90 % (02/12 0453) Last BM Date: 07/03/13 General:  Alert, chronically ill-appearing, in NAD;  Appears exhausted Heart:  Regular rate and rhythm; no murmurs Pulm:  CTAB.  No W/R/R. Abdomen:  Soft, non-distended. Normal bowel sounds.  Non-tender.   Extremities:  Trace edema noted in B/L LE's Psych:  Alert and cooperative. Depressed mood and flat affect.  Intake/Output from previous day: 02/11 0701 - 02/12 0700 In: 50 [P.O.:50] Out: 216 [Urine:216] Intake/Output this shift: Total I/O In: 120 [P.O.:120] Out: 800 [Stool:800]  Lab Results:  Recent Labs  07/04/13 0900  WBC 5.3  HGB 9.7*  HCT 27.8*  PLT 101*   BMET  Recent Labs  07/02/13 0539  NA 133*  K 4.5  CL 108  CO2 12*  GLUCOSE 80  BUN 36*  CREATININE 1.28*  CALCIUM 7.1*   LFT  Recent Labs  07/02/13 0539  PROT 3.5*  ALBUMIN 1.1*  AST 13  ALT 10  ALKPHOS 85  BILITOT 0.3   Assessment / Plan: -Diarrhea. History of CMV colitis s/p treatment with Ganciclovir. Having watery stools (800cc in the past 24 hours); has rectal tube.  Colestid changed back to questran BID yesterday because patient could not swallow the colestid pill. Continue lomotil prn, probiotic, and fiber supplement. Lactose free diet. Could consider trial of pancreatic enzymes.  ? Need for flex sig.  -Suspected GBS. IVIG completed.   -Myasthenia Gravis: On CellCept.   -History of DVT with IVC filter   -Hypothyroidism:  TSH pending for today.   -Chronic anemia/thrombocytopenia: Stable.   -Sacral wounds: ? herpetic lesions/HSV.   **? Goals of care meeting is  arranged with family for this afternoon.  Will await those decisions.    LOS: 15 days   ZEHR, JESSICA D.  07/04/2013, 9:43 AM  Pager number [phone] [place] Attending  I have also seen and assessed the patient and agree with the above note.   I spoke to family - they are not interested in pursuing further work-up of diarrhea. Patient looks exhausted and worn out - not able to maintain conversation. I am starting Lomotil elixir and you can increase that if needed. Change to Dysphagia 1 diet -  Will stop cholestyramine as may just bind meds and has not been able to adequately take it.  It does not look like we have much else to offer her.  Please call back if needed.  Gatha Mayer, MD, [facility] [phone] (pager) 07/04/2013 4:29 PM

## 2013-07-04 NOTE — Plan of Care (Signed)
Problem: RH BOWEL ELIMINATION Goal: RH STG MANAGE BOWEL WITH ASSISTANCE STG Manage Bowel with total Assistance.  Outcome: Progressing Goal changed to total assistance. Chronic diarrhea

## 2013-07-04 NOTE — Progress Notes (Signed)
Occupational Therapy Session Note  Patient Details  Name: [name] MRN: [id number] Date of Birth: 07-14-1924  Today's Date: 07/04/2013 Time: 5456-2563 Time Calculation (min): 65 min  Short Term Goals: Week 2:  OT Short Term Goal 1 (Week 2): Patient will complete lower body bathing and dressing with max assist OT Short Term Goal 2 (Week 2): Patient will complete toilet transfer with mod assist OT Short Term Goal 3 (Week 2): Patient will complete upper body bathing and dressing with supervision and setup OT Short Term Goal 4 (Week 2): Patient will demo ability to perform sit <> stand during ADL to pull-up underwear and/or pants with min assist  Skilled Therapeutic Interventions/Progress Updates: ADL-retraining with focus on improved activity tolerance, bed mobility, endurance, and improved cognition (focused attention, memory, orientation). Patient received supine in bed, receiving RN care.  RN was appraised on patient's declining transfer status with plan to introduce mechanical lift as means to establish out-of-bed schedule.  OT provided total assist for lower body bathing and dressing which typically improves patient's ability to respond and sustain attention to upper body bathing and dressing.   Patient exhibited no improvement this date and was minimally responsive with poor vocalizations, incoherent speech, her gaze fixed toward ceiling and eyelids fluttering.   HR/BP assessed, supine with HOB elevated as follows: 91/65,81 bpm.   Patient required max assist for bed mobility with hand guidance to reach for and release bed rails with tremors noted bil hands.  Patient requested a sip of coffee but she could not hold cup safely or bring it to her mouth even with hand guidance: self-feeding skills are declining.  Patient tolerated sling applied carefully during bed mobility and use of Maxi-move lift to facilitate placement and positioning in her w/c which was reclined to provide comfort to  lower back/buttocks.  Patient left in w/c, call light placed on her lap, bedside table nearby.   PA alerted to decline in self-feeding skills with recommendation for assisted feeding.    Therapy Documentation Precautions:  Precautions Precautions: Fall Precaution Comments: increased dizziness with sitting Restrictions Weight Bearing Restrictions: No  Pain: Pain Assessment Pain Assessment: Faces Faces Pain Scale: Hurts little more Pain Location: Buttocks Pain Intervention(s): Medication (See eMAR)  ADL: ADL ADL Comments: see FIM  See FIM for current functional status  Therapy/Group: Individual Therapy  Laveda Demedeiros 07/04/2013, 10:49 AM

## 2013-07-04 NOTE — Progress Notes (Signed)
Occupational Therapy Weekly Progress Note  Patient Details  Name: [name] MRN: [id number] Date of Birth: April 04, 1925  Today's Date: 07/04/2013  Patient has partly met 3 of 4 short term goals.  Patient's achieved 3 of 4 goals initially but suffered decline in physical status and continues to deteriorate relating to performance of self-care.   Goals revised to reflect need for continued significant assist with BADL.  Patient continues to demonstrate the following deficits: Decreased alertness, diminished activity tolerance, impaired strength, impaired balance, impaired transfers skills and therefore will continue to benefit from skilled OT intervention to enhance overall performance with BADL.  Patient not progressing toward long term goals.  See goal revision..  Plan of care revisions: Anticipate patient will require Mod - Max assist with all ADL..  OT Short Term Goals Week 2:  OT Short Term Goal 1 (Week 2): Patient will complete lower body bathing and dressing with max assist OT Short Term Goal 1 - Progress (Week 2): Partly met OT Short Term Goal 2 (Week 2): Patient will complete toilet transfer with mod assist OT Short Term Goal 2 - Progress (Week 2): Not progressing OT Short Term Goal 3 (Week 2): Patient will complete upper body bathing and dressing with supervision and setup OT Short Term Goal 3 - Progress (Week 2): Partly met OT Short Term Goal 4 (Week 2): Patient will demo ability to perform sit <> stand during ADL to pull-up underwear and/or pants with min assist OT Short Term Goal 4 - Progress (Week 2): Partly met Week 3:  OT Short Term Goal 1 (Week 3): STG = LTG due to anticipated discharge requiring 24/7 assisted care  Therapy Documentation Precautions:  Precautions Precautions: Fall Precaution Comments: increased dizziness with sitting Restrictions Weight Bearing Restrictions: No  Pain: 4/10, face scale, lower back and buttocks   ADL: ADL ADL Comments: see  FIM   Johnanthony Wilden 07/04/2013, 2:58 PM

## 2013-07-04 NOTE — Consult Note (Addendum)
Palliative Medicine Team Consult Note  HPI/Reason for Consultation: 80 yo woman with multiple serious and end stage medical problems including Myathenia Gravis, Guillain Bare, Chronic DVT, immune suppression from cellcept with prior diagnosis of CMV colitis and recurrent UTI. She has numerous other chronic medical problems that include PAH, CHF, prior NSTEMI, A-Fib and hypothyroidism. She is currently in inpatient rehab with teh intention of retuning home where she was previously cared for by her daughter and her husband in [place], [place]. Family reports that she has had a rapid decline over the past month but that she really has not been well since June. The patient has stated she is tired of suffering and being sick.She is not progressing with rehab and is declining-today she is minimally responsive on my visit- her daughter is trying to feed her and she looks agitated and is requiring maximal cueing just to open her mouth.  Participants in Meeting: Met with patient and her two daughters; Dwain Sarna, and her SIL.  Summary of Goals and Recommendations: 1. DNR 2. I offered this family the option of comfort care and Hospice given stated goals and her failure to make any meaningful improvements-family does not want her to be in a SNF. They are agreeable to [facility]: Will need hospice facility placement-they prefer [facility] followed by [facility]. 3. Prognosis is very poor-I estimate <3 weeks-possibly much less based on her clinical appearance this evening. 4. She does have a UTI which may be making things worse for her- will treat this X1 with Rocephin for now, but family understands that infections may develop as EOL nears and that IV antibiotics would probably not be a part of a Hospice Facility treatment plan. Stop abx at discharge or transition to PO. 5. We discussed comfort feeding. 6. IV fluids until discharge and then stop unless she develops congestion or overload. 7.  Med admin is difficult with her mental status and swallowing issues-will change her opiate to Oxyfast SL. 8. Discontinued some of the unnecessary meds. 9. Diarrhea chronic from CMV colitis lomotil increased to 10 QID.  70 minutes. Greater than 50%  of this time was spent counseling and coordinating care related to the above assessment and plan.   Lane Hacker, DO Palliative Medicine

## 2013-07-05 ENCOUNTER — Inpatient Hospital Stay (HOSPITAL_COMMUNITY): Payer: Medicare Other | Admitting: Physical Therapy

## 2013-07-05 ENCOUNTER — Inpatient Hospital Stay (HOSPITAL_COMMUNITY): Payer: Medicare Other

## 2013-07-05 DIAGNOSIS — Z66 Do not resuscitate: Secondary | ICD-10-CM | POA: Diagnosis not present

## 2013-07-05 DIAGNOSIS — Z515 Encounter for palliative care: Secondary | ICD-10-CM

## 2013-07-05 MED ORDER — LEVOTHYROXINE SODIUM 125 MCG PO TABS
125.0000 ug | ORAL_TABLET | Freq: Every day | ORAL | Status: DC
  Administered 2013-07-06: 125 ug via ORAL
  Filled 2013-07-05 (×3): qty 1

## 2013-07-05 MED ORDER — VITAL 1.0 CAL PO LIQD
237.0000 mL | ORAL | Status: DC | PRN
  Filled 2013-07-05: qty 237

## 2013-07-05 NOTE — Progress Notes (Signed)
NUTRITION FOLLOW UP  Intervention:   1.  Supplements; Vital 1.0 PO prn as oral feeding supplement.  2.  Disontinue MVI daily. 3.  General healthful diet; encourage intake of foods and beverages as pt is willing to accept.  RD to follow and assess for nutritional adequacy. Pt likely transitioning to comfort care.   Nutrition Dx: Inadequate oral intake related to poor appetite AEB PO 50% of meal, wt loss  Monitor:   1.  Food/Beverage; pt meeting >/=90% estimated needs with tolerance.  Not met.  Interventions in place. 2.  Wt/wt change; monitor trends  Ongoing.   Assessment:   Patient is an 80 y.o. Female with PMH of myasthenia gravis on cellcept, uterine cancer, DVT s/p IVC filter because of GIB, hypothyroidism, NICM, PAF, anxiety, anemia, and vitamin B12 deficiency. Patient was admitted to [facility] ED on 1/20 complaining of progressive muscle weakness over the previous 3 days. Patient was transferred to [facility] later on 1/20. Work-up revealed myasthenia gravis causing lower extremity fatigability versus possible GBS. Pt admitted to inpatient rehab on 1/28.  Pt with several wounds to sacrum and frequent watery diarrhea.  Now with flexiseal.  PO intake 0-30% of meals.  Pt not taking supplements.  Pt lethargic and confused today.  Per MD note, is declining. Pt did eat 5 spoons of oatmeal and 4 sips of OJ this AM with OT.   Per discussion with Palliative team and family last night, pt will likely transition to hospice facility with focus on comfort.  RD has changed supplement orders to PRN. RD will follow for appropriate interventions.    Height: Ht Readings from Last 1 Encounters:  06/19/13 $RemoveB'5\' 4"'kRvmQEpn$  (1.626 m)    Weight Status:   Wt Readings from Last 1 Encounters:  07/05/13 143 lb 8.3 oz (65.1 kg)    Re-estimated needs:  Kcal: 1700-1900 Protein: 85-95g Fluid: ~2.0 L/day  Skin: Stage 2 pressure ulcer, generalized edema  Diet Order: Dysphagia 2, thin   Intake/Output Summary  (Last 24 hours) at 07/05/13 1241 Last data filed at 07/05/13 0951  Gross per 24 hour  Intake      0 ml  Output   1200 ml  Net  -1200 ml    Last BM: 2/4, diarrhea via flexiseal   Labs:   Recent Labs Lab 06/29/13 0445 07/02/13 0539 07/04/13 0900  NA 132* 133* 131*  K 4.1 4.5 4.7  CL 107 108 106  CO2 14* 12* 11*  BUN 33* 36* 38*  CREATININE 1.16* 1.28* 1.33*  CALCIUM 6.8* 7.1* 7.4*  GLUCOSE 107* 80 65*    CBG (last 3)  No results found for this basename: GLUCAP,  in the last 72 hours  Scheduled Meds: . antiseptic oral rinse  15 mL Mouth Rinse BID  . aspirin  81 mg Oral Daily  . buPROPion  150 mg Oral Q breakfast  . cefTRIAXone (ROCEPHIN)  IV  1 g Intravenous Q24H  . diphenoxylate-atropine  10 mL Oral QID  . feeding supplement (RESOURCE BREEZE)  1 Container Oral BID BM  . feeding supplement (VITAL 1.0 CAL)  237 mL Oral Q24H  . lactobacillus  1 g Oral TID WC  . [START ON 07/06/2013] levothyroxine  125 mcg Oral QAC breakfast  . lidocaine  1 patch Transdermal Q24H  . pantoprazole  40 mg Oral Daily  . polycarbophil  625 mg Oral BID  . prochlorperazine  10 mg Oral TID    Continuous Infusions: . sodium chloride 50 mL/hr  at 07/04/13 0227    Brynda Greathouse, MS RD LDN Clinical Inpatient Dietitian Pager: [phone] Weekend/After hours pager: [phone]

## 2013-07-05 NOTE — Progress Notes (Signed)
Occupational Therapy Session Note  Patient Details  Name: [name] MRN: [id number] Date of Birth: 06/21/1924  Today's Date: 07/05/2013 Time: 2703-5009 Time Calculation (min): 75 min  Short Term Goals: Week 3:  OT Short Term Goal 1 (Week 3): STG = LTG due to anticipated discharge requiring 24/7 assisted care  Skilled Therapeutic Interventions/Progress Updates: ADL-retraining with focus on improved activity tolerance, bed mobility, self-feeding, and focused attention during assisted ADL.  Patient received supine in bed, attended by RN providing meds.   Patient was alert and responsive although participating with her eyes closed 50% of the time.   Patient receptive for assisted lower body bathing and dressing in prep for transfer to w/c to attempt self-feeding.   Patient maintained active role with moving her legs when cued during lower body bathing, sustaining knee extension as needed to elevate legs above bed to allow thorough bathing.   Pateint completed bed mobility rolling to right and left with max assist to facilitate knee flexion and trunk rotation as well as hand guidance to reach across midline to grasp bed rail.   Patient transferred to reclining w/c with Maxi-move and tolerated process w/o complaining, grasping sling yoke with min assist to improve stabilization during transfer.    While sitting in w/c, patient was presented with spoon for self-feeding but could not bring it to her mouth accurately d/t tremors.   Patient was also unable to hold cup or use straw effectively this session.   With extra time and total assist, patient consumed 5 spoons of oatmeal w/brown sugar, and 4 sips of orange juice with OT using straw as a passive siphon and releasing juice using finger.   Patient requested cessation of assisted self-feeding and was left in w/c, call light placed on her lap.      Therapy Documentation Precautions:  Precautions Precautions: Fall Precaution Comments: increased  dizziness with sitting Restrictions Weight Bearing Restrictions: No  Pain: Pain Assessment Pain Assessment: Faces Faces Pain Scale: Hurts little more Pain Location: Back Pain Intervention(s): Medication (See eMAR)  ADL: ADL ADL Comments: see FIM  See FIM for current functional status  Therapy/Group: Individual Therapy  Teryn Boerema 07/05/2013, 11:39 AM

## 2013-07-05 NOTE — Progress Notes (Signed)
Subjective/Complaints: Confused/lethargic  A 12 point review of systems has been performed and if not noted above is otherwise negative.   Objective: Vital Signs: Blood pressure 80/57, pulse 62, temperature 99.4 F (37.4 C), temperature source Oral, resp. rate 16, height 5\' 4"  (1.626 m), weight 65.1 kg (143 lb 8.3 oz), SpO2 90.00%. No results found.  Recent Labs  07/04/13 0900  WBC 5.3  HGB 9.7*  HCT 27.8*  PLT 101*    Recent Labs  07/04/13 0900  NA 131*  K 4.7  CL 106  GLUCOSE 65*  BUN 38*  CREATININE 1.33*  CALCIUM 7.4*   CBG (last 3)  No results found for this basename: GLUCAP,  in the last 72 hours  Wt Readings from Last 3 Encounters:  07/05/13 65.1 kg (143 lb 8.3 oz)  06/17/13 66.9 kg (147 lb 7.8 oz)  05/31/13 70.761 kg (156 lb)    Physical Exam:  Constitutional: She is oriented to person, place. Room smells of stool Frail 80 year old   HENT:  Head: Normocephalic.  Eyes: EOM are normal.  Neck: Normal range of motion. Neck supple. No thyromegaly present.  Cardiovascular: Normal rate and regular rhythm. No murmurs. No gallops  Respiratory: Effort normal and breath sounds normal. No respiratory distress. No wheezes, rales  GI: Soft. Bowel sounds are normal. She exhibits no distension. Rectal tube still with soft, liquid stool Neurological:  . Slow to arouse this am.   Follows simple commands inconsistently . UE 4-prox with deltoid, bicep to 4 distally. LE's 1 to 1+ with LEFt HF (pain component), RIGHT HF 3. KE 2- left, 2+ right, ankles 3/5. Decreased LT at both feet ,no ptosis, no dsyarthria, CN exam intact  Skin: stable gluteal/perineal/waist line with a few patches of healing round lesions. Appear to be resolving herpetic lesions.  Marland Kitchen Psychiatric: affect very flat.  confused this am..      Assessment/Plan: 1. Functional deficits secondary to Guillain Barre Syndrome which require 3+ hours per day of interdisciplinary therapy in a comprehensive  inpatient rehab setting. Physiatrist is providing close team supervision and 24 hour management of active medical problems listed below. Physiatrist and rehab team continue to assess barriers to discharge/monitor patient progress toward functional and medical goals.  Therapy reduced to QD due to poor tolerance  Appreciate Palliative care assistance.  Hospice transfer pending after discussion with Palliative care team last night.     FIM: FIM - Bathing Bathing Steps Patient Completed: Chest;Right Arm;Left Arm;Abdomen;Right upper leg;Left upper leg Bathing: 1: Total-Patient completes 0-2 of 10 parts or less than 25%  FIM - Upper Body Dressing/Undressing Upper body dressing/undressing steps patient completed: Thread/unthread right sleeve of pullover shirt/dresss;Thread/unthread left sleeve of pullover shirt/dress;Put head through opening of pull over shirt/dress Upper body dressing/undressing: 0: Activity did not occur FIM - Lower Body Dressing/Undressing Lower body dressing/undressing steps patient completed: Don/Doff right sock Lower body dressing/undressing: 1: Total-Patient completed less than 25% of tasks  FIM - Toileting Toileting steps completed by patient: Performs perineal hygiene Toileting Assistive Devices: Grab bar or rail for support Toileting: 3: Mod-Patient completed 2 of 3 steps  FIM - Radio producer Devices: Elevated toilet seat Toilet Transfers: 0-Activity did not occur  FIM - Control and instrumentation engineer Devices: Bed rails Bed/Chair Transfer: 1: Mechanical lift  FIM - Locomotion: Wheelchair Locomotion: Wheelchair: 1: Travels less than 50 ft with minimal assistance (Pt.>75%) FIM - Locomotion: Ambulation Locomotion: Ambulation Assistive Devices: Administrator Ambulation/Gait Assistance: 1: +2  Total assist Locomotion: Ambulation: 1: Two helpers  Comprehension Comprehension Mode: Auditory Comprehension:  3-Understands basic 50 - 74% of the time/requires cueing 25 - 50%  of the time  Expression Expression Mode: Verbal Expression: 3-Expresses basic 50 - 74% of the time/requires cueing 25 - 50% of the time. Needs to repeat parts of sentences.  Social Interaction Social Interaction: 2-Interacts appropriately 25 - 49% of time - Needs frequent redirection.  Problem Solving Problem Solving: 1-Solves basic less than 25% of the time - needs direction nearly all the time or does not effectively solve problems and may need a restraint for safety  Memory Memory: 2-Recognizes or recalls 25 - 49% of the time/requires cueing 51 - 75% of the time Medical Problem List and Plan:  1. Suspect GBS. (? Myasthenia gravis). IVIG completed. Continue CellCept  2. DVT Prophylaxis/Anticoagulation: History of DVT with IVC filter  3. Pain Management: Hydrocodone as needed. Monitor with increased mobility  -added lidoderm patch for left low back/hip pain---suspect pain is from lumbar spondylosis/referral  3. Mood/anxiety/depression: Wellbutrin 150 mg daily, Xanax 0.25 mg twice daily as needed and 1 mg each bedtime as needed   --increased confusion noted, megace stopped  -UA +, CTX IV started, continue for now, culture pending  -TSH elevated---resumed levothyroxine. Remaining labs fairly unremarkable  4. Neuropsych: This patient is capable of making decisions on her own behalf.  5. Hypothyroidism. Synthroid  6. Hyperlipidemia. Lipitor  7. Chronic anemia/thrombocytopenia.    iron supplement held.  followup CBC  8. Sacral wounds. Appreciate WOC input and recs -  likely herpetic lesions/ HSV   -air mattress  9. Chronic loose stool: more severe during this hospitalization   -probiotic  -imodium 4mg  qid  -rectal tube   -stopped iron and multivit due to nausea  -c diff negative  -continue fiber and colestid  -GI consult appreciated. ?flex-sig---to r/o CMV colitis--pt is willing to pursue but not sure how appropriate  it is at this point.  -cellcept contribution?      10. FEN  -continue IVF at 50cc /hr,              -stopped megace, could be contributing to increased confusion  -labs ok  11. Uro:  foley for comfort .  LOS (Days) 16 A FACE TO FACE EVALUATION WAS PERFORMED  Ginny Loomer T 07/05/2013 7:51 AM

## 2013-07-05 NOTE — Progress Notes (Signed)
Physical Therapy Session Note  Patient Details  Name: [name] MRN: [id number] Date of Birth: [birth year]  Today's Date: 07/05/2013 Time: 1300-1350 Time Calculation (min): 50 min  Skilled Therapeutic Interventions/Progress Updates:    Pt much less alert upon entry, barely responds to therapist's voice. She demonstrates decreased ability to vocalize needs. Pt reclined in tilt-in-space chair with daughter present BP found to be 71/49 therefore pt returned to bed via maxi-move lift. Pt unable to participate in discussion of continuing therapy. Long discussion with daughter over desires and goals of therapy. Daughter reports she would like education on positioning and making pt comfortable - rest of session focused on educating daughter on how to roll pt and position comfortably with pillows. Daughter encouraged to roll pt approximately every hour (as tolerated by pt) to minimize risk of pain from pressure. Also demonstrated simple LE AROM/PROM exercises to address pt's reported h/o restless leg syndrome. Daughter very receptive but needed emotional support as she reports many deaths recently in family and of friends. Daughter requesting one more session with family education on positioning for comfort. Goals updated to reflect family goals.   Therapy Documentation Precautions:  Precautions Precautions: Fall Precaution Comments: increased dizziness with sitting Restrictions Weight Bearing Restrictions: No General: Amount of Missed PT Time (min): 10 Minutes Missed Time Reason: Patient fatigue Pain: Pain Assessment Pain Score: Asleep  See FIM for current functional status  Therapy/Group: Individual Therapy  Lahoma Rocker 07/05/2013, 3:44 PM

## 2013-07-05 NOTE — Discharge Summary (Signed)
Discharge summary job # [id number]

## 2013-07-05 NOTE — Progress Notes (Signed)
Social Work Patient ID: [name], female   DOB: 05/22/1925, 80 y.o.   MRN: [id number]  Have spoken with Katharine Look of [facility] to review pt's information (faxed earlier in day).  She reports they feel pt very appropriate for Hospice Home (residential) and there will likely be bed availability by Monday.  Possibly an open bed over weekend so I have asked that they contact my cell # when/ if this happens and I will facilitate transfer for pt over.  Have discussed with treatment team and with daughter, [name]. Await contact from Hospice.  Mckinley Adelstein, LCSW

## 2013-07-06 ENCOUNTER — Inpatient Hospital Stay (HOSPITAL_COMMUNITY): Payer: Medicare Other | Admitting: Physical Therapy

## 2013-07-06 DIAGNOSIS — Z515 Encounter for palliative care: Secondary | ICD-10-CM

## 2013-07-06 DIAGNOSIS — N179 Acute kidney failure, unspecified: Secondary | ICD-10-CM

## 2013-07-06 DIAGNOSIS — R209 Unspecified disturbances of skin sensation: Secondary | ICD-10-CM

## 2013-07-06 DIAGNOSIS — R609 Edema, unspecified: Secondary | ICD-10-CM

## 2013-07-06 DIAGNOSIS — G610 Guillain-Barre syndrome: Secondary | ICD-10-CM

## 2013-07-06 MED ORDER — SCOPOLAMINE 1 MG/3DAYS TD PT72
1.0000 | MEDICATED_PATCH | TRANSDERMAL | Status: DC
  Administered 2013-07-06: 1.5 mg via TRANSDERMAL
  Filled 2013-07-06: qty 1

## 2013-07-06 NOTE — Progress Notes (Signed)
Physical Therapy Session Note  Patient Details  Name: [name] MRN: [id number] Date of Birth: 07/28/1924  Today's Date: 07/06/2013 Time: 0900-0915 Time Calculation (min): 15 min  Short Term Goals: Week 3:  PT Short Term Goal 1 (Week 3): unmet long term goals  Skilled Therapeutic Interventions/Progress Updates:    Pt supine in bed on Lt side upon entry, minimally responsive. Family not present. Pt able to participate in 50% of bil LE hip/knee flexion/extension AAROM 2 x 10 reps to promote comfort for pt (as requested by family). +2 assist to slide pt to [facility], max assist to roll to Rt. For pressure relief and positioning for comfort. Pillows adjusted for comfort. BP 66/47 HR 74 RN aware.   Will attempt to rearrange my schedule to come back later today to work with family on pt positioning for comfort. **As of 1145AM family has still not arrived, will continue to check however nursing reports pt to be moved today.  PT missed 45 min skilled PT time due to decreased ability to participate and family not present to work on goals.   Therapy Documentation Precautions:  Precautions Precautions: Fall Precaution Comments: increased dizziness with sitting Restrictions Weight Bearing Restrictions: No Pain: Pain Assessment Pain Score: 0-No pain  See FIM for current functional status  Therapy/Group: Individual Therapy  Lahoma Rocker 07/06/2013, 12:27 PM

## 2013-07-06 NOTE — Discharge Summary (Signed)
NAMESHELENA, [name] NO.:  [id number]  MEDICAL RECORD NO.:  [id number]  LOCATION:  4M08C                        FACILITY:  [place]  PHYSICIAN:  [name], M.D.DATE OF BIRTH:  12-06-1924  DATE OF ADMISSION:  06/19/2013 DATE OF DISCHARGE:                              DISCHARGE SUMMARY   DISCHARGE DIAGNOSES: 1. Suspect Gilliam brace syndrome questionable myasthenia gravis. 2. History of deep venous thrombosis with IVC filter. 3. Pain management. 4. Failure to thrive. 5. Depression. 6. Hypothyroidism. 7. Hyperlipidemia. 8. Chronic anemia with thrombocytopenia. 9. Sacral wounds. 10.Chronic loose stools with history of cytomegalovirus colitis.  HISTORY OF PRESENT ILLNESS:  This is a 80 year old right-handed female with history of myasthenia gravis on CellCept, uterine cancer, DVT with IVC filter, and GI bleed.  The patient well known to rehab Services from admissions on March 26, 2013, for deconditioning related to incarcerated hernia.  The patient had been living with her daughter and son-in-law prior to admission using a walker for ambulation.  Admitted on June 11, 2013, with progressive weakness over 2 days involving lower extremities with numbness.  MRI of the brain negative for acute changes.  MRI of cervical and thoracic spine unremarkable.  MRI lumbar spine with no neural impingement.  Neurology consulted.  Possibilities included myasthenia gravis causing lower extremity fatigability versus possible GBS, placed on IVIG x5 days.  Venous Doppler studies, bilateral lower extremity showed DVT common femoral, profunda, femoral, popliteal, posterior tibial and peroneal veins.  An IVC filter was in place. Oncology Services was consulted for thrombocytopenia question HIT, was ruled out, and platelets continued to trend upwards.  The patient with sacral wounds, air mattress overlay in place, bouts of diarrhea noted limited appetite.  Physical and  occupational therapy ongoing.  The patient was admitted for comprehensive rehab program.  PAST MEDICAL HISTORY:  See discharge diagnoses.  SOCIAL HISTORY:  Lives with family.  FUNCTIONAL HISTORY:  Prior to admission, she used a walker.  FUNCTIONAL STATUS:  Upon admission to rehab services was ambulating 8 feet with rolling walker, easily fatigued, needed much encouragement.  PHYSICAL EXAMINATION:  VITAL SIGNS:  Blood pressure 129/52, pulse 66, temperature 97.8, respirations 18. GENERAL:  This was a lethargic female.  She was oriented to person, pupils reactive to light. LUNGS:  Decreased breath sounds.  Clear to auscultation. CARDIAC:  Regular rate and rhythm. ABDOMEN:  Soft, nontender.  Good bowel sounds.  She had few patches of healing round lesions around the perineal area.  [place] COURSE:  The patient was admitted to inpatient rehab services with therapies initiated on a 3-hour daily basis consisting of physical therapy, occupational therapy, and rehabilitation nursing.  The following issues were addressed during the patient's rehabilitation stay.  Pertaining to Ms. [name] suspect Guillain-Barre syndrome, myasthenia gravis.  She remained on CellCept. She had completed a course of IVIG per Neurology Services.  An IVC filter was in place for history of DVT.  Pain management with the use of Lidoderm patch and oxycodone for breakthrough pain.  The patient with history of anxiety and depression.  She remained on Wellbutrin.  Hormone supplement ongoing for hypothyroidism.  She did have some sacral wounds, felt to be possibly herpetic  with generalized wound care.  HOSPITAL COURSE:  Rehab complicated by urinary tract infection and placed on Rocephin.  Decreased nutritional storage.  Chronic loose stools with followup per Gastroenterology services.  Maintained on probiotic as well as scheduled Imodium.  A rectal tube had been in placed.  C. diff was negative.   Maintained on fiber.  Family did not wish for any further workup for her diarrhea.  There was some question of possibly her CellCept contributing to this.  The patient had  very minimal gains during her rehab stay.  Palliative Care was consulted for her comfort care.  The patient was a do not resuscitate.  It was the feeling of the family that hospice care would be needed and they were in full agreement of this.  She had been on intravenous fluids it was advised once bed available for hospice that these would be discontinued. All issues in regards to her care were discussed with family.  Her therapies were limited due to limited endurance, overall fatigue, and bouts of ortho static hypotension maintained on IV fluids.  DISCHARGE MEDICATIONS:  Dysphagia 1 thin liquid diet, Xanax 0.25 mg p.o. t.i.d. as needed, aspirin 81 mg p.o. daily, Wellbutrin XL 150 mg p.o. daily, Lomotil 10 mL p.o. 4 times daily, Lactobacillus 1 g p.o. t.i.d. with meals, Synthroid 125 mcg p.o. daily, Lidoderm patch removed every 12 hours, oxycodone concentrated solution 5 mg p.o. every 4 hours as needed moderate pain, Protonix 40 mg p.o. daily, FiberCon 625 mg p.o. b.i.d., and Compazine 10 mg p.o. t.i.d.  SPECIAL INSTRUCTIONS:  Apply silicone foam to the open wounds of the sacrum, change every 3 days as needed.  Foley catheter tube inserted.     [name], P.A.   ______________________________ [name], M.D.    DA/MEDQ  D:  07/05/2013  T:  07/06/2013  Job:  [id number]

## 2013-07-06 NOTE — Progress Notes (Signed)
Palliative Medicine Team Progress Note  Patient declining. She is hypotensive and developing upper airway congestion. I have spoken with [facility] do have bed availability today. I have given report to the admission nurse.  1. Maintain Foley at discharge for comfort 2. Maintain Flexiseal for comfort 3. De-Access Port-a-Cath 4. Place scop patch 5. Continue Oxyfast for Pain sublingual 6. Gold DNR on Chart  Discharge order placed. D/C has already been completed by Inpatient Rehab Attending. IV antibiotics and IV fluids have been discontinued.  I have updated daughter and she will sign concents at hospice house. Daughter requesting that patient be told she is going to a  "comfort care home"-she is worried that he mother may think they are sending her to a nursing home. I provided support and reassurance.  Lane Hacker, DO Palliative Medicine

## 2013-07-06 NOTE — Progress Notes (Signed)
Physical Therapy Discharge Summary  Patient Details  Name: [name] MRN: [id number] Date of Birth: March 16, 1925  Today's Date: 07/06/2013  Patient has met 3 of 4 long term goals. Pt has demonstrated unfortunate decline while on unit due to multiple medical complications. Pt is now moving to a palliative care unit. Family has desired to continue PT these last few days on rehab to work on pressure relief and comfort techniques including positioning of pt. Pt's daughter was present yesterday and was fully educated on positioning and pressure relief (rolling). She had requested therapy work with her other sister however she has not been able to participate. I called pt's daughter "[name]" who reports she is independent with positioning and verbalized all the positioning techniques discussed yesterday without prompting, [name] reports no further questions.  Patient to discharge at dependent level Total Assist.   Patient's care partner requires assistance to provide the necessary physical assistance at discharge, palliative care facility is d/c plan.  Reasons goals not met: Pt has had significant decline medically and functionally while on unit, goals have been updated to reflect change in plan of care and family wishes however pt unable to meet rolling goal of mod assist and today required max assist.    Recommendation:  Patient will not benefit from ongoing skilled PT services, plan is for comfort care in palliative care setting.   Equipment: No equipment provided  Reasons for discharge: change in medical status and discharge from hospital  Patient/family agrees with progress made and goals achieved: Yes  PT Discharge Precautions/Restrictions Precautions Precautions: Fall Precaution Comments: Very orthostatic Restrictions Weight Bearing Restrictions: No Vital Signs Therapy Vitals Pulse Rate: 74 BP: 66/47 mmHg Patient Position, if appropriate: Lying Pain Pain Assessment Pain Score: 0-No  pain Faces Pain Scale: No hurt  Cognition Overall Cognitive Status: Impaired/Different from baseline Arousal/Alertness: Lethargic Orientation Level: Oriented to person;Other (comment) (difficulty vocalizing other orientation responses) Comments: Pt unable to appropriately respond to questioning due to lethargy, unable to appropriately assess current cognitive level.  Sensation Sensation Light Touch: Impaired Detail Light Touch Impaired Details: Impaired LLE;Impaired RLE Additional Comments: Long h/o impaired sensation in bil LEs Coordination Gross Motor Movements are Fluid and Coordinated: No Fine Motor Movements are Fluid and Coordinated: No Coordination and Movement Description: Pt now with jerking movements, decreased coordination.   Mobility Bed Mobility Bed Mobility: Rolling Right;Rolling Left;Scooting to [facility] Rolling Right: 2: Max assist Rolling Left: 2: Max assist Scooting to HOB: 1: +2 Total assist Scooting to [facility] Details (indicate cue type and reason): Pt able to bring UEs to chest but unable to assist with sliding to [facility].  Transfers Transfers: Yes (deemed unappropriate due to cognition, low BP, and overall plan of care) Locomotion  Ambulation Ambulation: No (pt unable to attempt)  Trunk/Postural Assessment  Postural Control Postural Control:  (unable to asses due to functional decline)  Balance Dynamic Sitting Balance Sitting balance - Comments: Unable to assess today due to functional decline however two days ago pt able to tolerate sitting EOB with mod/max assist for 12 min.   Extremity Assessment      RLE Strength RLE Overall Strength Comments: Pt regained good strength during stay however functionally declined significantly over past week and today she was unable to tolerate MMT however is able to actively move bil LEs against gravity.  LLE Assessment LLE Assessment: Exceptions to [facility] LLE Strength LLE Overall Strength Comments: Pt regained good strength during  stay however functionally declined significantly over past week and today she  was unable to tolerate MMT however is able to actively move bil LEs against gravity.   See FIM for current functional status  Lahoma Rocker 07/06/2013, 12:42 PM

## 2013-07-06 NOTE — Progress Notes (Signed)
Subjective/Complaints: Opens eyes briefly to voice, follows simple commands such as gripping   A 12 point review of systems has been performed and if not noted above is otherwise negative.   Objective: Vital Signs: Blood pressure 66/47, pulse 74, temperature 97.1 F (36.2 C), temperature source Axillary, resp. rate 16, height 5\' 4"  (1.626 m), weight 65.1 kg (143 lb 8.3 oz), SpO2 95.00%. No results found.  Recent Labs  07/04/13 0900  WBC 5.3  HGB 9.7*  HCT 27.8*  PLT 101*    Recent Labs  07/04/13 0900  NA 131*  K 4.7  CL 106  GLUCOSE 65*  BUN 38*  CREATININE 1.33*  CALCIUM 7.4*   CBG (last 3)  No results found for this basename: GLUCAP,  in the last 72 hours  Wt Readings from Last 3 Encounters:  07/05/13 65.1 kg (143 lb 8.3 oz)  06/17/13 66.9 kg (147 lb 7.8 oz)  05/31/13 70.761 kg (156 lb)    Physical Exam:  Constitutional: She is oriented to person, place. Room smells of stool Frail 80 year old   HENT:  Head: Normocephalic.  Eyes: EOM are normal.  Neck: Normal range of motion. Neck supple. No thyromegaly present.  Cardiovascular: Normal rate and regular rhythm. No murmurs. No gallops  Respiratory: Effort normal and breath sounds normal. No respiratory distress. No wheezes, rales  GI: Soft. Bowel sounds are normal. She exhibits no distension. Rectal tube still with soft, liquid stool Neurological:  . Slow to arouse this am.   Follows simple commands inconsistently . UE 4-prox with deltoid, bicep to 4 distally. LE's 1 to 1+ with LEFt HF (pain component), RIGHT HF 3. KE 2- left, 2+ right, ankles 3/5. Decreased LT at both feet ,no ptosis, no dsyarthria, CN exam intact  Skin: stable gluteal/perineal/waist line with a few patches of healing round lesions. Appear to be resolving herpetic lesions.  Marland Kitchen Psychiatric: affect very flat.  confused this am..      Assessment/Plan: 1. Functional deficits secondary to Guillain Barre Syndrome Not able to do any therapy  secondary to mental status and hypotension.   Therapy reduced to QD due to poor tolerance  Appreciate Palliative care assistance.  Hospice transfer pending , discussed with Dr. Hilma Favors on unit today, will transfer [facility]  Family has talked to Dr. Hilma Favors , transport needed    FIM: FIM - Bathing Bathing Steps Patient Completed: Chest;Right Arm;Left Arm;Abdomen;Right upper leg;Left upper leg Bathing: 1: Total-Patient completes 0-2 of 10 parts or less than 25%  FIM - Upper Body Dressing/Undressing Upper body dressing/undressing steps patient completed: Thread/unthread right sleeve of pullover shirt/dresss;Thread/unthread left sleeve of pullover shirt/dress;Put head through opening of pull over shirt/dress Upper body dressing/undressing: 0: Activity did not occur FIM - Lower Body Dressing/Undressing Lower body dressing/undressing steps patient completed: Don/Doff right sock Lower body dressing/undressing: 1: Total-Patient completed less than 25% of tasks  FIM - Toileting Toileting steps completed by patient: Performs perineal hygiene Toileting Assistive Devices: Grab bar or rail for support Toileting: 3: Mod-Patient completed 2 of 3 steps  FIM - Radio producer Devices: Elevated toilet seat Toilet Transfers: 0-Activity did not occur  FIM - Control and instrumentation engineer Devices: Bed rails Bed/Chair Transfer: 1: Mechanical lift  FIM - Locomotion: Wheelchair Locomotion: Wheelchair: 1: Travels less than 50 ft with minimal assistance (Pt.>75%) FIM - Locomotion: Ambulation Locomotion: Ambulation Assistive Devices: Administrator Ambulation/Gait Assistance: 1: +2 Total assist Locomotion: Ambulation: 1: Two helpers  Comprehension Comprehension Mode: Auditory Comprehension: 3-Understands basic 50 - 74% of the time/requires cueing 25 - 50%  of the time  Expression Expression Mode: Verbal Expression: 3-Expresses  basic 50 - 74% of the time/requires cueing 25 - 50% of the time. Needs to repeat parts of sentences.  Social Interaction Social Interaction: 2-Interacts appropriately 25 - 49% of time - Needs frequent redirection.  Problem Solving Problem Solving: 1-Solves basic less than 25% of the time - needs direction nearly all the time or does not effectively solve problems and may need a restraint for safety  Memory Memory: 2-Recognizes or recalls 25 - 49% of the time/requires cueing 51 - 75% of the time Medical Problem List and Plan:  1. Suspect GBS. (? Myasthenia gravis). IVIG completed. Continue CellCept  2. DVT Prophylaxis/Anticoagulation: History of DVT with IVC filter  3. Pain Management: Hydrocodone as needed. Monitor with increased mobility  -added lidoderm patch for left low back/hip pain---suspect pain is from lumbar spondylosis/referral  3. Mood/anxiety/depression: Wellbutrin 150 mg daily, Xanax 0.25 mg twice daily as needed and 1 mg each bedtime as needed   --increased confusion noted, megace stopped  -UA +, CTX IV started, continue for now, culture pending  -TSH elevated---resumed levothyroxine. Remaining labs fairly unremarkable  4. Neuropsych: This patient is capable of making decisions on her own behalf.  5. Hypothyroidism. Synthroid  6. Hyperlipidemia. Lipitor  7. Chronic anemia/thrombocytopenia.    iron supplement held.  followup CBC  8. Sacral wounds. Appreciate WOC input and recs -  likely herpetic lesions/ HSV   -air mattress  9. Chronic loose stool: more severe during this hospitalization   -probiotic  -imodium 4mg  qid  -rectal tube   -stopped iron and multivit due to nausea  -c diff negative  -continue fiber and colestid  -GI consult appreciated. ?flex-sig---to r/o CMV colitis--pt is willing to pursue but not sure how appropriate it is at this point.  -cellcept contribution, don't see utility in continuing this med given medical status      10. FEN  -continue IVF  at 50cc /hr,  Palliative to manage further orders on this           11. Uro:  foley for comfort .  LOS (Days) 17 A FACE TO FACE EVALUATION WAS PERFORMED  Charlett Blake 07/06/2013 10:25 AM

## 2013-07-06 NOTE — Progress Notes (Signed)
Pt d/c'd to [facility] in [place] at 1500 transported by EMS. Denied pain prior to d/c. All belongings sent with pt. Dtr [name] called and informed that pt was en route to facility. Hortencia Conradi RN.

## 2013-07-08 LAB — URINE CULTURE

## 2013-07-09 NOTE — Progress Notes (Signed)
Occupational Therapy Discharge Summary  Patient Details  Name: [name] MRN: [id number] Date of Birth: June 24, 1924  Today's Date: 07/09/2013  Patient has met 4 of 7 long term goals due to ability to compensate for deficits.  Patient to discharge at [facility] Max Assist level.  Patient's care partner is independent to provide the necessary physical and cognitive assistance at discharge.    Reasons goals not met:  Deteriorating medical status  Recommendation:  Patient will benefit from ongoing skilled OT services in home health setting to continue to advance functional skills in the area of BADL.  Equipment: No equipment provided  Reasons for discharge: change in medical status  Patient/family agrees with progress made and goals achieved: Yes  OT Discharge Precautions/Restrictions  Precautions Precautions: Fall Precaution Comments: orthotics Restrictions Weight Bearing Restrictions: No  Pain Pain Assessment Pain Assessment: No/denies pain  ADL ADL ADL Comments: see FIM  Vision/Perception  Vision - History Baseline Vision: Wears glasses only for reading Patient Visual Report: No change from baseline Vision - Assessment Eye Alignment: Within Functional Limits Perception Perception: Within Functional Limits Praxis Praxis: Intact   Cognition Overall Cognitive Status: Impaired/Different from baseline Arousal/Alertness: Lethargic Orientation Level: Oriented to person;Other (comment) Attention: Selective Focused Attention: Appears intact Sustained Attention: Appears intact Selective Attention: Impaired Selective Attention Impairment: Verbal complex;Functional complex Awareness: Appears intact Awareness Impairment: Anticipatory impairment Problem Solving: Appears intact Problem Solving Impairment: Verbal complex;Functional complex Organizing: Appears intact Decision Making: Appears intact Safety/Judgment: Appears intact  Sensation Sensation Light Touch:  Impaired Detail Light Touch Impaired Details: Impaired LLE;Impaired RLE Stereognosis: Appears Intact Hot/Cold: Appears Intact Proprioception: Appears Intact Additional Comments: Long h/o impaired sensation in bil LEs Coordination Gross Motor Movements are Fluid and Coordinated: No Fine Motor Movements are Fluid and Coordinated: No Coordination and Movement Description: declining functional status with ongoing tremors and fine motor incoordination  Motor  Motor Motor: Other (comment) Motor - Skilled Clinical Observations:  (unable to assess due to progressive deterioration of functional status)  Mobility  Bed Mobility Bed Mobility: Rolling Right;Rolling Left;Scooting to [facility] Rolling Right: 2: Max assist Rolling Left: 2: Max assist Supine to Sit: 2: Max assist Scooting to HOB: 1: +2 Total assist Transfers Transfers: Not assessed Sit to Stand: 1: +2 Total assist   Balance Dynamic Sitting Balance Sitting balance - Comments: Unable to assess today due to functional decline however two days ago pt able to tolerate sitting EOB with mod/max assist for 12 min.    Extremity/Trunk Assessment RUE Assessment RUE Assessment: Exceptions to [facility] RUE Strength RUE Overall Strength: Unable to assess;Due to impaired cognition LUE Assessment LUE Assessment: Exceptions to [facility] LUE Strength LUE Overall Strength: Deficits;Due to impaired cognition  See FIM for current functional status  [place] 07/09/2013, 11:23 AM

## 2013-07-09 NOTE — Progress Notes (Signed)
During this admission this patient was severely malnourished related to her chronic diarrhea and GI issues.   [name], MD, [place]

## 2013-07-21 NOTE — Progress Notes (Signed)
Social Work  Discharge Note The overall goal for the admission was met for:   Discharge location: No - plan was changed to [place] due to terminal decline  Length of Stay: Yes  Discharge activity level: No - medical/ terminal decline  Home/community participation: No  Services provided included: MD, RD, PT, OT, RN, TR, Pharmacy and SW  Financial Services: Medicare and Private Insurance: [place]  Follow-up services arranged: Other: [place]  Comments (or additional information):  Patient/Family verbalized understanding of follow-up arrangements: Yes  Individual responsible for coordination of the follow-up plan: daughters  Confirmed correct DME delivered: NA    Kimber Esterly

## 2013-07-21 DEATH — deceased

## 2013-10-16 ENCOUNTER — Ambulatory Visit: Payer: Medicare Other | Admitting: Nurse Practitioner

## 2015-04-27 IMAGING — XA IR FLUORO GUIDE CV LINE*R*
4 series · 7 of 7 positions shown · non-contrast
Comparison: none

TUNNELED CENTRAL VENOUS CATHETER WITH SUBCUTANEOUS RESERVOIR
(PORTACATH) PLACEMENT WITH ULTRASOUND AND FLUOROSCOPIC  GUIDANCE

Date: 01/29/2013
CLINICAL HISTORY: 88-year-old female with a poor peripheral IV
access and frequent need for intravenous medications and peripheral
blood draws.  Benefits of PICC versus port placement were discussed
with the patient and family.  Portacatheter is preferred secondary
to perceives long-term need.

[Series 1: care single · 1 of 1 slices shown (1 of 2)]
[im 1/1]
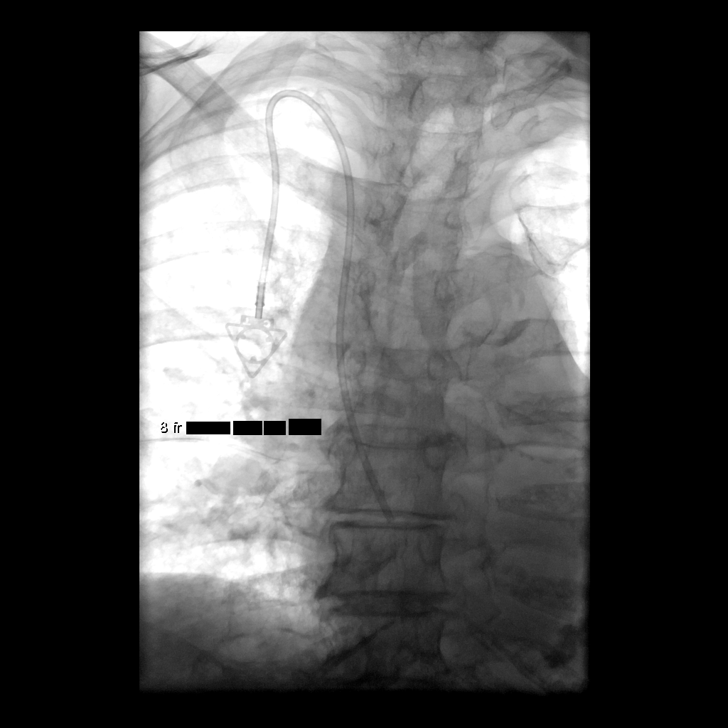

[Series 2: care single · 1 of 1 slices shown (2 of 2)]
[im 1/1]
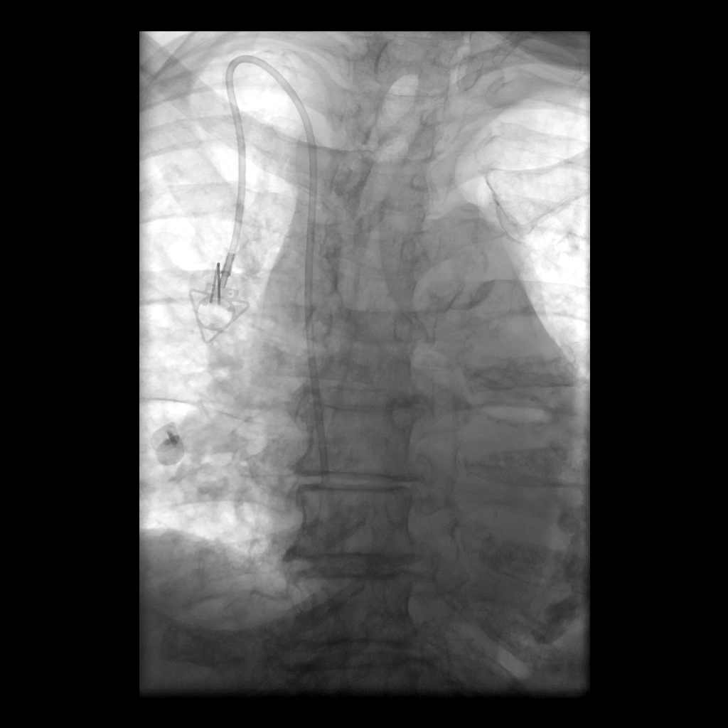

[Series 3: shuntogram · 4 of 18 frames shown]
[frame 3/18]
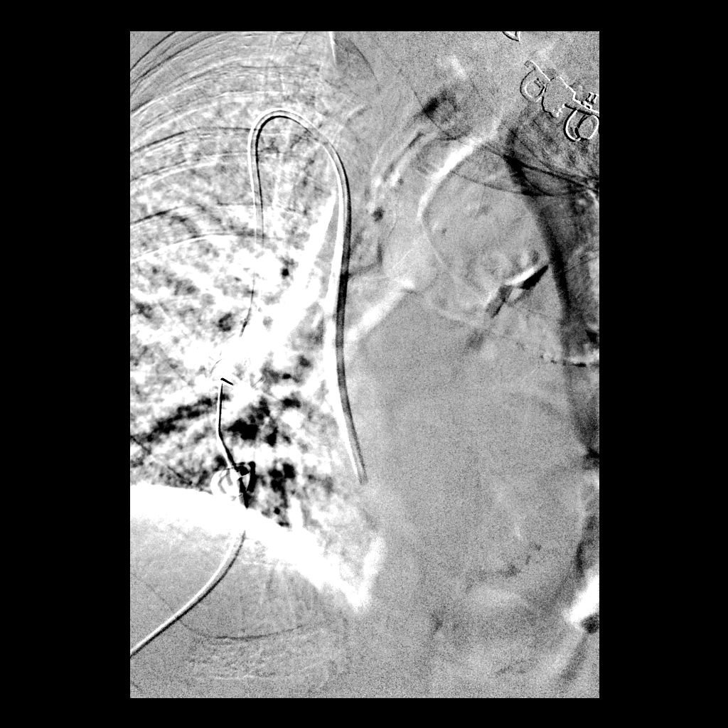
[frame 10/18]
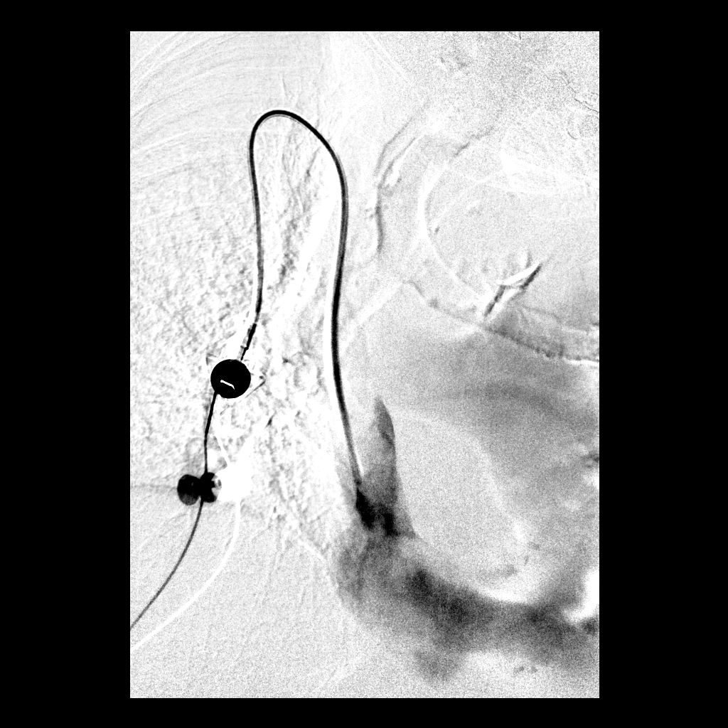
[frame 16/18]
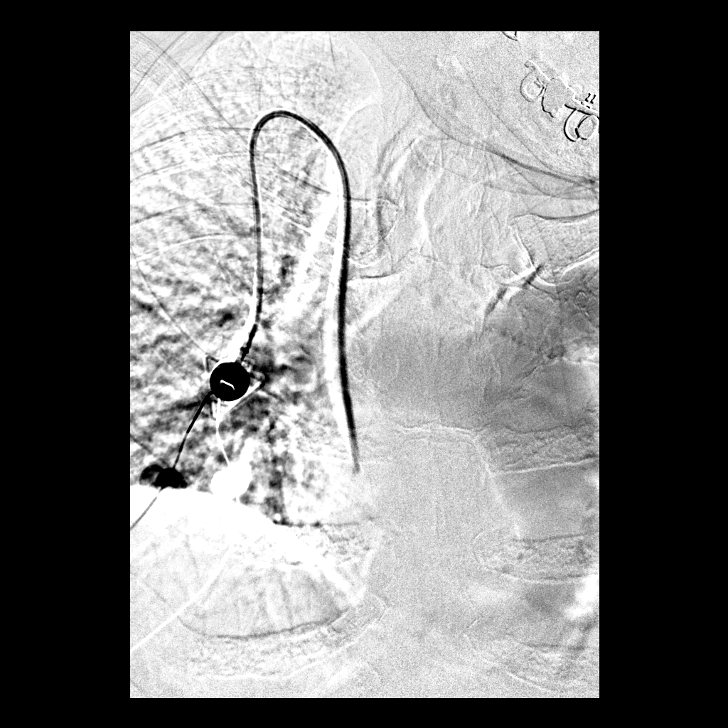
[frame 18/18]
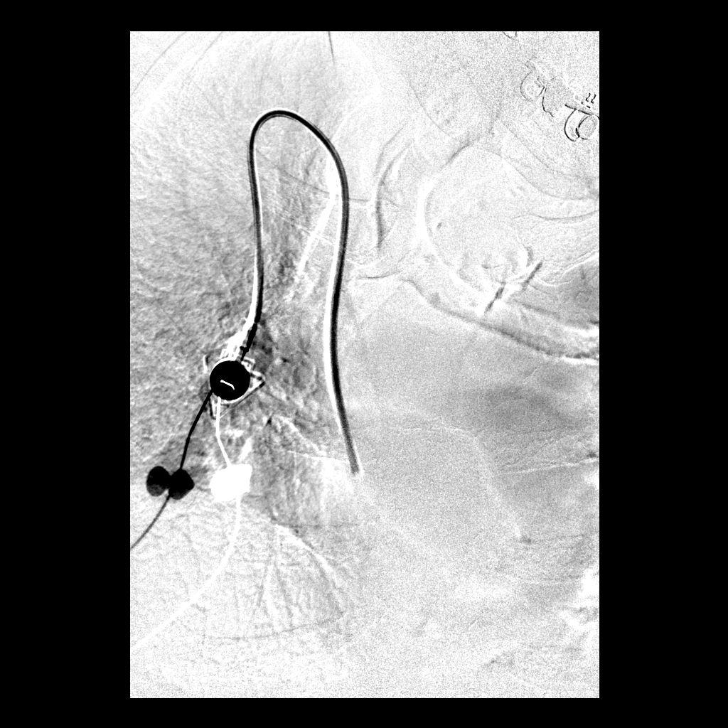

[Series 300: line placements · 1 of 1 slices shown]
[im 1/1]
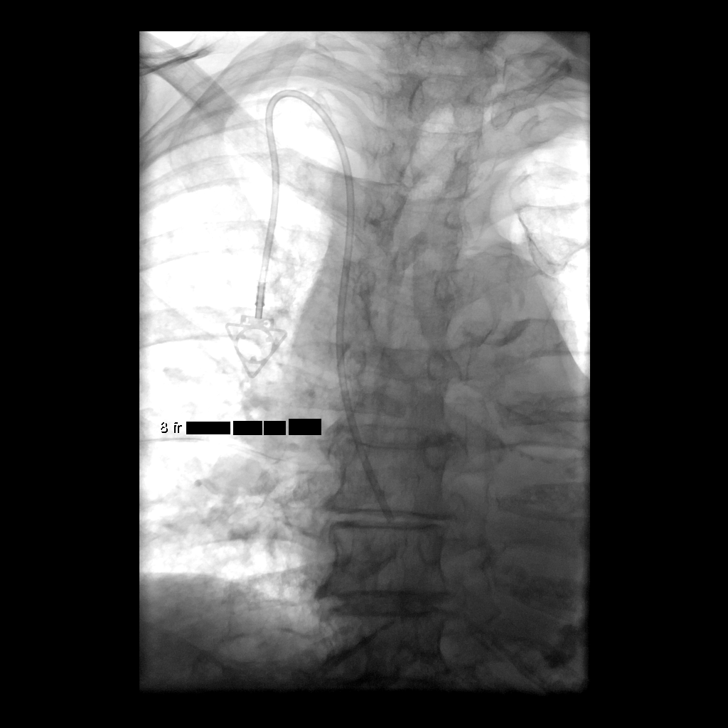

[7 of 7 positions shown; findings below may reference images not displayed]

Sedation: Moderate (conscious) sedation was administered during
this procedure.  A total of twomg Versed and 100mg Fentanyl were
administered intravenously.  The patient's vital signs were
monitored continuously by radiology nursing throughout the course
of the procedure.

Total sedation time: 25 minutes

Fluoroscopy Time: 18 seconds minutes.

Procedure:

The right neck and chest was prepped with chlorhexidine, and draped
in the usual sterile fashion using maximum barrier technique (cap
and mask, sterile gown, sterile gloves, large sterile sheet, hand
hygiene and cutaneous antiseptic).  Antibiotic prophylaxis was
provided with 2g Ancef administered IV one hour prior to skin
incision.  Local anesthesia was attained by infiltration with 1%
lidocaine with epinephrine.

Ultrasound demonstrated patency of the right internal jugular vein,
and this was documented with an image.  Under real-time ultrasound
guidance, this vein was accessed with a 21 gauge micropuncture
needle and image documentation was performed.  A small dermatotomy
was made at the access site with an 11 scalpel.  A 0.018" wire was
advanced into the SVC and the access needle exchanged for a 4F
micropuncture vascular sheath.  The 0.018" wire was then removed
and a 0.035" wire advanced into the IVC.



The venous access site was then serially dilated and a peel away
vascular sheath placed over the wire.  The wire was removed and the
port catheter advanced into position under fluoroscopic guidance.
The catheter tip is positioned in the superior cavoatrial junction.
This was documented with a spot image. The portacatheter was then
tested and found to flush and aspirate well.  The port was flushed
with saline followed by 100 units/mL heparinized saline.

The pocket was then closed in two layers using first subdermal
inverted interrupted absorbable sutures followed by a running
subcuticular suture.  The epidermis was then sealed with Dermabond.
The dermatotomy at the venous access site was also closed with a
single inverted subdermal suture and the epidermis sealed with
Dermabond.

Complications:  None.  The patient tolerated the procedure well.
IMPRESSION: Successful placement of a right IJapproach PowerPort with
ultrasound and fluoroscopic guidance.  The catheter is ready for
use.

[REDACTED]

## 2015-06-18 IMAGING — CR DG CHEST 2V
2 series · 2 of 2 positions shown · non-contrast
Comparison: None.

CLINICAL DATA: Nausea and vomiting

EXAM:
CHEST  2 VIEW

[w chest pa]
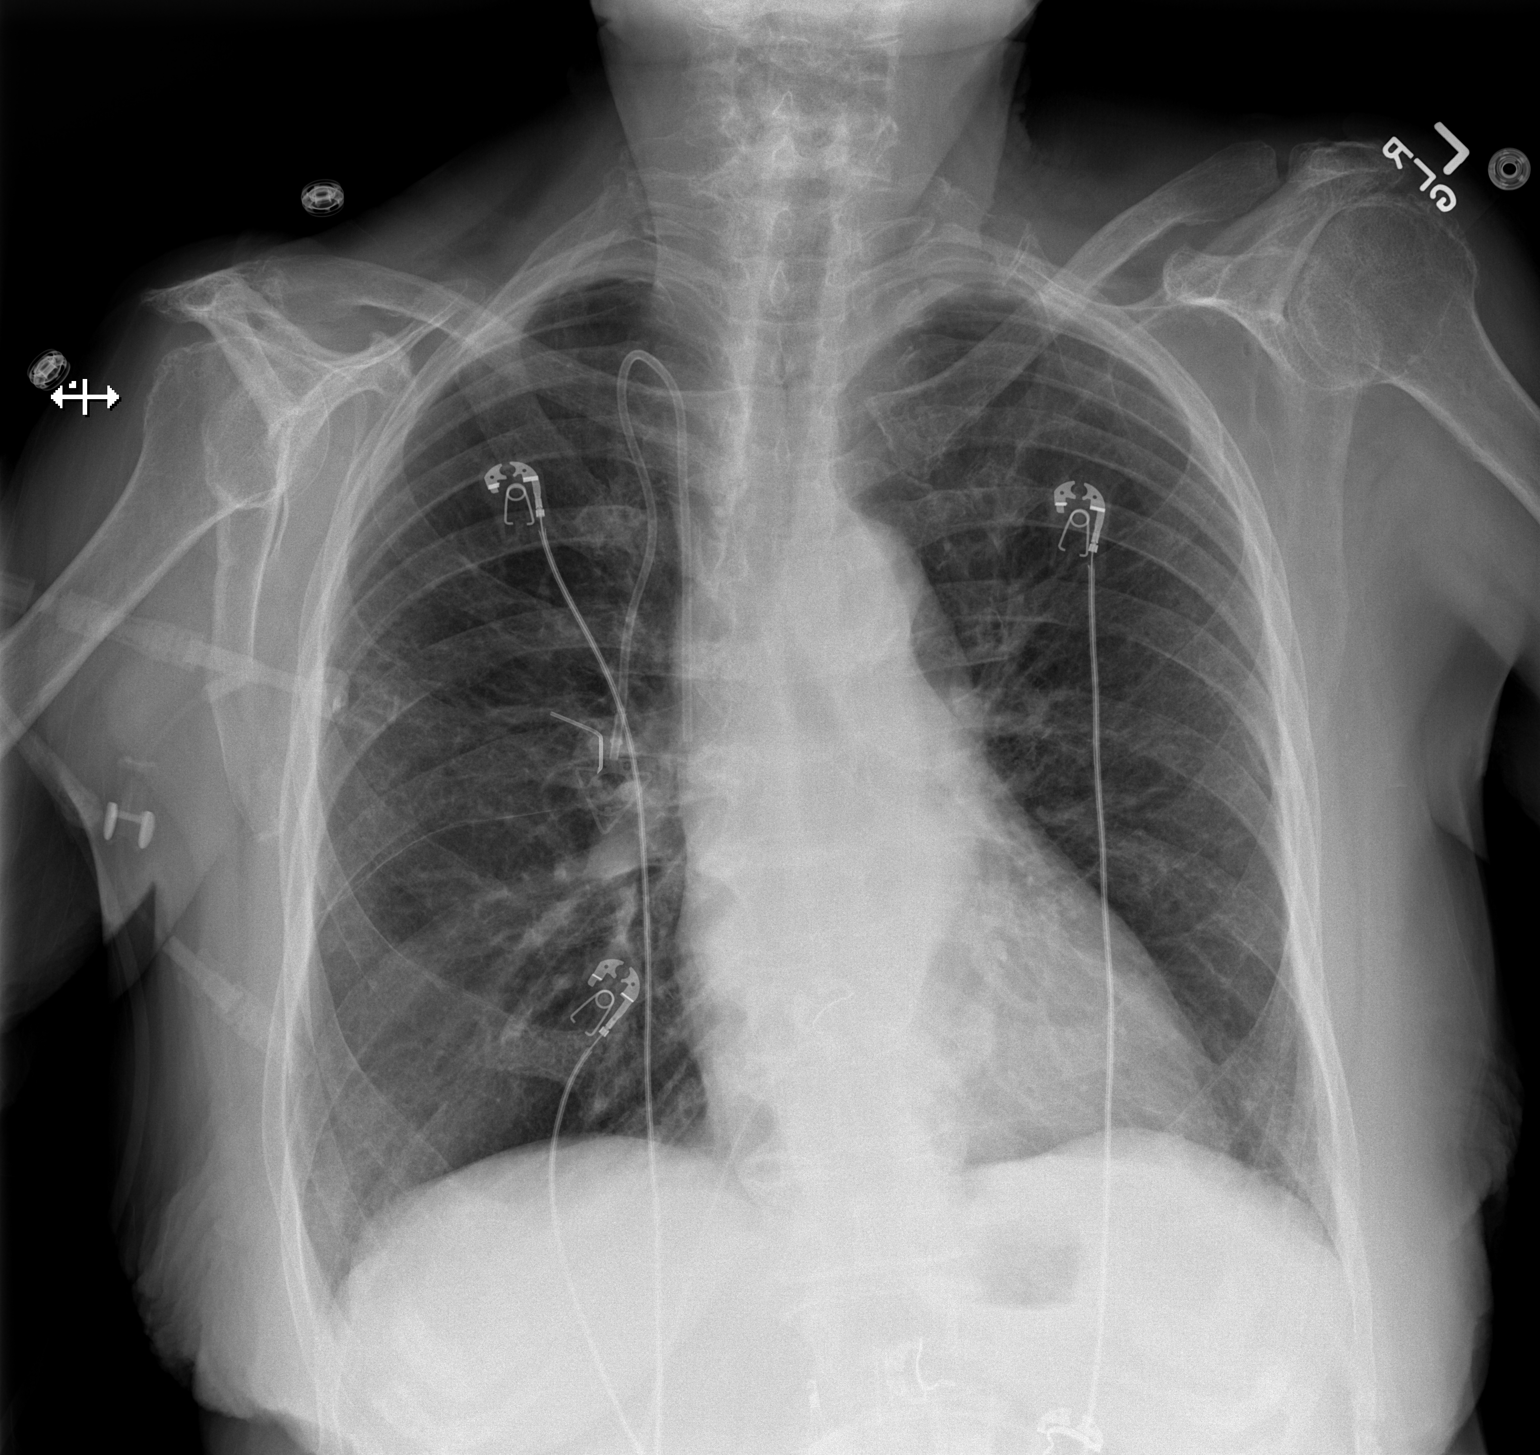

[w chest lat]
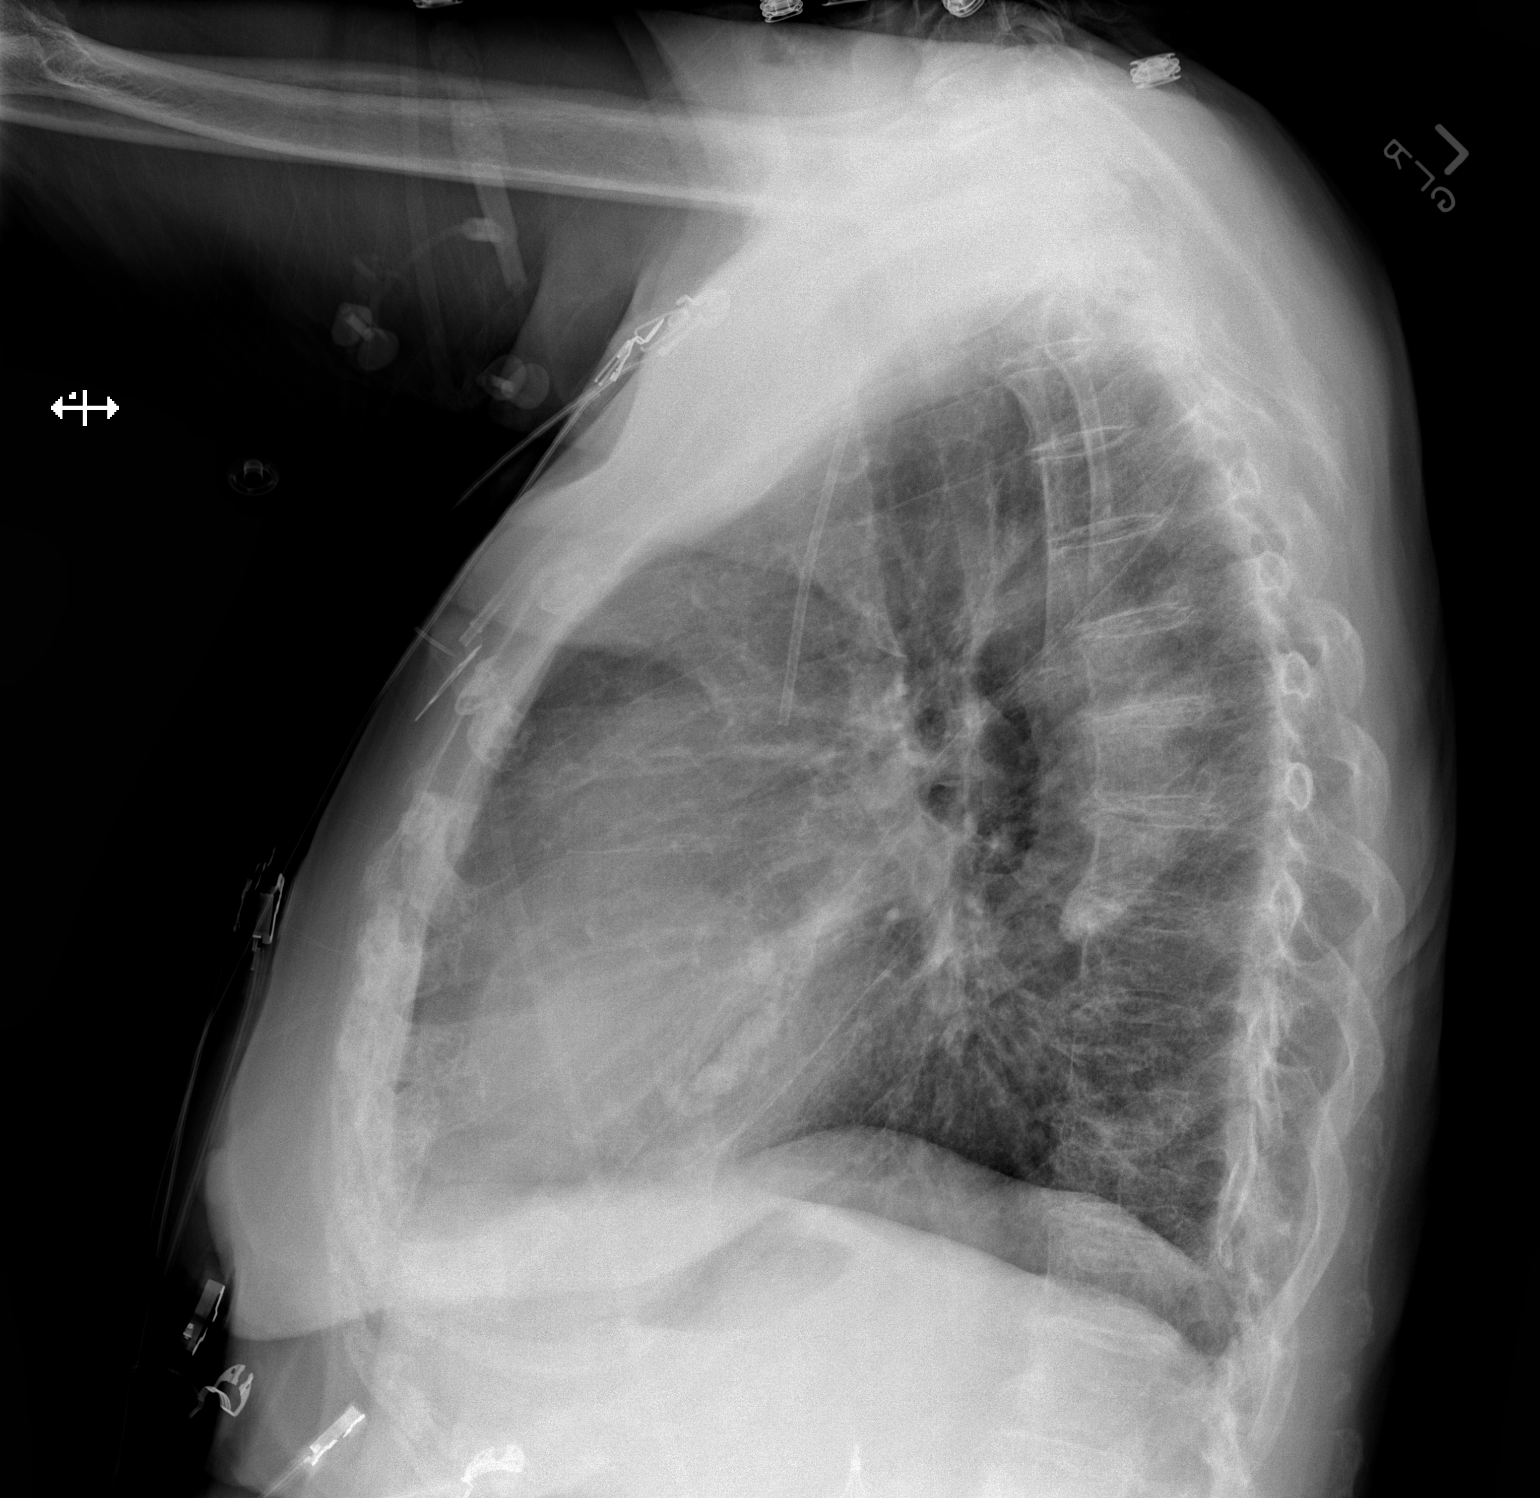

[2 of 2 positions shown; findings below may reference images not displayed]

FINDINGS: The cardiac shadow is within normal limits. The lungs are well
aerated bilaterally. No focal infiltrate or effusion is seen. A
right chest wall port is noted. No acute bony abnormality is noted.
IMPRESSION: No acute abnormality seen.

## 2015-06-21 IMAGING — RF DG CHOLANGIOGRAM OPERATIVE
1 series · 8 of 8 positions shown · non-contrast
Comparison: None.

CLINICAL DATA: Cholecystectomy for porcelain gallbladder.

EXAM:
INTRAOPERATIVE CHOLANGIOGRAM
TECHNIQUE: Cholangiographic images from the C-arm fluoroscopic device were
submitted for interpretation post-operatively. Please see the
procedural report for the amount of contrast and the fluoroscopy
time utilized.

[Series 1: run · 2 acquisitions, 8 frames shown]
[im 1/2]
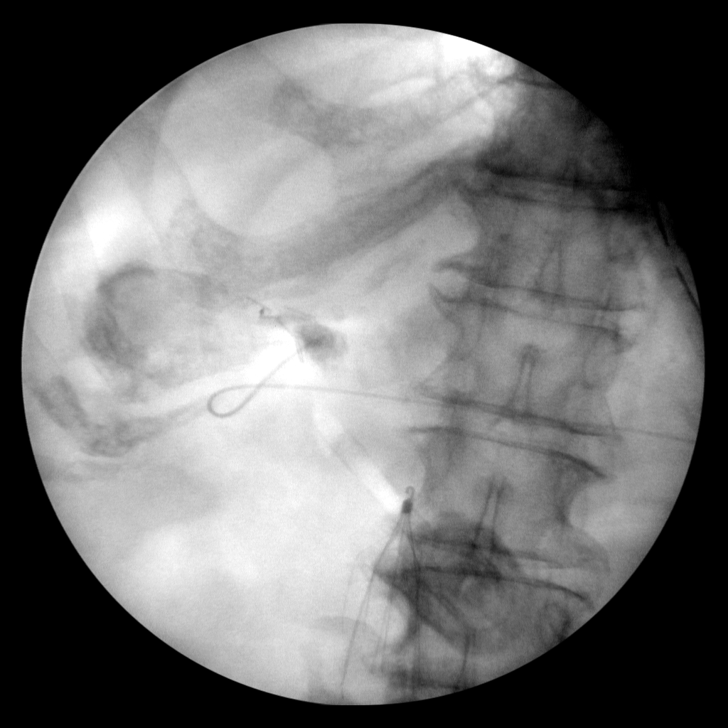
[im 1/2]
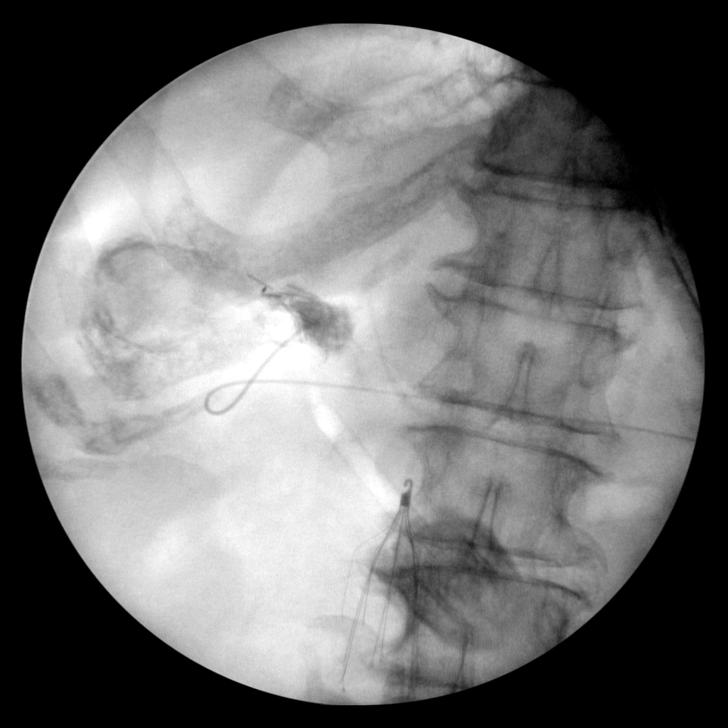
[im 1/2]
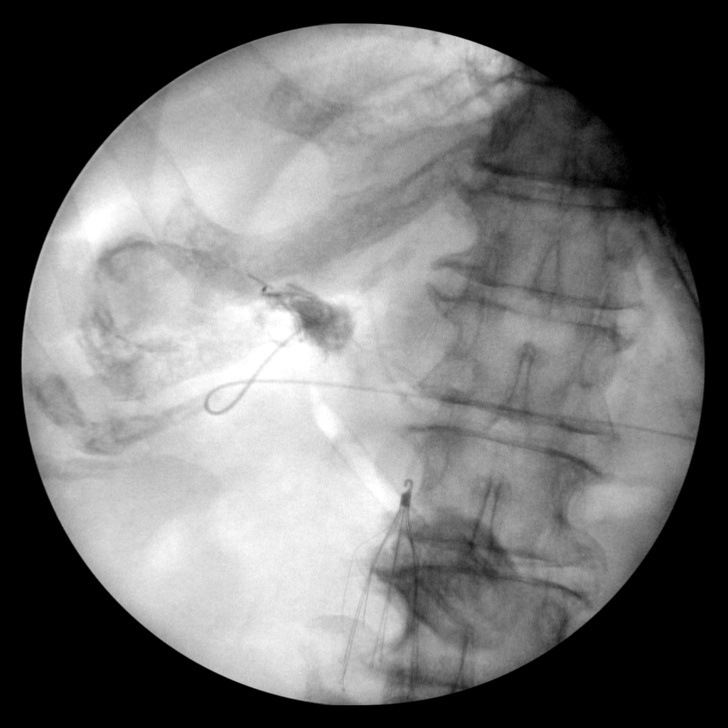
[im 1/2]
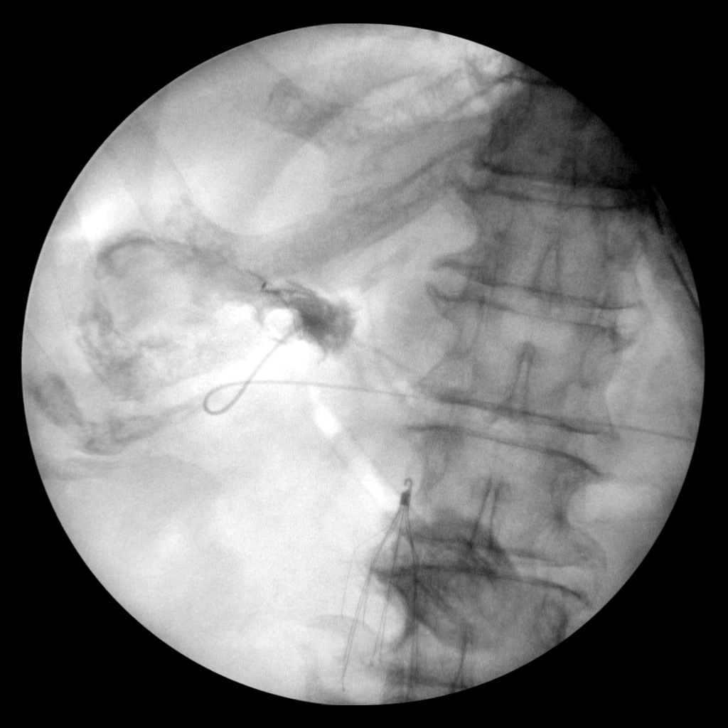
[im 2/2]
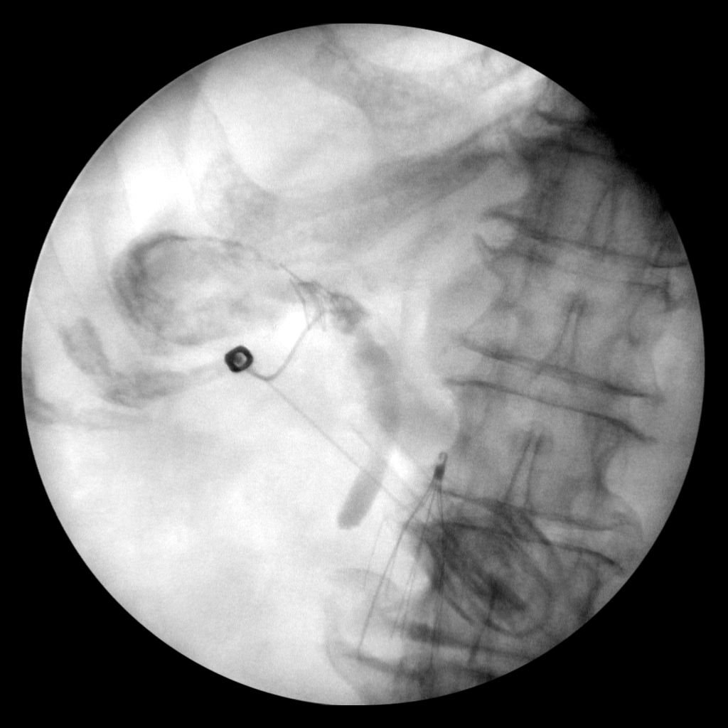
[im 2/2]
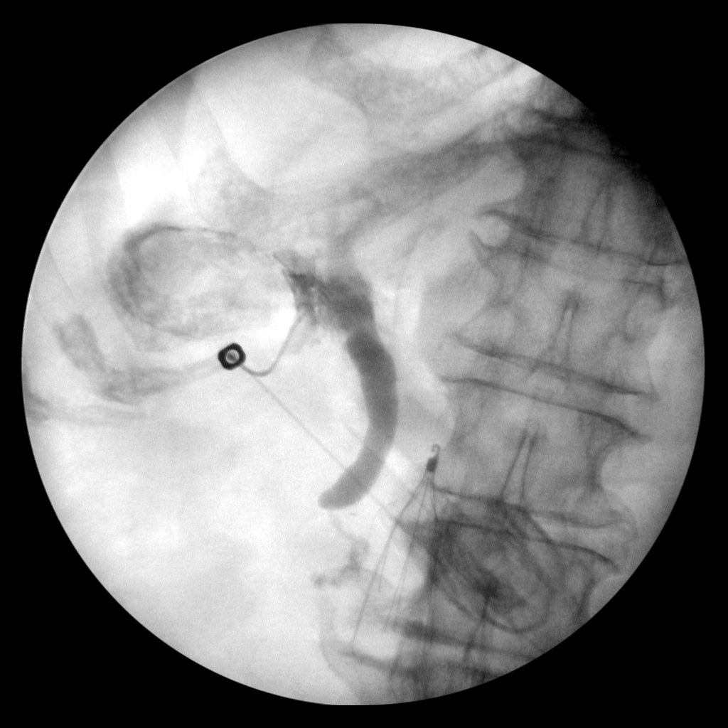
[im 2/2]
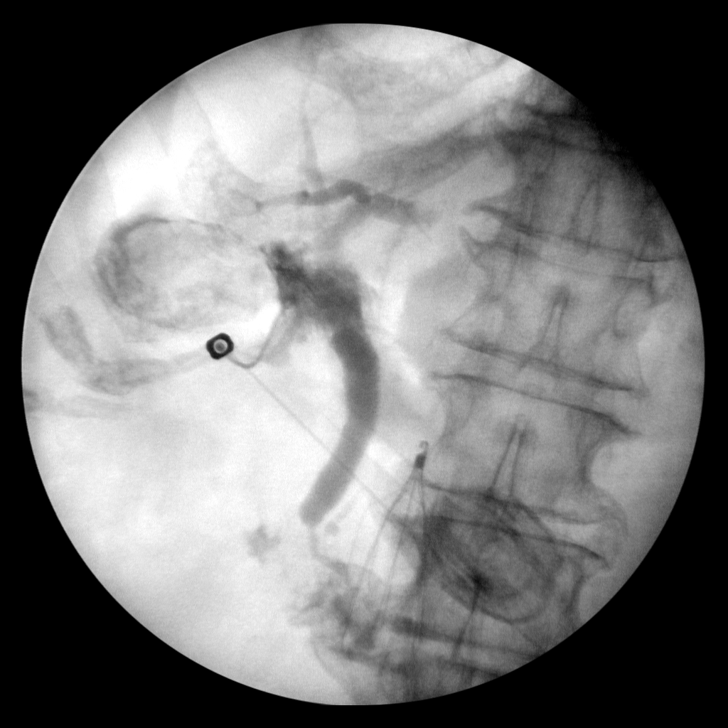
[im 2/2]
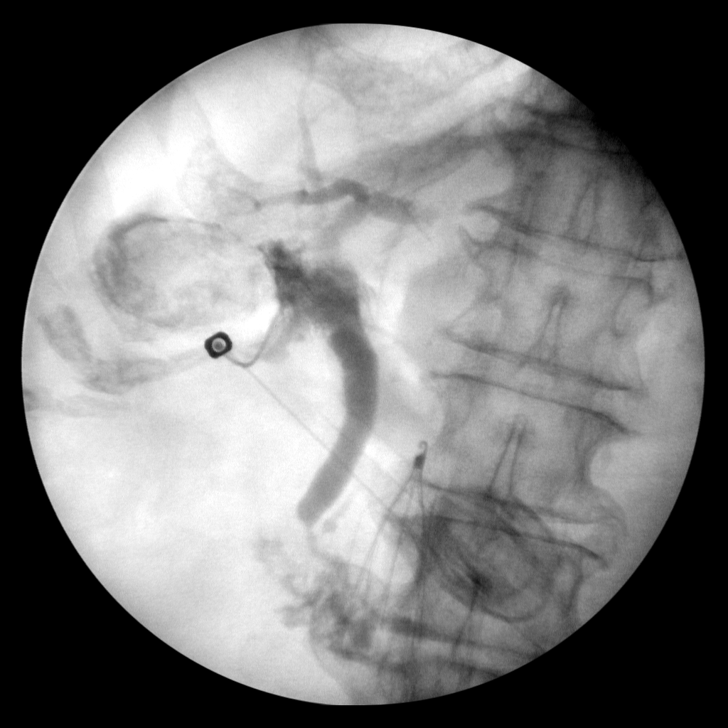

[8 of 8 positions shown; findings below may reference images not displayed]

FINDINGS: Intraoperative imaging with a C-arm demonstrates some initial
extravasation of injected contrast at the level of the cystic duct.
The biliary tree shows no evidence of filling defect or obstruction.
IMPRESSION: No filling defects identified by intraoperative cholangiography.

## 2015-06-23 IMAGING — CR DG ABD PORTABLE 1V
1 series · 1 of 1 positions shown · non-contrast
Comparison: 03/25/2013

CLINICAL DATA: Diarrhea, abdominal pain, recent cholecystectomy

EXAM:
PORTABLE ABDOMEN - 1 VIEW

[ap (kub)]
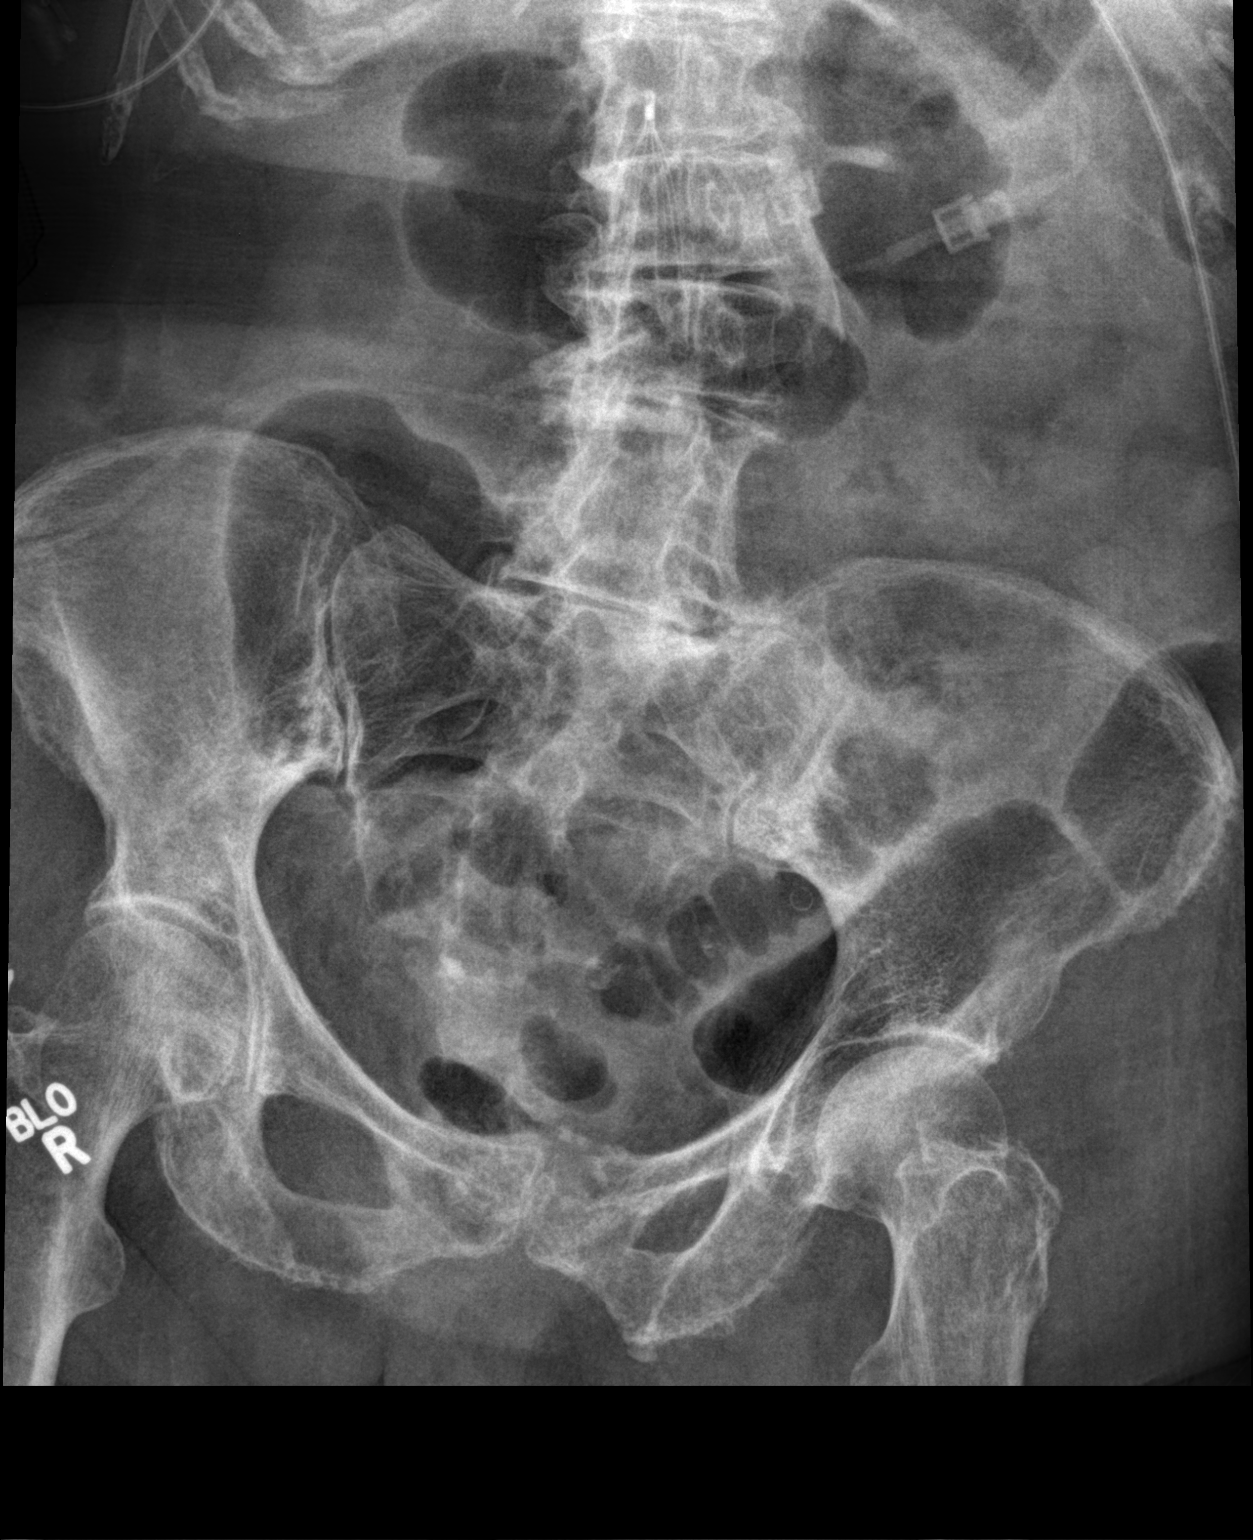

[1 of 1 positions shown; findings below may reference images not displayed]

FINDINGS: Scattered air and stool throughout the bowel. Negative for
obstruction or ileus. Degenerative changes of the spine with mild
scoliosis. IVC filter noted at the L3 level. Atherosclerosis of the
aorta. Bones are osteopenic. Degenerative changes of the pelvis and
hips.
IMPRESSION: Nonspecific mild gaseous distention of the bowel but no obstruction
or ileus.
